# Patient Record
Sex: Female | Born: 1940 | Race: Black or African American | Hispanic: No | Marital: Married | Smoking: Never smoker
Health system: Southern US, Community
[De-identification: ages and names within clinical notes are randomized; demographics above are authoritative.]

## PROBLEM LIST (undated history)

## (undated) DIAGNOSIS — Z853 Personal history of malignant neoplasm of breast: Secondary | ICD-10-CM

## (undated) DIAGNOSIS — C801 Malignant (primary) neoplasm, unspecified: Secondary | ICD-10-CM

## (undated) DIAGNOSIS — D219 Benign neoplasm of connective and other soft tissue, unspecified: Secondary | ICD-10-CM

## (undated) DIAGNOSIS — I10 Essential (primary) hypertension: Secondary | ICD-10-CM

## (undated) DIAGNOSIS — J449 Chronic obstructive pulmonary disease, unspecified: Secondary | ICD-10-CM

## (undated) DIAGNOSIS — E119 Type 2 diabetes mellitus without complications: Secondary | ICD-10-CM

## (undated) DIAGNOSIS — M899 Disorder of bone, unspecified: Secondary | ICD-10-CM

## (undated) DIAGNOSIS — M1990 Unspecified osteoarthritis, unspecified site: Secondary | ICD-10-CM

## (undated) DIAGNOSIS — M545 Low back pain: Secondary | ICD-10-CM

## (undated) DIAGNOSIS — K219 Gastro-esophageal reflux disease without esophagitis: Secondary | ICD-10-CM

## (undated) DIAGNOSIS — M7989 Other specified soft tissue disorders: Secondary | ICD-10-CM

## (undated) DIAGNOSIS — J309 Allergic rhinitis, unspecified: Secondary | ICD-10-CM

## (undated) DIAGNOSIS — E785 Hyperlipidemia, unspecified: Secondary | ICD-10-CM

## (undated) DIAGNOSIS — M949 Disorder of cartilage, unspecified: Secondary | ICD-10-CM

## (undated) DIAGNOSIS — R531 Weakness: Secondary | ICD-10-CM

## (undated) DIAGNOSIS — D649 Anemia, unspecified: Secondary | ICD-10-CM

## (undated) HISTORY — DX: Disorder of cartilage, unspecified: M94.9

## (undated) HISTORY — DX: Anemia, unspecified: D64.9

## (undated) HISTORY — DX: Chronic obstructive pulmonary disease, unspecified: J44.9

## (undated) HISTORY — DX: Benign neoplasm of connective and other soft tissue, unspecified: D21.9

## (undated) HISTORY — DX: Malignant (primary) neoplasm, unspecified: C80.1

## (undated) HISTORY — PX: TUBAL LIGATION: SHX77

## (undated) HISTORY — PX: EYE SURGERY: SHX253

## (undated) HISTORY — DX: Disorder of bone, unspecified: M89.9

## (undated) HISTORY — DX: Low back pain: M54.5

## (undated) HISTORY — DX: Unspecified osteoarthritis, unspecified site: M19.90

## (undated) HISTORY — DX: Essential (primary) hypertension: I10

## (undated) HISTORY — PX: COLONOSCOPY: SHX174

## (undated) HISTORY — PX: OTHER SURGICAL HISTORY: SHX169

## (undated) HISTORY — DX: Allergic rhinitis, unspecified: J30.9

## (undated) HISTORY — DX: Other specified soft tissue disorders: M79.89

## (undated) HISTORY — DX: Type 2 diabetes mellitus without complications: E11.9

## (undated) HISTORY — DX: Personal history of malignant neoplasm of breast: Z85.3

## (undated) HISTORY — DX: Hyperlipidemia, unspecified: E78.5

## (undated) HISTORY — DX: Weakness: R53.1

---

## 1986-06-30 HISTORY — PX: OOPHORECTOMY: SHX86

## 1986-06-30 HISTORY — PX: ABDOMINAL HYSTERECTOMY: SHX81

## 1997-12-04 ENCOUNTER — Other Ambulatory Visit: Admission: RE | Admit: 1997-12-04 | Discharge: 1997-12-04 | Payer: Self-pay | Admitting: Obstetrics and Gynecology

## 1998-05-31 ENCOUNTER — Encounter: Admission: RE | Admit: 1998-05-31 | Discharge: 1998-08-29 | Payer: Self-pay | Admitting: Family Medicine

## 1998-08-18 ENCOUNTER — Emergency Department (HOSPITAL_COMMUNITY): Admission: EM | Admit: 1998-08-18 | Discharge: 1998-08-18 | Payer: Self-pay | Admitting: Emergency Medicine

## 1999-06-13 ENCOUNTER — Encounter: Admission: RE | Admit: 1999-06-13 | Discharge: 1999-09-11 | Payer: Self-pay | Admitting: Family Medicine

## 2001-10-08 ENCOUNTER — Other Ambulatory Visit: Admission: RE | Admit: 2001-10-08 | Discharge: 2001-10-08 | Payer: Self-pay | Admitting: Obstetrics and Gynecology

## 2001-12-21 ENCOUNTER — Encounter: Payer: Self-pay | Admitting: Family Medicine

## 2001-12-21 ENCOUNTER — Encounter: Admission: RE | Admit: 2001-12-21 | Discharge: 2001-12-21 | Payer: Self-pay | Admitting: Family Medicine

## 2001-12-21 ENCOUNTER — Encounter: Admission: RE | Admit: 2001-12-21 | Discharge: 2002-01-27 | Payer: Self-pay | Admitting: Family Medicine

## 2003-04-14 ENCOUNTER — Other Ambulatory Visit: Admission: RE | Admit: 2003-04-14 | Discharge: 2003-04-14 | Payer: Self-pay | Admitting: Obstetrics and Gynecology

## 2004-08-30 ENCOUNTER — Other Ambulatory Visit: Admission: RE | Admit: 2004-08-30 | Discharge: 2004-08-30 | Payer: Self-pay | Admitting: Obstetrics and Gynecology

## 2005-06-30 HISTORY — PX: BREAST LUMPECTOMY: SHX2

## 2006-01-09 ENCOUNTER — Emergency Department (HOSPITAL_COMMUNITY): Admission: EM | Admit: 2006-01-09 | Discharge: 2006-01-10 | Payer: Self-pay | Admitting: Emergency Medicine

## 2006-05-14 ENCOUNTER — Encounter: Admission: RE | Admit: 2006-05-14 | Discharge: 2006-05-14 | Payer: Self-pay | Admitting: Radiology

## 2006-06-04 ENCOUNTER — Ambulatory Visit (HOSPITAL_COMMUNITY): Admission: RE | Admit: 2006-06-04 | Discharge: 2006-06-04 | Payer: Self-pay | Admitting: General Surgery

## 2006-06-04 ENCOUNTER — Encounter (INDEPENDENT_AMBULATORY_CARE_PROVIDER_SITE_OTHER): Payer: Self-pay | Admitting: Specialist

## 2006-06-18 ENCOUNTER — Ambulatory Visit: Payer: Self-pay | Admitting: Oncology

## 2006-06-30 DIAGNOSIS — C50911 Malignant neoplasm of unspecified site of right female breast: Secondary | ICD-10-CM

## 2006-06-30 HISTORY — DX: Malignant neoplasm of unspecified site of right female breast: C50.911

## 2006-07-09 LAB — CBC WITH DIFFERENTIAL/PLATELET
BASO%: 0.3 % (ref 0.0–2.0)
Eosinophils Absolute: 0.2 10*3/uL (ref 0.0–0.5)
LYMPH%: 33.3 % (ref 14.0–48.0)
MCHC: 33.9 g/dL (ref 32.0–36.0)
MONO#: 0.7 10*3/uL (ref 0.1–0.9)
NEUT#: 3.1 10*3/uL (ref 1.5–6.5)
Platelets: 186 10*3/uL (ref 145–400)
RBC: 4.01 10*6/uL (ref 3.70–5.32)
RDW: 16.6 % — ABNORMAL HIGH (ref 11.3–14.5)
WBC: 6 10*3/uL (ref 3.9–10.0)
lymph#: 2 10*3/uL (ref 0.9–3.3)

## 2006-07-09 LAB — COMPREHENSIVE METABOLIC PANEL
ALT: 13 U/L (ref 0–35)
Albumin: 4 g/dL (ref 3.5–5.2)
Alkaline Phosphatase: 112 U/L (ref 39–117)
CO2: 26 mEq/L (ref 19–32)
Glucose, Bld: 119 mg/dL — ABNORMAL HIGH (ref 70–99)
Potassium: 3.8 mEq/L (ref 3.5–5.3)
Sodium: 139 mEq/L (ref 135–145)
Total Bilirubin: 0.6 mg/dL (ref 0.3–1.2)
Total Protein: 7.6 g/dL (ref 6.0–8.3)

## 2006-07-09 LAB — IRON AND TIBC
Iron: 56 ug/dL (ref 42–145)
UIBC: 269 ug/dL

## 2006-07-09 LAB — LACTATE DEHYDROGENASE: LDH: 202 U/L (ref 94–250)

## 2006-07-09 LAB — CANCER ANTIGEN 27.29: CA 27.29: 16 U/mL (ref 0–39)

## 2006-07-14 ENCOUNTER — Ambulatory Visit: Admission: RE | Admit: 2006-07-14 | Discharge: 2006-09-30 | Payer: Self-pay | Admitting: Radiation Oncology

## 2006-07-16 ENCOUNTER — Ambulatory Visit (HOSPITAL_COMMUNITY): Admission: RE | Admit: 2006-07-16 | Discharge: 2006-07-16 | Payer: Self-pay | Admitting: Oncology

## 2006-07-29 LAB — CBC WITH DIFFERENTIAL/PLATELET
BASO%: 0.5 % (ref 0.0–2.0)
Eosinophils Absolute: 0.2 10*3/uL (ref 0.0–0.5)
HCT: 33.5 % — ABNORMAL LOW (ref 34.8–46.6)
LYMPH%: 30.1 % (ref 14.0–48.0)
MCHC: 33.8 g/dL (ref 32.0–36.0)
MCV: 79.7 fL — ABNORMAL LOW (ref 81.0–101.0)
MONO%: 11.3 % (ref 0.0–13.0)
NEUT%: 55.8 % (ref 39.6–76.8)
Platelets: 181 10*3/uL (ref 145–400)
RBC: 4.2 10*6/uL (ref 3.70–5.32)

## 2006-07-29 LAB — RESEARCH LABS

## 2006-09-22 ENCOUNTER — Ambulatory Visit: Payer: Self-pay | Admitting: Oncology

## 2006-09-24 LAB — COMPREHENSIVE METABOLIC PANEL
ALT: 13 U/L (ref 0–35)
AST: 10 U/L (ref 0–37)
Albumin: 3.9 g/dL (ref 3.5–5.2)
Alkaline Phosphatase: 113 U/L (ref 39–117)
Potassium: 4.2 mEq/L (ref 3.5–5.3)
Sodium: 140 mEq/L (ref 135–145)
Total Protein: 7.8 g/dL (ref 6.0–8.3)

## 2006-09-24 LAB — CBC WITH DIFFERENTIAL/PLATELET
BASO%: 0.4 % (ref 0.0–2.0)
EOS%: 2.6 % (ref 0.0–7.0)
Eosinophils Absolute: 0.1 10*3/uL (ref 0.0–0.5)
MCH: 27.5 pg (ref 26.0–34.0)
MCHC: 34.8 g/dL (ref 32.0–36.0)
MCV: 79.1 fL — ABNORMAL LOW (ref 81.0–101.0)
MONO%: 12.7 % (ref 0.0–13.0)
NEUT#: 3.5 10*3/uL (ref 1.5–6.5)
RBC: 4.02 10*6/uL (ref 3.70–5.32)
RDW: 16.6 % — ABNORMAL HIGH (ref 11.3–14.5)

## 2006-12-14 ENCOUNTER — Ambulatory Visit: Payer: Self-pay | Admitting: Oncology

## 2006-12-17 LAB — COMPREHENSIVE METABOLIC PANEL
AST: 11 U/L (ref 0–37)
Albumin: 3.9 g/dL (ref 3.5–5.2)
Alkaline Phosphatase: 108 U/L (ref 39–117)
BUN: 29 mg/dL — ABNORMAL HIGH (ref 6–23)
Potassium: 4 mEq/L (ref 3.5–5.3)
Sodium: 139 mEq/L (ref 135–145)
Total Bilirubin: 0.5 mg/dL (ref 0.3–1.2)

## 2006-12-17 LAB — CBC WITH DIFFERENTIAL/PLATELET
EOS%: 2.6 % (ref 0.0–7.0)
LYMPH%: 18.3 % (ref 14.0–48.0)
MCH: 27.4 pg (ref 26.0–34.0)
MCV: 78.8 fL — ABNORMAL LOW (ref 81.0–101.0)
MONO%: 10.4 % (ref 0.0–13.0)
RBC: 4.02 10*6/uL (ref 3.70–5.32)
RDW: 17 % — ABNORMAL HIGH (ref 11.3–14.5)

## 2007-01-29 ENCOUNTER — Ambulatory Visit: Payer: Self-pay | Admitting: Gastroenterology

## 2007-02-09 ENCOUNTER — Ambulatory Visit: Payer: Self-pay | Admitting: Gastroenterology

## 2007-04-13 ENCOUNTER — Ambulatory Visit: Payer: Self-pay | Admitting: Oncology

## 2007-04-16 LAB — CBC & DIFF AND RETIC
Basophils Absolute: 0 10*3/uL (ref 0.0–0.1)
Eosinophils Absolute: 0.2 10*3/uL (ref 0.0–0.5)
HCT: 30.9 % — ABNORMAL LOW (ref 34.8–46.6)
HGB: 10.8 g/dL — ABNORMAL LOW (ref 11.6–15.9)
IRF: 0.44 — ABNORMAL HIGH (ref 0.130–0.330)
MONO#: 0.5 10*3/uL (ref 0.1–0.9)
NEUT#: 3.9 10*3/uL (ref 1.5–6.5)
NEUT%: 67.2 % (ref 39.6–76.8)
WBC: 5.8 10*3/uL (ref 3.9–10.0)
lymph#: 1.2 10*3/uL (ref 0.9–3.3)

## 2007-04-16 LAB — CHCC SMEAR

## 2007-04-26 LAB — COMPREHENSIVE METABOLIC PANEL
ALT: 10 U/L (ref 0–35)
AST: 11 U/L (ref 0–37)
Albumin: 3.8 g/dL (ref 3.5–5.2)
Alkaline Phosphatase: 105 U/L (ref 39–117)
Calcium: 9 mg/dL (ref 8.4–10.5)
Chloride: 103 mEq/L (ref 96–112)
Creatinine, Ser: 0.89 mg/dL (ref 0.40–1.20)
Potassium: 4.1 mEq/L (ref 3.5–5.3)

## 2007-04-26 LAB — HEMOGLOBINOPATHY EVALUATION
Hemoglobin Other: 40.1 % — ABNORMAL HIGH (ref 0.0–0.0)
Hgb F Quant: 0 % (ref 0.0–2.0)

## 2007-04-26 LAB — HGB ELECTROPHORESIS
Hgb C: 0
Hgb E Quant: 0

## 2007-04-29 ENCOUNTER — Other Ambulatory Visit: Admission: RE | Admit: 2007-04-29 | Discharge: 2007-04-29 | Payer: Self-pay | Admitting: Obstetrics and Gynecology

## 2007-08-17 ENCOUNTER — Ambulatory Visit: Payer: Self-pay | Admitting: Oncology

## 2007-08-19 LAB — COMPREHENSIVE METABOLIC PANEL
AST: 11 U/L (ref 0–37)
Albumin: 3.7 g/dL (ref 3.5–5.2)
BUN: 25 mg/dL — ABNORMAL HIGH (ref 6–23)
CO2: 26 mEq/L (ref 19–32)
Calcium: 9.2 mg/dL (ref 8.4–10.5)
Chloride: 102 mEq/L (ref 96–112)
Creatinine, Ser: 0.88 mg/dL (ref 0.40–1.20)
Glucose, Bld: 197 mg/dL — ABNORMAL HIGH (ref 70–99)
Potassium: 3.9 mEq/L (ref 3.5–5.3)

## 2007-08-19 LAB — CBC & DIFF AND RETIC
BASO%: 0.4 % (ref 0.0–2.0)
EOS%: 2.7 % (ref 0.0–7.0)
IRF: 0.5 — ABNORMAL HIGH (ref 0.130–0.330)
MCH: 27.5 pg (ref 26.0–34.0)
MCHC: 35.3 g/dL (ref 32.0–36.0)
RDW: 17.5 % — ABNORMAL HIGH (ref 11.3–14.5)
RETIC #: 106 10*3/uL (ref 19.7–115.1)
lymph#: 1 10*3/uL (ref 0.9–3.3)

## 2007-08-19 LAB — LACTATE DEHYDROGENASE: LDH: 194 U/L (ref 94–250)

## 2007-08-19 LAB — MORPHOLOGY: PLT EST: ADEQUATE

## 2007-08-19 LAB — CANCER ANTIGEN 27.29: CA 27.29: 17 U/mL (ref 0–39)

## 2007-11-02 ENCOUNTER — Encounter: Admission: RE | Admit: 2007-11-02 | Discharge: 2007-11-02 | Payer: Self-pay | Admitting: Orthopedic Surgery

## 2008-02-13 ENCOUNTER — Ambulatory Visit: Payer: Self-pay | Admitting: Oncology

## 2008-02-16 LAB — CBC WITH DIFFERENTIAL/PLATELET
Eosinophils Absolute: 0.1 10*3/uL (ref 0.0–0.5)
MONO#: 0.6 10*3/uL (ref 0.1–0.9)
NEUT#: 4.3 10*3/uL (ref 1.5–6.5)
RBC: 3.99 10*6/uL (ref 3.70–5.32)
RDW: 17.7 % — ABNORMAL HIGH (ref 11.3–14.5)
WBC: 6.4 10*3/uL (ref 3.9–10.0)

## 2008-02-17 LAB — COMPREHENSIVE METABOLIC PANEL
ALT: 10 U/L (ref 0–35)
Albumin: 3.8 g/dL (ref 3.5–5.2)
Alkaline Phosphatase: 110 U/L (ref 39–117)
CO2: 26 mEq/L (ref 19–32)
Glucose, Bld: 225 mg/dL — ABNORMAL HIGH (ref 70–99)
Potassium: 3.6 mEq/L (ref 3.5–5.3)
Sodium: 139 mEq/L (ref 135–145)
Total Protein: 7.5 g/dL (ref 6.0–8.3)

## 2008-09-01 ENCOUNTER — Ambulatory Visit: Payer: Self-pay | Admitting: Oncology

## 2008-09-05 LAB — CBC WITH DIFFERENTIAL/PLATELET
BASO%: 0.3 % (ref 0.0–2.0)
Basophils Absolute: 0 10*3/uL (ref 0.0–0.1)
EOS%: 2.7 % (ref 0.0–7.0)
HCT: 31.5 % — ABNORMAL LOW (ref 34.8–46.6)
HGB: 10.6 g/dL — ABNORMAL LOW (ref 11.6–15.9)
MCHC: 33.5 g/dL (ref 31.5–36.0)
MONO#: 0.7 10*3/uL (ref 0.1–0.9)
NEUT%: 69.4 % (ref 38.4–76.8)
RDW: 17.4 % — ABNORMAL HIGH (ref 11.2–14.5)
WBC: 7.1 10*3/uL (ref 3.9–10.3)
lymph#: 1.3 10*3/uL (ref 0.9–3.3)

## 2008-09-06 LAB — CANCER ANTIGEN 27.29: CA 27.29: 24 U/mL (ref 0–39)

## 2008-09-06 LAB — COMPREHENSIVE METABOLIC PANEL
CO2: 26 mEq/L (ref 19–32)
Creatinine, Ser: 0.92 mg/dL (ref 0.40–1.20)
Glucose, Bld: 197 mg/dL — ABNORMAL HIGH (ref 70–99)
Total Bilirubin: 0.5 mg/dL (ref 0.3–1.2)

## 2008-09-06 LAB — VITAMIN D 25 HYDROXY (VIT D DEFICIENCY, FRACTURES): Vit D, 25-Hydroxy: 12 ng/mL — ABNORMAL LOW (ref 30–89)

## 2008-09-06 LAB — LACTATE DEHYDROGENASE: LDH: 202 U/L (ref 94–250)

## 2008-12-21 ENCOUNTER — Encounter: Payer: Self-pay | Admitting: Obstetrics and Gynecology

## 2008-12-21 ENCOUNTER — Other Ambulatory Visit: Admission: RE | Admit: 2008-12-21 | Discharge: 2008-12-21 | Payer: Self-pay | Admitting: Obstetrics and Gynecology

## 2008-12-21 ENCOUNTER — Ambulatory Visit: Payer: Self-pay | Admitting: Obstetrics and Gynecology

## 2009-03-22 ENCOUNTER — Ambulatory Visit: Payer: Self-pay | Admitting: Oncology

## 2009-03-27 LAB — CBC WITH DIFFERENTIAL/PLATELET
BASO%: 0.3 % (ref 0.0–2.0)
EOS%: 2.2 % (ref 0.0–7.0)
HCT: 31.8 % — ABNORMAL LOW (ref 34.8–46.6)
LYMPH%: 16.2 % (ref 14.0–49.7)
MCH: 27.7 pg (ref 25.1–34.0)
MCHC: 34.2 g/dL (ref 31.5–36.0)
MONO%: 9.1 % (ref 0.0–14.0)
NEUT%: 72.2 % (ref 38.4–76.8)
Platelets: 141 10*3/uL — ABNORMAL LOW (ref 145–400)
lymph#: 1 10*3/uL (ref 0.9–3.3)

## 2009-03-27 LAB — COMPREHENSIVE METABOLIC PANEL
AST: 17 U/L (ref 0–37)
Alkaline Phosphatase: 100 U/L (ref 39–117)
BUN: 18 mg/dL (ref 6–23)
Creatinine, Ser: 0.92 mg/dL (ref 0.40–1.20)
Glucose, Bld: 244 mg/dL — ABNORMAL HIGH (ref 70–99)

## 2009-03-28 LAB — VITAMIN D 25 HYDROXY (VIT D DEFICIENCY, FRACTURES): Vit D, 25-Hydroxy: 40 ng/mL (ref 30–89)

## 2009-03-28 LAB — CANCER ANTIGEN 27.29: CA 27.29: 27 U/mL (ref 0–39)

## 2009-09-21 ENCOUNTER — Ambulatory Visit: Payer: Self-pay | Admitting: Oncology

## 2009-09-25 LAB — CBC WITH DIFFERENTIAL/PLATELET
BASO%: 0.8 % (ref 0.0–2.0)
EOS%: 2.1 % (ref 0.0–7.0)
MCH: 27.7 pg (ref 25.1–34.0)
MCV: 82.3 fL (ref 79.5–101.0)
MONO%: 9.9 % (ref 0.0–14.0)
RBC: 4.15 10*6/uL (ref 3.70–5.45)
RDW: 17 % — ABNORMAL HIGH (ref 11.2–14.5)
lymph#: 1.6 10*3/uL (ref 0.9–3.3)

## 2009-09-25 LAB — CANCER ANTIGEN 27.29: CA 27.29: 24 U/mL (ref 0–39)

## 2009-09-25 LAB — COMPREHENSIVE METABOLIC PANEL
AST: 12 U/L (ref 0–37)
BUN: 19 mg/dL (ref 6–23)
CO2: 25 mEq/L (ref 19–32)
Calcium: 9.1 mg/dL (ref 8.4–10.5)
Chloride: 101 mEq/L (ref 96–112)
Creatinine, Ser: 0.94 mg/dL (ref 0.40–1.20)
Total Bilirubin: 0.7 mg/dL (ref 0.3–1.2)

## 2009-09-25 LAB — LACTATE DEHYDROGENASE: LDH: 172 U/L (ref 94–250)

## 2010-04-16 ENCOUNTER — Encounter: Payer: Self-pay | Admitting: Internal Medicine

## 2010-05-22 ENCOUNTER — Encounter: Payer: Self-pay | Admitting: Internal Medicine

## 2010-06-06 ENCOUNTER — Encounter: Payer: Self-pay | Admitting: Internal Medicine

## 2010-06-17 ENCOUNTER — Ambulatory Visit: Payer: Self-pay | Admitting: Internal Medicine

## 2010-06-17 DIAGNOSIS — M949 Disorder of cartilage, unspecified: Secondary | ICD-10-CM

## 2010-06-17 DIAGNOSIS — E119 Type 2 diabetes mellitus without complications: Secondary | ICD-10-CM

## 2010-06-17 DIAGNOSIS — M899 Disorder of bone, unspecified: Secondary | ICD-10-CM | POA: Insufficient documentation

## 2010-06-17 DIAGNOSIS — D649 Anemia, unspecified: Secondary | ICD-10-CM

## 2010-06-17 DIAGNOSIS — J4489 Other specified chronic obstructive pulmonary disease: Secondary | ICD-10-CM | POA: Insufficient documentation

## 2010-06-17 DIAGNOSIS — Z853 Personal history of malignant neoplasm of breast: Secondary | ICD-10-CM | POA: Insufficient documentation

## 2010-06-17 DIAGNOSIS — J449 Chronic obstructive pulmonary disease, unspecified: Secondary | ICD-10-CM

## 2010-06-17 DIAGNOSIS — D509 Iron deficiency anemia, unspecified: Secondary | ICD-10-CM | POA: Insufficient documentation

## 2010-06-17 DIAGNOSIS — M1990 Unspecified osteoarthritis, unspecified site: Secondary | ICD-10-CM

## 2010-06-17 DIAGNOSIS — E785 Hyperlipidemia, unspecified: Secondary | ICD-10-CM

## 2010-06-17 DIAGNOSIS — J309 Allergic rhinitis, unspecified: Secondary | ICD-10-CM | POA: Insufficient documentation

## 2010-06-17 DIAGNOSIS — E1129 Type 2 diabetes mellitus with other diabetic kidney complication: Secondary | ICD-10-CM | POA: Insufficient documentation

## 2010-06-17 DIAGNOSIS — M545 Low back pain, unspecified: Secondary | ICD-10-CM | POA: Insufficient documentation

## 2010-06-17 DIAGNOSIS — I10 Essential (primary) hypertension: Secondary | ICD-10-CM | POA: Insufficient documentation

## 2010-06-17 HISTORY — DX: Unspecified osteoarthritis, unspecified site: M19.90

## 2010-06-17 HISTORY — DX: Hyperlipidemia, unspecified: E78.5

## 2010-06-17 HISTORY — DX: Type 2 diabetes mellitus without complications: E11.9

## 2010-06-17 HISTORY — DX: Disorder of bone, unspecified: M89.9

## 2010-06-17 HISTORY — DX: Chronic obstructive pulmonary disease, unspecified: J44.9

## 2010-06-17 HISTORY — DX: Essential (primary) hypertension: I10

## 2010-06-17 HISTORY — DX: Personal history of malignant neoplasm of breast: Z85.3

## 2010-06-17 HISTORY — DX: Anemia, unspecified: D64.9

## 2010-06-17 HISTORY — DX: Low back pain, unspecified: M54.50

## 2010-06-17 HISTORY — DX: Allergic rhinitis, unspecified: J30.9

## 2010-06-17 HISTORY — DX: Other specified chronic obstructive pulmonary disease: J44.89

## 2010-06-17 LAB — CONVERTED CEMR LAB: Hgb A1c MFr Bld: 7.4 % — ABNORMAL HIGH (ref 4.6–6.5)

## 2010-06-20 ENCOUNTER — Encounter: Payer: Self-pay | Admitting: Internal Medicine

## 2010-06-26 ENCOUNTER — Telehealth: Payer: Self-pay | Admitting: Internal Medicine

## 2010-07-11 ENCOUNTER — Ambulatory Visit
Admission: RE | Admit: 2010-07-11 | Discharge: 2010-07-11 | Payer: Self-pay | Source: Home / Self Care | Attending: Internal Medicine | Admitting: Internal Medicine

## 2010-07-11 LAB — CONVERTED CEMR LAB: Blood Glucose, Fingerstick: 205

## 2010-07-26 ENCOUNTER — Telehealth: Payer: Self-pay | Admitting: Internal Medicine

## 2010-07-31 ENCOUNTER — Telehealth: Payer: Self-pay | Admitting: *Deleted

## 2010-07-31 NOTE — Telephone Encounter (Signed)
Pt is calling to inform us that her BSs are running 150-160 in the am, and 198-200 q hs. Pt is only taking one Metformin 1000 mg q hs.  Needs advice

## 2010-07-31 NOTE — Telephone Encounter (Signed)
Called pt - last Friday pt was having problems with diarrhea - was instructed to decrease med to qhs - has noted some improvement. Discussed diet - restricting carbs , sugars, drink water - no fruit juices. Will see if Dr. Amador Cunas want to rx something else or want her to try going back to bid. It may be tomorrow AM before I call back . Verbalized understanding. KIK

## 2010-07-31 NOTE — Telephone Encounter (Signed)
Ask patient to increase the metformin to a 1000 mg b.i.d. Regimen; Continue tracking  Blood sugars.  Suggest return office visit in approximately 4 weeks.

## 2010-08-01 NOTE — Assessment & Plan Note (Signed)
Summary: BRAND NEW PT/TO EST/CJR/PT RSC/CJR   Vital Signs:  Patient profile:   70 year old female Height:      62 inches Weight:      278 pounds BMI:     51.03 Temp:     97.6 degrees F oral BP sitting:   128 / 74  (left arm) Cuff size:   large  Vitals Entered By: Duard Brady LPN (June 17, 2010 1:05 PM) CC: new to establish   diabetic concerns   fbs-140 Is Patient Diabetic? Yes Did you bring your meter with you today? No   CC:  new to establish   diabetic concerns   fbs-140.  History of Present Illness: 47 old patient who is seen today to establish with is in for a health maintenance examinationand.  Here for Medicare AWV:  1.   Risk factors based on Past M, S, F history:risk factors include hypertension diabetes, and dyslipidemia 2.   Physical Activities: fairly sedentary due to arthritis and morbid obesity 3.   Depression/mood: no history depression or mood disorder 4.   Hearing: no deficits 5.   ADL's: independent in all aspects of daily living 6.   Fall Risk: moderate 7.   Home Safety: no problems identified 8.   Height, weight, &visual acuity:height and weight stable.  No change in visual acuity.  Recent eye exam revealed no diabetic retinopathy and mild lenticular opacities 9.   Counseling: weight loss, heart healthy diet, exercise regimen discussed 10.   Labs ordered based on risk factors: will check hemoglobin A1C 11.           Referral Coordination- follow-up oncology biannually 12.           Care Plan- heart healthy salt restricted diet and exercise regimen encouraged 13.            Cognitive Assessment- alert and oriented, with normal affect.  No history cognitive dysfunction    Preventive Screening-Counseling & Management  Alcohol-Tobacco     Smoking Status: never  Caffeine-Diet-Exercise     Does Patient Exercise: no  Allergies (verified): No Known Drug Allergies  Past History:  Past Medical History: Anemia-NOS Breast cancer, hx of- Dr.  Donnie Coffin COPD Diabetes mellitus, type II Hyperlipidemia Hypertension Low back pain Osteoarthritis-Dr. Despina Hick  Osteopenia Allergic rhinitis- Pesotum allergy   Past Surgical History: Hysterectomy 1988 Mastectomy 2007 colonoscopy.  2008 2010  Family History: Reviewed history and no changes required. details of her father's health unclear mother died in her early complications of diabetes one brother died of traumatic death one brother 4 sisters in good health  Social History: Reviewed history and no changes required. Married Never Smoked Regular exercise-no Smoking Status:  never Does Patient Exercise:  no  Review of Systems       The patient complains of weight gain.  The patient denies anorexia, fever, weight loss, vision loss, decreased hearing, hoarseness, chest pain, syncope, dyspnea on exertion, peripheral edema, prolonged cough, headaches, hemoptysis, abdominal pain, melena, hematochezia, severe indigestion/heartburn, hematuria, incontinence, genital sores, muscle weakness, suspicious skin lesions, transient blindness, difficulty walking, depression, unusual weight change, abnormal bleeding, enlarged lymph nodes, angioedema, and breast masses.    Physical Exam  General:  overweight-appearing.  140/80 Eyes:  No corneal or conjunctival inflammation noted. EOMI. Perrla. Funduscopic exam benign, without hemorrhages, exudates or papilledema. Vision grossly normal. Ears:  External ear exam shows no significant lesions or deformities.  Otoscopic examination reveals clear canals, tympanic membranes are intact bilaterally without bulging, retraction, inflammation  or discharge. Hearing is grossly normal bilaterally. Mouth:  Oral mucosa and oropharynx without lesions or exudates.  Teeth in good repair. Neck:  No deformities, masses, or tenderness noted. Chest Wall:  No deformities, masses, or tenderness noted. Lungs:  Normal respiratory effort, chest expands symmetrically. Lungs are  clear to auscultation, no crackles or wheezes. Heart:  Normal rate and regular rhythm. S1 and S2 normal without gallop, murmur, click, rub or other extra sounds. Abdomen:  Bowel sounds positive,abdomen soft and non-tender without masses, organomegaly or hernias noted. Msk:  No deformity or scoliosis noted of thoracic or lumbar spine.   Pulses:  dorsalis pedis pulses intact.  Posterior tibial pulses nonpalpable Extremities:  No clubbing, cyanosis, edema, or deformity noted with normal full range of motion of all joints.   Neurologic:  alert & oriented X3, cranial nerves II-XII intact, DTRs symmetrical and normal, and finger-to-nose normal.   Skin:  mild  stasis dermatitis of lower extremities Cervical Nodes:  No lymphadenopathy noted Axillary Nodes:  No palpable lymphadenopathy Inguinal Nodes:  No significant adenopathy Psych:  Cognition and judgment appear intact. Alert and cooperative with normal attention span and concentration. No apparent delusions, illusions, hallucinations  Diabetes Management Exam:    Eye Exam:       Eye Exam done here today          Results: normal   Impression & Recommendations:  Problem # 1:  Preventive Health Care (ICD-V70.0)  Orders: Medicare -1st Annual Wellness Visit [phone])  Complete Medication List: 1)  Tessalon Perles 100 Mg Caps (Benzonatate) .... Three times a day as needed 2)  Pravastatin Sodium 40 Mg Tabs (Pravastatin sodium) .Marland Kitchen.. 1 at bedtime 3)  Glipizide 10 Mg Tabs (Glipizide) .... Two times a day 4)  Toprol Xl 50 Mg Xr24h-tab (Metoprolol succinate) .Marland Kitchen.. 1 every morning 5)  Letrozole 2.5 Mg Tabs (Letrozole) .Marland Kitchen.. 1 every morning 6)  Benicar 40 Mg Tabs (Olmesartan medoxomil) .Marland Kitchen.. 1 every morning 7)  Hydrochlorothiazide 25 Mg Tabs (Hydrochlorothiazide) .Marland Kitchen.. 1 every morning 8)  Januvia 100 Mg Tabs (Sitagliptin phosphate) .Marland Kitchen.. 1 every  morning 9)  Multivitamins Tabs (Multiple vitamin) .... Q day 10)  Vitamin D 2000 Unit Tabs (Cholecalciferol)  .... Q day 11)  Magnesium 250 Mg Tabs (Magnesium) .... Q day 12)  B-12 2000 Mcg Tabs (Cyanocobalamin) .... Q day 13)  Calcium-d 600-200 Mg-unit Tabs (Calcium carbonate-vitamin d) .... Q day 14)  Tramadol Hcl 50 Mg Tabs (Tramadol hcl) .... One every 6 hours as needed for pain 15)  Metformin Hcl 500 Mg Xr24h-tab (Metformin hcl) .... Two at bedtime  Other Orders: Venipuncture (60454) TLB-A1C / Hgb A1C (Glycohemoglobin) (83036-A1C)  Patient Instructions: 1)  Please schedule a follow-up appointment in 3 months. 2)  Limit your Sodium (Salt). 3)  It is important that you exercise regularly at least 20 minutes 5 times a week. If you develop chest pain, have severe difficulty breathing, or feel very tired , stop exercising immediately and seek medical attention. 4)  You need to lose weight. Consider a lower calorie diet and regular exercise.  5)  Check your blood sugars regularly. If your readings are usually above : or below 70 you should contact our office. 6)  It is important that your Diabetic A1c level is checked every 3 months. Prescriptions: METFORMIN HCL 500 MG XR24H-TAB (METFORMIN HCL) two at bedtime  #180 x 6   Entered and Authorized by:   Gordy Savers  MD   Signed by:   Theron Arista  Lysle Dingwall  MD on 06/17/2010   Method used:   Print then Give to Patient   RxID:   [id number] JANUVIA 100 MG TABS (SITAGLIPTIN PHOSPHATE) 1 every  morning  #90 x 6   Entered and Authorized by:   Gordy Savers  MD   Signed by:   Gordy Savers  MD on 06/17/2010   Method used:   Print then Give to Patient   RxID:   [id number] HYDROCHLOROTHIAZIDE 25 MG TABS (HYDROCHLOROTHIAZIDE) 1 every morning  #90 x 6   Entered and Authorized by:   Gordy Savers  MD   Signed by:   Gordy Savers  MD on 06/17/2010   Method used:   Print then Give to Patient   RxID:   [id number] BENICAR 40 MG TABS (OLMESARTAN MEDOXOMIL) 1 every morning  #90 x 6   Entered and Authorized by:    Gordy Savers  MD   Signed by:   Gordy Savers  MD on 06/17/2010   Method used:   Print then Give to Patient   RxID:   [id number] TOPROL XL 50 MG XR24H-TAB (METOPROLOL SUCCINATE) 1 every morning  #90 x 6   Entered and Authorized by:   Gordy Savers  MD   Signed by:   Gordy Savers  MD on 06/17/2010   Method used:   Print then Give to Patient   RxID:   [id number] GLIPIZIDE 10 MG TABS (GLIPIZIDE) two times a day  #180 x 6   Entered and Authorized by:   Gordy Savers  MD   Signed by:   Gordy Savers  MD on 06/17/2010   Method used:   Print then Give to Patient   RxID:   [id number] PRAVASTATIN SODIUM 40 MG TABS (PRAVASTATIN SODIUM) 1 at bedtime  #90 x 6   Entered and Authorized by:   Gordy Savers  MD   Signed by:   Gordy Savers  MD on 06/17/2010   Method used:   Print then Give to Patient   RxID:   [id number]    Orders Added: 1)  Medicare -1st Annual Wellness Visit [G0438] 2)  Est. Patient Level III [69485] 3)  Venipuncture [46270] 4)  TLB-A1C / Hgb A1C (Glycohemoglobin) [83036-A1C]   Immunization History:  Influenza Immunization History:    Influenza:  historical (04/02/2010)   Immunization History:  Influenza Immunization History:    Influenza:  Historical (04/02/2010)   Appended Document: Office Visit ([facility])      Allergies: No Known Drug Allergies     Complete Medication List: 1)  Tessalon Perles 100 Mg Caps (Benzonatate) .... Three times a day as needed 2)  Pravastatin Sodium 40 Mg Tabs (Pravastatin sodium) .Marland Kitchen.. 1 at bedtime 3)  Glipizide 10 Mg Tabs (Glipizide) .... Two times a day 4)  Toprol Xl 50 Mg Xr24h-tab (Metoprolol succinate) .Marland Kitchen.. 1 every morning 5)  Letrozole 2.5 Mg Tabs (Letrozole) .Marland Kitchen.. 1 every morning 6)  Benicar 40 Mg Tabs (Olmesartan medoxomil) .Marland Kitchen.. 1 every morning 7)  Hydrochlorothiazide 25 Mg Tabs (Hydrochlorothiazide) .Marland Kitchen.. 1 every morning 8)  Januvia 100 Mg  Tabs (Sitagliptin phosphate) .Marland Kitchen.. 1 every  morning 9)  Multivitamins Tabs (Multiple vitamin) .... Q day 10)  Vitamin D 2000 Unit Tabs (Cholecalciferol) .... Q day 11)  Magnesium 250 Mg Tabs (Magnesium) .... Q day 12)  B-12 2000 Mcg Tabs (Cyanocobalamin) .... Q day 13)  Calcium-d 600-200 Mg-unit Tabs (Calcium carbonate-vitamin d) .Marland KitchenMarland KitchenMarland Kitchen  Q day 14)  Tramadol Hcl 50 Mg Tabs (Tramadol hcl) .... One every 6 hours as needed for pain 15)  Metformin Hcl 500 Mg Xr24h-tab (Metformin hcl) .... Two at bedtime  Other Orders: Specimen Handling (16109)  ]

## 2010-08-01 NOTE — Letter (Signed)
Summary: PATIENT HX FORMS  PATIENT HX FORMS   Imported By: Georgian Co 06/20/2010 13:46:48  _____________________________________________________________________  External Attachment:    Type:   Image     Comment:   External Document

## 2010-08-01 NOTE — Letter (Signed)
Summary: Records from [facility] 2010 - 2011  Records from [facility] 2010 - 2011   Imported By: Maryln Gottron 07/10/2010 12:22:21  _____________________________________________________________________  External Attachment:    Type:   Image     Comment:   External Document

## 2010-08-01 NOTE — Letter (Signed)
Summary: Diabetic Eye Exam/[facility]  Diabetic Eye Exam/[facility]   Imported By: Maryln Gottron 06/25/2010 13:48:42  _____________________________________________________________________  External Attachment:    Type:   Image     Comment:   External Document

## 2010-08-01 NOTE — Progress Notes (Signed)
Summary: taking metformin and has diarrhea  Phone Note Call from Patient Call back at Home Phone [phone] Call back at [phone]   Caller: Patient Call For: k Summary of Call: pt is currently taking metformin and she has diarrhea Initial call taken by: Alfred Levins, CMA,  July 26, 2010 3:56 PM  Follow-up for Phone Call        suggested she decrease her metformin to 1000 mg at bedtime only.  This was her prior dose that she seemed to tolerate.  Call early next week if GI symptoms persist Follow-up by: Gordy Savers  MD,  July 26, 2010 4:25 PM  Additional Follow-up for Phone Call Additional follow up Details #1::        spoke with pt - corrected cell # [phone]  informed what to do - if not improve - to see next wk per Dr. Frederica Kuster Additional Follow-up by: Duard Brady LPN,  July 26, 2010 4:37 PM

## 2010-08-01 NOTE — Telephone Encounter (Signed)
Spoke with pt - informed to keep monitoring BS thru weekend. Watch diet and portion control. Diarrhea improving still. If sugars high or diarrhea returns - call next week to be evaled. KIK

## 2010-08-01 NOTE — Assessment & Plan Note (Signed)
Summary: elevated BSs/dm   Vital Signs:  Patient profile:   70 year old female Weight:      275 pounds Temp:     97.6 degrees F oral BP sitting:   130 / 70  (left arm) Cuff size:   large  Vitals Entered By: Duard Brady LPN (July 11, 2010 10:45 AM) CC: c/o elevated blood sugars   fbs 170 Is Patient Diabetic? Yes Did you bring your meter with you today? No CBG Result 205   CC:  c/o elevated blood sugars   fbs 170.  History of Present Illness: 70 -year-old patient who has a history of type 2 diabetes.  She was seen for her initial exam about one month ago and had been controlled on oral anti-diabetic medications.  She apparently has never been on metformin and this was started at a dose of 1000 mg daily and Actos discontinued.  She feels that her blood sugars had trended up.  Hemoglobin A1c1 month ago, was 7.4.  She states it was 7.2 earlier in the summer.  She also feels that she does not tolerate the metformin well with headaches, dizziness, and vivid dreams.  No GI side effects  Allergies (verified): No Known Drug Allergies  Physical Exam  General:  overweight-appearing.  130/70overweight-appearing.     Impression & Recommendations:  Problem # 1:  HYPERTENSION (ICD-401.9)  Her updated medication list for this problem includes:    Toprol Xl 50 Mg Xr24h-tab (Metoprolol succinate) .Marland Kitchen... 1 every morning    Benicar 40 Mg Tabs (Olmesartan medoxomil) .Marland Kitchen... 1 every morning    Hydrochlorothiazide 25 Mg Tabs (Hydrochlorothiazide) .Marland Kitchen... 1 every morning  Her updated medication list for this problem includes:    Toprol Xl 50 Mg Xr24h-tab (Metoprolol succinate) .Marland Kitchen... 1 every morning    Benicar 40 Mg Tabs (Olmesartan medoxomil) .Marland Kitchen... 1 every morning    Hydrochlorothiazide 25 Mg Tabs (Hydrochlorothiazide) .Marland Kitchen... 1 every morning  Problem # 2:  DIABETES MELLITUS, TYPE II (ICD-250.00)  The following medications were removed from the medication list:    Metformin Hcl 500 Mg  Xr24h-tab (Metformin hcl) .Marland Kitchen..Marland Kitchen Two at bedtime Her updated medication list for this problem includes:    Glipizide 10 Mg Tabs (Glipizide) .Marland Kitchen..Marland Kitchen Two times a day    Benicar 40 Mg Tabs (Olmesartan medoxomil) .Marland Kitchen... 1 every morning    Januvia 100 Mg Tabs (Sitagliptin phosphate) .Marland Kitchen... 1 every  morning    Metformin Hcl 1000 Mg Tabs (Metformin hcl) ..... One twice daily  Orders: Capillary Blood Glucose/CBG (19147)  The following medications were removed from the medication list:    Metformin Hcl 500 Mg Xr24h-tab (Metformin hcl) .Marland Kitchen..Marland Kitchen Two at bedtime Her updated medication list for this problem includes:    Glipizide 10 Mg Tabs (Glipizide) .Marland Kitchen..Marland Kitchen Two times a day    Benicar 40 Mg Tabs (Olmesartan medoxomil) .Marland Kitchen... 1 every morning    Januvia 100 Mg Tabs (Sitagliptin phosphate) .Marland Kitchen... 1 every  morning    Metformin Hcl 1000 Mg Tabs (Metformin hcl) ..... One twice daily  Complete Medication List: 1)  Tessalon Perles 100 Mg Caps (Benzonatate) .... Three times a day as needed 2)  Pravastatin Sodium 40 Mg Tabs (Pravastatin sodium) .Marland Kitchen.. 1 at bedtime 3)  Glipizide 10 Mg Tabs (Glipizide) .... Two times a day 4)  Toprol Xl 50 Mg Xr24h-tab (Metoprolol succinate) .Marland Kitchen.. 1 every morning 5)  Letrozole 2.5 Mg Tabs (Letrozole) .Marland Kitchen.. 1 every morning 6)  Benicar 40 Mg Tabs (Olmesartan medoxomil) .Marland Kitchen.. 1 every morning  7)  Hydrochlorothiazide 25 Mg Tabs (Hydrochlorothiazide) .Marland Kitchen.. 1 every morning 8)  Januvia 100 Mg Tabs (Sitagliptin phosphate) .Marland Kitchen.. 1 every  morning 9)  Multivitamins Tabs (Multiple vitamin) .... Q day 10)  Vitamin D 2000 Unit Tabs (Cholecalciferol) .... Q day 11)  Magnesium 250 Mg Tabs (Magnesium) .... Q day 12)  B-12 2000 Mcg Tabs (Cyanocobalamin) .... Q day 13)  Calcium-d 600-200 Mg-unit Tabs (Calcium carbonate-vitamin d) .... Q day 14)  Tramadol Hcl 50 Mg Tabs (Tramadol hcl) .... One every 6 hours as needed for pain 15)  Metformin Hcl 1000 Mg Tabs (Metformin hcl) .... One twice daily  Patient  Instructions: 1)  Please schedule a follow-up appointment in 1 month. 2)  Limit your Sodium (Salt) to less than 2 grams a day(slightly less than 1/2 a teaspoon) to prevent fluid retention, swelling, or worsening of symptoms. 3)  It is important that you exercise regularly at least 20 minutes 5 times a week. If you develop chest pain, have severe difficulty breathing, or feel very tired , stop exercising immediately and seek medical attention. 4)  You need to lose weight. Consider a lower calorie diet and regular exercise.  5)  Check your blood sugars regularly. If your readings are usually above : or below 70 you should contact our office. 6)  It is important that your Diabetic A1c level is checked every 3 months. Prescriptions: METFORMIN HCL 1000 MG TABS (METFORMIN HCL) one twice daily  #180 x 3   Entered and Authorized by:   Gordy Savers  MD   Signed by:   Gordy Savers  MD on 07/11/2010   Method used:   Print then Give to Patient   RxID:   [id number]    Orders Added: 1)  Capillary Blood Glucose/CBG [74259] 2)  Est. Patient Level III [56387]

## 2010-08-01 NOTE — Telephone Encounter (Signed)
Continue metformin once daily at Least  through the weekend and continue checking blood sugars; Have patient check in next week for consideration of a change in her medical regimen

## 2010-08-01 NOTE — Letter (Signed)
Summary: [facility]   Imported By: Maryln Gottron 07/10/2010 12:23:51  _____________________________________________________________________  External Attachment:    Type:   Image     Comment:   External Document

## 2010-08-01 NOTE — Progress Notes (Signed)
  Phone Note Call from Patient   Caller: Patient Call For: Gordy Savers  MD Summary of Call: Pt' s FBS are running 170-180.  Cannot take the Metformin because of the headache.   Only taking one q HS. [phone] [facility]) Initial call taken by: Lynann Beaver CMA AAMA,  June 26, 2010 9:27 AM  Follow-up for Phone Call        try metformin two times a day  Follow-up by: Gordy Savers  MD,  June 26, 2010 1:33 PM  Additional Follow-up for Phone Call Additional follow up Details #1::        Notified pt.  DOES not like Metformin. Additional Follow-up by: Lynann Beaver CMA AAMA,  June 26, 2010 2:03 PM

## 2010-08-06 ENCOUNTER — Telehealth: Payer: Self-pay | Admitting: *Deleted

## 2010-08-06 ENCOUNTER — Encounter: Payer: Self-pay | Admitting: Internal Medicine

## 2010-08-06 NOTE — Telephone Encounter (Signed)
Pt is still concerned about elevated BS and appt given tomorrow.

## 2010-08-07 ENCOUNTER — Encounter: Payer: Self-pay | Admitting: Internal Medicine

## 2010-08-07 ENCOUNTER — Ambulatory Visit (INDEPENDENT_AMBULATORY_CARE_PROVIDER_SITE_OTHER): Payer: Medicare Other | Admitting: Internal Medicine

## 2010-08-07 DIAGNOSIS — E119 Type 2 diabetes mellitus without complications: Secondary | ICD-10-CM

## 2010-08-07 DIAGNOSIS — I10 Essential (primary) hypertension: Secondary | ICD-10-CM

## 2010-08-07 DIAGNOSIS — E785 Hyperlipidemia, unspecified: Secondary | ICD-10-CM

## 2010-08-07 MED ORDER — COLESEVELAM HCL 625 MG PO TABS: 1875.0000 mg | ORAL_TABLET | Freq: Two times a day (BID) | ORAL | Status: DC

## 2010-08-07 MED ORDER — PRAVASTATIN SODIUM 40 MG PO TABS: 40.0000 mg | ORAL_TABLET | Freq: Every day | ORAL | Status: DC

## 2010-08-07 MED ORDER — METFORMIN HCL ER 500 MG PO TB24: ORAL_TABLET | ORAL | Status: DC

## 2010-08-07 NOTE — Progress Notes (Signed)
  Subjective:    Patient ID: Doris Jackson, female    DOB: 10-25-1940, 70 y.o.   MRN: [id number]  HPI  70 year old patient who is seen today for follow up of her type 2 diabetes.  She has been intolerant of high-dose metformin and has decreased.  Her metformin to 1000 mg at bedtime.  She still has some mild diarrhea, but has improved greatly.  She has dyslipidemia, controlled on pravastatin.  She has treated hypertension, on Benicar 40 mg daily blood sugars have remained elevated, especially the reduction of the Metformin dose.  Medical regimen includes generic, pravastatin, as well as metformin.  She is also  on Januvia.Review of Systems  Constitutional: Negative.   HENT: Negative for hearing loss, congestion, sore throat, rhinorrhea, dental problem, sinus pressure and tinnitus.   Eyes: Negative for pain, discharge and visual disturbance.  Respiratory: Negative for cough and shortness of breath.   Cardiovascular: Negative for chest pain, palpitations and leg swelling.  Gastrointestinal: Positive for diarrhea. Negative for nausea, vomiting, abdominal pain, constipation, blood in stool and abdominal distention.  Genitourinary: Negative for dysuria, urgency, frequency, hematuria, flank pain, vaginal bleeding, vaginal discharge, difficulty urinating, vaginal pain and pelvic pain.  Musculoskeletal: Negative for joint swelling, arthralgias and gait problem.  Skin: Negative for rash.  Neurological: Negative for dizziness, syncope, speech difficulty, weakness, numbness and headaches.  Hematological: Negative for adenopathy. Does not bruise/bleed easily.  Psychiatric/Behavioral: Negative for behavioral problems, dysphoric mood and agitation. The patient is not nervous/anxious.        Objective:   Physical Exam  Constitutional: She appears well-developed and well-nourished. No distress.       obese  HENT:  Mouth/Throat: Oropharynx is clear and moist.  Eyes: Conjunctivae and EOM are normal. Pupils are  equal, round, and reactive to light.  Neck: Normal range of motion.  Cardiovascular: Normal rate, regular rhythm and normal heart sounds.   Pulmonary/Chest: Effort normal and breath sounds normal.  Abdominal: Soft. Bowel sounds are normal. She exhibits no distension. There is no tenderness. There is no rebound and no guarding.          Assessment & Plan:  Diabetes-Sub  optimal l control-  Will add WelChol, and  Hopefully  will and prove the loose stool, As well as improved glycemic control; will change metformin to extended release, which may also help Hypertension stable Dyslipidemia, stable

## 2010-08-07 NOTE — Patient Instructions (Signed)
welchol - 2  Daily for one week, Return in one month for follow-up then 4 daily for one week, then 6 daily Return in one month for follow-up

## 2010-08-20 ENCOUNTER — Telehealth: Payer: Self-pay | Admitting: *Deleted

## 2010-08-20 NOTE — Telephone Encounter (Signed)
BSs are running 140--157 Welchol is difficult for her to swallow. Copayment is 45.00

## 2010-08-20 NOTE — Telephone Encounter (Signed)
Pt to pick up samples from Dr. Kirtland Bouchard.

## 2010-08-20 NOTE — Telephone Encounter (Signed)
OK to drop by for samples and coupons

## 2010-08-21 ENCOUNTER — Other Ambulatory Visit: Payer: Self-pay | Admitting: *Deleted

## 2010-08-21 NOTE — Telephone Encounter (Signed)
Notified pt. 

## 2010-08-21 NOTE — Telephone Encounter (Signed)
It is for both diabetes and cholesterl lowering

## 2010-08-21 NOTE — Telephone Encounter (Signed)
Pt is confused about her meds.  She thought the Welchol was for diabetes, and has been told it is chol lowering, and thinks there has been a mistake???

## 2010-09-13 ENCOUNTER — Encounter: Payer: Self-pay | Admitting: Internal Medicine

## 2010-09-16 ENCOUNTER — Ambulatory Visit (INDEPENDENT_AMBULATORY_CARE_PROVIDER_SITE_OTHER): Payer: Medicare Other | Admitting: Internal Medicine

## 2010-09-16 ENCOUNTER — Ambulatory Visit: Payer: Self-pay | Admitting: Internal Medicine

## 2010-09-16 ENCOUNTER — Encounter: Payer: Self-pay | Admitting: Internal Medicine

## 2010-09-16 DIAGNOSIS — E119 Type 2 diabetes mellitus without complications: Secondary | ICD-10-CM

## 2010-09-16 DIAGNOSIS — E785 Hyperlipidemia, unspecified: Secondary | ICD-10-CM

## 2010-09-16 DIAGNOSIS — I10 Essential (primary) hypertension: Secondary | ICD-10-CM

## 2010-09-16 DIAGNOSIS — M1990 Unspecified osteoarthritis, unspecified site: Secondary | ICD-10-CM

## 2010-09-16 DIAGNOSIS — J309 Allergic rhinitis, unspecified: Secondary | ICD-10-CM

## 2010-09-16 NOTE — Patient Instructions (Signed)
Limit your sodium (Salt) intake   Please check your hemoglobin A1c every 3 months    It is important that you exercise regularly, at least 20 minutes 3 to 4 times per week.  If you develop chest pain or shortness of breath seek  medical attention.  You need to lose weight.  Consider a lower calorie diet and regular exercise. 

## 2010-09-16 NOTE — Progress Notes (Signed)
  Subjective:    Patient ID: Doris Jackson, female    DOB: February 15, 1941, 70 y.o.   MRN: [id number]  HPI Wt Readings from Last 3 Encounters:  09/16/10 266 lb (120.657 kg)  08/07/10 269 lb (122.018 kg)  07/11/10 275 lb (124.739 kg)     Review of Systems     Objective:   Physical Exam        Assessment & Plan:

## 2010-09-16 NOTE — Progress Notes (Signed)
  Subjective:    Patient ID: Doris Jackson, female    DOB: Mar 22, 1941, 70 y.o.   MRN: [id number]  HPI   70 year old patient who is seen today for followup of her type 2 diabetes. There's been some modest weight loss over the past 3 months she is on maximal oral medications. Her last hemoglobin A1c was 7.4 . She has hypertension which has been controlled on hydrochlorothiazide  and metformin. She has dyslipidemia and remains on pravastatin. No concerns or complaints. Does have remote history of right breast cancer. Denies any cardiopulmonary complaints    Review of Systems  Constitutional: Negative.   HENT: Negative for hearing loss, congestion, sore throat, rhinorrhea, dental problem, sinus pressure and tinnitus.   Eyes: Negative for pain, discharge and visual disturbance.  Respiratory: Negative for cough and shortness of breath.   Cardiovascular: Negative for chest pain, palpitations and leg swelling.  Gastrointestinal: Negative for nausea, vomiting, abdominal pain, diarrhea, constipation, blood in stool and abdominal distention.  Genitourinary: Negative for dysuria, urgency, frequency, hematuria, flank pain, vaginal bleeding, vaginal discharge, difficulty urinating, vaginal pain and pelvic pain.  Musculoskeletal: Negative for joint swelling, arthralgias and gait problem.  Skin: Negative for rash.  Neurological: Negative for dizziness, syncope, speech difficulty, weakness, numbness and headaches.  Hematological: Negative for adenopathy.  Psychiatric/Behavioral: Negative for behavioral problems, dysphoric mood and agitation. The patient is not nervous/anxious.        Objective:   Physical Exam  Constitutional: She is oriented to person, place, and time. She appears well-developed and well-nourished.  HENT:  Head: Normocephalic.  Right Ear: External ear normal.  Left Ear: External ear normal.  Mouth/Throat: Oropharynx is clear and moist.  Eyes: Conjunctivae and EOM are normal. Pupils  are equal, round, and reactive to light.  Neck: Normal range of motion. Neck supple. No thyromegaly present.  Cardiovascular: Normal rate, regular rhythm, normal heart sounds and intact distal pulses.   Pulmonary/Chest: Effort normal and breath sounds normal.  Abdominal: Soft. Bowel sounds are normal. She exhibits no mass. There is no tenderness.  Musculoskeletal: Normal range of motion.  Lymphadenopathy:    She has no cervical adenopathy.  Neurological: She is alert and oriented to person, place, and time.  Skin: Skin is warm and dry. No rash noted.  Psychiatric: She has a normal mood and affect. Her behavior is normal.          Assessment & Plan:   diabetes mellitus type 2 we'll check a hemoglobin A1c that is a weight loss exercise all encouraged.  Hypertension well controlled. Will continue her efforts at weight loss exercise and restricted sodium diet  Dyslipidemia. Continue pravastatin 40 mg daily  Allergic rhinitis. Samples of Allegra dispensed.

## 2010-09-27 ENCOUNTER — Other Ambulatory Visit: Payer: Self-pay | Admitting: Oncology

## 2010-09-27 ENCOUNTER — Encounter (HOSPITAL_BASED_OUTPATIENT_CLINIC_OR_DEPARTMENT_OTHER): Payer: Medicare Other | Admitting: Oncology

## 2010-09-27 DIAGNOSIS — C50419 Malignant neoplasm of upper-outer quadrant of unspecified female breast: Secondary | ICD-10-CM

## 2010-09-27 LAB — CBC WITH DIFFERENTIAL/PLATELET
BASO%: 0.2 % (ref 0.0–2.0)
Basophils Absolute: 0 10*3/uL (ref 0.0–0.1)
EOS%: 2.5 % (ref 0.0–7.0)
HCT: 33.3 % — ABNORMAL LOW (ref 34.8–46.6)
MCH: 27 pg (ref 25.1–34.0)
MCHC: 34 g/dL (ref 31.5–36.0)
MCV: 79.5 fL (ref 79.5–101.0)
MONO#: 0.9 10*3/uL (ref 0.1–0.9)
MONO%: 13 % (ref 0.0–14.0)
Platelets: 148 10*3/uL (ref 145–400)

## 2010-09-28 LAB — COMPREHENSIVE METABOLIC PANEL
ALT: 23 U/L (ref 0–35)
Albumin: 3.9 g/dL (ref 3.5–5.2)
Alkaline Phosphatase: 103 U/L (ref 39–117)
BUN: 21 mg/dL (ref 6–23)
Chloride: 105 mEq/L (ref 96–112)
Glucose, Bld: 154 mg/dL — ABNORMAL HIGH (ref 70–99)
Total Protein: 7.2 g/dL (ref 6.0–8.3)

## 2010-09-28 LAB — VITAMIN D 25 HYDROXY (VIT D DEFICIENCY, FRACTURES): Vit D, 25-Hydroxy: 37 ng/mL (ref 30–89)

## 2010-10-03 ENCOUNTER — Encounter (HOSPITAL_BASED_OUTPATIENT_CLINIC_OR_DEPARTMENT_OTHER): Payer: Medicare Other | Admitting: Oncology

## 2010-10-03 DIAGNOSIS — I10 Essential (primary) hypertension: Secondary | ICD-10-CM

## 2010-10-03 DIAGNOSIS — C50419 Malignant neoplasm of upper-outer quadrant of unspecified female breast: Secondary | ICD-10-CM

## 2010-10-03 DIAGNOSIS — E119 Type 2 diabetes mellitus without complications: Secondary | ICD-10-CM

## 2010-10-03 DIAGNOSIS — E559 Vitamin D deficiency, unspecified: Secondary | ICD-10-CM

## 2010-10-05 ENCOUNTER — Emergency Department (HOSPITAL_COMMUNITY)
Admission: EM | Admit: 2010-10-05 | Discharge: 2010-10-05 | Disposition: A | Discharge status: Home / Self Care | Payer: Medicare Other | Attending: Emergency Medicine | Admitting: Emergency Medicine

## 2010-10-05 DIAGNOSIS — I10 Essential (primary) hypertension: Secondary | ICD-10-CM | POA: Insufficient documentation

## 2010-10-05 DIAGNOSIS — Z79899 Other long term (current) drug therapy: Secondary | ICD-10-CM | POA: Insufficient documentation

## 2010-10-05 DIAGNOSIS — M542 Cervicalgia: Secondary | ICD-10-CM | POA: Insufficient documentation

## 2010-10-05 DIAGNOSIS — S139XXA Sprain of joints and ligaments of unspecified parts of neck, initial encounter: Secondary | ICD-10-CM | POA: Insufficient documentation

## 2010-10-05 DIAGNOSIS — M129 Arthropathy, unspecified: Secondary | ICD-10-CM | POA: Insufficient documentation

## 2010-10-05 DIAGNOSIS — E119 Type 2 diabetes mellitus without complications: Secondary | ICD-10-CM | POA: Insufficient documentation

## 2010-10-05 DIAGNOSIS — X58XXXA Exposure to other specified factors, initial encounter: Secondary | ICD-10-CM | POA: Insufficient documentation

## 2010-10-05 DIAGNOSIS — Y929 Unspecified place or not applicable: Secondary | ICD-10-CM | POA: Insufficient documentation

## 2010-10-05 DIAGNOSIS — M62838 Other muscle spasm: Secondary | ICD-10-CM | POA: Insufficient documentation

## 2010-10-07 ENCOUNTER — Encounter: Payer: Self-pay | Admitting: Internal Medicine

## 2010-10-07 ENCOUNTER — Ambulatory Visit (INDEPENDENT_AMBULATORY_CARE_PROVIDER_SITE_OTHER): Payer: Medicare Other | Admitting: Internal Medicine

## 2010-10-07 DIAGNOSIS — I10 Essential (primary) hypertension: Secondary | ICD-10-CM

## 2010-10-07 DIAGNOSIS — M542 Cervicalgia: Secondary | ICD-10-CM

## 2010-10-07 DIAGNOSIS — M1990 Unspecified osteoarthritis, unspecified site: Secondary | ICD-10-CM

## 2010-10-07 DIAGNOSIS — E119 Type 2 diabetes mellitus without complications: Secondary | ICD-10-CM

## 2010-10-07 MED ORDER — OLMESARTAN-AMLODIPINE-HCTZ 40-5-25 MG PO TABS: 1.0000 | ORAL_TABLET | ORAL | Status: DC

## 2010-10-07 NOTE — Patient Instructions (Signed)
Return in one month for follow-up  Limit your sodium (Salt) intake   

## 2010-10-07 NOTE — Progress Notes (Signed)
  Subjective:    Patient ID: Doris Jackson, female    DOB: April 03, 1941, 70 y.o.   MRN: [id number]  HPI   70 year old patient who was seen following a ED visit for severe right lateral neck pain. She was placed on muscle relaxants and analgesics and has improved. Emergency  room records were reviewed. She was seen for followup of type 2 diabetes. A random blood sugar 104. Last hemoglobin A1c 7.4. She was also seen for followup of hypertension this has been consistently elevated both in the ER and with oncology followup. She has been compliant with her medications she has been on triple therapy including diuretic therapy beta blocker and Benicar. Review of Systems  Constitutional: Negative.   HENT: Negative for hearing loss, congestion, sore throat, rhinorrhea, dental problem, sinus pressure and tinnitus.   Eyes: Negative for pain, discharge and visual disturbance.  Respiratory: Negative for cough and shortness of breath.   Cardiovascular: Negative for chest pain, palpitations and leg swelling.  Gastrointestinal: Negative for nausea, vomiting, abdominal pain, diarrhea, constipation, blood in stool and abdominal distention.  Genitourinary: Negative for dysuria, urgency, frequency, hematuria, flank pain, vaginal bleeding, vaginal discharge, difficulty urinating, vaginal pain and pelvic pain.  Musculoskeletal: Negative for joint swelling, arthralgias and gait problem.       [Neck pain Skin: Negative for rash.  Neurological: Negative for dizziness, syncope, speech difficulty, weakness, numbness and headaches.  Hematological: Negative for adenopathy.  Psychiatric/Behavioral: Negative for behavioral problems, dysphoric mood and agitation. The patient is not nervous/anxious.        Objective:   Physical Exam  Constitutional: She appears well-nourished. No distress.       Obese. Blood pressure 140/100  Neck:       Decreased range of motion of the neck with some pain with head movement to the right           Assessment & Plan:  Neck pain. Improved. We'll continue short-term muscle relaxants and analgesic therapy. Heat  therapy discussed. If not improved we'll consider a soft cervical collar we'll consider a soft cervical collar Diabetes mellitus. Weight loss exercise all encouraged. Hemoglobin A1c is trending down. We'll continue present regimen Hypertension suboptimal control. Amlodipine 5 mg daily will be added to her regimen. We'll recheck in one month

## 2010-10-15 ENCOUNTER — Ambulatory Visit: Payer: Medicare Other | Admitting: Internal Medicine

## 2010-10-15 DIAGNOSIS — Z0000 Encounter for general adult medical examination without abnormal findings: Secondary | ICD-10-CM

## 2010-10-15 DIAGNOSIS — Z111 Encounter for screening for respiratory tuberculosis: Secondary | ICD-10-CM

## 2010-10-15 MED ORDER — TUBERCULIN PPD 5 UNIT/0.1ML ID SOLN
5.0000 [IU] | Freq: Once | INTRADERMAL | Status: AC
  Administered 2010-10-15: 5 [IU] via INTRADERMAL

## 2010-10-22 ENCOUNTER — Telehealth: Payer: Self-pay | Admitting: Internal Medicine

## 2010-10-22 NOTE — Telephone Encounter (Signed)
Spoke with pt - samples availb. - up front for pick up. KIK

## 2010-10-22 NOTE — Telephone Encounter (Signed)
Pt is req samples of Welchol 3.75 grams and also sample for Januvia 100 mg. Pt can not afford copay for script.

## 2010-11-12 ENCOUNTER — Encounter: Payer: Self-pay | Admitting: Internal Medicine

## 2010-11-12 ENCOUNTER — Ambulatory Visit (INDEPENDENT_AMBULATORY_CARE_PROVIDER_SITE_OTHER): Payer: Medicare Other | Admitting: Internal Medicine

## 2010-11-12 DIAGNOSIS — M1990 Unspecified osteoarthritis, unspecified site: Secondary | ICD-10-CM

## 2010-11-12 DIAGNOSIS — M545 Low back pain, unspecified: Secondary | ICD-10-CM

## 2010-11-12 DIAGNOSIS — I10 Essential (primary) hypertension: Secondary | ICD-10-CM

## 2010-11-12 DIAGNOSIS — E119 Type 2 diabetes mellitus without complications: Secondary | ICD-10-CM

## 2010-11-12 NOTE — Patient Instructions (Addendum)
'  lows    It is important that you exercise regularly, at least 20 minutes 3 to 4 times per week.  If you develop chest pain or shortness of breath seek  medical attention.  You need to lose weight.  Consider a lower calorie diet and regular exercise.  Return in 3 months for follow-up

## 2010-11-12 NOTE — Progress Notes (Signed)
  Subjective:    Patient ID: Doris Jackson, female    DOB: 08-12-1940, 70 y.o.   MRN: [id number]  HPI 70 year old patient who is seen today for followup of her hypertension. Amlodipine was added to her regimen one month ago due to the stage II hypertension she feels well on this medication. She has been using samples of Tribenzor. She has type 2 diabetes which has been stable. She is also requesting a letter for excuse from jury duty. She does have arthritis and chronic pain and has been on chronic narcotic therapy she feels that she is unable to participate due to her chronic pain obesity. 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 A letter will be written Review of Systems  Constitutional: Negative.   HENT: Negative for hearing loss, congestion, sore throat, rhinorrhea, dental problem, sinus pressure and tinnitus.   Eyes: Negative for pain, discharge and visual disturbance.  Respiratory: Negative for cough and shortness of breath.   Cardiovascular: Negative for chest pain, palpitations and leg swelling.  Gastrointestinal: Negative for nausea, vomiting, abdominal pain, diarrhea, constipation, blood in stool and abdominal distention.  Genitourinary: Negative for dysuria, urgency, frequency, hematuria, flank pain, vaginal bleeding, vaginal discharge, difficulty urinating, vaginal pain and pelvic pain.  Musculoskeletal: Negative for joint swelling, arthralgias and gait problem.  Skin: Negative for rash.  Neurological: Negative for dizziness, syncope, speech difficulty, weakness, numbness and headaches.  Hematological: Negative for adenopathy.  Psychiatric/Behavioral: Negative for behavioral problems, dysphoric mood and agitation. The patient is not nervous/anxious.        Objective:   Physical Exam  Constitutional: She appears well-developed and well-nourished. No distress.       Blood pressure 120/76          Assessment & Plan:   Hypertension improved Type 2  diabetes Osteoarthritis with chronic low back pain and chronic narcotic use. A letter to excuse from jury service was done and given to the patient

## 2010-11-15 NOTE — Op Note (Signed)
NAME:  Doris Jackson, Doris Jackson                 ACCOUNT NO.:  [id number]   MEDICAL RECORD NO.:  [id number]          PATIENT TYPE:  AMB   LOCATION:  SDS                          FACILITY:  [facility]   PHYSICIAN:  Gabrielle Dare. Janee Morn, M.D.DATE OF BIRTH:  [birth year]   DATE OF PROCEDURE:  06/04/2006  DATE OF DISCHARGE:  06/04/2006                               OPERATIVE REPORT   PREOPERATIVE DIAGNOSIS:  Right breast cancer.   POSTOPERATIVE DIAGNOSIS:  Right breast cancer.   PROCEDURES:  1. Right axillary sentinel lymph node biopsy.  2. Right needle-localized partial mastectomy.   SURGEON:  Gabrielle Dare. Janee Morn, M.D.   ANESTHESIA:  General.   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  She presents today for right axillary  sentinel lymph node biopsy and right needle-localized partial  mastectomy.   PROCEDURES IN DETAIL:  Informed consent was obtained.  The patient had  been injected by nuclear medicine.  Her site was marked.  She was  brought to the operating room.  Her needle localization was in place  from the [facility].  General anesthesia was administered.  Subsequently, the cutaneous area around her nipple was injected with 4  cc of a solution of methylene blue, which was 1 cc in 3 cc of saline.  The breast area was then measured for 4 minutes by the clock.  Her right  axilla and breast were then prepped and draped in a sterile fashion.  The Neoprobe was used to localize an area of hot signal.  A transverse  incision was made over the axilla.  Subcutaneous tissues were dissected  down.  The axillary contents were entered.  A hot blue node was easily  localized.  This was circumferentially dissected, taking care not to get  near the long thoracic or thoracodorsal nerves.  The node was brightly  blue and very hot, over 1000 on the Neoprobe count.  This was sent to  pathology as hot blue node.  The Neoprobe was used to scan the axilla  and no other significant readings were found.  Meticulous hemostasis was  obtained.  The area was irrigated and then the wound was closed.  Subcutaneous tissues were closed with running 3-0 Vicryl suture, and the  skin was closed with a running 4-0 Vicryl subcuticular stitch.  Attention was then directed to the needle-localized mastectomy.  An  elliptical incision was made over the top of the lesion as localized on  mammography, and this included the entrance site of the wire.  The  subcutaneous tissues were dissected down, and using the wire as  guidance, a wide excision was accomplished.  We went down to the fascia  beneath this lesion and got good wide margins.  Excellent hemostasis was  obtained both with Bovie cautery, and then with suture ligatures of 2-0  silk as needed.  The specimen was sent to [facility].  Mammography there confirmed that the lesion was well within our  specimen.  Please note that the specimen had been oriented for pathology  with silk sutures.  The wound was copiously irrigated.  Meticulous  hemostasis was ensured.  Subcutaneous tissues were closed with  interrupted 2-0 Vicryl sutures.  The wound was again irrigated and the  skin was closed with a running 4-  0 Vicryl subcuticular stitch.  Sponge, needle and instrument counts were  correct.  Benzoin and Steri-Strips and sterile dressings were applied.  The patient tolerated the procedure well without apparent complication,  and was taken to the recovery room in stable condition.      Gabrielle Dare Janee Morn, M.D.  Electronically Signed     BET/MEDQ  D:  06/04/2006  T:  06/05/2006  Job:  [id number]   cc:   Dr Verlon Au Arley Phenix, M.D. [facility]

## 2010-12-03 ENCOUNTER — Encounter (INDEPENDENT_AMBULATORY_CARE_PROVIDER_SITE_OTHER): Payer: Self-pay | Admitting: General Surgery

## 2010-12-17 ENCOUNTER — Ambulatory Visit: Payer: Medicare Other | Admitting: Internal Medicine

## 2010-12-25 ENCOUNTER — Ambulatory Visit (INDEPENDENT_AMBULATORY_CARE_PROVIDER_SITE_OTHER): Payer: Medicare Other | Admitting: Internal Medicine

## 2010-12-25 ENCOUNTER — Encounter: Payer: Self-pay | Admitting: Internal Medicine

## 2010-12-25 DIAGNOSIS — J449 Chronic obstructive pulmonary disease, unspecified: Secondary | ICD-10-CM

## 2010-12-25 DIAGNOSIS — J4489 Other specified chronic obstructive pulmonary disease: Secondary | ICD-10-CM

## 2010-12-25 DIAGNOSIS — M1990 Unspecified osteoarthritis, unspecified site: Secondary | ICD-10-CM

## 2010-12-25 DIAGNOSIS — I10 Essential (primary) hypertension: Secondary | ICD-10-CM

## 2010-12-25 DIAGNOSIS — E785 Hyperlipidemia, unspecified: Secondary | ICD-10-CM

## 2010-12-25 DIAGNOSIS — E119 Type 2 diabetes mellitus without complications: Secondary | ICD-10-CM

## 2010-12-25 DIAGNOSIS — Z853 Personal history of malignant neoplasm of breast: Secondary | ICD-10-CM

## 2010-12-25 LAB — MICROALBUMIN / CREATININE URINE RATIO: Microalb Creat Ratio: 0.5 mg/g (ref 0.0–30.0)

## 2010-12-25 LAB — HEMOGLOBIN A1C: Hgb A1c MFr Bld: 7.6 % — ABNORMAL HIGH (ref 4.6–6.5)

## 2010-12-25 NOTE — Patient Instructions (Signed)
Limit your sodium (Salt) intake  You need to lose weight.  Consider a lower calorie diet and regular exercise.    It is important that you exercise regularly, at least 20 minutes 3 to 4 times per week.  If you develop chest pain or shortness of breath seek  medical attention.  Return in 3 months for follow-up  

## 2010-12-25 NOTE — Progress Notes (Signed)
  Subjective:    Patient ID: Doris Jackson, female    DOB: [birth year], 70 y.o.   MRN: [id number]  HPI  70 year old patient who is seen today for followup of her type 2 diabetes. She is on maximal oral medication. Her last known A1c 7.5. Her weight is down modestly. She has a dyslipidemia osteoarthritis and is followed by oncology for breast cancer. 3 months ago she had extensive laboratory update. She has done well today. New concerns or complaints. She says blood sugars are generally in the 150 range fasting. She denies any cardiopulmonary complaints  Wt Readings from Last 3 Encounters:  12/25/10 256 lb (116.121 kg)  11/12/10 263 lb (119.296 kg)  10/07/10 266 lb (120.657 kg)      Review of Systems  Constitutional: Negative.   HENT: Negative for hearing loss, congestion, sore throat, rhinorrhea, dental problem, sinus pressure and tinnitus.   Eyes: Negative for pain, discharge and visual disturbance.  Respiratory: Negative for cough and shortness of breath.   Cardiovascular: Negative for chest pain, palpitations and leg swelling.  Gastrointestinal: Negative for nausea, vomiting, abdominal pain, diarrhea, constipation, blood in stool and abdominal distention.  Genitourinary: Negative for dysuria, urgency, frequency, hematuria, flank pain, vaginal bleeding, vaginal discharge, difficulty urinating, vaginal pain and pelvic pain.  Musculoskeletal: Positive for back pain. Negative for joint swelling, arthralgias and gait problem.  Skin: Negative for rash.  Neurological: Negative for dizziness, syncope, speech difficulty, weakness, numbness and headaches.  Hematological: Negative for adenopathy.  Psychiatric/Behavioral: Negative for behavioral problems, dysphoric mood and agitation. The patient is not nervous/anxious.        Objective:   Physical Exam  Constitutional: She is oriented to person, place, and time. She appears well-developed and well-nourished.       Morbidly obese. Blood pressure  110/80  HENT:  Head: Normocephalic.  Right Ear: External ear normal.  Left Ear: External ear normal.  Mouth/Throat: Oropharynx is clear and moist.  Eyes: Conjunctivae and EOM are normal. Pupils are equal, round, and reactive to light.  Neck: Normal range of motion. Neck supple. No thyromegaly present.  Cardiovascular: Normal rate, regular rhythm, normal heart sounds and intact distal pulses.   Pulmonary/Chest: Effort normal and breath sounds normal.  Abdominal: Soft. Bowel sounds are normal. She exhibits no mass. There is no tenderness.  Musculoskeletal: Normal range of motion.  Lymphadenopathy:    She has no cervical adenopathy.  Neurological: She is alert and oriented to person, place, and time.  Skin: Skin is warm and dry. No rash noted.  Psychiatric: She has a normal mood and affect. Her behavior is normal.          Assessment & Plan:   Type 2 diabetes. We'll check a hemoglobin A1c weight loss exercise encouraged. She does do low impact aerobics. Hypertension well controlled Osteoarthritis COPD Breast cancer. Per oncology

## 2011-02-03 ENCOUNTER — Telehealth: Payer: Self-pay | Admitting: Internal Medicine

## 2011-02-03 NOTE — Telephone Encounter (Signed)
Pt aware samples ready for pic up

## 2011-02-03 NOTE — Telephone Encounter (Signed)
Pt called 8/6. Would like some samples of her Tribenzor and Welchol. Please call pt when they are ready for pick up. She would like to pick it up Wednesday if she can.

## 2011-02-24 ENCOUNTER — Telehealth: Payer: Self-pay | Admitting: Internal Medicine

## 2011-02-24 NOTE — Telephone Encounter (Signed)
Pt req samples of Olmesartan-Amlodipine-HCTZ (TRIBENZOR) 40-5-25 and the powder form of thecolesevelam (WELCHOL) 625 MG. Pls notify pt when samples are ready for pick up.

## 2011-02-24 NOTE — Telephone Encounter (Signed)
Samples ready for pick up - needs to be seen next mos. Last seen 11/2010

## 2011-03-05 ENCOUNTER — Telehealth: Payer: Self-pay | Admitting: Internal Medicine

## 2011-03-05 NOTE — Telephone Encounter (Signed)
No samples avilb. KIK

## 2011-03-05 NOTE — Telephone Encounter (Signed)
Pt called req more samples of Olmesartan-Amlodipine-HCTZ (TRIBENZOR) 40-5-25 and the powder form of thecolesevelam (WELCHOL) 625 MG. Pt almost out.

## 2011-03-06 ENCOUNTER — Other Ambulatory Visit: Payer: Self-pay

## 2011-03-06 MED ORDER — COLESEVELAM HCL 3.75 G PO PACK: 1.0000 | PACK | Freq: Two times a day (BID) | ORAL | Status: DC

## 2011-03-10 ENCOUNTER — Other Ambulatory Visit: Payer: Self-pay | Admitting: Internal Medicine

## 2011-03-10 MED ORDER — COLESEVELAM HCL 3.75 G PO PACK: 3.7500 g | PACK | Freq: Every day | ORAL | Status: DC

## 2011-03-10 NOTE — Telephone Encounter (Signed)
Done

## 2011-03-10 NOTE — Telephone Encounter (Signed)
Pt called req to get a refill of powder form of  colesevelam (WELCHOL) 625 MG. [facility]s [facility] [address].

## 2011-03-14 ENCOUNTER — Encounter: Payer: Self-pay | Admitting: Internal Medicine

## 2011-03-14 ENCOUNTER — Ambulatory Visit (INDEPENDENT_AMBULATORY_CARE_PROVIDER_SITE_OTHER): Payer: Medicare Other | Admitting: Internal Medicine

## 2011-03-14 DIAGNOSIS — J4489 Other specified chronic obstructive pulmonary disease: Secondary | ICD-10-CM

## 2011-03-14 DIAGNOSIS — E119 Type 2 diabetes mellitus without complications: Secondary | ICD-10-CM

## 2011-03-14 DIAGNOSIS — E785 Hyperlipidemia, unspecified: Secondary | ICD-10-CM

## 2011-03-14 DIAGNOSIS — I10 Essential (primary) hypertension: Secondary | ICD-10-CM

## 2011-03-14 DIAGNOSIS — J449 Chronic obstructive pulmonary disease, unspecified: Secondary | ICD-10-CM

## 2011-03-14 NOTE — Progress Notes (Signed)
  Subjective:    Patient ID: Doris Jackson, female    DOB: April 09, 1941, 70 y.o.   MRN: [id number]  HPI  Wt Readings from Last 3 Encounters:  03/14/11 254 lb (115.214 kg)  12/25/10 256 lb (116.121 kg)  11/12/10 263 lb (119.54 kg)   70 year old patient who is seen today for followup of her diabetes. She is on oral medications. These include WelChol but states that she has to pay a $45 co-pay per month. Her last hemoglobin A1c 7.6. Her weight is trending down. She has multi-drug-resistant hypertension which has been well controlled on her present regimen. She has dyslipidemia controlled with Pravachol  Review of Systems  Constitutional: Negative.   HENT: Negative for hearing loss, congestion, sore throat, rhinorrhea, dental problem, sinus pressure and tinnitus.   Eyes: Negative for pain, discharge and visual disturbance.  Respiratory: Negative for cough and shortness of breath.   Cardiovascular: Negative for chest pain, palpitations and leg swelling.  Gastrointestinal: Negative for nausea, vomiting, abdominal pain, diarrhea, constipation, blood in stool and abdominal distention.  Genitourinary: Negative for dysuria, urgency, frequency, hematuria, flank pain, vaginal bleeding, vaginal discharge, difficulty urinating, vaginal pain and pelvic pain.  Musculoskeletal: Negative for joint swelling, arthralgias and gait problem.  Skin: Negative for rash.  Neurological: Negative for dizziness, syncope, speech difficulty, weakness, numbness and headaches.  Hematological: Negative for adenopathy.  Psychiatric/Behavioral: Negative for behavioral problems, dysphoric mood and agitation. The patient is not nervous/anxious.        Objective:   Physical Exam  Constitutional: She is oriented to person, place, and time. She appears well-developed and well-nourished.       Blood pressure 120/74 Weight 254  HENT:  Head: Normocephalic.  Right Ear: External ear normal.  Left Ear: External ear normal.    Mouth/Throat: Oropharynx is clear and moist.  Eyes: Conjunctivae and EOM are normal. Pupils are equal, round, and reactive to light.  Neck: Normal range of motion. Neck supple. No thyromegaly present.  Cardiovascular: Normal rate, regular rhythm, normal heart sounds and intact distal pulses.   Pulmonary/Chest: Effort normal and breath sounds normal.  Abdominal: Soft. Bowel sounds are normal. She exhibits no mass. There is no tenderness.  Musculoskeletal: Normal range of motion.  Lymphadenopathy:    She has no cervical adenopathy.  Neurological: She is alert and oriented to person, place, and time.  Skin: Skin is warm and dry. No rash noted.  Psychiatric: She has a normal mood and affect. Her behavior is normal.          Assessment & Plan:    Diabetes mellitus. We'll check a hemoglobin A1c next visit it has been less than 3 months at the present time.weight loss encouraged Hypertension well controlled

## 2011-03-14 NOTE — Patient Instructions (Signed)
Limit your sodium (Salt) intake   Please check your hemoglobin A1c every 3 months    It is important that you exercise regularly, at least 20 minutes 3 to 4 times per week.  If you develop chest pain or shortness of breath seek  medical attention.   

## 2011-03-26 ENCOUNTER — Other Ambulatory Visit: Payer: Self-pay | Admitting: Internal Medicine

## 2011-04-19 ENCOUNTER — Other Ambulatory Visit: Payer: Self-pay | Admitting: Internal Medicine

## 2011-04-21 NOTE — Telephone Encounter (Signed)
ok 

## 2011-04-21 NOTE — Telephone Encounter (Signed)
Not on med list please advise 

## 2011-04-29 ENCOUNTER — Telehealth: Payer: Self-pay | Admitting: Family Medicine

## 2011-04-29 NOTE — Telephone Encounter (Signed)
Patient is coming in tomorrow afternoon for her flu shot. She would like to pick up samples of Welchol powder & Tribenzor if possible. Thanks.

## 2011-04-30 ENCOUNTER — Ambulatory Visit (INDEPENDENT_AMBULATORY_CARE_PROVIDER_SITE_OTHER): Payer: Medicare Other

## 2011-04-30 DIAGNOSIS — Z23 Encounter for immunization: Secondary | ICD-10-CM

## 2011-04-30 NOTE — Telephone Encounter (Signed)
Samples placed up front for pick up.  

## 2011-05-12 LAB — HM MAMMOGRAPHY

## 2011-05-13 ENCOUNTER — Encounter: Payer: Self-pay | Admitting: Internal Medicine

## 2011-05-20 ENCOUNTER — Encounter (INDEPENDENT_AMBULATORY_CARE_PROVIDER_SITE_OTHER): Payer: Self-pay | Admitting: General Surgery

## 2011-05-23 ENCOUNTER — Telehealth: Payer: Self-pay | Admitting: Internal Medicine

## 2011-05-23 NOTE — Telephone Encounter (Signed)
Samples given.  

## 2011-05-23 NOTE — Telephone Encounter (Signed)
Requesting samples of Januvia 100mg  , liquid Wechol, and Tribenzor 40/5mg /25mg . Thanks.

## 2011-05-28 ENCOUNTER — Telehealth: Payer: Self-pay | Admitting: Oncology

## 2011-05-28 NOTE — Telephone Encounter (Signed)
called pt scheduled her appts for april2013.  pt asked that I mail appt to her home

## 2011-06-02 ENCOUNTER — Telehealth: Payer: Self-pay | Admitting: Internal Medicine

## 2011-06-02 MED ORDER — OLMESARTAN-AMLODIPINE-HCTZ 40-5-25 MG PO TABS: 1.0000 | ORAL_TABLET | Freq: Every day | ORAL | Status: DC

## 2011-06-02 NOTE — Telephone Encounter (Signed)
Do you recall the samples you gave her?

## 2011-06-02 NOTE — Telephone Encounter (Signed)
tribenzor- please refill

## 2011-06-02 NOTE — Telephone Encounter (Signed)
Was given samples of a new bp pill. ( pt does not know what it is). She is [facility]. Thanks.

## 2011-06-10 ENCOUNTER — Encounter (INDEPENDENT_AMBULATORY_CARE_PROVIDER_SITE_OTHER): Payer: Self-pay | Admitting: General Surgery

## 2011-06-25 ENCOUNTER — Encounter (INDEPENDENT_AMBULATORY_CARE_PROVIDER_SITE_OTHER): Payer: Self-pay | Admitting: General Surgery

## 2011-06-25 ENCOUNTER — Other Ambulatory Visit: Payer: Self-pay | Admitting: Internal Medicine

## 2011-06-25 ENCOUNTER — Ambulatory Visit (INDEPENDENT_AMBULATORY_CARE_PROVIDER_SITE_OTHER): Payer: Medicare Other | Admitting: General Surgery

## 2011-06-25 VITALS — BP 136/88 | HR 74 | Temp 97.6°F | Resp 18 | Ht 62.0 in | Wt 247.8 lb

## 2011-06-25 DIAGNOSIS — C50919 Malignant neoplasm of unspecified site of unspecified female breast: Secondary | ICD-10-CM

## 2011-06-25 NOTE — Telephone Encounter (Signed)
Samples for welchol and januvia only - none on tribenzor Tried multiple time to call hm# - rings busy Will try tomorrow

## 2011-06-25 NOTE — Telephone Encounter (Signed)
Pt need samples of januvia,tribenzor and wellchol

## 2011-06-25 NOTE — Progress Notes (Signed)
Subjective:     Patient ID: Doris Jackson, female   DOB: 08/26/1940, 70 y.o.   MRN: [id number]  HPI Patient presents for 6 month followup of right breast cancer. She is now 5 years out. She's been feeling fine. There following another abnormality in her left breast with a six-month recheck at the breast center. She continues to take her Femara.  Review of Systems     Objective:   Physical Exam  Neck: Normal range of motion. Neck supple.  Cardiovascular: Normal rate, regular rhythm and normal heart sounds.   Pulmonary/Chest: Effort normal and breath sounds normal.  Abdominal: Soft. She exhibits no distension. There is no tenderness.  Right breast has no palpable masses. Her old scar is healed without any nodularity or abnormality. Left breast has no palpable masses. Left axilla has no masses. There no nipple abnormalities bilaterally.     Assessment:     Stable long-term followup for breast cancer with no evidence of recurrence on exam    Plan:     Continue imaging surveillance at the breast center. She is also followed up with oncology. We will see her back as needed.

## 2011-06-26 NOTE — Telephone Encounter (Signed)
Pt aware samples up front for pickup. 

## 2011-07-08 ENCOUNTER — Ambulatory Visit (INDEPENDENT_AMBULATORY_CARE_PROVIDER_SITE_OTHER): Payer: Medicare Other | Admitting: Internal Medicine

## 2011-07-08 ENCOUNTER — Encounter: Payer: Self-pay | Admitting: Internal Medicine

## 2011-07-08 DIAGNOSIS — M1990 Unspecified osteoarthritis, unspecified site: Secondary | ICD-10-CM

## 2011-07-08 DIAGNOSIS — I10 Essential (primary) hypertension: Secondary | ICD-10-CM

## 2011-07-08 DIAGNOSIS — E785 Hyperlipidemia, unspecified: Secondary | ICD-10-CM

## 2011-07-08 DIAGNOSIS — E119 Type 2 diabetes mellitus without complications: Secondary | ICD-10-CM

## 2011-07-08 NOTE — Progress Notes (Signed)
  Subjective:    Patient ID: Doris Jackson, female    DOB: [birth year], 71 y.o.   MRN: [id number]  HPI  71 year old patient who has type 2 diabetes. She is on maximal oral medications. She is treated hypertension. She generally feels well today. She also has a history of dyslipidemia osteoarthritis and a remote history of breast cancer diagnosed approximately 5 years ago. She is doing well today. She has exogenous obesity    Review of Systems  Constitutional: Negative.   HENT: Negative for hearing loss, congestion, sore throat, rhinorrhea, dental problem, sinus pressure and tinnitus.   Eyes: Negative for pain, discharge and visual disturbance.  Respiratory: Negative for cough and shortness of breath.   Cardiovascular: Negative for chest pain, palpitations and leg swelling.  Gastrointestinal: Negative for nausea, vomiting, abdominal pain, diarrhea, constipation, blood in stool and abdominal distention.  Genitourinary: Negative for dysuria, urgency, frequency, hematuria, flank pain, vaginal bleeding, vaginal discharge, difficulty urinating, vaginal pain and pelvic pain.  Musculoskeletal: Negative for joint swelling, arthralgias and gait problem.  Skin: Negative for rash.  Neurological: Negative for dizziness, syncope, speech difficulty, weakness, numbness and headaches.  Hematological: Negative for adenopathy.  Psychiatric/Behavioral: Negative for behavioral problems, dysphoric mood and agitation. The patient is not nervous/anxious.        Objective:   Physical Exam  Constitutional: She is oriented to person, place, and time. She appears well-developed and well-nourished.       Obese. Weight 247. Blood pressure controlled  HENT:  Head: Normocephalic.  Right Ear: External ear normal.  Left Ear: External ear normal.  Mouth/Throat: Oropharynx is clear and moist.  Eyes: Conjunctivae and EOM are normal. Pupils are equal, round, and reactive to light.  Neck: Normal range of motion. Neck  supple. No thyromegaly present.  Cardiovascular: Normal rate, regular rhythm, normal heart sounds and intact distal pulses.   Pulmonary/Chest: Effort normal and breath sounds normal.  Abdominal: Soft. Bowel sounds are normal. She exhibits no mass. There is no tenderness.  Musculoskeletal: Normal range of motion.  Lymphadenopathy:    She has no cervical adenopathy.  Neurological: She is alert and oriented to person, place, and time.  Skin: Skin is warm and dry. No rash noted.  Psychiatric: She has a normal mood and affect. Her behavior is normal.          Assessment & Plan:    Diabetes mellitus. We'll check a hemoglobin A1c. Nonpharmacologic measures discussed Hypertension controlled samples provided Dyslipidemia. Will continue same Exogenous obesity   Recheck 3 months

## 2011-07-08 NOTE — Patient Instructions (Signed)
Please check your hemoglobin A1c every 3 months    It is important that you exercise regularly, at least 20 minutes 3 to 4 times per week.  If you develop chest pain or shortness of breath seek  medical attention.  You need to lose weight.  Consider a lower calorie diet and regular exercise. 

## 2011-07-11 LAB — HEMOGLOBIN A1C: Hgb A1c MFr Bld: 7.3 % — ABNORMAL HIGH (ref 4.6–6.5)

## 2011-07-17 ENCOUNTER — Telehealth: Payer: Self-pay

## 2011-07-17 NOTE — Telephone Encounter (Signed)
Attempt to call- recv'd form from [facility] for diab. Supplies and info on pt - need to know if using this company. KIK

## 2011-07-18 NOTE — Telephone Encounter (Signed)
Spoke with pt - she DOES use [facility] for diab supplies - ok to fill out form and send. KIK

## 2011-07-24 ENCOUNTER — Other Ambulatory Visit: Payer: Self-pay

## 2011-07-24 MED ORDER — GLIPIZIDE ER 10 MG PO TB24: 10.0000 mg | ORAL_TABLET | Freq: Two times a day (BID) | ORAL | Status: DC

## 2011-07-24 NOTE — Telephone Encounter (Signed)
Rx sent to pharmacy electronically.   

## 2011-09-01 ENCOUNTER — Telehealth: Payer: Self-pay | Admitting: *Deleted

## 2011-09-01 MED ORDER — GLUCOSE BLOOD VI STRP: ORAL_STRIP | Status: DC

## 2011-09-01 NOTE — Telephone Encounter (Signed)
Pt would like to have her HGBA1C results from January??

## 2011-09-01 NOTE — Telephone Encounter (Signed)
7.3- improved but not quite to goal which is less than 7.  Encourage more exercise and modest weight loss8

## 2011-09-01 NOTE — Telephone Encounter (Signed)
Pt is aware of lab results.    Pt states she needs Accu-check drums sent to [facility].

## 2011-09-01 NOTE — Telephone Encounter (Signed)
Drum ordered

## 2011-09-08 ENCOUNTER — Telehealth: Payer: Self-pay | Admitting: Internal Medicine

## 2011-09-08 NOTE — Telephone Encounter (Signed)
Pt aware samples up front for pickup. 

## 2011-09-08 NOTE — Telephone Encounter (Signed)
Pt req samples of Januvia and Welchol. Pt req to pick up this afternoon or tomorrow.

## 2011-09-11 ENCOUNTER — Telehealth: Payer: Self-pay | Admitting: Internal Medicine

## 2011-09-11 DIAGNOSIS — D219 Benign neoplasm of connective and other soft tissue, unspecified: Secondary | ICD-10-CM | POA: Insufficient documentation

## 2011-09-11 DIAGNOSIS — C801 Malignant (primary) neoplasm, unspecified: Secondary | ICD-10-CM | POA: Insufficient documentation

## 2011-09-11 DIAGNOSIS — N809 Endometriosis, unspecified: Secondary | ICD-10-CM | POA: Insufficient documentation

## 2011-09-11 MED ORDER — GLUCOSE BLOOD VI STRP: 1.0000 | ORAL_STRIP | Freq: Two times a day (BID) | Status: DC

## 2011-09-11 NOTE — Telephone Encounter (Signed)
Pt called to check on status of Accu-check drums that was suppose to have been sent to [facility]. Their phone # [phone]. Pt said that she req this back on 09/01/11 and pt has check with [facility] and no Accu-check has been ordered. Pls call in.   Also pt is req samples of Tribenzor tablet.

## 2011-09-11 NOTE — Telephone Encounter (Signed)
Spoke with pt- this test strip drum was sent on 3/4 - but i will resend - no samples aviabl.

## 2011-09-22 ENCOUNTER — Encounter: Payer: Self-pay | Admitting: Obstetrics and Gynecology

## 2011-09-22 ENCOUNTER — Other Ambulatory Visit (HOSPITAL_COMMUNITY)
Admission: RE | Admit: 2011-09-22 | Discharge: 2011-09-22 | Disposition: A | Discharge status: Home / Self Care | Payer: Medicare Other | Source: Ambulatory Visit | Attending: Obstetrics and Gynecology | Admitting: Obstetrics and Gynecology

## 2011-09-22 ENCOUNTER — Ambulatory Visit (INDEPENDENT_AMBULATORY_CARE_PROVIDER_SITE_OTHER): Payer: Medicare Other | Admitting: Obstetrics and Gynecology

## 2011-09-22 VITALS — BP 130/84 | Ht 62.0 in | Wt 244.0 lb

## 2011-09-22 DIAGNOSIS — N952 Postmenopausal atrophic vaginitis: Secondary | ICD-10-CM

## 2011-09-22 DIAGNOSIS — Z124 Encounter for screening for malignant neoplasm of cervix: Secondary | ICD-10-CM | POA: Insufficient documentation

## 2011-09-22 DIAGNOSIS — Z1272 Encounter for screening for malignant neoplasm of vagina: Secondary | ICD-10-CM

## 2011-09-22 DIAGNOSIS — N949 Unspecified condition associated with female genital organs and menstrual cycle: Secondary | ICD-10-CM

## 2011-09-22 DIAGNOSIS — D249 Benign neoplasm of unspecified breast: Secondary | ICD-10-CM

## 2011-09-22 DIAGNOSIS — M545 Low back pain, unspecified: Secondary | ICD-10-CM

## 2011-09-22 DIAGNOSIS — H269 Unspecified cataract: Secondary | ICD-10-CM | POA: Insufficient documentation

## 2011-09-22 DIAGNOSIS — C50919 Malignant neoplasm of unspecified site of unspecified female breast: Secondary | ICD-10-CM

## 2011-09-22 DIAGNOSIS — R102 Pelvic and perineal pain: Secondary | ICD-10-CM

## 2011-09-22 NOTE — Progress Notes (Signed)
Patient came back to see me today for further followup. She has been having lower back pain and lower pelvic pain. Her orthopedic surgeon says that she has osteoarthritis. She takes tramadol with partial relief. She has had a TAH left S&O but still has her right ovary. Some of her pain is in that region. She's noticed no nausea, vomiting, diarrhea or change  in bowel habits. She has no dysuria, frequency, or urgency of urination. She is having no  vaginal bleeding. She has been treated for breast cancer with lumpectomy and radiation. She currently takes Femara. Her last bone density was normal in 2010. She's had no fractures. On her last mammogram both all fibroadenomas and a new one in her left breast and she is scheduled for short-term followup. She has vaginal dryness but is fine and she is not sexually active.  ROS: 12 system review done. Pertinent positives above. Other positives include hyperlipidemia, diabetes, hypertension, anemia, rhinitis-allergic, osteoarthritis and cataracts.  HEENT: Within normal limits.  Kennon Portela present. Neck: No masses. Supraclavicular lymph nodes: Not enlarged. Breasts: Examined in both sitting and lying position. Symmetrical without skin changes or masses. Abdomen: Soft no masses guarding or rebound. No hernias. Pelvic: External within normal limits. BUS within normal limits. Vaginal examination shows poor estrogen effect, no cystocele enterocele or rectocele. Cervix and uterus absent. Adnexa within normal limits. Rectovaginal confirmatory. Extremities within normal limits.  Assessment: #1. Breast cancer #2. Pelvic and back pain #3. Osteoarthritis #4. Atrophic vaginitis #5. Fibroadenomas of breasts.  Plan: Pelvic ultrasound scheduled due to pain. Short-term mammogram followup with bone density the same day.

## 2011-09-22 NOTE — Patient Instructions (Signed)
Schedule pelvic ultrasound here. When you get your next mammogram also have a bone density.

## 2011-09-22 NOTE — Progress Notes (Signed)
Addended by: Dayna Barker on: 09/22/2011 11:55 AM   Modules accepted: Orders

## 2011-09-24 ENCOUNTER — Other Ambulatory Visit: Payer: Self-pay | Admitting: Internal Medicine

## 2011-09-25 ENCOUNTER — Other Ambulatory Visit: Payer: Self-pay | Admitting: Internal Medicine

## 2011-09-30 ENCOUNTER — Other Ambulatory Visit: Payer: Self-pay | Admitting: *Deleted

## 2011-09-30 ENCOUNTER — Other Ambulatory Visit (HOSPITAL_BASED_OUTPATIENT_CLINIC_OR_DEPARTMENT_OTHER): Payer: Medicare Other

## 2011-09-30 DIAGNOSIS — E119 Type 2 diabetes mellitus without complications: Secondary | ICD-10-CM

## 2011-09-30 DIAGNOSIS — C801 Malignant (primary) neoplasm, unspecified: Secondary | ICD-10-CM

## 2011-09-30 DIAGNOSIS — C50419 Malignant neoplasm of upper-outer quadrant of unspecified female breast: Secondary | ICD-10-CM

## 2011-09-30 DIAGNOSIS — C50919 Malignant neoplasm of unspecified site of unspecified female breast: Secondary | ICD-10-CM

## 2011-09-30 DIAGNOSIS — D649 Anemia, unspecified: Secondary | ICD-10-CM

## 2011-09-30 LAB — CBC WITH DIFFERENTIAL/PLATELET
BASO%: 0.6 % (ref 0.0–2.0)
Eosinophils Absolute: 0.1 10*3/uL (ref 0.0–0.5)
LYMPH%: 23.8 % (ref 14.0–49.7)
MCHC: 33.4 g/dL (ref 31.5–36.0)
MCV: 79.7 fL (ref 79.5–101.0)
MONO%: 13.4 % (ref 0.0–14.0)
NEUT#: 4.1 10*3/uL (ref 1.5–6.5)
Platelets: 143 10*3/uL — ABNORMAL LOW (ref 145–400)
RBC: 4.16 10*6/uL (ref 3.70–5.45)
RDW: 17.1 % — ABNORMAL HIGH (ref 11.2–14.5)
WBC: 6.8 10*3/uL (ref 3.9–10.3)
nRBC: 0 % (ref 0–0)

## 2011-09-30 LAB — COMPREHENSIVE METABOLIC PANEL
ALT: 21 U/L (ref 0–35)
AST: 22 U/L (ref 0–37)
Alkaline Phosphatase: 98 U/L (ref 39–117)
Creatinine, Ser: 0.96 mg/dL (ref 0.50–1.10)
Sodium: 138 mEq/L (ref 135–145)
Total Bilirubin: 0.7 mg/dL (ref 0.3–1.2)
Total Protein: 7.1 g/dL (ref 6.0–8.3)

## 2011-09-30 LAB — CANCER ANTIGEN 27.29: CA 27.29: 27 U/mL (ref 0–39)

## 2011-10-01 ENCOUNTER — Ambulatory Visit (INDEPENDENT_AMBULATORY_CARE_PROVIDER_SITE_OTHER): Payer: Medicare Other

## 2011-10-01 ENCOUNTER — Ambulatory Visit (INDEPENDENT_AMBULATORY_CARE_PROVIDER_SITE_OTHER): Payer: Medicare Other | Admitting: Obstetrics and Gynecology

## 2011-10-01 DIAGNOSIS — N949 Unspecified condition associated with female genital organs and menstrual cycle: Secondary | ICD-10-CM

## 2011-10-01 DIAGNOSIS — R102 Pelvic and perineal pain: Secondary | ICD-10-CM

## 2011-10-01 DIAGNOSIS — C50919 Malignant neoplasm of unspecified site of unspecified female breast: Secondary | ICD-10-CM

## 2011-10-01 DIAGNOSIS — N83339 Acquired atrophy of ovary and fallopian tube, unspecified side: Secondary | ICD-10-CM

## 2011-10-01 NOTE — Progress Notes (Signed)
Patient came to see me today for an ultrasound due to right lower quadrant pain. She also has the pain in her lower back and has been told she has osteoarthritis of her pelvic bones. On ultrasound today her uterus is surgically absent. Her right ovary is atrophic. There is no mass in the left adnexa at the site of her previous surgery. Her cul-de-sac is free of fluid.  Assessment: Pelvic pain probably secondary to osteoarthritis.  Plan: The patient reassured there was no GYN pathology. She will followup with her orthopedic surgeon.

## 2011-10-07 ENCOUNTER — Other Ambulatory Visit: Payer: Medicare Other | Admitting: Lab

## 2011-10-07 ENCOUNTER — Ambulatory Visit (HOSPITAL_BASED_OUTPATIENT_CLINIC_OR_DEPARTMENT_OTHER): Payer: Medicare Other | Admitting: Oncology

## 2011-10-07 VITALS — BP 138/83 | HR 87 | Temp 97.7°F | Ht 62.0 in | Wt 243.9 lb

## 2011-10-07 DIAGNOSIS — Z853 Personal history of malignant neoplasm of breast: Secondary | ICD-10-CM

## 2011-10-07 DIAGNOSIS — C50419 Malignant neoplasm of upper-outer quadrant of unspecified female breast: Secondary | ICD-10-CM

## 2011-10-07 DIAGNOSIS — E559 Vitamin D deficiency, unspecified: Secondary | ICD-10-CM

## 2011-10-07 DIAGNOSIS — Z170 Estrogen receptor positive status [ER+]: Secondary | ICD-10-CM

## 2011-10-07 NOTE — Progress Notes (Signed)
Hematology and Oncology Follow Up Visit  Doris Jackson [id number] 30-Apr-1941 71 y.o. 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  Interim History:  There have been no intercurrent illness, hospitalizations or medication changes. She has short term f/u of lt breast with imaging due in 5/13  Medications: I have reviewed the patient's current medications.  Allergies: No Known Allergies  Past Medical History, Surgical history, Social history, and Family History were reviewed and updated.  Review of Systems: Constitutional:  Negative for fever, chills, night sweats, anorexia, weight loss, pain. Cardiovascular: no chest pain or dyspnea on exertion Respiratory: no cough, shortness of breath, or wheezing Neurological: no TIA or stroke symptoms Dermatological: negative ENT: negative Skin Gastrointestinal: negative Genito-Urinary: negative Hematological and Lymphatic: negative Breast: negative Musculoskeletal: low back pain which is chronic Remaining ROS negative.  Physical Exam: Blood pressure 138/83, pulse 87, temperature 97.7 F (36.5 C), height 5\' 2"  (1.575 m), weight 243 lb 14.4 oz (110.632 kg). ECOG: 0 General appearance: alert, cooperative and appears stated age Head: Normocephalic, without obvious abnormality, atraumatic Neck: no adenopathy, no carotid bruit, no JVD, supple, symmetrical, trachea midline and thyroid not enlarged, symmetric, no tenderness/mass/nodules Lymph nodes: Cervical, supraclavicular, and axillary nodes normal. Cardiac : regular rate and rhythm Pulmonary:clear to auscultation bilaterally and normal percussion bilaterally Breasts: inspection negative, no nipple discharge or bleeding, no masses or nodularity palpable Abdomen:soft, non-tender; bowel sounds normal; no masses,  no organomegaly Extremities negative Neuro: alert, oriented,  normal speech, no focal findings or movement disorder noted  Lab Results: Lab Results  Component Value Date   WBC 6.8 09/30/2011   HGB 11.1* 09/30/2011   HCT 33.2* 09/30/2011   MCV 79.7 09/30/2011   PLT 143* 09/30/2011     Chemistry      Component Value Date/Time   NA 138 09/30/2011 1130   K 4.3 09/30/2011 1130   CL 104 09/30/2011 1130   CO2 25 09/30/2011 1130   BUN 21 09/30/2011 1130   CREATININE 0.96 09/30/2011 1130      Component Value Date/Time   CALCIUM 9.2 09/30/2011 1130   ALKPHOS 98 09/30/2011 1130   AST 22 09/30/2011 1130   ALT 21 09/30/2011 1130   BILITOT 0.7 09/30/2011 1130      .pathology. Radiological Studies: chest X-ray n/a Mammogram Due 5/13 Bone density Due 5/13  Impression and Plan: Doris Jackson is doing well without obvious evidence of recurrence, i wil see her in 1 yr..she will continue letrozole. She has a short term f/u for her mammogram and I have added a bone density as well.  More than 50% of the visit was spent in patient-related counselling   Pierce Crane, MD 4/9/201312:01 PM

## 2011-10-27 ENCOUNTER — Encounter: Payer: Self-pay | Admitting: Internal Medicine

## 2011-10-27 ENCOUNTER — Ambulatory Visit (INDEPENDENT_AMBULATORY_CARE_PROVIDER_SITE_OTHER): Payer: Medicare Other | Admitting: Internal Medicine

## 2011-10-27 VITALS — BP 146/94 | Temp 97.7°F | Wt 248.0 lb

## 2011-10-27 DIAGNOSIS — E785 Hyperlipidemia, unspecified: Secondary | ICD-10-CM

## 2011-10-27 DIAGNOSIS — Z111 Encounter for screening for respiratory tuberculosis: Secondary | ICD-10-CM

## 2011-10-27 DIAGNOSIS — Z853 Personal history of malignant neoplasm of breast: Secondary | ICD-10-CM

## 2011-10-27 DIAGNOSIS — I10 Essential (primary) hypertension: Secondary | ICD-10-CM

## 2011-10-27 DIAGNOSIS — E119 Type 2 diabetes mellitus without complications: Secondary | ICD-10-CM

## 2011-10-27 DIAGNOSIS — IMO0001 Reserved for inherently not codable concepts without codable children: Secondary | ICD-10-CM

## 2011-10-27 DIAGNOSIS — Z0000 Encounter for general adult medical examination without abnormal findings: Secondary | ICD-10-CM

## 2011-10-27 LAB — HEMOGLOBIN A1C: Hgb A1c MFr Bld: 7.4 % — ABNORMAL HIGH (ref 4.6–6.5)

## 2011-10-27 LAB — LIPID PANEL: VLDL: 23.8 mg/dL (ref 0.0–40.0)

## 2011-10-27 MED ORDER — SITAGLIP PHOS-METFORMIN HCL ER 100-1000 MG PO TB24: 1.0000 | ORAL_TABLET | Freq: Every morning | ORAL | Status: DC

## 2011-10-27 MED ORDER — HYDROCHLOROTHIAZIDE 25 MG PO TABS: 25.0000 mg | ORAL_TABLET | Freq: Every day | ORAL | Status: DC

## 2011-10-27 NOTE — Progress Notes (Signed)
  Subjective:    Patient ID: Doris Jackson, female    DOB: Oct 02, 1940, 71 y.o.   MRN: [id number]  HPI 71 year old patient who is seen today for followup of her type 2 diabetes. She has exogenous obesity. Her last hemoglobin A1c 7.3. She feels her glycemic control is about the same. She has treated hypertension. She also has a history of right breast cancer and is followed by oncology. She has dyslipidemia. She remains on statin therapy. No recent lipid profile  Wt Readings from Last 3 Encounters:  10/27/11 248 lb (112.492 kg)  10/07/11 243 lb 14.4 oz (110.632 kg)  09/22/11 244 lb (110.678 kg)      Review of Systems  Constitutional: Negative.   HENT: Negative for hearing loss, congestion, sore throat, rhinorrhea, dental problem, sinus pressure and tinnitus.   Eyes: Negative for pain, discharge and visual disturbance.  Respiratory: Negative for cough and shortness of breath.   Cardiovascular: Negative for chest pain, palpitations and leg swelling.  Gastrointestinal: Negative for nausea, vomiting, abdominal pain, diarrhea, constipation, blood in stool and abdominal distention.  Genitourinary: Negative for dysuria, urgency, frequency, hematuria, flank pain, vaginal bleeding, vaginal discharge, difficulty urinating, vaginal pain and pelvic pain.  Musculoskeletal: Negative for joint swelling, arthralgias and gait problem.  Skin: Negative for rash.  Neurological: Negative for dizziness, syncope, speech difficulty, weakness, numbness and headaches.  Hematological: Negative for adenopathy.  Psychiatric/Behavioral: Negative for behavioral problems, dysphoric mood and agitation. The patient is not nervous/anxious.        Objective:   Physical Exam  Constitutional: She is oriented to person, place, and time. She appears well-developed and well-nourished.       Obese Repeat blood pressure 124/82  HENT:  Head: Normocephalic.  Right Ear: External ear normal.  Left Ear: External ear normal.    Mouth/Throat: Oropharynx is clear and moist.  Eyes: Conjunctivae and EOM are normal. Pupils are equal, round, and reactive to light.  Neck: Normal range of motion. Neck supple. No thyromegaly present.  Cardiovascular: Normal rate, regular rhythm, normal heart sounds and intact distal pulses.   Pulmonary/Chest: Effort normal and breath sounds normal.  Abdominal: Soft. Bowel sounds are normal. She exhibits no mass. There is no tenderness.  Musculoskeletal: Normal range of motion.  Lymphadenopathy:    She has no cervical adenopathy.  Neurological: She is alert and oriented to person, place, and time.  Skin: Skin is warm and dry. No rash noted.  Psychiatric: She has a normal mood and affect. Her behavior is normal.          Assessment & Plan:      Diabetes mellitus we'll check a hemoglobin A1c. Diet weight loss exercise all encouraged Hypertension well controlled Exogenous obesity Dyslipidemia. We'll check a lipid profile  Recheck 3 months

## 2011-10-27 NOTE — Patient Instructions (Signed)
Please check your hemoglobin A1c every 3 months  You need to lose weight.  Consider a lower calorie diet and regular exercise.    It is important that you exercise regularly, at least 20 minutes 3 to 4 times per week.  If you develop chest pain or shortness of breath seek  medical attention.  Return in 3 months for follow-up   

## 2011-10-29 LAB — TB SKIN TEST
Induration: 0
TB Skin Test: NEGATIVE mm

## 2011-11-10 LAB — HM MAMMOGRAPHY

## 2011-11-12 ENCOUNTER — Other Ambulatory Visit: Payer: Self-pay | Admitting: *Deleted

## 2011-11-12 DIAGNOSIS — Z853 Personal history of malignant neoplasm of breast: Secondary | ICD-10-CM

## 2011-11-13 ENCOUNTER — Encounter: Payer: Self-pay | Admitting: Internal Medicine

## 2011-11-13 ENCOUNTER — Other Ambulatory Visit: Payer: Self-pay | Admitting: Internal Medicine

## 2011-11-13 ENCOUNTER — Other Ambulatory Visit: Payer: Self-pay | Admitting: Obstetrics and Gynecology

## 2011-11-13 DIAGNOSIS — Z853 Personal history of malignant neoplasm of breast: Secondary | ICD-10-CM

## 2011-11-14 ENCOUNTER — Other Ambulatory Visit: Payer: Self-pay | Admitting: Internal Medicine

## 2011-11-17 ENCOUNTER — Encounter: Payer: Self-pay | Admitting: Obstetrics and Gynecology

## 2011-11-24 ENCOUNTER — Other Ambulatory Visit: Payer: Self-pay | Admitting: Internal Medicine

## 2011-12-14 ENCOUNTER — Other Ambulatory Visit: Payer: Self-pay | Admitting: Internal Medicine

## 2011-12-15 ENCOUNTER — Other Ambulatory Visit: Payer: Self-pay | Admitting: Internal Medicine

## 2012-01-15 ENCOUNTER — Telehealth: Payer: Self-pay | Admitting: Internal Medicine

## 2012-01-15 NOTE — Telephone Encounter (Signed)
Caller: Burnett/Patient; PCP: Eleonore Chiquito; CB#: [phone]. Call regarding Needs Samples of Meds. Mrs Bitterman calling for samples of "[employer]" and [place].  Caller advised a note will be sent with this request. She can be reached at above #.

## 2012-01-15 NOTE — Telephone Encounter (Signed)
OK to drop by for samples

## 2012-01-16 NOTE — Telephone Encounter (Signed)
Samples up front ready for p/u, pt aware 

## 2012-01-16 NOTE — Telephone Encounter (Signed)
Caller: Latresa/Patient; PCP: Eleonore Chiquito; CB#: [phone]; ; ; Call regarding Follow Up Regarding Samples; per note in Epic, patient had requested samples of Januvia and Welchol package; Dr. Amador Cunas states in note that the samples are ready for patient to pick up.  Patient advised; will come by the office to pick up samples.   No  further questions or concerns at this time.

## 2012-01-16 NOTE — Telephone Encounter (Signed)
Kim, if you look at the chat below, CAN says Dr. Kirtland Bouchard states samples are ready, but that's not what I read. Per CAN though, it sounds like they sent her over here to pick them up, so can we get them ready? Thanks.

## 2012-01-20 ENCOUNTER — Encounter: Payer: Self-pay | Admitting: Internal Medicine

## 2012-01-20 ENCOUNTER — Ambulatory Visit (INDEPENDENT_AMBULATORY_CARE_PROVIDER_SITE_OTHER): Payer: Medicare Other | Admitting: Internal Medicine

## 2012-01-20 VITALS — BP 124/82 | Temp 97.9°F | Wt 248.0 lb

## 2012-01-20 DIAGNOSIS — I10 Essential (primary) hypertension: Secondary | ICD-10-CM

## 2012-01-20 DIAGNOSIS — E119 Type 2 diabetes mellitus without complications: Secondary | ICD-10-CM

## 2012-01-20 DIAGNOSIS — E785 Hyperlipidemia, unspecified: Secondary | ICD-10-CM

## 2012-01-20 LAB — HEMOGLOBIN A1C: Hgb A1c MFr Bld: 7.5 % — ABNORMAL HIGH (ref 4.6–6.5)

## 2012-01-20 NOTE — Progress Notes (Signed)
Subjective:    Patient ID: Doris Jackson, female    DOB: 11/11/1940, 71 y.o.   MRN: [id number]  HPI  71 year old patient who has type 2 diabetes hypertension and dyslipidemia who is seen today for her poorly followup. She states fasting blood sugars are generally in the 150-170 range but often are in excess of 200 in the afternoon. She is on fairly maximal oral medications including glipizide Glucophage WelChol and Januvia  Past Medical History  Diagnosis Date  . ALLERGIC RHINITIS 06/17/2010  . ANEMIA-NOS 06/17/2010  . BREAST CANCER, HX OF 06/17/2010  . COPD 06/17/2010  . HYPERLIPIDEMIA 06/17/2010  . LOW BACK PAIN 06/17/2010  . OSTEOARTHRITIS 06/17/2010  . OSTEOPENIA 06/17/2010  . Leg swelling   . Weakness   . DIABETES MELLITUS, TYPE II 06/17/2010  . HYPERTENSION 06/17/2010  . Cancer 2008    right breast  . Fibroid     FIBROID  . Endometriosis   . Cataract     History   Social History  . Marital Status: Married    Spouse Name: N/A    Number of Children: N/A  . Years of Education: N/A   Occupational History  . Not on file.   Social History Main Topics  . Smoking status: Never Smoker   . Smokeless tobacco: Never Used  . Alcohol Use: No  . Drug Use: No  . Sexually Active: No   Other Topics Concern  . Not on file   Social History Narrative  . No narrative on file    Past Surgical History  Procedure Date  . Abdominal hysterectomy 1988    TAH,LSO  . Oophorectomy 1988    TAH,LSO  . Tubal ligation   . Breast lumpectomy 2007    LUMPECTOMY FOLLOWED BY RADIATION    Family History  Problem Relation Age of Onset  . Diabetes Mother   . Hypertension Sister     No Known Allergies  Current Outpatient Prescriptions on File Prior to Visit  Medication Sig Dispense Refill  . Cholecalciferol (VITAMIN D) 2000 UNITS CAPS Take 1 capsule by mouth daily. Taking 2 caps daily 4,000      . CLEVER CHEK TEST test strip USE TWICE DAILY AS DIRECTED FOR GLUCOSE TESTING AND  MONITORING  200 each  0  . cyanocobalamin 2000 MCG tablet Take 2,000 mcg by mouth daily.        Marland Kitchen FERREX 150 150 MG capsule TAKE ONE CAPSULE BY MOUTH EVERY DAY  30 capsule  2  . glipiZIDE (GLUCOTROL XL) 10 MG 24 hr tablet TAKE 1 TABLET BY MOUTH TWICE A DAY  60 tablet  3  . hydrochlorothiazide (HYDRODIURIL) 25 MG tablet Take 1 tablet (25 mg total) by mouth daily.  90 tablet  4  . Lancets 30G MISC USE TWICE DAILY AS DIRECTED FOR GLUCOSE TESTING AND MONITORING  200 each  4  . letrozole (FEMARA) 2.5 MG tablet Take 2.5 mg by mouth daily.        . Magnesium 250 MG TABS Take 1 tablet by mouth as needed.       . metFORMIN (GLUCOPHAGE) 1000 MG tablet TAKE 1 TABLET BY MOUTH TWICE A DAY WITH FOOD  180 tablet  2  . Multiple Vitamin (MULTIVITAMIN) tablet Take 1 tablet by mouth daily.        . TOPROL XL 50 MG 24 hr tablet TAKE 1 TABLET BY MOUTH IN THE MORNING  90 tablet  3  . WELCHOL 3.75 G PACK TAKE 1  PACKET(3.75 G) BY MOUTH EVERY DAY  30 each  5  . metFORMIN (GLUCOPHAGE XR) 500 MG 24 hr tablet Two tablets every morning  180 tablet  6  . Olmesartan-Amlodipine-HCTZ (TRIBENZOR) 40-5-25 MG TABS Take 1 tablet by mouth daily.  30 tablet  6  . pravastatin (PRAVACHOL) 40 MG tablet Take 1 tablet (40 mg total) by mouth daily.  90 tablet  6  . SitaGLIPtin-MetFORMIN HCl [phone] MG TB24 Take 1 tablet by mouth every morning.  30 tablet  6  . DISCONTD: Colesevelam HCl 3.75 G PACK Take 3.75 g by mouth 2 (two) times daily.          BP 124/82  Temp 97.9 F (36.6 C) (Oral)  Wt 248 lb (112.492 kg)       Review of Systems  Constitutional: Negative.   HENT: Negative for hearing loss, congestion, sore throat, rhinorrhea, dental problem, sinus pressure and tinnitus.   Eyes: Negative for pain, discharge and visual disturbance.  Respiratory: Negative for cough and shortness of breath.   Cardiovascular: Negative for chest pain, palpitations and leg swelling.  Gastrointestinal: Negative for nausea, vomiting, abdominal  pain, diarrhea, constipation, blood in stool and abdominal distention.  Genitourinary: Negative for dysuria, urgency, frequency, hematuria, flank pain, vaginal bleeding, vaginal discharge, difficulty urinating, vaginal pain and pelvic pain.  Musculoskeletal: Negative for joint swelling, arthralgias and gait problem.  Skin: Negative for rash.  Neurological: Negative for dizziness, syncope, speech difficulty, weakness, numbness and headaches.  Hematological: Negative for adenopathy.  Psychiatric/Behavioral: Negative for behavioral problems, dysphoric mood and agitation. The patient is not nervous/anxious.        Objective:   Physical Exam  Constitutional: She is oriented to person, place, and time. She appears well-developed and well-nourished.  HENT:  Head: Normocephalic.  Right Ear: External ear normal.  Left Ear: External ear normal.  Mouth/Throat: Oropharynx is clear and moist.  Eyes: Conjunctivae and EOM are normal. Pupils are equal, round, and reactive to light.  Neck: Normal range of motion. Neck supple. No thyromegaly present.  Cardiovascular: Normal rate, regular rhythm, normal heart sounds and intact distal pulses.   Pulmonary/Chest: Effort normal and breath sounds normal.  Abdominal: Soft. Bowel sounds are normal. She exhibits no mass. There is no tenderness.  Musculoskeletal: Normal range of motion.  Lymphadenopathy:    She has no cervical adenopathy.  Neurological: She is alert and oriented to person, place, and time.  Skin: Skin is warm and dry. No rash noted.  Psychiatric: She has a normal mood and affect. Her behavior is normal.          Assessment & Plan:    Diabetes mellitus. We'll check a hemoglobin A1c. Once all issues discussed and weight loss exercise and encouraged Dyslipidemia. We'll continue pravastatin compliance was stressed Hypertension well controlled Exogenous obesity. Weight loss encouraged  Recheck 3 months  Copious samples dispensed

## 2012-01-21 ENCOUNTER — Telehealth: Payer: Self-pay | Admitting: Internal Medicine

## 2012-01-21 NOTE — Telephone Encounter (Signed)
We have faxed denial - pt request to not use this company and we support that.

## 2012-01-21 NOTE — Telephone Encounter (Signed)
Caller: Bryan/Patient is calling with a question about Diabetic Supples.The medication was written by Eleonore Chiquito. Patient reports that she uses [facility] for her diabetic supplies. She has recently received a box of supplies from [facility]. She called this company and they are requiring her physician to send information to them that she does not want her diabetic medical supplies from this company. Please call or fax this information to [facility]. Phone # [phone]. Fax # [phone]. Thanks.

## 2012-02-18 ENCOUNTER — Other Ambulatory Visit: Payer: Self-pay | Admitting: Oncology

## 2012-02-18 DIAGNOSIS — Z853 Personal history of malignant neoplasm of breast: Secondary | ICD-10-CM

## 2012-02-24 ENCOUNTER — Other Ambulatory Visit: Payer: Self-pay | Admitting: Internal Medicine

## 2012-02-24 MED ORDER — AMLODIPINE-OLMESARTAN 5-40 MG PO TABS: 1.0000 | ORAL_TABLET | Freq: Every day | ORAL | Status: DC

## 2012-02-24 NOTE — Telephone Encounter (Signed)
done

## 2012-02-24 NOTE — Telephone Encounter (Signed)
Please advise on mg? Lab?

## 2012-02-24 NOTE — Telephone Encounter (Signed)
Ok azor 5/40  90 one daily

## 2012-02-24 NOTE — Telephone Encounter (Signed)
Caller: [name]/Patient; Patient Name: Doris Jackson; PCP: Eleonore Chiquito; Best Callback Phone Number: [phone]; Call regarding: Medication Refill for Azor; has used samples for the past three months and now needs a rx called in to [facility] [address] since samples will run out at the end of the week; denies any symptoms; also requested results of Hgb A1C drawn 7/13 at last appt

## 2012-02-27 ENCOUNTER — Telehealth: Payer: Self-pay | Admitting: Internal Medicine

## 2012-02-27 MED ORDER — AMLODIPINE BESY-BENAZEPRIL HCL 5-40 MG PO CAPS: 1.0000 | ORAL_CAPSULE | Freq: Every day | ORAL | Status: DC

## 2012-02-27 NOTE — Telephone Encounter (Signed)
Spoke with pt- aware of new rx to go to [facility]

## 2012-02-27 NOTE — Telephone Encounter (Signed)
Caller: Ellanie/Patient; Patient Name: Doris Jackson; PCP: Eleonore Chiquito; [facility] Call[facility] Phone Number: [phone].  Call regarding Azor samples needed due to insurance cost of medication, $200.00, and no generic form available. Per Patient, she has been on Azor for 2 mths, taking samples from office.  [facility] uses [facility], [place], [phone]. Patient denies symptoms at time of call.

## 2012-02-27 NOTE — Telephone Encounter (Signed)
Generic lotrel 5/40  #90 one daily

## 2012-02-27 NOTE — Telephone Encounter (Signed)
Please advise on different rx

## 2012-03-13 ENCOUNTER — Other Ambulatory Visit: Payer: Self-pay | Admitting: Internal Medicine

## 2012-03-17 ENCOUNTER — Telehealth: Payer: Self-pay | Admitting: Internal Medicine

## 2012-03-17 NOTE — Telephone Encounter (Signed)
Caller: Saray/Patient; Phone: [phone]; Reason for Call: Patient request to speak with Selena Batten regarding medications.  She needs to get a written prescription for Januvia 100mg  and Furosemide 20mg .  Patient also wants to know if the office has samples of the Januvia.  Please call her back.  Thanks

## 2012-03-18 ENCOUNTER — Telehealth: Payer: Self-pay | Admitting: Internal Medicine

## 2012-03-18 MED ORDER — FUROSEMIDE 20 MG PO TABS: 20.0000 mg | ORAL_TABLET | Freq: Every day | ORAL | Status: DC | PRN

## 2012-03-18 NOTE — Telephone Encounter (Signed)
Pt is calling back (see note from yesterday). She is waiting for Lasix to be called into pharmacy and for nurse to call and let her know if there are Januvia samples since she can't afford what the pharmacy will be charging her.

## 2012-03-18 NOTE — Telephone Encounter (Signed)
See pther phone note - this is being handled.

## 2012-03-18 NOTE — Telephone Encounter (Signed)
done

## 2012-03-18 NOTE — Telephone Encounter (Signed)
Spoke with pt - have januvia samples - but will have to ask Dr. Amador Cunas about lasix - I dont see on past or present med list - states it was a prn med from her "other doctor"  From awhile ago - used prn for leg swelling  Please advise

## 2012-03-18 NOTE — Telephone Encounter (Signed)
Okay furosemide 20 mg #30 one daily when necessary swelling

## 2012-03-24 ENCOUNTER — Telehealth: Payer: Self-pay | Admitting: Internal Medicine

## 2012-03-24 ENCOUNTER — Other Ambulatory Visit: Payer: Self-pay | Admitting: Internal Medicine

## 2012-03-24 NOTE — Telephone Encounter (Signed)
Attempt to call- VM - LMTCB if problems - need to know what the issues are so if some one can help - I am out of office until tomorrow

## 2012-03-24 NOTE — Telephone Encounter (Signed)
Caller: Jelisha/Patient; Phone: [phone]; Reason for Call: Patient request to speak with Selena Batten regarding her blood pressure medications.  Please call her back.  Thanks

## 2012-03-25 ENCOUNTER — Telehealth: Payer: Self-pay | Admitting: Internal Medicine

## 2012-03-25 NOTE — Telephone Encounter (Signed)
ATTEMPT TO CALL- vm ON CELL # - [email or web address] - IF I AM NOT BUSY , I WILL TAKE CALL

## 2012-03-25 NOTE — Telephone Encounter (Signed)
We have no azor samples - pt never picked up lotrel we called out 2 mos ago (due to cost of other med) - she will check and pick up med. Cost should be cheaper. I instructed her to bring ALL meds and samples she has to next appt - we need to make sure she is on the right combo of meds - she is unable to give an accurate history of what she really is taking on a daily basis.

## 2012-03-25 NOTE — Telephone Encounter (Signed)
Caller: Tamey/Patient; Phone: [phone]; Reason for Call: Patient returning Kim's call yesterday.  Please call her back, if before 11:30am call her home. If after 11:30am please call cell [phone].  Thanks

## 2012-03-25 NOTE — Telephone Encounter (Signed)
Attempt to call again - VM - LMTCB

## 2012-03-25 NOTE — Telephone Encounter (Signed)
Caller: Reality/Patient; Patient Name: Doris Jackson; PCP: Eleonore Chiquito [facility]); Best Callback Phone Number: [phone].  Returning call to office nurse Kim from 03/25/12.  Reports nearly out of BP medication, does not know the name, thinks it was a medication that has a Z in the name.   Kim gave samples of medications 2-3 weeks ago.  Epic documents several calls over past few days related to other medications.  Names of samples given were not  found in documentation. The prescription was too high due to fill due to Medicare donut hole.  Last office visit 01/20/12. Scheduled for epidural pain block 03/26/12 so will not be home from 0900-1300.  Please call at above number or cell [phone]. Information noted and sent to [facility] Pool for follow up for request refill of prescribed medication with valid refills per Medication Questions guideline.

## 2012-03-31 ENCOUNTER — Encounter: Payer: Self-pay | Admitting: *Deleted

## 2012-03-31 DIAGNOSIS — Z853 Personal history of malignant neoplasm of breast: Secondary | ICD-10-CM

## 2012-04-20 ENCOUNTER — Encounter: Payer: Medicare Other | Admitting: *Deleted

## 2012-04-20 ENCOUNTER — Other Ambulatory Visit (HOSPITAL_BASED_OUTPATIENT_CLINIC_OR_DEPARTMENT_OTHER): Payer: Medicare Other | Admitting: Lab

## 2012-04-20 DIAGNOSIS — C50419 Malignant neoplasm of upper-outer quadrant of unspecified female breast: Secondary | ICD-10-CM

## 2012-04-20 DIAGNOSIS — E559 Vitamin D deficiency, unspecified: Secondary | ICD-10-CM

## 2012-04-20 DIAGNOSIS — Z853 Personal history of malignant neoplasm of breast: Secondary | ICD-10-CM

## 2012-04-20 LAB — COMPREHENSIVE METABOLIC PANEL (CC13)
Albumin: 3.5 g/dL (ref 3.5–5.0)
Alkaline Phosphatase: 96 U/L (ref 40–150)
BUN: 20 mg/dL (ref 7.0–26.0)
CO2: 22 mEq/L (ref 22–29)
Calcium: 9.2 mg/dL (ref 8.4–10.4)
Chloride: 107 mEq/L (ref 98–107)
Glucose: 148 mg/dl — ABNORMAL HIGH (ref 70–99)
Potassium: 3.5 mEq/L (ref 3.5–5.1)
Sodium: 140 mEq/L (ref 136–145)
Total Protein: 6.8 g/dL (ref 6.4–8.3)

## 2012-04-20 LAB — CBC WITH DIFFERENTIAL/PLATELET
BASO%: 0.5 % (ref 0.0–2.0)
Basophils Absolute: 0 10*3/uL (ref 0.0–0.1)
HCT: 33.7 % — ABNORMAL LOW (ref 34.8–46.6)
HGB: 11.5 g/dL — ABNORMAL LOW (ref 11.6–15.9)
LYMPH%: 23.2 % (ref 14.0–49.7)
MCHC: 34 g/dL (ref 31.5–36.0)
MONO#: 0.9 10*3/uL (ref 0.1–0.9)
NEUT%: 62.4 % (ref 38.4–76.8)
Platelets: 124 10*3/uL — ABNORMAL LOW (ref 145–400)
WBC: 7.8 10*3/uL (ref 3.9–10.3)
lymph#: 1.8 10*3/uL (ref 0.9–3.3)

## 2012-04-22 ENCOUNTER — Telehealth: Payer: Self-pay | Admitting: Internal Medicine

## 2012-04-22 NOTE — Telephone Encounter (Signed)
Patient called stating that she would like to have samples of Januvia. Please assist.

## 2012-04-22 NOTE — Telephone Encounter (Signed)
Noted - will get samples if there are any here tomorrow

## 2012-04-22 NOTE — Telephone Encounter (Signed)
Pt called back and said that she also needs samples of Welchol. Prefers the powder form, but pills are ok Also wants Januvia samples. Pt coming in for flu shot tomorrow and would like to pick up samples then.

## 2012-04-23 ENCOUNTER — Ambulatory Visit: Payer: Medicare Other

## 2012-04-27 ENCOUNTER — Telehealth: Payer: Self-pay | Admitting: *Deleted

## 2012-04-27 ENCOUNTER — Ambulatory Visit (HOSPITAL_BASED_OUTPATIENT_CLINIC_OR_DEPARTMENT_OTHER): Payer: Medicare Other | Admitting: Oncology

## 2012-04-27 VITALS — BP 138/84 | HR 90 | Temp 97.7°F | Resp 20 | Ht 62.0 in | Wt 248.7 lb

## 2012-04-27 DIAGNOSIS — Z170 Estrogen receptor positive status [ER+]: Secondary | ICD-10-CM

## 2012-04-27 DIAGNOSIS — C50419 Malignant neoplasm of upper-outer quadrant of unspecified female breast: Secondary | ICD-10-CM

## 2012-04-27 DIAGNOSIS — Z853 Personal history of malignant neoplasm of breast: Secondary | ICD-10-CM

## 2012-04-27 DIAGNOSIS — D649 Anemia, unspecified: Secondary | ICD-10-CM

## 2012-04-27 NOTE — Progress Notes (Signed)
Hematology and Oncology Follow Up Visit  Doris Jackson [id number] 03-10-1941 71 y.o. 04/27/2012 11:42 AM PCP Doris Jackson/ Doris Jackson  Principle Diagnosis: T1CN0 er/pr+ breast cancer s/p lumpectomy and xrt completed 08/20/06, on letrozole. History of hemoglobinopathy  Interim History:  There have been no intercurrent illness, hospitalizations or medication changes. She has short term f/u of lt breast with imaging , this suggests a fibroadenoma another mammogram is due in November. A bone density test done in May shows slight decrease in bone density but essentially normal values  Medications: I have reviewed the patient's current medications.  Allergies: No Known Allergies  Past Medical History, Surgical history, Social history, and Family History were reviewed and updated.  Review of Systems: Constitutional:  Negative for fever, chills, night sweats, anorexia, weight loss, pain. Cardiovascular: no chest pain or dyspnea on exertion Respiratory: no cough, shortness of breath, or wheezing Neurological: no TIA or stroke symptoms Dermatological: negative ENT: negative Skin Gastrointestinal: negative Genito-Urinary: negative Hematological and Lymphatic: negative Breast: negative Musculoskeletal: low back pain which is chronic Remaining ROS negative.  Physical Exam: There were no vitals taken for this visit. ECOG: 0 General appearance: alert, cooperative and appears stated age Head: Normocephalic, without obvious abnormality, atraumatic Neck: no adenopathy, no carotid bruit, no JVD, supple, symmetrical, trachea midline and thyroid not enlarged, symmetric, no tenderness/mass/nodules Lymph nodes: Cervical, supraclavicular, and axillary nodes normal. Cardiac : regular rate and rhythm Pulmonary:clear to auscultation bilaterally and normal percussion bilaterally Breasts: inspection negative, no nipple discharge or bleeding, no masses or nodularity palpable, there is no  obvious masses breasts are large pendulous. Abdomen:soft, non-tender; bowel sounds normal; no masses,  no organomegaly Extremities negative Neuro: alert, oriented, normal speech, no focal findings or movement disorder noted  Lab Results: Lab Results  Component Value Date   WBC 7.8 04/20/2012   HGB 11.5* 04/20/2012   HCT 33.7* 04/20/2012   MCV 78.9* 04/20/2012   PLT 124* 04/20/2012     Chemistry      Component Value Date/Time   NA 140 04/20/2012 1111   NA 138 09/30/2011 1130   K 3.5 04/20/2012 1111   K 4.3 09/30/2011 1130   CL 107 04/20/2012 1111   CL 104 09/30/2011 1130   CO2 22 04/20/2012 1111   CO2 25 09/30/2011 1130   BUN 20.0 04/20/2012 1111   BUN 21 09/30/2011 1130   CREATININE 0.8 04/20/2012 1111   CREATININE 0.96 09/30/2011 1130      Component Value Date/Time   CALCIUM 9.2 04/20/2012 1111   CALCIUM 9.2 09/30/2011 1130   ALKPHOS 96 04/20/2012 1111   ALKPHOS 98 09/30/2011 1130   AST 13 04/20/2012 1111   AST 22 09/30/2011 1130   ALT 18 04/20/2012 1111   ALT 21 09/30/2011 1130   BILITOT 0.40 04/20/2012 1111   BILITOT 0.7 09/30/2011 1130      .pathology. Radiological Studies: chest X-ray n/a Mammogram  Bone density   Impression and Plan: Doris Jackson is doing well without obvious evidence of recurrence, I will see her in a years time. We discussed stopping letrozole also is as a 2/5 years. She does have quite a bit of bone joint pain this may help her get back to exercising. [facility] Her hemoglobin is borderline and with low MCV. We have evaluated this in the past and we will see submitted parotid for another hemoglobinopathy evaluation. Her platelets are borderline as well, she may have some underlying hospital nightly which  may be contributing to this as.  I will see her in a years time. She is taking additional vitamin D. her vitamin D levels is acceptable. More than 50% of the visit was spent in patient-related counselling   Doris Crane,  MD 10/29/201311:42 AM

## 2012-04-27 NOTE — Telephone Encounter (Signed)
Gave patient appointment for 2014 

## 2012-04-27 NOTE — Patient Instructions (Addendum)
[facility] "[facility]".Marland Kitchen

## 2012-04-28 ENCOUNTER — Encounter: Payer: Self-pay | Admitting: Internal Medicine

## 2012-04-28 ENCOUNTER — Ambulatory Visit (INDEPENDENT_AMBULATORY_CARE_PROVIDER_SITE_OTHER): Payer: Medicare Other | Admitting: Internal Medicine

## 2012-04-28 VITALS — BP 120/88 | Temp 97.4°F | Wt 246.0 lb

## 2012-04-28 DIAGNOSIS — I10 Essential (primary) hypertension: Secondary | ICD-10-CM

## 2012-04-28 DIAGNOSIS — E119 Type 2 diabetes mellitus without complications: Secondary | ICD-10-CM

## 2012-04-28 DIAGNOSIS — Z23 Encounter for immunization: Secondary | ICD-10-CM

## 2012-04-28 MED ORDER — PRAVASTATIN SODIUM 40 MG PO TABS: 40.0000 mg | ORAL_TABLET | Freq: Every day | ORAL | Status: DC

## 2012-04-28 MED ORDER — PIOGLITAZONE HCL 45 MG PO TABS: 45.0000 mg | ORAL_TABLET | Freq: Every day | ORAL | Status: DC

## 2012-04-28 MED ORDER — METFORMIN HCL 1000 MG PO TABS: ORAL_TABLET | ORAL | Status: DC

## 2012-04-28 NOTE — Patient Instructions (Addendum)

## 2012-04-28 NOTE — Progress Notes (Signed)
Subjective:    Patient ID: Doris Jackson, female    DOB: [birth year], 71 y.o.   MRN: [id number]  HPI  71 year old patient who is seen today for followup. She has type 2 diabetes. She's had a difficult time with the cost issues due to her diabetic and other medications. She has received the heavy sampling. In the past she has been very reluctant to consider Actos do to side effect concerns. This issue was discussed at length today and she is willing to try Actos. The plan also is to complete her present supply of Januvia and then discontinue.  She will then attempt to control her diabetes with a triple drug regimen of generics. Welchol  will be discontinued. She has treated hypertension which has been stable. She is followed by oncology do to right breast cancer. She has a history of osteoarthritis and low back pain that limits her activity. She has exogenous obesity. She remains on pravastatin for dyslipidemia  Past Medical History  Diagnosis Date  . ALLERGIC RHINITIS 06/17/2010  . ANEMIA-NOS 06/17/2010  . BREAST CANCER, HX OF 06/17/2010  . COPD 06/17/2010  . HYPERLIPIDEMIA 06/17/2010  . LOW BACK PAIN 06/17/2010  . OSTEOARTHRITIS 06/17/2010  . OSTEOPENIA 06/17/2010  . Leg swelling   . Weakness   . DIABETES MELLITUS, TYPE II 06/17/2010  . HYPERTENSION 06/17/2010  . Cancer 2008    right breast  . Fibroid     FIBROID  . Endometriosis   . Cataract     History   Social History  . Marital Status: Married    Spouse Name: N/A    Number of Children: N/A  . Years of Education: N/A   Occupational History  . Not on file.   Social History Main Topics  . Smoking status: Never Smoker   . Smokeless tobacco: Never Used  . Alcohol Use: No  . Drug Use: No  . Sexually Active: No   Other Topics Concern  . Not on file   Social History Narrative  . No narrative on file    Past Surgical History  Procedure Date  . Abdominal hysterectomy 1988    TAH,LSO  . Oophorectomy 1988    TAH,LSO   . Tubal ligation   . Breast lumpectomy 2007    LUMPECTOMY FOLLOWED BY RADIATION    Family History  Problem Relation Age of Onset  . Diabetes Mother   . Hypertension Sister     No Known Allergies  Current Outpatient Prescriptions on File Prior to Visit  Medication Sig Dispense Refill  . amLODipine-benazepril (LOTREL) 5-40 MG per capsule Take 1 capsule by mouth daily.  90 capsule  1  . Cholecalciferol (VITAMIN D) 2000 UNITS CAPS Take 1 capsule by mouth daily. Taking 2 caps daily 4,000      . CLEVER CHEK TEST test strip USE TWICE DAILY AS DIRECTED FOR GLUCOSE TESTING AND MONITORING  200 each  0  . cyanocobalamin 2000 MCG tablet Take 2,000 mcg by mouth daily.        Marland Kitchen FERREX 150 150 MG capsule TAKE ONE CAPSULE BY MOUTH EVERY DAY  30 capsule  2  . furosemide (LASIX) 20 MG tablet Take 1 tablet (20 mg total) by mouth daily as needed.  30 tablet  0  . glipiZIDE (GLUCOTROL XL) 10 MG 24 hr tablet TAKE 1 TABLET BY MOUTH TWICE A DAY  90 tablet  3  . hydrochlorothiazide (HYDRODIURIL) 25 MG tablet Take 1 tablet (25 mg total) by mouth daily.  90 tablet  4  . JANUVIA 100 MG tablet TAKE 1 TABLET BY MOUTH IN THE MORNING  90 each  0  . Lancets 30G MISC USE TWICE DAILY AS DIRECTED FOR GLUCOSE TESTING AND MONITORING  200 each  4  . Magnesium 250 MG TABS Take 1 tablet by mouth as needed.       . Multiple Vitamin (MULTIVITAMIN) tablet Take 1 tablet by mouth daily.        . TOPROL XL 50 MG 24 hr tablet TAKE 1 TABLET BY MOUTH IN THE MORNING  90 tablet  3  . DISCONTD: metFORMIN (GLUCOPHAGE) 1000 MG tablet TAKE 1 TABLET BY MOUTH TWICE A DAY WITH FOOD  180 tablet  2  . pioglitazone (ACTOS) 45 MG tablet Take 1 tablet (45 mg total) by mouth daily.  90 tablet  2    BP 120/88  Temp 97.4 F (36.3 C) (Oral)  Wt 246 lb (111.585 kg)     Review of Systems  Constitutional: Negative.   HENT: Negative for hearing loss, congestion, sore throat, rhinorrhea, dental problem, sinus pressure and tinnitus.   Eyes:  Negative for pain, discharge and visual disturbance.  Respiratory: Negative for cough and shortness of breath.   Cardiovascular: Negative for chest pain, palpitations and leg swelling.  Gastrointestinal: Negative for nausea, vomiting, abdominal pain, diarrhea, constipation, blood in stool and abdominal distention.  Genitourinary: Negative for dysuria, urgency, frequency, hematuria, flank pain, vaginal bleeding, vaginal discharge, difficulty urinating, vaginal pain and pelvic pain.  Musculoskeletal: Positive for back pain, arthralgias and gait problem. Negative for joint swelling.  Skin: Negative for rash.  Neurological: Negative for dizziness, syncope, speech difficulty, weakness, numbness and headaches.  Hematological: Negative for adenopathy.  Psychiatric/Behavioral: Negative for behavioral problems, dysphoric mood and agitation. The patient is not nervous/anxious.        Objective:   Physical Exam  Constitutional: She is oriented to person, place, and time. She appears well-developed and well-nourished.       Weight 246 Blood pressure 120/82  HENT:  Head: Normocephalic.  Right Ear: External ear normal.  Left Ear: External ear normal.  Mouth/Throat: Oropharynx is clear and moist.  Eyes: Conjunctivae normal and EOM are normal. Pupils are equal, round, and reactive to light.  Neck: Normal range of motion. Neck supple. No thyromegaly present.  Cardiovascular: Normal rate, regular rhythm, normal heart sounds and intact distal pulses.   Pulmonary/Chest: Effort normal and breath sounds normal.  Abdominal: Soft. Bowel sounds are normal. She exhibits no mass. There is no tenderness.  Musculoskeletal: Normal range of motion.  Lymphadenopathy:    She has no cervical adenopathy.  Neurological: She is alert and oriented to person, place, and time.  Skin: Skin is warm and dry. No rash noted.  Psychiatric: She has a normal mood and affect. Her behavior is normal.          Assessment &  Plan:   Diabetes mellitus. Will attempt to control on triple generic oral medication. She Januvia will be discontinued after present supply. WelChol also will be discontinued Hypertension stable Dyslipidemia. The patient apparently has not taken pravastatin. This will be refilled.

## 2012-04-29 ENCOUNTER — Ambulatory Visit: Payer: Medicare Other | Admitting: Internal Medicine

## 2012-04-29 ENCOUNTER — Telehealth: Payer: Self-pay | Admitting: Internal Medicine

## 2012-04-29 NOTE — Telephone Encounter (Signed)
Please advise 

## 2012-04-29 NOTE — Telephone Encounter (Signed)
Spoke with pt - asked to call around to shop prices then let me know which pharm to call in rx .

## 2012-04-29 NOTE — Telephone Encounter (Signed)
No other alternatives- have patient shop other drug stores to include [facility] and [facility]

## 2012-04-29 NOTE — Telephone Encounter (Signed)
Caller: Gelila/Patient; Patient Name: Doris Jackson; PCP: Eleonore Chiquito [facility]); Best Callback Phone Number: [phone] Calling regarding prescription for Actos, states was prescribed 04/28/12, when she went to get from pharmacy was over 100 dollars, wants to know if something else can be prescribed and called to [facility] [address]  PLEASE CALL PT IF ANOTHER MEDICATION TO BE CALLED IN. Thanks.

## 2012-05-13 ENCOUNTER — Encounter: Payer: Self-pay | Admitting: Obstetrics and Gynecology

## 2012-05-18 ENCOUNTER — Encounter: Payer: Self-pay | Admitting: Obstetrics and Gynecology

## 2012-05-18 ENCOUNTER — Other Ambulatory Visit: Payer: Self-pay | Admitting: Radiology

## 2012-06-10 ENCOUNTER — Telehealth: Payer: Self-pay | Admitting: Internal Medicine

## 2012-06-10 NOTE — Telephone Encounter (Signed)
Suggest return office visit in January to discuss all options with her new drug benefit plan

## 2012-06-10 NOTE — Telephone Encounter (Signed)
Patient has 3 medication updates/questions 1. States that her Amlodipine is costing her $74 a month and would like to know if there is anything cheaper that she can take.  2.  [employer] sent her a letter stating that they were going to discontinue coverage of her Toprol beginning January 1st and would like to make note she may not be able to afford it following January 1st. 3.  She did not get the Actos for her sugar control because it was $174 at every pharmacy.  Patient states she is taking 1/2 tab in the morning and 1 tab at night and if sugar is high she is taking 1 tab in the morning and 1 tab at night.   OFFICE NOTE: PLEASE FOLLOW UP WITH PATIENT IF THERE IS FURTHER INSTRUCTION

## 2012-06-18 ENCOUNTER — Telehealth: Payer: Self-pay | Admitting: Internal Medicine

## 2012-06-18 NOTE — Telephone Encounter (Signed)
Spoke to pt told her only have samples of Januvia will put at front desk for her to pick up on Monday. Pt verbalized understanding.

## 2012-06-18 NOTE — Telephone Encounter (Signed)
Called because never heard back after message 06/10/12 regarding medication questions. Currently in Medicare donut hole. Amlodipine is a hardship at$75/month, Toprol XL will not be covered by insurance 06/30/12 and never started Actos due to cost.  So "is cutting back on bread"   FBS 151; ranging 140-150.  Informed per Epic message Dr Lesia Hausen 06/10/12 to make appointment for January 2014 to discuss medications with new health plan.  Transferred to office scheduler/Latoya for appointment for Jan 2014.   Requesting samples of Actos and Januvia.  Please call back to advise if samples available.

## 2012-06-25 ENCOUNTER — Other Ambulatory Visit: Payer: Self-pay | Admitting: Internal Medicine

## 2012-06-29 ENCOUNTER — Other Ambulatory Visit: Payer: Self-pay | Admitting: Internal Medicine

## 2012-07-07 ENCOUNTER — Encounter: Payer: Self-pay | Admitting: Internal Medicine

## 2012-07-07 ENCOUNTER — Ambulatory Visit (INDEPENDENT_AMBULATORY_CARE_PROVIDER_SITE_OTHER): Payer: Medicare Other | Admitting: Internal Medicine

## 2012-07-07 VITALS — BP 140/76 | HR 84 | Temp 97.8°F | Resp 20 | Wt 247.0 lb

## 2012-07-07 DIAGNOSIS — I10 Essential (primary) hypertension: Secondary | ICD-10-CM

## 2012-07-07 DIAGNOSIS — Z853 Personal history of malignant neoplasm of breast: Secondary | ICD-10-CM

## 2012-07-07 DIAGNOSIS — E119 Type 2 diabetes mellitus without complications: Secondary | ICD-10-CM

## 2012-07-07 DIAGNOSIS — E785 Hyperlipidemia, unspecified: Secondary | ICD-10-CM

## 2012-07-07 DIAGNOSIS — M1990 Unspecified osteoarthritis, unspecified site: Secondary | ICD-10-CM

## 2012-07-07 MED ORDER — AMLODIPINE BESY-BENAZEPRIL HCL 5-40 MG PO CAPS: 1.0000 | ORAL_CAPSULE | Freq: Every day | ORAL | Status: DC

## 2012-07-07 MED ORDER — SITAGLIPTIN PHOSPHATE 100 MG PO TABS: 100.0000 mg | ORAL_TABLET | Freq: Every day | ORAL | Status: DC

## 2012-07-07 MED ORDER — PRAVASTATIN SODIUM 40 MG PO TABS: 40.0000 mg | ORAL_TABLET | Freq: Every day | ORAL | Status: DC

## 2012-07-07 MED ORDER — PIOGLITAZONE HCL 45 MG PO TABS: 45.0000 mg | ORAL_TABLET | Freq: Every day | ORAL | Status: DC

## 2012-07-07 MED ORDER — GLIPIZIDE ER 10 MG PO TB24: 10.0000 mg | ORAL_TABLET | Freq: Every day | ORAL | Status: DC

## 2012-07-07 MED ORDER — HYDROCHLOROTHIAZIDE 25 MG PO TABS: 25.0000 mg | ORAL_TABLET | Freq: Every day | ORAL | Status: DC

## 2012-07-07 MED ORDER — METFORMIN HCL 1000 MG PO TABS: ORAL_TABLET | ORAL | Status: DC

## 2012-07-07 MED ORDER — FUROSEMIDE 20 MG PO TABS: 20.0000 mg | ORAL_TABLET | Freq: Every day | ORAL | Status: DC | PRN

## 2012-07-07 NOTE — Progress Notes (Signed)
Subjective:    Patient ID: Doris Jackson, female    DOB: October 22, 1940, 72 y.o.   MRN: [id number]  HPI  72 year old patient who is seen today for her quarterly followup. She has treated hypertension dyslipidemia and diabetes. She is followed by orthopedics to 2 arthritis. She has knee and low back pain. Also followed by oncology do to right breast cancer. To have a high exam in the fall of last year. No concerns or complaints except for orthopedic. She's had a difficult time with her medications although they are generic except for Januvia  Past Medical History  Diagnosis Date  . ALLERGIC RHINITIS 06/17/2010  . ANEMIA-NOS 06/17/2010  . BREAST CANCER, HX OF 06/17/2010  . COPD 06/17/2010  . HYPERLIPIDEMIA 06/17/2010  . LOW BACK PAIN 06/17/2010  . OSTEOARTHRITIS 06/17/2010  . OSTEOPENIA 06/17/2010  . Leg swelling   . Weakness   . DIABETES MELLITUS, TYPE II 06/17/2010  . HYPERTENSION 06/17/2010  . Cancer 2008    right breast  . Fibroid     FIBROID  . Endometriosis   . Cataract     History   Social History  . Marital Status: Married    Spouse Name: N/A    Number of Children: N/A  . Years of Education: N/A   Occupational History  . Not on file.   Social History Main Topics  . Smoking status: Never Smoker   . Smokeless tobacco: Never Used  . Alcohol Use: No  . Drug Use: No  . Sexually Active: No   Other Topics Concern  . Not on file   Social History Narrative  . No narrative on file    Past Surgical History  Procedure Date  . Abdominal hysterectomy 1988    TAH,LSO  . Oophorectomy 1988    TAH,LSO  . Tubal ligation   . Breast lumpectomy 2007    LUMPECTOMY FOLLOWED BY RADIATION    Family History  Problem Relation Age of Onset  . Diabetes Mother   . Hypertension Sister     No Known Allergies  Current Outpatient Prescriptions on File Prior to Visit  Medication Sig Dispense Refill  . amLODipine-benazepril (LOTREL) 5-40 MG per capsule Take 1 capsule by mouth  daily.  90 capsule  1  . Cholecalciferol (VITAMIN D) 2000 UNITS CAPS Take 1 capsule by mouth daily. Taking 2 caps daily 4,000      . CLEVER CHEK TEST test strip USE TWICE DAILY AS DIRECTED FOR GLUCOSE TESTING AND MONITORING  200 each  0  . cyanocobalamin 2000 MCG tablet Take 2,000 mcg by mouth daily.        Marland Kitchen FERREX 150 150 MG capsule TAKE ONE CAPSULE BY MOUTH EVERY DAY  30 capsule  2  . furosemide (LASIX) 20 MG tablet Take 1 tablet (20 mg total) by mouth daily as needed.  30 tablet  0  . glipiZIDE (GLUCOTROL XL) 10 MG 24 hr tablet TAKE 1 TABLET BY MOUTH TWICE A DAY  90 tablet  3  . hydrochlorothiazide (HYDRODIURIL) 25 MG tablet Take 1 tablet (25 mg total) by mouth daily.  90 tablet  4  . JANUVIA 100 MG tablet TAKE 1 TABLET BY MOUTH IN THE MORNING  90 each  0  . Lancets 30G MISC USE TWICE DAILY AS DIRECTED FOR GLUCOSE TESTING AND MONITORING  200 each  4  . Magnesium 250 MG TABS Take 1 tablet by mouth as needed.       . metFORMIN (GLUCOPHAGE) 1000 MG  tablet One half tablet in the morning and one tablet in the afternoon      . Multiple Vitamin (MULTIVITAMIN) tablet Take 1 tablet by mouth daily.        . pioglitazone (ACTOS) 45 MG tablet Take 1 tablet (45 mg total) by mouth daily.  90 tablet  2  . pravastatin (PRAVACHOL) 40 MG tablet Take 1 tablet (40 mg total) by mouth daily.  90 tablet  3  . TOPROL XL 50 MG 24 hr tablet TAKE 1 TABLET BY MOUTH IN THE MORNING  90 tablet  2    BP 140/76  Pulse 84  Temp 97.8 F (36.6 C) (Oral)  Resp 20  Wt 247 lb (112.038 kg)  SpO2 97%     Review of Systems  Constitutional: Negative.   HENT: Negative for hearing loss, congestion, sore throat, rhinorrhea, dental problem, sinus pressure and tinnitus.   Eyes: Negative for pain, discharge and visual disturbance.  Respiratory: Negative for cough and shortness of breath.   Cardiovascular: Negative for chest pain, palpitations and leg swelling.  Gastrointestinal: Negative for nausea, vomiting, abdominal pain,  diarrhea, constipation, blood in stool and abdominal distention.  Genitourinary: Negative for dysuria, urgency, frequency, hematuria, flank pain, vaginal bleeding, vaginal discharge, difficulty urinating, vaginal pain and pelvic pain.  Musculoskeletal: Positive for back pain, arthralgias and gait problem. Negative for joint swelling.  Skin: Negative for rash.  Neurological: Negative for dizziness, syncope, speech difficulty, weakness, numbness and headaches.  Hematological: Negative for adenopathy.  Psychiatric/Behavioral: Negative for behavioral problems, dysphoric mood and agitation. The patient is not nervous/anxious.        Objective:   Physical Exam  Constitutional: She is oriented to person, place, and time. She appears well-developed and well-nourished.       Repeat blood pressure 120/80  HENT:  Head: Normocephalic.  Right Ear: External ear normal.  Left Ear: External ear normal.  Mouth/Throat: Oropharynx is clear and moist.  Eyes: Conjunctivae normal and EOM are normal. Pupils are equal, round, and reactive to light.  Neck: Normal range of motion. Neck supple. No thyromegaly present.  Cardiovascular: Normal rate, regular rhythm, normal heart sounds and intact distal pulses.   Pulmonary/Chest: Effort normal and breath sounds normal.  Abdominal: Soft. Bowel sounds are normal. She exhibits no mass. There is no tenderness.  Musculoskeletal: Normal range of motion.  Lymphadenopathy:    She has no cervical adenopathy.  Neurological: She is alert and oriented to person, place, and time.  Skin: Skin is warm and dry. No rash noted.  Psychiatric: She has a normal mood and affect. Her behavior is normal.          Assessment & Plan:    Diabetes mellitus. Will check a hemoglobin A1c next visit Hypertension well controlled Dyslipidemia Osteoarthritis  All medications dispensed Samples provided  Recheck 3 months

## 2012-07-07 NOTE — Patient Instructions (Signed)
Limit your sodium (Salt) intake    It is important that you exercise regularly, at least 20 minutes 3 to 4 times per week.  If you develop chest pain or shortness of breath seek  medical attention.   Please check your hemoglobin A1c every 3 months  You need to lose weight.  Consider a lower calorie diet and regular exercise.   

## 2012-07-08 ENCOUNTER — Telehealth: Payer: Self-pay | Admitting: Internal Medicine

## 2012-07-08 NOTE — Telephone Encounter (Signed)
Patient was in the office 07/07/12 to see Dr. Kirtland Bouchard.   She is confused regarding her Diabetes medication-   (1)  Actos 45mg  - is she suppose to add this to her other three diabetes medication ?  Glucotrol XL, Glucophage and Januvia ? (2) Glucotrol XL 10mg  -  Dr. Kirtland Bouchard. Put on her paper that she is to take it ONCE A DAY.  She states she takes it BID and has for 3-4 years.  Has this changed ? (3) Does her Glucophage dosage stay the same 500mg  in morning and 1000mg  at night (4) Does she continue the Januvia? (5)  Does she take the Actos in the morning or night?  Please follow up with patient . She is confused with medication.  Reviewed Epic . All medications were reordered. Please [phone]

## 2012-07-09 NOTE — Telephone Encounter (Signed)
Spoke to pt told her to continue glipizide extended-release 10 mg once daily-that she is now on an extended release formulation and once today is adequate,  Actos once daily- may take in the morning or at bedtime,  Continue Januvia once daily,  Continue metformin as directed per Dr. Amador Cunas. Pt verbalized understanding.

## 2012-07-09 NOTE — Telephone Encounter (Signed)
Continue glipizide extended-release 10 mg once daily-notify patient that she is now on an extended release formulation and once today is adequate Actos once daily-  may take in the morning or at bedtime Continue Januvia once daily Continue metformin as directed

## 2012-07-09 NOTE — Telephone Encounter (Signed)
Patient calling in for update about her Diabetes medication dosages .  Reviewed EPIC and advised her of Dr. Amador Cunas findings (Please see note from 07/08/12).  Patient will follow instructions given to her on her discharge sheet. She understand the medication dosages and strengths and when to take them. Advised to call back for any questions, changes or concerns.

## 2012-08-05 ENCOUNTER — Telehealth: Payer: Self-pay | Admitting: Internal Medicine

## 2012-08-05 NOTE — Telephone Encounter (Signed)
Caller: Doris Jackson/Patient; Phone: [phone]; Reason for Call: Patient is having issues with Diabetic supplies coming to her from [facility].  She states they are sending her Diabetic supples and she wants them discontinued.  She states she called [facility] and had people from [facility] today trying to get them to stop.  [facility] states that they can stop it until the Physicians office fax them a stop order.  Please call [phone] and then Fax - [phone] and have [facility] stop sending her supplies.  She gets her supplies from [facility]. PATIENT IS VERY UPSET . PLEASE CONTACT HER FOR UPDATE [phone]

## 2012-08-06 NOTE — Telephone Encounter (Signed)
Called [facility] and spoke to [place], told her pt very upset need to stop sending diabetic supplies to pt they were never ordered thru you. [place] said they have a order on file from 11/13/2010. Told her we never order thru them. She said need to send order to discontinue supplies. Told her okay. Order faxed to [phone] to discontinue all diabetic supplies immediately.  Left message on voicemail for pt, tried home number busy will try again later.

## 2012-08-07 ENCOUNTER — Telehealth: Payer: Self-pay | Admitting: *Deleted

## 2012-08-07 ENCOUNTER — Encounter: Payer: Self-pay | Admitting: *Deleted

## 2012-08-07 NOTE — Telephone Encounter (Signed)
Per patient reassignment I have contacted the patient. I have explained that Dr.Rubin is no longer with the patient, but has reviewed her chart and wants Dr. Darnelle Catalan to follow up with her care. Patient agrees, and appts made.  Letter mailed.  JMW

## 2012-08-09 ENCOUNTER — Telehealth: Payer: Self-pay | Admitting: Internal Medicine

## 2012-08-09 NOTE — Telephone Encounter (Signed)
Caller: Libertie/Patient; Phone: [phone]; Reason for Call: Patient is returning call from office that she thinks is from billing regarding closing out a diabetic claim for supplies.  Caller did not leave a name just the phone number.  Please follow up with patient as needed.  She will be home for the rest of the afternoon.

## 2012-08-09 NOTE — Telephone Encounter (Signed)
Left message on voicemail.

## 2012-08-09 NOTE — Telephone Encounter (Signed)
Spoke to pt told her I sent order to [facility] to cancel all diabetic supplies. Pt verbalized understanding.

## 2012-08-24 ENCOUNTER — Other Ambulatory Visit: Payer: Self-pay | Admitting: Internal Medicine

## 2012-09-10 ENCOUNTER — Telehealth: Payer: Self-pay | Admitting: Internal Medicine

## 2012-09-10 NOTE — Telephone Encounter (Signed)
Patient called stating that she would like samples of Januvia 100mg  1poqd. Please assist.

## 2012-09-15 NOTE — Telephone Encounter (Signed)
Spoke to pt told her I do have samples of Januvia 100 mg, 2 month supply. Will put at front desk for pick up. Pt verbalized understanding. Samples put at front.

## 2012-09-23 ENCOUNTER — Telehealth: Payer: Self-pay | Admitting: Internal Medicine

## 2012-09-23 NOTE — Telephone Encounter (Signed)
Med needed prior auth. Spoke w/[facility]. Toprol XL (brand name) approved 09/23/12 - 06/29/13. LMOM for pt to make aware. Encounter closed.

## 2012-09-23 NOTE — Telephone Encounter (Signed)
Pt states medicare is going to stop paying for TOPROL XL 50 MG 24 hr tablet. Pt received letter in Dec stating this. Pt has only one week of meds left. Pt needs something to replace TOPROL. Pls advise. [facility] [address]

## 2012-09-30 ENCOUNTER — Telehealth: Payer: Self-pay | Admitting: *Deleted

## 2012-09-30 NOTE — Telephone Encounter (Signed)
Left message on voicemail to call office on home and cell number.

## 2012-10-04 ENCOUNTER — Telehealth: Payer: Self-pay | Admitting: Internal Medicine

## 2012-10-04 MED ORDER — GLUCOSE BLOOD VI STRP: 1.0000 | ORAL_STRIP | Freq: Two times a day (BID) | Status: DC | PRN

## 2012-10-04 NOTE — Telephone Encounter (Signed)
Caller: Doris Jackson/Patient; Phone: [phone]; Reason for Call: Is returning call to office concerning testing strips for Accu Check test strips.  Uses compact plus and gets from [facility] and the need new prescription.  Phone number [phone] for pharmacy/[facility].

## 2012-10-04 NOTE — Telephone Encounter (Signed)
Pt notified test strips were called into [facility].

## 2012-11-15 ENCOUNTER — Telehealth: Payer: Self-pay | Admitting: Internal Medicine

## 2012-11-15 NOTE — Telephone Encounter (Signed)
PT calling to request a refill of her glucose blood (ACCU-CHEK COMPACT PLUS) test strips. She would [facility] [address]. Please assist.

## 2012-11-16 MED ORDER — GLUCOSE BLOOD VI STRP: 1.0000 | ORAL_STRIP | Freq: Two times a day (BID) | Status: DC | PRN

## 2012-11-16 NOTE — Telephone Encounter (Signed)
Left detailed message that Rx for test strips was sent to pharmacy as requested.

## 2012-12-09 ENCOUNTER — Encounter: Payer: Self-pay | Admitting: Internal Medicine

## 2012-12-09 ENCOUNTER — Ambulatory Visit (INDEPENDENT_AMBULATORY_CARE_PROVIDER_SITE_OTHER): Payer: Medicare Other | Admitting: Internal Medicine

## 2012-12-09 VITALS — BP 120/74 | HR 85 | Temp 97.6°F | Resp 20 | Wt 250.0 lb

## 2012-12-09 DIAGNOSIS — E119 Type 2 diabetes mellitus without complications: Secondary | ICD-10-CM

## 2012-12-09 DIAGNOSIS — I10 Essential (primary) hypertension: Secondary | ICD-10-CM

## 2012-12-09 DIAGNOSIS — E785 Hyperlipidemia, unspecified: Secondary | ICD-10-CM

## 2012-12-09 DIAGNOSIS — J309 Allergic rhinitis, unspecified: Secondary | ICD-10-CM

## 2012-12-09 DIAGNOSIS — M1990 Unspecified osteoarthritis, unspecified site: Secondary | ICD-10-CM

## 2012-12-09 NOTE — Progress Notes (Signed)
Subjective:    Patient ID: Doris Jackson, female    DOB: 23-Feb-1941, 72 y.o.   MRN: [id number]  HPI  72 year old patient who is seen today for followup of her type 2 diabetes. Her last hemoglobin A1c 7.4. She has a history of exogenous obesity. She was last seen in January. At that time she was placed on Actos. She apparently discontinued glipizide at that time. She states a fasting blood sugars are generally very well controlled all less than 100 the blood sugars are generally in the 175 the 200 range following her evening meal. She has treated hypertension and a history of dyslipidemia. She has remote history of right breast cancer. No other concerns or complaints except for some allergy related issues. She is using Allegra daily  Past Medical History  Diagnosis Date  . ALLERGIC RHINITIS 06/17/2010  . ANEMIA-NOS 06/17/2010  . BREAST CANCER, HX OF 06/17/2010  . COPD 06/17/2010  . HYPERLIPIDEMIA 06/17/2010  . LOW BACK PAIN 06/17/2010  . OSTEOARTHRITIS 06/17/2010  . OSTEOPENIA 06/17/2010  . Leg swelling   . Weakness   . DIABETES MELLITUS, TYPE II 06/17/2010  . HYPERTENSION 06/17/2010  . Cancer 2008    right breast  . Fibroid     FIBROID  . Endometriosis   . Cataract     History   Social History  . Marital Status: Married    Spouse Name: N/A    Number of Children: N/A  . Years of Education: N/A   Occupational History  . Not on file.   Social History Main Topics  . Smoking status: Never Smoker   . Smokeless tobacco: Never Used  . Alcohol Use: No  . Drug Use: No  . Sexually Active: No   Other Topics Concern  . Not on file   Social History Narrative  . No narrative on file    Past Surgical History  Procedure Laterality Date  . Abdominal hysterectomy  1988    TAH,LSO  . Oophorectomy  1988    TAH,LSO  . Tubal ligation    . Breast lumpectomy  2007    LUMPECTOMY FOLLOWED BY RADIATION    Family History  Problem Relation Age of Onset  . Diabetes Mother   .  Hypertension Sister     No Known Allergies  Current Outpatient Prescriptions on File Prior to Visit  Medication Sig Dispense Refill  . amLODipine-benazepril (LOTREL) 5-40 MG per capsule Take 1 capsule by mouth daily.  90 capsule  1  . Cholecalciferol (VITAMIN D) 2000 UNITS CAPS Take 1 capsule by mouth daily. Taking 2 caps daily 4,000      . cyanocobalamin 2000 MCG tablet Take 2,000 mcg by mouth daily.        Marland Kitchen FERREX 150 150 MG capsule TAKE ONE CAPSULE BY MOUTH EVERY DAY  30 capsule  2  . furosemide (LASIX) 20 MG tablet Take 1 tablet (20 mg total) by mouth daily as needed.  30 tablet  0  . glucose blood (ACCU-CHEK COMPACT PLUS) test strip 1 each by Other route 2 (two) times daily as needed for other. Use as instructed  204 each  3  . hydrochlorothiazide (HYDRODIURIL) 25 MG tablet Take 1 tablet (25 mg total) by mouth daily.  90 tablet  4  . Lancets 30G MISC USE TWICE DAILY AS DIRECTED FOR GLUCOSE TESTING AND MONITORING  200 each  4  . Magnesium 250 MG TABS Take 1 tablet by mouth as needed.       Marland Kitchen  metFORMIN (GLUCOPHAGE) 1000 MG tablet One half tablet in the morning and one tablet in the afternoon      . Multiple Vitamin (MULTIVITAMIN) tablet Take 1 tablet by mouth daily.        . pioglitazone (ACTOS) 45 MG tablet Take 1 tablet (45 mg total) by mouth daily.  90 tablet  2  . pravastatin (PRAVACHOL) 40 MG tablet Take 1 tablet (40 mg total) by mouth daily.  90 tablet  3  . sitaGLIPtin (JANUVIA) 100 MG tablet Take 1 tablet (100 mg total) by mouth daily.  90 tablet  6  . TOPROL XL 50 MG 24 hr tablet TAKE 1 TABLET BY MOUTH IN THE MORNING  90 tablet  2   No current facility-administered medications on file prior to visit.    BP 120/74  Pulse 85  Temp(Src) 97.6 F (36.4 C) (Oral)  Resp 20  Wt 250 lb (113.399 kg)  BMI 45.71 kg/m2  SpO2 97%       Review of Systems  Constitutional: Negative.   HENT: Positive for congestion and rhinorrhea. Negative for hearing loss, sore throat, dental  problem, sinus pressure and tinnitus.   Eyes: Negative for pain, discharge and visual disturbance.  Respiratory: Negative for cough and shortness of breath.   Cardiovascular: Negative for chest pain, palpitations and leg swelling.  Gastrointestinal: Negative for nausea, vomiting, abdominal pain, diarrhea, constipation, blood in stool and abdominal distention.  Genitourinary: Negative for dysuria, urgency, frequency, hematuria, flank pain, vaginal bleeding, vaginal discharge, difficulty urinating, vaginal pain and pelvic pain.  Musculoskeletal: Negative for joint swelling, arthralgias and gait problem.  Skin: Negative for rash.  Neurological: Negative for dizziness, syncope, speech difficulty, weakness, numbness and headaches.  Hematological: Negative for adenopathy.  Psychiatric/Behavioral: Negative for behavioral problems, dysphoric mood and agitation. The patient is not nervous/anxious.        Objective:   Physical Exam  Constitutional: She is oriented to person, place, and time. She appears well-developed and well-nourished.  HENT:  Head: Normocephalic.  Right Ear: External ear normal.  Left Ear: External ear normal.  Mouth/Throat: Oropharynx is clear and moist.  Eyes: Conjunctivae and EOM are normal. Pupils are equal, round, and reactive to light.  Neck: Normal range of motion. Neck supple. No thyromegaly present.  Cardiovascular: Normal rate, regular rhythm, normal heart sounds and intact distal pulses.   Pulmonary/Chest: Effort normal and breath sounds normal.  Abdominal: Soft. Bowel sounds are normal. She exhibits no mass. There is no tenderness.  Musculoskeletal: Normal range of motion.  Lymphadenopathy:    She has no cervical adenopathy.  Neurological: She is alert and oriented to person, place, and time.  Skin: Skin is warm and dry. No rash noted.  Psychiatric: She has a normal mood and affect. Her behavior is normal.          Assessment & Plan:   Diabetes mellitus.  Will check a hemoglobin A1c. Will consider resuming glipizide Hypertension excellent control Exogenous obesity. Weight loss encouraged. Dyslipidemia. Continue pravastatin. Allergic rhinitis. Samples of Nasonex dispense

## 2012-12-09 NOTE — Patient Instructions (Signed)
Please check your hemoglobin A1c every 3 months  Limit your sodium (Salt) intake    It is important that you exercise regularly, at least 20 minutes 3 to 4 times per week.  If you develop chest pain or shortness of breath seek  medical attention.  You need to lose weight.  Consider a lower calorie diet and regular exercise.   Please check your hemoglobin A1c every 3 months

## 2012-12-13 ENCOUNTER — Ambulatory Visit (INDEPENDENT_AMBULATORY_CARE_PROVIDER_SITE_OTHER): Payer: Medicare Other | Admitting: Internal Medicine

## 2012-12-13 DIAGNOSIS — Z299 Encounter for prophylactic measures, unspecified: Secondary | ICD-10-CM

## 2012-12-15 LAB — TB SKIN TEST: Induration: 0 mm

## 2013-01-04 ENCOUNTER — Encounter: Payer: Self-pay | Admitting: Internal Medicine

## 2013-01-04 ENCOUNTER — Ambulatory Visit (INDEPENDENT_AMBULATORY_CARE_PROVIDER_SITE_OTHER): Payer: Medicare Other | Admitting: Internal Medicine

## 2013-01-04 VITALS — BP 128/80 | HR 90 | Temp 98.3°F | Resp 20 | Wt 250.0 lb

## 2013-01-04 DIAGNOSIS — J309 Allergic rhinitis, unspecified: Secondary | ICD-10-CM

## 2013-01-04 DIAGNOSIS — E119 Type 2 diabetes mellitus without complications: Secondary | ICD-10-CM

## 2013-01-04 DIAGNOSIS — I10 Essential (primary) hypertension: Secondary | ICD-10-CM

## 2013-01-04 MED ORDER — BENZONATATE 200 MG PO CAPS: 200.0000 mg | ORAL_CAPSULE | Freq: Two times a day (BID) | ORAL | Status: DC | PRN

## 2013-01-04 NOTE — Patient Instructions (Signed)
Dymista  use twice daily  Take over-the-counter expectorants and cough medications such as  Mucinex DM.  Call if there is no improvement in 5 to 7 days or if he developed worsening cough, fever, or new symptoms, such as shortness of breath or chest pain.

## 2013-01-04 NOTE — Progress Notes (Signed)
Subjective:    Patient ID: Doris Jackson, female    DOB: [birth year], 72 y.o.   MRN: [id number]  HPI  72 year old patient who presents with a chief complaint of cough this has been present for about 4 weeks. She is scheduled to see allergy medicine in the near future. She describes a mucous production and worsening cough at night. She has been using Allegra every other day with some benefit. She's also use Tessalon past with improvement. Medical regimen does include ACE inhibition for hypertension. Her husband is recovering from a URI  Past Medical History  Diagnosis Date  . ALLERGIC RHINITIS 06/17/2010  . ANEMIA-NOS 06/17/2010  . BREAST CANCER, HX OF 06/17/2010  . COPD 06/17/2010  . HYPERLIPIDEMIA 06/17/2010  . LOW BACK PAIN 06/17/2010  . OSTEOARTHRITIS 06/17/2010  . OSTEOPENIA 06/17/2010  . Leg swelling   . Weakness   . DIABETES MELLITUS, TYPE II 06/17/2010  . HYPERTENSION 06/17/2010  . Cancer 2008    right breast  . Fibroid     FIBROID  . Endometriosis   . Cataract     History   Social History  . Marital Status: Married    Spouse Name: N/A    Number of Children: N/A  . Years of Education: N/A   Occupational History  . Not on file.   Social History Main Topics  . Smoking status: Never Smoker   . Smokeless tobacco: Never Used  . Alcohol Use: No  . Drug Use: No  . Sexually Active: No   Other Topics Concern  . Not on file   Social History Narrative  . No narrative on file    Past Surgical History  Procedure Laterality Date  . Abdominal hysterectomy  1988    TAH,LSO  . Oophorectomy  1988    TAH,LSO  . Tubal ligation    . Breast lumpectomy  2007    LUMPECTOMY FOLLOWED BY RADIATION    Family History  Problem Relation Age of Onset  . Diabetes Mother   . Hypertension Sister     No Known Allergies  Current Outpatient Prescriptions on File Prior to Visit  Medication Sig Dispense Refill  . amLODipine-benazepril (LOTREL) 5-40 MG per capsule Take 1  capsule by mouth daily.  90 capsule  1  . Cholecalciferol (VITAMIN D) 2000 UNITS CAPS Take 1 capsule by mouth daily. Taking 2 caps daily 4,000      . cyanocobalamin 2000 MCG tablet Take 2,000 mcg by mouth daily.        Marland Kitchen FERREX 150 150 MG capsule TAKE ONE CAPSULE BY MOUTH EVERY DAY  30 capsule  2  . furosemide (LASIX) 20 MG tablet Take 1 tablet (20 mg total) by mouth daily as needed.  30 tablet  0  . glucose blood (ACCU-CHEK COMPACT PLUS) test strip 1 each by Other route 2 (two) times daily as needed for other. Use as instructed  204 each  3  . hydrochlorothiazide (HYDRODIURIL) 25 MG tablet Take 1 tablet (25 mg total) by mouth daily.  90 tablet  4  . Lancets 30G MISC USE TWICE DAILY AS DIRECTED FOR GLUCOSE TESTING AND MONITORING  200 each  4  . Magnesium 250 MG TABS Take 1 tablet by mouth as needed.       . metFORMIN (GLUCOPHAGE) 1000 MG tablet One half tablet in the morning and one tablet in the afternoon      . Multiple Vitamin (MULTIVITAMIN) tablet Take 1 tablet by mouth daily.        Marland Kitchen  pioglitazone (ACTOS) 45 MG tablet Take 1 tablet (45 mg total) by mouth daily.  90 tablet  2  . pravastatin (PRAVACHOL) 40 MG tablet Take 1 tablet (40 mg total) by mouth daily.  90 tablet  3  . sitaGLIPtin (JANUVIA) 100 MG tablet Take 1 tablet (100 mg total) by mouth daily.  90 tablet  6  . TOPROL XL 50 MG 24 hr tablet TAKE 1 TABLET BY MOUTH IN THE MORNING  90 tablet  2   No current facility-administered medications on file prior to visit.    BP 128/80  Pulse 90  Temp(Src) 98.3 F (36.8 C) (Oral)  Resp 20  Wt 250 lb (113.399 kg)  BMI 45.71 kg/m2  SpO2 95%       Review of Systems  Constitutional: Negative.   HENT: Positive for congestion, rhinorrhea and postnasal drip. Negative for hearing loss, sore throat, dental problem, sinus pressure and tinnitus.   Eyes: Negative for pain, discharge and visual disturbance.  Respiratory: Positive for cough. Negative for shortness of breath.    Cardiovascular: Negative for chest pain, palpitations and leg swelling.  Gastrointestinal: Negative for nausea, vomiting, abdominal pain, diarrhea, constipation, blood in stool and abdominal distention.  Genitourinary: Negative for dysuria, urgency, frequency, hematuria, flank pain, vaginal bleeding, vaginal discharge, difficulty urinating, vaginal pain and pelvic pain.  Musculoskeletal: Negative for joint swelling, arthralgias and gait problem.  Skin: Negative for rash.  Neurological: Negative for dizziness, syncope, speech difficulty, weakness, numbness and headaches.  Hematological: Negative for adenopathy.  Psychiatric/Behavioral: Negative for behavioral problems, dysphoric mood and agitation. The patient is not nervous/anxious.        Objective:   Physical Exam  Constitutional: She is oriented to person, place, and time. She appears well-developed and well-nourished.  HENT:  Head: Normocephalic.  Right Ear: External ear normal.  Left Ear: External ear normal.  Mouth/Throat: Oropharynx is clear and moist.  Eyes: Conjunctivae and EOM are normal. Pupils are equal, round, and reactive to light.  Neck: Normal range of motion. Neck supple. No thyromegaly present.  Cardiovascular: Normal rate, regular rhythm, normal heart sounds and intact distal pulses.   Pulmonary/Chest: Effort normal and breath sounds normal. No respiratory distress. She has no wheezes. She has no rales.  O2 saturation 95%  Abdominal: Soft. Bowel sounds are normal. She exhibits no mass. There is no tenderness.  Musculoskeletal: Normal range of motion.  Lymphadenopathy:    She has no cervical adenopathy.  Neurological: She is alert and oriented to person, place, and time.  Skin: Skin is warm and dry. No rash noted.  Psychiatric: She has a normal mood and affect. Her behavior is normal.          Assessment & Plan:   Viral URI with cough. We'll treat symptomatically Allergic rhinitis diabetes hypertension well  controlled  Followup in 3 months as scheduled

## 2013-01-18 ENCOUNTER — Other Ambulatory Visit: Payer: Self-pay | Admitting: Internal Medicine

## 2013-01-21 ENCOUNTER — Other Ambulatory Visit: Payer: Self-pay | Admitting: Internal Medicine

## 2013-01-27 ENCOUNTER — Telehealth: Payer: Self-pay | Admitting: Internal Medicine

## 2013-01-27 NOTE — Telephone Encounter (Signed)
Patient Information:  Caller Name: Cherell  Phone: [phone]  Patient: Doris Jackson, Doris Jackson  Gender: Female  DOB: 1940/10/31  Age: 72 Years  PCP: Eleonore Chiquito [facility])  Office Follow Up:  Does the office need to follow up with this patient?: Yes  Instructions For The Office: PLS READ RN NOTE  RN Note:  Pt had fasting blood sugar of 197 on 7-31, states her morning finger sticks have been high for 2 weeks after switching from Glipizide to Actos the beginning of 2014. Afebrile. Pt has no symptoms at time of call.  PLEASE DISCUSS ELVATED BLOOD SUGAR W/ DR Lesia Hausen AND F/U W/ PT AT [phone] IF NOT REACHABLE AT HOME NUMBER.  Symptoms  Reason For Call & Symptoms: Elevated Blood Sugar  Reviewed Health History In EMR: Yes  Reviewed Medications In EMR: Yes  Reviewed Allergies In EMR: Yes  Reviewed Surgeries / Procedures: Yes  Date of Onset of Symptoms: 01/13/2013  Treatments Tried: Actos,  Januvia, Metformin  Treatments Tried Worked: No  Guideline(s) Used:  Diabetes - High Blood Sugar  Disposition Per Guideline:   Discuss with PCP and Callback by Nurse Today  Reason For Disposition Reached:   Caller has NON-URGENT medication question about med that PCP prescribed and triager unable to answer question  Advice Given:  N/A  Patient Will Follow Care Advice:  YES

## 2013-01-27 NOTE — Telephone Encounter (Signed)
Appointment made for patient to see Dr Kirtland Bouchard tomorrow

## 2013-01-28 ENCOUNTER — Ambulatory Visit (INDEPENDENT_AMBULATORY_CARE_PROVIDER_SITE_OTHER): Payer: Medicare Other | Admitting: Internal Medicine

## 2013-01-28 ENCOUNTER — Encounter: Payer: Self-pay | Admitting: Internal Medicine

## 2013-01-28 VITALS — BP 122/70 | HR 85 | Temp 98.0°F | Resp 20 | Wt 250.0 lb

## 2013-01-28 DIAGNOSIS — R32 Unspecified urinary incontinence: Secondary | ICD-10-CM

## 2013-01-28 DIAGNOSIS — E119 Type 2 diabetes mellitus without complications: Secondary | ICD-10-CM

## 2013-01-28 DIAGNOSIS — I10 Essential (primary) hypertension: Secondary | ICD-10-CM

## 2013-01-28 DIAGNOSIS — R350 Frequency of micturition: Secondary | ICD-10-CM

## 2013-01-28 LAB — POCT URINALYSIS DIPSTICK
Glucose, UA: NEGATIVE
Ketones, UA: NEGATIVE
Leukocytes, UA: NEGATIVE
Spec Grav, UA: 1.015
Urobilinogen, UA: 0.2

## 2013-01-28 MED ORDER — GLIPIZIDE ER 5 MG PO TB24: 5.0000 mg | ORAL_TABLET | Freq: Every day | ORAL | Status: DC

## 2013-01-28 NOTE — Progress Notes (Signed)
Subjective:    Patient ID: Doris Jackson, female    DOB: 1941/01/09, 72 y.o.   MRN: [id number]  HPI  72 year old patient who is seen today for followup. She has hypertension and diabetes. She was placed on Actos in January and the patient misunderstood instructions and discontinued  glipizide at that time.  When seen in followup 2 months ago fasting blood sugars were frequently 100 and the medication was not resumed. For the past couple weeks she has had worsening glycemic control with fasting blood sugars 170-190. Postprandial late afternoon and evening blood sugars have been as high as 2:30. She generally feels well  Past Medical History  Diagnosis Date  . ALLERGIC RHINITIS 06/17/2010  . ANEMIA-NOS 06/17/2010  . BREAST CANCER, HX OF 06/17/2010  . COPD 06/17/2010  . HYPERLIPIDEMIA 06/17/2010  . LOW BACK PAIN 06/17/2010  . OSTEOARTHRITIS 06/17/2010  . OSTEOPENIA 06/17/2010  . Leg swelling   . Weakness   . DIABETES MELLITUS, TYPE II 06/17/2010  . HYPERTENSION 06/17/2010  . Cancer 2008    right breast  . Fibroid     FIBROID  . Endometriosis   . Cataract     History   Social History  . Marital Status: Married    Spouse Name: N/A    Number of Children: N/A  . Years of Education: N/A   Occupational History  . Not on file.   Social History Main Topics  . Smoking status: Never Smoker   . Smokeless tobacco: Never Used  . Alcohol Use: No  . Drug Use: No  . Sexually Active: No   Other Topics Concern  . Not on file   Social History Narrative  . No narrative on file    Past Surgical History  Procedure Laterality Date  . Abdominal hysterectomy  1988    TAH,LSO  . Oophorectomy  1988    TAH,LSO  . Tubal ligation    . Breast lumpectomy  2007    LUMPECTOMY FOLLOWED BY RADIATION    Family History  Problem Relation Age of Onset  . Diabetes Mother   . Hypertension Sister     No Known Allergies  Current Outpatient Prescriptions on File Prior to Visit  Medication  Sig Dispense Refill  . amLODipine-benazepril (LOTREL) 5-40 MG per capsule TAKE ONE CAPSULE BY MOUTH EVERY DAY  15 capsule  4  . benzonatate (TESSALON) 200 MG capsule Take 1 capsule (200 mg total) by mouth 2 (two) times daily as needed for cough.  30 capsule  2  . Cholecalciferol (VITAMIN D) 2000 UNITS CAPS Take 1 capsule by mouth daily. Taking 2 caps daily 4,000      . cyanocobalamin 2000 MCG tablet Take 2,000 mcg by mouth daily.        Marland Kitchen FERREX 150 150 MG capsule TAKE ONE CAPSULE BY MOUTH EVERY DAY  30 capsule  2  . furosemide (LASIX) 20 MG tablet Take 1 tablet (20 mg total) by mouth daily as needed.  30 tablet  0  . glucose blood (ACCU-CHEK COMPACT PLUS) test strip 1 each by Other route 2 (two) times daily as needed for other. Use as instructed  204 each  3  . hydrochlorothiazide (HYDRODIURIL) 25 MG tablet TAKE 1 TABLET BY MOUTH EVERY DAY  90 tablet  4  . Lancets 30G MISC USE TWICE DAILY AS DIRECTED FOR GLUCOSE TESTING AND MONITORING  200 each  4  . Magnesium 250 MG TABS Take 1 tablet by mouth as needed.       Marland Kitchen  Multiple Vitamin (MULTIVITAMIN) tablet Take 1 tablet by mouth daily.        . pioglitazone (ACTOS) 45 MG tablet Take 1 tablet (45 mg total) by mouth daily.  90 tablet  2  . pravastatin (PRAVACHOL) 40 MG tablet Take 1 tablet (40 mg total) by mouth daily.  90 tablet  3  . sitaGLIPtin (JANUVIA) 100 MG tablet Take 1 tablet (100 mg total) by mouth daily.  90 tablet  6  . TOPROL XL 50 MG 24 hr tablet TAKE 1 TABLET BY MOUTH IN THE MORNING  90 tablet  2   No current facility-administered medications on file prior to visit.    BP 122/70  Pulse 85  Temp(Src) 98 F (36.7 C) (Oral)  Resp 20  Wt 250 lb (113.399 kg)  BMI 45.71 kg/m2  SpO2 98%       Review of Systems  Constitutional: Negative.   HENT: Negative for hearing loss, congestion, sore throat, rhinorrhea, dental problem, sinus pressure and tinnitus.   Eyes: Negative for pain, discharge and visual disturbance.  Respiratory:  Negative for cough and shortness of breath.   Cardiovascular: Negative for chest pain, palpitations and leg swelling.  Gastrointestinal: Negative for nausea, vomiting, abdominal pain, diarrhea, constipation, blood in stool and abdominal distention.  Genitourinary: Negative for dysuria, urgency, frequency, hematuria, flank pain, vaginal bleeding, vaginal discharge, difficulty urinating, vaginal pain and pelvic pain.  Musculoskeletal: Negative for joint swelling, arthralgias and gait problem.  Skin: Negative for rash.  Neurological: Negative for dizziness, syncope, speech difficulty, weakness, numbness and headaches.  Hematological: Negative for adenopathy.  Psychiatric/Behavioral: Negative for behavioral problems, dysphoric mood and agitation. The patient is not nervous/anxious.        Objective:   Physical Exam  Constitutional: She appears well-developed and well-nourished. No distress.          Assessment & Plan:   Hypertension well controlled Diabetes. Will resume glipizide at a dose of 5 mg daily. We'll see in 3 months for annual physical. Hemoglobin A1c checked at that time

## 2013-01-28 NOTE — Patient Instructions (Signed)
Please check your hemoglobin A1c every 3 months  Limit your sodium (Salt) intake  You need to lose weight.  Consider a lower calorie diet and regular exercise.  Please see your eye doctor yearly to check for diabetic eye damage

## 2013-03-16 ENCOUNTER — Other Ambulatory Visit: Payer: Self-pay | Admitting: Internal Medicine

## 2013-03-21 ENCOUNTER — Other Ambulatory Visit: Payer: Self-pay | Admitting: Internal Medicine

## 2013-03-21 ENCOUNTER — Telehealth: Payer: Self-pay | Admitting: Internal Medicine

## 2013-03-21 NOTE — Telephone Encounter (Signed)
Patient Information:  Caller Name: Vergene  Phone: [phone]  Patient: Doris Jackson, Doris Jackson  Gender: Female  DOB: 1941-05-06  Age: 72 Years  PCP: Eleonore Chiquito [facility])  Office Follow Up:  Does the office need to follow up with this patient?: No  Instructions For The Office: N/A   Symptoms  Reason For Call & Symptoms: Pt calling that she has arthritis and since 03/19/13 she ahs been having more pain in hips.  They seem to be spasms that "move around" and hurt especially at night.  Is there anything he can give her for this.   Pain is intermittent.  Triaged Hip Non injury from [facility] and needs to be seen in 2 weeks for occasional swelling or stiffness an may involve other joints.  Reviewed Health History In EMR: Yes  Reviewed Medications In EMR: Yes  Reviewed Allergies In EMR: Yes  Reviewed Surgeries / Procedures: Yes  Date of Onset of Symptoms: 03/19/2013  Guideline(s) Used:  No Protocol Available - Sick Adult  Disposition Per Guideline:   See Within 2 Weeks in Office  Reason For Disposition Reached:   Nursing judgment  Advice Given:  Call Back If:  New symptoms develop  You become worse.  Patient Will Follow Care Advice:  YES  Appointment Scheduled:  03/22/2013 14:30:00 Appointment Scheduled Provider:  Eleonore Chiquito (Family Practice > 66yrs old)

## 2013-03-22 ENCOUNTER — Encounter: Payer: Self-pay | Admitting: Internal Medicine

## 2013-03-22 ENCOUNTER — Ambulatory Visit (INDEPENDENT_AMBULATORY_CARE_PROVIDER_SITE_OTHER): Payer: Medicare Other | Admitting: Internal Medicine

## 2013-03-22 VITALS — BP 136/80 | HR 81 | Temp 98.2°F | Wt 247.0 lb

## 2013-03-22 DIAGNOSIS — M545 Low back pain, unspecified: Secondary | ICD-10-CM

## 2013-03-22 DIAGNOSIS — E119 Type 2 diabetes mellitus without complications: Secondary | ICD-10-CM

## 2013-03-22 DIAGNOSIS — M1990 Unspecified osteoarthritis, unspecified site: Secondary | ICD-10-CM

## 2013-03-22 DIAGNOSIS — I10 Essential (primary) hypertension: Secondary | ICD-10-CM

## 2013-03-22 MED ORDER — CYCLOBENZAPRINE HCL 10 MG PO TABS: 10.0000 mg | ORAL_TABLET | Freq: Three times a day (TID) | ORAL | Status: DC | PRN

## 2013-03-22 NOTE — Progress Notes (Signed)
Subjective:    Patient ID: Doris Jackson, female    DOB: 04/02/1941, 72 y.o.   MRN: [id number]  HPI  72 year old patient who has history of hypertension and diabetes. She is scheduled for a complete exam in 2 months.  She presents with a chief complaint of back and left hip pain which has been present for 4-5 days. In the past today she feels improved. She does have a history of osteoarthritis and low back pain. She has been using Vicodin with some benefit. No radicular symptoms;  no history of trauma;  no constitutional complaints  Past Medical History  Diagnosis Date  . ALLERGIC RHINITIS 06/17/2010  . ANEMIA-NOS 06/17/2010  . BREAST CANCER, HX OF 06/17/2010  . COPD 06/17/2010  . HYPERLIPIDEMIA 06/17/2010  . LOW BACK PAIN 06/17/2010  . OSTEOARTHRITIS 06/17/2010  . OSTEOPENIA 06/17/2010  . Leg swelling   . Weakness   . DIABETES MELLITUS, TYPE II 06/17/2010  . HYPERTENSION 06/17/2010  . Cancer 2008    right breast  . Fibroid     FIBROID  . Endometriosis   . Cataract     History   Social History  . Marital Status: Married    Spouse Name: N/A    Number of Children: N/A  . Years of Education: N/A   Occupational History  . Not on file.   Social History Main Topics  . Smoking status: Never Smoker   . Smokeless tobacco: Never Used  . Alcohol Use: No  . Drug Use: No  . Sexual Activity: No   Other Topics Concern  . Not on file   Social History Narrative  . No narrative on file    Past Surgical History  Procedure Laterality Date  . Abdominal hysterectomy  1988    TAH,LSO  . Oophorectomy  1988    TAH,LSO  . Tubal ligation    . Breast lumpectomy  2007    LUMPECTOMY FOLLOWED BY RADIATION    Family History  Problem Relation Age of Onset  . Diabetes Mother   . Hypertension Sister     No Known Allergies  Current Outpatient Prescriptions on File Prior to Visit  Medication Sig Dispense Refill  . amLODipine-benazepril (LOTREL) 5-40 MG per capsule TAKE ONE  CAPSULE BY MOUTH EVERY DAY  15 capsule  4  . benzonatate (TESSALON) 200 MG capsule Take 1 capsule (200 mg total) by mouth 2 (two) times daily as needed for cough.  30 capsule  2  . Cholecalciferol (VITAMIN D) 2000 UNITS CAPS Take 1 capsule by mouth daily. Taking 2 caps daily 4,000      . cyanocobalamin 2000 MCG tablet Take 2,000 mcg by mouth daily.        Marland Kitchen FERREX 150 150 MG capsule TAKE ONE CAPSULE BY MOUTH EVERY DAY  30 capsule  2  . fexofenadine (ALLEGRA) 180 MG tablet Take 180 mg by mouth daily.      . furosemide (LASIX) 20 MG tablet Take 1 tablet (20 mg total) by mouth daily as needed.  30 tablet  0  . glipiZIDE (GLUCOTROL XL) 5 MG 24 hr tablet Take 1 tablet (5 mg total) by mouth daily.  90 tablet  5  . glucose blood (ACCU-CHEK COMPACT PLUS) test strip 1 each by Other route 2 (two) times daily as needed for other. Use as instructed  204 each  3  . hydrochlorothiazide (HYDRODIURIL) 25 MG tablet TAKE 1 TABLET BY MOUTH EVERY DAY  90 tablet  4  .  HYDROcodone-acetaminophen (NORCO/VICODIN) 5-325 MG per tablet Take 1 tablet by mouth every 6 (six) hours as needed for pain.      . Lancets 30G MISC USE TWICE DAILY AS DIRECTED FOR GLUCOSE TESTING AND MONITORING  200 each  4  . Magnesium 250 MG TABS Take 1 tablet by mouth as needed.       . metFORMIN (GLUCOPHAGE) 1000 MG tablet       . metoprolol succinate (TOPROL-XL) 50 MG 24 hr tablet TAKE 1 TABLET BY MOUTH IN THE MORNING  90 tablet  1  . metoprolol succinate (TOPROL-XL) 50 MG 24 hr tablet TAKE 1 TABLET BY MOUTH IN THE MORNING  90 tablet  1  . Multiple Vitamin (MULTIVITAMIN) tablet Take 1 tablet by mouth daily.        . pioglitazone (ACTOS) 45 MG tablet Take 1 tablet (45 mg total) by mouth daily.  90 tablet  2  . pravastatin (PRAVACHOL) 40 MG tablet Take 1 tablet (40 mg total) by mouth daily.  90 tablet  3  . sitaGLIPtin (JANUVIA) 100 MG tablet Take 1 tablet (100 mg total) by mouth daily.  90 tablet  6   No current facility-administered medications  on file prior to visit.    BP 136/80  Pulse 81  Temp(Src) 98.2 F (36.8 C) (Oral)  Wt 247 lb (112.038 kg)  BMI 45.17 kg/m2  SpO2 98%    Review of Systems  Constitutional: Negative.   HENT: Negative for hearing loss, congestion, sore throat, rhinorrhea, dental problem, sinus pressure and tinnitus.   Eyes: Negative for pain, discharge and visual disturbance.  Respiratory: Negative for cough and shortness of breath.   Cardiovascular: Negative for chest pain, palpitations and leg swelling.  Gastrointestinal: Negative for nausea, vomiting, abdominal pain, diarrhea, constipation, blood in stool and abdominal distention.  Genitourinary: Negative for dysuria, urgency, frequency, hematuria, flank pain, vaginal bleeding, vaginal discharge, difficulty urinating, vaginal pain and pelvic pain.  Musculoskeletal: Positive for back pain and gait problem. Negative for joint swelling and arthralgias.  Skin: Negative for rash.  Neurological: Negative for dizziness, syncope, speech difficulty, weakness, numbness and headaches.  Hematological: Negative for adenopathy.  Psychiatric/Behavioral: Negative for behavioral problems, dysphoric mood and agitation. The patient is not nervous/anxious.        Objective:   Physical Exam  Constitutional: She is oriented to person, place, and time. She appears well-developed and well-nourished.  HENT:  Head: Normocephalic.  Right Ear: External ear normal.  Left Ear: External ear normal.  Mouth/Throat: Oropharynx is clear and moist.  Eyes: Conjunctivae and EOM are normal. Pupils are equal, round, and reactive to light.  Neck: Normal range of motion. Neck supple. No thyromegaly present.  Cardiovascular: Normal rate, regular rhythm, normal heart sounds and intact distal pulses.   Pulmonary/Chest: Effort normal and breath sounds normal.  Abdominal: Soft. Bowel sounds are normal. She exhibits no mass. There is no tenderness.  Musculoskeletal: Normal range of  motion.  Negative straight leg test  mild discomfort with range of motion of the left hip  Lymphadenopathy:    She has no cervical adenopathy.  Neurological: She is alert and oriented to person, place, and time.  Skin: Skin is warm and dry. No rash noted.  Psychiatric: She has a normal mood and affect. Her behavior is normal.          Assessment & Plan:    DJD/LBP DM2 HTN  Wt loss encouraged Add flexeril prn cpx as scheduled

## 2013-03-22 NOTE — Patient Instructions (Signed)
Limit your sodium (Salt) intake  Most patients with low back pain will improve with time over the next two to 6 weeks.  Keep active but avoid any activities that cause pain.  Apply moist heat to the low back area several times daily. 

## 2013-03-29 ENCOUNTER — Other Ambulatory Visit: Payer: Self-pay | Admitting: *Deleted

## 2013-03-29 MED ORDER — TIZANIDINE HCL 4 MG PO TABS: 4.0000 mg | ORAL_TABLET | Freq: Three times a day (TID) | ORAL | Status: DC | PRN

## 2013-03-29 NOTE — Telephone Encounter (Signed)
Spoke to pt told her needed to change muscle spasm medication Flexeril due to insurance will not cover. New Rx for Tizanidine 4 mg one tablet by mouth every 8 hours as needed was sent to pharmacy. Pt verbalized understanding. Rx sent

## 2013-03-30 ENCOUNTER — Telehealth: Payer: Self-pay | Admitting: *Deleted

## 2013-03-30 NOTE — Telephone Encounter (Signed)
Pt cannot come in on a Tuesday, needs a Monday.  Confirmed 04/11/13 appt w/ pt.  Mailed calendar to pt.

## 2013-03-31 ENCOUNTER — Emergency Department (HOSPITAL_COMMUNITY)
Admission: EM | Admit: 2013-03-31 | Discharge: 2013-03-31 | Disposition: A | Discharge status: Home / Self Care | Payer: Medicare Other | Source: Home / Self Care | Attending: Emergency Medicine | Admitting: Emergency Medicine

## 2013-03-31 ENCOUNTER — Other Ambulatory Visit: Payer: Self-pay | Admitting: Internal Medicine

## 2013-03-31 ENCOUNTER — Encounter (HOSPITAL_COMMUNITY): Payer: Self-pay | Admitting: Emergency Medicine

## 2013-03-31 ENCOUNTER — Telehealth: Payer: Self-pay | Admitting: Internal Medicine

## 2013-03-31 ENCOUNTER — Emergency Department (INDEPENDENT_AMBULATORY_CARE_PROVIDER_SITE_OTHER): Payer: Medicare Other

## 2013-03-31 DIAGNOSIS — M1990 Unspecified osteoarthritis, unspecified site: Secondary | ICD-10-CM

## 2013-03-31 MED ORDER — NAPROXEN 375 MG PO TABS: 375.0000 mg | ORAL_TABLET | Freq: Two times a day (BID) | ORAL | Status: DC

## 2013-03-31 NOTE — Telephone Encounter (Signed)
fyi

## 2013-03-31 NOTE — ED Provider Notes (Signed)
CSN: [id number]     Arrival date & time 03/31/13  1346 History   First MD Initiated Contact with Patient 03/31/13 1440     Chief Complaint  Patient presents with  . Hip Pain   (Consider location/radiation/quality/duration/timing/severity/associated sxs/prior Treatment) Patient is a 72 y.o. female presenting with hip pain. The history is provided by the patient.  Hip Pain This is a new problem. Episode onset: 2 weeks ago. The problem occurs constantly. The problem has not changed since onset.Pertinent negatives include no abdominal pain. The symptoms are aggravated by walking, standing and intercourse. Nothing relieves the symptoms. Treatments tried: saw own pcp and was given flexeril. The treatment provided no relief.    Past Medical History  Diagnosis Date  . ALLERGIC RHINITIS 06/17/2010  . ANEMIA-NOS 06/17/2010  . BREAST CANCER, HX OF 06/17/2010  . COPD 06/17/2010  . HYPERLIPIDEMIA 06/17/2010  . LOW BACK PAIN 06/17/2010  . OSTEOARTHRITIS 06/17/2010  . OSTEOPENIA 06/17/2010  . Leg swelling   . Weakness   . DIABETES MELLITUS, TYPE II 06/17/2010  . HYPERTENSION 06/17/2010  . Cancer 2008    right breast  . Fibroid     FIBROID  . Endometriosis   . Cataract    Past Surgical History  Procedure Laterality Date  . Abdominal hysterectomy  1988    TAH,LSO  . Oophorectomy  1988    TAH,LSO  . Tubal ligation    . Breast lumpectomy  2007    LUMPECTOMY FOLLOWED BY RADIATION   Family History  Problem Relation Age of Onset  . Diabetes Mother   . Hypertension Sister    History  Substance Use Topics  . Smoking status: Never Smoker   . Smokeless tobacco: Never Used  . Alcohol Use: No   OB History   Grav Para Term Preterm Abortions TAB SAB Ect Mult Living   3 3 3   0     2     Review of Systems  Constitutional: Negative for fever and chills.  Gastrointestinal: Negative for abdominal pain.  Musculoskeletal:       Hip pain on L  Skin: Negative for color change.     Allergies  Review of patient's allergies indicates no known allergies.  Home Medications   Current Outpatient Rx  Name  Route  Sig  Dispense  Refill  . amLODipine-benazepril (LOTREL) 5-40 MG per capsule      TAKE ONE CAPSULE BY MOUTH EVERY DAY   15 capsule   4   . benzonatate (TESSALON) 200 MG capsule   Oral   Take 1 capsule (200 mg total) by mouth 2 (two) times daily as needed for cough.   30 capsule   2   . Cholecalciferol (VITAMIN D) 2000 UNITS CAPS   Oral   Take 1 capsule by mouth daily. Taking 2 caps daily 4,000         . cyanocobalamin 2000 MCG tablet   Oral   Take 2,000 mcg by mouth daily.           . cyclobenzaprine (FLEXERIL) 10 MG tablet   Oral   Take 1 tablet (10 mg total) by mouth 3 (three) times daily as needed for muscle spasms.   30 tablet   4   . FERREX 150 150 MG capsule      TAKE ONE CAPSULE BY MOUTH EVERY DAY   30 capsule   2   . fexofenadine (ALLEGRA) 180 MG tablet   Oral   Take 180 mg by  mouth daily.         . furosemide (LASIX) 20 MG tablet   Oral   Take 1 tablet (20 mg total) by mouth daily as needed.   30 tablet   0     For swelling - please call pt when ready for pick  ...   . glipiZIDE (GLUCOTROL XL) 5 MG 24 hr tablet   Oral   Take 1 tablet (5 mg total) by mouth daily.   90 tablet   5   . glucose blood (ACCU-CHEK COMPACT PLUS) test strip   Other   1 each by Other route 2 (two) times daily as needed for other. Use as instructed   204 each   3     Dx: 250.00   . hydrochlorothiazide (HYDRODIURIL) 25 MG tablet      TAKE 1 TABLET BY MOUTH EVERY DAY   90 tablet   4   . HYDROcodone-acetaminophen (NORCO/VICODIN) 5-325 MG per tablet   Oral   Take 1 tablet by mouth every 6 (six) hours as needed for pain.         . Lancets 30G MISC      USE TWICE DAILY AS DIRECTED FOR GLUCOSE TESTING AND MONITORING   200 each   4   . Magnesium 250 MG TABS   Oral   Take 1 tablet by mouth as needed.          . metFORMIN  (GLUCOPHAGE) 1000 MG tablet               . metoprolol succinate (TOPROL-XL) 50 MG 24 hr tablet      TAKE 1 TABLET BY MOUTH IN THE MORNING   90 tablet   1   . metoprolol succinate (TOPROL-XL) 50 MG 24 hr tablet      TAKE 1 TABLET BY MOUTH IN THE MORNING   90 tablet   1   . Multiple Vitamin (MULTIVITAMIN) tablet   Oral   Take 1 tablet by mouth daily.           . naproxen (NAPROSYN) 375 MG tablet   Oral   Take 1 tablet (375 mg total) by mouth 2 (two) times daily.   14 tablet   0   . pioglitazone (ACTOS) 45 MG tablet      TAKE 1 TABLET BY MOUTH EVERY DAY   90 tablet   0     NEEDS PHYSICAL   . pravastatin (PRAVACHOL) 40 MG tablet   Oral   Take 1 tablet (40 mg total) by mouth daily.   90 tablet   3   . sitaGLIPtin (JANUVIA) 100 MG tablet   Oral   Take 1 tablet (100 mg total) by mouth daily.   90 tablet   6   . tiZANidine (ZANAFLEX) 4 MG tablet   Oral   Take 1 tablet (4 mg total) by mouth every 8 (eight) hours as needed.   30 tablet   4    BP 131/96  Pulse 98  Temp(Src) 97.7 F (36.5 C) (Oral)  Resp 20  SpO2 97% Physical Exam  Constitutional: She appears well-developed and well-nourished. No distress.  Musculoskeletal:       Left hip: She exhibits decreased range of motion, tenderness and bony tenderness. She exhibits no swelling and no deformity.  Pain in L hip aggravated by external rotation and flexion.     ED Course  Procedures (including critical care time) Labs Review Labs Reviewed - No data  to display Imaging Review Dg Hip Complete Left  03/31/2013   CLINICAL DATA:  72 year old female with pain. Initial encounter.  EXAM: LEFT HIP - COMPLETE 2+ VIEW  COMPARISON:  None.  FINDINGS: Femoral heads normally located. Hip joint spaces preserved. Bone mineralization is within normal limits. Pelvis intact. Sacral ala and SI joints within normal limits. Proximal left femur intact. Negative visible bowel gas pattern. Pelvic phleboliths.  IMPRESSION: No  acute fracture or dislocation identified about the left hip or pelvis.   Electronically Signed   By: Augusto Gamble M.D.   On: 03/31/2013 15:12    MDM   1. Osteoarthritis   arthritis vs muscle strain. Rx naproxen 375mg  BID #14. Pt to f/u with orthopedist if no improvement of sx.      Cathlyn Parsons, NP 03/31/13 1535

## 2013-03-31 NOTE — Telephone Encounter (Signed)
Patient Information:  Caller Name: Avital  Phone: [phone]  Patient: Doris, Jackson  Gender: Female  DOB: [birth year]  Age: 72 Years  PCP: Eleonore Chiquito [facility])  Office Follow Up:  Does the office need to follow up with this patient?: No  Instructions For The Office: N/A  RN Note:  Flank pain increases with deep breath or movement. Pain rated 6-7/10.  Symptoms  Reason For Call & Symptoms: Sharp, left flank pain radiating to abdomen between waist and groin. Seen on 03/22/13. Using generic Flexaril and Hydrocodone without improvement.  Has not started Tizanidine. Last BM 03/30/13. No vomiting or diarrhea.  FBS 118 at 0930.  Reviewed Health History In EMR: Yes  Reviewed Medications In EMR: Yes  Reviewed Allergies In EMR: Yes  Reviewed Surgeries / Procedures: Yes  Date of Onset of Symptoms: 03/17/2013  Treatments Tried: Cyclobenzeprine, Hydrocodone, avoiding heavy lifting, heating pad  Treatments Tried Worked: No  Guideline(s) Used:  Flank Pain  Disposition Per Guideline:   Go to ED Now  Reason For Disposition Reached:   Abdominal pain and age > 44  Advice Given:  Call Back If:  Fever over 100.5 F (38.1 C)  Burning with urination or blood in urine  You become worse.  Patient Will Follow Care Advice:  YES

## 2013-03-31 NOTE — ED Notes (Signed)
Left hip pain, left lower back pain , pain for 2 weeks, no known injury.  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

## 2013-04-01 NOTE — ED Provider Notes (Signed)
Medical screening examination/treatment/procedure(s) were performed by non-physician practitioner and as supervising physician I was immediately available for consultation/collaboration.  Leslee Home, M.D.   Reuben Likes, MD 04/01/13 [phone]

## 2013-04-04 ENCOUNTER — Other Ambulatory Visit: Payer: Self-pay | Admitting: Internal Medicine

## 2013-04-04 ENCOUNTER — Telehealth: Payer: Self-pay | Admitting: *Deleted

## 2013-04-04 NOTE — Telephone Encounter (Signed)
Left message for pt to return my call so I can reschedule her 10/13 appt since she will be out of town.

## 2013-04-06 ENCOUNTER — Telehealth: Payer: Self-pay | Admitting: *Deleted

## 2013-04-06 NOTE — Telephone Encounter (Signed)
Left message for pt to return my call so I can schedule a new appt for her since she will be out of town on 10/13.

## 2013-04-08 ENCOUNTER — Telehealth: Payer: Self-pay | Admitting: *Deleted

## 2013-04-08 NOTE — Telephone Encounter (Signed)
Pt returned my call and I confirmed 04/21/13 appt w/ pt.  Mailed calendar to pt.

## 2013-04-11 ENCOUNTER — Ambulatory Visit: Payer: Medicare Other | Admitting: Family

## 2013-04-19 ENCOUNTER — Other Ambulatory Visit: Payer: Medicare Other

## 2013-04-20 ENCOUNTER — Other Ambulatory Visit: Payer: Medicare Other | Admitting: Lab

## 2013-04-21 ENCOUNTER — Ambulatory Visit (HOSPITAL_BASED_OUTPATIENT_CLINIC_OR_DEPARTMENT_OTHER): Payer: Medicare Other | Admitting: Family

## 2013-04-21 ENCOUNTER — Telehealth: Payer: Self-pay | Admitting: Oncology

## 2013-04-21 ENCOUNTER — Encounter: Payer: Self-pay | Admitting: Family

## 2013-04-21 DIAGNOSIS — M899 Disorder of bone, unspecified: Secondary | ICD-10-CM

## 2013-04-21 DIAGNOSIS — Z853 Personal history of malignant neoplasm of breast: Secondary | ICD-10-CM

## 2013-04-21 NOTE — Patient Instructions (Signed)
Please contact us at [phone] if you have any questions or concerns.  Please continue to do well and enjoy life!!!  Get plenty of rest, drink plenty of water, exercise daily, eat a balanced diet.  Take calcium 600 mg daily in addition to vitamin D3 1000 IUs daily.   Complete monthly self-breast examinations.  Have a clinical breast exam by a physician every year (By Dr. Amador Cunas or your Gynecologist)  Have your mammogram completed every year.

## 2013-04-21 NOTE — Progress Notes (Addendum)
[facility]  Telephone:([phone]) [phone] Fax:(336) [phone]  OFFICE PROGRESS NOTE   ID: Doris Jackson   DOB: [birth year]  MR#: [id number]  JYN#:[id number]   PCP: Rogelia Boga, MD GYN: Edyth Gunnels, M.D. SU: Gabrielle Dare. Janee Morn, M.D. RAD ONC: Maryln Gottron, M.D. ORTHO: Sheran Luz, M.D.  HISTORY OF PRESENT ILLNESS: From Dr. Theron Arista Rubin's new patient evaluation note dated 07/09/2006: "This is a 72 year old African-American woman referred by Dr. Violeta Gelinas for evaluation and treatment of breast cancer.  She is here today with her husband Doris Jackson.  This woman undergoes annual screening mammography.  She underwent a screening mammogram on 04/30/2006 that showed a mass in the upper outer quadrant of the right breast.  A diagnostic mammogram and ultrasound was performed on the same day.  There were three partially calcified smooth marginated masses, each measuring about 2.0 cm were unchanged.  In the right breast, there was a partially calcified 2.5 cm mass, which was also stable.  There was a new irregular 1.4 cm mass in the lateral outer quadrant at the 9 o'clock to 10 o'clock position.  Ultrasound of the right breast showed a worrisome mass at the 10:30 position.  This was irregular, hypoechoic and vertically oriented.  Biopsy was recommended and was performed on 05/04/2006.  This showed an invasive mammary carcinoma.  This appeared to be high-grade with a focus suspicious for lymphatic vascular invasion.  The tumor was ER/PR positive at 97% each respectively, proliferative index was 35%, and HER-2/neu was zero.  The patient did have MRI scan of both breasts.  MRI on 05/14/2006 showed enhancing mass measuring 1.5 x 1.4 x 1.4 cm.  Other areas were seen consistent with fibroadenomas.  No enlarged lymph nodes were seen.  The patient was referred to Dr. Janee Morn who ultimately took her to the operating room for a needle-localized lumpectomy on 06/04/2006.  Final pathology  showed 1.5 cm invasive ductal cancer with clear surgical margins.  This was a grade 2 lesion.  One sentinel lymph node was identified, which was negative for malignancy.  Ms. Alfieri had a relatively unremarkable postoperative course."  Her subsequent history is as detailed below.   INTERVAL HISTORY: Dr. 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  The patient was last seen by Dr. Donnie Coffin on 04/27/2012.  Since her last office visit, the patient has been doing relatively well.  She is establishing herself with Dr. Darrall Dears service today.  REVIEW OF SYSTEMS: A 10 point review of systems was completed and is negative except chronic arthritis/lower back pain for which she is being treated by Dr. Ethelene Hal.  [name] denies any other symptomatology including fatigue, fever or chills, headache, vision changes, swollen glands, cough or shortness of breath, chest pain or discomfort, nausea, vomiting, diarrhea, constipation, change in urinary or bowel habits, any other arthralgias/myalgias, unusual bleeding/bruising or any other symptomatology.   PAST MEDICAL HISTORY: Past Medical History  Diagnosis Date  . ALLERGIC RHINITIS 06/17/2010  . ANEMIA-NOS 06/17/2010  . BREAST CANCER, HX OF 06/17/2010  . COPD 06/17/2010  . HYPERLIPIDEMIA 06/17/2010  . LOW BACK PAIN 06/17/2010  . OSTEOARTHRITIS 06/17/2010  . OSTEOPENIA 06/17/2010  . Leg swelling   . Weakness   . DIABETES MELLITUS, TYPE II 06/17/2010  . HYPERTENSION 06/17/2010  . Cancer 2008    right breast  . Fibroid     FIBROID  . Endometriosis   . Cataract  PAST SURGICAL HISTORY: Past Surgical History  Procedure Laterality Date  . Abdominal hysterectomy  1988    TAH,LSO  . Oophorectomy  1988    TAH,LSO  . Tubal ligation    . Breast lumpectomy  2005-11-17    LUMPECTOMY FOLLOWED BY RADIATION    FAMILY HISTORY Family History  Problem Relation Age of Onset  .  Diabetes Mother   . Hypertension Sister   . Asthma Father   . Diabetes Brother   Both parents are deceased from complications of diabetes.  She has a brother and sister who are alive.  Both have diabetes.  There is a maternal cousin who had breast cancer, diagnosed at age 71.   GYNECOLOGIC HISTORY: Gravida 3, para 3, menarche at age 72, age of parity 10, history of uterine fibroids, menopause in 72 with hysterectomy, no history of hormone replacement therapy or birth control use.  SOCIAL HISTORY: Mr. and Mrs. Hosmer has been married since 11-18-59.  Her husband's name is Doris Jackson.  They had 3 adult children, 2 sons and one daughter all living in [place], but one son is deceased since 11-17-00.  She has 2 grandchildren and 4 great-grandchildren.  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  In her spare time she enjoys attending church Services at [employer] of [address], [address].   ADVANCED DIRECTIVES: Not on file  HEALTH MAINTENANCE: History  Substance Use Topics  . Smoking status: Never Smoker   . Smokeless tobacco: Never Used  . Alcohol Use: No    Colonoscopy:  Not on file PAP: 09/22/2011 Bone density: The patient's last bone density examination on 11/10/2011 showed a T score of -1.8 (osteopenia). Lipid panel: 10/27/2011   No Known Allergies  Current Outpatient Prescriptions  Medication Sig Dispense Refill  . amLODipine-benazepril (LOTREL) 5-40 MG per capsule TAKE ONE CAPSULE BY MOUTH EVERY DAY  30 capsule  5  . benzonatate (TESSALON) 200 MG capsule Take 1 capsule (200 mg total) by mouth 2 (two) times daily as needed for cough.  30 capsule  2  . Cholecalciferol (VITAMIN D) 2000 UNITS CAPS Take 1 capsule by mouth daily. Taking 2 caps daily 4,000      . cyanocobalamin 2000 MCG tablet Take 2,000 mcg by mouth daily.        . cyclobenzaprine (FLEXERIL) 10 MG tablet Take 1 tablet (10 mg total) by mouth 3 (three) times daily as needed for muscle spasms.  30 tablet  4  .  FERREX 150 150 MG capsule TAKE ONE CAPSULE BY MOUTH EVERY DAY  30 capsule  2  . fexofenadine (ALLEGRA) 180 MG tablet Take 180 mg by mouth daily.      . furosemide (LASIX) 20 MG tablet Take 1 tablet (20 mg total) by mouth daily as needed.  30 tablet  0  . glipiZIDE (GLUCOTROL XL) 5 MG 24 hr tablet Take 1 tablet (5 mg total) by mouth daily.  90 tablet  5  . glucose blood (ACCU-CHEK COMPACT PLUS) test strip 1 each by Other route 2 (two) times daily as needed for other. Use as instructed  204 each  3  . hydrochlorothiazide (HYDRODIURIL) 25 MG tablet TAKE 1 TABLET BY MOUTH EVERY DAY  90 tablet  4  . HYDROcodone-acetaminophen (NORCO/VICODIN) 5-325 MG per tablet Take 1 tablet by mouth every 6 (six) hours as needed for pain.      . Lancets 30G MISC USE TWICE DAILY AS DIRECTED FOR GLUCOSE TESTING AND MONITORING  200  each  4  . Magnesium 250 MG TABS Take 1 tablet by mouth as needed.       . metFORMIN (GLUCOPHAGE) 1000 MG tablet Take 1,500 mg by mouth daily with breakfast. 500 mg at breakfast and 1000 mg at dinner      . metoprolol succinate (TOPROL-XL) 50 MG 24 hr tablet TAKE 1 TABLET BY MOUTH IN THE MORNING  90 tablet  1  . pioglitazone (ACTOS) 45 MG tablet TAKE 1 TABLET BY MOUTH EVERY DAY  90 tablet  0  . pravastatin (PRAVACHOL) 40 MG tablet Take 1 tablet (40 mg total) by mouth daily.  90 tablet  3  . sitaGLIPtin (JANUVIA) 100 MG tablet Take 1 tablet (100 mg total) by mouth daily.  90 tablet  6  . Multiple Vitamin (MULTIVITAMIN) tablet Take 1 tablet by mouth daily.        . naproxen (NAPROSYN) 375 MG tablet Take 1 tablet (375 mg total) by mouth 2 (two) times daily.  14 tablet  0  . tiZANidine (ZANAFLEX) 4 MG tablet Take 1 tablet (4 mg total) by mouth every 8 (eight) hours as needed.  30 tablet  4   No current facility-administered medications for this visit.    OBJECTIVE: There were no vitals filed for this visit.   There is no weight on file to calculate BMI.      ECOG FS: 1 - Symptomatic but  completely ambulatory  General appearance: Alert, cooperative, well nourished, no apparent distress Head: Normocephalic, without obvious abnormality, atraumatic, dentures Eyes: Arcus senilis, PERRLA, EOMI Nose: Nares, septum and mucosa are normal, no drainage or sinus tenderness Neck: Supple, symmetrical, trachea midline, no tenderness Resp: Clear to auscultation bilaterally, no wheezes/rales/rhonchi Cardio: Regular rate and rhythm, S1, S2 normal, no murmur, click, rub or gallop, no edema Breasts:  Pendulous bilaterally, glandular/fibrous breast tissue bilaterally, right breast has well-healed surgical scars, left breast is visibly larger than the right breast, bilateral axillary fullness with right nodularity in right axilla area, right breast architectural and radiation changes noted  GI: Soft, distended, non-tender, hypoactive bowel sounds, no organomegaly Skin: No rashes/lesions, skin warm and dry, no erythematous areas, no cyanosis  M/S:  Atraumatic, normal strength in all extremities, limited range of motion in upper and lower extremities, no clubbing  Lymph nodes: Cervical, supraclavicular, and axillary nodes normal Neurologic: Grossly normal, cranial nerves II through XII intact, alert and oriented x 3 Psych: Appropriate affect   LAB RESULTS: Lab Results  Component Value Date   WBC 7.8 04/20/2012   NEUTROABS 4.9 04/20/2012   HGB 11.5* 04/20/2012   HCT 33.7* 04/20/2012   MCV 78.9* 04/20/2012   PLT 124* 04/20/2012      Chemistry      Component Value Date/Time   NA 140 04/20/2012 1111   NA 138 09/30/2011 1130   K 3.5 04/20/2012 1111   K 4.3 09/30/2011 1130   CL 107 04/20/2012 1111   CL 104 09/30/2011 1130   CO2 22 04/20/2012 1111   CO2 25 09/30/2011 1130   BUN 20.0 04/20/2012 1111   BUN 21 09/30/2011 1130   CREATININE 0.8 04/20/2012 1111   CREATININE 0.96 09/30/2011 1130      Component Value Date/Time   CALCIUM 9.2 04/20/2012 1111   CALCIUM 9.2 09/30/2011 1130   ALKPHOS 96  04/20/2012 1111   ALKPHOS 98 09/30/2011 1130   AST 13 04/20/2012 1111   AST 22 09/30/2011 1130   ALT 18 04/20/2012 1111   ALT 21  09/30/2011 1130   BILITOT 0.40 04/20/2012 1111   BILITOT 0.7 09/30/2011 1130       Lab Results  Component Value Date   LABCA2 27 09/30/2011    Urinalysis    Component Value Date/Time   BILIRUBINUR NEGATIVE 01/28/2013 1526   UROBILINOGEN 0.2 01/28/2013 1526   NITRITE NEGATIVE 01/28/2013 1526   LEUKOCYTESUR Negative 01/28/2013 1526     STUDIES: 1.  Dg Hip Complete Left 03/31/2013   CLINICAL DATA:  72 year old female with pain. Initial encounter.  EXAM: LEFT HIP - COMPLETE 2+ VIEW  COMPARISON:  None.  FINDINGS: Femoral heads normally located. Hip joint spaces preserved. Bone mineralization is within normal limits. Pelvis intact. Sacral ala and SI joints within normal limits. Proximal left femur intact. Negative visible bowel gas pattern. Pelvic phleboliths.  IMPRESSION: No acute fracture or dislocation identified about the left hip or pelvis.   Electronically Signed   By: Augusto Gamble M.D.   On: 03/31/2013 15:12   2.  Bilateral digital diagnostic mammogram on 05/12/2012 showed scattered parenchymal densities are present.  Increasing right breast microcalcifications are noted medially.  These are mildly pleomorphic and intermediate in character.  No concerning changes identified on the left.  Multiple benign-appearing masses are again noted.  Suspicious abnormality, biopsy should be considered.  3.  Underwent right breast needle core biopsy on 05/18/2012 which showed benign breast with calcified and hyalinized fibroadenomatoid nodules.    4.  The patient's last bone density examination on 11/10/2011 showed a T score of -1.8 (osteopenia).    ASSESSMENT:  HOANG Doris Jackson is a  72 y.o. [place]:  1.  Status post right breast needle core biopsy of the 9 o'clock position on 05/04/2006 which showed invasive mammary carcinoma, with features to suggest  high-grade invasive ductal carcinoma, estrogen receptor 97% positive, progesterone receptor 97% positive, Ki-67 35%, HER-2/neu negative.  2.  Bilateral breast MRI on 05/14/2006 showed within the upper outer quadrant of the right breast, deep within the breast tissue, there is an enhancing mass measuring 1.5 x 1.4 x 1.4 cm.  This demonstrates washout type kinetics, consistent with known malignancy in this location.   No other suspicious abnormality is identified on the right. There are numerous non-enhancing low T2 signal intensity masses bilaterally, consistent with calcified fibroadenomas as seen mammographically. No suspicious findings are noted on the left.  No enlarged intramammary or axillary lymph nodes are present (clinical stage I, T1 N0).  3.  Status post right breast needle localized lumpectomy with right sentinel node biopsy on 06/04/2006 for a, stage I pT1c, pN0, pMX, 1.5 cm invasive ductal carcinoma, grade 2, estrogen receptor 97% positive, progesterone receptor 97% positive, Ki-67 35%, HER-2/neu negative, with 0/1 metastatic right axillary lymph nodes.   4.  The patient became a participant in research study [facility]  PACCT-1 [facility] Rx study) on 07/13/2006.  5.  Oncotype DX report dated 07/24/2006 showed a recurrence score of 20 with average rate of distant recurrence at 10 years of 12%.  6.  Radiation therapy from 08/05/2006 through 09/18/2006.  7.  Antiestrogen therapy with Femara from 08/2006 through 03/2012.  8.  Osteopenia  PLAN: YINA RIVIERE  is nearing 7 years from her time of diagnosis and will officially become a graduate of [facility]'s breast cancer program today.  We asked that she continue annual clinical breast examinations by a physician in addition to annual mammography.  Her most recent mammogram results are listed above.  We will schedule her  bilateral digital diagnostic mammogram due in 04/2013 for her.  Mrs. Haught was encouraged to take calcium 600 mg daily in addition to  vitamin D3 1000 IUs daily for osteopenia.  All questions were answered.  The patient was encouraged to contact us with any problems, questions or concerns.   Larina Bras, NP-C 04/23/2013, 4:16 PM  ADDENDUM: I met with Ms. Troxler today to review her diagnosis, treatment history and prognosis. In brief:-She underwent right lumpectomy and sentinel lymph node sampling December of 2007 for a pT1c pN0, stage IA invasive ductal carcinoma, grade 2. The tumor was estrogen receptor 97% positive, progesterone receptor 97% positive, with an MIB-1 of 35%, and no HER-2 amplification.  The patient had an Oncotype DX score of 20, predicting a risk of distant relapse within 10 years of 12%, if her only systemic treatment was tamoxifen for 5 years. She was randomized according to the [facility] Rx study to receive no chemotherapy. She did receive letrozole between March of 2008 and October of 2013. Her  Because letrozole is more effective than 5 years of tamoxifen, her actual risk of recurrence at 10 years would be closer to 9%. Because 5 years have already passed, her risk of recurrence at this point is well below 5%. Accordingly I am comfortable with her "getting out of the cancer business".  She understands she will need yearly mammography and yearly physician breast exam. Of course we will be glad to see her at any point in the future if the need arises. As of now however no further routine appointments are being made for her here.   I personally saw this patient and performed a substantive portion of this encounter with the listed APP documented above.   Lowella Dell, MD

## 2013-04-26 ENCOUNTER — Ambulatory Visit: Payer: Medicare Other | Admitting: Oncology

## 2013-04-26 ENCOUNTER — Telehealth: Payer: Self-pay | Admitting: Internal Medicine

## 2013-04-26 MED ORDER — ACCU-CHEK COMPACT PLUS CARE KIT: PACK | Status: DC

## 2013-04-26 NOTE — Telephone Encounter (Signed)
Pt needs new accu-chek compact plus glucometer [facility] [address]. Pt needs glucometer today. Pt machine broke unable to check her bs this morning

## 2013-04-26 NOTE — Telephone Encounter (Signed)
Rx for Accu-chek Compact Plus Meter called into pharmacy and pt notified.

## 2013-04-27 ENCOUNTER — Ambulatory Visit: Payer: Medicare Other | Admitting: Oncology

## 2013-06-29 ENCOUNTER — Other Ambulatory Visit: Payer: Self-pay | Admitting: Internal Medicine

## 2013-07-07 ENCOUNTER — Encounter: Payer: Self-pay | Admitting: Internal Medicine

## 2013-07-07 ENCOUNTER — Ambulatory Visit (INDEPENDENT_AMBULATORY_CARE_PROVIDER_SITE_OTHER): Payer: Medicare Other | Admitting: Internal Medicine

## 2013-07-07 VITALS — BP 130/80 | HR 82 | Temp 97.4°F | Resp 20 | Wt 250.0 lb

## 2013-07-07 DIAGNOSIS — E119 Type 2 diabetes mellitus without complications: Secondary | ICD-10-CM

## 2013-07-07 DIAGNOSIS — M1990 Unspecified osteoarthritis, unspecified site: Secondary | ICD-10-CM

## 2013-07-07 DIAGNOSIS — I10 Essential (primary) hypertension: Secondary | ICD-10-CM

## 2013-07-07 DIAGNOSIS — E785 Hyperlipidemia, unspecified: Secondary | ICD-10-CM

## 2013-07-07 LAB — MICROALBUMIN / CREATININE URINE RATIO
Creatinine,U: 157.2 mg/dL
Microalb Creat Ratio: 0.1 mg/g (ref 0.0–30.0)
Microalb, Ur: 0.2 mg/dL (ref 0.0–1.9)

## 2013-07-07 LAB — HEMOGLOBIN A1C: Hgb A1c MFr Bld: 6.6 % — ABNORMAL HIGH (ref 4.6–6.5)

## 2013-07-07 MED ORDER — METRONIDAZOLE 500 MG PO TABS: 500.0000 mg | ORAL_TABLET | Freq: Three times a day (TID) | ORAL | Status: DC

## 2013-07-07 NOTE — Progress Notes (Signed)
Pre-visit discussion using our clinic review tool. No additional management support is needed unless otherwise documented below in the visit note.  

## 2013-07-07 NOTE — Progress Notes (Signed)
Subjective:    Patient ID: Doris Jackson, female    DOB: 1941-05-12, 73 y.o.   MRN: [id number]  HPI  73 year old patient who is seen today for followup of type 2 diabetes. Will recently she has enjoyed much improved glycemic control. She has hypertension and dyslipidemia. She has exogenous obesity history of allergic rhinitis and low back pain. Her back pain has been stable. No new concerns or complaints  Past Medical History  Diagnosis Date  . ALLERGIC RHINITIS 06/17/2010  . ANEMIA-NOS 06/17/2010  . BREAST CANCER, HX OF 06/17/2010  . COPD 06/17/2010  . HYPERLIPIDEMIA 06/17/2010  . LOW BACK PAIN 06/17/2010  . OSTEOARTHRITIS 06/17/2010  . OSTEOPENIA 06/17/2010  . Leg swelling   . Weakness   . DIABETES MELLITUS, TYPE II 06/17/2010  . HYPERTENSION 06/17/2010  . Cancer 2008    right breast  . Fibroid     FIBROID  . Endometriosis   . Cataract     History   Social History  . Marital Status: Married    Spouse Name: N/A    Number of Children: N/A  . Years of Education: N/A   Occupational History  . Not on file.   Social History Main Topics  . Smoking status: Never Smoker   . Smokeless tobacco: Never Used  . Alcohol Use: No  . Drug Use: No  . Sexual Activity: No   Other Topics Concern  . Not on file   Social History Narrative  . No narrative on file    Past Surgical History  Procedure Laterality Date  . Abdominal hysterectomy  1988    TAH,LSO  . Oophorectomy  1988    TAH,LSO  . Tubal ligation    . Breast lumpectomy  2007    LUMPECTOMY FOLLOWED BY RADIATION    Family History  Problem Relation Age of Onset  . Diabetes Mother   . Hypertension Sister   . Asthma Father   . Diabetes Brother     No Known Allergies  Current Outpatient Prescriptions on File Prior to Visit  Medication Sig Dispense Refill  . amLODipine-benazepril (LOTREL) 5-40 MG per capsule TAKE ONE CAPSULE BY MOUTH EVERY DAY  30 capsule  5  . benzonatate (TESSALON) 200 MG capsule Take 1  capsule (200 mg total) by mouth 2 (two) times daily as needed for cough.  30 capsule  2  . Blood Glucose Monitoring Suppl (ACCU-CHEK COMPACT CARE KIT) KIT USE AS DIRECTED  1 each  0  . Cholecalciferol (VITAMIN D) 2000 UNITS CAPS Take 1 capsule by mouth daily. Taking 2 caps daily 4,000      . cyanocobalamin 2000 MCG tablet Take 2,000 mcg by mouth daily.        . cyclobenzaprine (FLEXERIL) 10 MG tablet Take 1 tablet (10 mg total) by mouth 3 (three) times daily as needed for muscle spasms.  30 tablet  4  . FERREX 150 150 MG capsule TAKE ONE CAPSULE BY MOUTH EVERY DAY  30 capsule  2  . fexofenadine (ALLEGRA) 180 MG tablet Take 180 mg by mouth daily as needed.       . furosemide (LASIX) 20 MG tablet Take 1 tablet (20 mg total) by mouth daily as needed.  30 tablet  0  . glipiZIDE (GLUCOTROL XL) 5 MG 24 hr tablet Take 1 tablet (5 mg total) by mouth daily.  90 tablet  5  . glucose blood (ACCU-CHEK COMPACT PLUS) test strip 1 each by Other route 2 (two) times  daily as needed for other. Use as instructed  204 each  3  . hydrochlorothiazide (HYDRODIURIL) 25 MG tablet TAKE 1 TABLET BY MOUTH EVERY DAY  90 tablet  4  . HYDROcodone-acetaminophen (NORCO/VICODIN) 5-325 MG per tablet Take 1 tablet by mouth every 6 (six) hours as needed for pain.      . Lancets 30G MISC USE TWICE DAILY AS DIRECTED FOR GLUCOSE TESTING AND MONITORING  200 each  4  . Magnesium 250 MG TABS Take 1 tablet by mouth as needed.       . metFORMIN (GLUCOPHAGE) 1000 MG tablet Take 500 mg by mouth daily with breakfast. 500 mg at breakfast and 1000 mg at dinner      . metoprolol succinate (TOPROL-XL) 50 MG 24 hr tablet TAKE 1 TABLET BY MOUTH IN THE MORNING  90 tablet  1  . Multiple Vitamin (MULTIVITAMIN) tablet Take 1 tablet by mouth daily.        . naproxen (NAPROSYN) 375 MG tablet Take 1 tablet (375 mg total) by mouth 2 (two) times daily.  14 tablet  0  . pioglitazone (ACTOS) 45 MG tablet TAKE 1 TABLET BY MOUTH EVERY DAY  90 tablet  1  .  pravastatin (PRAVACHOL) 40 MG tablet Take 1 tablet (40 mg total) by mouth daily.  90 tablet  3  . sitaGLIPtin (JANUVIA) 100 MG tablet Take 1 tablet (100 mg total) by mouth daily.  90 tablet  6   No current facility-administered medications on file prior to visit.    BP 130/80  Pulse 82  Temp(Src) 97.4 F (36.3 C) (Oral)  Resp 20  Wt 250 lb (113.399 kg)  SpO2 97%       Review of Systems  Constitutional: Negative.   HENT: Negative for congestion, dental problem, hearing loss, rhinorrhea, sinus pressure, sore throat and tinnitus.   Eyes: Negative for pain, discharge and visual disturbance.  Respiratory: Negative for cough and shortness of breath.   Cardiovascular: Negative for chest pain, palpitations and leg swelling.  Gastrointestinal: Negative for nausea, vomiting, abdominal pain, diarrhea, constipation, blood in stool and abdominal distention.  Genitourinary: Negative for dysuria, urgency, frequency, hematuria, flank pain, vaginal bleeding, vaginal discharge, difficulty urinating, vaginal pain and pelvic pain.  Musculoskeletal: Positive for back pain. Negative for arthralgias, gait problem and joint swelling.  Skin: Negative for rash.  Neurological: Negative for dizziness, syncope, speech difficulty, weakness, numbness and headaches.  Hematological: Negative for adenopathy.  Psychiatric/Behavioral: Negative for behavioral problems, dysphoric mood and agitation. The patient is not nervous/anxious.        Objective:   Physical Exam  Constitutional: She is oriented to person, place, and time. She appears well-developed and well-nourished.  HENT:  Head: Normocephalic.  Right Ear: External ear normal.  Left Ear: External ear normal.  Mouth/Throat: Oropharynx is clear and moist.  Eyes: Conjunctivae and EOM are normal. Pupils are equal, round, and reactive to light.  Neck: Normal range of motion. Neck supple. No thyromegaly present.  Cardiovascular: Normal rate, regular rhythm  and normal heart sounds.   Faint pedal pulses  Pulmonary/Chest: Effort normal and breath sounds normal.  Abdominal: Soft. Bowel sounds are normal. She exhibits no mass. There is no tenderness.  Musculoskeletal: Normal range of motion.  Lymphadenopathy:    She has no cervical adenopathy.  Neurological: She is alert and oriented to person, place, and time.  Skin: Skin is warm and dry. No rash noted.  Psychiatric: She has a normal mood and affect. Her behavior  is normal.          Assessment & Plan:   Diabetes mellitus. Appears to be well-controlled we'll check a hemoglobin A1c and urine for microalbumin Hypertension stable  Weight loss encouraged recheck 3 months

## 2013-07-07 NOTE — Patient Instructions (Signed)
Limit your sodium (Salt) intake   Please check your hemoglobin A1c every 3 months  You need to lose weight.  Consider a lower calorie diet and regular exercise.    It is important that you exercise regularly, at least 20 minutes 3 to 4 times per week.  If you develop chest pain or shortness of breath seek  medical attention.  Please check your blood pressure on a regular basis.  If it is consistently greater than 150/90, please make an office appointment.

## 2013-07-08 ENCOUNTER — Telehealth: Payer: Self-pay

## 2013-07-08 NOTE — Telephone Encounter (Signed)
Relevant patient education mailed to patient.  

## 2013-07-13 ENCOUNTER — Telehealth: Payer: Self-pay | Admitting: Internal Medicine

## 2013-07-13 NOTE — Telephone Encounter (Signed)
Patient Information:  Caller Name: Doris Jackson  Phone: [phone]  Patient: Doris Jackson, Doris Jackson  Gender: Female  DOB: 1940-07-31  Age: 73 Years  PCP: Bluford Kaufmann [facility])  Office Follow Up:  Does the office need to follow up with this patient?: No  Instructions For The Office: N/A   Symptoms  Reason For Call & Symptoms: Started with Neck pain > R side onset 07/09/13. She has hx of Arthritis and has been seen in ER in the past for the same pain. She has vein on R side that is tender. Last night unable to sleep until took Hydrocodone. Requesting appointment.  Reviewed Health History In EMR: Yes  Reviewed Medications In EMR: Yes  Reviewed Allergies In EMR: Yes  Reviewed Surgeries / Procedures: Yes  Date of Onset of Symptoms: 07/09/2013  Treatments Tried: Muscle rub, Hydrocodone BID, Cyclobenzabren  Treatments Tried Worked: No  Guideline(s) Used:  Neck Pain or Stiffness  Disposition Per Guideline:   See Within 3 Days in Office  Reason For Disposition Reached:   Moderate neck pain (e.g., interferes with normal activities like work or school)  Advice Given:  Apply Cold to the Area Or Heat:   During the first 2 days after a mild injury, apply a cold pack or an ice bag (wrapped in a towel) for 20 minutes four times a day. After 2 days, apply a heating pad or hot water bottle to the most painful area for 20 minutes whenever the pain flares up. Wrap hot water bottles or heating pads in a towel to avoid burns.  Sleep:  Sleep on your back or side, not on your abdomen.  Sleep with a neck collar. Use a foam neck collar (from a pharmacy) OR a small towel wrapped around the neck (Reason: keep the head from moving too much during sleep).  Stretching Exercises:  After 48 hours of protecting the neck, begin gentle stretching exercises.  Improve the tone of the neck muscles with 2 or 3 minutes of gentle stretching exercises per day such as touching the chin to each shoulder, touching the ear  to each shoulder, and moving the head forward and backward.  Pain Medicines:  For pain relief, you can take either acetaminophen, ibuprofen, or naproxen.  They are over-the-counter (OTC) pain drugs. You can buy them at the drugstore.  Good Body Mechanics:  Lifting: Stand close to the object to be lifted. Keep your back straight and lift by bending your legs. Ask for help if needed.  Call Back If:  Numbness or weakness occurs  Bowel or bladder problems occur  Pain lasts for more than 2 weeks  You become worse.  Patient Will Follow Care Advice:  YES  Appointment Scheduled:  07/14/2013 10:45:00 Appointment Scheduled Provider:  Bluford Kaufmann (Family Practice > 30yrs old)

## 2013-07-14 ENCOUNTER — Encounter: Payer: Self-pay | Admitting: Internal Medicine

## 2013-07-14 ENCOUNTER — Ambulatory Visit (INDEPENDENT_AMBULATORY_CARE_PROVIDER_SITE_OTHER): Payer: Medicare Other | Admitting: Internal Medicine

## 2013-07-14 VITALS — BP 120/72 | HR 104 | Temp 97.6°F | Resp 20 | Ht 62.0 in | Wt 248.0 lb

## 2013-07-14 DIAGNOSIS — E119 Type 2 diabetes mellitus without complications: Secondary | ICD-10-CM

## 2013-07-14 DIAGNOSIS — M542 Cervicalgia: Secondary | ICD-10-CM

## 2013-07-14 MED ORDER — HYDROCODONE-ACETAMINOPHEN 5-325 MG PO TABS: 1.0000 | ORAL_TABLET | Freq: Four times a day (QID) | ORAL | Status: DC | PRN

## 2013-07-14 MED ORDER — NAPROXEN 375 MG PO TABS: 375.0000 mg | ORAL_TABLET | Freq: Two times a day (BID) | ORAL | Status: DC

## 2013-07-14 MED ORDER — CYCLOBENZAPRINE HCL 10 MG PO TABS: 10.0000 mg | ORAL_TABLET | Freq: Three times a day (TID) | ORAL | Status: DC | PRN

## 2013-07-14 NOTE — Progress Notes (Signed)
Subjective:    Patient ID: Doris Jackson, female    DOB: 07-10-1940, 73 y.o.   MRN: [id number]  HPI   73 year old patient who presents with a five-day history of neck pain. She awoke with pain Saturday morning that is localized primarily to the right posterior region. Pain is aggravated by head movement especially head turning to the right. She has been using Flexeril analgesics with some benefit. Pain is worse in the morning and improves throughout the day. No radicular symptoms  Past Medical History  Diagnosis Date  . ALLERGIC RHINITIS 06/17/2010  . ANEMIA-NOS 06/17/2010  . BREAST CANCER, HX OF 06/17/2010  . COPD 06/17/2010  . HYPERLIPIDEMIA 06/17/2010  . LOW BACK PAIN 06/17/2010  . OSTEOARTHRITIS 06/17/2010  . OSTEOPENIA 06/17/2010  . Leg swelling   . Weakness   . DIABETES MELLITUS, TYPE II 06/17/2010  . HYPERTENSION 06/17/2010  . Cancer 2008    right breast  . Fibroid     FIBROID  . Endometriosis   . Cataract     History   Social History  . Marital Status: Married    Spouse Name: N/A    Number of Children: N/A  . Years of Education: N/A   Occupational History  . Not on file.   Social History Main Topics  . Smoking status: Never Smoker   . Smokeless tobacco: Never Used  . Alcohol Use: No  . Drug Use: No  . Sexual Activity: No   Other Topics Concern  . Not on file   Social History Narrative  . No narrative on file    Past Surgical History  Procedure Laterality Date  . Abdominal hysterectomy  1988    TAH,LSO  . Oophorectomy  1988    TAH,LSO  . Tubal ligation    . Breast lumpectomy  2007    LUMPECTOMY FOLLOWED BY RADIATION    Family History  Problem Relation Age of Onset  . Diabetes Mother   . Hypertension Sister   . Asthma Father   . Diabetes Brother     No Known Allergies  Current Outpatient Prescriptions on File Prior to Visit  Medication Sig Dispense Refill  . amLODipine-benazepril (LOTREL) 5-40 MG per capsule TAKE ONE CAPSULE BY  MOUTH EVERY DAY  30 capsule  5  . benzonatate (TESSALON) 200 MG capsule Take 1 capsule (200 mg total) by mouth 2 (two) times daily as needed for cough.  30 capsule  2  . Blood Glucose Monitoring Suppl (ACCU-CHEK COMPACT CARE KIT) KIT USE AS DIRECTED  1 each  0  . Cholecalciferol (VITAMIN D) 2000 UNITS CAPS Take 1 capsule by mouth daily. Taking 2 caps daily 4,000      . cyanocobalamin 2000 MCG tablet Take 2,000 mcg by mouth daily.        . cyclobenzaprine (FLEXERIL) 10 MG tablet Take 1 tablet (10 mg total) by mouth 3 (three) times daily as needed for muscle spasms.  30 tablet  4  . FERREX 150 150 MG capsule TAKE ONE CAPSULE BY MOUTH EVERY DAY  30 capsule  2  . fexofenadine (ALLEGRA) 180 MG tablet Take 180 mg by mouth daily as needed.       . furosemide (LASIX) 20 MG tablet Take 1 tablet (20 mg total) by mouth daily as needed.  30 tablet  0  . glipiZIDE (GLUCOTROL XL) 5 MG 24 hr tablet Take 1 tablet (5 mg total) by mouth daily.  90 tablet  5  . glucose blood (  ACCU-CHEK COMPACT PLUS) test strip 1 each by Other route 2 (two) times daily as needed for other. Use as instructed  204 each  3  . hydrochlorothiazide (HYDRODIURIL) 25 MG tablet TAKE 1 TABLET BY MOUTH EVERY DAY  90 tablet  4  . HYDROcodone-acetaminophen (NORCO/VICODIN) 5-325 MG per tablet Take 1 tablet by mouth every 6 (six) hours as needed for pain.      . Lancets 30G MISC USE TWICE DAILY AS DIRECTED FOR GLUCOSE TESTING AND MONITORING  200 each  4  . Magnesium 250 MG TABS Take 1 tablet by mouth as needed.       . metFORMIN (GLUCOPHAGE) 1000 MG tablet Take 500 mg by mouth daily with breakfast. 500 mg at breakfast and 1000 mg at dinner      . metoprolol succinate (TOPROL-XL) 50 MG 24 hr tablet TAKE 1 TABLET BY MOUTH IN THE MORNING  90 tablet  1  . metroNIDAZOLE (FLAGYL) 500 MG tablet Take 1 tablet (500 mg total) by mouth 3 (three) times daily.  21 tablet  0  . Multiple Vitamin (MULTIVITAMIN) tablet Take 1 tablet by mouth daily.        .  naproxen (NAPROSYN) 375 MG tablet Take 1 tablet (375 mg total) by mouth 2 (two) times daily.  14 tablet  0  . pioglitazone (ACTOS) 45 MG tablet TAKE 1 TABLET BY MOUTH EVERY DAY  90 tablet  1  . pravastatin (PRAVACHOL) 40 MG tablet Take 1 tablet (40 mg total) by mouth daily.  90 tablet  3  . sitaGLIPtin (JANUVIA) 100 MG tablet Take 1 tablet (100 mg total) by mouth daily.  90 tablet  6   No current facility-administered medications on file prior to visit.    BP 120/72  Pulse 104  Temp(Src) 97.6 F (36.4 C) (Oral)  Resp 20  Ht _0  (1.575 m)  Wt 248 lb (112.492 kg)  BMI 45.35 kg/m2  SpO2 98%       Review of Systems  Musculoskeletal:       Right posterior neck pain       Objective:   Physical Exam  Constitutional: She appears well-developed and well-nourished. No distress.  Musculoskeletal:  The posterior neck musculature tighten slightly tender Range of motion of the neck fairly intact. Pain aggravated with head turning to the left and especially the right Right trapezius musculature also tender          Assessment & Plan:   Neck pain. Appears to be musculoligamentous without radiculopathy. We'll continue analgesics and anti-inflammatories. We'll consider heat therapy and gentle massage as well as gentle stretching exercises

## 2013-07-14 NOTE — Progress Notes (Signed)
Pre-visit discussion using our clinic review tool. No additional management support is needed unless otherwise documented below in the visit note.  

## 2013-07-14 NOTE — Patient Instructions (Addendum)
You  may move around, but avoid painful motions and activities.  Apply ice to the sore area for 15 to 20 minutes 3 or 4 times daily for the next two to 3 days.   Cervical Sprain A cervical sprain is an injury in the neck in which the strong, fibrous tissues (ligaments) that connect your neck bones stretch or tear. Cervical sprains can range from mild to severe. Severe cervical sprains can cause the neck vertebrae to be unstable. This can lead to damage of the spinal cord and can result in serious nervous system problems. The amount of time it takes for a cervical sprain to get better depends on the cause and extent of the injury. Most cervical sprains heal in 1 to 3 weeks. CAUSES  Severe cervical sprains may be caused by:   Contact sport injuries (such as from football, rugby, wrestling, hockey, auto racing, gymnastics, diving, martial arts, or boxing).   Motor vehicle collisions.   Whiplash injuries. This is an injury from a sudden forward-and backward whipping movement of the head and neck.  Falls.  Mild cervical sprains may be caused by:   Being in an awkward position, such as while cradling a telephone between your ear and shoulder.   Sitting in a chair that does not offer proper support.   Working at a poorly Landscape architect station.   Looking up or down for long periods of time.  SYMPTOMS   Pain, soreness, stiffness, or a burning sensation in the front, back, or sides of the neck. This discomfort may develop immediately after the injury or slowly, 24 hours or more after the injury.   Pain or tenderness directly in the middle of the back of the neck.   Shoulder or upper back pain.   Limited ability to move the neck.   Headache.   Dizziness.   Weakness, numbness, or tingling in the hands or arms.   Muscle spasms.   Difficulty swallowing or chewing.   Tenderness and swelling of the neck.  DIAGNOSIS  Most of the time your health care provider can  diagnose a cervical sprain by taking your history and doing a physical exam. Your health care provider will ask about previous neck injuries and any known neck problems, such as arthritis in the neck. X-rays may be taken to find out if there are any other problems, such as with the bones of the neck. Other tests, such as a CT scan or MRI, may also be needed.  TREATMENT  Treatment depends on the severity of the cervical sprain. Mild sprains can be treated with rest, keeping the neck in place (immobilization), and pain medicines. Severe cervical sprains are immediately immobilized. Further treatment is done to help with pain, muscle spasms, and other symptoms and may include:  Medicines, such as pain relievers, numbing medicines, or muscle relaxants.   Physical therapy. This may involve stretching exercises, strengthening exercises, and posture training. Exercises and improved posture can help stabilize the neck, strengthen muscles, and help stop symptoms from returning.  HOME CARE INSTRUCTIONS   Put ice on the injured area.   Put ice in a plastic bag.   Place a towel between your skin and the bag.   Leave the ice on for 15 20 minutes, 3 4 times a day.   If your injury was severe, you may have been given a cervical collar to wear. A cervical collar is a two-piece collar designed to keep your neck from moving while it heals.  Do not remove the collar unless instructed by your health care provider.  If you have long hair, keep it outside of the collar.  Ask your health care provider before making any adjustments to your collar. Minor adjustments may be required over time to improve comfort and reduce pressure on your chin or on the back of your head.  Ifyou are allowed to remove the collar for cleaning or bathing, follow your health care provider's instructions on how to do so safely.  Keep your collar clean by wiping it with mild soap and water and drying it completely. If the collar  you have been given includes removable pads, remove them every 1 2 days and hand wash them with soap and water. Allow them to air dry. They should be completely dry before you wear them in the collar.  If you are allowed to remove the collar for cleaning and bathing, wash and dry the skin of your neck. Check your skin for irritation or sores. If you see any, tell your health care provider.  Do not drive while wearing the collar.   Only take over-the-counter or prescription medicines for pain, discomfort, or fever as directed by your health care provider.   Keep all follow-up appointments as directed by your health care provider.   Keep all physical therapy appointments as directed by your health care provider.   Make any needed adjustments to your workstation to promote good posture.   Avoid positions and activities that make your symptoms worse.   Warm up and stretch before being active to help prevent problems.  SEEK MEDICAL CARE IF:   Your pain is not controlled with medicine.   You are unable to decrease your pain medicine over time as planned.   Your activity level is not improving as expected.  SEEK IMMEDIATE MEDICAL CARE IF:   You develop any bleeding.  You develop stomach upset.  You have signs of an allergic reaction to your medicine.   Your symptoms get worse.   You develop new, unexplained symptoms.   You have numbness, tingling, weakness, or paralysis in any part of your body.  MAKE SURE YOU:   Understand these instructions.  Will watch your condition.  Will get help right away if you are not doing well or get worse. Document Released: 04/13/2007 Document Revised: 04/06/2013 Document Reviewed: 12/22/2012 [facility] Patient Information 2014 [employer].

## 2013-08-01 ENCOUNTER — Telehealth: Payer: Self-pay | Admitting: Internal Medicine

## 2013-08-01 NOTE — Telephone Encounter (Signed)
Relevant patient education mailed to patient.  

## 2013-09-13 ENCOUNTER — Telehealth: Payer: Self-pay | Admitting: Internal Medicine

## 2013-09-13 ENCOUNTER — Other Ambulatory Visit: Payer: Self-pay | Admitting: Internal Medicine

## 2013-09-13 MED ORDER — HYDROCODONE-ACETAMINOPHEN 5-325 MG PO TABS: 1.0000 | ORAL_TABLET | Freq: Four times a day (QID) | ORAL | Status: DC | PRN

## 2013-09-13 NOTE — Telephone Encounter (Signed)
Pt request rx HYDROcodone-acetaminophen (NORCO/VICODIN) 5-325 MG per tablet ° °

## 2013-09-14 NOTE — Telephone Encounter (Signed)
Left detailed message Rx ready for pickup, will be at the front desk. Rx printed and signed. 

## 2013-09-15 ENCOUNTER — Ambulatory Visit (INDEPENDENT_AMBULATORY_CARE_PROVIDER_SITE_OTHER): Payer: Medicare Other | Admitting: Family Medicine

## 2013-09-15 ENCOUNTER — Encounter: Payer: Self-pay | Admitting: Family Medicine

## 2013-09-15 ENCOUNTER — Ambulatory Visit (INDEPENDENT_AMBULATORY_CARE_PROVIDER_SITE_OTHER)
Admission: RE | Admit: 2013-09-15 | Discharge: 2013-09-15 | Disposition: A | Discharge status: Home / Self Care | Payer: Medicare Other | Source: Ambulatory Visit | Attending: Family Medicine | Admitting: Family Medicine

## 2013-09-15 VITALS — BP 130/70 | Temp 98.1°F

## 2013-09-15 DIAGNOSIS — S93409A Sprain of unspecified ligament of unspecified ankle, initial encounter: Secondary | ICD-10-CM

## 2013-09-15 DIAGNOSIS — S93402A Sprain of unspecified ligament of left ankle, initial encounter: Secondary | ICD-10-CM

## 2013-09-15 NOTE — Progress Notes (Signed)
Chief Complaint  Patient presents with  . Ankle Injury    HPI:  Acute visit for ankle pain: -reports: slid on socks on floor yesterday and rolled L ankle and tipped over, was able to bear a little weight on it, feels better today, but feels like it is a little swollen in lateral ankle -denies: fevers, chills, neck or head pain, LOC, pain elsewhere -on [facility] pcp refilled norco 09/13/13  ROS: See pertinent positives and negatives per HPI.  Past Medical History  Diagnosis Date  . ALLERGIC RHINITIS 06/17/2010  . ANEMIA-NOS 06/17/2010  . BREAST CANCER, HX OF 06/17/2010  . COPD 06/17/2010  . HYPERLIPIDEMIA 06/17/2010  . LOW BACK PAIN 06/17/2010  . OSTEOARTHRITIS 06/17/2010  . OSTEOPENIA 06/17/2010  . Leg swelling   . Weakness   . DIABETES MELLITUS, TYPE II 06/17/2010  . HYPERTENSION 06/17/2010  . Cancer 2008    right breast  . Fibroid     FIBROID  . Endometriosis   . Cataract     Past Surgical History  Procedure Laterality Date  . Abdominal hysterectomy  1988    TAH,LSO  . Oophorectomy  1988    TAH,LSO  . Tubal ligation    . Breast lumpectomy  2007    LUMPECTOMY FOLLOWED BY RADIATION    Family History  Problem Relation Age of Onset  . Diabetes Mother   . Hypertension Sister   . Asthma Father   . Diabetes Brother     History   Social History  . Marital Status: Married    Spouse Name: N/A    Number of Children: N/A  . Years of Education: N/A   Social History Main Topics  . Smoking status: Never Smoker   . Smokeless tobacco: Never Used  . Alcohol Use: No  . Drug Use: No  . Sexual Activity: No   Other Topics Concern  . None   Social History Narrative  . None    Current outpatient prescriptions:amLODipine-benazepril (LOTREL) 5-40 MG per capsule, TAKE ONE CAPSULE BY MOUTH EVERY DAY, Disp: 30 capsule, Rfl: 5;  benzonatate (TESSALON) 200 MG capsule, Take 1 capsule (200 mg total) by mouth 2 (two) times daily as needed for cough., Disp: 30 capsule, Rfl: 2;   Blood Glucose Monitoring Suppl (ACCU-CHEK COMPACT CARE KIT) KIT, USE AS DIRECTED, Disp: 1 each, Rfl: 0 Cholecalciferol (VITAMIN D) 2000 UNITS CAPS, Take 1 capsule by mouth daily. Taking 2 caps daily 4,000, Disp: , Rfl: ;  cyanocobalamin 2000 MCG tablet, Take 2,000 mcg by mouth daily.  , Disp: , Rfl: ;  cyclobenzaprine (FLEXERIL) 10 MG tablet, Take 1 tablet (10 mg total) by mouth 3 (three) times daily as needed for muscle spasms., Disp: 30 tablet, Rfl: 4;  FERREX 150 150 MG capsule, TAKE ONE CAPSULE BY MOUTH EVERY DAY, Disp: 30 capsule, Rfl: 2 fexofenadine (ALLEGRA) 180 MG tablet, Take 180 mg by mouth daily as needed. , Disp: , Rfl: ;  furosemide (LASIX) 20 MG tablet, Take 1 tablet (20 mg total) by mouth daily as needed., Disp: 30 tablet, Rfl: 0;  glipiZIDE (GLUCOTROL XL) 5 MG 24 hr tablet, Take 1 tablet (5 mg total) by mouth daily., Disp: 90 tablet, Rfl: 5 glucose blood (ACCU-CHEK COMPACT PLUS) test strip, 1 each by Other route 2 (two) times daily as needed for other. Use as instructed, Disp: 204 each, Rfl: 3;  hydrochlorothiazide (HYDRODIURIL) 25 MG tablet, TAKE 1 TABLET BY MOUTH EVERY DAY, Disp: 90 tablet, Rfl: 4;  HYDROcodone-acetaminophen (NORCO/VICODIN) 5-325 MG  per tablet, Take 1 tablet by mouth every 6 (six) hours as needed., Disp: 60 tablet, Rfl: 0 Lancets 30G MISC, USE TWICE DAILY AS DIRECTED FOR GLUCOSE TESTING AND MONITORING, Disp: 200 each, Rfl: 4;  Magnesium 250 MG TABS, Take 1 tablet by mouth as needed. , Disp: , Rfl: ;  metFORMIN (GLUCOPHAGE) 1000 MG tablet, Take 500 mg by mouth daily with breakfast. 500 mg at breakfast and 1000 mg at dinner, Disp: , Rfl: ;  metoprolol succinate (TOPROL-XL) 50 MG 24 hr tablet, TAKE 1 TABLET BY MOUTH IN THE MORNING, Disp: 90 tablet, Rfl: 1 metroNIDAZOLE (FLAGYL) 500 MG tablet, Take 1 tablet (500 mg total) by mouth 3 (three) times daily., Disp: 21 tablet, Rfl: 0;  Multiple Vitamin (MULTIVITAMIN) tablet, Take 1 tablet by mouth daily.  , Disp: , Rfl: ;  naproxen  (NAPROSYN) 375 MG tablet, Take 1 tablet (375 mg total) by mouth 2 (two) times daily., Disp: 60 tablet, Rfl: 5;  pioglitazone (ACTOS) 45 MG tablet, TAKE 1 TABLET BY MOUTH EVERY DAY, Disp: 90 tablet, Rfl: 1 pravastatin (PRAVACHOL) 40 MG tablet, Take 1 tablet (40 mg total) by mouth daily., Disp: 90 tablet, Rfl: 3;  sitaGLIPtin (JANUVIA) 100 MG tablet, Take 1 tablet (100 mg total) by mouth daily., Disp: 90 tablet, Rfl: 6  EXAM:  Filed Vitals:   09/15/13 1416  BP: 130/70  Temp: 98.1 F (36.7 C)    Body mass index is 0.00 kg/(m^2).  GENERAL: vitals reviewed and listed above, alert, oriented, appears well hydrated and in no acute distress  HEENT: atraumatic, conjunttiva clear, no obvious abnormalities on inspection of external nose and ears  NECK: no obvious masses on inspection  MS: moves all extremities without noticeable abnormality -bilat LE edema with dorsolat ankle TTP, diffuse TTP - no point bony TTP, antalgic gait, NV intack distally  PSYCH: pleasant and cooperative, no obvious depression or anxiety  ASSESSMENT AND PLAN:  Discussed the following assessment and plan:  Left ankle sprain - Plan: DG Ankle Complete Left  -advised RICE, plain films, rx for walker and compression stockings provided -follow up with PCP in 2 weeks -Patient advised to return or notify a doctor immediately if symptoms worsen or persist or new concerns arise.  There are no Patient Instructions on file for this visit.   Colin Benton R.

## 2013-09-15 NOTE — Progress Notes (Signed)
Pre visit review using our clinic review tool, if applicable. No additional management support is needed unless otherwise documented below in the visit note. 

## 2013-09-25 ENCOUNTER — Other Ambulatory Visit: Payer: Self-pay | Admitting: Internal Medicine

## 2013-10-17 ENCOUNTER — Telehealth: Payer: Self-pay | Admitting: Internal Medicine

## 2013-10-17 MED ORDER — HYDROCODONE-ACETAMINOPHEN 5-325 MG PO TABS: 1.0000 | ORAL_TABLET | Freq: Four times a day (QID) | ORAL | Status: DC | PRN

## 2013-10-17 NOTE — Telephone Encounter (Signed)
Pt  Request refill HYDROcodone-acetaminophen (NORCO/VICODIN) 5-325 MG per tablet

## 2013-10-17 NOTE — Telephone Encounter (Signed)
Pt notified Rx ready for pickup. Rx printed and signed.  

## 2013-10-26 ENCOUNTER — Other Ambulatory Visit: Payer: Self-pay | Admitting: Internal Medicine

## 2013-11-21 ENCOUNTER — Other Ambulatory Visit: Payer: Self-pay | Admitting: Internal Medicine

## 2013-11-22 ENCOUNTER — Telehealth: Payer: Self-pay | Admitting: Internal Medicine

## 2013-11-22 NOTE — Telephone Encounter (Signed)
Pt needs new rx hydrocodone °

## 2013-11-23 MED ORDER — HYDROCODONE-ACETAMINOPHEN 5-325 MG PO TABS: 1.0000 | ORAL_TABLET | Freq: Four times a day (QID) | ORAL | Status: DC | PRN

## 2013-11-23 NOTE — Telephone Encounter (Signed)
Pt notified Rx ready for pickup. Rx printed and signed.  

## 2013-12-15 ENCOUNTER — Telehealth: Payer: Self-pay | Admitting: Internal Medicine

## 2013-12-15 MED ORDER — SITAGLIPTIN PHOSPHATE 100 MG PO TABS: 100.0000 mg | ORAL_TABLET | Freq: Every day | ORAL | Status: DC

## 2013-12-15 NOTE — Telephone Encounter (Signed)
Pt req rx on sitaGLIPtin (JANUVIA) 100 MG tablet     Pharmacy  [facility] on [address]

## 2013-12-16 NOTE — Telephone Encounter (Signed)
Rx sent to pharmacy   

## 2013-12-21 ENCOUNTER — Other Ambulatory Visit: Payer: Self-pay | Admitting: Internal Medicine

## 2013-12-21 ENCOUNTER — Telehealth: Payer: Self-pay | Admitting: Internal Medicine

## 2013-12-21 NOTE — Telephone Encounter (Signed)
Have patient discontinue Januvia at her present supply.  We'll check hemoglobin A1c at next visit in determine if she needs additional medication as a replacement

## 2013-12-21 NOTE — Telephone Encounter (Signed)
Dr. Raliegh Ip, pt can not afford Januvia it was over $200. Please advise what other medication you would like to prescribe. Doris Jackson is now the lowest co-pay for medicare patients just FYI.

## 2013-12-21 NOTE — Telephone Encounter (Signed)
Pt states her sitaGLIPtin (JANUVIA) 100 MG tablet Was over $200.  The dr has always given her samples. I was able to download a discount card on the internet for pt.  Do we have those cards here in the office as well?  Is it ok to give card to pt?  This discount card is good for a year. pls advise thanks

## 2013-12-21 NOTE — Telephone Encounter (Signed)
Spoke to pt told her to discontinue Januvia at her present supply. We'll check hemoglobin A1c at next visit in determine if she needs additional medication as a replacement per Dr. Raliegh Ip. Pt verbalized understanding.

## 2013-12-26 ENCOUNTER — Telehealth: Payer: Self-pay | Admitting: Internal Medicine

## 2013-12-26 NOTE — Telephone Encounter (Signed)
Pt is needing new rx for hydrocodone, please call when available for pick nup.

## 2013-12-27 MED ORDER — HYDROCODONE-ACETAMINOPHEN 5-325 MG PO TABS: 1.0000 | ORAL_TABLET | Freq: Four times a day (QID) | ORAL | Status: DC | PRN

## 2013-12-27 NOTE — Telephone Encounter (Signed)
Pt notified Rx ready for pickup. Rx printed and signed.  

## 2014-01-05 ENCOUNTER — Ambulatory Visit (INDEPENDENT_AMBULATORY_CARE_PROVIDER_SITE_OTHER): Payer: Medicare Other | Admitting: Internal Medicine

## 2014-01-05 ENCOUNTER — Encounter: Payer: Self-pay | Admitting: Internal Medicine

## 2014-01-05 VITALS — BP 120/80 | HR 102 | Temp 98.7°F | Resp 20 | Ht 62.0 in | Wt 247.0 lb

## 2014-01-05 DIAGNOSIS — M1990 Unspecified osteoarthritis, unspecified site: Secondary | ICD-10-CM

## 2014-01-05 DIAGNOSIS — M545 Low back pain, unspecified: Secondary | ICD-10-CM

## 2014-01-05 DIAGNOSIS — I10 Essential (primary) hypertension: Secondary | ICD-10-CM

## 2014-01-05 DIAGNOSIS — E119 Type 2 diabetes mellitus without complications: Secondary | ICD-10-CM

## 2014-01-05 LAB — HEMOGLOBIN A1C: Hgb A1c MFr Bld: 6.4 % (ref 4.6–6.5)

## 2014-01-05 NOTE — Progress Notes (Signed)
Subjective:    Patient ID: Doris Jackson, female    DOB: 06/16/1941, 73 y.o.   MRN: [id number]  HPI   73 year old patient who is seen today for followup of type 2 diabetes.  She has hypertension and dyslipidemia.  She has chronic low back pain.  In general doing fairly well Her last hemoglobin A1c was 6 point 6.  She will be unable to afford Januvia and has run out within the past week. No cardiopulmonary complaints.  Past Medical History  Diagnosis Date  . ALLERGIC RHINITIS 06/17/2010  . ANEMIA-NOS 06/17/2010  . BREAST CANCER, HX OF 06/17/2010  . COPD 06/17/2010  . HYPERLIPIDEMIA 06/17/2010  . LOW BACK PAIN 06/17/2010  . OSTEOARTHRITIS 06/17/2010  . OSTEOPENIA 06/17/2010  . Leg swelling   . Weakness   . DIABETES MELLITUS, TYPE II 06/17/2010  . HYPERTENSION 06/17/2010  . Cancer 2008    right breast  . Fibroid     FIBROID  . Endometriosis   . Cataract     History   Social History  . Marital Status: Married    Spouse Name: N/A    Number of Children: N/A  . Years of Education: N/A   Occupational History  . Not on file.   Social History Main Topics  . Smoking status: Never Smoker   . Smokeless tobacco: Never Used  . Alcohol Use: No  . Drug Use: No  . Sexual Activity: No   Other Topics Concern  . Not on file   Social History Narrative  . No narrative on file    Past Surgical History  Procedure Laterality Date  . Abdominal hysterectomy  1988    TAH,LSO  . Oophorectomy  1988    TAH,LSO  . Tubal ligation    . Breast lumpectomy  2007    LUMPECTOMY FOLLOWED BY RADIATION    Family History  Problem Relation Age of Onset  . Diabetes Mother   . Hypertension Sister   . Asthma Father   . Diabetes Brother     No Known Allergies  Current Outpatient Prescriptions on File Prior to Visit  Medication Sig Dispense Refill  . ACCU-CHEK COMPACT PLUS test strip TEST TWICE A DAY AS NEEDED  204 each  2  . amLODipine-benazepril (LOTREL) 5-40 MG per capsule TAKE ONE  CAPSULE BY MOUTH EVERY DAY  30 capsule  5  . Blood Glucose Monitoring Suppl (ACCU-CHEK COMPACT CARE KIT) KIT USE AS DIRECTED  1 each  0  . Cholecalciferol (VITAMIN D) 2000 UNITS CAPS Take 1 capsule by mouth daily. Taking 2 caps daily 4,000      . cyanocobalamin 2000 MCG tablet Take 2,000 mcg by mouth daily.        . cyclobenzaprine (FLEXERIL) 10 MG tablet Take 1 tablet (10 mg total) by mouth 3 (three) times daily as needed for muscle spasms.  30 tablet  4  . FERREX 150 150 MG capsule TAKE ONE CAPSULE BY MOUTH EVERY DAY  30 capsule  2  . fexofenadine (ALLEGRA) 180 MG tablet Take 180 mg by mouth daily as needed.       . furosemide (LASIX) 20 MG tablet Take 1 tablet (20 mg total) by mouth daily as needed.  30 tablet  0  . glipiZIDE (GLUCOTROL XL) 5 MG 24 hr tablet Take 1 tablet (5 mg total) by mouth daily.  90 tablet  5  . hydrochlorothiazide (HYDRODIURIL) 25 MG tablet TAKE 1 TABLET BY MOUTH EVERY DAY  90 tablet  4  . HYDROcodone-acetaminophen (NORCO/VICODIN) 5-325 MG per tablet Take 1 tablet by mouth every 6 (six) hours as needed.  60 tablet  0  . Lancets 30G MISC USE TWICE DAILY AS DIRECTED FOR GLUCOSE TESTING AND MONITORING  200 each  4  . Magnesium 250 MG TABS Take 1 tablet by mouth as needed.       . metFORMIN (GLUCOPHAGE) 1000 MG tablet Take 500 mg by mouth daily with breakfast. 500 mg at breakfast and 1000 mg at dinner      . metoprolol succinate (TOPROL-XL) 50 MG 24 hr tablet TAKE 1 TABLET BY MOUTH IN THE MORNING  90 tablet  1  . Multiple Vitamin (MULTIVITAMIN) tablet Take 1 tablet by mouth daily.        . naproxen (NAPROSYN) 375 MG tablet Take 1 tablet (375 mg total) by mouth 2 (two) times daily.  60 tablet  5  . pioglitazone (ACTOS) 45 MG tablet TAKE 1 TABLET BY MOUTH EVERY DAY  90 tablet  1  . pravastatin (PRAVACHOL) 40 MG tablet TAKE 1 TABLET BY MOUTH EVERY DAY  90 tablet  0  . benzonatate (TESSALON) 200 MG capsule Take 1 capsule (200 mg total) by mouth 2 (two) times daily as needed for  cough.  30 capsule  2   No current facility-administered medications on file prior to visit.    BP 120/80  Pulse 102  Temp(Src) 98.7 F (37.1 C) (Oral)  Resp 20  Ht _0  (1.575 m)  Wt 247 lb (112.038 kg)  BMI 45.17 kg/m2  SpO2 98%      Review of Systems  Constitutional: Negative.   HENT: Negative for congestion, dental problem, hearing loss, rhinorrhea, sinus pressure, sore throat and tinnitus.   Eyes: Negative for pain, discharge and visual disturbance.  Respiratory: Negative for cough and shortness of breath.   Cardiovascular: Negative for chest pain, palpitations and leg swelling.  Gastrointestinal: Negative for nausea, vomiting, abdominal pain, diarrhea, constipation, blood in stool and abdominal distention.  Genitourinary: Negative for dysuria, urgency, frequency, hematuria, flank pain, vaginal bleeding, vaginal discharge, difficulty urinating, vaginal pain and pelvic pain.  Musculoskeletal: Positive for back pain. Negative for arthralgias, gait problem and joint swelling.  Skin: Negative for rash.  Neurological: Negative for dizziness, syncope, speech difficulty, weakness, numbness and headaches.  Hematological: Negative for adenopathy.  Psychiatric/Behavioral: Negative for behavioral problems, dysphoric mood and agitation. The patient is not nervous/anxious.        Objective:   Physical Exam  Constitutional: She is oriented to person, place, and time. She appears well-developed and well-nourished.  HENT:  Head: Normocephalic.  Right Ear: External ear normal.  Left Ear: External ear normal.  Mouth/Throat: Oropharynx is clear and moist.  Eyes: Conjunctivae and EOM are normal. Pupils are equal, round, and reactive to light.  Neck: Normal range of motion. Neck supple. No thyromegaly present.  Cardiovascular: Normal rate, regular rhythm, normal heart sounds and intact distal pulses.   Pulmonary/Chest: Effort normal and breath sounds normal.  Abdominal: Soft. Bowel  sounds are normal. She exhibits no mass. There is no tenderness.  Musculoskeletal: Normal range of motion.  Lymphadenopathy:    She has no cervical adenopathy.  Neurological: She is alert and oriented to person, place, and time.  Skin: Skin is warm and dry. No rash noted.  Psychiatric: She has a normal mood and affect. Her behavior is normal.          Assessment & Plan:   Diabetes mellitus.  We'll check a hemoglobin A1c.  We'll recheck in 3 months.  We'll let this continue Januvia, due to cost concerns.  May need to consider alternative therapy in 3 months Hypertension well controlled Chronic low back pain Dyslipidemia

## 2014-01-05 NOTE — Patient Instructions (Signed)
Limit your sodium (Salt) intake    It is important that you exercise regularly, at least 20 minutes 3 to 4 times per week.  If you develop chest pain or shortness of breath seek  medical attention.  You need to lose weight.  Consider a lower calorie diet and regular exercise.   Please check your hemoglobin A1c every 3 months   

## 2014-01-05 NOTE — Progress Notes (Signed)
Pre visit review using our clinic review tool, if applicable. No additional management support is needed unless otherwise documented below in the visit note. 

## 2014-01-19 ENCOUNTER — Telehealth: Payer: Self-pay | Admitting: Internal Medicine

## 2014-01-19 MED ORDER — LANCETS 30G MISC: Status: DC

## 2014-01-19 NOTE — Telephone Encounter (Signed)
rx sent in electronically 

## 2014-01-19 NOTE — Telephone Encounter (Signed)
Pt needs new rx accu-chek compact lancets # 60 sent to [facility] [address]

## 2014-01-27 ENCOUNTER — Telehealth: Payer: Self-pay | Admitting: Internal Medicine

## 2014-01-27 MED ORDER — HYDROCODONE-ACETAMINOPHEN 5-325 MG PO TABS: 1.0000 | ORAL_TABLET | Freq: Four times a day (QID) | ORAL | Status: DC | PRN

## 2014-01-27 NOTE — Telephone Encounter (Signed)
Pt is needing new rx HYDROcodone-acetaminophen (NORCO/VICODIN) 5-325 MG per tablet please call when available for pick up.

## 2014-01-27 NOTE — Telephone Encounter (Signed)
Pt notified Rx ready for pickup. Rx printed and signed by Dr. Burchette.  

## 2014-02-02 ENCOUNTER — Ambulatory Visit (INDEPENDENT_AMBULATORY_CARE_PROVIDER_SITE_OTHER): Payer: Medicare Other | Admitting: Internal Medicine

## 2014-02-02 ENCOUNTER — Encounter: Payer: Self-pay | Admitting: Internal Medicine

## 2014-02-02 VITALS — BP 130/84 | HR 87 | Temp 97.8°F | Resp 20 | Ht 62.0 in | Wt 242.0 lb

## 2014-02-02 DIAGNOSIS — I10 Essential (primary) hypertension: Secondary | ICD-10-CM

## 2014-02-02 DIAGNOSIS — E119 Type 2 diabetes mellitus without complications: Secondary | ICD-10-CM

## 2014-02-02 NOTE — Progress Notes (Signed)
Pre visit review using our clinic review tool, if applicable. No additional management support is needed unless otherwise documented below in the visit note. 

## 2014-02-02 NOTE — Progress Notes (Signed)
Subjective:    Patient ID: Doris Jackson, female    DOB: Mar 25, 1941, 73 y.o.   MRN: [id number]  HPI  73 year old patient who has type 2 diabetes.  Presently, she is on triple therapy.  Hemoglobin A1c one month ago, was 6 point 5.  Sh  Past Medical History  Diagnosis Date  . ALLERGIC RHINITIS 06/17/2010  . ANEMIA-NOS 06/17/2010  . BREAST CANCER, HX OF 06/17/2010  . COPD 06/17/2010  . HYPERLIPIDEMIA 06/17/2010  . LOW BACK PAIN 06/17/2010  . OSTEOARTHRITIS 06/17/2010  . OSTEOPENIA 06/17/2010  . Leg swelling   . Weakness   . DIABETES MELLITUS, TYPE II 06/17/2010  . HYPERTENSION 06/17/2010  . Cancer 2008    right breast  . Fibroid     FIBROID  . Endometriosis   . Cataract     History   Social History  . Marital Status: Married    Spouse Name: N/A    Number of Children: N/A  . Years of Education: N/A   Occupational History  . Not on file.   Social History Main Topics  . Smoking status: Never Smoker   . Smokeless tobacco: Never Used  . Alcohol Use: No  . Drug Use: No  . Sexual Activity: No   Other Topics Concern  . Not on file   Social History Narrative  . No narrative on file    Past Surgical History  Procedure Laterality Date  . Abdominal hysterectomy  1988    TAH,LSO  . Oophorectomy  1988    TAH,LSO  . Tubal ligation    . Breast lumpectomy  2007    LUMPECTOMY FOLLOWED BY RADIATION    Family History  Problem Relation Age of Onset  . Diabetes Mother   . Hypertension Sister   . Asthma Father   . Diabetes Brother     No Known Allergies  Current Outpatient Prescriptions on File Prior to Visit  Medication Sig Dispense Refill  . ACCU-CHEK COMPACT PLUS test strip TEST TWICE A DAY AS NEEDED  204 each  2  . amLODipine-benazepril (LOTREL) 5-40 MG per capsule TAKE ONE CAPSULE BY MOUTH EVERY DAY  30 capsule  5  . Blood Glucose Monitoring Suppl (ACCU-CHEK COMPACT CARE KIT) KIT USE AS DIRECTED  1 each  0  . Cholecalciferol (VITAMIN D) 2000 UNITS CAPS  Take 1 capsule by mouth daily. Taking 2 caps daily 4,000      . cyanocobalamin 2000 MCG tablet Take 2,000 mcg by mouth daily.        . cyclobenzaprine (FLEXERIL) 10 MG tablet Take 1 tablet (10 mg total) by mouth 3 (three) times daily as needed for muscle spasms.  30 tablet  4  . FERREX 150 150 MG capsule TAKE ONE CAPSULE BY MOUTH EVERY DAY  30 capsule  2  . fexofenadine (ALLEGRA) 180 MG tablet Take 180 mg by mouth daily as needed.       . furosemide (LASIX) 20 MG tablet Take 1 tablet (20 mg total) by mouth daily as needed.  30 tablet  0  . glipiZIDE (GLUCOTROL XL) 5 MG 24 hr tablet Take 1 tablet (5 mg total) by mouth daily.  90 tablet  5  . hydrochlorothiazide (HYDRODIURIL) 25 MG tablet TAKE 1 TABLET BY MOUTH EVERY DAY  90 tablet  4  . HYDROcodone-acetaminophen (NORCO/VICODIN) 5-325 MG per tablet Take 1 tablet by mouth every 6 (six) hours as needed.  60 tablet  0  . Lancets 30G MISC USE TWICE  DAILY AS DIRECTED FOR GLUCOSE TESTING AND MONITORING  100 each  4  . Magnesium 250 MG TABS Take 1 tablet by mouth as needed.       . metFORMIN (GLUCOPHAGE) 1000 MG tablet Take 500 mg by mouth daily with breakfast. 500 mg at breakfast and 1000 mg at dinner      . metoprolol succinate (TOPROL-XL) 50 MG 24 hr tablet TAKE 1 TABLET BY MOUTH IN THE MORNING  90 tablet  1  . Multiple Vitamin (MULTIVITAMIN) tablet Take 1 tablet by mouth daily.        . pioglitazone (ACTOS) 45 MG tablet TAKE 1 TABLET BY MOUTH EVERY DAY  90 tablet  1  . pravastatin (PRAVACHOL) 40 MG tablet TAKE 1 TABLET BY MOUTH EVERY DAY  90 tablet  0  . naproxen (NAPROSYN) 375 MG tablet Take 1 tablet (375 mg total) by mouth 2 (two) times daily.  60 tablet  5   No current facility-administered medications on file prior to visit.    BP 136/90  Pulse 87  Temp(Src) 97.8 F (36.6 C) (Oral)  Resp 20  Ht _0  (1.575 m)  Wt 242 lb (109.77 kg)  BMI 44.25 kg/m2  SpO2 98%   e has been using samples of Januvia , which over the past few weeks has  been unable to afford.  Fasting blood sugars have increased and are now in the 140-165 range fasting.  Blood sugars in the late afternoon are consistently over 200  Review of Systems  Constitutional: Negative.   HENT: Negative for congestion, dental problem, hearing loss, rhinorrhea, sinus pressure, sore throat and tinnitus.   Eyes: Negative for pain, discharge and visual disturbance.  Respiratory: Negative for cough and shortness of breath.   Cardiovascular: Negative for chest pain, palpitations and leg swelling.  Gastrointestinal: Negative for nausea, vomiting, abdominal pain, diarrhea, constipation, blood in stool and abdominal distention.  Genitourinary: Negative for dysuria, urgency, frequency, hematuria, flank pain, vaginal bleeding, vaginal discharge, difficulty urinating, vaginal pain and pelvic pain.  Musculoskeletal: Negative for arthralgias, gait problem and joint swelling.  Skin: Negative for rash.  Neurological: Negative for dizziness, syncope, speech difficulty, weakness, numbness and headaches.  Hematological: Negative for adenopathy.  Psychiatric/Behavioral: Negative for behavioral problems, dysphoric mood and agitation. The patient is not nervous/anxious.        Objective:   Physical Exam  Constitutional:  Blood pressure 130/84          Assessment & Plan:    Diabetes mellitus.  Patient was given information concerning patient assistant programs as well as prescription savings.  Card and coupons.  Hopefully, able to afford Januvia or alternate drugs.  The patient was also given names of other prescription medications that hopefully would have better coverage than Januvia

## 2014-02-02 NOTE — Patient Instructions (Signed)
Please check your hemoglobin A1c every 3 months  Limit your sodium (Salt) intake  You need to lose weight.  Consider a lower calorie diet and regular exercise.    It is important that you exercise regularly, at least 20 minutes 3 to 4 times per week.  If you develop chest pain or shortness of breath seek  medical attention.   

## 2014-02-09 ENCOUNTER — Telehealth: Payer: Self-pay | Admitting: Internal Medicine

## 2014-02-09 MED ORDER — SITAGLIPTIN PHOSPHATE 100 MG PO TABS: 100.0000 mg | ORAL_TABLET | Freq: Every day | ORAL | Status: DC

## 2014-02-09 NOTE — Telephone Encounter (Signed)
Pt called and request a rx on Januvia . She said she can get it through  mail order for 90 days .  Phone number; [phone]

## 2014-02-09 NOTE — Telephone Encounter (Signed)
Left message on voicemail to call office.  

## 2014-02-09 NOTE — Telephone Encounter (Signed)
Pt called back, asked her if what the dosage is of Januvia? Pt stated she has been taking 100 mg. Told pt okay will send Rx to [facility] for her. Pt verbalized understanding. Rx sent.

## 2014-02-10 ENCOUNTER — Telehealth: Payer: Self-pay | Admitting: *Deleted

## 2014-02-10 DIAGNOSIS — E785 Hyperlipidemia, unspecified: Secondary | ICD-10-CM

## 2014-02-10 DIAGNOSIS — E119 Type 2 diabetes mellitus without complications: Secondary | ICD-10-CM

## 2014-02-10 NOTE — Telephone Encounter (Signed)
Left message on machine for patient to schedule a lab appointment Lipid Diabetic bundle

## 2014-02-10 NOTE — Telephone Encounter (Signed)
Error

## 2014-02-14 NOTE — Telephone Encounter (Signed)
Pt states she takes 100 mg, pt states that now the pharmacy is telling her they need prior authorization in order for the rx to be filled , pt states you can call [phone], they told her they need more information to process the request.

## 2014-02-14 NOTE — Telephone Encounter (Signed)
Error/gd °

## 2014-02-16 MED ORDER — SITAGLIPTIN PHOSPHATE 100 MG PO TABS: 100.0000 mg | ORAL_TABLET | Freq: Every day | ORAL | Status: DC

## 2014-02-16 NOTE — Telephone Encounter (Signed)
I called [facility] and was advised the Januvia was a re-fill to soon. It isn't rejecting for a PA. Pt can have it filled on 03/08/14.

## 2014-02-16 NOTE — Telephone Encounter (Signed)
Doris Jackson, can you look into this for me, pt was started on Januvia 100 mg awhile ago and due to cost was going to stop but wants to continue and needs prior authorization now.

## 2014-02-16 NOTE — Addendum Note (Signed)
Addended by: Marian Sorrow on: 02/16/2014 02:19 PM   Modules accepted: Orders

## 2014-02-16 NOTE — Telephone Encounter (Signed)
Spoke to pt told her she does not need PA for Januvia just has to wait till 9/9 for refill. Pt verbalized understanding and stated she got Rx straightened out and is [facility] and that [facility] and will need [facility]. Told pt okay will send Rx. Pt verbalized understanding. Rx sent

## 2014-03-08 ENCOUNTER — Telehealth: Payer: Self-pay | Admitting: Internal Medicine

## 2014-03-08 MED ORDER — HYDROCODONE-ACETAMINOPHEN 5-325 MG PO TABS: 1.0000 | ORAL_TABLET | Freq: Four times a day (QID) | ORAL | Status: DC | PRN

## 2014-03-08 NOTE — Telephone Encounter (Signed)
Pt notified Rx ready for pickup. Rx printed and signed.  

## 2014-03-08 NOTE — Telephone Encounter (Signed)
Pt needs new rx hydrocodone °

## 2014-03-18 ENCOUNTER — Other Ambulatory Visit: Payer: Self-pay | Admitting: Internal Medicine

## 2014-04-06 ENCOUNTER — Ambulatory Visit (INDEPENDENT_AMBULATORY_CARE_PROVIDER_SITE_OTHER): Payer: Medicare Other

## 2014-04-06 DIAGNOSIS — Z23 Encounter for immunization: Secondary | ICD-10-CM

## 2014-04-10 ENCOUNTER — Other Ambulatory Visit: Payer: Self-pay | Admitting: Internal Medicine

## 2014-04-16 ENCOUNTER — Other Ambulatory Visit: Payer: Self-pay | Admitting: Internal Medicine

## 2014-04-19 ENCOUNTER — Telehealth: Payer: Self-pay | Admitting: Internal Medicine

## 2014-04-19 NOTE — Telephone Encounter (Signed)
Pt request refill HYDROcodone-acetaminophen (NORCO/VICODIN) 5-325 MG per tablet °

## 2014-04-20 MED ORDER — HYDROCODONE-ACETAMINOPHEN 5-325 MG PO TABS: 1.0000 | ORAL_TABLET | Freq: Four times a day (QID) | ORAL | Status: DC | PRN

## 2014-04-20 NOTE — Telephone Encounter (Signed)
Pt notified Rx ready for pickup. Rx printed and signed.  

## 2014-04-22 ENCOUNTER — Other Ambulatory Visit: Payer: Self-pay | Admitting: Internal Medicine

## 2014-05-01 ENCOUNTER — Encounter: Payer: Self-pay | Admitting: Internal Medicine

## 2014-05-10 ENCOUNTER — Other Ambulatory Visit: Payer: Self-pay | Admitting: Internal Medicine

## 2014-05-17 ENCOUNTER — Telehealth: Payer: Self-pay | Admitting: Internal Medicine

## 2014-05-17 NOTE — Telephone Encounter (Signed)
Pt would like donna to call her concerning increase in price of [place].

## 2014-05-18 LAB — HM DEXA SCAN

## 2014-05-18 LAB — HM MAMMOGRAPHY

## 2014-05-18 NOTE — Telephone Encounter (Signed)
Spoke to pt, said her Celesta Gentile has increased in price due to being in the donut hole, but will drop again in Jan. Pt wanted to know when due to come in. Told pt she was suppose to come back in 3 months is due now. Pt verbalized understanding and transferred to scheduling.

## 2014-05-22 ENCOUNTER — Encounter: Payer: Self-pay | Admitting: Internal Medicine

## 2014-05-23 ENCOUNTER — Telehealth: Payer: Self-pay | Admitting: Internal Medicine

## 2014-05-23 MED ORDER — HYDROCODONE-ACETAMINOPHEN 5-325 MG PO TABS: 1.0000 | ORAL_TABLET | Freq: Four times a day (QID) | ORAL | Status: DC | PRN

## 2014-05-23 NOTE — Telephone Encounter (Signed)
Rx printed and ready for pick up.  Left a message for pt making aware rx ready for pick up.

## 2014-05-23 NOTE — Telephone Encounter (Signed)
Pt would like to pick up her refill before we close for Thanksgiving if possible. Please call when ready, ok to leave a message.

## 2014-06-06 ENCOUNTER — Encounter: Payer: Self-pay | Admitting: Internal Medicine

## 2014-06-20 ENCOUNTER — Encounter: Payer: Self-pay | Admitting: Oncology

## 2014-06-26 ENCOUNTER — Telehealth: Payer: Self-pay | Admitting: Internal Medicine

## 2014-06-26 MED ORDER — HYDROCODONE-ACETAMINOPHEN 5-325 MG PO TABS: 1.0000 | ORAL_TABLET | Freq: Four times a day (QID) | ORAL | Status: DC | PRN

## 2014-06-26 NOTE — Telephone Encounter (Signed)
Pt request refill  °HYDROcodone-acetaminophen (NORCO/VICODIN) 5-325 MG  °

## 2014-06-26 NOTE — Telephone Encounter (Signed)
Is this ok to refill?  

## 2014-06-26 NOTE — Telephone Encounter (Signed)
Pt notified Rx ready for pickup. Rx printed and signed.  

## 2014-06-26 NOTE — Telephone Encounter (Signed)
ok 

## 2014-06-29 ENCOUNTER — Ambulatory Visit: Payer: Medicare Other | Admitting: Internal Medicine

## 2014-07-06 ENCOUNTER — Ambulatory Visit (INDEPENDENT_AMBULATORY_CARE_PROVIDER_SITE_OTHER): Payer: PPO | Admitting: Internal Medicine

## 2014-07-06 ENCOUNTER — Encounter: Payer: Self-pay | Admitting: *Deleted

## 2014-07-06 ENCOUNTER — Encounter: Payer: Self-pay | Admitting: Internal Medicine

## 2014-07-06 VITALS — BP 130/80 | HR 101 | Temp 97.9°F | Resp 20 | Ht 62.0 in | Wt 245.0 lb

## 2014-07-06 DIAGNOSIS — E119 Type 2 diabetes mellitus without complications: Secondary | ICD-10-CM

## 2014-07-06 DIAGNOSIS — E785 Hyperlipidemia, unspecified: Secondary | ICD-10-CM

## 2014-07-06 DIAGNOSIS — I10 Essential (primary) hypertension: Secondary | ICD-10-CM

## 2014-07-06 LAB — LIPID PANEL
CHOLESTEROL: 183 mg/dL (ref 0–200)
HDL: 40.9 mg/dL (ref >39.00)
LDL CALC: 116 mg/dL — AB (ref 0–99)
NonHDL: 142.1
TRIGLYCERIDES: 132 mg/dL (ref 0.0–149.0)
Total CHOL/HDL Ratio: 4
VLDL: 26.4 mg/dL (ref 0.0–40.0)

## 2014-07-06 LAB — CBC WITH DIFFERENTIAL/PLATELET
BASOS PCT: 0.5 % (ref 0.0–3.0)
Basophils Absolute: 0 10*3/uL (ref 0.0–0.1)
Eosinophils Absolute: 0.1 10*3/uL (ref 0.0–0.7)
Eosinophils Relative: 2 % (ref 0.0–5.0)
HCT: 33.5 % — ABNORMAL LOW (ref 36.0–46.0)
HEMOGLOBIN: 11 g/dL — AB (ref 12.0–15.0)
LYMPHS PCT: 22.4 % (ref 12.0–46.0)
Lymphs Abs: 1.6 10*3/uL (ref 0.7–4.0)
MCHC: 32.8 g/dL (ref 30.0–36.0)
MCV: 80.8 fl (ref 78.0–100.0)
Monocytes Absolute: 0.8 10*3/uL (ref 0.1–1.0)
Monocytes Relative: 11.4 % (ref 3.0–12.0)
NEUTROS ABS: 4.6 10*3/uL (ref 1.4–7.7)
Neutrophils Relative %: 63.7 % (ref 43.0–77.0)
Platelets: 152 10*3/uL (ref 150.0–400.0)
RBC: 4.14 Mil/uL (ref 3.87–5.11)
RDW: 18 % — AB (ref 11.5–15.5)
WBC: 7.2 10*3/uL (ref 4.0–10.5)

## 2014-07-06 LAB — COMPREHENSIVE METABOLIC PANEL
ALBUMIN: 3.7 g/dL (ref 3.5–5.2)
ALT: 12 U/L (ref 0–35)
AST: 13 U/L (ref 0–37)
Alkaline Phosphatase: 78 U/L (ref 39–117)
BUN: 24 mg/dL — ABNORMAL HIGH (ref 6–23)
CALCIUM: 9.3 mg/dL (ref 8.4–10.5)
CHLORIDE: 101 meq/L (ref 96–112)
CO2: 28 mEq/L (ref 19–32)
Creatinine, Ser: 0.9 mg/dL (ref 0.4–1.2)
GFR: 77.07 mL/min (ref >60.00)
GLUCOSE: 118 mg/dL — AB (ref 70–99)
POTASSIUM: 4.2 meq/L (ref 3.5–5.1)
Sodium: 138 mEq/L (ref 135–145)
Total Bilirubin: 0.6 mg/dL (ref 0.2–1.2)
Total Protein: 7.5 g/dL (ref 6.0–8.3)

## 2014-07-06 LAB — HEMOGLOBIN A1C: Hgb A1c MFr Bld: 6.9 % — ABNORMAL HIGH (ref 4.6–6.5)

## 2014-07-06 LAB — TSH: TSH: 0.91 u[IU]/mL (ref 0.35–4.50)

## 2014-07-06 NOTE — Patient Instructions (Signed)
Limit your sodium (Salt) intake   Please check your hemoglobin A1c every 3 months  You need to lose weight.  Consider a lower calorie diet and regular exercise. 

## 2014-07-06 NOTE — Progress Notes (Signed)
Subjective:    Patient ID: Doris Jackson, female    DOB: 1940-11-05, 74 y.o.   MRN: [id number]  HPI 74 year old patient who is seen today for follow-up of type 2 diabetes, hypertension and dyslipidemia.  She is doing quite well.  No focal complaints. Last hemoglobin A1c 6 months ago was 6.4. She has had an eye examination in 2015  Past Medical History  Diagnosis Date  . ALLERGIC RHINITIS 06/17/2010  . ANEMIA-NOS 06/17/2010  . BREAST CANCER, HX OF 06/17/2010  . COPD 06/17/2010  . HYPERLIPIDEMIA 06/17/2010  . LOW BACK PAIN 06/17/2010  . OSTEOARTHRITIS 06/17/2010  . OSTEOPENIA 06/17/2010  . Leg swelling   . Weakness   . DIABETES MELLITUS, TYPE II 06/17/2010  . HYPERTENSION 06/17/2010  . Cancer 2008    right breast  . Fibroid     FIBROID  . Endometriosis   . Cataract     History   Social History  . Marital Status: Married    Spouse Name: N/A    Number of Children: N/A  . Years of Education: N/A   Occupational History  . Not on file.   Social History Main Topics  . Smoking status: Never Smoker   . Smokeless tobacco: Never Used  . Alcohol Use: No  . Drug Use: No  . Sexual Activity: No   Other Topics Concern  . Not on file   Social History Narrative    Past Surgical History  Procedure Laterality Date  . Abdominal hysterectomy  1988    TAH,LSO  . Oophorectomy  1988    TAH,LSO  . Tubal ligation    . Breast lumpectomy  2007    LUMPECTOMY FOLLOWED BY RADIATION    Family History  Problem Relation Age of Onset  . Diabetes Mother   . Hypertension Sister   . Asthma Father   . Diabetes Brother     No Known Allergies  Current Outpatient Prescriptions on File Prior to Visit  Medication Sig Dispense Refill  . ACCU-CHEK COMPACT PLUS test strip TEST TWICE A DAY AS NEEDED 204 each 2  . amLODipine-benazepril (LOTREL) 5-40 MG per capsule TAKE ONE CAPSULE BY MOUTH EVERY DAY 30 capsule 5  . Blood Glucose Monitoring Suppl (ACCU-CHEK COMPACT CARE KIT) KIT USE AS  DIRECTED 1 each 0  . Cholecalciferol (VITAMIN D) 2000 UNITS CAPS Take 1 capsule by mouth daily. Taking 2 caps daily 4,000    . cyanocobalamin 2000 MCG tablet Take 2,000 mcg by mouth daily.      . cyclobenzaprine (FLEXERIL) 10 MG tablet Take 1 tablet (10 mg total) by mouth 3 (three) times daily as needed for muscle spasms. 30 tablet 4  . FERREX 150 150 MG capsule TAKE ONE CAPSULE BY MOUTH EVERY DAY 30 capsule 2  . fexofenadine (ALLEGRA) 180 MG tablet Take 180 mg by mouth daily as needed.     . furosemide (LASIX) 20 MG tablet Take 1 tablet (20 mg total) by mouth daily as needed. 30 tablet 0  . glipiZIDE (GLUCOTROL XL) 5 MG 24 hr tablet TAKE 1 TABLET BY MOUTH EVERY DAY 90 tablet 1  . hydrochlorothiazide (HYDRODIURIL) 25 MG tablet TAKE 1 TABLET BY MOUTH EVERY DAY 90 tablet 4  . HYDROcodone-acetaminophen (NORCO/VICODIN) 5-325 MG per tablet Take 1 tablet by mouth every 6 (six) hours as needed. 60 tablet 0  . JANUVIA 100 MG tablet TAKE 1 TABLET BY MOUTH DAILY. 90 tablet 1  . Lancets 30G MISC USE TWICE DAILY AS DIRECTED  FOR GLUCOSE TESTING AND MONITORING 100 each 4  . Magnesium 250 MG TABS Take 1 tablet by mouth as needed.     . metFORMIN (GLUCOPHAGE) 1000 MG tablet Take 500 mg by mouth daily with breakfast. 500 mg at breakfast and 1000 mg at dinner    . metoprolol succinate (TOPROL-XL) 50 MG 24 hr tablet TAKE 1 TABLET BY MOUTH IN THE MORNING 90 tablet 1  . Multiple Vitamin (MULTIVITAMIN) tablet Take 1 tablet by mouth daily.      . naproxen (NAPROSYN) 375 MG tablet Take 1 tablet (375 mg total) by mouth 2 (two) times daily. 60 tablet 5  . pioglitazone (ACTOS) 45 MG tablet TAKE 1 TABLET BY MOUTH EVERY DAY 90 tablet 0  . pravastatin (PRAVACHOL) 40 MG tablet TAKE 1 TABLET BY MOUTH EVERY DAY 90 tablet 0  . sitaGLIPtin (JANUVIA) 100 MG tablet Take 1 tablet (100 mg total) by mouth daily. 30 tablet 5   No current facility-administered medications on file prior to visit.    BP 130/80 mmHg  Pulse 101   Temp(Src) 97.9 F (36.6 C) (Oral)  Resp 20  Ht '5\' 2"'  (1.575 m)  Wt 245 lb (111.131 kg)  BMI 44.80 kg/m2  SpO2 97%      Review of Systems  Constitutional: Negative.   HENT: Negative for congestion, dental problem, hearing loss, rhinorrhea, sinus pressure, sore throat and tinnitus.   Eyes: Negative for pain, discharge and visual disturbance.  Respiratory: Negative for cough and shortness of breath.   Cardiovascular: Negative for chest pain, palpitations and leg swelling.  Gastrointestinal: Negative for nausea, vomiting, abdominal pain, diarrhea, constipation, blood in stool and abdominal distention.  Genitourinary: Negative for dysuria, urgency, frequency, hematuria, flank pain, vaginal bleeding, vaginal discharge, difficulty urinating, vaginal pain and pelvic pain.  Musculoskeletal: Negative for joint swelling, arthralgias and gait problem.  Skin: Negative for rash.  Neurological: Negative for dizziness, syncope, speech difficulty, weakness, numbness and headaches.  Hematological: Negative for adenopathy.  Psychiatric/Behavioral: Negative for behavioral problems, dysphoric mood and agitation. The patient is not nervous/anxious.        Objective:   Physical Exam  Constitutional: She is oriented to person, place, and time. She appears well-developed and well-nourished.  Obese.  Blood pressure 120/80   HENT:  Head: Normocephalic.  Right Ear: External ear normal.  Left Ear: External ear normal.  Mouth/Throat: Oropharynx is clear and moist.  Eyes: Conjunctivae and EOM are normal. Pupils are equal, round, and reactive to light.  Neck: Normal range of motion. Neck supple. No thyromegaly present.  Cardiovascular: Normal rate, regular rhythm, normal heart sounds and intact distal pulses.   Pulmonary/Chest: Effort normal and breath sounds normal.  Abdominal: Soft. Bowel sounds are normal. She exhibits no mass. There is no tenderness.  Musculoskeletal: Normal range of motion.  Trace  ankle edema  Lymphadenopathy:    She has no cervical adenopathy.  Neurological: She is alert and oriented to person, place, and time.  Skin: Skin is warm and dry. No rash noted.  Psychiatric: She has a normal mood and affect. Her behavior is normal.          Assessment & Plan:   Diabetes mellitus.  Will check a hemoglobin A1c Dyslipidemia.  Will check a lipid profile Hypertension, well-controlled  Weight loss encouraged Low-salt diet recommended Recheck 4 months

## 2014-07-06 NOTE — Progress Notes (Signed)
Pre visit review using our clinic review tool, if applicable. No additional management support is needed unless otherwise documented below in the visit note. 

## 2014-07-07 ENCOUNTER — Telehealth: Payer: Self-pay | Admitting: Internal Medicine

## 2014-07-07 NOTE — Telephone Encounter (Signed)
emmi mailed  °

## 2014-07-12 ENCOUNTER — Ambulatory Visit (INDEPENDENT_AMBULATORY_CARE_PROVIDER_SITE_OTHER): Payer: PPO | Admitting: *Deleted

## 2014-07-12 ENCOUNTER — Telehealth: Payer: Self-pay | Admitting: *Deleted

## 2014-07-12 DIAGNOSIS — Z23 Encounter for immunization: Secondary | ICD-10-CM

## 2014-07-12 NOTE — Telephone Encounter (Signed)
Dr. Raliegh Ip, pt was came to the office for pneumonia shot and asked about labs done on 1/7. Please advise

## 2014-07-13 NOTE — Telephone Encounter (Signed)
Pt called back, told her labs were normal and hemoglobin A1c was 6.9 indicating good control per Dr. Raliegh Ip. Pt verbalized understanding.

## 2014-07-13 NOTE — Telephone Encounter (Signed)
Please call/notify patient that lab/test/procedure is normal.  Hemoglobin A1c 6.9, indicating good control

## 2014-07-13 NOTE — Telephone Encounter (Signed)
Left message on voicemail to call office.  

## 2014-07-26 ENCOUNTER — Telehealth: Payer: Self-pay | Admitting: Internal Medicine

## 2014-07-26 ENCOUNTER — Other Ambulatory Visit: Payer: Self-pay | Admitting: Internal Medicine

## 2014-07-26 MED ORDER — HYDROCODONE-ACETAMINOPHEN 5-325 MG PO TABS: 1.0000 | ORAL_TABLET | Freq: Four times a day (QID) | ORAL | Status: DC | PRN

## 2014-07-26 NOTE — Telephone Encounter (Signed)
Pt notified Rx ready for pickup. Rx printed and signed.  

## 2014-07-26 NOTE — Telephone Encounter (Signed)
Pt request refill of the following: HYDROcodone-acetaminophen (NORCO/VICODIN) 5-325 MG per tablet ° ° °Phamacy: °

## 2014-08-01 ENCOUNTER — Other Ambulatory Visit: Payer: Self-pay

## 2014-08-01 DIAGNOSIS — E119 Type 2 diabetes mellitus without complications: Secondary | ICD-10-CM

## 2014-08-01 MED ORDER — ONETOUCH LANCETS MISC: Status: AC

## 2014-08-01 MED ORDER — GLUCOSE BLOOD VI STRP: ORAL_STRIP | Status: DC

## 2014-08-01 NOTE — Telephone Encounter (Signed)
[facility] stating that Accu-chek is no longer covered by insuracne:  Please send new rx for meter, strips, and lancets for Preestyle, Precision, or Onetouch.    Rx sent to pharmacy for Onetouch strips and lancets.

## 2014-08-22 ENCOUNTER — Telehealth: Payer: Self-pay | Admitting: Internal Medicine

## 2014-08-22 MED ORDER — CYCLOBENZAPRINE HCL 10 MG PO TABS: 10.0000 mg | ORAL_TABLET | Freq: Three times a day (TID) | ORAL | Status: DC | PRN

## 2014-08-22 NOTE — Telephone Encounter (Signed)
Pt notified Rx sent to pharmacy

## 2014-08-22 NOTE — Telephone Encounter (Signed)
Pt needs refill on generic flexeril 10 mg #30 w/refills. [facility] [address]

## 2014-08-30 ENCOUNTER — Telehealth: Payer: Self-pay | Admitting: Internal Medicine

## 2014-08-30 MED ORDER — HYDROCODONE-ACETAMINOPHEN 5-325 MG PO TABS: 1.0000 | ORAL_TABLET | Freq: Four times a day (QID) | ORAL | Status: DC | PRN

## 2014-08-30 NOTE — Telephone Encounter (Signed)
Pt notified Rx ready for pickup. Rx printed and signed.  

## 2014-08-30 NOTE — Telephone Encounter (Signed)
Pt request refill HYDROcodone-acetaminophen (NORCO/VICODIN) 5-325 MG per tablet °

## 2014-09-05 ENCOUNTER — Other Ambulatory Visit: Payer: Self-pay | Admitting: Internal Medicine

## 2014-09-15 ENCOUNTER — Other Ambulatory Visit: Payer: Self-pay | Admitting: Internal Medicine

## 2014-10-04 ENCOUNTER — Other Ambulatory Visit: Payer: Self-pay | Admitting: Internal Medicine

## 2014-10-05 ENCOUNTER — Telehealth: Payer: Self-pay | Admitting: Internal Medicine

## 2014-10-05 NOTE — Telephone Encounter (Signed)
Pt request refill of the following: HYDROcodone-acetaminophen (NORCO) 10-325 MG per tablet ° ° °Phamacy: ° °

## 2014-10-06 MED ORDER — HYDROCODONE-ACETAMINOPHEN 5-325 MG PO TABS: 1.0000 | ORAL_TABLET | Freq: Four times a day (QID) | ORAL | Status: DC | PRN

## 2014-10-06 NOTE — Telephone Encounter (Signed)
Pt notified Rx ready for pickup. Rx printed and signed.  

## 2014-10-06 NOTE — Telephone Encounter (Signed)
Rx signed by Roxy Cedar.

## 2014-11-01 ENCOUNTER — Other Ambulatory Visit: Payer: Self-pay | Admitting: Internal Medicine

## 2014-11-08 ENCOUNTER — Telehealth: Payer: Self-pay | Admitting: Internal Medicine

## 2014-11-08 MED ORDER — HYDROCODONE-ACETAMINOPHEN 5-325 MG PO TABS: 1.0000 | ORAL_TABLET | Freq: Four times a day (QID) | ORAL | Status: DC | PRN

## 2014-11-08 NOTE — Telephone Encounter (Signed)
Pt notified Rx ready for pickup. Rx printed and signed.  

## 2014-11-08 NOTE — Telephone Encounter (Signed)
Pt request refill of the following: HYDROcodone-acetaminophen (NORCO/VICODIN) 5-325 MG per tablet ° ° °Phamacy: °

## 2014-11-22 ENCOUNTER — Other Ambulatory Visit: Payer: Self-pay | Admitting: Internal Medicine

## 2014-12-07 ENCOUNTER — Encounter: Payer: Self-pay | Admitting: Internal Medicine

## 2014-12-07 ENCOUNTER — Ambulatory Visit (INDEPENDENT_AMBULATORY_CARE_PROVIDER_SITE_OTHER): Payer: PPO | Admitting: Internal Medicine

## 2014-12-07 VITALS — BP 120/60 | HR 96 | Temp 97.7°F | Resp 20 | Ht 62.0 in | Wt 249.0 lb

## 2014-12-07 DIAGNOSIS — M150 Primary generalized (osteo)arthritis: Secondary | ICD-10-CM

## 2014-12-07 DIAGNOSIS — I10 Essential (primary) hypertension: Secondary | ICD-10-CM

## 2014-12-07 DIAGNOSIS — E119 Type 2 diabetes mellitus without complications: Secondary | ICD-10-CM

## 2014-12-07 DIAGNOSIS — M545 Low back pain, unspecified: Secondary | ICD-10-CM

## 2014-12-07 DIAGNOSIS — M159 Polyosteoarthritis, unspecified: Secondary | ICD-10-CM

## 2014-12-07 MED ORDER — HYDROCODONE-ACETAMINOPHEN 5-325 MG PO TABS: 1.0000 | ORAL_TABLET | Freq: Four times a day (QID) | ORAL | Status: DC | PRN

## 2014-12-07 NOTE — Progress Notes (Signed)
Subjective:    Patient ID: Doris Jackson, female    DOB: [birth year], 74 y.o.   MRN: [id number]  HPI  Lab Results  Component Value Date   HGBA1C 6.9* 07/06/2014   74 year old patient who is seen today for follow-up of type 2 diabetes.  She has done quite well, although continues to gain weight.  She is overdue for her annual eye examination.  She has essential hypertension which has been well.  No hypoglycemia. Main complaints are back and generalized arthritic pain.  This interferes with her exercise regimen.  No other concerns or complaints.  Past Medical History  Diagnosis Date  . ALLERGIC RHINITIS 06/17/2010  . ANEMIA-NOS 06/17/2010  . BREAST CANCER, HX OF 06/17/2010  . COPD 06/17/2010  . HYPERLIPIDEMIA 06/17/2010  . LOW BACK PAIN 06/17/2010  . OSTEOARTHRITIS 06/17/2010  . OSTEOPENIA 06/17/2010  . Leg swelling   . Weakness   . DIABETES MELLITUS, TYPE II 06/17/2010  . HYPERTENSION 06/17/2010  . Cancer 2008    right breast  . Fibroid     FIBROID  . Endometriosis   . Cataract     History   Social History  . Marital Status: Married    Spouse Name: N/A  . Number of Children: N/A  . Years of Education: N/A   Occupational History  . Not on file.   Social History Main Topics  . Smoking status: Never Smoker   . Smokeless tobacco: Never Used  . Alcohol Use: No  . Drug Use: No  . Sexual Activity: No   Other Topics Concern  . Not on file   Social History Narrative    Past Surgical History  Procedure Laterality Date  . Abdominal hysterectomy  1988    TAH,LSO  . Oophorectomy  1988    TAH,LSO  . Tubal ligation    . Breast lumpectomy  2007    LUMPECTOMY FOLLOWED BY RADIATION    Family History  Problem Relation Age of Onset  . Diabetes Mother   . Hypertension Sister   . Asthma Father   . Diabetes Brother     No Known Allergies  Current Outpatient Prescriptions on File Prior to Visit  Medication Sig Dispense Refill  . amLODipine-benazepril (LOTREL)  5-40 MG per capsule TAKE ONE CAPSULE BY MOUTH EVERY DAY 30 capsule 5  . Cholecalciferol (VITAMIN D) 2000 UNITS CAPS Take 1 capsule by mouth daily. Taking 2 caps daily 4,000    . cyanocobalamin 2000 MCG tablet Take 2,000 mcg by mouth daily.      . cyclobenzaprine (FLEXERIL) 10 MG tablet Take 1 tablet (10 mg total) by mouth 3 (three) times daily as needed for muscle spasms. 30 tablet 5  . FERREX 150 150 MG capsule TAKE ONE CAPSULE BY MOUTH EVERY DAY 30 capsule 5  . fexofenadine (ALLEGRA) 180 MG tablet Take 180 mg by mouth daily as needed.     . furosemide (LASIX) 20 MG tablet Take 1 tablet (20 mg total) by mouth daily as needed. 30 tablet 0  . glipiZIDE (GLUCOTROL XL) 5 MG 24 hr tablet TAKE 1 TABLET BY MOUTH EVERY DAY 90 tablet 1  . glucose blood (ONETOUCH VERIO) test strip Test twice daily. 100 each 5  . hydrochlorothiazide (HYDRODIURIL) 25 MG tablet TAKE 1 TABLET BY MOUTH EVERY DAY 90 tablet 4  . HYDROcodone-acetaminophen (NORCO/VICODIN) 5-325 MG per tablet Take 1 tablet by mouth every 6 (six) hours as needed. 60 tablet 0  . Lancets 30G MISC USE TWICE  DAILY AS DIRECTED FOR GLUCOSE TESTING AND MONITORING 100 each 4  . Magnesium 250 MG TABS Take 1 tablet by mouth as needed.     . metFORMIN (GLUCOPHAGE) 1000 MG tablet Take 500 mg by mouth daily with breakfast. 500 mg at breakfast and 1000 mg at dinner    . metoprolol succinate (TOPROL-XL) 50 MG 24 hr tablet TAKE 1 TABLET BY MOUTH IN THE MORNING 90 tablet 1  . Multiple Vitamin (MULTIVITAMIN) tablet Take 1 tablet by mouth daily.      Marland Kitchen ONE TOUCH LANCETS MISC Test twice daily. 200 each 5  . pioglitazone (ACTOS) 45 MG tablet TAKE 1 TABLET BY MOUTH EVERY DAY 90 tablet 1  . pravastatin (PRAVACHOL) 40 MG tablet TAKE 1 TABLET BY MOUTH EVERY DAY 90 tablet 0  . sitaGLIPtin (JANUVIA) 100 MG tablet Take 1 tablet (100 mg total) by mouth daily. 30 tablet 5   No current facility-administered medications on file prior to visit.    BP 120/60 mmHg  Pulse 96   Temp(Src) 97.7 F (36.5 C) (Oral)  Resp 20  Ht 5\' 2"  (1.575 m)  Wt 249 lb (112.946 kg)  BMI 45.53 kg/m2  SpO2 98%      Review of Systems  Constitutional: Positive for unexpected weight change.  HENT: Negative for congestion, dental problem, hearing loss, rhinorrhea, sinus pressure, sore throat and tinnitus.   Eyes: Negative for pain, discharge and visual disturbance.  Respiratory: Negative for cough and shortness of breath.   Cardiovascular: Negative for chest pain, palpitations and leg swelling.  Gastrointestinal: Negative for nausea, vomiting, abdominal pain, diarrhea, constipation, blood in stool and abdominal distention.  Genitourinary: Negative for dysuria, urgency, frequency, hematuria, flank pain, vaginal bleeding, vaginal discharge, difficulty urinating, vaginal pain and pelvic pain.  Musculoskeletal: Positive for back pain and arthralgias. Negative for joint swelling and gait problem.  Skin: Negative for rash.  Neurological: Negative for dizziness, syncope, speech difficulty, weakness, numbness and headaches.  Hematological: Negative for adenopathy.  Psychiatric/Behavioral: Negative for behavioral problems, dysphoric mood and agitation. The patient is not nervous/anxious.        Objective:   Physical Exam  Constitutional: She is oriented to person, place, and time. She appears well-developed and well-nourished.  Obese.  Blood pressure low normal   HENT:  Head: Normocephalic.  Right Ear: External ear normal.  Left Ear: External ear normal.  Mouth/Throat: Oropharynx is clear and moist.  Eyes: Conjunctivae and EOM are normal. Pupils are equal, round, and reactive to light.  Neck: Normal range of motion. Neck supple. No thyromegaly present.  Cardiovascular: Normal rate, regular rhythm, normal heart sounds and intact distal pulses.   Pulmonary/Chest: Effort normal and breath sounds normal.  Abdominal: Soft. Bowel sounds are normal. She exhibits no mass. There is no  tenderness.  Musculoskeletal: Normal range of motion. She exhibits edema.  Lymphadenopathy:    She has no cervical adenopathy.  Neurological: She is alert and oriented to person, place, and time.  Skin: Skin is warm and dry. No rash noted.  Psychiatric: She has a normal mood and affect. Her behavior is normal.          Assessment & Plan:   Diabetes mellitus.  Will check a hemoglobin A1c.  I examination recommended hypertension, stable .  Osteoarthritis Obesity.  Weight loss encouraged  CPX 6 months

## 2014-12-07 NOTE — Progress Notes (Signed)
Pre visit review using our clinic review tool, if applicable. No additional management support is needed unless otherwise documented below in the visit note. 

## 2014-12-07 NOTE — Patient Instructions (Signed)
Limit your sodium (Salt) intake   Please check your hemoglobin A1c every 3 months  You need to lose weight.  Consider a lower calorie diet and regular exercise.  Return in 6 months for follow-up  Please see your eye doctor yearly to check for diabetic eye damage

## 2014-12-08 LAB — HEMOGLOBIN A1C: Hgb A1c MFr Bld: 6.5 % (ref 4.6–6.5)

## 2014-12-10 ENCOUNTER — Other Ambulatory Visit: Payer: Self-pay | Admitting: Internal Medicine

## 2015-01-10 ENCOUNTER — Telehealth: Payer: Self-pay | Admitting: Internal Medicine

## 2015-01-10 NOTE — Telephone Encounter (Signed)
Pt request refill of the following: HYDROcodone-acetaminophen (NORCO/VICODIN) 5-325 MG per tablet ° ° °Phamacy: °

## 2015-01-11 MED ORDER — HYDROCODONE-ACETAMINOPHEN 5-325 MG PO TABS: 1.0000 | ORAL_TABLET | Freq: Four times a day (QID) | ORAL | Status: DC | PRN

## 2015-01-11 NOTE — Telephone Encounter (Signed)
Pt notified Rx ready for pickup. Rx printed and signed.  

## 2015-01-24 ENCOUNTER — Other Ambulatory Visit: Payer: Self-pay | Admitting: Internal Medicine

## 2015-01-25 ENCOUNTER — Encounter: Payer: Self-pay | Admitting: Internal Medicine

## 2015-01-25 ENCOUNTER — Ambulatory Visit (INDEPENDENT_AMBULATORY_CARE_PROVIDER_SITE_OTHER): Payer: PPO | Admitting: Internal Medicine

## 2015-01-25 VITALS — BP 120/80 | HR 100 | Temp 98.0°F | Resp 20 | Ht 62.0 in | Wt 249.0 lb

## 2015-01-25 DIAGNOSIS — J309 Allergic rhinitis, unspecified: Secondary | ICD-10-CM

## 2015-01-25 DIAGNOSIS — E119 Type 2 diabetes mellitus without complications: Secondary | ICD-10-CM | POA: Diagnosis not present

## 2015-01-25 DIAGNOSIS — B9789 Other viral agents as the cause of diseases classified elsewhere: Secondary | ICD-10-CM

## 2015-01-25 DIAGNOSIS — J069 Acute upper respiratory infection, unspecified: Secondary | ICD-10-CM

## 2015-01-25 DIAGNOSIS — I10 Essential (primary) hypertension: Secondary | ICD-10-CM | POA: Diagnosis not present

## 2015-01-25 NOTE — Patient Instructions (Signed)
Acute bronchitis symptoms for less than 10 days are generally not helped by antibiotics.  Take over-the-counter expectorants and cough medications such as  Mucinex DM.  Call if there is no improvement in 5 to 7 days or if  you develop worsening cough, fever, or new symptoms, such as shortness of breath or chest pain.  HOME CARE INSTRUCTIONS  Get plenty of rest.  Drink enough fluids to keep your urine clear or pale yellow (unless you have a medical condition that requires fluid restriction). Increasing fluids may help thin your respiratory secretions (sputum) and reduce chest congestion, and it will prevent dehydration.  Take medicines only as directed by your health care provider.  If you were prescribed an antibiotic medicine, finish it all even if you start to feel better.  Avoid smoking and secondhand smoke. Exposure to cigarette smoke or irritating chemicals will make bronchitis worse. If you are a smoker, consider using nicotine gum or skin patches to help control withdrawal symptoms. Quitting smoking will help your lungs heal faster.  Reduce the chances of another bout of acute bronchitis by washing your hands frequently, avoiding people with cold symptoms, and trying not to touch your hands to your mouth, nose, or eyes.   

## 2015-01-25 NOTE — Progress Notes (Signed)
Pre visit review using our clinic review tool, if applicable. No additional management support is needed unless otherwise documented below in the visit note. 

## 2015-01-25 NOTE — Progress Notes (Signed)
Subjective:    Patient ID: Doris Jackson, female    DOB: [birth year], 74 y.o.   MRN: [id number]  HPI  74 year old patient who has diabetes and essential hypertension.  She also has a history of allergic rhinitis.  She presents today with a 2 day history of dry, nonproductive cough, as well as itchy watery eyes.  She has been using Mucinex and Best boy.  No fever or productive cough.  She generally feels fairly well.  Lab Results  Component Value Date   HGBA1C 6.5 12/07/2014     Past Medical History  Diagnosis Date  . ALLERGIC RHINITIS 06/17/2010  . ANEMIA-NOS 06/17/2010  . BREAST CANCER, HX OF 06/17/2010  . COPD 06/17/2010  . HYPERLIPIDEMIA 06/17/2010  . LOW BACK PAIN 06/17/2010  . OSTEOARTHRITIS 06/17/2010  . OSTEOPENIA 06/17/2010  . Leg swelling   . Weakness   . DIABETES MELLITUS, TYPE II 06/17/2010  . HYPERTENSION 06/17/2010  . Cancer 2008    right breast  . Fibroid     FIBROID  . Endometriosis   . Cataract     History   Social History  . Marital Status: Married    Spouse Name: N/A  . Number of Children: N/A  . Years of Education: N/A   Occupational History  . Not on file.   Social History Main Topics  . Smoking status: Never Smoker   . Smokeless tobacco: Never Used  . Alcohol Use: No  . Drug Use: No  . Sexual Activity: No   Other Topics Concern  . Not on file   Social History Narrative    Past Surgical History  Procedure Laterality Date  . Abdominal hysterectomy  1988    TAH,LSO  . Oophorectomy  1988    TAH,LSO  . Tubal ligation    . Breast lumpectomy  2007    LUMPECTOMY FOLLOWED BY RADIATION    Family History  Problem Relation Age of Onset  . Diabetes Mother   . Hypertension Sister   . Asthma Father   . Diabetes Brother     No Known Allergies  Current Outpatient Prescriptions on File Prior to Visit  Medication Sig Dispense Refill  . amLODipine-benazepril (LOTREL) 5-40 MG per capsule TAKE ONE CAPSULE BY MOUTH EVERY DAY 30  capsule 5  . benzonatate (TESSALON) 200 MG capsule TAKE ONE CAPSULE BY MOUTH TWICE A DAY AS NEEDED FOR COUGH 30 capsule 1  . Cholecalciferol (VITAMIN D) 2000 UNITS CAPS Take 1 capsule by mouth daily. Taking 2 caps daily 4,000    . cyanocobalamin 2000 MCG tablet Take 2,000 mcg by mouth daily.      . cyclobenzaprine (FLEXERIL) 10 MG tablet Take 1 tablet (10 mg total) by mouth 3 (three) times daily as needed for muscle spasms. 30 tablet 5  . FERREX 150 150 MG capsule TAKE ONE CAPSULE BY MOUTH EVERY DAY 30 capsule 5  . fexofenadine (ALLEGRA) 180 MG tablet Take 180 mg by mouth daily as needed.     . furosemide (LASIX) 20 MG tablet Take 1 tablet (20 mg total) by mouth daily as needed. 30 tablet 0  . glipiZIDE (GLUCOTROL XL) 5 MG 24 hr tablet TAKE 1 TABLET BY MOUTH EVERY DAY 90 tablet 1  . glucose blood (ONETOUCH VERIO) test strip Test twice daily. 100 each 5  . hydrochlorothiazide (HYDRODIURIL) 25 MG tablet TAKE 1 TABLET BY MOUTH EVERY DAY 90 tablet 4  . HYDROcodone-acetaminophen (NORCO/VICODIN) 5-325 MG per tablet Take 1 tablet by mouth  every 6 (six) hours as needed. 60 tablet 0  . Lancets 30G MISC USE TWICE DAILY AS DIRECTED FOR GLUCOSE TESTING AND MONITORING 100 each 4  . Magnesium 250 MG TABS Take 1 tablet by mouth as needed.     . metFORMIN (GLUCOPHAGE) 1000 MG tablet TAKE 1 TABLET BY MOUTH TWICE A DAY WITH FOOD 180 tablet 1  . metoprolol succinate (TOPROL-XL) 50 MG 24 hr tablet TAKE 1 TABLET BY MOUTH IN THE MORNING 90 tablet 1  . Multiple Vitamin (MULTIVITAMIN) tablet Take 1 tablet by mouth daily.      Marland Kitchen ONE TOUCH LANCETS MISC Test twice daily. 200 each 5  . pioglitazone (ACTOS) 45 MG tablet TAKE 1 TABLET BY MOUTH EVERY DAY 90 tablet 1  . pravastatin (PRAVACHOL) 40 MG tablet TAKE 1 TABLET BY MOUTH EVERY DAY 90 tablet 0  . sitaGLIPtin (JANUVIA) 100 MG tablet Take 1 tablet (100 mg total) by mouth daily. 30 tablet 5   No current facility-administered medications on file prior to visit.    BP  120/80 mmHg  Pulse 100  Temp(Src) 98 F (36.7 C) (Oral)  Resp 20  Ht 5\' 2"  (1.575 m)  Wt 249 lb (112.946 kg)  BMI 45.53 kg/m2  SpO2 98%     Review of Systems  Constitutional: Positive for activity change, appetite change and fatigue.  HENT: Negative for congestion, dental problem, hearing loss, rhinorrhea, sinus pressure, sore throat and tinnitus.   Eyes: Positive for itching. Negative for pain, discharge and visual disturbance.  Respiratory: Positive for cough. Negative for shortness of breath.   Cardiovascular: Negative for chest pain, palpitations and leg swelling.  Gastrointestinal: Negative for nausea, vomiting, abdominal pain, diarrhea, constipation, blood in stool and abdominal distention.  Genitourinary: Negative for dysuria, urgency, frequency, hematuria, flank pain, vaginal bleeding, vaginal discharge, difficulty urinating, vaginal pain and pelvic pain.  Musculoskeletal: Negative for joint swelling, arthralgias and gait problem.  Skin: Negative for rash.  Neurological: Negative for dizziness, syncope, speech difficulty, weakness, numbness and headaches.  Hematological: Negative for adenopathy.  Psychiatric/Behavioral: Negative for behavioral problems, dysphoric mood and agitation. The patient is not nervous/anxious.        Objective:   Physical Exam  Constitutional: She is oriented to person, place, and time. She appears well-developed and well-nourished.  HENT:  Head: Normocephalic.  Right Ear: External ear normal.  Left Ear: External ear normal.  Mouth/Throat: Oropharynx is clear and moist.  Eyes: Conjunctivae and EOM are normal. Pupils are equal, round, and reactive to light.  Neck: Normal range of motion. Neck supple. No thyromegaly present.  Cardiovascular: Normal rate, regular rhythm, normal heart sounds and intact distal pulses.   Pulmonary/Chest: Effort normal and breath sounds normal. No respiratory distress. She has no wheezes. She has no rales.    Abdominal: Soft. Bowel sounds are normal. She exhibits no mass. There is no tenderness.  Musculoskeletal: Normal range of motion.  Lymphadenopathy:    She has no cervical adenopathy.  Neurological: She is alert and oriented to person, place, and time.  Skin: Skin is warm and dry. No rash noted.  Psychiatric: She has a normal mood and affect. Her behavior is normal.          Assessment & Plan:   Viral URI with cough Allergic rhinitis Hypertension, well-controlled Diabetes, excellent control  We'll continue symptomatic treatment  Routine follow-up as scheduled Will report any new or worsening symptoms

## 2015-01-26 ENCOUNTER — Ambulatory Visit: Payer: PPO | Admitting: Internal Medicine

## 2015-02-09 ENCOUNTER — Telehealth: Payer: Self-pay | Admitting: Internal Medicine

## 2015-02-09 NOTE — Telephone Encounter (Signed)
Pt request refill of the following: HYDROcodone-acetaminophen (NORCO/VICODIN) 5-325 MG per tablet ° ° °Phamacy: °

## 2015-02-09 NOTE — Telephone Encounter (Signed)
Refill ok? 

## 2015-02-12 MED ORDER — HYDROCODONE-ACETAMINOPHEN 5-325 MG PO TABS: 1.0000 | ORAL_TABLET | Freq: Four times a day (QID) | ORAL | Status: DC | PRN

## 2015-02-12 NOTE — Addendum Note (Signed)
Addended by: Marian Sorrow on: 02/12/2015 09:52 AM   Modules accepted: Orders

## 2015-02-12 NOTE — Telephone Encounter (Signed)
Pt notified Rx ready for pickup. Rx printed and signed.  

## 2015-02-12 NOTE — Telephone Encounter (Signed)
ok 

## 2015-02-21 ENCOUNTER — Telehealth: Payer: Self-pay | Admitting: Internal Medicine

## 2015-02-21 NOTE — Telephone Encounter (Signed)
Pt states her pioglitazone (ACTOS) 45 MG tablet in over $160  And she was unable to pu this med. Would like to know if dr Raliegh Ip has an alternative to her problem.  I printed a coupon I think pt can you. I will put on your desk for review

## 2015-02-22 NOTE — Telephone Encounter (Signed)
Spoke to pt, told her I have a coupon that she can try to use for her medication that she can pickup at the front desk. Pt verbalized understanding and will come by later today.

## 2015-03-07 ENCOUNTER — Other Ambulatory Visit: Payer: Self-pay | Admitting: Internal Medicine

## 2015-03-09 ENCOUNTER — Telehealth: Payer: Self-pay | Admitting: Internal Medicine

## 2015-03-09 MED ORDER — HYDROCODONE-ACETAMINOPHEN 5-325 MG PO TABS: 1.0000 | ORAL_TABLET | Freq: Four times a day (QID) | ORAL | Status: DC | PRN

## 2015-03-09 NOTE — Telephone Encounter (Signed)
Pt notified Rx ready for pickup. Rx printed and signed.  

## 2015-03-09 NOTE — Telephone Encounter (Signed)
Pt needs new rx hydrocodone °

## 2015-03-14 ENCOUNTER — Telehealth: Payer: Self-pay | Admitting: Internal Medicine

## 2015-03-14 ENCOUNTER — Other Ambulatory Visit: Payer: Self-pay | Admitting: Internal Medicine

## 2015-03-14 NOTE — Telephone Encounter (Signed)
Pt request refill of the following:  sitaGLIPtin (JANUVIA) 100 MG tablet   Pt said she is in [place] whole and is asking we can help her with a discount coupon.     Phamacy:

## 2015-03-16 NOTE — Telephone Encounter (Signed)
Spoke to pt, told her I have a coupon for you to pickup will put at the front desk. Pt verbalized understanding.

## 2015-03-21 ENCOUNTER — Other Ambulatory Visit: Payer: Self-pay | Admitting: Internal Medicine

## 2015-04-02 ENCOUNTER — Other Ambulatory Visit: Payer: Self-pay | Admitting: Internal Medicine

## 2015-04-05 ENCOUNTER — Other Ambulatory Visit: Payer: Self-pay | Admitting: Internal Medicine

## 2015-04-05 MED ORDER — HYDROCODONE-ACETAMINOPHEN 5-325 MG PO TABS: 1.0000 | ORAL_TABLET | Freq: Four times a day (QID) | ORAL | Status: DC | PRN

## 2015-04-05 NOTE — Telephone Encounter (Signed)
Rx printed and put on Dr.K's desk to sign.

## 2015-04-05 NOTE — Telephone Encounter (Signed)
Pt needs new rx hydrocodone °

## 2015-04-06 NOTE — Telephone Encounter (Signed)
rx ready for pick up and patient is aware  

## 2015-04-10 ENCOUNTER — Ambulatory Visit (INDEPENDENT_AMBULATORY_CARE_PROVIDER_SITE_OTHER): Payer: PPO | Admitting: Family Medicine

## 2015-04-10 DIAGNOSIS — Z23 Encounter for immunization: Secondary | ICD-10-CM | POA: Diagnosis not present

## 2015-04-19 ENCOUNTER — Other Ambulatory Visit: Payer: Self-pay | Admitting: Internal Medicine

## 2015-05-07 ENCOUNTER — Telehealth: Payer: Self-pay | Admitting: Internal Medicine

## 2015-05-07 NOTE — Telephone Encounter (Signed)
Sorry Doris Jackson  HYDROcodone-acetaminophen (NORCO/VICODIN) 5-325 MG tablet

## 2015-05-07 NOTE — Telephone Encounter (Signed)
Pt request refill of the following: ° ° °Phamacy: °

## 2015-05-07 NOTE — Telephone Encounter (Signed)
Doris Jackson, what is pt requesting?

## 2015-05-08 ENCOUNTER — Encounter: Payer: Self-pay | Admitting: Adult Health

## 2015-05-08 ENCOUNTER — Ambulatory Visit (INDEPENDENT_AMBULATORY_CARE_PROVIDER_SITE_OTHER): Payer: PPO | Admitting: Adult Health

## 2015-05-08 VITALS — BP 138/70 | HR 101 | Temp 97.3°F | Wt 247.3 lb

## 2015-05-08 DIAGNOSIS — I8312 Varicose veins of left lower extremity with inflammation: Secondary | ICD-10-CM | POA: Diagnosis not present

## 2015-05-08 DIAGNOSIS — I872 Venous insufficiency (chronic) (peripheral): Secondary | ICD-10-CM

## 2015-05-08 DIAGNOSIS — E119 Type 2 diabetes mellitus without complications: Secondary | ICD-10-CM | POA: Diagnosis not present

## 2015-05-08 DIAGNOSIS — I8311 Varicose veins of right lower extremity with inflammation: Secondary | ICD-10-CM | POA: Diagnosis not present

## 2015-05-08 LAB — BASIC METABOLIC PANEL
BUN: 22 mg/dL (ref 6–23)
CHLORIDE: 102 meq/L (ref 96–112)
CO2: 28 meq/L (ref 19–32)
Calcium: 9.5 mg/dL (ref 8.4–10.5)
Creatinine, Ser: 0.92 mg/dL (ref 0.40–1.20)
GFR: 76.89 mL/min (ref >60.00)
GLUCOSE: 151 mg/dL — AB (ref 70–99)
POTASSIUM: 4.2 meq/L (ref 3.5–5.1)
Sodium: 140 mEq/L (ref 135–145)

## 2015-05-08 LAB — HEMOGLOBIN A1C: Hgb A1c MFr Bld: 6.5 % (ref 4.6–6.5)

## 2015-05-08 MED ORDER — TRIAMCINOLONE ACETONIDE 0.1 % EX CREA: 1.0000 "application " | TOPICAL_CREAM | Freq: Two times a day (BID) | CUTANEOUS | Status: DC

## 2015-05-08 MED ORDER — HYDROCODONE-ACETAMINOPHEN 5-325 MG PO TABS: 1.0000 | ORAL_TABLET | Freq: Four times a day (QID) | ORAL | Status: DC | PRN

## 2015-05-08 NOTE — Patient Instructions (Addendum)
It was great meeting you today!   For the darkening of the skin, you can pick up Aquaphor cream at the pharmacy and then add a small amount of steroid cream to it.   Also use compression stockings.   I will follow up with you regarding your blood work..   Continue to follow up with Dr. Raliegh Ip as you normally do.

## 2015-05-08 NOTE — Progress Notes (Signed)
Pre visit review using our clinic review tool, if applicable. No additional management support is needed unless otherwise documented below in the visit note. 

## 2015-05-08 NOTE — Progress Notes (Signed)
Subjective:    Patient ID: Doris Jackson, female    DOB: [birth year], 74 y.o.   MRN: [id number]  HPI 74 year old female who presents to the office today for follow up of her diabetes. She last had her A1c done in June, at which time 6.5.   She is also concerned that the skin on her lower extremities is becoming hyperpigmented. She denies any redness, warmth or pain at this time. From time to time she will get a slight "ache" in her right foot and " I rub it and it goes away." She does not have have calf swelling or pain.   Review of Systems  Constitutional: Negative.   Respiratory: Negative.   Cardiovascular: Positive for leg swelling. Negative for chest pain and palpitations.  Skin: Positive for color change. Negative for pallor, rash and wound.  Neurological: Negative.   All other systems reviewed and are negative.  Past Medical History  Diagnosis Date  . ALLERGIC RHINITIS 06/17/2010  . ANEMIA-NOS 06/17/2010  . BREAST CANCER, HX OF 06/17/2010  . COPD 06/17/2010  . HYPERLIPIDEMIA 06/17/2010  . LOW BACK PAIN 06/17/2010  . OSTEOARTHRITIS 06/17/2010  . OSTEOPENIA 06/17/2010  . Leg swelling   . Weakness   . DIABETES MELLITUS, TYPE II 06/17/2010  . HYPERTENSION 06/17/2010  . Cancer [facility]) 2008    right breast  . Fibroid     FIBROID  . Endometriosis   . Cataract     Social History   Social History  . Marital Status: Married    Spouse Name: N/A  . Number of Children: N/A  . Years of Education: N/A   Occupational History  . Not on file.   Social History Main Topics  . Smoking status: Never Smoker   . Smokeless tobacco: Never Used  . Alcohol Use: No  . Drug Use: No  . Sexual Activity: No   Other Topics Concern  . Not on file   Social History Narrative    Past Surgical History  Procedure Laterality Date  . Abdominal hysterectomy  1988    TAH,LSO  . Oophorectomy  1988    TAH,LSO  . Tubal ligation    . Breast lumpectomy  2007    LUMPECTOMY FOLLOWED BY  RADIATION    Family History  Problem Relation Age of Onset  . Diabetes Mother   . Hypertension Sister   . Asthma Father   . Diabetes Brother     No Known Allergies  Current Outpatient Prescriptions on File Prior to Visit  Medication Sig Dispense Refill  . amLODipine-benazepril (LOTREL) 5-40 MG per capsule TAKE ONE CAPSULE BY MOUTH EVERY DAY 30 capsule 5  . Ascorbic Acid (VITAMIN C) 1000 MG tablet Take 1,000 mg by mouth 2 (two) times daily.    . benzonatate (TESSALON) 200 MG capsule TAKE ONE CAPSULE BY MOUTH TWICE A DAY AS NEEDED FOR COUGH 30 capsule 1  . Cholecalciferol (VITAMIN D) 2000 UNITS CAPS Take 1 capsule by mouth daily. Taking 2 caps daily 4,000    . cyanocobalamin 2000 MCG tablet Take 2,000 mcg by mouth daily.      . cyclobenzaprine (FLEXERIL) 10 MG tablet Take 1 tablet (10 mg total) by mouth 3 (three) times daily as needed for muscle spasms. 30 tablet 5  . FERREX 150 150 MG capsule TAKE ONE CAPSULE BY MOUTH EVERY DAY 30 capsule 5  . fexofenadine (ALLEGRA) 180 MG tablet Take 180 mg by mouth daily as needed.     Marland Kitchen  furosemide (LASIX) 20 MG tablet Take 1 tablet (20 mg total) by mouth daily as needed. 30 tablet 0  . glipiZIDE (GLUCOTROL XL) 5 MG 24 hr tablet TAKE 1 TABLET BY MOUTH EVERY DAY 90 tablet 3  . hydrochlorothiazide (HYDRODIURIL) 25 MG tablet TAKE 1 TABLET BY MOUTH EVERY DAY 90 tablet 4  . HYDROcodone-acetaminophen (NORCO/VICODIN) 5-325 MG tablet Take 1 tablet by mouth every 6 (six) hours as needed. 60 tablet 0  . Lancets 30G MISC USE TWICE DAILY AS DIRECTED FOR GLUCOSE TESTING AND MONITORING 100 each 4  . Magnesium 250 MG TABS Take 1 tablet by mouth as needed.     . metFORMIN (GLUCOPHAGE) 1000 MG tablet TAKE 1 TABLET BY MOUTH TWICE A DAY WITH FOOD 180 tablet 1  . metoprolol succinate (TOPROL-XL) 50 MG 24 hr tablet TAKE 1 TABLET BY MOUTH IN THE MORNING 90 tablet 2  . Multiple Vitamin (MULTIVITAMIN) tablet Take 1 tablet by mouth daily.      Marland Kitchen ONE TOUCH LANCETS MISC Test  twice daily. 200 each 5  . ONETOUCH VERIO test strip TEST TWICE DAILY. 100 each 5  . pioglitazone (ACTOS) 45 MG tablet TAKE 1 TABLET BY MOUTH EVERY DAY 90 tablet 1  . pravastatin (PRAVACHOL) 40 MG tablet TAKE 1 TABLET BY MOUTH EVERY DAY 90 tablet 1  . sitaGLIPtin (JANUVIA) 100 MG tablet Take 1 tablet (100 mg total) by mouth daily. 30 tablet 5   No current facility-administered medications on file prior to visit.    BP 138/70 mmHg  Pulse 101  Temp(Src) 97.3 F (36.3 C) (Oral)  Wt 247 lb 4.8 oz (112.175 kg)       Objective:   Physical Exam  Constitutional: She is oriented to person, place, and time. She appears well-developed and well-nourished. No distress.  Neck: Normal range of motion. Neck supple.  Cardiovascular: Normal rate, regular rhythm, normal heart sounds and intact distal pulses.  Exam reveals no gallop and no friction rub.   No murmur heard. Pulmonary/Chest: Effort normal and breath sounds normal. No respiratory distress. She has no wheezes. She has no rales. She exhibits no tenderness.  Musculoskeletal: Normal range of motion. She exhibits edema. She exhibits no tenderness.  Neurological: She is alert and oriented to person, place, and time.  Skin: Skin is warm and dry. She is not diaphoretic.  Hyperpigmentation of the skin. Appears from venous stasis dermatitis  Psychiatric: She has a normal mood and affect. Her behavior is normal. Thought content normal.  Nursing note and vitals reviewed.      Assessment & Plan:  1. Diabetes mellitus without complication ([facility]) - Hemoglobin L8G - Basic metabolic panel - Will follow up on labs 2. Stasis dermatitis of both legs - Pick up Aquaphor and mix a small amount of Kenalog cream , apply to legs twice a day - triamcinolone cream (KENALOG) 0.1 %; Apply 1 application topically 2 (two) times daily.  Dispense: 30 g; Refill: 3 - Compression socks - Elevate legs at night

## 2015-05-08 NOTE — Telephone Encounter (Signed)
Pt notified Rx ready for pickup. Rx printed and signed.  

## 2015-05-16 ENCOUNTER — Other Ambulatory Visit: Payer: Self-pay | Admitting: Internal Medicine

## 2015-05-30 LAB — HM MAMMOGRAPHY

## 2015-06-01 ENCOUNTER — Encounter: Payer: Self-pay | Admitting: Internal Medicine

## 2015-06-11 ENCOUNTER — Telehealth: Payer: Self-pay | Admitting: Internal Medicine

## 2015-06-11 MED ORDER — HYDROCODONE-ACETAMINOPHEN 5-325 MG PO TABS: 1.0000 | ORAL_TABLET | Freq: Four times a day (QID) | ORAL | Status: DC | PRN

## 2015-06-11 NOTE — Telephone Encounter (Signed)
Pt needs new rx hydrocodone °

## 2015-06-11 NOTE — Telephone Encounter (Signed)
Ok #60 

## 2015-06-11 NOTE — Telephone Encounter (Signed)
Pt last visit 05/08/15  Pt last refill 05/08/15 #60 wit NO refill

## 2015-06-11 NOTE — Telephone Encounter (Signed)
Rx ready for pick up. 

## 2015-06-12 ENCOUNTER — Encounter: Payer: Self-pay | Admitting: Oncology

## 2015-06-13 ENCOUNTER — Telehealth: Payer: Self-pay | Admitting: Internal Medicine

## 2015-06-13 NOTE — Telephone Encounter (Signed)
[employer] mandates that only one month can be prescribed at a time

## 2015-06-13 NOTE — Telephone Encounter (Signed)
Medication patient requesting to get refilled in increments of 3 months: HYDROcodone-acetaminophen (NORCO/VICODIN) 5-325 MG tablet  Historically, we have only refilled medication on 30 day refills. Please advise if wish to continue coming in monthly to get refill or if would like to fill for 3 months at a time?

## 2015-06-13 NOTE — Telephone Encounter (Signed)
Patient is aware 

## 2015-06-13 NOTE — Telephone Encounter (Signed)
I believe that the [place] board requires 30 day prescription limits

## 2015-06-13 NOTE — Telephone Encounter (Signed)
Doris Jackson came in to get her prescription and was wondering if she can get a three month refill so she doesn't have to come here every month.

## 2015-07-09 ENCOUNTER — Other Ambulatory Visit: Payer: Self-pay | Admitting: *Deleted

## 2015-07-09 ENCOUNTER — Telehealth: Payer: Self-pay | Admitting: Internal Medicine

## 2015-07-09 MED ORDER — HYDROCODONE-ACETAMINOPHEN 5-325 MG PO TABS: 1.0000 | ORAL_TABLET | Freq: Four times a day (QID) | ORAL | Status: DC | PRN

## 2015-07-09 NOTE — Telephone Encounter (Signed)
Rx done, see other message.

## 2015-07-09 NOTE — Telephone Encounter (Signed)
The patient is needing her HYDROcodone-acetaminophen (NORCO/VICODIN) 5-325 MG tablet refilled. She is her waiting here for the Rx.

## 2015-07-09 NOTE — Telephone Encounter (Signed)
Pt presented to office requesting refill on Hydrocodone. Rx printed and signed and given to pt.

## 2015-07-11 ENCOUNTER — Other Ambulatory Visit: Payer: Self-pay | Admitting: Internal Medicine

## 2015-07-17 ENCOUNTER — Other Ambulatory Visit: Payer: Self-pay | Admitting: Internal Medicine

## 2015-08-07 ENCOUNTER — Telehealth: Payer: Self-pay | Admitting: Internal Medicine

## 2015-08-07 MED ORDER — HYDROCODONE-ACETAMINOPHEN 5-325 MG PO TABS: 1.0000 | ORAL_TABLET | Freq: Four times a day (QID) | ORAL | Status: DC | PRN

## 2015-08-07 NOTE — Telephone Encounter (Signed)
Pt request refill  °HYDROcodone-acetaminophen (NORCO/VICODIN) 5-325 MG tablet °

## 2015-08-07 NOTE — Telephone Encounter (Signed)
Pt notified Rx ready for pickup. Rx printed and signed.  

## 2015-09-04 ENCOUNTER — Telehealth: Payer: Self-pay | Admitting: Internal Medicine

## 2015-09-04 MED ORDER — HYDROCODONE-ACETAMINOPHEN 5-325 MG PO TABS: 1.0000 | ORAL_TABLET | Freq: Four times a day (QID) | ORAL | Status: DC | PRN

## 2015-09-04 NOTE — Telephone Encounter (Signed)
Pt request refill  HYDROcodone-acetaminophen (NORCO/VICODIN) 5-325 MG tablet 60 tabs

## 2015-09-04 NOTE — Telephone Encounter (Signed)
Pt notified Rx ready for pickup. Rx printed and signed.  

## 2015-09-05 ENCOUNTER — Other Ambulatory Visit: Payer: Self-pay | Admitting: Internal Medicine

## 2015-09-25 ENCOUNTER — Telehealth: Payer: Self-pay | Admitting: Internal Medicine

## 2015-09-25 MED ORDER — AZELASTINE-FLUTICASONE 137-50 MCG/ACT NA SUSP: 1.0000 | Freq: Every day | NASAL | Status: DC

## 2015-09-25 NOTE — Telephone Encounter (Signed)
Spoke to pt, told her discussed with Dr.K no sample available but I do have a coupon card and can give you a Rx. Pt verbalized understanding and said she will pick up later this week put at front desk. Told her okay. Rx printed and signed, Rx and coupon card put at front desk.

## 2015-09-25 NOTE — Telephone Encounter (Signed)
Pt was given a sample of Dymista Pt states this years has been worse for her. She is taking allegra. But would like to know if we have any more samples of this or discount card.  [facility] [address]

## 2015-09-29 ENCOUNTER — Other Ambulatory Visit: Payer: Self-pay | Admitting: Internal Medicine

## 2015-10-01 ENCOUNTER — Other Ambulatory Visit: Payer: Self-pay | Admitting: Internal Medicine

## 2015-10-09 ENCOUNTER — Telehealth: Payer: Self-pay | Admitting: Internal Medicine

## 2015-10-09 MED ORDER — HYDROCODONE-ACETAMINOPHEN 5-325 MG PO TABS: 1.0000 | ORAL_TABLET | Freq: Four times a day (QID) | ORAL | Status: DC | PRN

## 2015-10-09 NOTE — Telephone Encounter (Signed)
Pt notified Rx ready for pickup. Also told pt need to schedule follow up. Pt verbalized understanding.  Rx printed and signed.

## 2015-10-09 NOTE — Telephone Encounter (Signed)
° ° ° ° ° °  Pt request refill of the following: ° °HYDROcodone-acetaminophen (NORCO/VICODIN) 5-325 MG tablet ° ° °Phamacy: °

## 2015-10-25 ENCOUNTER — Ambulatory Visit (INDEPENDENT_AMBULATORY_CARE_PROVIDER_SITE_OTHER): Payer: PPO | Admitting: Internal Medicine

## 2015-10-25 ENCOUNTER — Ambulatory Visit: Payer: PPO | Admitting: Internal Medicine

## 2015-10-25 ENCOUNTER — Encounter: Payer: Self-pay | Admitting: Internal Medicine

## 2015-10-25 VITALS — BP 116/80 | HR 100 | Temp 98.0°F | Resp 20 | Ht 62.0 in | Wt 243.0 lb

## 2015-10-25 DIAGNOSIS — E119 Type 2 diabetes mellitus without complications: Secondary | ICD-10-CM

## 2015-10-25 DIAGNOSIS — E785 Hyperlipidemia, unspecified: Secondary | ICD-10-CM

## 2015-10-25 DIAGNOSIS — I10 Essential (primary) hypertension: Secondary | ICD-10-CM | POA: Diagnosis not present

## 2015-10-25 LAB — CBC WITH DIFFERENTIAL/PLATELET
BASOS ABS: 0 10*3/uL (ref 0.0–0.1)
BASOS PCT: 0.4 % (ref 0.0–3.0)
EOS ABS: 0.2 10*3/uL (ref 0.0–0.7)
Eosinophils Relative: 2.5 % (ref 0.0–5.0)
HEMATOCRIT: 31.2 % — AB (ref 36.0–46.0)
HEMOGLOBIN: 10.4 g/dL — AB (ref 12.0–15.0)
LYMPHS PCT: 23.3 % (ref 12.0–46.0)
Lymphs Abs: 1.6 10*3/uL (ref 0.7–4.0)
MCHC: 33.2 g/dL (ref 30.0–36.0)
MCV: 78.3 fl (ref 78.0–100.0)
MONO ABS: 0.8 10*3/uL (ref 0.1–1.0)
Monocytes Relative: 12.2 % — ABNORMAL HIGH (ref 3.0–12.0)
Neutro Abs: 4.2 10*3/uL (ref 1.4–7.7)
Neutrophils Relative %: 61.6 % (ref 43.0–77.0)
PLATELETS: 183 10*3/uL (ref 150.0–400.0)
RBC: 3.99 Mil/uL (ref 3.87–5.11)
RDW: 18.2 % — AB (ref 11.5–15.5)
WBC: 6.8 10*3/uL (ref 4.0–10.5)

## 2015-10-25 LAB — MICROALBUMIN / CREATININE URINE RATIO
CREATININE, U: 151.9 mg/dL
Microalb Creat Ratio: 0.5 mg/g (ref 0.0–30.0)

## 2015-10-25 LAB — LIPID PANEL
CHOLESTEROL: 199 mg/dL (ref 0–200)
HDL: 42.1 mg/dL (ref >39.00)
LDL CALC: 132 mg/dL — AB (ref 0–99)
NonHDL: 156.4
TRIGLYCERIDES: 121 mg/dL (ref 0.0–149.0)
Total CHOL/HDL Ratio: 5
VLDL: 24.2 mg/dL (ref 0.0–40.0)

## 2015-10-25 LAB — COMPREHENSIVE METABOLIC PANEL
ALBUMIN: 3.8 g/dL (ref 3.5–5.2)
ALT: 7 U/L (ref 0–35)
AST: 9 U/L (ref 0–37)
Alkaline Phosphatase: 80 U/L (ref 39–117)
BILIRUBIN TOTAL: 0.4 mg/dL (ref 0.2–1.2)
BUN: 30 mg/dL — ABNORMAL HIGH (ref 6–23)
CALCIUM: 9.4 mg/dL (ref 8.4–10.5)
CHLORIDE: 100 meq/L (ref 96–112)
CO2: 31 mEq/L (ref 19–32)
CREATININE: 1.1 mg/dL (ref 0.40–1.20)
GFR: 62.49 mL/min (ref >60.00)
Glucose, Bld: 130 mg/dL — ABNORMAL HIGH (ref 70–99)
Potassium: 4.3 mEq/L (ref 3.5–5.1)
Sodium: 140 mEq/L (ref 135–145)
Total Protein: 7.4 g/dL (ref 6.0–8.3)

## 2015-10-25 LAB — HEMOGLOBIN A1C: HEMOGLOBIN A1C: 6.6 % — AB (ref 4.6–6.5)

## 2015-10-25 LAB — TSH: TSH: 0.99 u[IU]/mL (ref 0.35–4.50)

## 2015-10-25 NOTE — Patient Instructions (Signed)
Limit your sodium (Salt) intake   Please check your hemoglobin A1c every 3 months    It is important that you exercise regularly, at least 20 minutes 3 to 4 times per week.  If you develop chest pain or shortness of breath seek  medical attention.  You need to lose weight.  Consider a lower calorie diet and regular exercise. 

## 2015-10-25 NOTE — Progress Notes (Signed)
Subjective:    Patient ID: Doris Jackson, female    DOB: 11/21/1940, 75 y.o.   MRN: [id number]  HPI  Lab Results  Component Value Date   HGBA1C 6.5 05/08/2015    Wt Readings from Last 3 Encounters:  10/25/15 243 lb (110.224 kg)  05/08/15 247 lb 4.8 oz (112.175 kg)  01/25/15 249 lb (112.946 kg)    BP Readings from Last 3 Encounters:  10/25/15 116/80  05/08/15 138/70  01/25/15 42/79   75 year old patient who is seen today for follow-up of type 2 diabetes.  Doing very well.  No concerns or complaints.  She has obesity, essential hypertension and dyslipidemia  She has chronic leg swelling consistent with chronic venous insufficiency.  No new concerns or complaints.  Remote history of breast cancer  Past Medical History  Diagnosis Date  . ALLERGIC RHINITIS 06/17/2010  . ANEMIA-NOS 06/17/2010  . BREAST CANCER, HX OF 06/17/2010  . COPD 06/17/2010  . HYPERLIPIDEMIA 06/17/2010  . LOW BACK PAIN 06/17/2010  . OSTEOARTHRITIS 06/17/2010  . OSTEOPENIA 06/17/2010  . Leg swelling   . Weakness   . DIABETES MELLITUS, TYPE II 06/17/2010  . HYPERTENSION 06/17/2010  . Cancer [facility]) 2008    right breast  . Fibroid     FIBROID  . Endometriosis   . Cataract      Social History   Social History  . Marital Status: Married    Spouse Name: N/A  . Number of Children: N/A  . Years of Education: N/A   Occupational History  . Not on file.   Social History Main Topics  . Smoking status: Never Smoker   . Smokeless tobacco: Never Used  . Alcohol Use: No  . Drug Use: No  . Sexual Activity: No   Other Topics Concern  . Not on file   Social History Narrative    Past Surgical History  Procedure Laterality Date  . Abdominal hysterectomy  1988    TAH,LSO  . Oophorectomy  1988    TAH,LSO  . Tubal ligation    . Breast lumpectomy  2007    LUMPECTOMY FOLLOWED BY RADIATION    Family History  Problem Relation Age of Onset  . Diabetes Mother   . Hypertension Sister   . Asthma  Father   . Diabetes Brother     No Known Allergies  Current Outpatient Prescriptions on File Prior to Visit  Medication Sig Dispense Refill  . amLODipine-benazepril (LOTREL) 5-40 MG capsule TAKE ONE CAPSULE BY MOUTH EVERY DAY 30 capsule 5  . Ascorbic Acid (VITAMIN C) 1000 MG tablet Take 1,000 mg by mouth 2 (two) times daily.    . Azelastine-Fluticasone 137-50 MCG/ACT SUSP Place 1 spray into both nostrils daily. 1 Bottle 2  . benzonatate (TESSALON) 200 MG capsule TAKE ONE CAPSULE BY MOUTH TWICE A DAY AS NEEDED FOR COUGH 30 capsule 1  . Cholecalciferol (VITAMIN D) 2000 UNITS CAPS Take 1 capsule by mouth daily. Taking 2 caps daily 4,000    . cyanocobalamin 2000 MCG tablet Take 2,000 mcg by mouth daily.      . cyclobenzaprine (FLEXERIL) 10 MG tablet TAKE 1 TABLET BY MOUTH 3 TIMES A DAY AS NEEDED FOR MUSCLE SPASMS 30 tablet 5  . FERREX 150 150 MG capsule TAKE ONE CAPSULE BY MOUTH EVERY DAY 30 capsule 5  . fexofenadine (ALLEGRA) 180 MG tablet Take 180 mg by mouth daily as needed.     . furosemide (LASIX) 20 MG tablet Take 1 tablet (  20 mg total) by mouth daily as needed. 30 tablet 0  . glipiZIDE (GLUCOTROL XL) 5 MG 24 hr tablet TAKE 1 TABLET BY MOUTH EVERY DAY 90 tablet 3  . hydrochlorothiazide (HYDRODIURIL) 25 MG tablet TAKE 1 TABLET BY MOUTH EVERY DAY 90 tablet 1  . HYDROcodone-acetaminophen (NORCO/VICODIN) 5-325 MG tablet Take 1 tablet by mouth every 6 (six) hours as needed. 60 tablet 0  . Lancets 30G MISC USE TWICE DAILY AS DIRECTED FOR GLUCOSE TESTING AND MONITORING 100 each 4  . Magnesium 250 MG TABS Take 1 tablet by mouth as needed.     . metFORMIN (GLUCOPHAGE) 1000 MG tablet TAKE 1 TABLET BY MOUTH TWICE A DAY WITH FOOD 180 tablet 1  . metoprolol succinate (TOPROL-XL) 50 MG 24 hr tablet TAKE 1 TABLET BY MOUTH IN THE MORNING 90 tablet 2  . Multiple Vitamin (MULTIVITAMIN) tablet Take 1 tablet by mouth daily.      Marland Kitchen ONE TOUCH LANCETS MISC Test twice daily. 200 each 5  . ONETOUCH VERIO test  strip TEST TWICE DAILY. 100 each 5  . pioglitazone (ACTOS) 45 MG tablet TAKE 1 TABLET BY MOUTH EVERY DAY 90 tablet 1  . pravastatin (PRAVACHOL) 40 MG tablet TAKE 1 TABLET BY MOUTH EVERY DAY 90 tablet 1  . sitaGLIPtin (JANUVIA) 100 MG tablet Take 1 tablet (100 mg total) by mouth daily. 30 tablet 5  . triamcinolone cream (KENALOG) 0.1 % Apply 1 application topically 2 (two) times daily. 30 g 3   No current facility-administered medications on file prior to visit.    BP 116/80 mmHg  Pulse 100  Temp(Src) 98 F (36.7 C) (Oral)  Resp 20  Ht 5\' 2"  (1.575 m)  Wt 243 lb (110.224 kg)  BMI 44.43 kg/m2  SpO2 98%    Review of Systems  Constitutional: Negative.   HENT: Negative for congestion, dental problem, hearing loss, rhinorrhea, sinus pressure, sore throat and tinnitus.   Eyes: Negative for pain, discharge and visual disturbance.  Respiratory: Negative for cough and shortness of breath.   Cardiovascular: Positive for leg swelling. Negative for chest pain and palpitations.  Gastrointestinal: Negative for nausea, vomiting, abdominal pain, diarrhea, constipation, blood in stool and abdominal distention.  Genitourinary: Negative for dysuria, urgency, frequency, hematuria, flank pain, vaginal bleeding, vaginal discharge, difficulty urinating, vaginal pain and pelvic pain.  Musculoskeletal: Negative for joint swelling, arthralgias and gait problem.  Skin: Negative for rash.  Neurological: Negative for dizziness, syncope, speech difficulty, weakness, numbness and headaches.  Hematological: Negative for adenopathy.  Psychiatric/Behavioral: Negative for behavioral problems, dysphoric mood and agitation. The patient is not nervous/anxious.        Objective:   Physical Exam  Constitutional:  Obese Weight 243  Musculoskeletal: She exhibits edema.  Right lower leg edema, mild with stasis changes          Assessment & Plan:   Diabetes mellitus.  Appears to be in good control.  Will  check hemoglobin A1c.  We'll check lipid profile Hypertension, well-controlled Osteoarthritis Obesity.  Mild improvement  CPX 3 months

## 2015-10-28 ENCOUNTER — Other Ambulatory Visit: Payer: Self-pay | Admitting: Internal Medicine

## 2015-11-05 ENCOUNTER — Telehealth: Payer: Self-pay | Admitting: Internal Medicine

## 2015-11-05 NOTE — Telephone Encounter (Signed)
Okay to refill? 

## 2015-11-05 NOTE — Telephone Encounter (Signed)
Pt request refill  °HYDROcodone-acetaminophen (NORCO/VICODIN) 5-325 MG tablet °

## 2015-11-06 MED ORDER — HYDROCODONE-ACETAMINOPHEN 5-325 MG PO TABS: 1.0000 | ORAL_TABLET | Freq: Four times a day (QID) | ORAL | Status: DC | PRN

## 2015-11-06 NOTE — Telephone Encounter (Signed)
Pt notified Rx ready for pickup. Rx printed and signed.  

## 2015-11-12 ENCOUNTER — Other Ambulatory Visit: Payer: Self-pay | Admitting: Internal Medicine

## 2015-11-13 ENCOUNTER — Ambulatory Visit (INDEPENDENT_AMBULATORY_CARE_PROVIDER_SITE_OTHER): Payer: PPO | Admitting: *Deleted

## 2015-11-13 DIAGNOSIS — Z111 Encounter for screening for respiratory tuberculosis: Secondary | ICD-10-CM

## 2015-11-15 LAB — TB SKIN TEST
Induration: 0 mm
TB Skin Test: NEGATIVE

## 2015-12-03 ENCOUNTER — Telehealth: Payer: Self-pay | Admitting: Internal Medicine

## 2015-12-03 NOTE — Telephone Encounter (Signed)
° ° ° ° ° °  Pt request refill of the following: ° °HYDROcodone-acetaminophen (NORCO/VICODIN) 5-325 MG tablet ° ° °Phamacy: °

## 2015-12-04 ENCOUNTER — Other Ambulatory Visit: Payer: Self-pay | Admitting: Internal Medicine

## 2015-12-04 MED ORDER — HYDROCODONE-ACETAMINOPHEN 5-325 MG PO TABS: 1.0000 | ORAL_TABLET | Freq: Four times a day (QID) | ORAL | Status: DC | PRN

## 2015-12-04 NOTE — Telephone Encounter (Signed)
Left message on voicemail Rx ready for pickup, will be at the front desk. Rx printed and signed. 

## 2015-12-29 ENCOUNTER — Other Ambulatory Visit: Payer: Self-pay | Admitting: Internal Medicine

## 2016-01-02 ENCOUNTER — Telehealth: Payer: Self-pay | Admitting: Internal Medicine

## 2016-01-02 NOTE — Telephone Encounter (Signed)
Pt is having ankle swelling and would like a new rx furosemide 20 mg #30 [facility] [address]. Pt last had med refill in 2013

## 2016-01-04 MED ORDER — HYDROCODONE-ACETAMINOPHEN 5-325 MG PO TABS: 1.0000 | ORAL_TABLET | Freq: Four times a day (QID) | ORAL | Status: DC | PRN

## 2016-01-04 MED ORDER — FUROSEMIDE 20 MG PO TABS: 20.0000 mg | ORAL_TABLET | Freq: Every day | ORAL | Status: DC | PRN

## 2016-01-04 NOTE — Addendum Note (Signed)
Addended by: Marian Sorrow on: 01/04/2016 02:22 PM   Modules accepted: Orders

## 2016-01-04 NOTE — Telephone Encounter (Signed)
Okay to fill Furosemide?

## 2016-01-04 NOTE — Telephone Encounter (Signed)
Pt notified Rx for Hydrocodone ready for pickup and Rx for Lasix sent to pharmacy. Rx printed and signed.

## 2016-01-04 NOTE — Telephone Encounter (Signed)
Left message on voicemail to call office.  

## 2016-01-04 NOTE — Telephone Encounter (Signed)
Okay to refill furosemide 

## 2016-01-19 ENCOUNTER — Other Ambulatory Visit: Payer: Self-pay | Admitting: Internal Medicine

## 2016-02-01 ENCOUNTER — Ambulatory Visit (INDEPENDENT_AMBULATORY_CARE_PROVIDER_SITE_OTHER): Payer: PPO | Admitting: Internal Medicine

## 2016-02-01 ENCOUNTER — Encounter: Payer: Self-pay | Admitting: Internal Medicine

## 2016-02-01 VITALS — BP 122/68 | HR 89 | Temp 97.5°F | Wt 243.2 lb

## 2016-02-01 DIAGNOSIS — J3089 Other allergic rhinitis: Secondary | ICD-10-CM

## 2016-02-01 DIAGNOSIS — K130 Diseases of lips: Secondary | ICD-10-CM

## 2016-02-01 DIAGNOSIS — I10 Essential (primary) hypertension: Secondary | ICD-10-CM

## 2016-02-01 DIAGNOSIS — R600 Localized edema: Secondary | ICD-10-CM

## 2016-02-01 MED ORDER — HYDROCODONE-ACETAMINOPHEN 5-325 MG PO TABS: 1.0000 | ORAL_TABLET | Freq: Four times a day (QID) | ORAL | 0 refills | Status: DC | PRN

## 2016-02-01 NOTE — Progress Notes (Signed)
Pre visit review using our clinic review tool, if applicable. No additional management support is needed unless otherwise documented below in the visit note. 

## 2016-02-01 NOTE — Progress Notes (Signed)
Subjective:    Patient ID: Doris Jackson, female    DOB: Mar 04, 1941, 75 y.o.   MRN: [id number]  HPI 75 year old patient who has essential hypertension and type 2 diabetes.  She is followed by allergy medicine and does receive immunotherapy.  She presents today with a chief complaint of swelling involving her upper lip.  This occurred 2 days ago and resolved after approximately 12 hours.  She states that this has occurred once or twice prior.  The last episode about 2 months ago She has no other associated symptoms, wheezing, or swallowing difficulty.  Denies any swelling of the tongue.  No obvious precipitant  Past Medical History:  Diagnosis Date  . ALLERGIC RHINITIS 06/17/2010  . ANEMIA-NOS 06/17/2010  . BREAST CANCER, HX OF 06/17/2010  . Cancer [facility]) 2008   right breast  . Cataract   . COPD 06/17/2010  . DIABETES MELLITUS, TYPE II 06/17/2010  . Endometriosis   . Fibroid    FIBROID  . HYPERLIPIDEMIA 06/17/2010  . HYPERTENSION 06/17/2010  . Leg swelling   . LOW BACK PAIN 06/17/2010  . OSTEOARTHRITIS 06/17/2010  . OSTEOPENIA 06/17/2010  . Weakness      Social History   Social History  . Marital status: Married    Spouse name: N/A  . Number of children: N/A  . Years of education: N/A   Occupational History  . Not on file.   Social History Main Topics  . Smoking status: Never Smoker  . Smokeless tobacco: Never Used  . Alcohol use No  . Drug use: No  . Sexual activity: No   Other Topics Concern  . Not on file   Social History Narrative  . No narrative on file    Past Surgical History:  Procedure Laterality Date  . ABDOMINAL HYSTERECTOMY  1988   TAH,LSO  . BREAST LUMPECTOMY  2007   LUMPECTOMY FOLLOWED BY RADIATION  . OOPHORECTOMY  1988   TAH,LSO  . TUBAL LIGATION      Family History  Problem Relation Age of Onset  . Diabetes Mother   . Hypertension Sister   . Asthma Father   . Diabetes Brother     No Known Allergies  Current Outpatient  Prescriptions on File Prior to Visit  Medication Sig Dispense Refill  . amLODipine-benazepril (LOTREL) 5-40 MG capsule TAKE ONE CAPSULE BY MOUTH EVERY DAY 30 capsule 5  . Ascorbic Acid (VITAMIN C) 1000 MG tablet Take 1,000 mg by mouth 2 (two) times daily.    . Azelastine-Fluticasone 137-50 MCG/ACT SUSP Place 1 spray into both nostrils daily. 1 Bottle 2  . benzonatate (TESSALON) 200 MG capsule TAKE ONE CAPSULE BY MOUTH TWICE A DAY AS NEEDED FOR COUGH 30 capsule 1  . Cholecalciferol (VITAMIN D) 2000 UNITS CAPS Take 1 capsule by mouth daily. Taking 2 caps daily 4,000    . cyanocobalamin 2000 MCG tablet Take 2,000 mcg by mouth daily.      . cyclobenzaprine (FLEXERIL) 10 MG tablet TAKE 1 TABLET BY MOUTH 3 TIMES A DAY AS NEEDED FOR MUSCLE SPASMS 30 tablet 5  . FERREX 150 150 MG capsule TAKE ONE CAPSULE BY MOUTH EVERY DAY 30 capsule 5  . fexofenadine (ALLEGRA) 180 MG tablet Take 180 mg by mouth daily as needed.     . furosemide (LASIX) 20 MG tablet Take 1 tablet (20 mg total) by mouth daily as needed. 30 tablet 1  . glipiZIDE (GLUCOTROL XL) 5 MG 24 hr tablet TAKE 1 TABLET BY MOUTH  EVERY DAY 90 tablet 3  . hydrochlorothiazide (HYDRODIURIL) 25 MG tablet TAKE 1 TABLET BY MOUTH EVERY DAY 90 tablet 1  . Lancets 30G MISC USE TWICE DAILY AS DIRECTED FOR GLUCOSE TESTING AND MONITORING 100 each 4  . Magnesium 250 MG TABS Take 1 tablet by mouth as needed.     . metFORMIN (GLUCOPHAGE) 1000 MG tablet TAKE 1 TABLET BY MOUTH TWICE A DAY WITH FOOD 180 tablet 1  . metoprolol succinate (TOPROL-XL) 50 MG 24 hr tablet TAKE 1 TABLET BY MOUTH IN THE MORNING 90 tablet 2  . Multiple Vitamin (MULTIVITAMIN) tablet Take 1 tablet by mouth daily.      Marland Kitchen ONE TOUCH LANCETS MISC Test twice daily. 200 each 5  . ONETOUCH VERIO test strip TEST TWICE A DAY 100 each 5  . pioglitazone (ACTOS) 45 MG tablet TAKE 1 TABLET BY MOUTH EVERY DAY 90 tablet 1  . pravastatin (PRAVACHOL) 40 MG tablet TAKE 1 TABLET BY MOUTH EVERY DAY 90 tablet 1  .  sitaGLIPtin (JANUVIA) 100 MG tablet Take 1 tablet (100 mg total) by mouth daily. 30 tablet 5  . triamcinolone cream (KENALOG) 0.1 % Apply 1 application topically 2 (two) times daily. 30 g 3   No current facility-administered medications on file prior to visit.     BP 122/68 (BP Location: Left Arm, Patient Position: Sitting, Cuff Size: Large)   Pulse 89   Temp 97.5 F (36.4 C) (Oral)   Wt 243 lb 3.2 oz (110.3 kg)   SpO2 97%   BMI 44.48 kg/m      Review of Systems  Constitutional: Negative.   Allergic/Immunologic: Negative.    Complaining of episodic upper lip swelling    Objective:   Physical Exam  Constitutional:  Blood pressure well controlled  HENT:  Mouth/Throat: Oropharynx is clear and moist.  No lip edema  Cardiovascular: Normal rate and regular rhythm.   Pulmonary/Chest: Effort normal and breath sounds normal.  Lymphadenopathy:    She has no cervical adenopathy.          Assessment & Plan:   History of upper lip edema.  Episodic.  Follow-up allergy medicine Diabetes mellitus.  Patient is scheduled for annual exam here later this month.  We'll reassess at that time  Nyoka Cowden, MD

## 2016-02-01 NOTE — Patient Instructions (Signed)
Limit your sodium (Salt) intake  Please check your blood pressure on a regular basis.  If it is consistently greater than 150/90, please make an office appointment.  Allergy clinic.  Follow-up as scheduled

## 2016-02-11 ENCOUNTER — Telehealth: Payer: Self-pay | Admitting: Internal Medicine

## 2016-02-11 NOTE — Telephone Encounter (Signed)
Pt need new Rx for Pioglitazone 45 mg  Pt would like to have a coupon so that she can get a discount due to it being so expensive for her and the insurance is not paying that much.     I have printed off coupons for patient and pt will call to different pharmacies to see which one would give her a discount with the discount drug coupon.

## 2016-02-12 ENCOUNTER — Other Ambulatory Visit: Payer: PPO

## 2016-02-13 NOTE — Telephone Encounter (Signed)
Noted  

## 2016-02-14 ENCOUNTER — Other Ambulatory Visit (INDEPENDENT_AMBULATORY_CARE_PROVIDER_SITE_OTHER): Payer: PPO

## 2016-02-14 DIAGNOSIS — Z0000 Encounter for general adult medical examination without abnormal findings: Secondary | ICD-10-CM

## 2016-02-14 LAB — HEPATIC FUNCTION PANEL
ALK PHOS: 82 U/L (ref 39–117)
ALT: 8 U/L (ref 0–35)
AST: 8 U/L (ref 0–37)
Albumin: 3.9 g/dL (ref 3.5–5.2)
BILIRUBIN DIRECT: 0.1 mg/dL (ref 0.0–0.3)
BILIRUBIN TOTAL: 0.4 mg/dL (ref 0.2–1.2)
TOTAL PROTEIN: 7.2 g/dL (ref 6.0–8.3)

## 2016-02-14 LAB — CBC WITH DIFFERENTIAL/PLATELET
BASOS PCT: 0.2 % (ref 0.0–3.0)
Basophils Absolute: 0 10*3/uL (ref 0.0–0.1)
EOS PCT: 2.6 % (ref 0.0–5.0)
Eosinophils Absolute: 0.2 10*3/uL (ref 0.0–0.7)
HEMATOCRIT: 30.8 % — AB (ref 36.0–46.0)
HEMOGLOBIN: 10.4 g/dL — AB (ref 12.0–15.0)
LYMPHS PCT: 23.8 % (ref 12.0–46.0)
Lymphs Abs: 1.7 10*3/uL (ref 0.7–4.0)
MCHC: 33.7 g/dL (ref 30.0–36.0)
MCV: 78.8 fl (ref 78.0–100.0)
MONOS PCT: 10.7 % (ref 3.0–12.0)
Monocytes Absolute: 0.7 10*3/uL (ref 0.1–1.0)
NEUTROS ABS: 4.4 10*3/uL (ref 1.4–7.7)
Neutrophils Relative %: 62.7 % (ref 43.0–77.0)
PLATELETS: 161 10*3/uL (ref 150.0–400.0)
RBC: 3.91 Mil/uL (ref 3.87–5.11)
RDW: 17.6 % — ABNORMAL HIGH (ref 11.5–15.5)
WBC: 7 10*3/uL (ref 4.0–10.5)

## 2016-02-14 LAB — LIPID PANEL
CHOL/HDL RATIO: 5
Cholesterol: 206 mg/dL — ABNORMAL HIGH (ref 0–200)
HDL: 43.4 mg/dL (ref >39.00)
LDL CALC: 133 mg/dL — AB (ref 0–99)
NONHDL: 162.89
Triglycerides: 147 mg/dL (ref 0.0–149.0)
VLDL: 29.4 mg/dL (ref 0.0–40.0)

## 2016-02-14 LAB — POC URINALSYSI DIPSTICK (AUTOMATED)
BILIRUBIN UA: NEGATIVE
GLUCOSE UA: NEGATIVE
Ketones, UA: NEGATIVE
Leukocytes, UA: NEGATIVE
NITRITE UA: NEGATIVE
Protein, UA: NEGATIVE
RBC UA: NEGATIVE
Spec Grav, UA: 1.02
Urobilinogen, UA: 0.2
pH, UA: 5

## 2016-02-14 LAB — BASIC METABOLIC PANEL
BUN: 28 mg/dL — ABNORMAL HIGH (ref 6–23)
CHLORIDE: 103 meq/L (ref 96–112)
CO2: 28 meq/L (ref 19–32)
Calcium: 9.3 mg/dL (ref 8.4–10.5)
Creatinine, Ser: 1.05 mg/dL (ref 0.40–1.20)
GFR: 65.88 mL/min (ref >60.00)
Glucose, Bld: 97 mg/dL (ref 70–99)
Potassium: 4.1 mEq/L (ref 3.5–5.1)
SODIUM: 140 meq/L (ref 135–145)

## 2016-02-14 LAB — HEMOGLOBIN A1C: HEMOGLOBIN A1C: 6.4 % (ref 4.6–6.5)

## 2016-02-14 LAB — MICROALBUMIN / CREATININE URINE RATIO
CREATININE, U: 182 mg/dL
MICROALB/CREAT RATIO: 0.4 mg/g (ref 0.0–30.0)
Microalb, Ur: <0.7 mg/dL (ref 0.0–1.9)

## 2016-02-14 LAB — TSH: TSH: 0.92 u[IU]/mL (ref 0.35–4.50)

## 2016-02-18 NOTE — Telephone Encounter (Signed)
Pt came up here and received the printed coupons.

## 2016-02-19 ENCOUNTER — Ambulatory Visit: Payer: PPO | Admitting: Internal Medicine

## 2016-02-19 NOTE — Telephone Encounter (Signed)
Noted  

## 2016-02-25 ENCOUNTER — Other Ambulatory Visit: Payer: Self-pay | Admitting: Internal Medicine

## 2016-02-26 ENCOUNTER — Encounter: Payer: Self-pay | Admitting: Internal Medicine

## 2016-02-26 ENCOUNTER — Ambulatory Visit (INDEPENDENT_AMBULATORY_CARE_PROVIDER_SITE_OTHER): Payer: PPO | Admitting: Internal Medicine

## 2016-02-26 VITALS — BP 130/64 | HR 83 | Temp 97.8°F | Ht 61.25 in | Wt 244.0 lb

## 2016-02-26 DIAGNOSIS — D649 Anemia, unspecified: Secondary | ICD-10-CM

## 2016-02-26 DIAGNOSIS — M150 Primary generalized (osteo)arthritis: Secondary | ICD-10-CM | POA: Diagnosis not present

## 2016-02-26 DIAGNOSIS — E785 Hyperlipidemia, unspecified: Secondary | ICD-10-CM

## 2016-02-26 DIAGNOSIS — Z0000 Encounter for general adult medical examination without abnormal findings: Secondary | ICD-10-CM | POA: Diagnosis not present

## 2016-02-26 DIAGNOSIS — E119 Type 2 diabetes mellitus without complications: Secondary | ICD-10-CM | POA: Diagnosis not present

## 2016-02-26 DIAGNOSIS — I10 Essential (primary) hypertension: Secondary | ICD-10-CM | POA: Diagnosis not present

## 2016-02-26 DIAGNOSIS — M159 Polyosteoarthritis, unspecified: Secondary | ICD-10-CM

## 2016-02-26 NOTE — Patient Instructions (Signed)
Limit your sodium (Salt) intake   Please check your hemoglobin A1c every 3-6  Months    It is important that you exercise regularly, at least 20 minutes 3 to 4 times per week.  If you develop chest pain or shortness of breath seek  medical attention.  You need to lose weight.  Consider a lower calorie diet and regular exercise.  

## 2016-02-26 NOTE — Progress Notes (Signed)
Pre visit review using our clinic review tool, if applicable. No additional management support is needed unless otherwise documented below in the visit note. 

## 2016-02-26 NOTE — Progress Notes (Signed)
Subjective:    Patient ID: Doris Jackson, female    DOB: May 25, 1941, 75 y.o.   MRN: [id number]  HPI  75 year old patient who is seen today for a preventive health examination  She has a history of type 2 diabetes and obesity.  Diabetes has been well-controlled with oral medications.  Last hemoglobin A1c 6.4  The patient has a history of a prior colonoscopy at age 89.  She has had a total abdominal hysterectomy.  She has had some mild anemia over the past several months. No recent eye exam, but she has had gynecologic evaluation as well as mammograms including ultrasound evaluation She has essential hypertension and exogenous obesity.  Patient states that she has been evaluated for OSA.  A number of years ago    Past Medical History:  Diagnosis Date  . ALLERGIC RHINITIS 06/17/2010  . ANEMIA-NOS 06/17/2010  . BREAST CANCER, HX OF 06/17/2010  . Cancer [facility]) 2008   right breast  . Cataract   . COPD 06/17/2010  . DIABETES MELLITUS, TYPE II 06/17/2010  . Endometriosis   . Fibroid    FIBROID  . HYPERLIPIDEMIA 06/17/2010  . HYPERTENSION 06/17/2010  . Leg swelling   . LOW BACK PAIN 06/17/2010  . OSTEOARTHRITIS 06/17/2010  . OSTEOPENIA 06/17/2010  . Weakness      Social History   Social History  . Marital status: Married    Spouse name: N/A  . Number of children: N/A  . Years of education: N/A   Occupational History  . Not on file.   Social History Main Topics  . Smoking status: Never Smoker  . Smokeless tobacco: Never Used  . Alcohol use No  . Drug use: No  . Sexual activity: No   Other Topics Concern  . Not on file   Social History Narrative  . No narrative on file    Past Surgical History:  Procedure Laterality Date  . ABDOMINAL HYSTERECTOMY  1988   TAH,LSO  . BREAST LUMPECTOMY  2007   LUMPECTOMY FOLLOWED BY RADIATION  . OOPHORECTOMY  1988   TAH,LSO  . TUBAL LIGATION      Family History  Problem Relation Age of Onset  . Diabetes Mother   .  Hypertension Sister   . Asthma Father   . Diabetes Brother     No Known Allergies  Current Outpatient Prescriptions on File Prior to Visit  Medication Sig Dispense Refill  . amLODipine-benazepril (LOTREL) 5-40 MG capsule TAKE ONE CAPSULE BY MOUTH EVERY DAY 30 capsule 5  . Ascorbic Acid (VITAMIN C) 1000 MG tablet Take 1,000 mg by mouth 2 (two) times daily.    . Azelastine-Fluticasone 137-50 MCG/ACT SUSP Place 1 spray into both nostrils daily. 1 Bottle 2  . benzonatate (TESSALON) 200 MG capsule TAKE ONE CAPSULE BY MOUTH TWICE A DAY AS NEEDED FOR COUGH 30 capsule 1  . Cholecalciferol (VITAMIN D) 2000 UNITS CAPS Take 1 capsule by mouth daily. Taking 2 caps daily 4,000    . cyanocobalamin 2000 MCG tablet Take 2,000 mcg by mouth daily.      . cyclobenzaprine (FLEXERIL) 10 MG tablet TAKE 1 TABLET BY MOUTH 3 TIMES A DAY AS NEEDED FOR MUSCLE SPASMS 30 tablet 5  . FERREX 150 150 MG capsule TAKE ONE CAPSULE BY MOUTH EVERY DAY 30 capsule 5  . fexofenadine (ALLEGRA) 180 MG tablet Take 180 mg by mouth daily as needed.     . furosemide (LASIX) 20 MG tablet Take 1 tablet (20 mg  total) by mouth daily as needed. 30 tablet 1  . glipiZIDE (GLUCOTROL XL) 5 MG 24 hr tablet TAKE 1 TABLET BY MOUTH EVERY DAY 90 tablet 3  . hydrochlorothiazide (HYDRODIURIL) 25 MG tablet TAKE 1 TABLET BY MOUTH EVERY DAY 90 tablet 1  . HYDROcodone-acetaminophen (NORCO/VICODIN) 5-325 MG tablet Take 1 tablet by mouth every 6 (six) hours as needed. 60 tablet 0  . Lancets 30G MISC USE TWICE DAILY AS DIRECTED FOR GLUCOSE TESTING AND MONITORING 100 each 4  . Magnesium 250 MG TABS Take 1 tablet by mouth as needed.     . metFORMIN (GLUCOPHAGE) 1000 MG tablet TAKE 1 TABLET BY MOUTH TWICE A DAY WITH FOOD 180 tablet 1  . metoprolol succinate (TOPROL-XL) 50 MG 24 hr tablet TAKE 1 TABLET BY MOUTH IN THE MORNING 90 tablet 2  . Multiple Vitamin (MULTIVITAMIN) tablet Take 1 tablet by mouth daily.      Marland Kitchen ONE TOUCH LANCETS MISC Test twice daily. 200  each 5  . ONETOUCH VERIO test strip TEST TWICE A DAY 100 each 5  . pioglitazone (ACTOS) 45 MG tablet TAKE 1 TABLET BY MOUTH EVERY DAY 90 tablet 1  . pravastatin (PRAVACHOL) 40 MG tablet TAKE 1 TABLET BY MOUTH EVERY DAY 90 tablet 1  . sitaGLIPtin (JANUVIA) 100 MG tablet Take 1 tablet (100 mg total) by mouth daily. 30 tablet 5  . triamcinolone cream (KENALOG) 0.1 % Apply 1 application topically 2 (two) times daily. 30 g 3   No current facility-administered medications on file prior to visit.     BP 130/64 (BP Location: Left Arm, Patient Position: Sitting, Cuff Size: Large)   Pulse 83   Temp 97.8 F (36.6 C) (Oral)   Ht 5' 1.25" (1.556 m)   Wt 244 lb (110.7 kg)   SpO2 95%   BMI 45.73 kg/m   Medicare wellness exam  1. Risk factors, based on past  M,S,F history.  Risk factors include diabetes hypertension and dyslipidemia  2.  Physical activities: Fairly sedentary due to obesity and unsteady gait  3.  Depression/mood: No history of major depression or mood disorder  4.  Hearing: No deficits  5.  ADL's: Independent in aspects of daily living.  Still works part-time  6.  Fall risk: Moderate  7.  Home safety: No problems identified  8.  Height weight, and visual acuity; height and weight stable no change in visual acuity  9.  Counseling: Graded exercise program and weight loss encouraged.  Heart healthy diet recommended  10. Lab orders based on risk factors: Laboratory profile including lipid profile, hemoglobin A1c and urine for microalbumin reviewed  11. Referral : Not appropriate at this time except for ophthalmology referral.  Needs follow-up colonoscopy  12. Care plan: Continue efforts at aggressive risk factor modification  13. Cognitive assessment: Alert and oriented with normal affect no cognitive dysfunction  14. Screening: Patient provided with a written and personalized 5-10 year screening schedule in the AVS.    15. Provider List Update: Includes primary care  OB/GYN, ophthalmology and GI   Review of Systems  Constitutional: Negative for appetite change, fatigue, fever and unexpected weight change.  HENT: Negative for congestion, dental problem, ear pain, hearing loss, mouth sores, nosebleeds, sinus pressure, sore throat, tinnitus, trouble swallowing and voice change.   Eyes: Negative for photophobia, pain, redness and visual disturbance.  Respiratory: Negative for cough, chest tightness and shortness of breath.   Cardiovascular: Negative for chest pain, palpitations and leg swelling.  Gastrointestinal: Negative for abdominal distention, abdominal pain, blood in stool, constipation, diarrhea, nausea, rectal pain and vomiting.  Genitourinary: Negative for difficulty urinating, dysuria, flank pain, frequency, genital sores, hematuria, menstrual problem, pelvic pain, urgency, vaginal bleeding, vaginal discharge and vaginal pain.  Musculoskeletal: Positive for gait problem. Negative for arthralgias, back pain and neck stiffness.  Skin: Negative for rash.  Neurological: Negative for dizziness, syncope, speech difficulty, weakness, light-headedness, numbness and headaches.  Hematological: Negative for adenopathy. Does not bruise/bleed easily.  Psychiatric/Behavioral: Negative for agitation, behavioral problems, dysphoric mood, self-injury and suicidal ideas. The patient is not nervous/anxious.        Objective:   Physical Exam  Constitutional: She is oriented to person, place, and time. She appears well-developed and well-nourished.  Obese Blood pressure 130/64  HENT:  Head: Normocephalic and atraumatic.  Right Ear: External ear normal.  Left Ear: External ear normal.  Mouth/Throat: Oropharynx is clear and moist.  Low hanging soft palate  Eyes: Conjunctivae and EOM are normal.  Neck: Normal range of motion. Neck supple. No JVD present. No thyromegaly present.  Cardiovascular: Normal rate, regular rhythm, normal heart sounds and intact distal  pulses.   No murmur heard. Pulmonary/Chest: Effort normal and breath sounds normal. She has no wheezes. She has no rales.  Status post lumpectomy right lateral breast Bilateral nodularity  Abdominal: Soft. Bowel sounds are normal. She exhibits no distension and no mass. There is no tenderness. There is no rebound and no guarding.  Lower midline scar  Musculoskeletal: Normal range of motion. She exhibits edema. She exhibits no tenderness.  Neurological: She is alert and oriented to person, place, and time. She has normal reflexes. No cranial nerve deficit. She exhibits normal muscle tone. Coordination normal.  Skin: Skin is warm and dry. No rash noted.  Psychiatric: She has a normal mood and affect. Her behavior is normal.          Assessment & Plan:   Preventive health examination Diabetes mellitus.  Well-controlled Exogenous obesity.  Weight loss encouraged Essential hypertension, stable History of right breast cancer Mild anemia.  Will set up for colonoscopy  Follow-up 3 months  Myrle Dues Pilar Plate

## 2016-03-05 ENCOUNTER — Telehealth: Payer: Self-pay | Admitting: Internal Medicine

## 2016-03-05 MED ORDER — HYDROCODONE-ACETAMINOPHEN 5-325 MG PO TABS: 1.0000 | ORAL_TABLET | Freq: Four times a day (QID) | ORAL | 0 refills | Status: DC | PRN

## 2016-03-05 NOTE — Telephone Encounter (Signed)
Pt notified Rx ready for pickup. Rx printed and signed.  

## 2016-03-05 NOTE — Telephone Encounter (Signed)
Pt request refill  °HYDROcodone-acetaminophen (NORCO/VICODIN) 5-325 MG tablet °

## 2016-03-17 ENCOUNTER — Other Ambulatory Visit: Payer: Self-pay | Admitting: *Deleted

## 2016-03-17 MED ORDER — METOPROLOL SUCCINATE ER 50 MG PO TB24: 50.0000 mg | ORAL_TABLET | Freq: Every morning | ORAL | 3 refills | Status: DC

## 2016-03-28 ENCOUNTER — Other Ambulatory Visit: Payer: Self-pay | Admitting: Family Medicine

## 2016-04-04 ENCOUNTER — Other Ambulatory Visit: Payer: Self-pay | Admitting: Internal Medicine

## 2016-04-04 NOTE — Telephone Encounter (Signed)
Pt need new Rx for Hydrocodone °

## 2016-04-04 NOTE — Telephone Encounter (Signed)
Rx request for Norco/vicodin 5-325 mg   Last OV 02/01/2016 Last Refilled 03/05/2016 with 0 refills

## 2016-04-11 MED ORDER — HYDROCODONE-ACETAMINOPHEN 5-325 MG PO TABS: 1.0000 | ORAL_TABLET | Freq: Four times a day (QID) | ORAL | 0 refills | Status: DC | PRN

## 2016-04-11 NOTE — Telephone Encounter (Signed)
Rx printed and upfront ready for patient to pick up. Patient is aware.

## 2016-04-11 NOTE — Telephone Encounter (Signed)
Pt calling in to check the status and I spoke with Sharyn Lull and she stated that she will be working on it an calling the patient back today.

## 2016-04-16 ENCOUNTER — Ambulatory Visit (INDEPENDENT_AMBULATORY_CARE_PROVIDER_SITE_OTHER): Payer: PPO

## 2016-04-16 DIAGNOSIS — Z23 Encounter for immunization: Secondary | ICD-10-CM

## 2016-04-24 ENCOUNTER — Other Ambulatory Visit: Payer: Self-pay | Admitting: Internal Medicine

## 2016-04-26 ENCOUNTER — Other Ambulatory Visit: Payer: Self-pay | Admitting: Internal Medicine

## 2016-05-05 ENCOUNTER — Telehealth: Payer: Self-pay | Admitting: Internal Medicine

## 2016-05-05 NOTE — Telephone Encounter (Signed)
Pt need new Rx for Hydrocodone   Pt is aware that Dr. Raliegh Ip is out of the office and will return on Wednesday and also of the three day refill protocol.

## 2016-05-07 MED ORDER — HYDROCODONE-ACETAMINOPHEN 5-325 MG PO TABS: 1.0000 | ORAL_TABLET | Freq: Four times a day (QID) | ORAL | 0 refills | Status: DC | PRN

## 2016-05-07 NOTE — Telephone Encounter (Signed)
Pt notified Rx ready for pickup. Rx printed and signed.  

## 2016-06-05 ENCOUNTER — Telehealth: Payer: Self-pay | Admitting: Internal Medicine

## 2016-06-05 LAB — HM DIABETES EYE EXAM

## 2016-06-05 NOTE — Telephone Encounter (Signed)
° ° ° ° ° °  Pt request refill of the following: ° °HYDROcodone-acetaminophen (NORCO/VICODIN) 5-325 MG tablet ° ° °Phamacy: °

## 2016-06-06 MED ORDER — HYDROCODONE-ACETAMINOPHEN 5-325 MG PO TABS: 1.0000 | ORAL_TABLET | Freq: Four times a day (QID) | ORAL | 0 refills | Status: DC | PRN

## 2016-06-06 NOTE — Telephone Encounter (Signed)
Pt notified Rx ready for pickup. Rx printed and signed.  

## 2016-07-07 ENCOUNTER — Telehealth: Payer: Self-pay | Admitting: Internal Medicine

## 2016-07-07 NOTE — Telephone Encounter (Signed)
Pt need new Rx for Hydrocodone  Pt is aware of the 3 business day for refills.

## 2016-07-08 ENCOUNTER — Other Ambulatory Visit: Payer: Self-pay | Admitting: Internal Medicine

## 2016-07-08 MED ORDER — HYDROCODONE-ACETAMINOPHEN 5-325 MG PO TABS: 1.0000 | ORAL_TABLET | Freq: Four times a day (QID) | ORAL | 0 refills | Status: DC | PRN

## 2016-07-08 NOTE — Telephone Encounter (Signed)
Pt notified Rx ready for pickup. Rx printed and signed.  

## 2016-07-09 ENCOUNTER — Other Ambulatory Visit: Payer: Self-pay | Admitting: Internal Medicine

## 2016-07-12 ENCOUNTER — Other Ambulatory Visit: Payer: Self-pay | Admitting: Internal Medicine

## 2016-07-14 DIAGNOSIS — R69 Illness, unspecified: Secondary | ICD-10-CM | POA: Diagnosis not present

## 2016-07-18 DIAGNOSIS — J3089 Other allergic rhinitis: Secondary | ICD-10-CM | POA: Diagnosis not present

## 2016-07-18 DIAGNOSIS — J301 Allergic rhinitis due to pollen: Secondary | ICD-10-CM | POA: Diagnosis not present

## 2016-07-20 ENCOUNTER — Other Ambulatory Visit: Payer: Self-pay | Admitting: Internal Medicine

## 2016-07-27 ENCOUNTER — Other Ambulatory Visit: Payer: Self-pay | Admitting: Internal Medicine

## 2016-08-04 ENCOUNTER — Telehealth: Payer: Self-pay | Admitting: Internal Medicine

## 2016-08-04 ENCOUNTER — Other Ambulatory Visit: Payer: Self-pay | Admitting: Internal Medicine

## 2016-08-04 NOTE — Telephone Encounter (Signed)
Pt need new Rx for Hydrocodone pt is aware of 3 business days for refills.               AND  Pt need new Rx for JANUVIA  Pharm:  [facility]

## 2016-08-05 ENCOUNTER — Other Ambulatory Visit: Payer: Self-pay | Admitting: Internal Medicine

## 2016-08-05 MED ORDER — SITAGLIPTIN PHOSPHATE 100 MG PO TABS: 100.0000 mg | ORAL_TABLET | Freq: Every day | ORAL | 1 refills | Status: DC

## 2016-08-05 NOTE — Telephone Encounter (Signed)
Rx for Hydrocodone will be printed on 08/08/16, too early today.

## 2016-08-07 DIAGNOSIS — J301 Allergic rhinitis due to pollen: Secondary | ICD-10-CM | POA: Diagnosis not present

## 2016-08-07 DIAGNOSIS — J3089 Other allergic rhinitis: Secondary | ICD-10-CM | POA: Diagnosis not present

## 2016-08-07 MED ORDER — HYDROCODONE-ACETAMINOPHEN 5-325 MG PO TABS: 1.0000 | ORAL_TABLET | Freq: Four times a day (QID) | ORAL | 0 refills | Status: DC | PRN

## 2016-08-07 NOTE — Telephone Encounter (Signed)
Pt notified Rx ready for pickup. Rx printed and signed.  

## 2016-08-07 NOTE — Addendum Note (Signed)
Addended by: Abelardo Diesel on: 08/07/2016 08:37 AM   Modules accepted: Orders

## 2016-08-21 ENCOUNTER — Encounter: Payer: Self-pay | Admitting: Oncology

## 2016-08-21 DIAGNOSIS — M810 Age-related osteoporosis without current pathological fracture: Secondary | ICD-10-CM | POA: Diagnosis not present

## 2016-08-21 DIAGNOSIS — M8589 Other specified disorders of bone density and structure, multiple sites: Secondary | ICD-10-CM | POA: Diagnosis not present

## 2016-08-21 DIAGNOSIS — Z1231 Encounter for screening mammogram for malignant neoplasm of breast: Secondary | ICD-10-CM | POA: Diagnosis not present

## 2016-08-21 DIAGNOSIS — Z853 Personal history of malignant neoplasm of breast: Secondary | ICD-10-CM | POA: Diagnosis not present

## 2016-08-21 LAB — HM MAMMOGRAPHY

## 2016-08-26 ENCOUNTER — Encounter: Payer: Self-pay | Admitting: Internal Medicine

## 2016-08-26 DIAGNOSIS — J301 Allergic rhinitis due to pollen: Secondary | ICD-10-CM | POA: Diagnosis not present

## 2016-08-26 DIAGNOSIS — J3089 Other allergic rhinitis: Secondary | ICD-10-CM | POA: Diagnosis not present

## 2016-08-29 DIAGNOSIS — R69 Illness, unspecified: Secondary | ICD-10-CM | POA: Diagnosis not present

## 2016-09-09 ENCOUNTER — Telehealth: Payer: Self-pay | Admitting: Internal Medicine

## 2016-09-09 MED ORDER — HYDROCODONE-ACETAMINOPHEN 5-325 MG PO TABS: 1.0000 | ORAL_TABLET | Freq: Four times a day (QID) | ORAL | 0 refills | Status: DC | PRN

## 2016-09-09 NOTE — Telephone Encounter (Signed)
Rx printed awaiting to be signed.  

## 2016-09-09 NOTE — Telephone Encounter (Signed)
Pt request refill  °HYDROcodone-acetaminophen (NORCO/VICODIN) 5-325 MG tablet °

## 2016-09-10 ENCOUNTER — Other Ambulatory Visit: Payer: Self-pay | Admitting: Internal Medicine

## 2016-09-10 MED ORDER — PIOGLITAZONE HCL 45 MG PO TABS: 45.0000 mg | ORAL_TABLET | Freq: Every day | ORAL | 2 refills | Status: DC

## 2016-09-10 NOTE — Telephone Encounter (Signed)
Pt notified Rx ready for pickup. Rx printed and signed.  

## 2016-09-11 ENCOUNTER — Ambulatory Visit (INDEPENDENT_AMBULATORY_CARE_PROVIDER_SITE_OTHER): Payer: Medicare HMO | Admitting: Family Medicine

## 2016-09-11 ENCOUNTER — Encounter: Payer: Self-pay | Admitting: Family Medicine

## 2016-09-11 VITALS — BP 132/80 | HR 102 | Temp 97.4°F | Ht 61.25 in | Wt 246.8 lb

## 2016-09-11 DIAGNOSIS — K5900 Constipation, unspecified: Secondary | ICD-10-CM | POA: Diagnosis not present

## 2016-09-11 DIAGNOSIS — R300 Dysuria: Secondary | ICD-10-CM | POA: Diagnosis not present

## 2016-09-11 LAB — POCT URINALYSIS DIPSTICK
Bilirubin, UA: NEGATIVE
Glucose, UA: NEGATIVE
Ketones, UA: NEGATIVE
NITRITE UA: NEGATIVE
PH UA: 5
Protein, UA: NEGATIVE
RBC UA: NEGATIVE
SPEC GRAV UA: 1.02
UROBILINOGEN UA: 0.2

## 2016-09-11 NOTE — Patient Instructions (Addendum)
I hope you are feeling better soon! Seek care immediately if worsening, new concerns or you are not improving with treatment.  For the constipation: Ensure fiber supplement daily to keep stools soft and regular. Mirilax 1-2 times daily for 3-5 days as needed if you are stopped up.

## 2016-09-11 NOTE — Progress Notes (Signed)
Pre visit review using our clinic review tool, if applicable. No additional management support is needed unless otherwise documented below in the visit note. 

## 2016-09-11 NOTE — Progress Notes (Addendum)
HPI:   Acute visit for Dysuria: -mild, intermittent x 2 weeks -increased frequency, urgency and mild burning with urination last week - resolve with drinking water, now symptoms improving -no fevers, malaise, nausea, vaginal symptoms, abd/pelvic or flank pain, hematuria -reports her diabetes is well controlled -chronic constipation reported, on opoid pane medications and iron  ROS: See pertinent positives and negatives per HPI.  Past Medical History:  Diagnosis Date  . ALLERGIC RHINITIS 06/17/2010  . ANEMIA-NOS 06/17/2010  . BREAST CANCER, HX OF 06/17/2010  . Cancer [facility]) 2008   right breast  . Cataract   . COPD 06/17/2010  . DIABETES MELLITUS, TYPE II 06/17/2010  . Endometriosis   . Fibroid    FIBROID  . HYPERLIPIDEMIA 06/17/2010  . HYPERTENSION 06/17/2010  . Leg swelling   . LOW BACK PAIN 06/17/2010  . OSTEOARTHRITIS 06/17/2010  . OSTEOPENIA 06/17/2010  . Weakness     Past Surgical History:  Procedure Laterality Date  . ABDOMINAL HYSTERECTOMY  1988   TAH,LSO  . BREAST LUMPECTOMY  2007   LUMPECTOMY FOLLOWED BY RADIATION  . OOPHORECTOMY  1988   TAH,LSO  . TUBAL LIGATION      Family History  Problem Relation Age of Onset  . Diabetes Mother   . Hypertension Sister   . Asthma Father   . Diabetes Brother     Social History   Social History  . Marital status: Married    Spouse name: N/A  . Number of children: N/A  . Years of education: N/A   Social History Main Topics  . Smoking status: Never Smoker  . Smokeless tobacco: Never Used  . Alcohol use No  . Drug use: No  . Sexual activity: No   Other Topics Concern  . None   Social History Narrative  . None     Current Outpatient Prescriptions:  .  amLODipine-benazepril (LOTREL) 5-40 MG capsule, TAKE ONE CAPSULE BY MOUTH EVERY DAY, Disp: 30 capsule, Rfl: 5 .  Ascorbic Acid (VITAMIN C) 1000 MG tablet, Take 1,000 mg by mouth 2 (two) times daily., Disp: , Rfl:  .  Azelastine-Fluticasone 137-50  MCG/ACT SUSP, Place 1 spray into both nostrils daily., Disp: 1 Bottle, Rfl: 2 .  benzonatate (TESSALON) 200 MG capsule, TAKE ONE CAPSULE BY MOUTH TWICE A DAY AS NEEDED FOR COUGH, Disp: 30 capsule, Rfl: 1 .  Cholecalciferol (VITAMIN D) 2000 UNITS CAPS, Take 1 capsule by mouth daily. Taking 2 caps daily 4,000, Disp: , Rfl:  .  cyanocobalamin 2000 MCG tablet, Take 2,000 mcg by mouth daily.  , Disp: , Rfl:  .  cyclobenzaprine (FLEXERIL) 10 MG tablet, TAKE 1 TABLET BY MOUTH 3 TIMES A DAY AS NEEDED FOR MUSCLE SPASMS, Disp: 30 tablet, Rfl: 5 .  FERREX 150 150 MG capsule, TAKE ONE CAPSULE BY MOUTH EVERY DAY, Disp: 30 capsule, Rfl: 5 .  fexofenadine (ALLEGRA) 180 MG tablet, Take 180 mg by mouth daily as needed. , Disp: , Rfl:  .  furosemide (LASIX) 20 MG tablet, Take 1 tablet (20 mg total) by mouth daily as needed., Disp: 30 tablet, Rfl: 1 .  glipiZIDE (GLUCOTROL XL) 5 MG 24 hr tablet, TAKE 1 TABLET BY MOUTH EVERY DAY, Disp: 90 tablet, Rfl: 3 .  glipiZIDE (GLUCOTROL XL) 5 MG 24 hr tablet, TAKE 1 TABLET BY MOUTH EVERY DAY, Disp: 90 tablet, Rfl: 3 .  hydrochlorothiazide (HYDRODIURIL) 25 MG tablet, TAKE 1 TABLET BY MOUTH EVERY DAY, Disp: 90 tablet, Rfl: 1 .  HYDROcodone-acetaminophen (NORCO/VICODIN) 5-325  MG tablet, Take 1 tablet by mouth every 6 (six) hours as needed., Disp: 60 tablet, Rfl: 0 .  Lancets 30G MISC, USE TWICE DAILY AS DIRECTED FOR GLUCOSE TESTING AND MONITORING, Disp: 100 each, Rfl: 4 .  Magnesium 250 MG TABS, Take 1 tablet by mouth as needed. , Disp: , Rfl:  .  metFORMIN (GLUCOPHAGE) 1000 MG tablet, TAKE 1 TABLET BY MOUTH TWICE A DAY WITH FOOD, Disp: 180 tablet, Rfl: 1 .  metoprolol succinate (TOPROL-XL) 50 MG 24 hr tablet, Take 1 tablet (50 mg total) by mouth every morning. Take with or immediately following a meal., Disp: 90 tablet, Rfl: 3 .  Multiple Vitamin (MULTIVITAMIN) tablet, Take 1 tablet by mouth daily.  , Disp: , Rfl:  .  ONE TOUCH LANCETS MISC, Test twice daily., Disp: 200 each, Rfl:  5 .  ONETOUCH VERIO test strip, TEST TWICE A DAY, Disp: 100 each, Rfl: 5 .  pioglitazone (ACTOS) 45 MG tablet, Take 1 tablet (45 mg total) by mouth daily., Disp: 90 tablet, Rfl: 2 .  pravastatin (PRAVACHOL) 40 MG tablet, TAKE 1 TABLET BY MOUTH EVERY DAY, Disp: 90 tablet, Rfl: 0 .  sitaGLIPtin (JANUVIA) 100 MG tablet, Take 1 tablet (100 mg total) by mouth daily., Disp: 30 tablet, Rfl: 5 .  sitaGLIPtin (JANUVIA) 100 MG tablet, Take 1 tablet (100 mg total) by mouth daily., Disp: 90 tablet, Rfl: 1 .  triamcinolone cream (KENALOG) 0.1 %, Apply 1 application topically 2 (two) times daily., Disp: 30 g, Rfl: 3  EXAM:  Vitals:   09/11/16 1118  BP: 132/80  Pulse: (!) 102  Temp: 97.4 F (36.3 C)    Body mass index is 46.25 kg/m.  GENERAL: vitals reviewed and listed above, alert, oriented, appears well hydrated and in no acute distress  HEENT: atraumatic, conjunttiva clear, no obvious abnormalities on inspection of external nose and ears  CV: HRRR  ABD: BS+ , soft, NTTP, no CVA TTP  MS: moves all extremities without noticeable abnormality  PSYCH: pleasant and cooperative, no obvious depression or anxiety  ASSESSMENT AND PLAN:  Discussed the following assessment and plan:  Dysuria - Plan: POC Urinalysis Dipstick, Culture, Urine  Constipation, unspecified constipation type  -udip with leuks only -opted for culture and return precautions in interim if any worsening rather then continuing to improve -bowel regimen advised for the constipation with f/u with PCP if not improving -Patient advised to return or notify a doctor immediately if symptoms worsen or persist or new concerns arise.  Patient Instructions  I hope you are feeling better soon! Seek care immediately if worsening, new concerns or you are not improving with treatment.  For the constipation: Ensure fiber supplement daily to keep stools soft and regular. Mirilax 1-2 times daily for 3-5 days as needed if you are stopped  up.     Colin Benton R., DO

## 2016-09-17 DIAGNOSIS — J3089 Other allergic rhinitis: Secondary | ICD-10-CM | POA: Diagnosis not present

## 2016-09-17 DIAGNOSIS — J301 Allergic rhinitis due to pollen: Secondary | ICD-10-CM | POA: Diagnosis not present

## 2016-09-23 ENCOUNTER — Other Ambulatory Visit: Payer: Self-pay | Admitting: Internal Medicine

## 2016-09-23 MED ORDER — PRAVASTATIN SODIUM 40 MG PO TABS: 40.0000 mg | ORAL_TABLET | Freq: Every day | ORAL | 1 refills | Status: DC

## 2016-09-29 ENCOUNTER — Other Ambulatory Visit: Payer: Self-pay | Admitting: Internal Medicine

## 2016-10-02 DIAGNOSIS — R69 Illness, unspecified: Secondary | ICD-10-CM | POA: Diagnosis not present

## 2016-10-06 ENCOUNTER — Telehealth: Payer: Self-pay | Admitting: Internal Medicine

## 2016-10-06 NOTE — Telephone Encounter (Signed)
Medication last refilled on 09/09/2016. Too Early Rx must last 30 days. Will print on 10/10/16

## 2016-10-06 NOTE — Telephone Encounter (Signed)
Pt request refill  °HYDROcodone-acetaminophen (NORCO/VICODIN) 5-325 MG tablet °

## 2016-10-07 DIAGNOSIS — J3089 Other allergic rhinitis: Secondary | ICD-10-CM | POA: Diagnosis not present

## 2016-10-07 DIAGNOSIS — J301 Allergic rhinitis due to pollen: Secondary | ICD-10-CM | POA: Diagnosis not present

## 2016-10-09 ENCOUNTER — Other Ambulatory Visit: Payer: Self-pay | Admitting: Internal Medicine

## 2016-10-09 ENCOUNTER — Telehealth: Payer: Self-pay

## 2016-10-09 NOTE — Telephone Encounter (Signed)
Received PA request from [facility] pharmacy for Cyclobenzaprine. PA submitted & is pending. Key: [id number]

## 2016-10-10 ENCOUNTER — Telehealth: Payer: Self-pay | Admitting: Internal Medicine

## 2016-10-10 MED ORDER — HYDROCODONE-ACETAMINOPHEN 5-325 MG PO TABS: 1.0000 | ORAL_TABLET | Freq: Four times a day (QID) | ORAL | 0 refills | Status: DC | PRN

## 2016-10-10 NOTE — Telephone Encounter (Signed)
Rx printed awaiting to be signed.  

## 2016-10-10 NOTE — Telephone Encounter (Signed)
Error

## 2016-10-10 NOTE — Telephone Encounter (Signed)
PA approved, form faxed back to pharmacy. 

## 2016-10-10 NOTE — Telephone Encounter (Signed)
Pt calling to see if the Rx will be ready today.

## 2016-10-10 NOTE — Telephone Encounter (Signed)
Pt notified Rx ready for pickup. Rx printed and signed.  

## 2016-10-21 DIAGNOSIS — Z0000 Encounter for general adult medical examination without abnormal findings: Secondary | ICD-10-CM | POA: Diagnosis not present

## 2016-10-21 DIAGNOSIS — E1142 Type 2 diabetes mellitus with diabetic polyneuropathy: Secondary | ICD-10-CM | POA: Diagnosis not present

## 2016-10-21 DIAGNOSIS — E7800 Pure hypercholesterolemia, unspecified: Secondary | ICD-10-CM | POA: Diagnosis not present

## 2016-10-21 DIAGNOSIS — I10 Essential (primary) hypertension: Secondary | ICD-10-CM | POA: Diagnosis not present

## 2016-10-21 DIAGNOSIS — M545 Low back pain: Secondary | ICD-10-CM | POA: Diagnosis not present

## 2016-10-21 DIAGNOSIS — Z6841 Body Mass Index (BMI) 40.0 and over, adult: Secondary | ICD-10-CM | POA: Diagnosis not present

## 2016-10-21 DIAGNOSIS — R609 Edema, unspecified: Secondary | ICD-10-CM | POA: Diagnosis not present

## 2016-10-21 DIAGNOSIS — Z7984 Long term (current) use of oral hypoglycemic drugs: Secondary | ICD-10-CM | POA: Diagnosis not present

## 2016-10-21 DIAGNOSIS — D509 Iron deficiency anemia, unspecified: Secondary | ICD-10-CM | POA: Diagnosis not present

## 2016-10-27 DIAGNOSIS — J3089 Other allergic rhinitis: Secondary | ICD-10-CM | POA: Diagnosis not present

## 2016-10-27 DIAGNOSIS — J301 Allergic rhinitis due to pollen: Secondary | ICD-10-CM | POA: Diagnosis not present

## 2016-10-28 ENCOUNTER — Other Ambulatory Visit: Payer: Self-pay | Admitting: Internal Medicine

## 2016-11-11 ENCOUNTER — Telehealth: Payer: Self-pay | Admitting: Internal Medicine

## 2016-11-11 NOTE — Telephone Encounter (Signed)
This medication was last refilled 10/10/16 for #60 with 0 refills. Is this ok to refill?

## 2016-11-11 NOTE — Telephone Encounter (Signed)
° ° ° ° ° °  Pt request refill of the following: ° °HYDROcodone-acetaminophen (NORCO/VICODIN) 5-325 MG tablet ° ° °Phamacy: °

## 2016-11-11 NOTE — Telephone Encounter (Signed)
Okay for refill?  

## 2016-11-12 MED ORDER — HYDROCODONE-ACETAMINOPHEN 5-325 MG PO TABS: 1.0000 | ORAL_TABLET | Freq: Four times a day (QID) | ORAL | 0 refills | Status: DC | PRN

## 2016-11-12 NOTE — Telephone Encounter (Signed)
Pt notified via a detailed voice message that Rx ready for pickup. Rx printed and signed.

## 2016-11-12 NOTE — Telephone Encounter (Signed)
Rx is printed and awaiting to be signed

## 2016-11-17 DIAGNOSIS — J301 Allergic rhinitis due to pollen: Secondary | ICD-10-CM | POA: Diagnosis not present

## 2016-11-17 DIAGNOSIS — J3089 Other allergic rhinitis: Secondary | ICD-10-CM | POA: Diagnosis not present

## 2016-11-18 DIAGNOSIS — J3089 Other allergic rhinitis: Secondary | ICD-10-CM | POA: Diagnosis not present

## 2016-11-18 DIAGNOSIS — J301 Allergic rhinitis due to pollen: Secondary | ICD-10-CM | POA: Diagnosis not present

## 2016-12-08 DIAGNOSIS — J301 Allergic rhinitis due to pollen: Secondary | ICD-10-CM | POA: Diagnosis not present

## 2016-12-08 DIAGNOSIS — J3089 Other allergic rhinitis: Secondary | ICD-10-CM | POA: Diagnosis not present

## 2016-12-10 DIAGNOSIS — J3089 Other allergic rhinitis: Secondary | ICD-10-CM | POA: Diagnosis not present

## 2016-12-10 DIAGNOSIS — R69 Illness, unspecified: Secondary | ICD-10-CM | POA: Diagnosis not present

## 2016-12-10 DIAGNOSIS — T781XXA Other adverse food reactions, not elsewhere classified, initial encounter: Secondary | ICD-10-CM | POA: Diagnosis not present

## 2016-12-15 ENCOUNTER — Telehealth: Payer: Self-pay | Admitting: Internal Medicine

## 2016-12-15 DIAGNOSIS — J3089 Other allergic rhinitis: Secondary | ICD-10-CM | POA: Diagnosis not present

## 2016-12-15 DIAGNOSIS — J301 Allergic rhinitis due to pollen: Secondary | ICD-10-CM | POA: Diagnosis not present

## 2016-12-15 MED ORDER — HYDROCODONE-ACETAMINOPHEN 5-325 MG PO TABS: 1.0000 | ORAL_TABLET | Freq: Four times a day (QID) | ORAL | 0 refills | Status: DC | PRN

## 2016-12-15 NOTE — Telephone Encounter (Signed)
Pt needs new rx hydrocodone °

## 2016-12-15 NOTE — Telephone Encounter (Signed)
Rx printed awaiting to be signed.  

## 2016-12-16 ENCOUNTER — Ambulatory Visit: Payer: Medicare HMO | Admitting: Internal Medicine

## 2016-12-16 NOTE — Telephone Encounter (Signed)
Pt notified Rx ready for pickup. Rx printed and signed.  

## 2016-12-17 ENCOUNTER — Encounter: Payer: Self-pay | Admitting: Family Medicine

## 2016-12-17 ENCOUNTER — Ambulatory Visit (INDEPENDENT_AMBULATORY_CARE_PROVIDER_SITE_OTHER): Payer: Medicare HMO | Admitting: Family Medicine

## 2016-12-17 VITALS — BP 140/80 | HR 100 | Resp 12 | Ht 61.25 in | Wt 239.2 lb

## 2016-12-17 DIAGNOSIS — M25512 Pain in left shoulder: Secondary | ICD-10-CM

## 2016-12-17 DIAGNOSIS — M542 Cervicalgia: Secondary | ICD-10-CM | POA: Diagnosis not present

## 2016-12-17 DIAGNOSIS — J301 Allergic rhinitis due to pollen: Secondary | ICD-10-CM | POA: Diagnosis not present

## 2016-12-17 DIAGNOSIS — J3089 Other allergic rhinitis: Secondary | ICD-10-CM | POA: Diagnosis not present

## 2016-12-17 DIAGNOSIS — W109XXA Fall (on) (from) unspecified stairs and steps, initial encounter: Secondary | ICD-10-CM | POA: Diagnosis not present

## 2016-12-17 DIAGNOSIS — T148XXA Other injury of unspecified body region, initial encounter: Secondary | ICD-10-CM | POA: Diagnosis not present

## 2016-12-17 MED ORDER — DICLOFENAC SODIUM 1 % TD GEL: 4.0000 g | Freq: Four times a day (QID) | TRANSDERMAL | 0 refills | Status: AC

## 2016-12-17 NOTE — Progress Notes (Signed)
HPI:   ACUTE VISIT:  Chief Complaint  Patient presents with  . left shoulder pain    [name] is a 76 y.o. female, who is here today complaining of left shoulder and cervical pain, which she attributes to recent fall. She has Hx of generalized OA and has had shoulder pain as well as upper back pain intermittently for years.   Left shoulder pain started about 2 days after fall. She was going upstairs, 3 small ones, missed the last one and did slid. She landed on her abdomen and face. She also hit the "top" of her head against bottom of a glass door.  She states that she felt "fine", she denies LOC, she was able to get up and continue with her routine activities.  + Limitation of ROM.  Pain seems to be worse at night. She reports frequent falls in the past 3 months.  She takes Hydrocodone-Acetaminophen 5-325 mg with her other meds in the morning. Also takes Flexeril as needed, states that it causes drowsiness, so tries to take it at night. She has Hx of DM II and currently on Glipizide XL,Januvia,and Metformin. HTN on Metoprolol Succinate,Lotrel,HCTZ.  She denies hypoglycemic events.   Shoulder Pain   The pain is present in the left shoulder and neck. This is a recurrent problem. The current episode started in the past 7 days. The problem occurs constantly. The problem has been unchanged. The pain is at a severity of 8/10. Associated symptoms include a limited range of motion and stiffness. Pertinent negatives include no fever, joint locking, joint swelling, numbness or tingling. The symptoms are aggravated by lying down and activity. She has tried oral narcotics for the symptoms. The treatment provided mild relief. Her past medical history is significant for diabetes and osteoarthritis.   Pain is exacerbated by movement and lying on left side. It is alleviated by rest.  Hx of osteopenia.  She also mentions that she had an "allergy shot" 3 days ago, same  arm.  Review of Systems  Constitutional: Positive for fatigue. Negative for appetite change, diaphoresis, fever and unexpected weight change.  Respiratory: Negative for chest tightness, shortness of breath and wheezing.   Cardiovascular: Negative for chest pain, palpitations and leg swelling.  Gastrointestinal: Negative for nausea and vomiting.  Genitourinary: Negative for decreased urine volume and hematuria.  Musculoskeletal: Positive for arthralgias, back pain, gait problem, neck pain and stiffness. Negative for joint swelling.  Skin: Negative for rash and wound.  Allergic/Immunologic: Positive for environmental allergies.  Neurological: Positive for light-headedness (occasional when getting up). Negative for tingling, syncope, weakness, numbness and headaches.  Psychiatric/Behavioral: Positive for sleep disturbance. Negative for confusion.    Current Outpatient Prescriptions on File Prior to Visit  Medication Sig Dispense Refill  . amLODipine-benazepril (LOTREL) 5-40 MG capsule TAKE ONE CAPSULE BY MOUTH EVERY DAY 30 capsule 5  . Ascorbic Acid (VITAMIN C) 1000 MG tablet Take 1,000 mg by mouth 2 (two) times daily.    . Azelastine-Fluticasone 137-50 MCG/ACT SUSP Place 1 spray into both nostrils daily. 1 Bottle 2  . benzonatate (TESSALON) 200 MG capsule TAKE ONE CAPSULE BY MOUTH TWICE A DAY AS NEEDED FOR COUGH 30 capsule 1  . Cholecalciferol (VITAMIN D) 2000 UNITS CAPS Take 1 capsule by mouth daily. Taking 2 caps daily 4,000    . cyanocobalamin 2000 MCG tablet Take 2,000 mcg by mouth daily.      . cyclobenzaprine (FLEXERIL) 10 MG tablet TAKE 1 TABLET BY MOUTH  3 TIMES A DAY AS NEEDED FOR MUSCLE SPASMS 30 tablet 4  . FERREX 150 150 MG capsule TAKE ONE CAPSULE BY MOUTH EVERY DAY 30 capsule 5  . fexofenadine (ALLEGRA) 180 MG tablet Take 180 mg by mouth daily as needed.     . furosemide (LASIX) 20 MG tablet Take 1 tablet (20 mg total) by mouth daily as needed. 30 tablet 1  . glipiZIDE  (GLUCOTROL XL) 5 MG 24 hr tablet TAKE 1 TABLET BY MOUTH EVERY DAY 90 tablet 3  . hydrochlorothiazide (HYDRODIURIL) 25 MG tablet TAKE 1 TABLET BY MOUTH EVERY DAY 90 tablet 1  . HYDROcodone-acetaminophen (NORCO/VICODIN) 5-325 MG tablet Take 1 tablet by mouth every 6 (six) hours as needed. 60 tablet 0  . Lancets 30G MISC USE TWICE DAILY AS DIRECTED FOR GLUCOSE TESTING AND MONITORING 100 each 4  . Magnesium 250 MG TABS Take 1 tablet by mouth as needed.     . metFORMIN (GLUCOPHAGE) 1000 MG tablet TAKE 1 TABLET BY MOUTH TWICE A DAY WITH FOOD 180 tablet 1  . metoprolol succinate (TOPROL-XL) 50 MG 24 hr tablet Take 1 tablet (50 mg total) by mouth every morning. Take with or immediately following a meal. 90 tablet 3  . Multiple Vitamin (MULTIVITAMIN) tablet Take 1 tablet by mouth daily.      Marland Kitchen ONE TOUCH LANCETS MISC Test twice daily. 200 each 5  . ONETOUCH VERIO test strip TEST TWICE A DAY 200 each 5  . pioglitazone (ACTOS) 45 MG tablet Take 1 tablet (45 mg total) by mouth daily. 90 tablet 2  . pravastatin (PRAVACHOL) 40 MG tablet Take 1 tablet (40 mg total) by mouth daily. 90 tablet 1  . sitaGLIPtin (JANUVIA) 100 MG tablet Take 1 tablet (100 mg total) by mouth daily. 30 tablet 5  . triamcinolone cream (KENALOG) 0.1 % Apply 1 application topically 2 (two) times daily. 30 g 3   No current facility-administered medications on file prior to visit.      Past Medical History:  Diagnosis Date  . ALLERGIC RHINITIS 06/17/2010  . ANEMIA-NOS 06/17/2010  . BREAST CANCER, HX OF 06/17/2010  . Cancer [facility]) 2008   right breast  . Cataract   . COPD 06/17/2010  . DIABETES MELLITUS, TYPE II 06/17/2010  . Endometriosis   . Fibroid    FIBROID  . HYPERLIPIDEMIA 06/17/2010  . HYPERTENSION 06/17/2010  . Leg swelling   . LOW BACK PAIN 06/17/2010  . OSTEOARTHRITIS 06/17/2010  . OSTEOPENIA 06/17/2010  . Weakness    No Known Allergies  Social History   Social History  . Marital status: Married    Spouse  name: N/A  . Number of children: N/A  . Years of education: N/A   Social History Main Topics  . Smoking status: Never Smoker  . Smokeless tobacco: Never Used  . Alcohol use No  . Drug use: No  . Sexual activity: No   Other Topics Concern  . None   Social History Narrative  . None    Vitals:   12/17/16 1038 12/17/16 1122  BP: 140/80   Pulse: (!) 124 100  Resp: 12   O2 sat at RA 95%. Body mass index is 44.84 kg/m.   Physical Exam  Nursing note and vitals reviewed. Constitutional: She is oriented to person, place, and time. She appears well-developed. She does not appear ill. No distress.  HENT:  Head: Atraumatic.  Eyes: Conjunctivae and EOM are normal. Pupils are equal, round, and reactive to light.  Cardiovascular:  Pulses:      Radial pulses are 2+ on the left side.  Respiratory: Effort normal and breath sounds normal. No respiratory distress. She exhibits no tenderness.  Musculoskeletal: She exhibits no edema.       Left shoulder: She exhibits decreased range of motion and tenderness. She exhibits no bony tenderness.  Pain tenderness upon palpation of left cervical and interscapular paraspinal muscles.Mild right cervical. + Muscle spasm left cervical and trapezium. Tenderness of left trapezium.  Shoulders,both with limitation of ROM: active > passive,pain elicited.  Left shoulder: No significant deformity, no edema or erythema appreciated.Tenderenss upon palpation of posterior and anterior aspect. Luan Pulling' test pos, drop arm rotator cuff test pos, empty can supraspinatus test pos, cross body adduction test pos, lift-Off Subscapularis test pos.   Lymphadenopathy:    She has no cervical adenopathy.  Neurological: She is alert and oriented to person, place, and time.  No focal deficit appreciated. Stable gait with no assistance. Difficulty getting up from chair.  Antalgic gait.  Skin: Skin is warm. No ecchymosis and no rash noted. No erythema.  Psychiatric: She  has a normal mood and affect.  Well groomed, good eye contact.    ASSESSMENT AND PLAN:   Kristelle was seen today for left shoulder pain.  Diagnoses and all orders for this visit:  Fall on stairs, initial encounter  Reporting frequent falls. Fall prevention discussed. PT to be considered. Some of her medications could increase the risk of falls as well as medical conditions.  -     DG Cervical Spine Complete; Future -     DG Shoulder Left; Future  Left shoulder pain, unspecified chronicity  She has prior Hx. Because her Hx of osteopenia imaging was recommended. ROM exercises. Topical Voltaren may help. She may benefit from PT if pain is persistent.  -     DG Shoulder Left; Future -     diclofenac sodium (VOLTAREN) 1 % GEL; Apply 4 g topically 4 (four) times daily.  Cervicalgia  Chronic but aggravated after fall. Local heat, ROM exercises. Further recommendations will be given according to imaging results.  -     DG Cervical Spine Complete; Future  Muscle strain  Pain started a couple days after fall, explained that most likely she strained a few muscles on her upper back and shoulder at the time she fell. She also has Hx of chronic pain/OA, requesting refill on her pain medication, she had to cancel f/u appt with her PCP. Explained that she should continue following with her PCP for chronic pain management, educated about controlled medications recommendations. Also educated about some side effects. Note for work provided.    -[name] was advised to seek immediate medical attention if symptoms suddenly get worse. Otherwise she needs to arrange appt with her PCP to follow on her chronic medical problems and to follow on today's visit if needed. She voices understanding.       Betty G. Martinique, MD  [facility]. [place] office.

## 2016-12-17 NOTE — Patient Instructions (Addendum)
  [name] I have seen you today for an acute visit.  A few things to remember from today's visit:   Fall on stairs, initial encounter - Plan: DG Cervical Spine Complete, DG Shoulder Left  Left shoulder pain, unspecified chronicity - Plan: DG Shoulder Left, diclofenac sodium (VOLTAREN) 1 % GEL  Cervicalgia - Plan: DG Cervical Spine Complete  Muscle strain    Some if medications you are taking increase risk of falls.  Osteoarthritis is a chronic condition and gets worse with age.  The following may help:  Over the counter topical medications: Icy Hot or Asper cream with Lidocaine. Tai Chi or PT. Fall prevention. Avoid weight gain. Fish oil, over the counter Megared for example, 2 capsules daily.   Continue pain management with PCP.   In general please monitor for signs of worsening symptoms and seek immediate medical attention if any concerning.  If symptoms are not resolved in 3-4 weeks you should schedule a follow up appointment with your doctor, before if needed.  Please be sure you have an appointment already scheduled with your PCP before you leave today.

## 2016-12-20 DIAGNOSIS — L401 Generalized pustular psoriasis: Secondary | ICD-10-CM | POA: Diagnosis not present

## 2016-12-22 ENCOUNTER — Other Ambulatory Visit: Payer: Self-pay | Admitting: Internal Medicine

## 2016-12-23 ENCOUNTER — Encounter: Payer: Self-pay | Admitting: Internal Medicine

## 2016-12-23 ENCOUNTER — Other Ambulatory Visit: Payer: Self-pay | Admitting: Internal Medicine

## 2016-12-23 ENCOUNTER — Ambulatory Visit (INDEPENDENT_AMBULATORY_CARE_PROVIDER_SITE_OTHER): Payer: Medicare HMO | Admitting: Internal Medicine

## 2016-12-23 VITALS — BP 132/74 | HR 102 | Temp 97.7°F | Ht 61.0 in | Wt 236.4 lb

## 2016-12-23 DIAGNOSIS — J301 Allergic rhinitis due to pollen: Secondary | ICD-10-CM | POA: Diagnosis not present

## 2016-12-23 DIAGNOSIS — E119 Type 2 diabetes mellitus without complications: Secondary | ICD-10-CM | POA: Diagnosis not present

## 2016-12-23 DIAGNOSIS — I10 Essential (primary) hypertension: Secondary | ICD-10-CM

## 2016-12-23 DIAGNOSIS — E785 Hyperlipidemia, unspecified: Secondary | ICD-10-CM

## 2016-12-23 DIAGNOSIS — J3089 Other allergic rhinitis: Secondary | ICD-10-CM | POA: Diagnosis not present

## 2016-12-23 LAB — POCT GLYCOSYLATED HEMOGLOBIN (HGB A1C): HEMOGLOBIN A1C: 6.6

## 2016-12-23 MED ORDER — CYCLOBENZAPRINE HCL 5 MG PO TABS: 5.0000 mg | ORAL_TABLET | Freq: Every day | ORAL | 3 refills | Status: DC

## 2016-12-23 NOTE — Progress Notes (Signed)
Subjective:    Patient ID: Doris Jackson, female    DOB: June 18, 1941, 76 y.o.   MRN: [id number]  HPI  Lab Results  Component Value Date   HGBA1C 6.4 02/14/2016     BP Readings from Last 3 Encounters:  12/23/16 132/74  12/17/16 140/80  09/11/16 132/80    Wt Readings from Last 3 Encounters:  12/23/16 236 lb 6.4 oz (107.2 kg)  12/17/16 239 lb 4 oz (108.5 kg)  09/11/16 246 lb 12.8 oz (5.69 kg)   76 year old patient who is seen today after a 10 month absence.  She has type 2 diabetes.  She was seen earlier month after a fall and has improved nicely Doing quite well today.  There has been some modest weight loss She has low back pain and often takes both Flexeril and hydrocodone together.  She admits this combination makes her a bit sedated  She did have an eye examination in December and states that she has a follow-up exam scheduled for tomorrow  Her low back pain has been stable  Past Medical History:  Diagnosis Date  . ALLERGIC RHINITIS 06/17/2010  . ANEMIA-NOS 06/17/2010  . BREAST CANCER, HX OF 06/17/2010  . Cancer [facility]) 2008   right breast  . Cataract   . COPD 06/17/2010  . DIABETES MELLITUS, TYPE II 06/17/2010  . Endometriosis   . Fibroid    FIBROID  . HYPERLIPIDEMIA 06/17/2010  . HYPERTENSION 06/17/2010  . Leg swelling   . LOW BACK PAIN 06/17/2010  . OSTEOARTHRITIS 06/17/2010  . OSTEOPENIA 06/17/2010  . Weakness      Social History   Social History  . Marital status: Married    Spouse name: N/A  . Number of children: N/A  . Years of education: N/A   Occupational History  . Not on file.   Social History Main Topics  . Smoking status: Never Smoker  . Smokeless tobacco: Never Used  . Alcohol use No  . Drug use: No  . Sexual activity: No   Other Topics Concern  . Not on file   Social History Narrative  . No narrative on file    Past Surgical History:  Procedure Laterality Date  . ABDOMINAL HYSTERECTOMY  1988   TAH,LSO  . BREAST  LUMPECTOMY  2007   LUMPECTOMY FOLLOWED BY RADIATION  . OOPHORECTOMY  1988   TAH,LSO  . TUBAL LIGATION      Family History  Problem Relation Age of Onset  . Diabetes Mother   . Hypertension Sister   . Asthma Father   . Diabetes Brother     No Known Allergies  Current Outpatient Prescriptions on File Prior to Visit  Medication Sig Dispense Refill  . Ascorbic Acid (VITAMIN C) 1000 MG tablet Take 1,000 mg by mouth 2 (two) times daily.    . Azelastine-Fluticasone 137-50 MCG/ACT SUSP Place 1 spray into both nostrils daily. 1 Bottle 2  . benzonatate (TESSALON) 200 MG capsule TAKE ONE CAPSULE BY MOUTH TWICE A DAY AS NEEDED FOR COUGH 30 capsule 1  . Cholecalciferol (VITAMIN D) 2000 UNITS CAPS Take 1 capsule by mouth daily. Taking 2 caps daily 4,000    . cyanocobalamin 2000 MCG tablet Take 2,000 mcg by mouth daily.      . cyclobenzaprine (FLEXERIL) 10 MG tablet TAKE 1 TABLET BY MOUTH 3 TIMES A DAY AS NEEDED FOR MUSCLE SPASMS 30 tablet 4  . diclofenac sodium (VOLTAREN) 1 % GEL Apply 4 g topically 4 (four) times daily.  4 Tube 0  . FERREX 150 150 MG capsule TAKE ONE CAPSULE BY MOUTH EVERY DAY 30 capsule 5  . fexofenadine (ALLEGRA) 180 MG tablet Take 180 mg by mouth daily as needed.     . furosemide (LASIX) 20 MG tablet Take 1 tablet (20 mg total) by mouth daily as needed. 30 tablet 1  . glipiZIDE (GLUCOTROL XL) 5 MG 24 hr tablet TAKE 1 TABLET BY MOUTH EVERY DAY 90 tablet 3  . hydrochlorothiazide (HYDRODIURIL) 25 MG tablet TAKE 1 TABLET BY MOUTH EVERY DAY 90 tablet 1  . HYDROcodone-acetaminophen (NORCO/VICODIN) 5-325 MG tablet Take 1 tablet by mouth every 6 (six) hours as needed. 60 tablet 0  . Lancets 30G MISC USE TWICE DAILY AS DIRECTED FOR GLUCOSE TESTING AND MONITORING 100 each 4  . Magnesium 250 MG TABS Take 1 tablet by mouth as needed.     . metFORMIN (GLUCOPHAGE) 1000 MG tablet TAKE 1 TABLET BY MOUTH TWICE A DAY WITH FOOD 180 tablet 1  . metoprolol succinate (TOPROL-XL) 50 MG 24 hr tablet  Take 1 tablet (50 mg total) by mouth every morning. Take with or immediately following a meal. 90 tablet 3  . Multiple Vitamin (MULTIVITAMIN) tablet Take 1 tablet by mouth daily.      Marland Kitchen ONE TOUCH LANCETS MISC Test twice daily. 200 each 5  . ONETOUCH VERIO test strip TEST TWICE A DAY 200 each 5  . pioglitazone (ACTOS) 45 MG tablet Take 1 tablet (45 mg total) by mouth daily. 90 tablet 2  . pravastatin (PRAVACHOL) 40 MG tablet Take 1 tablet (40 mg total) by mouth daily. 90 tablet 1  . sitaGLIPtin (JANUVIA) 100 MG tablet Take 1 tablet (100 mg total) by mouth daily. 30 tablet 5  . triamcinolone cream (KENALOG) 0.1 % Apply 1 application topically 2 (two) times daily. 30 g 3   No current facility-administered medications on file prior to visit.     BP 132/74 (BP Location: Left Arm, Patient Position: Sitting, Cuff Size: Large)   Pulse (!) 102   Temp 97.7 F (36.5 C) (Oral)   Ht 5\' 1"  (1.549 m)   Wt 236 lb 6.4 oz (107.2 kg)   SpO2 95%   BMI 44.67 kg/m     Review of Systems  Constitutional: Negative.   HENT: Negative for congestion, dental problem, hearing loss, rhinorrhea, sinus pressure, sore throat and tinnitus.   Eyes: Negative for pain, discharge and visual disturbance.  Respiratory: Negative for cough and shortness of breath.   Cardiovascular: Negative for chest pain, palpitations and leg swelling.  Gastrointestinal: Negative for abdominal distention, abdominal pain, blood in stool, constipation, diarrhea, nausea and vomiting.  Genitourinary: Negative for difficulty urinating, dysuria, flank pain, frequency, hematuria, pelvic pain, urgency, vaginal bleeding, vaginal discharge and vaginal pain.  Musculoskeletal: Positive for arthralgias, back pain and gait problem. Negative for joint swelling.  Skin: Negative for rash.  Neurological: Negative for dizziness, syncope, speech difficulty, weakness, numbness and headaches.  Hematological: Negative for adenopathy.  Psychiatric/Behavioral:  Negative for agitation, behavioral problems and dysphoric mood. The patient is not nervous/anxious.        Objective:   Physical Exam  Constitutional: She is oriented to person, place, and time. She appears well-developed and well-nourished.  Weight 236 Blood pressure 130/70  HENT:  Head: Normocephalic.  Right Ear: External ear normal.  Left Ear: External ear normal.  Mouth/Throat: Oropharynx is clear and moist.  Eyes: Conjunctivae and EOM are normal. Pupils are equal, round, and reactive to  light.  Neck: Normal range of motion. Neck supple. No thyromegaly present.  Cardiovascular: Normal rate, regular rhythm and normal heart sounds.   Pedal pulses not easily palpable  Pulmonary/Chest: Effort normal and breath sounds normal.  Abdominal: Soft. Bowel sounds are normal. She exhibits no mass. There is no tenderness.  Musculoskeletal: Normal range of motion. She exhibits edema.  Lymphadenopathy:    She has no cervical adenopathy.  Neurological: She is alert and oriented to person, place, and time.  Skin: Skin is warm and dry. No rash noted.  Psychiatric: She has a normal mood and affect. Her behavior is normal.          Assessment & Plan:   Diabetes mellitus.  Will review a hemoglobin A1c.  CPX 3 months with follow-up lipid profile, urine for microalbumin Essential hypertension, well-controlled Obesity.  Modest improvement Low back pain.  The patient was told not to take Flexeril and hydrocodone together.  An attempt was made to discontinue Flexeril, but the patient states that she has and suitable benefit from this medication.  Will decrease dose to 5 mg to take at bedtime only as needed  Eye examination encouraged Follow-up 3 months  Kanchan Gal Pilar Plate

## 2016-12-23 NOTE — Patient Instructions (Addendum)
WE NOW OFFER   [facility] FAST TRACK!!!  SAME DAY Appointments for ACUTE CARE  Such as: Sprains, Injuries, cuts, abrasions, rashes, muscle pain, joint pain, back pain Colds, flu, sore throats, headache, allergies, cough, fever  Ear pain, sinus and eye infections Abdominal pain, nausea, vomiting, diarrhea, upset stomach Animal/insect bites  3 Easy Ways to Schedule: Walk-In Scheduling Call in scheduling Mychart Sign-up: [email or web address]   Did not use both hydrocodone and Flexeril at the same time.  Use Flexeril at bedtime only as needed   Please check your hemoglobin A1c every 3 months  Limit your sodium (Salt) intake    It is important that you exercise regularly, at least 20 minutes 3 to 4 times per week.  If you develop chest pain or shortness of breath seek  medical attention.  You need to lose weight.  Consider a lower calorie diet and regular exercise.  Return in 3 months for follow-up  Please see your eye doctor yearly to check for diabetic eye damage

## 2016-12-24 ENCOUNTER — Encounter: Payer: Self-pay | Admitting: Gastroenterology

## 2016-12-24 DIAGNOSIS — S0501XA Injury of conjunctiva and corneal abrasion without foreign body, right eye, initial encounter: Secondary | ICD-10-CM | POA: Diagnosis not present

## 2016-12-24 DIAGNOSIS — H1851 Endothelial corneal dystrophy: Secondary | ICD-10-CM | POA: Diagnosis not present

## 2016-12-25 DIAGNOSIS — J3089 Other allergic rhinitis: Secondary | ICD-10-CM | POA: Diagnosis not present

## 2016-12-25 DIAGNOSIS — J301 Allergic rhinitis due to pollen: Secondary | ICD-10-CM | POA: Diagnosis not present

## 2017-01-13 ENCOUNTER — Telehealth: Payer: Self-pay | Admitting: Internal Medicine

## 2017-01-13 NOTE — Telephone Encounter (Signed)
Pt need new Rx for Hydrocodone   Pt is aware of 3 business days for refills and someone will call when ready for pick up. °

## 2017-01-13 NOTE — Telephone Encounter (Signed)
Last refilled  Medication on 12/15/16. Rx must last 30 days. May print Rx on or after 01/14/17.

## 2017-01-14 MED ORDER — HYDROCODONE-ACETAMINOPHEN 5-325 MG PO TABS: 1.0000 | ORAL_TABLET | Freq: Four times a day (QID) | ORAL | 0 refills | Status: DC | PRN

## 2017-01-14 NOTE — Telephone Encounter (Signed)
Rx printed and given to K for signature.

## 2017-01-14 NOTE — Telephone Encounter (Signed)
Pt. advised rx ready for pick up 

## 2017-01-15 DIAGNOSIS — J301 Allergic rhinitis due to pollen: Secondary | ICD-10-CM | POA: Diagnosis not present

## 2017-01-15 DIAGNOSIS — J3089 Other allergic rhinitis: Secondary | ICD-10-CM | POA: Diagnosis not present

## 2017-02-05 DIAGNOSIS — J301 Allergic rhinitis due to pollen: Secondary | ICD-10-CM | POA: Diagnosis not present

## 2017-02-05 DIAGNOSIS — J3089 Other allergic rhinitis: Secondary | ICD-10-CM | POA: Diagnosis not present

## 2017-02-09 ENCOUNTER — Other Ambulatory Visit: Payer: Self-pay | Admitting: Internal Medicine

## 2017-02-13 ENCOUNTER — Telehealth: Payer: Self-pay | Admitting: Internal Medicine

## 2017-02-13 NOTE — Telephone Encounter (Signed)
Pt need new rx hydrocodone °

## 2017-02-16 MED ORDER — HYDROCODONE-ACETAMINOPHEN 5-325 MG PO TABS: 1.0000 | ORAL_TABLET | Freq: Four times a day (QID) | ORAL | 0 refills | Status: DC | PRN

## 2017-02-16 NOTE — Telephone Encounter (Signed)
Rx printed awaiting to be signed.  

## 2017-02-17 NOTE — Telephone Encounter (Signed)
Pt notified Rx ready for pickup. Rx printed and signed.  

## 2017-02-24 DIAGNOSIS — J3089 Other allergic rhinitis: Secondary | ICD-10-CM | POA: Diagnosis not present

## 2017-02-24 DIAGNOSIS — J301 Allergic rhinitis due to pollen: Secondary | ICD-10-CM | POA: Diagnosis not present

## 2017-02-25 ENCOUNTER — Other Ambulatory Visit: Payer: Self-pay | Admitting: Internal Medicine

## 2017-03-03 DIAGNOSIS — R69 Illness, unspecified: Secondary | ICD-10-CM | POA: Diagnosis not present

## 2017-03-17 DIAGNOSIS — J301 Allergic rhinitis due to pollen: Secondary | ICD-10-CM | POA: Diagnosis not present

## 2017-03-17 DIAGNOSIS — J3089 Other allergic rhinitis: Secondary | ICD-10-CM | POA: Diagnosis not present

## 2017-03-18 ENCOUNTER — Other Ambulatory Visit: Payer: Self-pay | Admitting: Internal Medicine

## 2017-03-18 NOTE — Telephone Encounter (Signed)
Pt request refill  °HYDROcodone-acetaminophen (NORCO/VICODIN) 5-325 MG tablet °

## 2017-03-19 ENCOUNTER — Other Ambulatory Visit: Payer: Self-pay | Admitting: Internal Medicine

## 2017-03-19 NOTE — Telephone Encounter (Signed)
Patient would like a refill. Last OV 12/23/2016. Last refill 12/23/2016

## 2017-03-20 ENCOUNTER — Other Ambulatory Visit: Payer: Self-pay | Admitting: Emergency Medicine

## 2017-03-20 MED ORDER — HYDROCODONE-ACETAMINOPHEN 5-325 MG PO TABS: 1.0000 | ORAL_TABLET | Freq: Four times a day (QID) | ORAL | 0 refills | Status: DC | PRN

## 2017-03-20 NOTE — Telephone Encounter (Signed)
Okay #60 Notify patient that any further refills must be accompanied by an office visit

## 2017-03-20 NOTE — Telephone Encounter (Signed)
FYI

## 2017-03-20 NOTE — Telephone Encounter (Signed)
Script has been printed and is awaiting PCP signature.

## 2017-03-23 NOTE — Telephone Encounter (Signed)
Pt aware that Rx is up front to be picked up. Nothing further needed.

## 2017-03-26 ENCOUNTER — Other Ambulatory Visit: Payer: Self-pay | Admitting: Internal Medicine

## 2017-04-13 ENCOUNTER — Ambulatory Visit (INDEPENDENT_AMBULATORY_CARE_PROVIDER_SITE_OTHER): Payer: Medicare HMO | Admitting: *Deleted

## 2017-04-13 DIAGNOSIS — Z23 Encounter for immunization: Secondary | ICD-10-CM

## 2017-04-15 DIAGNOSIS — J3089 Other allergic rhinitis: Secondary | ICD-10-CM | POA: Diagnosis not present

## 2017-04-15 DIAGNOSIS — J301 Allergic rhinitis due to pollen: Secondary | ICD-10-CM | POA: Diagnosis not present

## 2017-04-16 ENCOUNTER — Telehealth: Payer: Self-pay | Admitting: Internal Medicine

## 2017-04-16 NOTE — Telephone Encounter (Signed)
° ° ° ° ° °  Pt request refill of the following: ° °HYDROcodone-acetaminophen (NORCO/VICODIN) 5-325 MG tablet ° ° °Phamacy: °

## 2017-04-16 NOTE — Telephone Encounter (Signed)
The below letter was mailed to pt's home address.   "Dear patients, The Strengthen Opioid Misuse Prevention (STOP) Act of 2017 has been signed into law to fight the opioid problem that has had a major impact in [place] and the [place].  [facility] wants to be sure to follow the law while we continue to provide you with the exceptional care you have come to expect.   For most of you, nothing will change.  Those of you who get a regular long-term pain management prescription from one of our providers will need to schedule a separate pain management appointment.  At this appointment, your provider will talk you through the changes we have had to begin because of the STOP Act.  It's the law.  What this means for you is that now you will need to have a separate pain management visit with your provider at least every 3 months.  You will also need to sign an updated controlled substance contract that will be discussed with you in detail during your visit.   We know this change can be confusing and uncomfortable, but we are here to help you every step of the way.  Please contact us today to set up your pain management appointment.    Sincerely,   [facility] " 

## 2017-04-16 NOTE — Telephone Encounter (Signed)
Called pt and left a detailed message stating that it's a requirement for her to have a pain management  appt in order to have the below medication refilled.

## 2017-04-21 ENCOUNTER — Encounter: Payer: Self-pay | Admitting: Internal Medicine

## 2017-04-21 ENCOUNTER — Ambulatory Visit (INDEPENDENT_AMBULATORY_CARE_PROVIDER_SITE_OTHER): Payer: Medicare HMO | Admitting: Internal Medicine

## 2017-04-21 VITALS — BP 130/74 | HR 92 | Ht 61.0 in | Wt 242.2 lb

## 2017-04-21 DIAGNOSIS — I10 Essential (primary) hypertension: Secondary | ICD-10-CM

## 2017-04-21 DIAGNOSIS — Z79899 Other long term (current) drug therapy: Secondary | ICD-10-CM

## 2017-04-21 DIAGNOSIS — G8929 Other chronic pain: Secondary | ICD-10-CM

## 2017-04-21 DIAGNOSIS — R52 Pain, unspecified: Secondary | ICD-10-CM

## 2017-04-21 DIAGNOSIS — M545 Low back pain: Secondary | ICD-10-CM

## 2017-04-21 MED ORDER — HYDROCODONE-ACETAMINOPHEN 5-325 MG PO TABS: 1.0000 | ORAL_TABLET | Freq: Four times a day (QID) | ORAL | 0 refills | Status: DC | PRN

## 2017-04-21 NOTE — Patient Instructions (Signed)
Return as scheduled for your routine follow-up 

## 2017-04-21 NOTE — Progress Notes (Signed)
   Subjective:    Patient ID: Doris Jackson, female    DOB: 1941/03/22, 76 y.o.   MRN: [id number]  HPI  76 year old patient who is seen today for pain management evaluation per opioid protocol.  [place] printed, initialed  and scanned into the EMR. New control drug.  Pain contract reviewed and signed  Indication for chronic opioid: patient has chronic low back pain is followed by orthopedics.  She also has osteoarthritis with hip and leg pain.  She still works as a Training and development officer at [employer].  Tramadol has not been effective and not well tolerated in the past Medication and dose: hydrocodone 5-acetaminophen 325 mg daily.  Takes twice daily # pills per month: 60 Last UDS date: 0/23/2018 Pain contract signed (Y/N): yes Date narcotic database last reviewed (include red flags): 04/21/2017  Review of Systems     Objective:   Physical Exam        Assessment & Plan:   Encounter for chronic pain management (G89.29) Narcotic use  (711.90) Pain management contract signed (Z02.89)  Nyoka Cowden

## 2017-04-27 LAB — PAIN MGMT, PROFILE 8 W/CONF, U
6 Acetylmorphine: NEGATIVE ng/mL (ref <10)
ALCOHOL METABOLITES: NEGATIVE ng/mL (ref <500)
AMPHETAMINES: NEGATIVE ng/mL (ref <500)
Benzodiazepines: NEGATIVE ng/mL (ref <100)
Buprenorphine, Urine: NEGATIVE ng/mL (ref <5)
CODEINE: NEGATIVE ng/mL (ref <50)
CREATININE: 93.3 mg/dL
Cocaine Metabolite: NEGATIVE ng/mL (ref <150)
Hydrocodone: 525 ng/mL — ABNORMAL HIGH (ref <50)
Hydromorphone: 202 ng/mL — ABNORMAL HIGH (ref <50)
MARIJUANA METABOLITE: NEGATIVE ng/mL (ref <20)
MDMA: NEGATIVE ng/mL (ref <500)
MORPHINE: NEGATIVE ng/mL (ref <50)
Norhydrocodone: 745 ng/mL — ABNORMAL HIGH (ref <50)
OPIATES: POSITIVE ng/mL — AB (ref <100)
OXIDANT: NEGATIVE ug/mL (ref <200)
OXYCODONE: NEGATIVE ng/mL (ref <100)
PH: 6.43 (ref 4.5–9.0)

## 2017-04-29 ENCOUNTER — Other Ambulatory Visit: Payer: Self-pay | Admitting: Internal Medicine

## 2017-04-30 ENCOUNTER — Other Ambulatory Visit: Payer: Self-pay | Admitting: Internal Medicine

## 2017-05-05 DIAGNOSIS — J3089 Other allergic rhinitis: Secondary | ICD-10-CM | POA: Diagnosis not present

## 2017-05-05 DIAGNOSIS — J301 Allergic rhinitis due to pollen: Secondary | ICD-10-CM | POA: Diagnosis not present

## 2017-05-25 DIAGNOSIS — J301 Allergic rhinitis due to pollen: Secondary | ICD-10-CM | POA: Diagnosis not present

## 2017-05-25 DIAGNOSIS — J3089 Other allergic rhinitis: Secondary | ICD-10-CM | POA: Diagnosis not present

## 2017-05-30 DIAGNOSIS — R69 Illness, unspecified: Secondary | ICD-10-CM | POA: Diagnosis not present

## 2017-06-11 ENCOUNTER — Ambulatory Visit (INDEPENDENT_AMBULATORY_CARE_PROVIDER_SITE_OTHER): Payer: Self-pay | Admitting: Orthopaedic Surgery

## 2017-06-13 DIAGNOSIS — L401 Generalized pustular psoriasis: Secondary | ICD-10-CM | POA: Diagnosis not present

## 2017-06-19 ENCOUNTER — Other Ambulatory Visit: Payer: Self-pay | Admitting: Internal Medicine

## 2017-06-22 ENCOUNTER — Other Ambulatory Visit: Payer: Self-pay | Admitting: Internal Medicine

## 2017-06-25 DIAGNOSIS — J3089 Other allergic rhinitis: Secondary | ICD-10-CM | POA: Diagnosis not present

## 2017-06-25 DIAGNOSIS — J301 Allergic rhinitis due to pollen: Secondary | ICD-10-CM | POA: Diagnosis not present

## 2017-07-02 ENCOUNTER — Encounter: Payer: Self-pay | Admitting: Internal Medicine

## 2017-07-02 ENCOUNTER — Ambulatory Visit (INDEPENDENT_AMBULATORY_CARE_PROVIDER_SITE_OTHER): Payer: Medicare HMO | Admitting: Internal Medicine

## 2017-07-02 VITALS — BP 142/70 | HR 88 | Temp 97.5°F | Ht 61.0 in | Wt 238.0 lb

## 2017-07-02 DIAGNOSIS — I10 Essential (primary) hypertension: Secondary | ICD-10-CM

## 2017-07-02 DIAGNOSIS — E119 Type 2 diabetes mellitus without complications: Secondary | ICD-10-CM | POA: Diagnosis not present

## 2017-07-02 DIAGNOSIS — R52 Pain, unspecified: Secondary | ICD-10-CM

## 2017-07-02 LAB — CBC WITH DIFFERENTIAL/PLATELET
BASOS PCT: 0.6 % (ref 0.0–3.0)
Basophils Absolute: 0 10*3/uL (ref 0.0–0.1)
EOS PCT: 2.2 % (ref 0.0–5.0)
Eosinophils Absolute: 0.1 10*3/uL (ref 0.0–0.7)
HCT: 32.2 % — ABNORMAL LOW (ref 36.0–46.0)
HEMOGLOBIN: 10.5 g/dL — AB (ref 12.0–15.0)
LYMPHS PCT: 29 % (ref 12.0–46.0)
Lymphs Abs: 1.9 10*3/uL (ref 0.7–4.0)
MCHC: 32.6 g/dL (ref 30.0–36.0)
MCV: 80.2 fl (ref 78.0–100.0)
MONOS PCT: 10.9 % (ref 3.0–12.0)
Monocytes Absolute: 0.7 10*3/uL (ref 0.1–1.0)
NEUTROS ABS: 3.8 10*3/uL (ref 1.4–7.7)
Neutrophils Relative %: 57.3 % (ref 43.0–77.0)
PLATELETS: 146 10*3/uL — AB (ref 150.0–400.0)
RBC: 4.01 Mil/uL (ref 3.87–5.11)
RDW: 17.3 % — AB (ref 11.5–15.5)
WBC: 6.6 10*3/uL (ref 4.0–10.5)

## 2017-07-02 LAB — COMPREHENSIVE METABOLIC PANEL
ALBUMIN: 3.9 g/dL (ref 3.5–5.2)
ALT: 8 U/L (ref 0–35)
AST: 8 U/L (ref 0–37)
Alkaline Phosphatase: 80 U/L (ref 39–117)
BUN: 27 mg/dL — AB (ref 6–23)
CHLORIDE: 102 meq/L (ref 96–112)
CO2: 30 mEq/L (ref 19–32)
CREATININE: 1.01 mg/dL (ref 0.40–1.20)
Calcium: 9.2 mg/dL (ref 8.4–10.5)
GFR: 68.64 mL/min (ref >60.00)
GLUCOSE: 106 mg/dL — AB (ref 70–99)
POTASSIUM: 4.1 meq/L (ref 3.5–5.1)
SODIUM: 140 meq/L (ref 135–145)
Total Bilirubin: 0.4 mg/dL (ref 0.2–1.2)
Total Protein: 7.1 g/dL (ref 6.0–8.3)

## 2017-07-02 LAB — POCT GLYCOSYLATED HEMOGLOBIN (HGB A1C): HEMOGLOBIN A1C: 7.2

## 2017-07-02 LAB — MICROALBUMIN / CREATININE URINE RATIO
Creatinine,U: 83 mg/dL
Microalb Creat Ratio: 1.6 mg/g (ref 0.0–30.0)
Microalb, Ur: 1.3 mg/dL (ref 0.0–1.9)

## 2017-07-02 LAB — LIPID PANEL
CHOLESTEROL: 160 mg/dL (ref 0–200)
HDL: 40.4 mg/dL (ref >39.00)
LDL CALC: 91 mg/dL (ref 0–99)
NONHDL: 119.18
Total CHOL/HDL Ratio: 4
Triglycerides: 143 mg/dL (ref 0.0–149.0)
VLDL: 28.6 mg/dL (ref 0.0–40.0)

## 2017-07-02 LAB — TSH: TSH: 1.2 u[IU]/mL (ref 0.35–4.50)

## 2017-07-02 MED ORDER — HYDROCODONE-ACETAMINOPHEN 5-325 MG PO TABS: 1.0000 | ORAL_TABLET | Freq: Four times a day (QID) | ORAL | 0 refills | Status: DC | PRN

## 2017-07-02 NOTE — Patient Instructions (Signed)
Limit your sodium (Salt) intake  Please check your blood pressure on a regular basis.  If it is consistently greater than 140/90, please make an office appointment.  She appears well, in no apparent distress.  Alert and oriented times three, pleasant and cooperative. Vital signs are as documented in vital signs section.  Please check your blood pressure on a regular basis.  If it is consistently greater than 150/90, please make an office appointment.

## 2017-07-02 NOTE — Progress Notes (Signed)
Subjective:    Patient ID: RASHAN PATIENT, female    DOB: [birth year], 77 y.o.   MRN: [id number]  HPI  Lab Results  Component Value Date   HGBA1C 7.2 07/02/2017    Wt Readings from Last 3 Encounters:  07/02/17 238 lb (108 kg)  04/21/17 242 lb 3.2 oz (109.9 kg)  12/23/16 236 lb 6.4 oz (39.38 kg)  77 year old patient who is seen today for follow-up of type 2 diabetes.  Her hemoglobin A1c has increased from 7.6-7.2.  She generally feels well Due to cost considerations she uses Januvia on a as needed basis for elevated blood sugars;  there has been some modest weight loss.  No recent lab Patient is also seen today for pain management evaluation per opioid protocol.  [place] printed, initialed  and scanned into the EMR. New control drug.  Pain contract reviewed and signed  Indication for chronic opioid: patient has chronic low back pain is followed by orthopedics.  She also has osteoarthritis with hip and leg pain.  She still works as a Training and development officer at [employer].  Tramadol has not been effective and not well tolerated in the past Medication and dose: hydrocodone 5-acetaminophen 325 mg daily.  Takes twice daily # pills per month: 60 Last UDS date: 04/21/2017 Pain contract signed (Y/N): yes Date narcotic database last reviewed (include red flags):  July 02, 2017   Past Medical History:  Diagnosis Date  . ALLERGIC RHINITIS 06/17/2010  . ANEMIA-NOS 06/17/2010  . BREAST CANCER, HX OF 06/17/2010  . Cancer [facility]) 2008   right breast  . Cataract   . COPD 06/17/2010  . DIABETES MELLITUS, TYPE II 06/17/2010  . Endometriosis   . Fibroid    FIBROID  . HYPERLIPIDEMIA 06/17/2010  . HYPERTENSION 06/17/2010  . Leg swelling   . LOW BACK PAIN 06/17/2010  . OSTEOARTHRITIS 06/17/2010  . OSTEOPENIA 06/17/2010  . Weakness      Social History   Socioeconomic History  . Marital status: Married    Spouse name: Not on file  . Number of children: Not on file  .  Years of education: Not on file  . Highest education level: Not on file  Social Needs  . Financial resource strain: Not on file  . Food insecurity - worry: Not on file  . Food insecurity - inability: Not on file  . Transportation needs - medical: Not on file  . Transportation needs - non-medical: Not on file  Occupational History  . Not on file  Tobacco Use  . Smoking status: Never Smoker  . Smokeless tobacco: Never Used  Substance and Sexual Activity  . Alcohol use: No  . Drug use: No  . Sexual activity: No    Birth control/protection: Post-menopausal, Surgical  Other Topics Concern  . Not on file  Social History Narrative  . Not on file    Past Surgical History:  Procedure Laterality Date  . ABDOMINAL HYSTERECTOMY  1988   TAH,LSO  . BREAST LUMPECTOMY  2007   LUMPECTOMY FOLLOWED BY RADIATION  . OOPHORECTOMY  1988   TAH,LSO  . TUBAL LIGATION      Family History  Problem Relation Age of Onset  . Diabetes Mother   . Hypertension Sister   . Asthma Father   . Diabetes Brother     No Known Allergies  Current Outpatient Medications on File Prior to Visit  Medication Sig Dispense Refill  . amLODipine-benazepril (LOTREL) 5-40 MG capsule TAKE ONE CAPSULE  BY MOUTH EVERY DAY 90 capsule 1  . Ascorbic Acid (VITAMIN C) 1000 MG tablet Take 1,000 mg by mouth 2 (two) times daily.    . Azelastine-Fluticasone 137-50 MCG/ACT SUSP Place 1 spray into both nostrils daily. 1 Bottle 2  . Cholecalciferol (VITAMIN D) 2000 UNITS CAPS Take 1 capsule by mouth daily. Taking 2 caps daily 4,000    . cyanocobalamin 2000 MCG tablet Take 2,000 mcg by mouth daily.      . cyclobenzaprine (FLEXERIL) 5 MG tablet Take 1 tablet (5 mg total) by mouth at bedtime. 60 tablet 3  . FERREX 150 150 MG capsule TAKE ONE CAPSULE BY MOUTH EVERY DAY 30 capsule 5  . fexofenadine (ALLEGRA) 180 MG tablet Take 180 mg by mouth daily as needed.     . furosemide (LASIX) 20 MG tablet Take 1 tablet (20 mg total) by mouth  daily as needed. 30 tablet 1  . glipiZIDE (GLUCOTROL XL) 5 MG 24 hr tablet TAKE 1 TABLET BY MOUTH EVERY DAY 90 tablet 3  . hydrochlorothiazide (HYDRODIURIL) 25 MG tablet TAKE 1 TABLET BY MOUTH EVERY DAY 90 tablet 1  . HYDROcodone-acetaminophen (NORCO/VICODIN) 5-325 MG tablet Take 1 tablet by mouth every 6 (six) hours as needed. 60 tablet 0  . Lancets 30G MISC USE TWICE DAILY AS DIRECTED FOR GLUCOSE TESTING AND MONITORING 100 each 4  . Magnesium 250 MG TABS Take 1 tablet by mouth as needed.     . metFORMIN (GLUCOPHAGE) 1000 MG tablet TAKE 1 TABLET BY MOUTH TWICE A DAY WITH FOOD 180 tablet 1  . metoprolol succinate (TOPROL-XL) 50 MG 24 hr tablet TAKE 1 TABLET BY MOUTH EVERY MORNING. TAKE WITH OR IMMEDIATELY FOLLOWING A MEAL. 90 tablet 3  . Multiple Vitamin (MULTIVITAMIN) tablet Take 1 tablet by mouth daily.      Marland Kitchen ONE TOUCH LANCETS MISC Test twice daily. 200 each 5  . ONETOUCH VERIO test strip TEST TWICE A DAY 200 each 5  . pioglitazone (ACTOS) 45 MG tablet TAKE 1 TABLET BY MOUTH EVERY DAY 90 tablet 1  . pravastatin (PRAVACHOL) 40 MG tablet Take 1 tablet (40 mg total) by mouth daily. 90 tablet 1  . sitaGLIPtin (JANUVIA) 100 MG tablet Take 1 tablet (100 mg total) by mouth daily. 30 tablet 5  . triamcinolone cream (KENALOG) 0.1 % Apply 1 application topically 2 (two) times daily. 30 g 3   No current facility-administered medications on file prior to visit.     BP (!) 142/70 (BP Location: Left Arm, Patient Position: Sitting, Cuff Size: Normal)   Pulse 88   Temp (!) 97.5 F (36.4 C) (Oral)   Ht 5\' 1"  (1.549 m)   Wt 238 lb (108 kg)   SpO2 96%   BMI 44.97 kg/m      Review of Systems  Constitutional: Negative.   HENT: Negative for congestion, dental problem, hearing loss, rhinorrhea, sinus pressure, sore throat and tinnitus.   Eyes: Negative for pain, discharge and visual disturbance.  Respiratory: Negative for cough and shortness of breath.   Cardiovascular: Negative for chest pain,  palpitations and leg swelling.  Gastrointestinal: Negative for abdominal distention, abdominal pain, blood in stool, constipation, diarrhea, nausea and vomiting.  Genitourinary: Negative for difficulty urinating, dysuria, flank pain, frequency, hematuria, pelvic pain, urgency, vaginal bleeding, vaginal discharge and vaginal pain.  Musculoskeletal: Positive for arthralgias and back pain. Negative for gait problem and joint swelling.  Skin: Negative for rash.  Neurological: Positive for numbness. Negative for dizziness, syncope, speech  difficulty, weakness and headaches.  Hematological: Negative for adenopathy.  Psychiatric/Behavioral: Negative for agitation, behavioral problems and dysphoric mood. The patient is not nervous/anxious.        Objective:   Physical Exam  Constitutional: She is oriented to person, place, and time. She appears well-developed and well-nourished.  Repeat blood pressure 122/74 Weight 238.  Down modestly  HENT:  Head: Normocephalic.  Right Ear: External ear normal.  Left Ear: External ear normal.  Mouth/Throat: Oropharynx is clear and moist.  Eyes: Conjunctivae and EOM are normal. Pupils are equal, round, and reactive to light.  Neck: Normal range of motion. Neck supple. No thyromegaly present.  Cardiovascular: Normal rate, regular rhythm, normal heart sounds and intact distal pulses.  Pulmonary/Chest: Effort normal and breath sounds normal.  Abdominal: Soft. Bowel sounds are normal. She exhibits no mass. There is no tenderness.  Musculoskeletal: Normal range of motion.  Lymphadenopathy:    She has no cervical adenopathy.  Neurological: She is alert and oriented to person, place, and time.  Skin: Skin is warm and dry. No rash noted.  Psychiatric: She has a normal mood and affect. Her behavior is normal.          Assessment & Plan:  Diabetes mellitus.  Weight loss more exercise encouraged. Will check updated lab including lipid profile and urine for  microalbumin  Essential hypertension stable  Encounter for chronic pain management (G89.29) Narcotic use  (711.90) Pain management contract signed (Z02.89)  Return in 3 months for follow-up  Nyoka Cowden

## 2017-07-03 LAB — HEPATITIS C ANTIBODY
HEP C AB: NONREACTIVE
SIGNAL TO CUT-OFF: 0.01 (ref <1.00)

## 2017-07-14 DIAGNOSIS — J3089 Other allergic rhinitis: Secondary | ICD-10-CM | POA: Diagnosis not present

## 2017-07-14 DIAGNOSIS — J301 Allergic rhinitis due to pollen: Secondary | ICD-10-CM | POA: Diagnosis not present

## 2017-08-07 DIAGNOSIS — J301 Allergic rhinitis due to pollen: Secondary | ICD-10-CM | POA: Diagnosis not present

## 2017-08-07 DIAGNOSIS — J3089 Other allergic rhinitis: Secondary | ICD-10-CM | POA: Diagnosis not present

## 2017-08-21 DIAGNOSIS — R69 Illness, unspecified: Secondary | ICD-10-CM | POA: Diagnosis not present

## 2017-08-24 DIAGNOSIS — Z853 Personal history of malignant neoplasm of breast: Secondary | ICD-10-CM | POA: Diagnosis not present

## 2017-08-24 DIAGNOSIS — Z1231 Encounter for screening mammogram for malignant neoplasm of breast: Secondary | ICD-10-CM | POA: Diagnosis not present

## 2017-08-27 DIAGNOSIS — J301 Allergic rhinitis due to pollen: Secondary | ICD-10-CM | POA: Diagnosis not present

## 2017-08-27 DIAGNOSIS — J3089 Other allergic rhinitis: Secondary | ICD-10-CM | POA: Diagnosis not present

## 2017-09-10 ENCOUNTER — Encounter: Payer: Self-pay | Admitting: Internal Medicine

## 2017-09-10 ENCOUNTER — Ambulatory Visit (INDEPENDENT_AMBULATORY_CARE_PROVIDER_SITE_OTHER): Payer: Medicare HMO | Admitting: Internal Medicine

## 2017-09-10 VITALS — BP 130/82 | HR 82 | Temp 97.8°F | Wt 239.0 lb

## 2017-09-10 DIAGNOSIS — M545 Low back pain, unspecified: Secondary | ICD-10-CM

## 2017-09-10 DIAGNOSIS — M150 Primary generalized (osteo)arthritis: Secondary | ICD-10-CM

## 2017-09-10 DIAGNOSIS — M159 Polyosteoarthritis, unspecified: Secondary | ICD-10-CM

## 2017-09-10 DIAGNOSIS — G8929 Other chronic pain: Secondary | ICD-10-CM

## 2017-09-10 MED ORDER — HYDROCODONE-ACETAMINOPHEN 5-325 MG PO TABS: 1.0000 | ORAL_TABLET | Freq: Four times a day (QID) | ORAL | 0 refills | Status: DC | PRN

## 2017-09-10 NOTE — Patient Instructions (Signed)
Limit your sodium (Salt) intake   Please check your hemoglobin A1c every 3 months    It is important that you exercise regularly, at least 20 minutes 3 to 4 times per week.  If you develop chest pain or shortness of breath seek  medical attention.  You need to lose weight.  Consider a lower calorie diet and regular exercise. 

## 2017-09-10 NOTE — Progress Notes (Signed)
   Subjective:    Patient ID: ANNETA ROUNDS, female    DOB: 1940-10-22, 77 y.o.   MRN: [id number]  HPI  77 year old patient who is seen today for pain management evaluation per opioid protocol.  [place] printed, initialed  and scanned into the EMR. New control drug.  Pain contract reviewed and signed  Indication for chronic opioid: patient has chronic low back pain is followed by orthopedics.  She also has osteoarthritis with hip and leg pain.  She still works as a Training and development officer at [employer].  Tramadol has not been effective and not well tolerated in the past Medication and dose: hydrocodone 5-acetaminophen 325 mg daily.  Takes twice daily # pills per month: 60 Last UDS date: 0/23/2018 Pain contract signed (Y/N): yes Date narcotic database last reviewed (include red flags):  September 10, 2017   Review of Systems     Objective:   Physical Exam        Assessment & Plan:   Encounter for chronic pain management (G89.29) Narcotic use  (711.90) Pain management contract signed (Z02.89)  Nyoka Cowden

## 2017-09-15 DIAGNOSIS — J301 Allergic rhinitis due to pollen: Secondary | ICD-10-CM | POA: Diagnosis not present

## 2017-09-15 DIAGNOSIS — J3089 Other allergic rhinitis: Secondary | ICD-10-CM | POA: Diagnosis not present

## 2017-09-17 DIAGNOSIS — H01024 Squamous blepharitis left upper eyelid: Secondary | ICD-10-CM | POA: Diagnosis not present

## 2017-09-17 DIAGNOSIS — H01021 Squamous blepharitis right upper eyelid: Secondary | ICD-10-CM | POA: Diagnosis not present

## 2017-09-17 DIAGNOSIS — H01022 Squamous blepharitis right lower eyelid: Secondary | ICD-10-CM | POA: Diagnosis not present

## 2017-09-17 DIAGNOSIS — H04123 Dry eye syndrome of bilateral lacrimal glands: Secondary | ICD-10-CM | POA: Diagnosis not present

## 2017-09-17 DIAGNOSIS — H1851 Endothelial corneal dystrophy: Secondary | ICD-10-CM | POA: Diagnosis not present

## 2017-09-17 DIAGNOSIS — H25813 Combined forms of age-related cataract, bilateral: Secondary | ICD-10-CM | POA: Diagnosis not present

## 2017-09-17 DIAGNOSIS — H01025 Squamous blepharitis left lower eyelid: Secondary | ICD-10-CM | POA: Diagnosis not present

## 2017-10-08 DIAGNOSIS — J301 Allergic rhinitis due to pollen: Secondary | ICD-10-CM | POA: Diagnosis not present

## 2017-10-08 DIAGNOSIS — J3089 Other allergic rhinitis: Secondary | ICD-10-CM | POA: Diagnosis not present

## 2017-10-12 DIAGNOSIS — H1711 Central corneal opacity, right eye: Secondary | ICD-10-CM | POA: Diagnosis not present

## 2017-10-12 DIAGNOSIS — H04123 Dry eye syndrome of bilateral lacrimal glands: Secondary | ICD-10-CM | POA: Diagnosis not present

## 2017-10-12 DIAGNOSIS — H25813 Combined forms of age-related cataract, bilateral: Secondary | ICD-10-CM | POA: Diagnosis not present

## 2017-10-12 DIAGNOSIS — H1851 Endothelial corneal dystrophy: Secondary | ICD-10-CM | POA: Diagnosis not present

## 2017-10-17 ENCOUNTER — Other Ambulatory Visit: Payer: Self-pay | Admitting: Internal Medicine

## 2017-10-24 ENCOUNTER — Other Ambulatory Visit: Payer: Self-pay | Admitting: Internal Medicine

## 2017-10-28 ENCOUNTER — Other Ambulatory Visit: Payer: Self-pay | Admitting: Internal Medicine

## 2017-10-28 DIAGNOSIS — J3089 Other allergic rhinitis: Secondary | ICD-10-CM | POA: Diagnosis not present

## 2017-10-28 DIAGNOSIS — R69 Illness, unspecified: Secondary | ICD-10-CM | POA: Diagnosis not present

## 2017-10-28 DIAGNOSIS — J301 Allergic rhinitis due to pollen: Secondary | ICD-10-CM | POA: Diagnosis not present

## 2017-11-17 ENCOUNTER — Other Ambulatory Visit: Payer: Self-pay | Admitting: Internal Medicine

## 2017-11-18 DIAGNOSIS — J3089 Other allergic rhinitis: Secondary | ICD-10-CM | POA: Diagnosis not present

## 2017-11-18 DIAGNOSIS — J301 Allergic rhinitis due to pollen: Secondary | ICD-10-CM | POA: Diagnosis not present

## 2017-12-03 DIAGNOSIS — J3081 Allergic rhinitis due to animal (cat) (dog) hair and dander: Secondary | ICD-10-CM | POA: Diagnosis not present

## 2017-12-03 DIAGNOSIS — J301 Allergic rhinitis due to pollen: Secondary | ICD-10-CM | POA: Diagnosis not present

## 2017-12-07 DIAGNOSIS — J301 Allergic rhinitis due to pollen: Secondary | ICD-10-CM | POA: Diagnosis not present

## 2017-12-07 DIAGNOSIS — J3089 Other allergic rhinitis: Secondary | ICD-10-CM | POA: Diagnosis not present

## 2017-12-11 ENCOUNTER — Ambulatory Visit (INDEPENDENT_AMBULATORY_CARE_PROVIDER_SITE_OTHER): Payer: Medicare HMO | Admitting: Orthopaedic Surgery

## 2017-12-11 ENCOUNTER — Ambulatory Visit (INDEPENDENT_AMBULATORY_CARE_PROVIDER_SITE_OTHER): Payer: Medicare HMO

## 2017-12-11 ENCOUNTER — Encounter (INDEPENDENT_AMBULATORY_CARE_PROVIDER_SITE_OTHER): Payer: Self-pay | Admitting: Orthopaedic Surgery

## 2017-12-11 VITALS — BP 155/78 | HR 90 | Ht 62.0 in | Wt 239.0 lb

## 2017-12-11 DIAGNOSIS — M4807 Spinal stenosis, lumbosacral region: Secondary | ICD-10-CM | POA: Diagnosis not present

## 2017-12-11 DIAGNOSIS — G8929 Other chronic pain: Secondary | ICD-10-CM | POA: Diagnosis not present

## 2017-12-11 DIAGNOSIS — M545 Low back pain: Secondary | ICD-10-CM

## 2017-12-12 ENCOUNTER — Other Ambulatory Visit: Payer: Self-pay | Admitting: Internal Medicine

## 2017-12-14 ENCOUNTER — Encounter (INDEPENDENT_AMBULATORY_CARE_PROVIDER_SITE_OTHER): Payer: Self-pay | Admitting: Orthopaedic Surgery

## 2017-12-14 ENCOUNTER — Ambulatory Visit (INDEPENDENT_AMBULATORY_CARE_PROVIDER_SITE_OTHER): Payer: Medicare HMO | Admitting: Internal Medicine

## 2017-12-14 ENCOUNTER — Encounter: Payer: Self-pay | Admitting: Internal Medicine

## 2017-12-14 VITALS — BP 102/60 | HR 113 | Temp 98.8°F | Wt 233.0 lb

## 2017-12-14 DIAGNOSIS — E119 Type 2 diabetes mellitus without complications: Secondary | ICD-10-CM | POA: Diagnosis not present

## 2017-12-14 DIAGNOSIS — G8929 Other chronic pain: Secondary | ICD-10-CM | POA: Diagnosis not present

## 2017-12-14 DIAGNOSIS — M545 Low back pain: Secondary | ICD-10-CM | POA: Diagnosis not present

## 2017-12-14 LAB — POCT GLYCOSYLATED HEMOGLOBIN (HGB A1C): Hemoglobin A1C: 6.5 % — AB (ref 4.0–5.6)

## 2017-12-14 MED ORDER — HYDROCODONE-ACETAMINOPHEN 5-325 MG PO TABS: 1.0000 | ORAL_TABLET | Freq: Four times a day (QID) | ORAL | 0 refills | Status: DC | PRN

## 2017-12-14 NOTE — Progress Notes (Signed)
   Subjective:    Patient ID: Doris Jackson, female    DOB: [birth year], 77 y.o.   MRN: [id number]  HPI  77 year old patient who is seen today for follow-up for chronic pain management per opioid protocol.  She has been seen by orthopedics last week and is scheduled for a follow-up lumbar MRI.  She is being considered for epidurals  [place] printed, initialed  and scanned into the EMR.  Indication for chronic opioid: She has chronic low back pain.  This has not responded well to tramadol. Medication and dose: She remains on hydrocodone 5 with acetaminophen twice daily # pills per month: Limited to 60/month Last UDS date: October 2018 Opioid Treatment Agreement signed (Y/N): Yes Opioid Treatment Agreement last reviewed with patient:  September 10, 2017 [place] reviewed this encounter (include red flags):  Yes  Patient has type 2 diabetes.  Hemoglobin A1c today improved at 6.5  Review of Systems     Objective:   Physical Exam  Constitutional: She appears well-developed and well-nourished. No distress.   Blood pressure 120/70 weight 233          Assessment & Plan:   Encounter for chronic pain management (G89.29) Narcotic use  (711.90) Pain management contract signed (Z02.89)  Chronic low back pain Diabetes mellitus type 2.  No change in medical regimen  Follow-up 3 months  Marletta Lor

## 2017-12-14 NOTE — Patient Instructions (Signed)
Limit your sodium (Salt) intake   Please check your hemoglobin A1c every 3 months    It is important that you exercise regularly, at least 20 minutes 3 to 4 times per week.  If you develop chest pain or shortness of breath seek  medical attention.  You need to lose weight.  Consider a lower calorie diet and regular exercise. 

## 2017-12-14 NOTE — Progress Notes (Signed)
Office Visit Note   Patient: Doris Jackson           Date of Birth: June 18, 1941           MRN: [id number] Visit Date: 12/11/2017              Requested by: Marletta Lor, MD [place], [place] [id number] PCP: Marletta Lor, MD   Assessment & Plan: Visit Diagnoses:  1. Chronic bilateral low back pain, with sciatica presence unspecified   2. Spinal stenosis of lumbosacral region     Plan: Patient has progressive symptoms with radiographs demonstrating L4-5 degenerative facet changes and progressive anterolisthesis.  She has neurogenic claudication symptoms with standing and walking.  We will proceed with new MRI scan office follow-up after scan for review.  Pathophysiology discussed.  Follow-Up Instructions: No follow-ups on file.   Orders:  Orders Placed This Encounter  Procedures  . XR Lumbar Spine 2-3 Views  . MR Lumbar Spine w/o contrast   No orders of the defined types were placed in this encounter.     Procedures: No procedures performed   Clinical Data: No additional findings.   Subjective: Chief Complaint  Patient presents with  . Lower Back - Pain    HPI 77 year old female new patient visit for chronic low back pain.  She states she has worse pain in the morning is better when she is up but has increased pain when she is walking.  She still works part-time as a Psychologist, counselling.  She has increased pain with bending and lifting.  She has been using hydrocodone 5/325 1 tablet p.o. twice daily from her PCP.  She has back pain right leg pain with right leg numbness.  After driving she has increased symptoms.  She is tried heating pad, hot shower, Flexeril, anti-inflammatories.  She had epidural injection by Dr. Alcide Evener at [facility] about 2 years ago without relief.  No associated bowel or bladder symptoms.  She does have diabetes and has had breast cancer 2007.Marland Kitchen  Patient is a non-smoker.  Patient can ambulate for about 10  minutes and then has to stop and sit down.  She gets relief leaning over a grocery cart.  Lumbar MRI scan 2009 showed anterolisthesis at L4-5 with lateral recess narrowing and narrowing of both foramina right greater than left.  Review of Systems 14 point review of systems positive for anxiety arthritis breast cancer, cataracts, diabetes type 2 on oral medication.  Positive for osteoporosis, hypertension, endometriosis, allergic rhinitis, dyslipidemia and history of cataracts.  Otherwise negative as it pertains HPI.   Objective: Vital Signs: BP (!) 155/78   Pulse 90   Ht 5\' 2"  (1.575 m)   Wt 239 lb (108.4 kg)   BMI 43.71 kg/m   Physical Exam  Constitutional: She is oriented to person, place, and time. She appears well-developed.  HENT:  Head: Normocephalic.  Right Ear: External ear normal.  Left Ear: External ear normal.  Eyes: Pupils are equal, round, and reactive to light.  Neck: No tracheal deviation present. No thyromegaly present.  Cardiovascular: Normal rate.  Pulmonary/Chest: Effort normal.  Abdominal: Soft.  Truncal obesity  Neurological: She is alert and oriented to person, place, and time.  Skin: Skin is warm and dry.  Psychiatric: She has a normal mood and affect. Her behavior is normal.    Ortho Exam distal pulses are palpable.  She has sciatic notch tenderness on the right anterior tib gastrocsoleus EHL are strong.  Normal hip range of motion SI joint test is negative.  Pain with straight leg raising 80 degrees on the right negative on the left.  Normal upper extremity reflexes.  No lower extremity clonus.  Quads hip flexors are strong hamstrings test normal.  Specialty Comments:  No specialty comments available.  Imaging: No results found.   PMFS History: Patient Active Problem List   Diagnosis Date Noted  . Cataract   . Cancer ([place])   . Fibroid   . Endometriosis   . Diabetes mellitus without complication ([place]) 49/44/9675  . Dyslipidemia 06/17/2010  .  ANEMIA-NOS 06/17/2010  . Essential hypertension 06/17/2010  . Allergic rhinitis 06/17/2010  . COPD 06/17/2010  . Osteoarthritis 06/17/2010  . LOW BACK PAIN 06/17/2010  . OSTEOPENIA 06/17/2010  . BREAST CANCER, HX OF 06/17/2010   Past Medical History:  Diagnosis Date  . ALLERGIC RHINITIS 06/17/2010  . ANEMIA-NOS 06/17/2010  . BREAST CANCER, HX OF 06/17/2010  . Cancer [facility]) 2008   right breast  . Cataract   . COPD 06/17/2010  . DIABETES MELLITUS, TYPE II 06/17/2010  . Endometriosis   . Fibroid    FIBROID  . HYPERLIPIDEMIA 06/17/2010  . HYPERTENSION 06/17/2010  . Leg swelling   . LOW BACK PAIN 06/17/2010  . OSTEOARTHRITIS 06/17/2010  . OSTEOPENIA 06/17/2010  . Weakness     Family History  Problem Relation Age of Onset  . Diabetes Mother   . Hypertension Sister   . Asthma Father   . Diabetes Brother     Past Surgical History:  Procedure Laterality Date  . ABDOMINAL HYSTERECTOMY  1988   TAH,LSO  . BREAST LUMPECTOMY  2007   LUMPECTOMY FOLLOWED BY RADIATION  . OOPHORECTOMY  1988   TAH,LSO  . TUBAL LIGATION     Social History   Occupational History  . Not on file  Tobacco Use  . Smoking status: Never Smoker  . Smokeless tobacco: Never Used  Substance and Sexual Activity  . Alcohol use: No  . Drug use: No  . Sexual activity: Never    Birth control/protection: Post-menopausal, Surgical

## 2017-12-15 ENCOUNTER — Other Ambulatory Visit: Payer: Self-pay | Admitting: Internal Medicine

## 2017-12-17 ENCOUNTER — Other Ambulatory Visit: Payer: Self-pay | Admitting: Internal Medicine

## 2017-12-23 ENCOUNTER — Ambulatory Visit
Admission: RE | Admit: 2017-12-23 | Discharge: 2017-12-23 | Disposition: A | Discharge status: Home / Self Care | Payer: Medicare HMO | Source: Ambulatory Visit | Attending: Orthopaedic Surgery | Admitting: Orthopaedic Surgery

## 2017-12-23 DIAGNOSIS — M4807 Spinal stenosis, lumbosacral region: Secondary | ICD-10-CM

## 2017-12-23 DIAGNOSIS — M48061 Spinal stenosis, lumbar region without neurogenic claudication: Secondary | ICD-10-CM | POA: Diagnosis not present

## 2017-12-25 DIAGNOSIS — J3089 Other allergic rhinitis: Secondary | ICD-10-CM | POA: Diagnosis not present

## 2017-12-25 DIAGNOSIS — T781XXA Other adverse food reactions, not elsewhere classified, initial encounter: Secondary | ICD-10-CM | POA: Diagnosis not present

## 2017-12-30 DIAGNOSIS — J3089 Other allergic rhinitis: Secondary | ICD-10-CM | POA: Diagnosis not present

## 2017-12-30 DIAGNOSIS — J301 Allergic rhinitis due to pollen: Secondary | ICD-10-CM | POA: Diagnosis not present

## 2018-01-01 ENCOUNTER — Other Ambulatory Visit: Payer: Self-pay | Admitting: Internal Medicine

## 2018-01-04 DIAGNOSIS — R69 Illness, unspecified: Secondary | ICD-10-CM | POA: Diagnosis not present

## 2018-01-05 ENCOUNTER — Ambulatory Visit (INDEPENDENT_AMBULATORY_CARE_PROVIDER_SITE_OTHER): Payer: Medicare HMO | Admitting: Orthopaedic Surgery

## 2018-01-05 ENCOUNTER — Encounter (INDEPENDENT_AMBULATORY_CARE_PROVIDER_SITE_OTHER): Payer: Self-pay | Admitting: Orthopaedic Surgery

## 2018-01-05 VITALS — BP 136/83 | HR 91 | Ht 62.0 in | Wt 239.0 lb

## 2018-01-05 DIAGNOSIS — M48062 Spinal stenosis, lumbar region with neurogenic claudication: Secondary | ICD-10-CM | POA: Diagnosis not present

## 2018-01-05 NOTE — Progress Notes (Signed)
Office Visit Note   Patient: Doris Jackson           Date of Birth: March 04, 1941           MRN: [id number] Visit Date: 01/05/2018              Requested by: Marletta Lor, MD [place], [place] [address] PCP: Marletta Lor, MD   Assessment & Plan: Visit Diagnoses:  1. Spinal stenosis of lumbar region with neurogenic claudication     Plan: We discussed operative intervention with decompression surgery.  She will return in 3 months we will obtain a lateral flexion-extension lumbar films to evaluate her for instability L4-5 since her anterolisthesis has progressed from 1 mm to 4 mm in the last 10 years.  We can discuss surgical options at that time.  Follow-Up Instructions: Return in about 3 months (around 04/07/2018).   Orders:  No orders of the defined types were placed in this encounter.  No orders of the defined types were placed in this encounter.     Procedures: No procedures performed   Clinical Data: No additional findings.   Subjective: Chief Complaint  Patient presents with  . Lower Back - Pain, Follow-up    MRI Lumbar review    HPI 77 year old female returns with back pain leg weakness with standing.  She can walk about half a block she gets relief with sitting for about 5 minutes and then is able to repeat.  Does better leaning on a grocery cart.  MRI scan is available for review.  Previous MRI 2009 showed some disc degeneration and narrowing.  Current MRI shows over the last 10 years significant progression at L4-5 with shifting of 4 mm and severe stenosis and moderate by foraminal stenosis.  Some mild to moderate stenosis at 3- 4 and also L2-3.  The bottom L5-S1 level shows no compression.  Review of Systems 14 point review of systems updated unchanged from 12/11/2017 office visit.   Objective: Vital Signs: BP 136/83   Pulse 91   Ht 5\' 2"  (1.575 m)   Wt 239 lb (108.4 kg)   BMI 43.71 kg/m   Physical Exam  Constitutional:  She is oriented to person, place, and time. She appears well-developed.  HENT:  Head: Normocephalic.  Right Ear: External ear normal.  Left Ear: External ear normal.  Eyes: Pupils are equal, round, and reactive to light.  Neck: No tracheal deviation present. No thyromegaly present.  Cardiovascular: Normal rate.  Pulmonary/Chest: Effort normal.  Abdominal: Soft.  Neurological: She is alert and oriented to person, place, and time.  Skin: Skin is warm and dry.  Psychiatric: She has a normal mood and affect. Her behavior is normal.    Ortho Exam anterior tib gastrocsoleus is strong no pain with hip range of motion.  Knee and ankle jerk are intact.  Some pain with straight leg raising on the right at 80 degrees and negative to 90 degrees on the left.  Good upper extremity reflexes with symmetrical.  Pedal pulses are intact.  Specialty Comments:  No specialty comments available.  Imaging: CLINICAL DATA:  Injured 6 years ago with persistent low back pain radiating to the right leg.  EXAM: MRI LUMBAR SPINE WITHOUT CONTRAST  TECHNIQUE: Multiplanar, multisequence MR imaging of the lumbar spine was performed. No intravenous contrast was administered.  COMPARISON:  Radiography 12/11/2017.  MRI 11/02/2007.  FINDINGS: Segmentation:  5 lumbar type vertebral bodies.  Alignment:  4 mm anterolisthesis  L4-5.  Vertebrae:  No fracture or primary bone lesion.  Conus medullaris and cauda equina: Conus extends to the L1 level. Conus and cauda equina appear intrinsically normal. Compressive findings as below.  Paraspinal and other soft tissues: No significant finding.  Disc levels:  At T10-11 and T11-12, there are shallow disc protrusions at encroach upon the canal, effacing the subarachnoid space surrounding the cord but not definitely compressing the cord. There is facet degeneration and hypertrophy at these levels as well. These levels were not studied in the axial  plane.  T12-L1: Minor disc bulge. Mild facet hypertrophy. No compressive stenosis.  L1-2: Disc bulge with a shallow left posterolateral herniation. Mild facet and ligamentous hypertrophy. Mild narrowing of the left lateral recess without distinct neural compression at this level.  L2-3: Endplate osteophytes and broad-based disc herniation. Facet and ligamentous hypertrophy. Multifactorial spinal stenosis at this level, effacing the subarachnoid space with crowding of the nerve roots and likely neural compression.  L3-4: Endplate osteophytes and broad-based disc herniation. Facet and ligamentous hypertrophy. Moderate multifactorial stenosis of the canal that could be symptomatic. Foraminal stenosis right worse than left because of disc material likely to affect either or both L3 nerves.  L4-5: Bilateral facet arthropathy with 4 mm anterolisthesis. Disc degeneration with broad-based herniation. Severe multifactorial stenosis likely to cause neural compression. Bilateral foraminal stenosis likely to compress the L4 nerves. This is worse on the right.  L5-S1: Bulging of the disc. Bilateral facet degeneration and hypertrophy worse on the left. No compressive central canal stenosis. Foraminal narrowing worse on the left with some potential to affect in particular the left L5 nerve.  Compared to the study of 2009, findings in general of worsened significantly.  IMPRESSION: Marked worsening of disease since 2009.  L1-2: Disc bulge with shallow left posterolateral herniation. Mild stenosis of the left lateral recess without definite neural compression.  L2-3: Moderate to severe multifactorial spinal stenosis due to broad-based herniation of disc material in combination with facet and ligamentous hypertrophy. Neural compression likely at this level.  L3-4: Moderate multifactorial spinal stenosis due 2 osteophytes, disc herniation and facet hypertrophy. Foraminal disc  material right more than left could also affect either L3 nerve.  L4-5: Degenerative anterolisthesis of 4 mm. Broad-based herniation of disc material. Severe multifactorial spinal stenosis affecting the canal and neural foramina. Findings at this level quite likely to be significant.  L5-S1: Facet arthropathy left worse than right. Bulging of the disc. Left foraminal narrowing could affect the exiting L5 nerve.   Electronically Signed   By: Nelson Chimes M.D.   On: 12/24/2017 07:52   PMFS History: Patient Active Problem List   Diagnosis Date Noted  . Cataract   . Cancer ([place])   . Fibroid   . Endometriosis   . Diabetes mellitus without complication ([place]) 58/52/7782  . Dyslipidemia 06/17/2010  . ANEMIA-NOS 06/17/2010  . Essential hypertension 06/17/2010  . Allergic rhinitis 06/17/2010  . COPD 06/17/2010  . Osteoarthritis 06/17/2010  . LOW BACK PAIN 06/17/2010  . OSTEOPENIA 06/17/2010  . BREAST CANCER, HX OF 06/17/2010   Past Medical History:  Diagnosis Date  . ALLERGIC RHINITIS 06/17/2010  . ANEMIA-NOS 06/17/2010  . BREAST CANCER, HX OF 06/17/2010  . Cancer [facility]) 2008   right breast  . Cataract   . COPD 06/17/2010  . DIABETES MELLITUS, TYPE II 06/17/2010  . Endometriosis   . Fibroid    FIBROID  . HYPERLIPIDEMIA 06/17/2010  . HYPERTENSION 06/17/2010  . Leg swelling   .  LOW BACK PAIN 06/17/2010  . OSTEOARTHRITIS 06/17/2010  . OSTEOPENIA 06/17/2010  . Weakness     Family History  Problem Relation Age of Onset  . Diabetes Mother   . Hypertension Sister   . Asthma Father   . Diabetes Brother     Past Surgical History:  Procedure Laterality Date  . ABDOMINAL HYSTERECTOMY  1988   TAH,LSO  . BREAST LUMPECTOMY  2007   LUMPECTOMY FOLLOWED BY RADIATION  . OOPHORECTOMY  1988   TAH,LSO  . TUBAL LIGATION     Social History   Occupational History  . Not on file  Tobacco Use  . Smoking status: Never Smoker  . Smokeless tobacco: Never Used  Substance  and Sexual Activity  . Alcohol use: No  . Drug use: No  . Sexual activity: Never    Birth control/protection: Post-menopausal, Surgical

## 2018-01-06 DIAGNOSIS — R32 Unspecified urinary incontinence: Secondary | ICD-10-CM | POA: Diagnosis not present

## 2018-01-06 DIAGNOSIS — R609 Edema, unspecified: Secondary | ICD-10-CM | POA: Diagnosis not present

## 2018-01-06 DIAGNOSIS — E1140 Type 2 diabetes mellitus with diabetic neuropathy, unspecified: Secondary | ICD-10-CM | POA: Diagnosis not present

## 2018-01-06 DIAGNOSIS — I10 Essential (primary) hypertension: Secondary | ICD-10-CM | POA: Diagnosis not present

## 2018-01-06 DIAGNOSIS — D649 Anemia, unspecified: Secondary | ICD-10-CM | POA: Diagnosis not present

## 2018-01-06 DIAGNOSIS — M1990 Unspecified osteoarthritis, unspecified site: Secondary | ICD-10-CM | POA: Diagnosis not present

## 2018-01-06 DIAGNOSIS — E785 Hyperlipidemia, unspecified: Secondary | ICD-10-CM | POA: Diagnosis not present

## 2018-01-06 DIAGNOSIS — J309 Allergic rhinitis, unspecified: Secondary | ICD-10-CM | POA: Diagnosis not present

## 2018-01-06 DIAGNOSIS — G8929 Other chronic pain: Secondary | ICD-10-CM | POA: Diagnosis not present

## 2018-01-07 DIAGNOSIS — J3089 Other allergic rhinitis: Secondary | ICD-10-CM | POA: Diagnosis not present

## 2018-01-07 DIAGNOSIS — J301 Allergic rhinitis due to pollen: Secondary | ICD-10-CM | POA: Diagnosis not present

## 2018-01-13 DIAGNOSIS — J301 Allergic rhinitis due to pollen: Secondary | ICD-10-CM | POA: Diagnosis not present

## 2018-01-13 DIAGNOSIS — J3089 Other allergic rhinitis: Secondary | ICD-10-CM | POA: Diagnosis not present

## 2018-01-15 DIAGNOSIS — J3089 Other allergic rhinitis: Secondary | ICD-10-CM | POA: Diagnosis not present

## 2018-01-15 DIAGNOSIS — J301 Allergic rhinitis due to pollen: Secondary | ICD-10-CM | POA: Diagnosis not present

## 2018-01-16 ENCOUNTER — Other Ambulatory Visit: Payer: Self-pay | Admitting: Internal Medicine

## 2018-01-20 DIAGNOSIS — J3089 Other allergic rhinitis: Secondary | ICD-10-CM | POA: Diagnosis not present

## 2018-01-20 DIAGNOSIS — J301 Allergic rhinitis due to pollen: Secondary | ICD-10-CM | POA: Diagnosis not present

## 2018-01-22 DIAGNOSIS — J3089 Other allergic rhinitis: Secondary | ICD-10-CM | POA: Diagnosis not present

## 2018-01-22 DIAGNOSIS — J301 Allergic rhinitis due to pollen: Secondary | ICD-10-CM | POA: Diagnosis not present

## 2018-01-25 ENCOUNTER — Other Ambulatory Visit: Payer: Self-pay | Admitting: Internal Medicine

## 2018-02-06 ENCOUNTER — Other Ambulatory Visit: Payer: Self-pay | Admitting: Internal Medicine

## 2018-02-11 DIAGNOSIS — J301 Allergic rhinitis due to pollen: Secondary | ICD-10-CM | POA: Diagnosis not present

## 2018-02-11 DIAGNOSIS — J3089 Other allergic rhinitis: Secondary | ICD-10-CM | POA: Diagnosis not present

## 2018-02-15 DIAGNOSIS — H1851 Endothelial corneal dystrophy: Secondary | ICD-10-CM | POA: Diagnosis not present

## 2018-02-15 DIAGNOSIS — H25813 Combined forms of age-related cataract, bilateral: Secondary | ICD-10-CM | POA: Diagnosis not present

## 2018-02-15 DIAGNOSIS — H04123 Dry eye syndrome of bilateral lacrimal glands: Secondary | ICD-10-CM | POA: Diagnosis not present

## 2018-02-15 DIAGNOSIS — H01024 Squamous blepharitis left upper eyelid: Secondary | ICD-10-CM | POA: Diagnosis not present

## 2018-02-15 DIAGNOSIS — H01021 Squamous blepharitis right upper eyelid: Secondary | ICD-10-CM | POA: Diagnosis not present

## 2018-02-15 DIAGNOSIS — H01025 Squamous blepharitis left lower eyelid: Secondary | ICD-10-CM | POA: Diagnosis not present

## 2018-02-15 DIAGNOSIS — E119 Type 2 diabetes mellitus without complications: Secondary | ICD-10-CM | POA: Diagnosis not present

## 2018-02-15 DIAGNOSIS — H01022 Squamous blepharitis right lower eyelid: Secondary | ICD-10-CM | POA: Diagnosis not present

## 2018-02-20 ENCOUNTER — Other Ambulatory Visit: Payer: Self-pay | Admitting: Internal Medicine

## 2018-02-25 ENCOUNTER — Encounter: Payer: Self-pay | Admitting: Internal Medicine

## 2018-02-25 ENCOUNTER — Ambulatory Visit (INDEPENDENT_AMBULATORY_CARE_PROVIDER_SITE_OTHER): Payer: Medicare HMO | Admitting: Internal Medicine

## 2018-02-25 VITALS — BP 110/60 | HR 83 | Temp 97.7°F | Wt 233.0 lb

## 2018-02-25 DIAGNOSIS — G8929 Other chronic pain: Secondary | ICD-10-CM

## 2018-02-25 DIAGNOSIS — M545 Low back pain: Secondary | ICD-10-CM | POA: Diagnosis not present

## 2018-02-25 DIAGNOSIS — M150 Primary generalized (osteo)arthritis: Secondary | ICD-10-CM | POA: Diagnosis not present

## 2018-02-25 DIAGNOSIS — M159 Polyosteoarthritis, unspecified: Secondary | ICD-10-CM

## 2018-02-25 DIAGNOSIS — I10 Essential (primary) hypertension: Secondary | ICD-10-CM | POA: Diagnosis not present

## 2018-02-25 DIAGNOSIS — E119 Type 2 diabetes mellitus without complications: Secondary | ICD-10-CM | POA: Diagnosis not present

## 2018-02-25 MED ORDER — METFORMIN HCL 1000 MG PO TABS: 1000.0000 mg | ORAL_TABLET | Freq: Two times a day (BID) | ORAL | 5 refills | Status: DC

## 2018-02-25 MED ORDER — PRAVASTATIN SODIUM 40 MG PO TABS: 40.0000 mg | ORAL_TABLET | Freq: Every day | ORAL | 4 refills | Status: DC

## 2018-02-25 MED ORDER — GLIPIZIDE ER 5 MG PO TB24: 5.0000 mg | ORAL_TABLET | Freq: Every day | ORAL | 3 refills | Status: DC

## 2018-02-25 MED ORDER — HYDROCODONE-ACETAMINOPHEN 5-325 MG PO TABS: 1.0000 | ORAL_TABLET | Freq: Four times a day (QID) | ORAL | 0 refills | Status: DC | PRN

## 2018-02-25 MED ORDER — METOPROLOL SUCCINATE ER 50 MG PO TB24: ORAL_TABLET | ORAL | 3 refills | Status: DC

## 2018-02-25 MED ORDER — FUROSEMIDE 20 MG PO TABS: 20.0000 mg | ORAL_TABLET | Freq: Every day | ORAL | 4 refills | Status: DC | PRN

## 2018-02-25 MED ORDER — SITAGLIPTIN PHOSPHATE 100 MG PO TABS: 100.0000 mg | ORAL_TABLET | Freq: Every day | ORAL | 2 refills | Status: DC

## 2018-02-25 MED ORDER — AMLODIPINE BESY-BENAZEPRIL HCL 5-40 MG PO CAPS: ORAL_CAPSULE | ORAL | 4 refills | Status: DC

## 2018-02-25 MED ORDER — HYDROCHLOROTHIAZIDE 25 MG PO TABS: 25.0000 mg | ORAL_TABLET | Freq: Every day | ORAL | 3 refills | Status: DC

## 2018-02-25 NOTE — Patient Instructions (Signed)
Please check your hemoglobin A1c every 3 months  Limit your sodium (Salt) intake  Return in 3 months for follow-up

## 2018-02-25 NOTE — Progress Notes (Signed)
Subjective:    Patient ID: Doris Jackson, female    DOB: [birth year], 77 y.o.   MRN: [id number]  HPI   Lab Results  Component Value Date   HGBA1C 6.5 (A) 12/14/2017    77 year old patient who is seen today for follow-up of type 2 diabetes she has essential hypertension and also a history of chronic low back pain. She has a history of osteoarthritis and remote history of breast cancer  She was seen 2 months ago for a pain management evaluation per opioid protocol. She has seen Dr. Lorin Mercy due to her low back pain  Past Medical History:  Diagnosis Date  . ALLERGIC RHINITIS 06/17/2010  . ANEMIA-NOS 06/17/2010  . BREAST CANCER, HX OF 06/17/2010  . Cancer [facility]) 2008   right breast  . Cataract   . COPD 06/17/2010  . DIABETES MELLITUS, TYPE II 06/17/2010  . Endometriosis   . Fibroid    FIBROID  . HYPERLIPIDEMIA 06/17/2010  . HYPERTENSION 06/17/2010  . Leg swelling   . LOW BACK PAIN 06/17/2010  . OSTEOARTHRITIS 06/17/2010  . OSTEOPENIA 06/17/2010  . Weakness      Social History   Socioeconomic History  . Marital status: Married    Spouse name: Not on file  . Number of children: Not on file  . Years of education: Not on file  . Highest education level: Not on file  Occupational History  . Not on file  Social Needs  . Financial resource strain: Not on file  . Food insecurity:    Worry: Not on file    Inability: Not on file  . Transportation needs:    Medical: Not on file    Non-medical: Not on file  Tobacco Use  . Smoking status: Never Smoker  . Smokeless tobacco: Never Used  Substance and Sexual Activity  . Alcohol use: No  . Drug use: No  . Sexual activity: Never    Birth control/protection: Post-menopausal, Surgical  Lifestyle  . Physical activity:    Days per week: Not on file    Minutes per session: Not on file  . Stress: Not on file  Relationships  . Social connections:    Talks on phone: Not on file    Gets together: Not on file    Attends  religious service: Not on file    Active member of club or organization: Not on file    Attends meetings of clubs or organizations: Not on file    Relationship status: Not on file  . Intimate partner violence:    Fear of current or ex partner: Not on file    Emotionally abused: Not on file    Physically abused: Not on file    Forced sexual activity: Not on file  Other Topics Concern  . Not on file  Social History Narrative  . Not on file    Past Surgical History:  Procedure Laterality Date  . ABDOMINAL HYSTERECTOMY  1988   TAH,LSO  . BREAST LUMPECTOMY  2007   LUMPECTOMY FOLLOWED BY RADIATION  . OOPHORECTOMY  1988   TAH,LSO  . TUBAL LIGATION      Family History  Problem Relation Age of Onset  . Diabetes Mother   . Hypertension Sister   . Asthma Father   . Diabetes Brother     No Known Allergies  Current Outpatient Medications on File Prior to Visit  Medication Sig Dispense Refill  . Ascorbic Acid (VITAMIN C) 1000 MG tablet Take 1,000 mg  by mouth 2 (two) times daily.    . Azelastine-Fluticasone 137-50 MCG/ACT SUSP Place 1 spray into both nostrils daily. 1 Bottle 2  . Cholecalciferol (VITAMIN D) 2000 UNITS CAPS Take 1 capsule by mouth daily. Taking 2 caps daily 4,000    . cyanocobalamin 2000 MCG tablet Take 2,000 mcg by mouth daily.      . cyclobenzaprine (FLEXERIL) 5 MG tablet Take 1 tablet (5 mg total) by mouth at bedtime. 60 tablet 3  . FERREX 150 150 MG capsule TAKE ONE CAPSULE BY MOUTH EVERY DAY 30 capsule 5  . fexofenadine (ALLEGRA) 180 MG tablet Take 180 mg by mouth daily as needed.     . Lancets 30G MISC USE TWICE DAILY AS DIRECTED FOR GLUCOSE TESTING AND MONITORING 100 each 4  . Magnesium 250 MG TABS Take 1 tablet by mouth as needed.     . Multiple Vitamin (MULTIVITAMIN) tablet Take 1 tablet by mouth daily.      Marland Kitchen ONE TOUCH LANCETS MISC Test twice daily. 200 each 5  . ONETOUCH VERIO test strip TEST TWICE A DAY 200 each 4  . pioglitazone (ACTOS) 45 MG tablet  TAKE 1 TABLET BY MOUTH EVERY DAY 90 tablet 1  . triamcinolone cream (KENALOG) 0.1 % Apply 1 application topically 2 (two) times daily. 30 g 3   No current facility-administered medications on file prior to visit.     BP 110/60 (BP Location: Left Wrist, Patient Position: Sitting, Cuff Size: Normal)   Pulse 83   Temp 97.7 F (36.5 C) (Oral)   Wt 233 lb (105.7 kg)   SpO2 95%   BMI 42.62 kg/m      Review of Systems  Constitutional: Negative.   HENT: Negative for congestion, dental problem, hearing loss, rhinorrhea, sinus pressure, sore throat and tinnitus.   Eyes: Negative for pain, discharge and visual disturbance.  Respiratory: Negative for cough and shortness of breath.   Cardiovascular: Negative for chest pain, palpitations and leg swelling.  Gastrointestinal: Negative for abdominal distention, abdominal pain, blood in stool, constipation, diarrhea, nausea and vomiting.  Genitourinary: Negative for difficulty urinating, dysuria, flank pain, frequency, hematuria, pelvic pain, urgency, vaginal bleeding, vaginal discharge and vaginal pain.  Musculoskeletal: Positive for arthralgias, back pain and gait problem. Negative for joint swelling.  Skin: Negative for rash.  Neurological: Negative for dizziness, syncope, speech difficulty, weakness, numbness and headaches.  Hematological: Negative for adenopathy.  Psychiatric/Behavioral: Negative for agitation, behavioral problems and dysphoric mood. The patient is not nervous/anxious.        Objective:   Physical Exam  Constitutional: She is oriented to person, place, and time. She appears well-developed and well-nourished.  Weight 233 Blood pressure 110/70  HENT:  Head: Normocephalic.  Right Ear: External ear normal.  Left Ear: External ear normal.  Mouth/Throat: Oropharynx is clear and moist.  Eyes: Pupils are equal, round, and reactive to light. Conjunctivae and EOM are normal.  Neck: Normal range of motion. Neck supple. No  thyromegaly present.  Faint right carotid bruit  Cardiovascular: Normal rate, regular rhythm, normal heart sounds and intact distal pulses.  Pulmonary/Chest: Effort normal and breath sounds normal.  Abdominal: Soft. Bowel sounds are normal. She exhibits no mass. There is no tenderness.  Musculoskeletal: Normal range of motion.  Lymphadenopathy:    She has no cervical adenopathy.  Neurological: She is alert and oriented to person, place, and time.  Skin: Skin is warm and dry. No rash noted.  Psychiatric: She has a normal mood and affect.  Her behavior is normal.          Assessment & Plan:   Essential hypertension well-controlled Type 2 diabetes. Exogenous obesity.  Weight loss encouraged Chronic low back pain.  Follow-up orthopedics  Return in 4 months for medical follow-up. Medicines updated  Marletta Lor

## 2018-02-27 ENCOUNTER — Other Ambulatory Visit: Payer: Self-pay | Admitting: Internal Medicine

## 2018-03-04 DIAGNOSIS — J3089 Other allergic rhinitis: Secondary | ICD-10-CM | POA: Diagnosis not present

## 2018-03-04 DIAGNOSIS — J301 Allergic rhinitis due to pollen: Secondary | ICD-10-CM | POA: Diagnosis not present

## 2018-03-13 DIAGNOSIS — R69 Illness, unspecified: Secondary | ICD-10-CM | POA: Diagnosis not present

## 2018-03-25 DIAGNOSIS — J301 Allergic rhinitis due to pollen: Secondary | ICD-10-CM | POA: Diagnosis not present

## 2018-03-25 DIAGNOSIS — J3089 Other allergic rhinitis: Secondary | ICD-10-CM | POA: Diagnosis not present

## 2018-04-08 DIAGNOSIS — J3089 Other allergic rhinitis: Secondary | ICD-10-CM | POA: Diagnosis not present

## 2018-04-08 DIAGNOSIS — J301 Allergic rhinitis due to pollen: Secondary | ICD-10-CM | POA: Diagnosis not present

## 2018-04-13 ENCOUNTER — Ambulatory Visit: Payer: Medicare HMO

## 2018-04-15 ENCOUNTER — Ambulatory Visit (INDEPENDENT_AMBULATORY_CARE_PROVIDER_SITE_OTHER): Payer: Medicare HMO | Admitting: *Deleted

## 2018-04-15 DIAGNOSIS — Z23 Encounter for immunization: Secondary | ICD-10-CM | POA: Diagnosis not present

## 2018-04-19 ENCOUNTER — Encounter: Payer: Self-pay | Admitting: Family Medicine

## 2018-04-19 ENCOUNTER — Ambulatory Visit (INDEPENDENT_AMBULATORY_CARE_PROVIDER_SITE_OTHER): Payer: Medicare HMO | Admitting: Family Medicine

## 2018-04-19 VITALS — BP 124/78 | HR 87 | Temp 98.0°F | Resp 16 | Wt 232.6 lb

## 2018-04-19 DIAGNOSIS — R600 Localized edema: Secondary | ICD-10-CM

## 2018-04-19 DIAGNOSIS — G8929 Other chronic pain: Secondary | ICD-10-CM | POA: Diagnosis not present

## 2018-04-19 DIAGNOSIS — M159 Polyosteoarthritis, unspecified: Secondary | ICD-10-CM

## 2018-04-19 DIAGNOSIS — M5441 Lumbago with sciatica, right side: Secondary | ICD-10-CM

## 2018-04-19 DIAGNOSIS — E1142 Type 2 diabetes mellitus with diabetic polyneuropathy: Secondary | ICD-10-CM

## 2018-04-19 DIAGNOSIS — M150 Primary generalized (osteo)arthritis: Secondary | ICD-10-CM

## 2018-04-19 MED ORDER — GABAPENTIN 100 MG PO CAPS: 100.0000 mg | ORAL_CAPSULE | Freq: Every day | ORAL | 1 refills | Status: DC

## 2018-04-19 NOTE — Assessment & Plan Note (Signed)
We discussed benefits of wt loss as well as adverse effects of obesity. Consistency with healthy diet and physical activity recommended. Because joint pain, recommend low impact regular exercise.

## 2018-04-19 NOTE — Assessment & Plan Note (Addendum)
Diagnostic and prognosis discussed. Weight loss may help. No changes in current management, she will keep appointment with Dr. Jerilee Hoh.

## 2018-04-19 NOTE — Progress Notes (Signed)
ACUTE VISIT   HPI:  Chief Complaint  Patient presents with  . Leg Swelling  . Joint Pain    [name] is a 77 y.o. female, who is here today requesting handicap sticker to be completed.  History of OA and spinal stenosis, she follows with Dr. Lorin Mercy. Right-sided lower back pain radiated to right lower extremity with numbness. According to patient, surgical treatment was recommended but she is not interested in doing so for now. She is on chronic opioid treatment.  Pain is exacerbated by prolonged walking. She does not have walker or cane.  She is also complaining of lower extremity edema, she takes furosemide daily as needed. It seems to be worse at the end of the day after prolonged sitting, standing, or walking. Improved with lower extremity elevation, it is much better first thing in the morning. She has not noted erythema. She has hyperpigmentation changes on distal lower extremities, bilateral.  She has compression stockings that she wears sometimes.   Burning feet sensation for a while now. It is worse at night,interferring with sleep. No ulcers or local erythema.  History of DM 2, BS between 100 and 200. She is on metformin, glipizide, and Actos 45 mg daily.  BS's unusually low 100's, at night sometimes 200.   Lab Results  Component Value Date   HGBA1C 6.5 (A) 12/14/2017   She is trying to eat healthier, she cannot exercise due to joint pain. She has lost "a lot of weight" through a healthy diet, she is trying to follow weight watchers.  She has appt with new PCP,Dr Hernandez,05/27/18.   Review of Systems  Constitutional: Negative for activity change, appetite change, fatigue and fever.  HENT: Negative for mouth sores, nosebleeds and trouble swallowing.   Eyes: Negative for redness and visual disturbance.  Respiratory: Negative for cough, shortness of breath and wheezing.   Cardiovascular: Positive for leg swelling. Negative for chest pain  and palpitations.  Gastrointestinal: Negative for abdominal pain, nausea and vomiting.       Negative for changes in bowel habits.  Genitourinary: Negative for decreased urine volume, dysuria and hematuria.  Musculoskeletal: Positive for arthralgias, back pain and gait problem.  Neurological: Positive for numbness. Negative for syncope, weakness and headaches.  Psychiatric/Behavioral: Positive for sleep disturbance. Negative for confusion.      Current Outpatient Medications on File Prior to Visit  Medication Sig Dispense Refill  . amLODipine-benazepril (LOTREL) 5-40 MG capsule TAKE 1 CAPSULE BY MOUTH EVERY DAY 90 capsule 4  . Ascorbic Acid (VITAMIN C) 1000 MG tablet Take 1,000 mg by mouth 2 (two) times daily.    . Azelastine-Fluticasone 137-50 MCG/ACT SUSP Place 1 spray into both nostrils daily. 1 Bottle 2  . Cholecalciferol (VITAMIN D) 2000 UNITS CAPS Take 1 capsule by mouth daily. Taking 2 caps daily 4,000    . cyanocobalamin 2000 MCG tablet Take 2,000 mcg by mouth daily.      . cyclobenzaprine (FLEXERIL) 5 MG tablet Take 1 tablet (5 mg total) by mouth at bedtime. 60 tablet 3  . FERREX 150 150 MG capsule TAKE ONE CAPSULE BY MOUTH EVERY DAY 30 capsule 5  . fexofenadine (ALLEGRA) 180 MG tablet Take 180 mg by mouth daily as needed.     . furosemide (LASIX) 20 MG tablet Take 1 tablet (20 mg total) by mouth daily as needed. 90 tablet 4  . glipiZIDE (GLUCOTROL XL) 5 MG 24 hr tablet Take 1 tablet (5 mg total) by  mouth daily. 90 tablet 3  . hydrochlorothiazide (HYDRODIURIL) 25 MG tablet Take 1 tablet (25 mg total) by mouth daily. 90 tablet 3  . HYDROcodone-acetaminophen (NORCO/VICODIN) 5-325 MG tablet Take 1 tablet by mouth every 6 (six) hours as needed. 60 tablet 0  . Lancets 30G MISC USE TWICE DAILY AS DIRECTED FOR GLUCOSE TESTING AND MONITORING 100 each 4  . Magnesium 250 MG TABS Take 1 tablet by mouth as needed.     . metFORMIN (GLUCOPHAGE) 1000 MG tablet Take 1 tablet (1,000 mg total) by  mouth 2 (two) times daily with a meal. 180 tablet 5  . metoprolol succinate (TOPROL-XL) 50 MG 24 hr tablet TAKE 1 TABLET BY MOUTH EVERY MORNING. TAKE WITH OR IMMEDIATELY FOLLOWING A MEAL. 90 tablet 3  . Multiple Vitamin (MULTIVITAMIN) tablet Take 1 tablet by mouth daily.      Marland Kitchen ONE TOUCH LANCETS MISC Test twice daily. 200 each 5  . ONETOUCH VERIO test strip TEST TWICE A DAY 200 each 4  . pioglitazone (ACTOS) 45 MG tablet TAKE 1 TABLET BY MOUTH EVERY DAY 90 tablet 1  . pravastatin (PRAVACHOL) 40 MG tablet Take 1 tablet (40 mg total) by mouth daily. 90 tablet 4  . sitaGLIPtin (JANUVIA) 100 MG tablet Take 1 tablet (100 mg total) by mouth daily. 90 tablet 2  . triamcinolone cream (KENALOG) 0.1 % Apply 1 application topically 2 (two) times daily. 30 g 3   No current facility-administered medications on file prior to visit.      Past Medical History:  Diagnosis Date  . ALLERGIC RHINITIS 06/17/2010  . ANEMIA-NOS 06/17/2010  . BREAST CANCER, HX OF 06/17/2010  . Cancer [facility]) 2008   right breast  . Cataract   . COPD 06/17/2010  . DIABETES MELLITUS, TYPE II 06/17/2010  . Endometriosis   . Fibroid    FIBROID  . HYPERLIPIDEMIA 06/17/2010  . HYPERTENSION 06/17/2010  . Leg swelling   . LOW BACK PAIN 06/17/2010  . OSTEOARTHRITIS 06/17/2010  . OSTEOPENIA 06/17/2010  . Weakness    No Known Allergies  Social History   Socioeconomic History  . Marital status: Married    Spouse name: Not on file  . Number of children: Not on file  . Years of education: Not on file  . Highest education level: Not on file  Occupational History  . Not on file  Social Needs  . Financial resource strain: Not on file  . Food insecurity:    Worry: Not on file    Inability: Not on file  . Transportation needs:    Medical: Not on file    Non-medical: Not on file  Tobacco Use  . Smoking status: Never Smoker  . Smokeless tobacco: Never Used  Substance and Sexual Activity  . Alcohol use: No  . Drug use: No   . Sexual activity: Never    Birth control/protection: Post-menopausal, Surgical  Lifestyle  . Physical activity:    Days per week: Not on file    Minutes per session: Not on file  . Stress: Not on file  Relationships  . Social connections:    Talks on phone: Not on file    Gets together: Not on file    Attends religious service: Not on file    Active member of club or organization: Not on file    Attends meetings of clubs or organizations: Not on file    Relationship status: Not on file  Other Topics Concern  . Not on file  Social History Narrative  . Not on file    Vitals:   04/19/18 1415  BP: 124/78  Pulse: 87  Resp: 16  Temp: 98 F (36.7 C)  SpO2: 97%   Body mass index is 42.54 kg/m.   Physical Exam  Nursing note and vitals reviewed. Constitutional: She is oriented to person, place, and time. She appears well-developed. No distress.  HENT:  Head: Normocephalic and atraumatic.  Mouth/Throat: Oropharynx is clear and moist and mucous membranes are normal.  Eyes: Conjunctivae are normal.  Cardiovascular: Normal rate and regular rhythm.  No murmur heard. DP pulses present bilateral.  Respiratory: Effort normal and breath sounds normal. No respiratory distress.  GI: Soft. She exhibits no mass. There is no hepatomegaly. There is no tenderness.  Musculoskeletal: She exhibits no edema.       Lumbar back: She exhibits tenderness. She exhibits no bony tenderness.       Back:  Right knee crepitus. Knee limitation of range of motion, flexion, bilateral.   Lymphadenopathy:    She has no cervical adenopathy.  Neurological: She is alert and oriented to person, place, and time. She has normal strength. No cranial nerve deficit.  Otherwise stable gait with no assistance.  Skin: Skin is warm. No rash noted. No erythema.  Psychiatric: She has a normal mood and affect.  Well groomed, good eye contact.   Diabetic Foot Exam - Simple   Simple Foot Form Diabetic Foot exam  was performed with the following findings:  Yes 04/19/2018  4:25 PM  Visual Inspection No deformities, no ulcerations, no other skin breakdown bilaterally:  Yes Sensation Testing See comments:  Yes Pulse Check Posterior Tibialis and Dorsalis pulse intact bilaterally:  Yes Comments Decreased monofilament bilateral.     ASSESSMENT AND PLAN:   Ms. Adilenne was seen today for leg swelling and joint pain.  Diagnoses and all orders for this visit:   Obesity, morbid, BMI 40.0-49.9 ([place]) We discussed benefits of wt loss as well as adverse effects of obesity. Consistency with healthy diet and physical activity recommended. Because joint pain, recommend low impact regular exercise.   Osteoarthritis Diagnostic and prognosis discussed. Weight loss may help. No changes in current management, she will keep appointment with Dr. Jerilee Hoh.   Diabetic peripheral neuropathy associated with type 2 diabetes mellitus ([place]) After discussion of dome side effects she agrees with trying Gabapentin. I will recommended low dose Gabapentin,100 mg at bedtime.It can be titrate up as tolerated by PCP. Foot care discussed. Fall precautions. F/U with PCP,keeps next appt.  -     gabapentin (NEURONTIN) 100 MG capsule; Take 1 capsule (100 mg total) by mouth at bedtime.  Chronic right-sided low back pain with right-sided sciatica  Gabapentin may also help with radicular pain. Side effects of Hydrocodone discussed. Handicap sticker completed.  -     gabapentin (NEURONTIN) 100 MG capsule; Take 1 capsule (100 mg total) by mouth at bedtime.  Bilateral lower extremity edema Possible etiologies discussed. Some of her meds can aggravate problem. No changes in HCTZ or Furosemide. LE elevation above heart level and compression stocking recommended.    No follow-ups on file.       Betty G. Martinique, MD  [facility]. [place] office.

## 2018-04-19 NOTE — Patient Instructions (Addendum)
A few things to remember from today's visit:   Diabetic peripheral neuropathy associated with type 2 diabetes mellitus ([place]) - Plan: gabapentin (NEURONTIN) 100 MG capsule  Primary osteoarthritis involving multiple joints  Chronic right-sided low back pain with right-sided sciatica - Plan: gabapentin (NEURONTIN) 100 MG capsule  Bilateral lower extremity edema Start gabapentin 100 mg at bedtime. Keep your appointment with Dr. Jerilee Hoh, gabapentin could be titrated up as tolerated.  Lower extremity elevation and compression stockings may help with swelling, Actos can aggravate problem.   Please be sure medication list is accurate. If a new problem present, please set up appointment sooner than planned today.

## 2018-04-28 ENCOUNTER — Other Ambulatory Visit: Payer: Self-pay | Admitting: Internal Medicine

## 2018-05-05 DIAGNOSIS — J3089 Other allergic rhinitis: Secondary | ICD-10-CM | POA: Diagnosis not present

## 2018-05-05 DIAGNOSIS — J301 Allergic rhinitis due to pollen: Secondary | ICD-10-CM | POA: Diagnosis not present

## 2018-05-25 ENCOUNTER — Encounter: Payer: Self-pay | Admitting: Internal Medicine

## 2018-05-25 ENCOUNTER — Ambulatory Visit (INDEPENDENT_AMBULATORY_CARE_PROVIDER_SITE_OTHER): Payer: Medicare HMO | Admitting: Internal Medicine

## 2018-05-25 ENCOUNTER — Encounter: Payer: Self-pay | Admitting: *Deleted

## 2018-05-25 VITALS — BP 120/80 | HR 88 | Temp 97.8°F | Wt 234.1 lb

## 2018-05-25 DIAGNOSIS — I10 Essential (primary) hypertension: Secondary | ICD-10-CM | POA: Diagnosis not present

## 2018-05-25 DIAGNOSIS — Z853 Personal history of malignant neoplasm of breast: Secondary | ICD-10-CM

## 2018-05-25 DIAGNOSIS — E785 Hyperlipidemia, unspecified: Secondary | ICD-10-CM

## 2018-05-25 DIAGNOSIS — E119 Type 2 diabetes mellitus without complications: Secondary | ICD-10-CM | POA: Diagnosis not present

## 2018-05-25 DIAGNOSIS — M159 Polyosteoarthritis, unspecified: Secondary | ICD-10-CM

## 2018-05-25 DIAGNOSIS — J301 Allergic rhinitis due to pollen: Secondary | ICD-10-CM | POA: Diagnosis not present

## 2018-05-25 DIAGNOSIS — G8929 Other chronic pain: Secondary | ICD-10-CM

## 2018-05-25 DIAGNOSIS — J3089 Other allergic rhinitis: Secondary | ICD-10-CM | POA: Diagnosis not present

## 2018-05-25 DIAGNOSIS — M5441 Lumbago with sciatica, right side: Secondary | ICD-10-CM

## 2018-05-25 DIAGNOSIS — M150 Primary generalized (osteo)arthritis: Secondary | ICD-10-CM | POA: Diagnosis not present

## 2018-05-25 LAB — HEMOGLOBIN A1C: HEMOGLOBIN A1C: 6.5 % (ref 4.6–6.5)

## 2018-05-25 MED ORDER — HYDROCODONE-ACETAMINOPHEN 5-325 MG PO TABS: 1.0000 | ORAL_TABLET | Freq: Two times a day (BID) | ORAL | 0 refills | Status: DC | PRN

## 2018-05-25 NOTE — Patient Instructions (Signed)
-  Was nice meeting you!  -You will have hemoglobin A1c drawn today.  -Please follow-up with in 3 to 4 months per opioid protocol for diabetic follow-up.

## 2018-05-25 NOTE — Progress Notes (Signed)
Established Patient Office Visit     CC/Reason for Visit: To establish care, medication refills and follow-up of chronic medical conditions  HPI: Doris Jackson is a 77 y.o. female who is coming in today for the above mentioned reasons.  Annual medical wellness visit is due in January 2020.  Past Medical History is significant for: Morbid obesity, remote history of breast cancer, type 2 diabetes, hyperlipidemia.  She has no acute complaints today, she has done well since last seen in clinic.  She states that sometimes when she takes her Januvia her CBG is low so she only takes it as needed.  On her last visit she was prescribed Neurontin for presumed diabetic neuropathy which she has decided not to take, I have advised that as long as neuropathy is not a huge issue I am okay with discontinuation.  She is due for hemoglobin A1c today.  She is also due for pneumonia shot which she is refusing.   Past Medical/Surgical History: Past Medical History:  Diagnosis Date  . ALLERGIC RHINITIS 06/17/2010  . ANEMIA-NOS 06/17/2010  . BREAST CANCER, HX OF 06/17/2010  . Cancer [facility]) 2008   right breast  . Cataract   . COPD 06/17/2010  . DIABETES MELLITUS, TYPE II 06/17/2010  . Endometriosis   . Fibroid    FIBROID  . HYPERLIPIDEMIA 06/17/2010  . HYPERTENSION 06/17/2010  . Leg swelling   . LOW BACK PAIN 06/17/2010  . OSTEOARTHRITIS 06/17/2010  . OSTEOPENIA 06/17/2010  . Weakness     Past Surgical History:  Procedure Laterality Date  . ABDOMINAL HYSTERECTOMY  1988   TAH,LSO  . BREAST LUMPECTOMY  2007   LUMPECTOMY FOLLOWED BY RADIATION  . OOPHORECTOMY  1988   TAH,LSO  . TUBAL LIGATION      Social History:  reports that she has never smoked. She has never used smokeless tobacco. She reports that she does not drink alcohol or use drugs.  Allergies: No Known Allergies  Family History:  Family History  Problem Relation Age of Onset  . Diabetes Mother   . Hypertension Sister   .  Asthma Father   . Diabetes Brother      Current Outpatient Medications:  .  amLODipine-benazepril (LOTREL) 5-40 MG capsule, TAKE 1 CAPSULE BY MOUTH EVERY DAY, Disp: 90 capsule, Rfl: 4 .  Ascorbic Acid (VITAMIN C) 1000 MG tablet, Take 1,000 mg by mouth 2 (two) times daily., Disp: , Rfl:  .  Azelastine-Fluticasone 137-50 MCG/ACT SUSP, Place 1 spray into both nostrils daily., Disp: 1 Bottle, Rfl: 2 .  Cholecalciferol (VITAMIN D) 2000 UNITS CAPS, Take 1 capsule by mouth daily. Taking 2 caps daily 4,000, Disp: , Rfl:  .  cyanocobalamin 2000 MCG tablet, Take 2,000 mcg by mouth daily.  , Disp: , Rfl:  .  cyclobenzaprine (FLEXERIL) 5 MG tablet, Take 1 tablet (5 mg total) by mouth at bedtime., Disp: 60 tablet, Rfl: 3 .  FERREX 150 150 MG capsule, TAKE ONE CAPSULE BY MOUTH EVERY DAY, Disp: 90 capsule, Rfl: 1 .  fexofenadine (ALLEGRA) 180 MG tablet, Take 180 mg by mouth daily as needed. , Disp: , Rfl:  .  furosemide (LASIX) 20 MG tablet, Take 1 tablet (20 mg total) by mouth daily as needed., Disp: 90 tablet, Rfl: 4 .  glipiZIDE (GLUCOTROL XL) 5 MG 24 hr tablet, Take 1 tablet (5 mg total) by mouth daily., Disp: 90 tablet, Rfl: 3 .  hydrochlorothiazide (HYDRODIURIL) 25 MG tablet, Take 1 tablet (25  mg total) by mouth daily., Disp: 90 tablet, Rfl: 3 .  HYDROcodone-acetaminophen (NORCO/VICODIN) 5-325 MG tablet, Take 1 tablet by mouth every 6 (six) hours as needed., Disp: 60 tablet, Rfl: 0 .  Lancets 30G MISC, USE TWICE DAILY AS DIRECTED FOR GLUCOSE TESTING AND MONITORING, Disp: 100 each, Rfl: 4 .  Magnesium 250 MG TABS, Take 1 tablet by mouth as needed. , Disp: , Rfl:  .  metFORMIN (GLUCOPHAGE) 1000 MG tablet, Take 1 tablet (1,000 mg total) by mouth 2 (two) times daily with a meal., Disp: 180 tablet, Rfl: 5 .  metoprolol succinate (TOPROL-XL) 50 MG 24 hr tablet, TAKE 1 TABLET BY MOUTH EVERY MORNING. TAKE WITH OR IMMEDIATELY FOLLOWING A MEAL., Disp: 90 tablet, Rfl: 3 .  Multiple Vitamin (MULTIVITAMIN) tablet,  Take 1 tablet by mouth daily.  , Disp: , Rfl:  .  ONE TOUCH LANCETS MISC, Test twice daily., Disp: 200 each, Rfl: 5 .  ONETOUCH VERIO test strip, TEST TWICE A DAY, Disp: 200 each, Rfl: 4 .  pioglitazone (ACTOS) 45 MG tablet, TAKE 1 TABLET BY MOUTH EVERY DAY, Disp: 90 tablet, Rfl: 1 .  pravastatin (PRAVACHOL) 40 MG tablet, Take 1 tablet (40 mg total) by mouth daily., Disp: 90 tablet, Rfl: 4 .  sitaGLIPtin (JANUVIA) 100 MG tablet, Take 1 tablet (100 mg total) by mouth daily., Disp: 90 tablet, Rfl: 2 .  triamcinolone cream (KENALOG) 0.1 %, Apply 1 application topically 2 (two) times daily., Disp: 30 g, Rfl: 3  Review of Systems:  Constitutional: Denies fever, chills, diaphoresis, appetite change and fatigue.  HEENT: Denies photophobia, eye pain, redness, hearing loss, ear pain, congestion, sore throat, rhinorrhea, sneezing, mouth sores, trouble swallowing, neck pain, neck stiffness and tinnitus.   Respiratory: Denies SOB, DOE, cough, chest tightness,  and wheezing.   Cardiovascular: Denies chest pain, palpitations and leg swelling.  Gastrointestinal: Denies nausea, vomiting, abdominal pain, diarrhea, constipation, blood in stool and abdominal distention.  Genitourinary: Denies dysuria, urgency, frequency, hematuria, flank pain and difficulty urinating.  Endocrine: Denies: hot or cold intolerance, sweats, changes in hair or nails, polyuria, polydipsia. Musculoskeletal: Denies myalgias, back pain, joint swelling, arthralgias and gait problem.  Skin: Denies pallor, rash and wound.  Neurological: Denies dizziness, seizures, syncope, weakness, light-headedness, numbness and headaches.  Hematological: Denies adenopathy. Easy bruising, personal or family bleeding history  Psychiatric/Behavioral: Denies suicidal ideation, mood changes, confusion, nervousness, sleep disturbance and agitation    Physical Exam: Vitals:   05/25/18 1437  BP: 120/80  Pulse: 88  Temp: 97.8 F (36.6 C)  TempSrc: Oral    SpO2: 98%  Weight: 234 lb 1.6 oz (106.2 kg)    Body mass index is 42.82 kg/m.   Constitutional: NAD, calm, comfortable, obese Eyes: PERRL, lids and conjunctivae normal ENMT: Mucous membranes are moist. Posterior pharynx clear of any exudate or lesions. Normal dentition.  Neck: normal, supple, no masses, no thyromegaly Respiratory: clear to auscultation bilaterally, no wheezing, no crackles. Normal respiratory effort. No accessory muscle use.  Cardiovascular: Regular rate and rhythm, no murmurs / rubs / gallops. No extremity edema. 2+ pedal pulses. No carotid bruits.  Abdomen: no tenderness, no masses palpated. No hepatosplenomegaly. Bowel sounds positive.  Musculoskeletal: no clubbing / cyanosis. No joint deformity upper and lower extremities. Good ROM, no contractures. Normal muscle tone.  Skin: no rashes, lesions, ulcers. No induration, skin color changes due to venous insufficiency. Neurologic: Grossly intact and nonfocal Psychiatric: Normal judgment and insight. Alert and oriented x 3. Normal mood.  Impression and Plan:  Diabetes mellitus without complication ([place]) - Plan: Hemoglobin A1c Lab Results  Component Value Date   HGBA1C 6.5 (A) 12/14/2017   -She is due for repeat A1c today. -Will make no changes to medications at present.  Essential hypertension -Well-controlled on current medication.  Dyslipidemia -On pravastatin. -LDL was 91 in January 2019.  Primary osteoarthritis involving multiple joints/chronic right-sided low back pain with sciatica -She is chronically on hydrocodone.  Is requesting refill today. -She states that she saw orthopedics in June and "I have some kind of narrowing in my back and they recommended surgery but I do want to have that".  Terry printed, initialed  and scanned into the EMR.   Indication for chronic opioid:  Chronic low back pain with sciatica, osteoarthritis of bilateral knees Medication and dose:   Hydrocodone/acetaminophen 5/325 mg every 6 hours as needed # pills per month:  60 Last UDS date:  October/2018 Opioid Treatment Agreement signed:  Yes Opioid Treatment Agreement last reviewed with patient:  05/25/2018 [facility] reviewed this encounter (include red flags):   Yes, no red flags, overdose risk score is 60   Obesity, morbid, BMI 40.0-49.9 ([place]) -Noted, discussed lifestyle modifications.   BREAST CANCER, HX OF -Remote    Patient Instructions  -Was nice meeting you!  -You will have hemoglobin A1c drawn today.  -Please follow-up with in 3 to 4 months per opioid protocol for diabetic follow-up.     Lelon Frohlich, MD [facility]

## 2018-06-10 ENCOUNTER — Other Ambulatory Visit: Payer: Self-pay | Admitting: *Deleted

## 2018-06-10 DIAGNOSIS — R69 Illness, unspecified: Secondary | ICD-10-CM | POA: Diagnosis not present

## 2018-06-10 MED ORDER — PIOGLITAZONE HCL 45 MG PO TABS: 45.0000 mg | ORAL_TABLET | Freq: Every day | ORAL | 1 refills | Status: DC

## 2018-06-16 DIAGNOSIS — J301 Allergic rhinitis due to pollen: Secondary | ICD-10-CM | POA: Diagnosis not present

## 2018-06-16 DIAGNOSIS — J3089 Other allergic rhinitis: Secondary | ICD-10-CM | POA: Diagnosis not present

## 2018-06-30 HISTORY — PX: CORNEAL TRANSPLANT: SHX108

## 2018-07-07 DIAGNOSIS — J301 Allergic rhinitis due to pollen: Secondary | ICD-10-CM | POA: Diagnosis not present

## 2018-07-07 DIAGNOSIS — J3089 Other allergic rhinitis: Secondary | ICD-10-CM | POA: Diagnosis not present

## 2018-07-27 DIAGNOSIS — H2513 Age-related nuclear cataract, bilateral: Secondary | ICD-10-CM | POA: Diagnosis not present

## 2018-07-27 DIAGNOSIS — H1851 Endothelial corneal dystrophy: Secondary | ICD-10-CM | POA: Diagnosis not present

## 2018-07-27 DIAGNOSIS — H20011 Primary iridocyclitis, right eye: Secondary | ICD-10-CM | POA: Diagnosis not present

## 2018-07-27 DIAGNOSIS — H18891 Other specified disorders of cornea, right eye: Secondary | ICD-10-CM | POA: Diagnosis not present

## 2018-07-27 DIAGNOSIS — H1820 Unspecified corneal edema: Secondary | ICD-10-CM | POA: Diagnosis not present

## 2018-07-29 DIAGNOSIS — J301 Allergic rhinitis due to pollen: Secondary | ICD-10-CM | POA: Diagnosis not present

## 2018-07-29 DIAGNOSIS — J3089 Other allergic rhinitis: Secondary | ICD-10-CM | POA: Diagnosis not present

## 2018-07-29 DIAGNOSIS — H1851 Endothelial corneal dystrophy: Secondary | ICD-10-CM | POA: Diagnosis not present

## 2018-08-05 ENCOUNTER — Ambulatory Visit: Payer: Medicare HMO | Admitting: Internal Medicine

## 2018-08-06 ENCOUNTER — Encounter: Payer: Self-pay | Admitting: Internal Medicine

## 2018-08-06 ENCOUNTER — Ambulatory Visit (INDEPENDENT_AMBULATORY_CARE_PROVIDER_SITE_OTHER): Payer: Medicare HMO | Admitting: Internal Medicine

## 2018-08-06 VITALS — BP 120/80 | HR 92 | Temp 97.4°F | Wt 231.9 lb

## 2018-08-06 DIAGNOSIS — E119 Type 2 diabetes mellitus without complications: Secondary | ICD-10-CM

## 2018-08-06 DIAGNOSIS — M5441 Lumbago with sciatica, right side: Secondary | ICD-10-CM | POA: Diagnosis not present

## 2018-08-06 DIAGNOSIS — G8929 Other chronic pain: Secondary | ICD-10-CM | POA: Diagnosis not present

## 2018-08-06 DIAGNOSIS — M150 Primary generalized (osteo)arthritis: Secondary | ICD-10-CM | POA: Diagnosis not present

## 2018-08-06 DIAGNOSIS — M159 Polyosteoarthritis, unspecified: Secondary | ICD-10-CM

## 2018-08-06 LAB — POCT GLYCOSYLATED HEMOGLOBIN (HGB A1C): HEMOGLOBIN A1C: 6.6 % — AB (ref 4.0–5.6)

## 2018-08-06 MED ORDER — HYDROCODONE-ACETAMINOPHEN 5-325 MG PO TABS: 1.0000 | ORAL_TABLET | Freq: Two times a day (BID) | ORAL | 0 refills | Status: DC | PRN

## 2018-08-06 NOTE — Patient Instructions (Addendum)
Great seeing you today!  Instructions: -attend your mammogram appt at the end of the month -we will refill your hydrocodone medication -see you back in 3 months.   DASH Eating Plan DASH stands for "Dietary Approaches to Stop Hypertension." The DASH eating plan is a healthy eating plan that has been shown to reduce high blood pressure (hypertension). It may also reduce your risk for type 2 diabetes, heart disease, and stroke. The DASH eating plan may also help with weight loss. What are tips for following this plan?  General guidelines  Avoid eating more than 2,300 mg (milligrams) of salt (sodium) a day. If you have hypertension, you may need to reduce your sodium intake to 1,500 mg a day.  Limit alcohol intake to no more than 1 drink a day for nonpregnant women and 2 drinks a day for men. One drink equals 12 oz of beer, 5 oz of wine, or 1 oz of hard liquor.  Work with your health care provider to maintain a healthy body weight or to lose weight. Ask what an ideal weight is for you.  Get at least 30 minutes of exercise that causes your heart to beat faster (aerobic exercise) most days of the week. Activities may include walking, swimming, or biking.  Work with your health care provider or diet and nutrition specialist (dietitian) to adjust your eating plan to your individual calorie needs. Reading food labels   Check food labels for the amount of sodium per serving. Choose foods with less than 5 percent of the Daily Value of sodium. Generally, foods with less than 300 mg of sodium per serving fit into this eating plan.  To find whole grains, look for the word "whole" as the first word in the ingredient list. Shopping  Buy products labeled as "low-sodium" or "no salt added."  Buy fresh foods. Avoid canned foods and premade or frozen meals. Cooking  Avoid adding salt when cooking. Use salt-free seasonings or herbs instead of table salt or sea salt. Check with your health care  provider or pharmacist before using salt substitutes.  Do not fry foods. Cook foods using healthy methods such as baking, boiling, grilling, and broiling instead.  Cook with heart-healthy oils, such as olive, canola, soybean, or sunflower oil. Meal planning  Eat a balanced diet that includes: ? 5 or more servings of fruits and vegetables each day. At each meal, try to fill half of your plate with fruits and vegetables. ? Up to 6-8 servings of whole grains each day. ? Less than 6 oz of lean meat, poultry, or fish each day. A 3-oz serving of meat is about the same size as a deck of cards. One egg equals 1 oz. ? 2 servings of low-fat dairy each day. ? A serving of nuts, seeds, or beans 5 times each week. ? Heart-healthy fats. Healthy fats called Omega-3 fatty acids are found in foods such as flaxseeds and coldwater fish, like sardines, salmon, and mackerel.  Limit how much you eat of the following: ? Canned or prepackaged foods. ? Food that is high in trans fat, such as fried foods. ? Food that is high in saturated fat, such as fatty meat. ? Sweets, desserts, sugary drinks, and other foods with added sugar. ? Full-fat dairy products.  Do not salt foods before eating.  Try to eat at least 2 vegetarian meals each week.  Eat more home-cooked food and less restaurant, buffet, and fast food.  When eating at a restaurant, ask that  your food be prepared with less salt or no salt, if possible. What foods are recommended? The items listed may not be a complete list. Talk with your dietitian about what dietary choices are best for you. Grains Whole-grain or whole-wheat bread. Whole-grain or whole-wheat pasta. Brown rice. [place]. Bulgur. Whole-grain and low-sodium cereals. Pita bread. Low-fat, low-sodium crackers. Whole-wheat flour tortillas. Vegetables Fresh or frozen vegetables (raw, steamed, roasted, or grilled). Low-sodium or reduced-sodium tomato and vegetable juice. Low-sodium or  reduced-sodium tomato sauce and tomato paste. Low-sodium or reduced-sodium canned vegetables. Fruits All fresh, dried, or frozen fruit. Canned fruit in natural juice (without added sugar). Meat and other protein foods Skinless chicken or [place]. Ground chicken or [place]. Pork with fat trimmed off. Fish and seafood. Egg whites. Dried beans, peas, or lentils. Unsalted nuts, nut butters, and seeds. Unsalted canned beans. Lean cuts of beef with fat trimmed off. Low-sodium, lean deli meat. Dairy Low-fat (1%) or fat-free (skim) milk. Fat-free, low-fat, or reduced-fat cheeses. Nonfat, low-sodium ricotta or cottage cheese. Low-fat or nonfat yogurt. Low-fat, low-sodium cheese. Fats and oils Soft margarine without trans fats. Vegetable oil. Low-fat, reduced-fat, or light mayonnaise and salad dressings (reduced-sodium). Canola, safflower, olive, soybean, and sunflower oils. Avocado. Seasoning and other foods Herbs. Spices. Seasoning mixes without salt. Unsalted popcorn and pretzels. Fat-free sweets. What foods are not recommended? The items listed may not be a complete list. Talk with your dietitian about what dietary choices are best for you. Grains Baked goods made with fat, such as croissants, muffins, or some breads. Dry pasta or rice meal packs. Vegetables Creamed or fried vegetables. Vegetables in a cheese sauce. Regular canned vegetables (not low-sodium or reduced-sodium). Regular canned tomato sauce and paste (not low-sodium or reduced-sodium). Regular tomato and vegetable juice (not low-sodium or reduced-sodium). Angie Fava. Olives. Fruits Canned fruit in a light or heavy syrup. Fried fruit. Fruit in cream or butter sauce. Meat and other protein foods Fatty cuts of meat. Ribs. Fried meat. Berniece Salines. Sausage. Bologna and other processed lunch meats. Salami. Fatback. Hotdogs. Bratwurst. Salted nuts and seeds. Canned beans with added salt. Canned or smoked fish. Whole eggs or egg yolks. Chicken or [place]  with skin. Dairy Whole or 2% milk, cream, and half-and-half. Whole or full-fat cream cheese. Whole-fat or sweetened yogurt. Full-fat cheese. Nondairy creamers. Whipped toppings. Processed cheese and cheese spreads. Fats and oils Butter. Stick margarine. Lard. Shortening. Ghee. Bacon fat. Tropical oils, such as coconut, palm kernel, or palm oil. Seasoning and other foods Salted popcorn and pretzels. Onion salt, garlic salt, seasoned salt, table salt, and sea salt. Worcestershire sauce. Tartar sauce. Barbecue sauce. Teriyaki sauce. Soy sauce, including reduced-sodium. Steak sauce. Canned and packaged gravies. Fish sauce. Oyster sauce. Cocktail sauce. Horseradish that you find on the shelf. Ketchup. Mustard. Meat flavorings and tenderizers. Bouillon cubes. Hot sauce and Tabasco sauce. Premade or packaged marinades. Premade or packaged taco seasonings. Relishes. Regular salad dressings. Where to find more information:  National Heart, Lung, and [place]: https://wilson-eaton.com/  American Heart Association: www.heart.org Summary  The DASH eating plan is a healthy eating plan that has been shown to reduce high blood pressure (hypertension). It may also reduce your risk for type 2 diabetes, heart disease, and stroke.  With the DASH eating plan, you should limit salt (sodium) intake to 2,300 mg a day. If you have hypertension, you may need to reduce your sodium intake to 1,500 mg a day.  When on the DASH eating plan, aim to eat more fresh fruits and  vegetables, whole grains, lean proteins, low-fat dairy, and heart-healthy fats.  Work with your health care provider or diet and nutrition specialist (dietitian) to adjust your eating plan to your individual calorie needs. This information is not intended to replace advice given to you by your health care provider. Make sure you discuss any questions you have with your health care provider. Document Released: 06/05/2011 Document Revised: 06/09/2016  Document Reviewed: 06/09/2016 Elsevier Interactive Patient Education  2019 [employer].

## 2018-08-06 NOTE — Progress Notes (Signed)
Established Patient Office Visit     CC/Reason for Visit: Medication refills  HPI: Doris Jackson is a 78 y.o. female who is coming in today for medication refills per opioid contract agreement. No changes since last visit. 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 Past Medical History is significant for: Morbid obesity, remote history of breast cancer, type 2 diabetes, hyperlipidemia. Also due for A1c today. No complaints.   [place] printed, initialed  and scanned into the EMR.   Indication for chronic opioid: chronic LBP with sciatica, OA of bilateral knees Medication and dose: hydrocodone 5/325 q 6 hrs PRN # pills per month: #60 tabs a month Last UDS date: Oct 2018 Opioid Treatment Agreement signed: Yes Opioid Treatment Agreement last reviewed with patient: 05/25/18 [facility] reviewed this encounter (include red flags):  yes, no red flags, overdose risk score of 140    Past Medical/Surgical History: Past Medical History:  Diagnosis Date  . ALLERGIC RHINITIS 06/17/2010  . ANEMIA-NOS 06/17/2010  . BREAST CANCER, HX OF 06/17/2010  . Cancer [facility]) 2008   right breast  . Cataract   . COPD 06/17/2010  . DIABETES MELLITUS, TYPE II 06/17/2010  . Endometriosis   . Fibroid    FIBROID  . HYPERLIPIDEMIA 06/17/2010  . HYPERTENSION 06/17/2010  . Leg swelling   . LOW BACK PAIN 06/17/2010  . OSTEOARTHRITIS 06/17/2010  . OSTEOPENIA 06/17/2010  . Weakness     Past Surgical History:  Procedure Laterality Date  . ABDOMINAL HYSTERECTOMY  1988   TAH,LSO  . BREAST LUMPECTOMY  2007   LUMPECTOMY FOLLOWED BY RADIATION  . OOPHORECTOMY  1988   TAH,LSO  . TUBAL LIGATION      Social History:  reports that she has never smoked. She has never used smokeless tobacco. She reports that she does not drink alcohol or use drugs.  Allergies: No Known Allergies  Family History:  Family History  Problem Relation Age of Onset  . Diabetes Mother   . Hypertension  Sister   . Asthma Father   . Diabetes Brother      Current Outpatient Medications:  .  amLODipine-benazepril (LOTREL) 5-40 MG capsule, TAKE 1 CAPSULE BY MOUTH EVERY DAY, Disp: 90 capsule, Rfl: 4 .  Ascorbic Acid (VITAMIN C) 1000 MG tablet, Take 1,000 mg by mouth 2 (two) times daily., Disp: , Rfl:  .  Azelastine-Fluticasone 137-50 MCG/ACT SUSP, Place 1 spray into both nostrils daily., Disp: 1 Bottle, Rfl: 2 .  Cholecalciferol (VITAMIN D) 2000 UNITS CAPS, Take 1 capsule by mouth daily. Taking 2 caps daily 4,000, Disp: , Rfl:  .  cyanocobalamin 2000 MCG tablet, Take 2,000 mcg by mouth daily.  , Disp: , Rfl:  .  cyclobenzaprine (FLEXERIL) 5 MG tablet, Take 1 tablet (5 mg total) by mouth at bedtime., Disp: 60 tablet, Rfl: 3 .  FERREX 150 150 MG capsule, TAKE ONE CAPSULE BY MOUTH EVERY DAY, Disp: 90 capsule, Rfl: 1 .  fexofenadine (ALLEGRA) 180 MG tablet, Take 180 mg by mouth daily as needed. , Disp: , Rfl:  .  furosemide (LASIX) 20 MG tablet, Take 1 tablet (20 mg total) by mouth daily as needed., Disp: 90 tablet, Rfl: 4 .  glipiZIDE (GLUCOTROL XL) 5 MG 24 hr tablet, Take 1 tablet (5 mg total) by mouth daily., Disp: 90 tablet, Rfl: 3 .  hydrochlorothiazide (HYDRODIURIL) 25 MG tablet, Take 1 tablet (25 mg total) by mouth daily., Disp: 90 tablet, Rfl: 3 .  HYDROcodone-acetaminophen (  NORCO/VICODIN) 5-325 MG tablet, Take 1 tablet by mouth 2 (two) times daily as needed for moderate pain., Disp: 60 tablet, Rfl: 0 .  HYDROcodone-acetaminophen (NORCO/VICODIN) 5-325 MG tablet, Take 1 tablet by mouth 2 (two) times daily as needed., Disp: 60 tablet, Rfl: 0 .  HYDROcodone-acetaminophen (NORCO/VICODIN) 5-325 MG tablet, Take 1 tablet by mouth 2 (two) times daily as needed for moderate pain., Disp: 60 tablet, Rfl: 0 .  Lancets 30G MISC, USE TWICE DAILY AS DIRECTED FOR GLUCOSE TESTING AND MONITORING, Disp: 100 each, Rfl: 4 .  Magnesium 250 MG TABS, Take 1 tablet by mouth as needed. , Disp: , Rfl:  .  metFORMIN  (GLUCOPHAGE) 1000 MG tablet, Take 1 tablet (1,000 mg total) by mouth 2 (two) times daily with a meal., Disp: 180 tablet, Rfl: 5 .  metoprolol succinate (TOPROL-XL) 50 MG 24 hr tablet, TAKE 1 TABLET BY MOUTH EVERY MORNING. TAKE WITH OR IMMEDIATELY FOLLOWING A MEAL., Disp: 90 tablet, Rfl: 3 .  Multiple Vitamin (MULTIVITAMIN) tablet, Take 1 tablet by mouth daily.  , Disp: , Rfl:  .  ONE TOUCH LANCETS MISC, Test twice daily., Disp: 200 each, Rfl: 5 .  ONETOUCH VERIO test strip, TEST TWICE A DAY, Disp: 200 each, Rfl: 4 .  pioglitazone (ACTOS) 45 MG tablet, Take 1 tablet (45 mg total) by mouth daily., Disp: 90 tablet, Rfl: 1 .  pravastatin (PRAVACHOL) 40 MG tablet, Take 1 tablet (40 mg total) by mouth daily., Disp: 90 tablet, Rfl: 4 .  sitaGLIPtin (JANUVIA) 100 MG tablet, Take 1 tablet (100 mg total) by mouth daily., Disp: 90 tablet, Rfl: 2 .  triamcinolone cream (KENALOG) 0.1 %, Apply 1 application topically 2 (two) times daily., Disp: 30 g, Rfl: 3  Review of Systems:  Constitutional: Denies fever, chills, diaphoresis, appetite change and fatigue.  HEENT: Denies photophobia, eye pain, redness, hearing loss, ear pain, congestion, sore throat, rhinorrhea, sneezing, mouth sores, trouble swallowing, neck pain, neck stiffness and tinnitus.   Respiratory: Denies SOB, DOE, cough, chest tightness,  and wheezing.   Cardiovascular: Denies chest pain, palpitations and leg swelling.  Gastrointestinal: Denies nausea, vomiting, abdominal pain, diarrhea, constipation, blood in stool and abdominal distention.  Genitourinary: Denies dysuria, urgency, frequency, hematuria, flank pain and difficulty urinating.  Endocrine: Denies: hot or cold intolerance, sweats, changes in hair or nails, polyuria, polydipsia. Musculoskeletal: Denies myalgias, back pain, joint swelling, arthralgias and gait problem.  Skin: Denies pallor, rash and wound.  Neurological: Denies dizziness, seizures, syncope, weakness, light-headedness,  numbness and headaches.  Hematological: Denies adenopathy. Easy bruising, personal or family bleeding history  Psychiatric/Behavioral: Denies suicidal ideation, mood changes, confusion, nervousness, sleep disturbance and agitation    Physical Exam: Vitals:   08/06/18 1415  BP: 120/80  Pulse: 92  Temp: (!) 97.4 F (36.3 C)  TempSrc: Oral  SpO2: 96%  Weight: 231 lb 14.4 oz (105.2 kg)    Body mass index is 42.42 kg/m.   Constitutional: NAD, calm, comfortable Eyes: PERRL, lids and conjunctivae normal Respiratory: clear to auscultation bilaterally, no wheezing, no crackles. Normal respiratory effort. No accessory muscle use.  Cardiovascular: Regular rate and rhythm, no murmurs / rubs / gallops. No extremity edema. 2+ pedal pulses. No carotid bruits.   Musculoskeletal: no clubbing / cyanosis. No joint deformity upper and lower extremities. Good ROM, no contractures. Normal muscle tone.  Psychiatric: Normal judgment and insight. Alert and oriented x 3. Normal mood.    Impression and Plan:  Chronic right-sided low back pain with right-sided sciatica  Primary osteoarthritis involving multiple joints -3 months of hydrocodone dispensed per pain management contract.  Diabetes mellitus without complication (HCC)  -M8U 6.6; well controlled.   Obesity, morbid, BMI 40.0-49.9 ([place]) -Counseled on lifestyle modifications.    Patient Instructions  Doristine Devoid seeing you today!  Instructions: -attend your mammogram appt at the end of the month -we will refill your hydrocodone medication -we drew your A1C today   DASH Eating Plan DASH stands for "Dietary Approaches to Stop Hypertension." The DASH eating plan is a healthy eating plan that has been shown to reduce high blood pressure (hypertension). It may also reduce your risk for type 2 diabetes, heart disease, and stroke. The DASH eating plan may also help with weight loss. What are tips for following this plan?  General  guidelines  Avoid eating more than 2,300 mg (milligrams) of salt (sodium) a day. If you have hypertension, you may need to reduce your sodium intake to 1,500 mg a day.  Limit alcohol intake to no more than 1 drink a day for nonpregnant women and 2 drinks a day for men. One drink equals 12 oz of beer, 5 oz of wine, or 1 oz of hard liquor.  Work with your health care provider to maintain a healthy body weight or to lose weight. Ask what an ideal weight is for you.  Get at least 30 minutes of exercise that causes your heart to beat faster (aerobic exercise) most days of the week. Activities may include walking, swimming, or biking.  Work with your health care provider or diet and nutrition specialist (dietitian) to adjust your eating plan to your individual calorie needs. Reading food labels   Check food labels for the amount of sodium per serving. Choose foods with less than 5 percent of the Daily Value of sodium. Generally, foods with less than 300 mg of sodium per serving fit into this eating plan.  To find whole grains, look for the word "whole" as the first word in the ingredient list. Shopping  Buy products labeled as "low-sodium" or "no salt added."  Buy fresh foods. Avoid canned foods and premade or frozen meals. Cooking  Avoid adding salt when cooking. Use salt-free seasonings or herbs instead of table salt or sea salt. Check with your health care provider or pharmacist before using salt substitutes.  Do not fry foods. Cook foods using healthy methods such as baking, boiling, grilling, and broiling instead.  Cook with heart-healthy oils, such as olive, canola, soybean, or sunflower oil. Meal planning  Eat a balanced diet that includes: ? 5 or more servings of fruits and vegetables each day. At each meal, try to fill half of your plate with fruits and vegetables. ? Up to 6-8 servings of whole grains each day. ? Less than 6 oz of lean meat, poultry, or fish each day. A 3-oz  serving of meat is about the same size as a deck of cards. One egg equals 1 oz. ? 2 servings of low-fat dairy each day. ? A serving of nuts, seeds, or beans 5 times each week. ? Heart-healthy fats. Healthy fats called Omega-3 fatty acids are found in foods such as flaxseeds and coldwater fish, like sardines, salmon, and mackerel.  Limit how much you eat of the following: ? Canned or prepackaged foods. ? Food that is high in trans fat, such as fried foods. ? Food that is high in saturated fat, such as fatty meat. ? Sweets, desserts, sugary drinks, and other foods with added sugar. ?  Full-fat dairy products.  Do not salt foods before eating.  Try to eat at least 2 vegetarian meals each week.  Eat more home-cooked food and less restaurant, buffet, and fast food.  When eating at a restaurant, ask that your food be prepared with less salt or no salt, if possible. What foods are recommended? The items listed may not be a complete list. Talk with your dietitian about what dietary choices are best for you. Grains Whole-grain or whole-wheat bread. Whole-grain or whole-wheat pasta. Brown rice. [place]. Bulgur. Whole-grain and low-sodium cereals. Pita bread. Low-fat, low-sodium crackers. Whole-wheat flour tortillas. Vegetables Fresh or frozen vegetables (raw, steamed, roasted, or grilled). Low-sodium or reduced-sodium tomato and vegetable juice. Low-sodium or reduced-sodium tomato sauce and tomato paste. Low-sodium or reduced-sodium canned vegetables. Fruits All fresh, dried, or frozen fruit. Canned fruit in natural juice (without added sugar). Meat and other protein foods Skinless chicken or [place]. Ground chicken or [place]. Pork with fat trimmed off. Fish and seafood. Egg whites. Dried beans, peas, or lentils. Unsalted nuts, nut butters, and seeds. Unsalted canned beans. Lean cuts of beef with fat trimmed off. Low-sodium, lean deli meat. Dairy Low-fat (1%) or fat-free (skim) milk.  Fat-free, low-fat, or reduced-fat cheeses. Nonfat, low-sodium ricotta or cottage cheese. Low-fat or nonfat yogurt. Low-fat, low-sodium cheese. Fats and oils Soft margarine without trans fats. Vegetable oil. Low-fat, reduced-fat, or light mayonnaise and salad dressings (reduced-sodium). Canola, safflower, olive, soybean, and sunflower oils. Avocado. Seasoning and other foods Herbs. Spices. Seasoning mixes without salt. Unsalted popcorn and pretzels. Fat-free sweets. What foods are not recommended? The items listed may not be a complete list. Talk with your dietitian about what dietary choices are best for you. Grains Baked goods made with fat, such as croissants, muffins, or some breads. Dry pasta or rice meal packs. Vegetables Creamed or fried vegetables. Vegetables in a cheese sauce. Regular canned vegetables (not low-sodium or reduced-sodium). Regular canned tomato sauce and paste (not low-sodium or reduced-sodium). Regular tomato and vegetable juice (not low-sodium or reduced-sodium). Angie Fava. Olives. Fruits Canned fruit in a light or heavy syrup. Fried fruit. Fruit in cream or butter sauce. Meat and other protein foods Fatty cuts of meat. Ribs. Fried meat. Berniece Salines. Sausage. Bologna and other processed lunch meats. Salami. Fatback. Hotdogs. Bratwurst. Salted nuts and seeds. Canned beans with added salt. Canned or smoked fish. Whole eggs or egg yolks. Chicken or [place] with skin. Dairy Whole or 2% milk, cream, and half-and-half. Whole or full-fat cream cheese. Whole-fat or sweetened yogurt. Full-fat cheese. Nondairy creamers. Whipped toppings. Processed cheese and cheese spreads. Fats and oils Butter. Stick margarine. Lard. Shortening. Ghee. Bacon fat. Tropical oils, such as coconut, palm kernel, or palm oil. Seasoning and other foods Salted popcorn and pretzels. Onion salt, garlic salt, seasoned salt, table salt, and sea salt. Worcestershire sauce. Tartar sauce. Barbecue sauce. Teriyaki sauce.  Soy sauce, including reduced-sodium. Steak sauce. Canned and packaged gravies. Fish sauce. Oyster sauce. Cocktail sauce. Horseradish that you find on the shelf. Ketchup. Mustard. Meat flavorings and tenderizers. Bouillon cubes. Hot sauce and Tabasco sauce. Premade or packaged marinades. Premade or packaged taco seasonings. Relishes. Regular salad dressings. Where to find more information:  National Heart, Lung, and [place]: https://wilson-eaton.com/  American Heart Association: www.heart.org Summary  The DASH eating plan is a healthy eating plan that has been shown to reduce high blood pressure (hypertension). It may also reduce your risk for type 2 diabetes, heart disease, and stroke.  With the DASH eating plan, you  should limit salt (sodium) intake to 2,300 mg a day. If you have hypertension, you may need to reduce your sodium intake to 1,500 mg a day.  When on the DASH eating plan, aim to eat more fresh fruits and vegetables, whole grains, lean proteins, low-fat dairy, and heart-healthy fats.  Work with your health care provider or diet and nutrition specialist (dietitian) to adjust your eating plan to your individual calorie needs. This information is not intended to replace advice given to you by your health care provider. Make sure you discuss any questions you have with your health care provider. Document Released: 06/05/2011 Document Revised: 06/09/2016 Document Reviewed: 06/09/2016 Elsevier Interactive Patient Education  2019 [place], [place] [facility]

## 2018-08-17 DIAGNOSIS — R69 Illness, unspecified: Secondary | ICD-10-CM | POA: Diagnosis not present

## 2018-08-18 DIAGNOSIS — J3089 Other allergic rhinitis: Secondary | ICD-10-CM | POA: Diagnosis not present

## 2018-08-18 DIAGNOSIS — J301 Allergic rhinitis due to pollen: Secondary | ICD-10-CM | POA: Diagnosis not present

## 2018-08-26 DIAGNOSIS — M810 Age-related osteoporosis without current pathological fracture: Secondary | ICD-10-CM | POA: Diagnosis not present

## 2018-08-26 DIAGNOSIS — Z90710 Acquired absence of both cervix and uterus: Secondary | ICD-10-CM | POA: Diagnosis not present

## 2018-08-26 DIAGNOSIS — R634 Abnormal weight loss: Secondary | ICD-10-CM | POA: Diagnosis not present

## 2018-08-26 DIAGNOSIS — Z853 Personal history of malignant neoplasm of breast: Secondary | ICD-10-CM | POA: Diagnosis not present

## 2018-08-26 DIAGNOSIS — Z1231 Encounter for screening mammogram for malignant neoplasm of breast: Secondary | ICD-10-CM | POA: Diagnosis not present

## 2018-08-26 LAB — HM DEXA SCAN

## 2018-08-26 LAB — HM MAMMOGRAPHY

## 2018-08-31 ENCOUNTER — Encounter: Payer: Self-pay | Admitting: Internal Medicine

## 2018-09-06 DIAGNOSIS — J301 Allergic rhinitis due to pollen: Secondary | ICD-10-CM | POA: Diagnosis not present

## 2018-09-06 DIAGNOSIS — J3089 Other allergic rhinitis: Secondary | ICD-10-CM | POA: Diagnosis not present

## 2018-09-07 ENCOUNTER — Encounter: Payer: Self-pay | Admitting: Internal Medicine

## 2018-09-07 DIAGNOSIS — H40033 Anatomical narrow angle, bilateral: Secondary | ICD-10-CM | POA: Diagnosis not present

## 2018-09-07 DIAGNOSIS — H2513 Age-related nuclear cataract, bilateral: Secondary | ICD-10-CM | POA: Diagnosis not present

## 2018-09-07 DIAGNOSIS — H1851 Endothelial corneal dystrophy: Secondary | ICD-10-CM | POA: Diagnosis not present

## 2018-09-09 DIAGNOSIS — H40033 Anatomical narrow angle, bilateral: Secondary | ICD-10-CM | POA: Diagnosis not present

## 2018-09-20 DIAGNOSIS — H18519 Endothelial corneal dystrophy, unspecified eye: Secondary | ICD-10-CM | POA: Insufficient documentation

## 2018-09-27 ENCOUNTER — Telehealth: Payer: Self-pay | Admitting: Internal Medicine

## 2018-09-27 NOTE — Telephone Encounter (Signed)
Copied from [place] [phone]. Topic: Quick Communication - See Telephone Encounter >> Sep 27, 2018  3:14 PM Blase Mess A wrote: CRM for notification. See Telephone encounter for: 09/27/18  Brayton Layman calling from [employer] is calling regarding the patient's 05/25/2018 TOC with Dr. Jerilee Hoh. Submited as a specialist visit. Can the claim be resubmitted Please advise CB- [phone]

## 2018-09-27 NOTE — Telephone Encounter (Signed)
Can you help with this? Thanks

## 2018-09-28 ENCOUNTER — Telehealth: Payer: Self-pay | Admitting: *Deleted

## 2018-09-28 NOTE — Telephone Encounter (Signed)
Not a new medications. What issues is she having?

## 2018-09-28 NOTE — Telephone Encounter (Signed)
A prescription for Januvia has been denied by her insurance due to the cost. Please advise.

## 2018-09-29 DIAGNOSIS — J3089 Other allergic rhinitis: Secondary | ICD-10-CM | POA: Diagnosis not present

## 2018-09-29 DIAGNOSIS — J301 Allergic rhinitis due to pollen: Secondary | ICD-10-CM | POA: Diagnosis not present

## 2018-09-29 NOTE — Telephone Encounter (Signed)
Patient will call her insurance company to find out what is covered and will call us back.

## 2018-10-18 DIAGNOSIS — J301 Allergic rhinitis due to pollen: Secondary | ICD-10-CM | POA: Diagnosis not present

## 2018-10-18 DIAGNOSIS — J3089 Other allergic rhinitis: Secondary | ICD-10-CM | POA: Diagnosis not present

## 2018-10-28 ENCOUNTER — Other Ambulatory Visit: Payer: Self-pay | Admitting: *Deleted

## 2018-10-28 DIAGNOSIS — R69 Illness, unspecified: Secondary | ICD-10-CM | POA: Diagnosis not present

## 2018-10-28 MED ORDER — GLUCOSE BLOOD VI STRP: ORAL_STRIP | 4 refills | Status: DC

## 2018-11-02 ENCOUNTER — Other Ambulatory Visit: Payer: Self-pay | Admitting: *Deleted

## 2018-11-02 MED ORDER — POLYSACCHARIDE IRON COMPLEX 150 MG PO CAPS: 150.0000 mg | ORAL_CAPSULE | Freq: Every day | ORAL | 1 refills | Status: DC

## 2018-11-09 DIAGNOSIS — J3089 Other allergic rhinitis: Secondary | ICD-10-CM | POA: Diagnosis not present

## 2018-11-09 DIAGNOSIS — J301 Allergic rhinitis due to pollen: Secondary | ICD-10-CM | POA: Diagnosis not present

## 2018-11-11 ENCOUNTER — Ambulatory Visit (INDEPENDENT_AMBULATORY_CARE_PROVIDER_SITE_OTHER): Payer: Medicare HMO | Admitting: Internal Medicine

## 2018-11-11 ENCOUNTER — Telehealth: Payer: Self-pay | Admitting: *Deleted

## 2018-11-11 ENCOUNTER — Other Ambulatory Visit: Payer: Self-pay

## 2018-11-11 DIAGNOSIS — E119 Type 2 diabetes mellitus without complications: Secondary | ICD-10-CM

## 2018-11-11 DIAGNOSIS — M5441 Lumbago with sciatica, right side: Secondary | ICD-10-CM

## 2018-11-11 DIAGNOSIS — G8929 Other chronic pain: Secondary | ICD-10-CM

## 2018-11-11 MED ORDER — HYDROCODONE-ACETAMINOPHEN 5-325 MG PO TABS: 1.0000 | ORAL_TABLET | Freq: Two times a day (BID) | ORAL | 0 refills | Status: DC | PRN

## 2018-11-11 NOTE — Telephone Encounter (Signed)
Copied from [place] [id number]. Topic: Appointment Scheduling - Scheduling Inquiry for Clinic >> Nov 10, 2018  4:56 PM Rutherford Nail, [place] wrote: Reason for CRM: Patient calling and states that she would like to make an appointment to get her A1C checked and for a medication refill.  CB#: [phone]

## 2018-11-11 NOTE — Progress Notes (Signed)
Virtual Visit via Telephone Note  I connected with Doris Jackson on 11/11/18 at  2:30 PM EDT by telephone and verified that I am speaking with the correct person using two identifiers.   I discussed the limitations, risks, security and privacy concerns of performing an evaluation and management service by telephone and the availability of in person appointments. I also discussed with the patient that there may be a patient responsible charge related to this service. The patient expressed understanding and agreed to proceed.  We initially attempted to connect via video chat but were unable to due to technical difficulties on the patient's end, so we converted this visit to a phone visit.   Location patient: home Location provider: work office Participants present for the call: patient, provider Patient did not have a visit in the prior 7 days to address this/these issue(s).   History of Present Illness:  This is a scheduled visit for opioid refills per pain management contract. She has been using them as prescribed. On Saturday her husband had mopped the floor and she slid down, no injuries, was able to get up on her own, has been ambulating just fine since. Past Medical History is significant for: Morbid obesity, remote history of breast cancer, type 2 diabetes, hyperlipidemia, hypertension.  Export printed, initialed  and scanned into the EMR.   Indication for chronic opioid: chronic LBP with sciatica, OA of bilateral knees Medication and dose: Hydrocodone 5/325 q 6 hrs PRN # pills per month: #60 tabs a month Last UDS date: Oct 2018 Opioid Treatment Agreement signed: yes Opioid Treatment Agreement last reviewed with patient: 05/25/2018 [facility] reviewed this encounter (include red flags):  yes, no red flags, overdose risk score of 50.    Observations/Objective: Patient sounds cheerful and well on the phone. I do not appreciate any increased work of breathing. Speech and  thought processing are grossly intact. Patient reported vitals: none reported   Current Outpatient Medications:  .  amLODipine-benazepril (LOTREL) 5-40 MG capsule, TAKE 1 CAPSULE BY MOUTH EVERY DAY, Disp: 90 capsule, Rfl: 4 .  Ascorbic Acid (VITAMIN C) 1000 MG tablet, Take 1,000 mg by mouth 2 (two) times daily., Disp: , Rfl:  .  Azelastine-Fluticasone 137-50 MCG/ACT SUSP, Place 1 spray into both nostrils daily., Disp: 1 Bottle, Rfl: 2 .  Cholecalciferol (VITAMIN D) 2000 UNITS CAPS, Take 1 capsule by mouth daily. Taking 2 caps daily 4,000, Disp: , Rfl:  .  cyanocobalamin 2000 MCG tablet, Take 2,000 mcg by mouth daily.  , Disp: , Rfl:  .  cyclobenzaprine (FLEXERIL) 5 MG tablet, Take 1 tablet (5 mg total) by mouth at bedtime., Disp: 60 tablet, Rfl: 3 .  fexofenadine (ALLEGRA) 180 MG tablet, Take 180 mg by mouth daily as needed. , Disp: , Rfl:  .  furosemide (LASIX) 20 MG tablet, Take 1 tablet (20 mg total) by mouth daily as needed., Disp: 90 tablet, Rfl: 4 .  glipiZIDE (GLUCOTROL XL) 5 MG 24 hr tablet, Take 1 tablet (5 mg total) by mouth daily., Disp: 90 tablet, Rfl: 3 .  glucose blood (ONETOUCH VERIO) test strip, TEST TWICE A DAY.  Dx E11.9, Disp: 200 each, Rfl: 4 .  hydrochlorothiazide (HYDRODIURIL) 25 MG tablet, Take 1 tablet (25 mg total) by mouth daily., Disp: 90 tablet, Rfl: 3 .  HYDROcodone-acetaminophen (NORCO/VICODIN) 5-325 MG tablet, Take 1 tablet by mouth 2 (two) times daily as needed for moderate pain., Disp: 60 tablet, Rfl: 0 .  HYDROcodone-acetaminophen (NORCO/VICODIN) 5-325  MG tablet, Take 1 tablet by mouth 2 (two) times daily as needed., Disp: 60 tablet, Rfl: 0 .  HYDROcodone-acetaminophen (NORCO/VICODIN) 5-325 MG tablet, Take 1 tablet by mouth 2 (two) times daily as needed for moderate pain., Disp: 60 tablet, Rfl: 0 .  iron polysaccharides (FERREX 150) 150 MG capsule, Take 1 capsule (150 mg total) by mouth daily., Disp: 90 capsule, Rfl: 1 .  Lancets 30G MISC, USE TWICE DAILY AS  DIRECTED FOR GLUCOSE TESTING AND MONITORING, Disp: 100 each, Rfl: 4 .  Magnesium 250 MG TABS, Take 1 tablet by mouth as needed. , Disp: , Rfl:  .  metFORMIN (GLUCOPHAGE) 1000 MG tablet, Take 1 tablet (1,000 mg total) by mouth 2 (two) times daily with a meal., Disp: 180 tablet, Rfl: 5 .  metoprolol succinate (TOPROL-XL) 50 MG 24 hr tablet, TAKE 1 TABLET BY MOUTH EVERY MORNING. TAKE WITH OR IMMEDIATELY FOLLOWING A MEAL., Disp: 90 tablet, Rfl: 3 .  Multiple Vitamin (MULTIVITAMIN) tablet, Take 1 tablet by mouth daily.  , Disp: , Rfl:  .  ONE TOUCH LANCETS MISC, Test twice daily., Disp: 200 each, Rfl: 5 .  pioglitazone (ACTOS) 45 MG tablet, Take 1 tablet (45 mg total) by mouth daily., Disp: 90 tablet, Rfl: 1 .  pravastatin (PRAVACHOL) 40 MG tablet, Take 1 tablet (40 mg total) by mouth daily., Disp: 90 tablet, Rfl: 4 .  sitaGLIPtin (JANUVIA) 100 MG tablet, Take 1 tablet (100 mg total) by mouth daily., Disp: 90 tablet, Rfl: 2 .  triamcinolone cream (KENALOG) 0.1 %, Apply 1 application topically 2 (two) times daily., Disp: 30 g, Rfl: 3  Review of Systems:  Constitutional: Denies fever, chills, diaphoresis, appetite change and fatigue.  HEENT: Denies photophobia, eye pain, redness, hearing loss, ear pain, congestion, sore throat, rhinorrhea, sneezing, mouth sores, trouble swallowing, neck pain, neck stiffness and tinnitus.   Respiratory: Denies SOB, DOE, cough, chest tightness,  and wheezing.   Cardiovascular: Denies chest pain, palpitations and leg swelling.  Gastrointestinal: Denies nausea, vomiting, abdominal pain, diarrhea, constipation, blood in stool and abdominal distention.  Genitourinary: Denies dysuria, urgency, frequency, hematuria, flank pain and difficulty urinating.  Endocrine: Denies: hot or cold intolerance, sweats, changes in hair or nails, polyuria, polydipsia. Musculoskeletal: Denies myalgias, back pain, joint swelling, arthralgias and gait problem.  Skin: Denies pallor, rash and  wound.  Neurological: Denies dizziness, seizures, syncope, weakness, light-headedness, numbness and headaches.  Hematological: Denies adenopathy. Easy bruising, personal or family bleeding history  Psychiatric/Behavioral: Denies suicidal ideation, mood changes, confusion, nervousness, sleep disturbance and agitation   Assessment and Plan:  Chronic right-sided low back pain with right-sided sciatica -Refills of hydrocodone provided per opioid contract.   Diabetes mellitus without complication ([place]) -Last A1c was 6.6 in 2/20. -CBGs at home have ranged between 109-140. -Follow up in office in 3 months for repeat A1c.    I discussed the assessment and treatment plan with the patient. The patient was provided an opportunity to ask questions and all were answered. The patient agreed with the plan and demonstrated an understanding of the instructions.   The patient was advised to call back or seek an in-person evaluation if the symptoms worsen or if the condition fails to improve as anticipated.  I provided 15 minutes of non-face-to-face time during this encounter.   Lelon Frohlich, MD [facility]

## 2018-11-11 NOTE — Telephone Encounter (Signed)
Attempted to call patient to schedule an appointment. Will call again at a another time.

## 2018-11-11 NOTE — Telephone Encounter (Signed)
Appointment made

## 2018-11-15 DIAGNOSIS — J3089 Other allergic rhinitis: Secondary | ICD-10-CM | POA: Diagnosis not present

## 2018-11-15 DIAGNOSIS — J301 Allergic rhinitis due to pollen: Secondary | ICD-10-CM | POA: Diagnosis not present

## 2018-12-02 ENCOUNTER — Telehealth: Payer: Self-pay | Admitting: Internal Medicine

## 2018-12-02 DIAGNOSIS — J3089 Other allergic rhinitis: Secondary | ICD-10-CM | POA: Diagnosis not present

## 2018-12-02 DIAGNOSIS — J301 Allergic rhinitis due to pollen: Secondary | ICD-10-CM | POA: Diagnosis not present

## 2018-12-02 NOTE — Telephone Encounter (Signed)
Copied from [place] [phone]. Topic: Quick Communication - Rx Refill/Question >> Dec 02, 2018 12:55 PM Oneta Rack wrote: Medication: sitaGLIPtin (JANUVIA) 100 MG tablet (costing patient $144.00 and would like to know if there's an alternate)    Preferred Pharmacy (with phone number or street name):   [id number] [address], [place] [phone] (Phone) [phone] (Fax)    Agent: Please be advised that RX refills may take up to 3 business days. We ask that you follow-up with your pharmacy.

## 2018-12-03 NOTE — Telephone Encounter (Signed)
Have LVM for Doris Jackson in credentialing to review

## 2018-12-03 NOTE — Telephone Encounter (Signed)
Pt calling back to ask if someone can PLEASE fix this issue with Dr Jerilee Hoh being billed by her insurance as a specialist?  Pt states she has gotten 2 bills and also need to be reimbursed for what she paid. She states [facility] states they cannot get in touch with the right person.

## 2018-12-09 NOTE — Telephone Encounter (Signed)
Left detailed message on machine for patient to call her insurance company

## 2018-12-13 ENCOUNTER — Other Ambulatory Visit: Payer: Self-pay | Admitting: Internal Medicine

## 2018-12-14 DIAGNOSIS — H2513 Age-related nuclear cataract, bilateral: Secondary | ICD-10-CM | POA: Diagnosis not present

## 2018-12-14 DIAGNOSIS — H40033 Anatomical narrow angle, bilateral: Secondary | ICD-10-CM | POA: Diagnosis not present

## 2018-12-14 DIAGNOSIS — H1851 Endothelial corneal dystrophy: Secondary | ICD-10-CM | POA: Diagnosis not present

## 2018-12-20 DIAGNOSIS — H2513 Age-related nuclear cataract, bilateral: Secondary | ICD-10-CM | POA: Diagnosis not present

## 2018-12-21 ENCOUNTER — Telehealth: Payer: Self-pay | Admitting: *Deleted

## 2018-12-21 DIAGNOSIS — J301 Allergic rhinitis due to pollen: Secondary | ICD-10-CM | POA: Diagnosis not present

## 2018-12-21 DIAGNOSIS — J3089 Other allergic rhinitis: Secondary | ICD-10-CM | POA: Diagnosis not present

## 2018-12-21 NOTE — Telephone Encounter (Signed)
Left message on machine for patient to return our call to schedule an appointment. 

## 2018-12-21 NOTE — Telephone Encounter (Signed)
Copied from [place] [phone]. Topic: General - Inquiry >> Dec 21, 2018  1:46 PM Richardo Priest, [place] wrote: Reason for CRM: Patient is calling in stating she has been experiencing some loose stools since the first of the year. States she believes it may be her diabetic medication and would like an alternative. Please advise.

## 2018-12-23 DIAGNOSIS — J301 Allergic rhinitis due to pollen: Secondary | ICD-10-CM | POA: Diagnosis not present

## 2018-12-23 DIAGNOSIS — J3089 Other allergic rhinitis: Secondary | ICD-10-CM | POA: Diagnosis not present

## 2018-12-24 ENCOUNTER — Other Ambulatory Visit: Payer: Self-pay

## 2018-12-24 ENCOUNTER — Ambulatory Visit (INDEPENDENT_AMBULATORY_CARE_PROVIDER_SITE_OTHER): Payer: Medicare HMO | Admitting: Internal Medicine

## 2018-12-24 DIAGNOSIS — R197 Diarrhea, unspecified: Secondary | ICD-10-CM | POA: Diagnosis not present

## 2018-12-24 MED ORDER — METFORMIN HCL 1000 MG PO TABS: 500.0000 mg | ORAL_TABLET | Freq: Two times a day (BID) | ORAL | 5 refills | Status: DC

## 2018-12-24 MED ORDER — SACCHAROMYCES BOULARDII 250 MG PO CAPS: 250.0000 mg | ORAL_CAPSULE | Freq: Two times a day (BID) | ORAL | 1 refills | Status: DC

## 2018-12-24 NOTE — Telephone Encounter (Signed)
Telephone visit scheduled

## 2018-12-24 NOTE — Telephone Encounter (Signed)
Copied from [place] [phone]. Topic: Appointment Scheduling - Scheduling Inquiry for Clinic >> Dec 23, 2018  4:43 PM Rainey Pines A wrote: Patient would like a callback in regards to appointment and returning [place] call.

## 2018-12-24 NOTE — Progress Notes (Signed)
Virtual Visit via Telephone Note  I connected with [name] on 12/24/18 at  2:00 PM EDT by telephone and verified that I am speaking with the correct person using two identifiers.   I discussed the limitations, risks, security and privacy concerns of performing an evaluation and management service by telephone and the availability of in person appointments. I also discussed with the patient that there may be a patient responsible charge related to this service. The patient expressed understanding and agreed to proceed.  Location patient: home Location provider: work office Participants present for the call: patient, provider Patient did not have a visit in the prior 7 days to address this/these issue(s).   History of Present Illness:  She has scheduled this visit to discuss diarrhea. This started around January but has become more frequent. She notices it every time she takes her metformin or eats. It is watery, she also gets some stomach upset with it. She uses pepto bismol with some relief. She had this with metformin in the past and is concerned this may be the reason.   Observations/Objective: Patient sounds cheerful and well on the phone. I do not appreciate any increased work of breathing. Speech and thought processing are grossly intact. Patient reported vitals: none reported   Current Outpatient Medications:  .  amLODipine-benazepril (LOTREL) 5-40 MG capsule, TAKE 1 CAPSULE BY MOUTH EVERY DAY, Disp: 90 capsule, Rfl: 4 .  Ascorbic Acid (VITAMIN C) 1000 MG tablet, Take 1,000 mg by mouth 2 (two) times daily., Disp: , Rfl:  .  Azelastine-Fluticasone 137-50 MCG/ACT SUSP, Place 1 spray into both nostrils daily., Disp: 1 Bottle, Rfl: 2 .  Cholecalciferol (VITAMIN D) 2000 UNITS CAPS, Take 1 capsule by mouth daily. Taking 2 caps daily 4,000, Disp: , Rfl:  .  cyanocobalamin 2000 MCG tablet, Take 2,000 mcg by mouth daily.  , Disp: , Rfl:  .  cyclobenzaprine (FLEXERIL) 5 MG  tablet, Take 1 tablet (5 mg total) by mouth at bedtime., Disp: 60 tablet, Rfl: 3 .  fexofenadine (ALLEGRA) 180 MG tablet, Take 180 mg by mouth daily as needed. , Disp: , Rfl:  .  furosemide (LASIX) 20 MG tablet, Take 1 tablet (20 mg total) by mouth daily as needed., Disp: 90 tablet, Rfl: 4 .  glipiZIDE (GLUCOTROL XL) 5 MG 24 hr tablet, Take 1 tablet (5 mg total) by mouth daily., Disp: 90 tablet, Rfl: 3 .  glucose blood (ONETOUCH VERIO) test strip, TEST TWICE A DAY.  Dx E11.9, Disp: 200 each, Rfl: 4 .  hydrochlorothiazide (HYDRODIURIL) 25 MG tablet, Take 1 tablet (25 mg total) by mouth daily., Disp: 90 tablet, Rfl: 3 .  HYDROcodone-acetaminophen (NORCO/VICODIN) 5-325 MG tablet, Take 1 tablet by mouth 2 (two) times daily as needed for moderate pain., Disp: 60 tablet, Rfl: 0 .  HYDROcodone-acetaminophen (NORCO/VICODIN) 5-325 MG tablet, Take 1 tablet by mouth 2 (two) times daily as needed., Disp: 60 tablet, Rfl: 0 .  HYDROcodone-acetaminophen (NORCO/VICODIN) 5-325 MG tablet, Take 1 tablet by mouth 2 (two) times daily as needed for moderate pain., Disp: 60 tablet, Rfl: 0 .  iron polysaccharides (FERREX 150) 150 MG capsule, Take 1 capsule (150 mg total) by mouth daily., Disp: 90 capsule, Rfl: 1 .  Lancets 30G MISC, USE TWICE DAILY AS DIRECTED FOR GLUCOSE TESTING AND MONITORING, Disp: 100 each, Rfl: 4 .  Magnesium 250 MG TABS, Take 1 tablet by mouth as needed. , Disp: , Rfl:  .  metFORMIN (GLUCOPHAGE) 1000 MG tablet,  Take 1 tablet (1,000 mg total) by mouth 2 (two) times daily with a meal., Disp: 180 tablet, Rfl: 5 .  metoprolol succinate (TOPROL-XL) 50 MG 24 hr tablet, TAKE 1 TABLET BY MOUTH EVERY MORNING. TAKE WITH OR IMMEDIATELY FOLLOWING A MEAL., Disp: 90 tablet, Rfl: 3 .  Multiple Vitamin (MULTIVITAMIN) tablet, Take 1 tablet by mouth daily.  , Disp: , Rfl:  .  ONE TOUCH LANCETS MISC, Test twice daily., Disp: 200 each, Rfl: 5 .  pioglitazone (ACTOS) 45 MG tablet, TAKE 1 TABLET BY MOUTH EVERY DAY, Disp:  90 tablet, Rfl: 1 .  pravastatin (PRAVACHOL) 40 MG tablet, Take 1 tablet (40 mg total) by mouth daily., Disp: 90 tablet, Rfl: 4 .  sitaGLIPtin (JANUVIA) 100 MG tablet, Take 1 tablet (100 mg total) by mouth daily., Disp: 90 tablet, Rfl: 2 .  triamcinolone cream (KENALOG) 0.1 %, Apply 1 application topically 2 (two) times daily., Disp: 30 g, Rfl: 3  Review of Systems:  Constitutional: Denies fever, chills, diaphoresis, appetite change and fatigue.  HEENT: Denies photophobia, eye pain, redness, hearing loss, ear pain, congestion, sore throat, rhinorrhea, sneezing, mouth sores, trouble swallowing, neck pain, neck stiffness and tinnitus.   Respiratory: Denies SOB, DOE, cough, chest tightness,  and wheezing.   Cardiovascular: Denies chest pain, palpitations and leg swelling.  Gastrointestinal: Denies nausea, vomiting, abdominal pain, diarrhea, constipation, blood in stool and abdominal distention.  Genitourinary: Denies dysuria, urgency, frequency, hematuria, flank pain and difficulty urinating.  Endocrine: Denies: hot or cold intolerance, sweats, changes in hair or nails, polyuria, polydipsia. Musculoskeletal: Denies myalgias, back pain, joint swelling, arthralgias and gait problem.  Skin: Denies pallor, rash and wound.  Neurological: Denies dizziness, seizures, syncope, weakness, light-headedness, numbness and headaches.  Hematological: Denies adenopathy. Easy bruising, personal or family bleeding history  Psychiatric/Behavioral: Denies suicidal ideation, mood changes, confusion, nervousness, sleep disturbance and agitation   Assessment and Plan:  Diarrhea, unspecified type -Likely GI upset from metformin use. -Cut metformin in half from 1000 to 500 BID, last A1c was 6.5. -Florastor BID.   I discussed the assessment and treatment plan with the patient. The patient was provided an opportunity to ask questions and all were answered. The patient agreed with the plan and demonstrated an  understanding of the instructions.   The patient was advised to call back or seek an in-person evaluation if the symptoms worsen or if the condition fails to improve as anticipated.  I provided 13 minutes of non-face-to-face time during this encounter.   Lelon Frohlich, MD [facility] at [facility]

## 2018-12-27 DIAGNOSIS — J3089 Other allergic rhinitis: Secondary | ICD-10-CM | POA: Diagnosis not present

## 2018-12-27 DIAGNOSIS — J301 Allergic rhinitis due to pollen: Secondary | ICD-10-CM | POA: Diagnosis not present

## 2018-12-30 DIAGNOSIS — J301 Allergic rhinitis due to pollen: Secondary | ICD-10-CM | POA: Diagnosis not present

## 2018-12-30 DIAGNOSIS — J3089 Other allergic rhinitis: Secondary | ICD-10-CM | POA: Diagnosis not present

## 2018-12-31 DIAGNOSIS — H2513 Age-related nuclear cataract, bilateral: Secondary | ICD-10-CM | POA: Diagnosis not present

## 2018-12-31 DIAGNOSIS — H1851 Endothelial corneal dystrophy: Secondary | ICD-10-CM | POA: Diagnosis not present

## 2018-12-31 DIAGNOSIS — H40033 Anatomical narrow angle, bilateral: Secondary | ICD-10-CM | POA: Diagnosis not present

## 2018-12-31 DIAGNOSIS — Z1159 Encounter for screening for other viral diseases: Secondary | ICD-10-CM | POA: Diagnosis not present

## 2018-12-31 DIAGNOSIS — Z01812 Encounter for preprocedural laboratory examination: Secondary | ICD-10-CM | POA: Diagnosis not present

## 2018-12-31 DIAGNOSIS — I10 Essential (primary) hypertension: Secondary | ICD-10-CM | POA: Diagnosis not present

## 2018-12-31 DIAGNOSIS — E119 Type 2 diabetes mellitus without complications: Secondary | ICD-10-CM | POA: Diagnosis not present

## 2019-01-02 DIAGNOSIS — H2510 Age-related nuclear cataract, unspecified eye: Secondary | ICD-10-CM | POA: Insufficient documentation

## 2019-01-03 DIAGNOSIS — H2511 Age-related nuclear cataract, right eye: Secondary | ICD-10-CM | POA: Diagnosis not present

## 2019-01-03 DIAGNOSIS — E1136 Type 2 diabetes mellitus with diabetic cataract: Secondary | ICD-10-CM | POA: Diagnosis not present

## 2019-01-03 DIAGNOSIS — I10 Essential (primary) hypertension: Secondary | ICD-10-CM | POA: Diagnosis not present

## 2019-01-03 DIAGNOSIS — H1851 Endothelial corneal dystrophy: Secondary | ICD-10-CM | POA: Diagnosis not present

## 2019-01-03 DIAGNOSIS — H40033 Anatomical narrow angle, bilateral: Secondary | ICD-10-CM | POA: Diagnosis not present

## 2019-01-03 DIAGNOSIS — E669 Obesity, unspecified: Secondary | ICD-10-CM | POA: Diagnosis not present

## 2019-01-03 DIAGNOSIS — E785 Hyperlipidemia, unspecified: Secondary | ICD-10-CM | POA: Diagnosis not present

## 2019-01-03 DIAGNOSIS — Z7984 Long term (current) use of oral hypoglycemic drugs: Secondary | ICD-10-CM | POA: Diagnosis not present

## 2019-01-03 DIAGNOSIS — Z79899 Other long term (current) drug therapy: Secondary | ICD-10-CM | POA: Diagnosis not present

## 2019-01-04 DIAGNOSIS — Z961 Presence of intraocular lens: Secondary | ICD-10-CM | POA: Insufficient documentation

## 2019-01-04 DIAGNOSIS — Z9841 Cataract extraction status, right eye: Secondary | ICD-10-CM | POA: Insufficient documentation

## 2019-01-04 DIAGNOSIS — Z947 Corneal transplant status: Secondary | ICD-10-CM | POA: Insufficient documentation

## 2019-01-05 DIAGNOSIS — J301 Allergic rhinitis due to pollen: Secondary | ICD-10-CM | POA: Diagnosis not present

## 2019-01-05 DIAGNOSIS — J3089 Other allergic rhinitis: Secondary | ICD-10-CM | POA: Diagnosis not present

## 2019-01-24 DIAGNOSIS — J301 Allergic rhinitis due to pollen: Secondary | ICD-10-CM | POA: Diagnosis not present

## 2019-01-24 DIAGNOSIS — J3089 Other allergic rhinitis: Secondary | ICD-10-CM | POA: Diagnosis not present

## 2019-01-28 DIAGNOSIS — R69 Illness, unspecified: Secondary | ICD-10-CM | POA: Diagnosis not present

## 2019-02-15 DIAGNOSIS — H52223 Regular astigmatism, bilateral: Secondary | ICD-10-CM | POA: Diagnosis not present

## 2019-02-15 DIAGNOSIS — H1851 Endothelial corneal dystrophy: Secondary | ICD-10-CM | POA: Diagnosis not present

## 2019-02-15 DIAGNOSIS — Z961 Presence of intraocular lens: Secondary | ICD-10-CM | POA: Diagnosis not present

## 2019-02-15 DIAGNOSIS — H5202 Hypermetropia, left eye: Secondary | ICD-10-CM | POA: Diagnosis not present

## 2019-02-15 DIAGNOSIS — H5211 Myopia, right eye: Secondary | ICD-10-CM | POA: Diagnosis not present

## 2019-02-16 DIAGNOSIS — J301 Allergic rhinitis due to pollen: Secondary | ICD-10-CM | POA: Diagnosis not present

## 2019-02-16 DIAGNOSIS — J3089 Other allergic rhinitis: Secondary | ICD-10-CM | POA: Diagnosis not present

## 2019-02-28 DIAGNOSIS — J3089 Other allergic rhinitis: Secondary | ICD-10-CM | POA: Diagnosis not present

## 2019-02-28 DIAGNOSIS — T781XXD Other adverse food reactions, not elsewhere classified, subsequent encounter: Secondary | ICD-10-CM | POA: Diagnosis not present

## 2019-03-09 ENCOUNTER — Other Ambulatory Visit: Payer: Self-pay

## 2019-03-09 ENCOUNTER — Ambulatory Visit (INDEPENDENT_AMBULATORY_CARE_PROVIDER_SITE_OTHER): Payer: Medicare HMO | Admitting: Internal Medicine

## 2019-03-09 DIAGNOSIS — R059 Cough, unspecified: Secondary | ICD-10-CM

## 2019-03-09 DIAGNOSIS — R05 Cough: Secondary | ICD-10-CM | POA: Diagnosis not present

## 2019-03-09 MED ORDER — BENZONATATE 200 MG PO CAPS: 200.0000 mg | ORAL_CAPSULE | Freq: Two times a day (BID) | ORAL | 0 refills | Status: DC | PRN

## 2019-03-09 NOTE — Progress Notes (Signed)
Virtual Visit via Telephone Note  I connected with Doris Jackson on 03/09/19 at 11:30 AM EDT by telephone and verified that I am speaking with the correct person using two identifiers.   I discussed the limitations, risks, security and privacy concerns of performing an evaluation and management service by telephone and the availability of in person appointments. I also discussed with the patient that there may be a patient responsible charge related to this service. The patient expressed understanding and agreed to proceed.  Location patient: home Location provider: work office Participants present for the call: patient, provider Patient did not have a visit in the prior 7 days to address this/these issue(s).   History of Present Illness:  She wants a refill of benzonatate. She has seasonal allergies. Saw her allergist last week who gave some azteline nasal spray. She has had an occasional dry cough, no SOB, no fevers, no known COVID exposures. Her diarrhea has improved since we last talked with cutting metformin dose in half and Florastor. Needs A1c soon.   Observations/Objective: Patient sounds cheerful and well on the phone. I do not appreciate any increased work of breathing. Speech and thought processing are grossly intact. Patient reported vitals: none reported   Current Outpatient Medications:  .  amLODipine-benazepril (LOTREL) 5-40 MG capsule, TAKE 1 CAPSULE BY MOUTH EVERY DAY, Disp: 90 capsule, Rfl: 4 .  Ascorbic Acid (VITAMIN C) 1000 MG tablet, Take 1,000 mg by mouth 2 (two) times daily., Disp: , Rfl:  .  Azelastine-Fluticasone 137-50 MCG/ACT SUSP, Place 1 spray into both nostrils daily., Disp: 1 Bottle, Rfl: 2 .  benzonatate (TESSALON) 200 MG capsule, Take 1 capsule (200 mg total) by mouth 2 (two) times daily as needed for cough., Disp: 20 capsule, Rfl: 0 .  Cholecalciferol (VITAMIN D) 2000 UNITS CAPS, Take 1 capsule by mouth daily. Taking 2 caps daily 4,000, Disp: , Rfl:   .  cyanocobalamin 2000 MCG tablet, Take 2,000 mcg by mouth daily.  , Disp: , Rfl:  .  cyclobenzaprine (FLEXERIL) 5 MG tablet, Take 1 tablet (5 mg total) by mouth at bedtime., Disp: 60 tablet, Rfl: 3 .  fexofenadine (ALLEGRA) 180 MG tablet, Take 180 mg by mouth daily as needed. , Disp: , Rfl:  .  furosemide (LASIX) 20 MG tablet, Take 1 tablet (20 mg total) by mouth daily as needed., Disp: 90 tablet, Rfl: 4 .  glipiZIDE (GLUCOTROL XL) 5 MG 24 hr tablet, Take 1 tablet (5 mg total) by mouth daily., Disp: 90 tablet, Rfl: 3 .  glucose blood (ONETOUCH VERIO) test strip, TEST TWICE A DAY.  Dx E11.9, Disp: 200 each, Rfl: 4 .  hydrochlorothiazide (HYDRODIURIL) 25 MG tablet, Take 1 tablet (25 mg total) by mouth daily., Disp: 90 tablet, Rfl: 3 .  HYDROcodone-acetaminophen (NORCO/VICODIN) 5-325 MG tablet, Take 1 tablet by mouth 2 (two) times daily as needed for moderate pain., Disp: 60 tablet, Rfl: 0 .  HYDROcodone-acetaminophen (NORCO/VICODIN) 5-325 MG tablet, Take 1 tablet by mouth 2 (two) times daily as needed., Disp: 60 tablet, Rfl: 0 .  HYDROcodone-acetaminophen (NORCO/VICODIN) 5-325 MG tablet, Take 1 tablet by mouth 2 (two) times daily as needed for moderate pain., Disp: 60 tablet, Rfl: 0 .  iron polysaccharides (FERREX 150) 150 MG capsule, Take 1 capsule (150 mg total) by mouth daily., Disp: 90 capsule, Rfl: 1 .  Lancets 30G MISC, USE TWICE DAILY AS DIRECTED FOR GLUCOSE TESTING AND MONITORING, Disp: 100 each, Rfl: 4 .  Magnesium 250 MG  TABS, Take 1 tablet by mouth as needed. , Disp: , Rfl:  .  metFORMIN (GLUCOPHAGE) 1000 MG tablet, Take 0.5 tablets (500 mg total) by mouth 2 (two) times daily with a meal., Disp: 180 tablet, Rfl: 5 .  metoprolol succinate (TOPROL-XL) 50 MG 24 hr tablet, TAKE 1 TABLET BY MOUTH EVERY MORNING. TAKE WITH OR IMMEDIATELY FOLLOWING A MEAL., Disp: 90 tablet, Rfl: 3 .  Multiple Vitamin (MULTIVITAMIN) tablet, Take 1 tablet by mouth daily.  , Disp: , Rfl:  .  ONE TOUCH LANCETS MISC,  Test twice daily., Disp: 200 each, Rfl: 5 .  pioglitazone (ACTOS) 45 MG tablet, TAKE 1 TABLET BY MOUTH EVERY DAY, Disp: 90 tablet, Rfl: 1 .  pravastatin (PRAVACHOL) 40 MG tablet, Take 1 tablet (40 mg total) by mouth daily., Disp: 90 tablet, Rfl: 4 .  saccharomyces boulardii (FLORASTOR) 250 MG capsule, Take 1 capsule (250 mg total) by mouth 2 (two) times daily., Disp: 180 capsule, Rfl: 1 .  sitaGLIPtin (JANUVIA) 100 MG tablet, Take 1 tablet (100 mg total) by mouth daily., Disp: 90 tablet, Rfl: 2 .  triamcinolone cream (KENALOG) 0.1 %, Apply 1 application topically 2 (two) times daily., Disp: 30 g, Rfl: 3  Review of Systems:  Constitutional: Denies fever, chills, diaphoresis, appetite change and fatigue.  HEENT: Denies photophobia, eye pain, redness, hearing loss, ear pain, congestion, sore throat, rhinorrhea, sneezing, mouth sores, trouble swallowing, neck pain, neck stiffness and tinnitus.   Respiratory: Denies SOB, DOE, chest tightness,  and wheezing.   Cardiovascular: Denies chest pain, palpitations and leg swelling.  Gastrointestinal: Denies nausea, vomiting, abdominal pain,  constipation, blood in stool and abdominal distention.  Genitourinary: Denies dysuria, urgency, frequency, hematuria, flank pain and difficulty urinating.  Endocrine: Denies: hot or cold intolerance, sweats, changes in hair or nails, polyuria, polydipsia. Musculoskeletal: Denies myalgias, back pain, joint swelling, arthralgias and gait problem.  Skin: Denies pallor, rash and wound.  Neurological: Denies dizziness, seizures, syncope, weakness, light-headedness, numbness and headaches.  Hematological: Denies adenopathy. Easy bruising, personal or family bleeding history  Psychiatric/Behavioral: Denies suicidal ideation, mood changes, confusion, nervousness, sleep disturbance and agitation   Assessment and Plan:  Cough  -Likely due to allergies and post-nasal drip, less likely URI. -Don't believe she needs to be  tested for Verdi for tessalon perles per request. -RTC if no improvement in 7-10 days.    I discussed the assessment and treatment plan with the patient. The patient was provided an opportunity to ask questions and all were answered. The patient agreed with the plan and demonstrated an understanding of the instructions.   The patient was advised to call back or seek an in-person evaluation if the symptoms worsen or if the condition fails to improve as anticipated.  I provided 12 minutes of non-face-to-face time during this encounter.   Lelon Frohlich, MD [facility]

## 2019-03-10 DIAGNOSIS — J301 Allergic rhinitis due to pollen: Secondary | ICD-10-CM | POA: Diagnosis not present

## 2019-03-10 DIAGNOSIS — J3089 Other allergic rhinitis: Secondary | ICD-10-CM | POA: Diagnosis not present

## 2019-03-16 ENCOUNTER — Telehealth: Payer: Self-pay | Admitting: *Deleted

## 2019-03-16 NOTE — Telephone Encounter (Signed)
Copied from [place] [id number]. Topic: General - Inquiry >> Mar 16, 2019 11:54 AM Doris Jackson wrote: Patient inquired if it's time for her pneumococcal vaccine.

## 2019-03-16 NOTE — Telephone Encounter (Signed)
Patient needs a pneumococcal 23 vaccine. Left detailed message on machine for patient to call and schedule.

## 2019-03-18 ENCOUNTER — Telehealth: Payer: Self-pay | Admitting: Internal Medicine

## 2019-03-18 ENCOUNTER — Other Ambulatory Visit: Payer: Self-pay | Admitting: *Deleted

## 2019-03-18 MED ORDER — METOPROLOL SUCCINATE ER 50 MG PO TB24: ORAL_TABLET | ORAL | 1 refills | Status: DC

## 2019-03-18 MED ORDER — HYDROCHLOROTHIAZIDE 25 MG PO TABS: 25.0000 mg | ORAL_TABLET | Freq: Every day | ORAL | 1 refills | Status: DC

## 2019-03-18 MED ORDER — GLIPIZIDE ER 5 MG PO TB24: 5.0000 mg | ORAL_TABLET | Freq: Every day | ORAL | 1 refills | Status: DC

## 2019-03-18 NOTE — Telephone Encounter (Signed)
Medication: hydrochlorothiazide (HYDRODIURIL) 25 MG tablet [id number] , glipiZIDE (GLUCOTROL XL) 5 MG 24 hr tablet [id number] day supply Has the patient contacted their pharmacy? Yes  (Agent: If no, request that the patient contact the pharmacy for the refill.) (Agent: If yes, when and what did the pharmacy advise?)  Preferred Pharmacy (with phone number or street name): [id number] [address], [place] [phone] (Phone) [phone] (Fax    Agent: Please be advised that RX refills may take up to 3 business days. We ask that you follow-up with your pharmacy.

## 2019-03-23 ENCOUNTER — Other Ambulatory Visit: Payer: Self-pay

## 2019-03-23 ENCOUNTER — Ambulatory Visit (INDEPENDENT_AMBULATORY_CARE_PROVIDER_SITE_OTHER): Payer: Medicare HMO | Admitting: *Deleted

## 2019-03-23 DIAGNOSIS — Z23 Encounter for immunization: Secondary | ICD-10-CM | POA: Diagnosis not present

## 2019-03-30 DIAGNOSIS — J3089 Other allergic rhinitis: Secondary | ICD-10-CM | POA: Diagnosis not present

## 2019-03-30 DIAGNOSIS — J301 Allergic rhinitis due to pollen: Secondary | ICD-10-CM | POA: Diagnosis not present

## 2019-04-06 DIAGNOSIS — R69 Illness, unspecified: Secondary | ICD-10-CM | POA: Diagnosis not present

## 2019-04-11 ENCOUNTER — Telehealth: Payer: Self-pay | Admitting: Internal Medicine

## 2019-04-11 DIAGNOSIS — R05 Cough: Secondary | ICD-10-CM

## 2019-04-11 DIAGNOSIS — R059 Cough, unspecified: Secondary | ICD-10-CM

## 2019-04-12 NOTE — Telephone Encounter (Signed)
Please advise. Should the patient continue this medication?  Order pended.

## 2019-04-15 ENCOUNTER — Other Ambulatory Visit: Payer: Self-pay | Admitting: Internal Medicine

## 2019-04-15 DIAGNOSIS — R059 Cough, unspecified: Secondary | ICD-10-CM

## 2019-04-15 DIAGNOSIS — R05 Cough: Secondary | ICD-10-CM

## 2019-04-15 NOTE — Telephone Encounter (Signed)
rx denied.  Patient needs office visit

## 2019-04-15 NOTE — Telephone Encounter (Signed)
Pt said she is still having the cough

## 2019-04-15 NOTE — Telephone Encounter (Signed)
Patient is checking request status if pcp is going to send medication.  Patient states she still has cough   [id number] [address], [place] [phone] (Phone) [phone] (Fax)

## 2019-04-18 DIAGNOSIS — J301 Allergic rhinitis due to pollen: Secondary | ICD-10-CM | POA: Diagnosis not present

## 2019-04-18 DIAGNOSIS — J3089 Other allergic rhinitis: Secondary | ICD-10-CM | POA: Diagnosis not present

## 2019-04-19 ENCOUNTER — Other Ambulatory Visit: Payer: Self-pay

## 2019-04-19 ENCOUNTER — Other Ambulatory Visit: Payer: Self-pay | Admitting: Allergy and Immunology

## 2019-04-19 ENCOUNTER — Ambulatory Visit
Admission: RE | Admit: 2019-04-19 | Discharge: 2019-04-19 | Disposition: A | Discharge status: Home / Self Care | Payer: Medicare HMO | Source: Ambulatory Visit | Attending: Allergy and Immunology | Admitting: Allergy and Immunology

## 2019-04-19 DIAGNOSIS — K219 Gastro-esophageal reflux disease without esophagitis: Secondary | ICD-10-CM | POA: Diagnosis not present

## 2019-04-19 DIAGNOSIS — J3089 Other allergic rhinitis: Secondary | ICD-10-CM

## 2019-04-19 DIAGNOSIS — R05 Cough: Secondary | ICD-10-CM | POA: Diagnosis not present

## 2019-04-19 DIAGNOSIS — T781XXD Other adverse food reactions, not elsewhere classified, subsequent encounter: Secondary | ICD-10-CM | POA: Diagnosis not present

## 2019-04-19 DIAGNOSIS — J309 Allergic rhinitis, unspecified: Secondary | ICD-10-CM | POA: Diagnosis not present

## 2019-04-25 ENCOUNTER — Other Ambulatory Visit: Payer: Self-pay

## 2019-04-25 ENCOUNTER — Ambulatory Visit (INDEPENDENT_AMBULATORY_CARE_PROVIDER_SITE_OTHER): Payer: Medicare HMO | Admitting: *Deleted

## 2019-04-25 DIAGNOSIS — Z111 Encounter for screening for respiratory tuberculosis: Secondary | ICD-10-CM

## 2019-04-25 NOTE — Progress Notes (Signed)
Per orders of Dr. Sarajane Jews, injection of PPD given by Westley Hummer. Patient tolerated injection well.

## 2019-04-28 ENCOUNTER — Ambulatory Visit (INDEPENDENT_AMBULATORY_CARE_PROVIDER_SITE_OTHER): Payer: Medicare HMO | Admitting: Internal Medicine

## 2019-04-28 ENCOUNTER — Other Ambulatory Visit: Payer: Self-pay

## 2019-04-28 ENCOUNTER — Encounter: Payer: Self-pay | Admitting: Internal Medicine

## 2019-04-28 ENCOUNTER — Ambulatory Visit: Payer: Medicare HMO

## 2019-04-28 VITALS — BP 128/84 | HR 85 | Temp 98.2°F | Wt 232.4 lb

## 2019-04-28 DIAGNOSIS — E785 Hyperlipidemia, unspecified: Secondary | ICD-10-CM

## 2019-04-28 DIAGNOSIS — I10 Essential (primary) hypertension: Secondary | ICD-10-CM | POA: Diagnosis not present

## 2019-04-28 DIAGNOSIS — Z23 Encounter for immunization: Secondary | ICD-10-CM

## 2019-04-28 DIAGNOSIS — E119 Type 2 diabetes mellitus without complications: Secondary | ICD-10-CM

## 2019-04-28 LAB — TB SKIN TEST
Induration: 0 mm
TB Skin Test: NEGATIVE

## 2019-04-28 LAB — POCT GLYCOSYLATED HEMOGLOBIN (HGB A1C): Hemoglobin A1C: 6.8 % — AB (ref 4.0–5.6)

## 2019-04-28 NOTE — Progress Notes (Signed)
Established Patient Office Visit     CC/Reason for Visit: Follow-up chronic medical conditions  HPI: Doris Jackson is a 78 y.o. female who is coming in today for the above mentioned reasons. Past Medical History is significant for: Morbid obesity, remote history of breast cancer, type 2 diabetes, hyperlipidemia and hypertension all of which have been well controlled.  She is requesting a flu vaccine today.  At last visit she elected to decrease her Metformin from 1500 to 1000 mg daily due to persistent diarrhea and stomach upset.  She was started on famotidine by her allergist due to postnasal drip.  She has no acute complaints today.   Past Medical/Surgical History: Past Medical History:  Diagnosis Date  . ALLERGIC RHINITIS 06/17/2010  . ANEMIA-NOS 06/17/2010  . BREAST CANCER, HX OF 06/17/2010  . Cancer [facility]) 2008   right breast  . Cataract   . COPD 06/17/2010  . DIABETES MELLITUS, TYPE II 06/17/2010  . Endometriosis   . Fibroid    FIBROID  . HYPERLIPIDEMIA 06/17/2010  . HYPERTENSION 06/17/2010  . Leg swelling   . LOW BACK PAIN 06/17/2010  . OSTEOARTHRITIS 06/17/2010  . OSTEOPENIA 06/17/2010  . Weakness     Past Surgical History:  Procedure Laterality Date  . ABDOMINAL HYSTERECTOMY  1988   TAH,LSO  . BREAST LUMPECTOMY  2007   LUMPECTOMY FOLLOWED BY RADIATION  . OOPHORECTOMY  1988   TAH,LSO  . TUBAL LIGATION      Social History:  reports that she has never smoked. She has never used smokeless tobacco. She reports that she does not drink alcohol or use drugs.  Allergies: No Known Allergies  Family History:  Family History  Problem Relation Age of Onset  . Diabetes Mother   . Hypertension Sister   . Asthma Father   . Diabetes Brother      Current Outpatient Medications:  .  famotidine (PEPCID) 20 MG tablet, Take 20 mg by mouth 2 (two) times daily., Disp: , Rfl:  .  Probiotic Product (PROBIOTIC-10 PO), Take by mouth., Disp: , Rfl:  .  triamcinolone  (NASACORT ALLERGY 24HR) 55 MCG/ACT AERO nasal inhaler, Place 2 sprays into the nose daily., Disp: , Rfl:  .  amLODipine-benazepril (LOTREL) 5-40 MG capsule, TAKE 1 CAPSULE BY MOUTH EVERY DAY, Disp: 90 capsule, Rfl: 4 .  Ascorbic Acid (VITAMIN C) 1000 MG tablet, Take 1,000 mg by mouth 2 (two) times daily., Disp: , Rfl:  .  Azelastine-Fluticasone 137-50 MCG/ACT SUSP, Place 1 spray into both nostrils daily., Disp: 1 Bottle, Rfl: 2 .  benzonatate (TESSALON) 200 MG capsule, Take 1 capsule (200 mg total) by mouth 2 (two) times daily as needed for cough., Disp: 20 capsule, Rfl: 0 .  Cholecalciferol (VITAMIN D) 2000 UNITS CAPS, Take 1 capsule by mouth daily. Taking 2 caps daily 4,000, Disp: , Rfl:  .  cyanocobalamin 2000 MCG tablet, Take 2,000 mcg by mouth daily.  , Disp: , Rfl:  .  cyclobenzaprine (FLEXERIL) 5 MG tablet, Take 1 tablet (5 mg total) by mouth at bedtime., Disp: 60 tablet, Rfl: 3 .  fexofenadine (ALLEGRA) 180 MG tablet, Take 180 mg by mouth daily as needed. , Disp: , Rfl:  .  furosemide (LASIX) 20 MG tablet, Take 1 tablet (20 mg total) by mouth daily as needed., Disp: 90 tablet, Rfl: 4 .  glipiZIDE (GLUCOTROL XL) 5 MG 24 hr tablet, Take 1 tablet (5 mg total) by mouth daily., Disp: 90 tablet, Rfl: 1 .  glucose blood (ONETOUCH VERIO) test strip, TEST TWICE A DAY.  Dx E11.9, Disp: 200 each, Rfl: 4 .  hydrochlorothiazide (HYDRODIURIL) 25 MG tablet, Take 1 tablet (25 mg total) by mouth daily., Disp: 90 tablet, Rfl: 1 .  HYDROcodone-acetaminophen (NORCO/VICODIN) 5-325 MG tablet, Take 1 tablet by mouth 2 (two) times daily as needed for moderate pain., Disp: 60 tablet, Rfl: 0 .  HYDROcodone-acetaminophen (NORCO/VICODIN) 5-325 MG tablet, Take 1 tablet by mouth 2 (two) times daily as needed., Disp: 60 tablet, Rfl: 0 .  HYDROcodone-acetaminophen (NORCO/VICODIN) 5-325 MG tablet, Take 1 tablet by mouth 2 (two) times daily as needed for moderate pain., Disp: 60 tablet, Rfl: 0 .  iron polysaccharides (FERREX  150) 150 MG capsule, Take 1 capsule (150 mg total) by mouth daily., Disp: 90 capsule, Rfl: 1 .  Lancets 30G MISC, USE TWICE DAILY AS DIRECTED FOR GLUCOSE TESTING AND MONITORING, Disp: 100 each, Rfl: 4 .  Magnesium 250 MG TABS, Take 1 tablet by mouth as needed. , Disp: , Rfl:  .  metFORMIN (GLUCOPHAGE) 1000 MG tablet, Take 0.5 tablets (500 mg total) by mouth 2 (two) times daily with a meal., Disp: 180 tablet, Rfl: 5 .  metoprolol succinate (TOPROL-XL) 50 MG 24 hr tablet, TAKE 1 TABLET BY MOUTH EVERY MORNING. TAKE WITH OR IMMEDIATELY FOLLOWING A MEAL., Disp: 90 tablet, Rfl: 1 .  Multiple Vitamin (MULTIVITAMIN) tablet, Take 1 tablet by mouth daily.  , Disp: , Rfl:  .  ONE TOUCH LANCETS MISC, Test twice daily., Disp: 200 each, Rfl: 5 .  pioglitazone (ACTOS) 45 MG tablet, TAKE 1 TABLET BY MOUTH EVERY DAY, Disp: 90 tablet, Rfl: 1 .  pravastatin (PRAVACHOL) 40 MG tablet, Take 1 tablet (40 mg total) by mouth daily., Disp: 90 tablet, Rfl: 4 .  saccharomyces boulardii (FLORASTOR) 250 MG capsule, Take 1 capsule (250 mg total) by mouth 2 (two) times daily., Disp: 180 capsule, Rfl: 1 .  sitaGLIPtin (JANUVIA) 100 MG tablet, Take 1 tablet (100 mg total) by mouth daily., Disp: 90 tablet, Rfl: 2 .  triamcinolone cream (KENALOG) 0.1 %, Apply 1 application topically 2 (two) times daily., Disp: 30 g, Rfl: 3  Review of Systems:  Constitutional: Denies fever, chills, diaphoresis, appetite change and fatigue.  HEENT: Denies photophobia, eye pain, redness, hearing loss, ear pain, congestion, sore throat, rhinorrhea, sneezing, mouth sores, trouble swallowing, neck pain, neck stiffness and tinnitus.   Respiratory: Denies SOB, DOE, cough, chest tightness,  and wheezing.   Cardiovascular: Denies chest pain, palpitations and leg swelling.  Gastrointestinal: Denies nausea, vomiting, abdominal pain, diarrhea, constipation, blood in stool and abdominal distention.  Genitourinary: Denies dysuria, urgency, frequency, hematuria,  flank pain and difficulty urinating.  Endocrine: Denies: hot or cold intolerance, sweats, changes in hair or nails, polyuria, polydipsia. Musculoskeletal: Denies myalgias, back pain, joint swelling, arthralgias and gait problem.  Skin: Denies pallor, rash and wound.  Neurological: Denies dizziness, seizures, syncope, weakness, light-headedness, numbness and headaches.  Hematological: Denies adenopathy. Easy bruising, personal or family bleeding history  Psychiatric/Behavioral: Denies suicidal ideation, mood changes, confusion, nervousness, sleep disturbance and agitation    Physical Exam: Vitals:   04/28/19 1022  BP: 128/84  Pulse: 85  Temp: 98.2 F (36.8 C)  TempSrc: Temporal  SpO2: 98%  Weight: 232 lb 6.4 oz (105.4 kg)    Body mass index is 42.51 kg/m.   Constitutional: NAD, calm, comfortable, obese Eyes: PERRL, lids and conjunctivae normal, wears corrective lenses ENMT: Mucous membranes are moist. Respiratory: clear to auscultation bilaterally, no  wheezing, no crackles. Normal respiratory effort. No accessory muscle use.  Cardiovascular: Regular rate and rhythm, no murmurs / rubs / gallops. No extremity edema. 2+ pedal pulses.  Abdomen: no tenderness, no masses palpated. No hepatosplenomegaly. Bowel sounds positive.  Musculoskeletal: no clubbing / cyanosis. No joint deformity upper and lower extremities. Good ROM, no contractures. Normal muscle tone.  Skin: no rashes, lesions, ulcers. No induration Neurologic: Grossly intact and nonfocal Psychiatric: Normal judgment and insight. Alert and oriented x 3. Normal mood.    Impression and Plan:  Diabetes mellitus without complication (HCC) -123456 is 6.8 today.  As long as it remains below 7, I am okay with her dose reduction of Metformin.  Obesity, morbid, BMI 40.0-49.9 ([place]) -Discussed healthy lifestyle, including increased physical activity and better food choices to promote weight loss.  Dyslipidemia -LDL was 91 in  January 2019 not at goal. -Check lipids when she returns for her physical in 3 months.  Essential hypertension -Well-controlled on current regimen.   Patient Instructions  -Nice seeing you today!!  -Flu vaccine today.  -Schedule your annual physical in 3 months. Come in fasting that day.     Lelon Frohlich, MD [facility]

## 2019-04-28 NOTE — Patient Instructions (Signed)
-  Nice seeing you today!!  -Flu vaccine today.  -Schedule your annual physical in 3 months. Come in fasting that day.

## 2019-04-28 NOTE — Addendum Note (Signed)
Addended by: Westley Hummer B on: 04/28/2019 11:18 AM   Modules accepted: Orders

## 2019-05-10 ENCOUNTER — Other Ambulatory Visit: Payer: Self-pay | Admitting: Internal Medicine

## 2019-05-10 ENCOUNTER — Telehealth: Payer: Self-pay | Admitting: Internal Medicine

## 2019-05-10 DIAGNOSIS — R05 Cough: Secondary | ICD-10-CM

## 2019-05-10 DIAGNOSIS — R059 Cough, unspecified: Secondary | ICD-10-CM

## 2019-05-10 DIAGNOSIS — J309 Allergic rhinitis, unspecified: Secondary | ICD-10-CM

## 2019-05-10 NOTE — Telephone Encounter (Signed)
Referral placed.

## 2019-05-10 NOTE — Telephone Encounter (Signed)
°  Please see message below  I called the pt to inform that the Dr has to place the referral; and once it's entered. I would send the referral to [facility] for [facility], and that office will contact her directly to schedule. I forward the message to Dr. Jerilee Hoh to place the referral     Copied from [place] [id number]. Topic: General - Other >> May 09, 2019  2:05 PM Carolyn Stare wrote: Req referral to [facility] [address] [facility] for her allergies and coughing spells    [phone]

## 2019-05-11 DIAGNOSIS — J3089 Other allergic rhinitis: Secondary | ICD-10-CM | POA: Diagnosis not present

## 2019-05-11 DIAGNOSIS — J301 Allergic rhinitis due to pollen: Secondary | ICD-10-CM | POA: Diagnosis not present

## 2019-05-18 NOTE — Telephone Encounter (Signed)
Referral placed.

## 2019-05-18 NOTE — Telephone Encounter (Signed)
Pt called back requesting her referral be resent: Fax: [phone]  [facility] - [facility]   Requesting FU phone call when this is sent and ready for scheduling   CRM to referrals created as well

## 2019-05-24 ENCOUNTER — Telehealth: Payer: Self-pay | Admitting: *Deleted

## 2019-05-24 NOTE — Telephone Encounter (Signed)
Copied from [place] [phone]. Topic: General - Other >> May 24, 2019 11:44 AM Celene Kras wrote: Reason for CRM: Kennyth Lose, from [facility], called stating they did not received referral and is requesting to have it sent again. Please advise.  CC:[phone]

## 2019-05-27 ENCOUNTER — Telehealth: Payer: Self-pay | Admitting: Internal Medicine

## 2019-05-27 NOTE — Telephone Encounter (Signed)
Pt also needs refill for amLODipine-benazepril (LOTREL) 5-40 MG capsule And has no pills left/ please advise

## 2019-05-29 ENCOUNTER — Other Ambulatory Visit: Payer: Self-pay | Admitting: Internal Medicine

## 2019-05-30 ENCOUNTER — Other Ambulatory Visit: Payer: Self-pay | Admitting: *Deleted

## 2019-05-30 DIAGNOSIS — I10 Essential (primary) hypertension: Secondary | ICD-10-CM

## 2019-05-30 MED ORDER — AMLODIPINE BESY-BENAZEPRIL HCL 5-40 MG PO CAPS: ORAL_CAPSULE | ORAL | 1 refills | Status: DC

## 2019-05-30 NOTE — Telephone Encounter (Signed)
Patient checking on the status of medication refill request amLODipine-benazepril (LOTREL) 5-40 MG capsule. Informed patient practice was closed on Thursday and Friday, patient would like rx filled today, please advise   [id number] [place], [place]

## 2019-05-30 NOTE — Telephone Encounter (Signed)
Patient is calling again to check the status of her BP medication, Amlodipine.  Please advise and call in script as soon as possible.

## 2019-05-30 NOTE — Telephone Encounter (Signed)
Rx refilled and sent to pharmacy.

## 2019-05-31 ENCOUNTER — Other Ambulatory Visit: Payer: Self-pay | Admitting: *Deleted

## 2019-05-31 DIAGNOSIS — H2513 Age-related nuclear cataract, bilateral: Secondary | ICD-10-CM | POA: Diagnosis not present

## 2019-05-31 DIAGNOSIS — I10 Essential (primary) hypertension: Secondary | ICD-10-CM

## 2019-05-31 DIAGNOSIS — H18519 Endothelial corneal dystrophy, unspecified eye: Secondary | ICD-10-CM | POA: Diagnosis not present

## 2019-05-31 DIAGNOSIS — H40033 Anatomical narrow angle, bilateral: Secondary | ICD-10-CM | POA: Diagnosis not present

## 2019-06-02 ENCOUNTER — Other Ambulatory Visit: Payer: Self-pay | Admitting: Internal Medicine

## 2019-06-02 DIAGNOSIS — J301 Allergic rhinitis due to pollen: Secondary | ICD-10-CM | POA: Diagnosis not present

## 2019-06-02 DIAGNOSIS — J3089 Other allergic rhinitis: Secondary | ICD-10-CM | POA: Diagnosis not present

## 2019-06-02 MED ORDER — METFORMIN HCL 1000 MG PO TABS: 500.0000 mg | ORAL_TABLET | Freq: Two times a day (BID) | ORAL | 0 refills | Status: DC

## 2019-06-02 MED ORDER — SITAGLIPTIN PHOSPHATE 100 MG PO TABS: 100.0000 mg | ORAL_TABLET | Freq: Every day | ORAL | 0 refills | Status: DC

## 2019-06-02 NOTE — Telephone Encounter (Signed)
Copied from [place] [phone]. Topic: Quick Communication - Rx Refill/Question >> Jun 02, 2019 12:26 PM Maelee, [place] A wrote: Medication: metFORMIN (GLUCOPHAGE) 1000 MG tablet, sitaGLIPtin (JANUVIA) 100 MG tablet   Patient would like to have Rx sent to the pharmacy today please   Has the patient contacted their pharmacy? Yes.   (Agent: If no, request that the patient contact the pharmacy for the refill.) (Agent: If yes, when and what did the pharmacy advise?)  Preferred Pharmacy (with phone number or street name): [id number] [place], [place] [phone] (Phone) [phone] (Fax)    Agent: Please be advised that RX refills may take up to 3 business days. We ask that you follow-up with your pharmacy.

## 2019-06-02 NOTE — Telephone Encounter (Signed)
Requested medication (s) are due for refill today: yes  Requested medication (s) are on the active medication list: yes  Last refill:  02/25/2013  Future visit scheduled: no  Notes to clinic: review for refill Overdue for office visit    Requested Prescriptions  Pending Prescriptions Disp Refills   sitaGLIPtin (JANUVIA) 100 MG tablet 90 tablet 2    Sig: Take 1 tablet (100 mg total) by mouth daily.     Endocrinology:  Diabetes - DPP-4 Inhibitors Failed - 06/02/2019 12:44 PM      Failed - Cr in normal range and within 360 days    Creatinine  Date Value Ref Range Status  04/20/2012 0.8 0.6 - 1.1 mg/dL Final   Creatinine, Ser  Date Value Ref Range Status  07/02/2017 1.01 0.40 - 1.20 mg/dL Final         Passed - HBA1C is between 0 and 7.9 and within 180 days    Hemoglobin A1C  Date Value Ref Range Status  04/28/2019 6.8 (A) 4.0 - 5.6 % Final   Hgb A1c MFr Bld  Date Value Ref Range Status  05/25/2018 6.5 4.6 - 6.5 % Final    Comment:    Glycemic Control Guidelines for People with Diabetes:Non Diabetic:  <6%Goal of Therapy: <7%Additional Action Suggested:  >8%          Passed - Valid encounter within last 6 months    Recent Outpatient Visits          1 month ago Diabetes mellitus without complication ([place])   [place] [facility], Rayford Halsted, MD   2 months ago Cough   [place] at [facility], Rayford Halsted, MD   5 months ago Diarrhea, unspecified type   [facility], Rayford Halsted, MD   6 months ago Chronic right-sided low back pain with right-sided sciatica   [place] at [facility], Rayford Halsted, MD   10 months ago Chronic right-sided low back pain with right-sided sciatica   [place] at [facility], Rayford Halsted, MD              metFORMIN (GLUCOPHAGE) 1000 MG tablet 180 tablet 5    Sig: Take 0.5 tablets (500 mg total) by mouth 2 (two) times  daily with a meal.     Endocrinology:  Diabetes - Biguanides Failed - 06/02/2019 12:44 PM      Failed - Cr in normal range and within 360 days    Creatinine  Date Value Ref Range Status  04/20/2012 0.8 0.6 - 1.1 mg/dL Final   Creatinine, Ser  Date Value Ref Range Status  07/02/2017 1.01 0.40 - 1.20 mg/dL Final         Failed - eGFR in normal range and within 360 days    GFR  Date Value Ref Range Status  07/02/2017 68.64 >60.00 mL/min Final         Passed - HBA1C is between 0 and 7.9 and within 180 days    Hemoglobin A1C  Date Value Ref Range Status  04/28/2019 6.8 (A) 4.0 - 5.6 % Final   Hgb A1c MFr Bld  Date Value Ref Range Status  05/25/2018 6.5 4.6 - 6.5 % Final    Comment:    Glycemic Control Guidelines for People with Diabetes:Non Diabetic:  <6%Goal of Therapy: <7%Additional Action Suggested:  >8%          Passed - Valid encounter within last 6 months  Recent Outpatient Visits          1 month ago Diabetes mellitus without complication ([place])   [place] [facility] [place], MD   2 months ago Cough   [place] [facility], Rayford Halsted, MD   5 months ago Diarrhea, unspecified type   [facility], [facility] at [facility], Rayford Halsted, MD   6 months ago Chronic right-sided low back pain with right-sided sciatica   [place] [facility], Rayford Halsted, MD   10 months ago Chronic right-sided low back pain with right-sided sciatica   [place] [facility], Rayford Halsted, MD

## 2019-06-03 NOTE — Telephone Encounter (Signed)
No idea what referral they are alluding to.Marland KitchenMarland Kitchen

## 2019-06-03 NOTE — Telephone Encounter (Signed)
Spoke with patient she has already heard from [facility] to set up this appointment with pulmonology. Nothing further needed.

## 2019-06-04 ENCOUNTER — Other Ambulatory Visit: Payer: Self-pay | Admitting: Internal Medicine

## 2019-06-06 DIAGNOSIS — J301 Allergic rhinitis due to pollen: Secondary | ICD-10-CM | POA: Insufficient documentation

## 2019-06-06 DIAGNOSIS — R053 Chronic cough: Secondary | ICD-10-CM | POA: Insufficient documentation

## 2019-06-06 DIAGNOSIS — R05 Cough: Secondary | ICD-10-CM | POA: Diagnosis not present

## 2019-06-07 ENCOUNTER — Telehealth: Payer: Self-pay | Admitting: Internal Medicine

## 2019-06-07 MED ORDER — METFORMIN HCL 1000 MG PO TABS: 500.0000 mg | ORAL_TABLET | Freq: Two times a day (BID) | ORAL | 0 refills | Status: DC

## 2019-06-07 NOTE — Telephone Encounter (Signed)
Requested medication (s) are due for refill today: yes  Requested medication (s) are on the active medication list: yes  Last refill:  06/02/2019  Future visit scheduled: no  Notes to clinic:  Pharmacy states that they didn't receive Metformin and needs it to be resend   Requested Prescriptions  Pending Prescriptions Disp Refills   metFORMIN (GLUCOPHAGE) 1000 MG tablet 90 tablet 0    Sig: Take 0.5 tablets (500 mg total) by mouth 2 (two) times daily with a meal.     Endocrinology:  Diabetes - Biguanides Failed - 06/07/2019 12:51 PM      Failed - Cr in normal range and within 360 days    Creatinine  Date Value Ref Range Status  04/20/2012 0.8 0.6 - 1.1 mg/dL Final   Creatinine, Ser  Date Value Ref Range Status  07/02/2017 1.01 0.40 - 1.20 mg/dL Final         Failed - eGFR in normal range and within 360 days    GFR  Date Value Ref Range Status  07/02/2017 68.64 >60.00 mL/min Final         Passed - HBA1C is between 0 and 7.9 and within 180 days    Hemoglobin A1C  Date Value Ref Range Status  04/28/2019 6.8 (A) 4.0 - 5.6 % Final   Hgb A1c MFr Bld  Date Value Ref Range Status  05/25/2018 6.5 4.6 - 6.5 % Final    Comment:    Glycemic Control Guidelines for People with Diabetes:Non Diabetic:  <6%Goal of Therapy: <7%Additional Action Suggested:  >8%          Passed - Valid encounter within last 6 months    Recent Outpatient Visits          1 month ago Diabetes mellitus without complication ([place])   [place] at [facility], Rayford Halsted, MD   3 months ago Cough   [place] at [facility], Rayford Halsted, MD   5 months ago Diarrhea, unspecified type   [facility], Rayford Halsted, MD   6 months ago Chronic right-sided low back pain with right-sided sciatica   [place] at [facility], Rayford Halsted, MD   10 months ago Chronic right-sided low back pain with right-sided sciatica   [place] at [facility], Rayford Halsted, MD

## 2019-06-07 NOTE — Telephone Encounter (Signed)
Medication Refill - Medication: hydrochlorothiazide (HYDRODIURIL) 25 MG tablet metFORMIN (GLUCOPHAGE) 1000 MG tablet   Has the patient contacted their pharmacy? Yes.   (Agent: If no, request that the patient contact the pharmacy for the refill.) (Agent: If yes, when and what did the pharmacy advise?)  Preferred Pharmacy (with phone number or street name):  [id number] [place], [place] [address]  Phone: [phone] Fax: [phone]     Agent: Please be advised that RX refills may take up to 3 business days. We ask that you follow-up with your pharmacy.

## 2019-06-09 ENCOUNTER — Other Ambulatory Visit: Payer: Self-pay | Admitting: *Deleted

## 2019-06-09 ENCOUNTER — Other Ambulatory Visit: Payer: Self-pay | Admitting: Internal Medicine

## 2019-06-09 MED ORDER — METFORMIN HCL 1000 MG PO TABS: 500.0000 mg | ORAL_TABLET | Freq: Two times a day (BID) | ORAL | 0 refills | Status: DC

## 2019-06-09 MED ORDER — HYDROCHLOROTHIAZIDE 25 MG PO TABS: 25.0000 mg | ORAL_TABLET | Freq: Every day | ORAL | 1 refills | Status: DC

## 2019-06-09 NOTE — Telephone Encounter (Signed)
Rx's resent to the pharmacy.

## 2019-06-09 NOTE — Telephone Encounter (Signed)
Patient informed. 

## 2019-06-09 NOTE — Telephone Encounter (Signed)
Pt calling - states that pharmacy hasn't heard about the Metformin or the Hydrochlorothiazide.  When looking at the script it says "no print". Pt is out of medication and would like a call when this has been resolved.

## 2019-06-13 DIAGNOSIS — R69 Illness, unspecified: Secondary | ICD-10-CM | POA: Diagnosis not present

## 2019-06-20 DIAGNOSIS — J3089 Other allergic rhinitis: Secondary | ICD-10-CM | POA: Diagnosis not present

## 2019-06-20 DIAGNOSIS — R05 Cough: Secondary | ICD-10-CM | POA: Diagnosis not present

## 2019-06-20 DIAGNOSIS — J301 Allergic rhinitis due to pollen: Secondary | ICD-10-CM | POA: Diagnosis not present

## 2019-07-01 HISTORY — PX: CORNEAL TRANSPLANT: SHX108

## 2019-07-10 DIAGNOSIS — Z01818 Encounter for other preprocedural examination: Secondary | ICD-10-CM | POA: Diagnosis not present

## 2019-07-11 ENCOUNTER — Telehealth: Payer: Self-pay | Admitting: *Deleted

## 2019-07-11 DIAGNOSIS — J301 Allergic rhinitis due to pollen: Secondary | ICD-10-CM | POA: Diagnosis not present

## 2019-07-11 DIAGNOSIS — J3089 Other allergic rhinitis: Secondary | ICD-10-CM | POA: Diagnosis not present

## 2019-07-11 NOTE — Telephone Encounter (Signed)
Copied from [place] [id number]. Topic: General - Other >> Jul 11, 2019 11:47 AM Greggory Keen D wrote: Reason for CRM: pt called saying she made an appt for 1/21 to get a covid vaccine and wants to know if Dr. Jerilee Hoh thinks it will be ok.  She said she takes allergy shots.  CB#  [phone]

## 2019-07-12 NOTE — Telephone Encounter (Signed)
Left message on machine for patient.  Okay per Dr Jerilee Hoh for patient to get a Covid vaccine.

## 2019-07-13 DIAGNOSIS — H2512 Age-related nuclear cataract, left eye: Secondary | ICD-10-CM | POA: Diagnosis not present

## 2019-07-13 DIAGNOSIS — Z79899 Other long term (current) drug therapy: Secondary | ICD-10-CM | POA: Diagnosis not present

## 2019-07-13 DIAGNOSIS — E785 Hyperlipidemia, unspecified: Secondary | ICD-10-CM | POA: Diagnosis not present

## 2019-07-13 DIAGNOSIS — I10 Essential (primary) hypertension: Secondary | ICD-10-CM | POA: Diagnosis not present

## 2019-07-13 DIAGNOSIS — Z853 Personal history of malignant neoplasm of breast: Secondary | ICD-10-CM | POA: Diagnosis not present

## 2019-07-13 DIAGNOSIS — Z6841 Body Mass Index (BMI) 40.0 and over, adult: Secondary | ICD-10-CM | POA: Diagnosis not present

## 2019-07-13 DIAGNOSIS — E1136 Type 2 diabetes mellitus with diabetic cataract: Secondary | ICD-10-CM | POA: Diagnosis not present

## 2019-07-13 DIAGNOSIS — H18512 Endothelial corneal dystrophy, left eye: Secondary | ICD-10-CM | POA: Diagnosis not present

## 2019-07-13 DIAGNOSIS — K219 Gastro-esophageal reflux disease without esophagitis: Secondary | ICD-10-CM | POA: Diagnosis not present

## 2019-07-24 ENCOUNTER — Ambulatory Visit: Payer: Medicare HMO

## 2019-08-03 DIAGNOSIS — J301 Allergic rhinitis due to pollen: Secondary | ICD-10-CM | POA: Diagnosis not present

## 2019-08-03 DIAGNOSIS — J3089 Other allergic rhinitis: Secondary | ICD-10-CM | POA: Diagnosis not present

## 2019-08-15 ENCOUNTER — Other Ambulatory Visit: Payer: Self-pay

## 2019-08-16 ENCOUNTER — Ambulatory Visit (INDEPENDENT_AMBULATORY_CARE_PROVIDER_SITE_OTHER): Payer: Medicare HMO | Admitting: Family Medicine

## 2019-08-16 ENCOUNTER — Encounter: Payer: Self-pay | Admitting: Family Medicine

## 2019-08-16 ENCOUNTER — Other Ambulatory Visit: Payer: Self-pay

## 2019-08-16 VITALS — BP 124/74 | HR 71 | Temp 97.0°F | Wt 224.3 lb

## 2019-08-16 DIAGNOSIS — R202 Paresthesia of skin: Secondary | ICD-10-CM

## 2019-08-16 DIAGNOSIS — E119 Type 2 diabetes mellitus without complications: Secondary | ICD-10-CM | POA: Diagnosis not present

## 2019-08-16 DIAGNOSIS — E785 Hyperlipidemia, unspecified: Secondary | ICD-10-CM | POA: Diagnosis not present

## 2019-08-16 DIAGNOSIS — I10 Essential (primary) hypertension: Secondary | ICD-10-CM | POA: Diagnosis not present

## 2019-08-16 LAB — POCT GLYCOSYLATED HEMOGLOBIN (HGB A1C): Hemoglobin A1C: 7 % — AB (ref 4.0–5.6)

## 2019-08-16 MED ORDER — METFORMIN HCL ER 500 MG PO TB24: 500.0000 mg | ORAL_TABLET | Freq: Two times a day (BID) | ORAL | 11 refills | Status: DC

## 2019-08-16 NOTE — Progress Notes (Signed)
Established Patient Office Visit  Subjective:  Patient ID: Doris Jackson, female    DOB: [birth year]  Age: 79 y.o. MRN: [id number]  CC:  Chief Complaint  Patient presents with  . Diabetes    Feeling weak on her legs  . Hypertension    HPI [name] presents for follow-up of management of her Type 2 DM medications. Reports fasting glucose usually 105; states sometimes BS is elevated after dinner at night at 200. Reports frequent loose stools associated with metformin. States she has been experiencing bilateral hands feeling "mealy" since January. She describes them as tingly and difficult to grasp things. Reports difficulty walking more than a few feet without having to stop and rest. 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 ALLERGIC RHINITIS 06/17/2010  . ANEMIA-NOS 06/17/2010  . BREAST CANCER, HX OF 06/17/2010  . Cancer [facility]) 2008   right breast  . Cataract   . COPD 06/17/2010  . DIABETES MELLITUS, TYPE II 06/17/2010  . Endometriosis   . Fibroid    FIBROID  . HYPERLIPIDEMIA 06/17/2010  . HYPERTENSION 06/17/2010  . Leg swelling   . LOW BACK PAIN 06/17/2010  . OSTEOARTHRITIS 06/17/2010  . OSTEOPENIA 06/17/2010  . Weakness     Past Surgical History:  Procedure Laterality Date  . ABDOMINAL HYSTERECTOMY  1988   TAH,LSO  . BREAST LUMPECTOMY  2007   LUMPECTOMY FOLLOWED BY RADIATION  . OOPHORECTOMY  1988   TAH,LSO  . TUBAL LIGATION      Family History  Problem Relation Age of Onset  . Diabetes Mother   . Hypertension Sister   . Asthma Father   . Diabetes Brother     Social History   Socioeconomic History  . Marital status: Married    Spouse name: Not on file  . Number of children: Not on file  . Years of education: Not on file  . Highest education level: Not on file  Occupational History  . Not on file  Tobacco Use  .  Smoking status: Never Smoker  . Smokeless tobacco: Never Used  Substance and Sexual Activity  . Alcohol use: No  . Drug use: No  . Sexual activity: Never    Birth control/protection: Post-menopausal, Surgical  Other Topics Concern  . Not on file  Social History Narrative  . Not on file   Social Determinants of Health   Financial Resource Strain:   . Difficulty of Paying Living Expenses: Not on file  Food Insecurity:   . Worried About Charity fundraiser in the Last Year: Not on file  . Ran Out of Food in the Last Year: Not on file  Transportation Needs:   . Lack of Transportation (Medical): Not on file  . Lack of Transportation (Non-Medical): Not on file  Physical Activity:   . Days of Exercise per Week: Not on file  . Minutes of Exercise per Session: Not on file  Stress:   . Feeling of Stress : Not on file  Social Connections:   . Frequency of Communication with Friends and Family: Not on file  . Frequency of Social Gatherings with Friends and Family: Not on file  . Attends Religious Services: Not on file  . Active Member of Clubs or Organizations: Not on file  . Attends Archivist Meetings: Not on file  .  Marital Status: Not on file  Intimate Partner Violence:   . Fear of Current or Ex-Partner: Not on file  . Emotionally Abused: Not on file  . Physically Abused: Not on file  . Sexually Abused: Not on file    Outpatient Medications Prior to Visit  Medication Sig Dispense Refill  . amLODipine-benazepril (LOTREL) 5-40 MG capsule TAKE 1 CAPSULE BY MOUTH EVERY DAY 90 capsule 1  . Ascorbic Acid (VITAMIN C) 1000 MG tablet Take 1,000 mg by mouth 2 (two) times daily.    . Azelastine-Fluticasone 137-50 MCG/ACT SUSP Place 1 spray into both nostrils daily. 1 Bottle 2  . Cholecalciferol (VITAMIN D) 2000 UNITS CAPS Take 1 capsule by mouth daily. Taking 2 caps daily 4,000    . cyanocobalamin 2000 MCG tablet Take 2,000 mcg by mouth daily.      . cyclobenzaprine (FLEXERIL)  5 MG tablet Take 1 tablet (5 mg total) by mouth at bedtime. 60 tablet 3  . famotidine (PEPCID) 20 MG tablet Take 20 mg by mouth 2 (two) times daily.    . fexofenadine (ALLEGRA) 180 MG tablet Take 180 mg by mouth daily as needed.     . furosemide (LASIX) 20 MG tablet Take 1 tablet (20 mg total) by mouth daily as needed. 90 tablet 4  . glipiZIDE (GLUCOTROL XL) 5 MG 24 hr tablet TAKE 1 TABLET BY MOUTH EVERY DAY 90 tablet 1  . glucose blood (ONETOUCH VERIO) test strip TEST TWICE A DAY.  Dx E11.9 200 each 4  . hydrochlorothiazide (HYDRODIURIL) 25 MG tablet Take 1 tablet (25 mg total) by mouth daily. 90 tablet 1  . HYDROcodone-acetaminophen (NORCO/VICODIN) 5-325 MG tablet Take 1 tablet by mouth 2 (two) times daily as needed for moderate pain. 60 tablet 0  . HYDROcodone-acetaminophen (NORCO/VICODIN) 5-325 MG tablet Take 1 tablet by mouth 2 (two) times daily as needed. 60 tablet 0  . HYDROcodone-acetaminophen (NORCO/VICODIN) 5-325 MG tablet Take 1 tablet by mouth 2 (two) times daily as needed for moderate pain. 60 tablet 0  . iron polysaccharides (FERREX 150) 150 MG capsule Take 1 capsule (150 mg total) by mouth daily. 90 capsule 1  . Lancets 30G MISC USE TWICE DAILY AS DIRECTED FOR GLUCOSE TESTING AND MONITORING 100 each 4  . Magnesium 250 MG TABS Take 1 tablet by mouth as needed.     . metFORMIN (GLUCOPHAGE) 1000 MG tablet Take 0.5 tablets (500 mg total) by mouth 2 (two) times daily with a meal. 90 tablet 0  . metoprolol succinate (TOPROL-XL) 50 MG 24 hr tablet TAKE 1 TABLET BY MOUTH EVERY MORNING. TAKE WITH OR IMMEDIATELY FOLLOWING A MEAL. 90 tablet 1  . Multiple Vitamin (MULTIVITAMIN) tablet Take 1 tablet by mouth daily.      Marland Kitchen omeprazole (PRILOSEC) 40 MG capsule Take 40 mg by mouth at bedtime.    Marland Kitchen ONE TOUCH LANCETS MISC Test twice daily. 200 each 5  . pioglitazone (ACTOS) 45 MG tablet TAKE 1 TABLET BY MOUTH EVERY DAY 90 tablet 0  . pravastatin (PRAVACHOL) 40 MG tablet Take 1 tablet (40 mg total) by  mouth daily. 90 tablet 4  . Probiotic Product (PROBIOTIC-10 PO) Take by mouth.    . saccharomyces boulardii (FLORASTOR) 250 MG capsule Take 1 capsule (250 mg total) by mouth 2 (two) times daily. 180 capsule 1  . sitaGLIPtin (JANUVIA) 100 MG tablet Take 1 tablet (100 mg total) by mouth daily. 90 tablet 0  . triamcinolone (NASACORT ALLERGY 24HR) 55 MCG/ACT AERO nasal inhaler  Place 2 sprays into the nose daily.    Marland Kitchen triamcinolone cream (KENALOG) 0.1 % Apply 1 application topically 2 (two) times daily. 30 g 3  . benzonatate (TESSALON) 200 MG capsule Take 1 capsule (200 mg total) by mouth 2 (two) times daily as needed for cough. (Patient not taking: Reported on 08/16/2019) 20 capsule 0   No facility-administered medications prior to visit.    No Known Allergies  ROS Review of Systems  GEN: Well nourished, well developed, in no acute distress, Alert and interactive HEENT: normocephalic, atraumatic. Nares symmetric, patent. Moist mucous membranes Neck: Supple, no JVD or masses. Normal ROM Cardiac: normal S1 S2, tachycardia. no murmurs, rubs, or gallops, no edema  Respiratory:  clear to auscultation bilaterally, normal work of breathing GI: soft, nontender, nondistended MS: Normal tone and ROM, strength and sensation intact, cap refill <2sec, 2+ distal pulses bilaterally, no deformity or atrophy Skin: Intact, warm and dry, no rashes, no lesions, no erythema Neuro:  Alert and Oriented x 3, Cranial nerves II to XII intact, Reflex symmetric, Sensation intact, follows commands, gait normal Psych: euthymic mood, full affect    Objective:    Physical Exam  BP 124/74   Pulse 71   Temp (!) 97 F (36.1 C) (Temporal)   Wt 224 lb 4.8 oz (101.7 kg)   SpO2 95%   BMI 41.03 kg/m  Wt Readings from Last 3 Encounters:  08/16/19 224 lb 4.8 oz (101.7 kg)  04/28/19 232 lb 6.4 oz (105.4 kg)  08/06/18 231 lb 14.4 oz (105.2 kg)     Health Maintenance Due  Topic Date Due  . TETANUS/TDAP  01/22/1961    . PAP SMEAR-Modifier  09/21/2013  . OPHTHALMOLOGY EXAM  06/05/2017  . FOOT EXAM  04/20/2019    There are no preventive care reminders to display for this patient.  Lab Results  Component Value Date   TSH 1.20 07/02/2017   Lab Results  Component Value Date   WBC 6.6 07/02/2017   HGB 10.5 (L) 07/02/2017   HCT 32.2 (L) 07/02/2017   MCV 80.2 07/02/2017   PLT 146.0 (L) 07/02/2017   Lab Results  Component Value Date   NA 140 07/02/2017   K 4.1 07/02/2017   CO2 30 07/02/2017   GLUCOSE 106 (H) 07/02/2017   BUN 27 (H) 07/02/2017   CREATININE 1.01 07/02/2017   BILITOT 0.4 07/02/2017   ALKPHOS 80 07/02/2017   AST 8 07/02/2017   ALT 8 07/02/2017   PROT 7.1 07/02/2017   ALBUMIN 3.9 07/02/2017   CALCIUM 9.2 07/02/2017   GFR 68.64 07/02/2017   Lab Results  Component Value Date   CHOL 160 07/02/2017   Lab Results  Component Value Date   HDL 40.40 07/02/2017   Lab Results  Component Value Date   LDLCALC 91 07/02/2017   Lab Results  Component Value Date   TRIG 143.0 07/02/2017   Lab Results  Component Value Date   CHOLHDL 4 07/02/2017   Lab Results  Component Value Date   HGBA1C 7.0 (A) 08/16/2019      Assessment & Plan:   Problem List Items Addressed This Visit      Endocrine   Diabetes mellitus without complication ([place]) - Primary   Relevant Orders   POCT glycosylated hemoglobin (Hb A1C) (Completed)     ASSESSMENT: 1. Type 2 DM - A1C is 7.0 today. She has not been taking her oral medications as described. Using Januvia as needed instead of daily for elevated BS  because she thought it was causing hypoglycemia in the past. She is taking metformin 1000 mg at night and takes Actos 45 mg and glipizide 5 mg every morning. States she continues to have frequent loose stools.   2. Bilateral hand paresthesia - grips strong bilaterally. No swelling or deformity noted. Skin is cool and dry. No rash or erythema noted. Discussed hx of low B12 that required IM B12 for a  period of time and now oral supplementation.    PLAN: 1. Type 2 DM: change metformin to ER formulation to help decrease loose stools. Advised her to take metformin ER 500 mg twice daily.  Advised her to stop glipizide and continue Actos. Advised her to take Januvia daily.   2. Parasthesia: check serum B12 level  3. Hyperlipidemia: needs updated lab work for management of HTN, hyperlipidemia, and DM. Patient will return tomorrow for fasting lab work.   4. Follow up in 3 months  No orders of the defined types were placed in this encounter.   Follow-up: No follow-ups on file.    Emmaline Life, RN, BSN AGPCNP, [facility]  Agree with above.   She has frequent loose stools which are likely related to Metformin.   Inconsistent use of other diabetes medications.  Changing to Metformin ER to see if that corrects loose stools. Get back on regular use of Januvia.   Future labs with B12, lipid, hepatic, and BMP.    Eulas Post MD [facility]

## 2019-08-16 NOTE — Patient Instructions (Addendum)
STOP Glipizide 5 mg CHANGE your Metformin to the extended release tablets that we sent to your pharmacy TAKE Januvia 100 mg every day  Get your fasting blood work here tomorrow  Follow up in 3 months  Diabetes Mellitus and Exercise Exercising regularly is important for your overall health, especially when you have diabetes (diabetes mellitus). Exercising is not only about losing weight. It has many other health benefits, such as increasing muscle strength and bone density and reducing body fat and stress. This leads to improved fitness, flexibility, and endurance, all of which result in better overall health. Exercise has additional benefits for people with diabetes, including:  Reducing appetite.  Helping to lower and control blood glucose.  Lowering blood pressure.  Helping to control amounts of fatty substances (lipids) in the blood, such as cholesterol and triglycerides.  Helping the body to respond better to insulin (improving insulin sensitivity).  Reducing how much insulin the body needs.  Decreasing the risk for heart disease by: ? Lowering cholesterol and triglyceride levels. ? Increasing the levels of good cholesterol. ? Lowering blood glucose levels. What is my activity plan? Your health care provider or certified diabetes educator can help you make a plan for the type and frequency of exercise (activity plan) that works for you. Make sure that you:  Do at least 150 minutes of moderate-intensity or vigorous-intensity exercise each week. This could be brisk walking, biking, or water aerobics. ? Do stretching and strength exercises, such as yoga or weightlifting, at least 2 times a week. ? Spread out your activity over at least 3 days of the week.  Get some form of physical activity every day. ? Do not go more than 2 days in a row without some kind of physical activity. ? Avoid being inactive for more than 30 minutes at a time. Take frequent breaks to walk or  stretch.  Choose a type of exercise or activity that you enjoy, and set realistic goals.  Start slowly, and gradually increase the intensity of your exercise over time. What do I need to know about managing my diabetes?   Check your blood glucose before and after exercising. ? If your blood glucose is 240 mg/dL (13.3 mmol/L) or higher before you exercise, check your urine for ketones. If you have ketones in your urine, do not exercise until your blood glucose returns to normal. ? If your blood glucose is 100 mg/dL (5.6 mmol/L) or lower, eat a snack containing 15-20 grams of carbohydrate. Check your blood glucose 15 minutes after the snack to make sure that your level is above 100 mg/dL (5.6 mmol/L) before you start your exercise.  Know the symptoms of low blood glucose (hypoglycemia) and how to treat it. Your risk for hypoglycemia increases during and after exercise. Common symptoms of hypoglycemia can include: ? Hunger. ? Anxiety. ? Sweating and feeling clammy. ? Confusion. ? Dizziness or feeling light-headed. ? Increased heart rate or palpitations. ? Blurry vision. ? Tingling or numbness around the mouth, lips, or tongue. ? Tremors or shakes. ? Irritability.  Keep a rapid-acting carbohydrate snack available before, during, and after exercise to help prevent or treat hypoglycemia.  Avoid injecting insulin into areas of the body that are going to be exercised. For example, avoid injecting insulin into: ? The arms, when playing tennis. ? The legs, when jogging.  Keep records of your exercise habits. Doing this can help you and your health care provider adjust your diabetes management plan as needed. Write down: ?  Food that you eat before and after you exercise. ? Blood glucose levels before and after you exercise. ? The type and amount of exercise you have done. ? When your insulin is expected to peak, if you use insulin. Avoid exercising at times when your insulin is  peaking.  When you start a new exercise or activity, work with your health care provider to make sure the activity is safe for you, and to adjust your insulin, medicines, or food intake as needed.  Drink plenty of water while you exercise to prevent dehydration or heat stroke. Drink enough fluid to keep your urine clear or pale yellow. Summary  Exercising regularly is important for your overall health, especially when you have diabetes (diabetes mellitus).  Exercising has many health benefits, such as increasing muscle strength and bone density and reducing body fat and stress.  Your health care provider or certified diabetes educator can help you make a plan for the type and frequency of exercise (activity plan) that works for you.  When you start a new exercise or activity, work with your health care provider to make sure the activity is safe for you, and to adjust your insulin, medicines, or food intake as needed. This information is not intended to replace advice given to you by your health care provider. Make sure you discuss any questions you have with your health care provider. Document Revised: 01/08/2017 Document Reviewed: 11/26/2015 Elsevier Patient Education  [employer].

## 2019-08-17 ENCOUNTER — Other Ambulatory Visit (INDEPENDENT_AMBULATORY_CARE_PROVIDER_SITE_OTHER): Payer: Medicare HMO

## 2019-08-17 DIAGNOSIS — R202 Paresthesia of skin: Secondary | ICD-10-CM | POA: Diagnosis not present

## 2019-08-17 DIAGNOSIS — I10 Essential (primary) hypertension: Secondary | ICD-10-CM

## 2019-08-17 DIAGNOSIS — E785 Hyperlipidemia, unspecified: Secondary | ICD-10-CM

## 2019-08-17 LAB — LIPID PANEL
Cholesterol: 189 mg/dL (ref 0–200)
HDL: 36.3 mg/dL — ABNORMAL LOW (ref >39.00)
LDL Cholesterol: 122 mg/dL — ABNORMAL HIGH (ref 0–99)
NonHDL: 153.18
Total CHOL/HDL Ratio: 5
Triglycerides: 157 mg/dL — ABNORMAL HIGH (ref 0.0–149.0)
VLDL: 31.4 mg/dL (ref 0.0–40.0)

## 2019-08-17 LAB — HEPATIC FUNCTION PANEL
ALT: 8 U/L (ref 0–35)
AST: 8 U/L (ref 0–37)
Albumin: 3.9 g/dL (ref 3.5–5.2)
Alkaline Phosphatase: 85 U/L (ref 39–117)
Bilirubin, Direct: 0.1 mg/dL (ref 0.0–0.3)
Total Bilirubin: 0.5 mg/dL (ref 0.2–1.2)
Total Protein: 7.7 g/dL (ref 6.0–8.3)

## 2019-08-17 LAB — BASIC METABOLIC PANEL
BUN: 34 mg/dL — ABNORMAL HIGH (ref 6–23)
CO2: 29 mEq/L (ref 19–32)
Calcium: 9.5 mg/dL (ref 8.4–10.5)
Chloride: 103 mEq/L (ref 96–112)
Creatinine, Ser: 1.27 mg/dL — ABNORMAL HIGH (ref 0.40–1.20)
GFR: 49.3 mL/min — ABNORMAL LOW (ref >60.00)
Glucose, Bld: 123 mg/dL — ABNORMAL HIGH (ref 70–99)
Potassium: 3.8 mEq/L (ref 3.5–5.1)
Sodium: 140 mEq/L (ref 135–145)

## 2019-08-17 LAB — VITAMIN B12: Vitamin B-12: 1470 pg/mL — ABNORMAL HIGH (ref 211–911)

## 2019-08-18 ENCOUNTER — Other Ambulatory Visit: Payer: Medicare HMO

## 2019-08-19 ENCOUNTER — Telehealth: Payer: Self-pay | Admitting: Internal Medicine

## 2019-08-19 NOTE — Telephone Encounter (Signed)
Pt was in last week for labs and would like to know her results. Thanks

## 2019-08-19 NOTE — Telephone Encounter (Signed)
Patient is aware of lab results.

## 2019-08-22 DIAGNOSIS — R69 Illness, unspecified: Secondary | ICD-10-CM | POA: Diagnosis not present

## 2019-08-23 ENCOUNTER — Telehealth: Payer: Self-pay | Admitting: *Deleted

## 2019-08-23 ENCOUNTER — Other Ambulatory Visit: Payer: Self-pay | Admitting: Internal Medicine

## 2019-08-23 ENCOUNTER — Telehealth: Payer: Self-pay | Admitting: Internal Medicine

## 2019-08-23 DIAGNOSIS — R29898 Other symptoms and signs involving the musculoskeletal system: Secondary | ICD-10-CM

## 2019-08-23 DIAGNOSIS — J3089 Other allergic rhinitis: Secondary | ICD-10-CM | POA: Diagnosis not present

## 2019-08-23 DIAGNOSIS — J301 Allergic rhinitis due to pollen: Secondary | ICD-10-CM | POA: Diagnosis not present

## 2019-08-23 DIAGNOSIS — R269 Unspecified abnormalities of gait and mobility: Secondary | ICD-10-CM

## 2019-08-23 NOTE — Telephone Encounter (Signed)
Okay 

## 2019-08-23 NOTE — Telephone Encounter (Signed)
Ok with me 

## 2019-08-23 NOTE — Telephone Encounter (Signed)
Patient's husband is a patient of Dr Ethlyn Gallery and would like to transfer from Dr Jerilee Hoh to Dr Ethlyn Gallery.  Okay to schedule?

## 2019-08-23 NOTE — Telephone Encounter (Signed)
Returned patient's call:  1.  She is still having problems grasping with her hand and would like to know if she should go to ortho?   2. Patient states that since stopping glipizide her glucose fasting readings in the mornings are 150-200.  Please advise  3. Patient request a referral to PT due to weakness in her legs and difficult gait after surgery.  Referral placed per Dr Jerilee Hoh.

## 2019-08-23 NOTE — Telephone Encounter (Signed)
FYI patient has schedule an appointment with Dr Elease Hashimoto 08/23/19

## 2019-08-23 NOTE — Telephone Encounter (Signed)
Pt is having issues using her left hand and would like to speak to you because you are familiar with her hand issue. Thanks

## 2019-08-24 ENCOUNTER — Encounter: Payer: Self-pay | Admitting: Family Medicine

## 2019-08-24 ENCOUNTER — Ambulatory Visit (INDEPENDENT_AMBULATORY_CARE_PROVIDER_SITE_OTHER): Payer: Medicare HMO

## 2019-08-24 ENCOUNTER — Other Ambulatory Visit: Payer: Self-pay

## 2019-08-24 ENCOUNTER — Ambulatory Visit (INDEPENDENT_AMBULATORY_CARE_PROVIDER_SITE_OTHER): Payer: Medicare HMO | Admitting: Family Medicine

## 2019-08-24 ENCOUNTER — Ambulatory Visit: Payer: Medicare HMO | Attending: Internal Medicine | Admitting: Physical Therapy

## 2019-08-24 VITALS — BP 110/80 | HR 83 | Temp 97.3°F | Wt 223.8 lb

## 2019-08-24 DIAGNOSIS — M5032 Other cervical disc degeneration, mid-cervical region, unspecified level: Secondary | ICD-10-CM | POA: Diagnosis not present

## 2019-08-24 DIAGNOSIS — R5383 Other fatigue: Secondary | ICD-10-CM | POA: Diagnosis not present

## 2019-08-24 DIAGNOSIS — D649 Anemia, unspecified: Secondary | ICD-10-CM

## 2019-08-24 DIAGNOSIS — M542 Cervicalgia: Secondary | ICD-10-CM

## 2019-08-24 DIAGNOSIS — R202 Paresthesia of skin: Secondary | ICD-10-CM

## 2019-08-24 DIAGNOSIS — M545 Low back pain, unspecified: Secondary | ICD-10-CM

## 2019-08-24 DIAGNOSIS — M48061 Spinal stenosis, lumbar region without neurogenic claudication: Secondary | ICD-10-CM

## 2019-08-24 DIAGNOSIS — M6281 Muscle weakness (generalized): Secondary | ICD-10-CM | POA: Diagnosis not present

## 2019-08-24 DIAGNOSIS — M48062 Spinal stenosis, lumbar region with neurogenic claudication: Secondary | ICD-10-CM | POA: Insufficient documentation

## 2019-08-24 DIAGNOSIS — Z1211 Encounter for screening for malignant neoplasm of colon: Secondary | ICD-10-CM

## 2019-08-24 DIAGNOSIS — G8929 Other chronic pain: Secondary | ICD-10-CM | POA: Diagnosis not present

## 2019-08-24 DIAGNOSIS — R262 Difficulty in walking, not elsewhere classified: Secondary | ICD-10-CM

## 2019-08-24 LAB — IBC + FERRITIN
Ferritin: 96.4 ng/mL (ref 10.0–291.0)
Iron: 46 ug/dL (ref 42–145)
Saturation Ratios: 14.1 % — ABNORMAL LOW (ref 20.0–50.0)
Transferrin: 233 mg/dL (ref 212.0–360.0)

## 2019-08-24 LAB — POCT HEMOGLOBIN: Hemoglobin: 11 g/dL (ref 11–14.6)

## 2019-08-24 NOTE — Patient Instructions (Signed)
Access Code: [email or web address]  URL: [email or web address]  Date: 08/24/2019  Prepared by: Jari Favre   Exercises Supine Single Knee to Chest Stretch - 5 reps - 1 sets - 10 sec hold                            - 2x daily - 7x weekly Seated March - 10 reps - 3 sets - 1x daily - 7x weekly Seated Long Arc Quad - 10 reps - 3 sets - 1x daily - 7x weekly Seated Hip Adduction Isometrics with Ball - 10 reps - 3 sets - 1x daily - 7x weekly Sit to Stand with Counter Support - 5 reps - 1 sets - 3x daily - 7x weekly

## 2019-08-24 NOTE — Telephone Encounter (Signed)
Appointment scheduled.

## 2019-08-24 NOTE — Progress Notes (Signed)
Subjective:     Patient ID: Doris Jackson, female   DOB: [birth year], 79 y.o.   MRN: [id number]  HPI Doris Jackson is seen today for multiple issues as follows  She has concern about worsening anemia.  She has had some chronic anemia which looks to be normocytic in the past.  Recent B12 level normal.  No recent iron studies.  Her hemoglobin seems to range between 10.5 and 11.  She has had some nonspecific fatigue and that was her concerning getting her hemoglobin rechecked.  She has not had any recent colonoscopy.  No bloody stools or melena.   Type 2 diabetes.  She had some recent issues with loose stools and we switch her to extended release Metformin.  She has not seen much improvement yet.  She had not been taking her Januvia consistently.  We had her get back on Januvia and had discontinued her glipizide.  Since stopping glipizide her blood sugars have gone back up with fasting sugars around 175.  Her recent A1c was 7.0% back in February.  She has chronic low back pain and has seen specialists including orthopedics in the past.  Previous MRI scan lumbar spine 2019 showed severe multilevel spondylosis and spinal stenosis.  She has had injections in the past without improvement.  She declined surgery.  She is very limited because of her chronic pain.  She also describing some upper extremity paresthesias and some increasing neck pain.  No recent injury.  She is currently getting some physical therapy.  Past Medical History:  Diagnosis Date  . ALLERGIC RHINITIS 06/17/2010  . ANEMIA-NOS 06/17/2010  . BREAST CANCER, HX OF 06/17/2010  . Cancer [facility]) 2008   right breast  . Cataract   . COPD 06/17/2010  . DIABETES MELLITUS, TYPE II 06/17/2010  . Endometriosis   . Fibroid    FIBROID  . HYPERLIPIDEMIA 06/17/2010  . HYPERTENSION 06/17/2010  . Leg swelling   . LOW BACK PAIN 06/17/2010  . OSTEOARTHRITIS 06/17/2010  . OSTEOPENIA 06/17/2010  . Weakness    Past Surgical History:  Procedure  Laterality Date  . ABDOMINAL HYSTERECTOMY  1988   TAH,LSO  . BREAST LUMPECTOMY  2007   LUMPECTOMY FOLLOWED BY RADIATION  . OOPHORECTOMY  1988   TAH,LSO  . TUBAL LIGATION      reports that she has never smoked. She has never used smokeless tobacco. She reports that she does not drink alcohol or use drugs. family history includes Asthma in her father; Diabetes in her brother and mother; Hypertension in her sister. No Known Allergies   Review of Systems  Constitutional: Positive for fatigue. Negative for chills and fever.  Respiratory: Negative for cough and shortness of breath.   Cardiovascular: Negative for chest pain and leg swelling.  Gastrointestinal: Negative for abdominal pain.  Genitourinary: Negative for dysuria.  Musculoskeletal: Positive for back pain, neck pain and neck stiffness.  Skin: Negative for rash.  Neurological: Negative for dizziness.       Objective:   Physical Exam Constitutional:      Appearance: She is well-developed.  Eyes:     Pupils: Pupils are equal, round, and reactive to light.  Neck:     Thyroid: No thyromegaly.     Vascular: No JVD.  Cardiovascular:     Rate and Rhythm: Normal rate and regular rhythm.     Heart sounds: No gallop.   Pulmonary:     Effort: Pulmonary effort is normal. No respiratory distress.  Breath sounds: Normal breath sounds. No wheezing or rales.  Musculoskeletal:     Cervical back: Neck supple.     Right lower leg: No edema.     Left lower leg: No edema.  Neurological:     General: No focal deficit present.     Mental Status: She is alert.     Cranial Nerves: No cranial nerve deficit.     Comments: No focal strength deficits upper extremities.        Assessment:     #1 chronic anemia.  This is technically normocytic though her MCV has been borderline microcytic.  Suspect probably chronic anemia related to anemia of chronic disease.  Not clear if she has ever had studies to rule out things like thalassemia  minor.  No recent iron studies.  #2 type 2 diabetes.  Worsening control after recent discontinuation of glipizide.  #3 chronic low back pain secondary to lumbar stenosis  #4 progressive cervical neck pain with bilateral upper extremity paresthesias    Plan:     -Start back Glucotrol 5 mg once daily  -We discussed getting her back into see orthopedic specialist but at this point she is somewhat reluctant.  We will start with some cervical spine x-rays.  May need MRI cervical spine to further clarify her bilateral upper extremity paresthesias and progressive neck pain  -Check iron studies with ferritin, TIBC, serum iron   -Patient overdue for repeat colonoscopy and this will be set up  Eulas Post MD [facility]

## 2019-08-24 NOTE — Therapy (Signed)
[facility] [address] [place], [place], [address] Phone: [phone]   Fax:  [phone]  Physical Therapy Evaluation  Patient Details  Name: Doris Jackson MRN: [id number] Date of Birth: 07/17/1940 Referring Provider (PT): Isaac Bliss, Rayford Halsted, MD   Encounter Date: 08/24/2019  PT End of Session - 08/24/19 1347    Visit Number  1    Date for PT Re-Evaluation  10/19/19    PT Start Time  1310    PT Stop Time  1339    PT Time Calculation (min)  29 min    Activity Tolerance  Patient limited by fatigue    Behavior During Therapy  [facility] for tasks assessed/performed       Past Medical History:  Diagnosis Date  . ALLERGIC RHINITIS 06/17/2010  . ANEMIA-NOS 06/17/2010  . BREAST CANCER, HX OF 06/17/2010  . Cancer [facility]) 2008   right breast  . Cataract   . COPD 06/17/2010  . DIABETES MELLITUS, TYPE II 06/17/2010  . Endometriosis   . Fibroid    FIBROID  . HYPERLIPIDEMIA 06/17/2010  . HYPERTENSION 06/17/2010  . Leg swelling   . LOW BACK PAIN 06/17/2010  . OSTEOARTHRITIS 06/17/2010  . OSTEOPENIA 06/17/2010  . Weakness     Past Surgical History:  Procedure Laterality Date  . ABDOMINAL HYSTERECTOMY  1988   TAH,LSO  . BREAST LUMPECTOMY  2007   LUMPECTOMY FOLLOWED BY RADIATION  . OOPHORECTOMY  1988   TAH,LSO  . TUBAL LIGATION      There were no vitals filed for this visit.   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  I am tired now after going to the doctors.  It made me tired just standing to get x-ray.  My back starts to hurt also when standing and walking for a while; I have to lean forward    Pertinent History  diabetic; chronic back pain    How long can you stand comfortably?  I can't even wash up without sitting down - I get tired in 5 minutes    Patient Stated Goals  stand and walk more    Currently in  Pain?  No/denies         [facility] PT Assessment - 08/24/19 0001      Assessment   Medical Diagnosis  R29.898 (ICD-10-CM) - Weakness of lower extremity, unspecified laterality; R26.9 (ICD-10-CM) - Gait difficulty    Referring Provider (PT)  Isaac Bliss, Rayford Halsted, MD    Onset Date/Surgical Date  --   worse in January after cornea surgery- inactive for 2-3 week   Prior Therapy  No      Precautions   Precautions  None      Restrictions   Weight Bearing Restrictions  No      Balance Screen   Has the patient fallen in the past 6 months  No      [place]    Living Arrangements  Spouse/significant other      Prior Function   Level of [place]  Retired      Associate Professor   Overall Cognitive Status  Within Functional Limits for tasks assessed      Posture/Postural Control   Posture/Postural Control  Postural limitations    Postural Limitations  Rounded Shoulders;Flexed trunk  ROM / Strength   AROM / PROM / Strength  PROM;Strength      PROM   Overall PROM Comments  hip flexion, ER, IR about 25% limited      Strength   Overall Strength Comments  bilateral hip and knee strength 4-/5      Flexibility   Soft Tissue Assessment /Muscle Length  yes    Hamstrings  60%      Transfers   Five time sit to stand comments   29 sec needs UE      Ambulation/Gait   Ambulation/Gait  Yes    Ambulation/Gait Assistance  6: Modified independent (Device/Increase time)    Ambulation Distance (Feet)  40 Feet   1/2 lap around the gym   Assistive device  Rolling walker    Gait Pattern  Decreased stride length;Trunk flexed;Wide base of support      6 minute walk test results    Endurance additional comments  only able to complete 40 sec before sitting; started feeling dizzy                Objective measurements completed on examination: See above findings.      [place] Adult PT Treatment/Exercise -  08/24/19 0001      Self-Care   Self-Care  Other Self-Care Comments    Other Self-Care Comments   educated and performed initial HEP             PT Education - 08/24/19 1346    Education Details  Access Code: [email or web address]    Person(s) Educated  Patient    Methods  Explanation;Demonstration;Handout;Verbal cues    Comprehension  Verbalized understanding;Returned demonstration       PT Short Term Goals - 08/24/19 1355      PT SHORT TERM GOAL #1   Title  able to do 5xsit to stand without UE for at least 3/5    Time  4    Period  Weeks    Status  New    Target Date  09/21/19      PT SHORT TERM GOAL #2   Title  able to perform 3 min walk test    Time  4    Period  Weeks    Status  New    Target Date  09/21/19        PT Long Term Goals - 08/24/19 1316      PT LONG TERM GOAL #1   Title  Pt can stand without leaning for at least  10-15 minutes to fix a meal    Time  8    Period  Weeks    Status  New    Target Date  10/19/19      PT LONG TERM GOAL #2   Title  Pt will be ind with advanced HEP to continue to maintain fitness goals    Time  8    Status  New    Target Date  10/19/19      PT LONG TERM GOAL #3   Title  Pt will demonstrate 5x sit to stand <19 sec to demonstrate improved strength and reduced risk of falls    Baseline  29 sec    Time  8    Period  Weeks    Status  New    Target Date  10/19/19      PT LONG TERM GOAL #4   Title  Pt able to perform 6 min walk test without stopping  Time  8    Period  Weeks    Status  New    Target Date  10/19/19      PT LONG TERM GOAL #5   Title  Pt able to perform bathing and dressing activities without sitting due to increased strength and endurance    Time  8    Period  Weeks    Status  New    Target Date  10/19/19             Plan - 08/24/19 1347    Clinical Impression Statement  Pt presents to skilled PT due to deconditioning and difficulty standing and walking to complete functional task.  She is at  increased risk for falls based on 5x sit to stand and endurance is very low only able to perform 40 sec of walking test.  Pt has LE weakness and decreased ROM.  Pt has posture abnormalities as mentioned above.  Berg test was not performed due to fatigue with other activities and assessments.  Pt will benefit from skilled PT to address impairments and restore functional independence.    Personal Factors and Comorbidities  Age;Fitness;Comorbidity 2    Comorbidities  chronic back pain; recently had limited activity    Examination-Activity Limitations  Bathing;Locomotion Level;Transfers;Dressing;Stand    Examination-Participation Restrictions  Meal Prep;Community Activity    Stability/Clinical Decision Making  Evolving/Moderate complexity    Clinical Decision Making  Moderate    Rehab Potential  Excellent    PT Frequency  2x / week    PT Duration  8 weeks    PT Treatment/Interventions  ADLs/Self Care Home Management;Aquatic Therapy;Biofeedback;Cryotherapy;Electrical Stimulation;Iontophoresis 4mg /ml Dexamethasone;Moist Heat;Ultrasound;Neuromuscular re-education;Balance training;Therapeutic activities;Functional mobility training;Therapeutic exercise;Stair training;Gait training;Patient/family education;Manual techniques;Dry needling;Passive range of motion;Energy conservation    PT Next Visit Plan  f/u on HEP; nustep; progress strength and endurance as tolerated; monitor fatigue    PT Home Exercise Plan  Access Code: [email or web address]    Consulted and Agree with Plan of Care  Patient       Patient will benefit from skilled therapeutic intervention in order to improve the following deficits and impairments:  Abnormal gait, Decreased range of motion, Difficulty walking, Obesity, Increased muscle spasms, Pain, Decreased activity tolerance, Decreased balance, Postural dysfunction, Decreased strength  Visit Diagnosis: Difficulty in walking, not elsewhere classified  Chronic low back pain, unspecified back pain  laterality, unspecified whether sciatica present  Muscle weakness (generalized)     Problem List Patient Active Problem List   Diagnosis Date Noted  . Lumbar stenosis 08/24/2019  . Obesity, morbid, BMI 40.0-49.9 ([place]) 04/19/2018  . Cataract   . Fibroid   . Endometriosis   . Diabetes mellitus without complication ([place]) [id number]  . Dyslipidemia 06/17/2010  . ANEMIA-NOS 06/17/2010  . Essential hypertension 06/17/2010  . Allergic rhinitis 06/17/2010  . Osteoarthritis 06/17/2010  . LOW BACK PAIN 06/17/2010  . OSTEOPENIA 06/17/2010  . BREAST CANCER, HX OF 06/17/2010    Jule Ser, PT 08/24/2019, 1:59 PM  [facility] [address], [place], [place], [address] Phone: [phone]   Fax:  [phone]  Name: Doris Jackson MRN: [id number] Date of Birth: Jun 29, 1941

## 2019-08-25 ENCOUNTER — Encounter: Payer: Self-pay | Admitting: Physical Therapy

## 2019-08-25 ENCOUNTER — Ambulatory Visit: Payer: Medicare HMO | Admitting: Physical Therapy

## 2019-08-25 DIAGNOSIS — R262 Difficulty in walking, not elsewhere classified: Secondary | ICD-10-CM | POA: Diagnosis not present

## 2019-08-25 DIAGNOSIS — M545 Low back pain, unspecified: Secondary | ICD-10-CM

## 2019-08-25 DIAGNOSIS — M6281 Muscle weakness (generalized): Secondary | ICD-10-CM | POA: Diagnosis not present

## 2019-08-25 DIAGNOSIS — G8929 Other chronic pain: Secondary | ICD-10-CM

## 2019-08-25 NOTE — Therapy (Signed)
[facility] [address] [place], [place], [address] Phone: [phone]   Fax:  [phone]  Physical Therapy Treatment  Patient Details  Name: Doris Jackson MRN: [id number] Date of Birth: 1940/09/03 Referring Provider (PT): Isaac Bliss, Rayford Halsted, MD   Encounter Date: 08/25/2019  PT End of Session - 08/25/19 1359    Visit Number  2    Date for PT Re-Evaluation  10/19/19    Authorization Type  [employer] Medicare    PT Start Time  1400    PT Stop Time  1430    PT Time Calculation (min)  30 min    Activity Tolerance  Patient limited by fatigue    Behavior During Therapy  [facility] for tasks assessed/performed       Past Medical History:  Diagnosis Date  . ALLERGIC RHINITIS 06/17/2010  . ANEMIA-NOS 06/17/2010  . BREAST CANCER, HX OF 06/17/2010  . Cancer [facility]) 2008   right breast  . Cataract   . COPD 06/17/2010  . DIABETES MELLITUS, TYPE II 06/17/2010  . Endometriosis   . Fibroid    FIBROID  . HYPERLIPIDEMIA 06/17/2010  . HYPERTENSION 06/17/2010  . Leg swelling   . LOW BACK PAIN 06/17/2010  . OSTEOARTHRITIS 06/17/2010  . OSTEOPENIA 06/17/2010  . Weakness     Past Surgical History:  Procedure Laterality Date  . ABDOMINAL HYSTERECTOMY  1988   TAH,LSO  . BREAST LUMPECTOMY  2007   LUMPECTOMY FOLLOWED BY RADIATION  . OOPHORECTOMY  1988   TAH,LSO  . TUBAL LIGATION      There were no vitals filed for this visit.  Subjective Assessment - 08/25/19 1359    Subjective  I did the seated marching this morning.  I was able to fix myself a bowl of cereal today.    Pertinent History  diabetic; chronic back pain    How long can you stand comfortably?  I can't even wash up without sitting down - I get tired in 5 minutes    Patient Stated Goals  stand and walk more    Currently in Pain?  No/denies                       [facility] Adult PT Treatment/Exercise - 08/25/19 0001      Ambulation/Gait   Ambulation/Gait Assistance  6: Modified independent (Device/Increase time)    Ambulation Distance (Feet)  40 Feet    Assistive device  Rolling walker    Gait Comments  Pt reported feeling shakey and nervous.  Had done seated ther ex and sit to stands prior to gait      Exercises   Exercises  Lumbar;Knee/Hip;Shoulder;Ankle      Lumbar Exercises: Aerobic   Nustep  L1 seat 8 arms 10, 5 min, PT present to monitor for fatigue, Pt needed a break at 3 min      Knee/Hip Exercises: Seated   Long Arc Quad  Strengthening;Both;2 sets;5 reps    Long Arc Quad Limitations  PT cued control on eccentric phase    Marching  AROM;Strengthening;20 reps    Abd/Adduction Limitations  ball squeeze 1x5, 5 sec holds    Sit to [employer]  5 reps      Shoulder Exercises: Seated   Other Seated Exercises  posterior shoulder rolls x 10    Other Seated Exercises  scap squeezes x 10      Ankle Exercises: Seated   Other Seated Ankle Exercises  heel toe raises x 20               PT Short Term Goals - 08/24/19 1355      PT SHORT TERM GOAL #1   Title  able to do 5xsit to stand without UE for at least 3/5    Time  4    Period  Weeks    Status  New    Target Date  09/21/19      PT SHORT TERM GOAL #2   Title  able to perform 3 min walk test    Time  4    Period  Weeks    Status  New    Target Date  09/21/19        PT Long Term Goals - 08/24/19 1316      PT LONG TERM GOAL #1   Title  Pt can stand without leaning for at least  10-15 minutes to fix a meal    Time  8    Period  Weeks    Status  New    Target Date  10/19/19      PT LONG TERM GOAL #2   Title  Pt will be ind with advanced HEP to continue to maintain fitness goals    Time  8    Status  New    Target Date  10/19/19      PT LONG TERM GOAL #3   Title  Pt will demonstrate 5x sit to stand <19 sec to demonstrate improved strength and reduced risk of falls    Baseline  29 sec    Time  8    Period  Weeks    Status  New    Target Date   10/19/19      PT LONG TERM GOAL #4   Title  Pt able to perform 6 min walk test without stopping    Time  8    Period  Weeks    Status  New    Target Date  10/19/19      PT LONG TERM GOAL #5   Title  Pt able to perform bathing and dressing activities without sitting due to increased strength and endurance    Time  8    Period  Weeks    Status  New    Target Date  10/19/19            Plan - 08/25/19 1430    Clinical Impression Statement  Pt with first follow up visit after evaluation yesterday.  She was able to perform 5 min on NuStep with a need for a short break at 3 min (approx 30 sec).  PT reviewed seated HEP and trialed seated heel/toe raises, shoulder rolls and scap squeezes in tall sitting.  Pt expressed worry about aggravating her shoulder arthritis with shoulder exercises so kept reps short.  Pt is able to do 5 sit to stands with a standing pause and good eccentric control with use of bil armrests.  PT reinforced safety with rollator to not pull on it and to ensure it is locked before she stands up toward it.  Pt ambulated 40' end of session with rollator and expressed worry over shakey legs.  PT discussed possible need to put gait at front of appointment from time to time to work on gait endurance.  PT walked Pt out to car and helped with rollator transfer into car.  Short session due to fatigue.    PT  Frequency  2x / week    PT Duration  8 weeks    PT Treatment/Interventions  ADLs/Self Care Home Management;Aquatic Therapy;Biofeedback;Cryotherapy;Electrical Stimulation;Iontophoresis 4mg /ml Dexamethasone;Moist Heat;Ultrasound;Neuromuscular re-education;Balance training;Therapeutic activities;Functional mobility training;Therapeutic exercise;Stair training;Gait training;Patient/family education;Manual techniques;Dry needling;Passive range of motion;Energy conservation    PT Next Visit Plan  gait endurance first (40' intervals with rollator and short break - see if Pt can do 2x),  review seated HEP, NuStep if energy left, progress as able, frequent rest breaks as needed.    PT Home Exercise Plan  Access Code: [email or web address]    Consulted and Agree with Plan of Care  Patient       Patient will benefit from skilled therapeutic intervention in order to improve the following deficits and impairments:     Visit Diagnosis: Difficulty in walking, not elsewhere classified  Chronic low back pain, unspecified back pain laterality, unspecified whether sciatica present  Muscle weakness (generalized)     Problem List Patient Active Problem List   Diagnosis Date Noted  . Lumbar stenosis 08/24/2019  . Obesity, morbid, BMI 40.0-49.9 ([place]) 04/19/2018  . Cataract   . Fibroid   . Endometriosis   . Diabetes mellitus without complication ([place]) [id number]  . Dyslipidemia 06/17/2010  . ANEMIA-NOS 06/17/2010  . Essential hypertension 06/17/2010  . Allergic rhinitis 06/17/2010  . Osteoarthritis 06/17/2010  . LOW BACK PAIN 06/17/2010  . OSTEOPENIA 06/17/2010  . BREAST CANCER, HX OF 06/17/2010    Baruch Merl, PT 08/25/19 2:37 PM   [facility] [address] [place], [place], [address] Phone: [phone]   Fax:  [phone]  Name: Doris Jackson MRN: [id number] Date of Birth: 12/23/1940

## 2019-08-26 ENCOUNTER — Ambulatory Visit: Payer: Medicare HMO | Admitting: Family Medicine

## 2019-08-26 NOTE — Addendum Note (Signed)
Addended by: Gwenyth Ober R on: 08/26/2019 08:20 AM   Modules accepted: Orders

## 2019-08-29 ENCOUNTER — Ambulatory Visit: Payer: Medicare HMO | Attending: Internal Medicine | Admitting: Physical Therapy

## 2019-08-29 ENCOUNTER — Encounter: Payer: Self-pay | Admitting: Physical Therapy

## 2019-08-29 ENCOUNTER — Other Ambulatory Visit: Payer: Self-pay

## 2019-08-29 DIAGNOSIS — G8929 Other chronic pain: Secondary | ICD-10-CM | POA: Diagnosis not present

## 2019-08-29 DIAGNOSIS — R262 Difficulty in walking, not elsewhere classified: Secondary | ICD-10-CM | POA: Diagnosis not present

## 2019-08-29 DIAGNOSIS — M545 Low back pain, unspecified: Secondary | ICD-10-CM

## 2019-08-29 DIAGNOSIS — M6281 Muscle weakness (generalized): Secondary | ICD-10-CM

## 2019-08-29 NOTE — Therapy (Signed)
[facility] [address], [place], [place], [address] Phone: [phone]   Fax:  [phone]  Physical Therapy Treatment  Patient Details  Name: Doris Jackson MRN: [id number] Date of Birth: [birth year] Referring Provider (PT): Isaac Bliss, Rayford Halsted, MD   Encounter Date: 08/29/2019  PT End of Session - 08/29/19 1058    Visit Number  3    Date for PT Re-Evaluation  10/19/19    Authorization Type  Aetna Medicare    PT Start Time  1017    PT Stop Time  1100    PT Time Calculation (min)  43 min    Activity Tolerance  Patient tolerated treatment well    Behavior During Therapy  [facility] for tasks assessed/performed       Past Medical History:  Diagnosis Date  . ALLERGIC RHINITIS 06/17/2010  . ANEMIA-NOS 06/17/2010  . BREAST CANCER, HX OF 06/17/2010  . Cancer [facility]) 2008   right breast  . Cataract   . COPD 06/17/2010  . DIABETES MELLITUS, TYPE II 06/17/2010  . Endometriosis   . Fibroid    FIBROID  . HYPERLIPIDEMIA 06/17/2010  . HYPERTENSION 06/17/2010  . Leg swelling   . LOW BACK PAIN 06/17/2010  . OSTEOARTHRITIS 06/17/2010  . OSTEOPENIA 06/17/2010  . Weakness     Past Surgical History:  Procedure Laterality Date  . ABDOMINAL HYSTERECTOMY  1988   TAH,LSO  . BREAST LUMPECTOMY  2007   LUMPECTOMY FOLLOWED BY RADIATION  . OOPHORECTOMY  1988   TAH,LSO  . TUBAL LIGATION      There were no vitals filed for this visit.  Subjective Assessment - 08/29/19 1059    Subjective  I did good after last visit. I am feeling a little tired today.    Pertinent History  diabetic; chronic back pain    Currently in Pain?  No/denies    Multiple Pain Sites  No                       [facility] Adult PT Treatment/Exercise - 08/29/19 0001      Lumbar Exercises: Aerobic   Nustep  L1 6:30 min with PTA present to discuss status      Knee/Hip Exercises: Standing   Hip Flexion  --   30 sec standing at TM bar   Other Standing Knee Exercises  Step touch 4x Bil      Knee/Hip Exercises: Seated   Long Arc Quad  Strengthening;Both;1 set;10 reps    Long Arc Quad Weight  1 lbs.    Marching  AROM;Strengthening;Both;1 set;10 reps;Weights    Marching Weights  1 lbs.    Abd/Adduction Limitations  ball squeeze 1x10, 5 sec holds    Sit to [employer]  --   2x5     Shoulder Exercises: Seated   Horizontal ABduction  --   yellow band 5x , VC for technique   Other Seated Exercises  scap squeezes x 10   TC at rhomboids, VC to relax neck              PT Short Term Goals - 08/24/19 1355      PT SHORT TERM GOAL #1   Title  able to do 5xsit to stand without UE for at least 3/5    Time  4    Period  Weeks    Status  New    Target Date  09/21/19      PT SHORT  TERM GOAL #2   Title  able to perform 3 min walk test    Time  4    Period  Weeks    Status  New    Target Date  09/21/19        PT Long Term Goals - 08/24/19 1316      PT LONG TERM GOAL #1   Title  Pt can stand without leaning for at least  10-15 minutes to fix a meal    Time  8    Period  Weeks    Status  New    Target Date  10/19/19      PT LONG TERM GOAL #2   Title  Pt will be ind with advanced HEP to continue to maintain fitness goals    Time  8    Status  New    Target Date  10/19/19      PT LONG TERM GOAL #3   Title  Pt will demonstrate 5x sit to stand <19 sec to demonstrate improved strength and reduced risk of falls    Baseline  29 sec    Time  8    Period  Weeks    Status  New    Target Date  10/19/19      PT LONG TERM GOAL #4   Title  Pt able to perform 6 min walk test without stopping    Time  8    Period  Weeks    Status  New    Target Date  10/19/19      PT LONG TERM GOAL #5   Title  Pt able to perform bathing and dressing activities without sitting due to increased strength and endurance    Time  8    Period  Weeks    Status  New    Target Date  10/19/19            Plan - 08/29/19 1150     Clinical Impression Statement  Even though pt continues to tolerate low level exercises, she was able to double some of her repetitions and even use 1# weight with some of her seated exercises. This was pretty fatiguing but pt was able to complete what was asked of her. Pt additionally tolerated 2 standing exercises with support of the treadmill bar.    Personal Factors and Comorbidities  Age;Fitness;Comorbidity 2    Comorbidities  chronic back pain; recently had limited activity    Examination-Activity Limitations  Bathing;Locomotion Level;Transfers;Dressing;Stand    Examination-Participation Restrictions  Meal Prep;Community Activity    Stability/Clinical Decision Making  Evolving/Moderate complexity    Rehab Potential  Excellent    PT Frequency  2x / week    PT Duration  8 weeks    PT Treatment/Interventions  ADLs/Self Care Home Management;Aquatic Therapy;Biofeedback;Cryotherapy;Electrical Stimulation;Iontophoresis 4mg /ml Dexamethasone;Moist Heat;Ultrasound;Neuromuscular re-education;Balance training;Therapeutic activities;Functional mobility training;Therapeutic exercise;Stair training;Gait training;Patient/family education;Manual techniques;Dry needling;Passive range of motion;Energy conservation    PT Next Visit Plan  gait endurance first (40' intervals with rollator and short break - see if Pt can do 2x), review seated HEP, NuStep if energy left, progress as able, frequent rest breaks as needed.    PT Home Exercise Plan  Access Code: [email or web address]       Patient will benefit from skilled therapeutic intervention in order to improve the following deficits and impairments:  Abnormal gait, Decreased range of motion, Difficulty walking, Obesity, Increased muscle spasms, Pain, Decreased activity tolerance, Decreased balance, Postural dysfunction, Decreased strength  Visit  Diagnosis: Difficulty in walking, not elsewhere classified  Chronic low back pain, unspecified back pain laterality, unspecified  whether sciatica present  Muscle weakness (generalized)     Problem List Patient Active Problem List   Diagnosis Date Noted  . Lumbar stenosis 08/24/2019  . Obesity, morbid, BMI 40.0-49.9 ([place]) 04/19/2018  . Cataract   . Fibroid   . Endometriosis   . Diabetes mellitus without complication ([place]) [id number]  . Dyslipidemia 06/17/2010  . ANEMIA-NOS 06/17/2010  . Essential hypertension 06/17/2010  . Allergic rhinitis 06/17/2010  . Osteoarthritis 06/17/2010  . LOW BACK PAIN 06/17/2010  . OSTEOPENIA 06/17/2010  . BREAST CANCER, HX OF 06/17/2010    Juancarlos Crescenzo, PTA 08/29/2019, 11:52 AM   [facility] [address] [place], [place], [address] Phone: [phone]   Fax:  [phone]  Name: SHAUNICE FOSNIGHT MRN: [id number] Date of Birth: Nov 07, 1940

## 2019-09-02 DIAGNOSIS — M542 Cervicalgia: Secondary | ICD-10-CM | POA: Diagnosis not present

## 2019-09-04 DIAGNOSIS — Z9842 Cataract extraction status, left eye: Secondary | ICD-10-CM | POA: Insufficient documentation

## 2019-09-04 DIAGNOSIS — Z961 Presence of intraocular lens: Secondary | ICD-10-CM | POA: Insufficient documentation

## 2019-09-05 ENCOUNTER — Encounter: Payer: Self-pay | Admitting: Physical Therapy

## 2019-09-05 ENCOUNTER — Ambulatory Visit: Payer: Medicare HMO | Admitting: Physical Therapy

## 2019-09-05 ENCOUNTER — Other Ambulatory Visit: Payer: Self-pay

## 2019-09-05 DIAGNOSIS — M545 Low back pain, unspecified: Secondary | ICD-10-CM

## 2019-09-05 DIAGNOSIS — M6281 Muscle weakness (generalized): Secondary | ICD-10-CM

## 2019-09-05 DIAGNOSIS — G8929 Other chronic pain: Secondary | ICD-10-CM | POA: Diagnosis not present

## 2019-09-05 DIAGNOSIS — R262 Difficulty in walking, not elsewhere classified: Secondary | ICD-10-CM | POA: Diagnosis not present

## 2019-09-05 NOTE — Therapy (Addendum)
[facility] [address] [place], [place], [address] Phone: [phone]   Fax:  [phone]  Physical Therapy Treatment  Patient Details  Name: Doris Jackson MRN: [id number] Date of Birth: [birth year] Referring Provider (PT): Isaac Bliss, Rayford Halsted, MD   Encounter Date: 09/05/2019  PT End of Session - 09/05/19 1020    Visit Number  4    Date for PT Re-Evaluation  10/19/19    Authorization Type  [employer]    PT Start Time  1020    PT Stop Time  1056    PT Time Calculation (min)  36 min    Activity Tolerance  Patient tolerated treatment well;Patient limited by fatigue    Behavior During Therapy  [facility] for tasks assessed/performed       Past Medical History:  Diagnosis Date  . ALLERGIC RHINITIS 06/17/2010  . ANEMIA-NOS 06/17/2010  . BREAST CANCER, HX OF 06/17/2010  . Cancer [facility]) 2008   right breast  . Cataract   . COPD 06/17/2010  . DIABETES MELLITUS, TYPE II 06/17/2010  . Endometriosis   . Fibroid    FIBROID  . HYPERLIPIDEMIA 06/17/2010  . HYPERTENSION 06/17/2010  . Leg swelling   . LOW BACK PAIN 06/17/2010  . OSTEOARTHRITIS 06/17/2010  . OSTEOPENIA 06/17/2010  . Weakness     Past Surgical History:  Procedure Laterality Date  . ABDOMINAL HYSTERECTOMY  1988   TAH,LSO  . BREAST LUMPECTOMY  2007   LUMPECTOMY FOLLOWED BY RADIATION  . OOPHORECTOMY  1988   TAH,LSO  . TUBAL LIGATION      There were no vitals filed for this visit.  Subjective Assessment - 09/05/19 1021    Subjective  I did well after last session, just a littel tired. Shoulders feel a little tight this AM.    Pertinent History  diabetic; chronic back pain    Currently in Pain?  No/denies    Multiple Pain Sites  No         [facility] PT Assessment - 09/05/19 0001      6 minute walk test results    Endurance additional comments  Able to complete 2 min with RW.                   [facility] PT Treatment/Exercise  - 09/05/19 0001      Lumbar Exercises: Aerobic   Nustep  L1 7 min PTA present to discuss status      Knee/Hip Exercises: Standing   Heel Raises  Both;1 set;10 reps    Other Standing Knee Exercises  Standing at walker, no UE  stood for 2 min    Other Standing Knee Exercises  Walking in clinic with RW; 2 min before needing to sit down dut to fatigue.      Knee/Hip Exercises: Seated   Long Arc Quad  Strengthening;Both;1 set;10 reps;Weights    Long Arc Quad Weight  2 lbs.    Abd/Adduction Limitations  ball squeeze 1x10, 5 sec holds    Sit to [employer]  --   2x5, pt requires very mininmal use of UE              PT Short Term Goals - 08/24/19 1355      PT SHORT TERM GOAL #1   Title  able to do 5xsit to stand without UE for at least 3/5    Time  4    Period  Weeks    Status  New    [place] Date  09/21/19      PT SHORT TERM GOAL #2   Title  able to perform 3 min walk test    Time  4    Period  Weeks    Status  New    Target Date  09/21/19        PT Long Term Goals - 08/24/19 1316      PT LONG TERM GOAL #1   Title  Pt can stand without leaning for at least  10-15 minutes to fix a meal    Time  8    Period  Weeks    Status  New    Target Date  10/19/19      PT LONG TERM GOAL #2   Title  Pt will be ind with advanced HEP to continue to maintain fitness goals    Time  8    Status  New    Target Date  10/19/19      PT LONG TERM GOAL #3   Title  Pt will demonstrate 5x sit to stand <19 sec to demonstrate improved strength and reduced risk of falls    Baseline  29 sec    Time  8    Period  Weeks    Status  New    Target Date  10/19/19      PT LONG TERM GOAL #4   Title  Pt able to perform 6 min walk test without stopping    Time  8    Period  Weeks    Status  New    Target Date  10/19/19      PT LONG TERM GOAL #5   Title  Pt able to perform bathing and dressing activities without sitting due to increased strength and endurance    Time  8    Period  Weeks     Status  New    Target Date  10/19/19            Plan - 09/05/19 1057    Clinical Impression Statement  Pt arrives today reporting a tightness in her shoulders. She is scheduled to see an ortho MD to evaluate her shoulders soon. She reports mild fatigue with last session. pt could tolerate more resistance with her LE, was able to walk 2 min ( last attempt was 40 sec) and performed most of her sit to stands with very little use of her UE.    Personal Factors and Comorbidities  Age;Fitness;Comorbidity 2    Comorbidities  chronic back pain; recently had limited activity    Examination-Activity Limitations  Bathing;Locomotion Level;Transfers;Dressing;Stand    Stability/Clinical Decision Making  Evolving/Moderate complexity    Rehab Potential  Excellent    PT Frequency  2x / week    PT Duration  8 weeks    PT Treatment/Interventions  ADLs/Self Care Home Management;Aquatic Therapy;Biofeedback;Cryotherapy;Electrical Stimulation;Iontophoresis 36m/ml Dexamethasone;Moist Heat;Ultrasound;Neuromuscular re-education;Balance training;Therapeutic activities;Functional mobility training;Therapeutic exercise;Stair training;Gait training;Patient/family education;Manual techniques;Dry needling;Passive range of motion;Energy conservation    PT Next Visit Plan  gait endurance : see if pt can replicate 2 min again., NuStep , progress as able, frequent rest breaks as needed. LE strength and endurance, increase time standing to tolerate home tasks better.    PT Home Exercise Plan  Access Code: [email or web address]    Consulted and Agree with Plan of Care  Patient       Patient will benefit from skilled therapeutic intervention in order to improve the following deficits  and impairments:  Abnormal gait, Decreased range of motion, Difficulty walking, Obesity, Increased muscle spasms, Pain, Decreased activity tolerance, Decreased balance, Postural dysfunction, Decreased strength  Visit Diagnosis: Difficulty in walking, not  elsewhere classified  Chronic low back pain, unspecified back pain laterality, unspecified whether sciatica present  Muscle weakness (generalized)     Problem List Patient Active Problem List   Diagnosis Date Noted  . Lumbar stenosis 08/24/2019  . Obesity, morbid, BMI 40.0-49.9 ([place]) 04/19/2018  . Cataract   . Fibroid   . Endometriosis   . Diabetes mellitus without complication ([place]) 36/11/7701  . Dyslipidemia 06/17/2010  . ANEMIA-NOS 06/17/2010  . Essential hypertension 06/17/2010  . Allergic rhinitis 06/17/2010  . Osteoarthritis 06/17/2010  . LOW BACK PAIN 06/17/2010  . OSTEOPENIA 06/17/2010  . BREAST CANCER, HX OF 06/17/2010    Everlena Mackley, PTA 09/05/2019, 4:09 PM  PHYSICAL THERAPY DISCHARGE SUMMARY  Visits from Start of Care: 4  Current functional level related to goals / functional outcomes: Pt never returned to clinic after 4th visit.   Remaining deficits: See above   Education / Equipment: HEP Plan: Patient agrees to discharge.  Patient goals were partially met. Patient is being discharged due to not returning since the last visit.  ?????         Baruch Merl, PT 06/12/20 12:06 PM   [facility] [address] [place], [place], [address] Phone: [phone]   Fax:  [phone]  Name: KESLYN TEATER MRN: [id number] Date of Birth: 08-21-1940

## 2019-09-07 ENCOUNTER — Other Ambulatory Visit: Payer: Self-pay

## 2019-09-07 ENCOUNTER — Ambulatory Visit: Payer: Medicare HMO | Admitting: Physical Therapy

## 2019-09-07 ENCOUNTER — Ambulatory Visit (INDEPENDENT_AMBULATORY_CARE_PROVIDER_SITE_OTHER): Payer: Medicare HMO | Admitting: Family Medicine

## 2019-09-07 ENCOUNTER — Encounter: Payer: Self-pay | Admitting: Family Medicine

## 2019-09-07 VITALS — BP 110/80 | HR 96 | Temp 97.4°F | Ht 62.0 in | Wt 220.2 lb

## 2019-09-07 DIAGNOSIS — M8949 Other hypertrophic osteoarthropathy, multiple sites: Secondary | ICD-10-CM | POA: Diagnosis not present

## 2019-09-07 DIAGNOSIS — I10 Essential (primary) hypertension: Secondary | ICD-10-CM

## 2019-09-07 DIAGNOSIS — E538 Deficiency of other specified B group vitamins: Secondary | ICD-10-CM

## 2019-09-07 DIAGNOSIS — M159 Polyosteoarthritis, unspecified: Secondary | ICD-10-CM

## 2019-09-07 DIAGNOSIS — R531 Weakness: Secondary | ICD-10-CM

## 2019-09-07 DIAGNOSIS — E785 Hyperlipidemia, unspecified: Secondary | ICD-10-CM | POA: Diagnosis not present

## 2019-09-07 DIAGNOSIS — G629 Polyneuropathy, unspecified: Secondary | ICD-10-CM

## 2019-09-07 DIAGNOSIS — J309 Allergic rhinitis, unspecified: Secondary | ICD-10-CM | POA: Diagnosis not present

## 2019-09-07 LAB — CORTISOL: Cortisol, Plasma: 13.6 ug/dL

## 2019-09-07 LAB — CK: Total CK: 104 U/L (ref 7–177)

## 2019-09-07 LAB — C-REACTIVE PROTEIN: CRP: 1.4 mg/dL (ref 0.5–20.0)

## 2019-09-07 LAB — TSH: TSH: 1.2 u[IU]/mL (ref 0.35–4.50)

## 2019-09-07 LAB — SEDIMENTATION RATE: Sed Rate: 105 mm/hr — ABNORMAL HIGH (ref 0–30)

## 2019-09-07 LAB — FOLATE: Folate: 11.9 ng/mL (ref >5.9)

## 2019-09-07 NOTE — Progress Notes (Signed)
Doris Jackson DOB: 05/15/1941 Encounter date: 09/07/2019  This isa 79 y.o. female who presents to establish care. Chief Complaint  Patient presents with  . Establish Care    History of present illness: Just feels limp today. 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 Bought walker a week ago. No energy.   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 Had appointment for MRI through orthopedic for 18th of this month. Hands seem worse in morning. Hands feel like there is grit on them. Feels that everything started after having to lay flat. Can feel pressure on shoulders; sometimes gets some tightness within shoulders. Sometimes wakes with pain in neck, but not having it regularly. Fatigues very easily with walking. Just feels like the muscles aren't working.   Feels like she will drop if she moves too much. Not having any lower back pain. Used to take hydrocodone, but really hasn't been taking this recently.  Hard just trying to get moving in the morning.   She is doing PT as well. Supposed to have it again today, but cancelled. Working on leg strength. Was walking by self prior to January surgery.  Last visit in the office was with Dr. Elease Hashimoto August 24, 2019.  At that time Glucotrol 5 mg daily started back.  Blood work was completed to follow-up on anemia.  Colonoscopy was set up.  And imaging of cervical spine initiated for evaluation of upper extremity paresthesias.  Due to multiple levels of arthritis, Dr. Elease Hashimoto had recommended she follow-up with specialist.  Not noting significant joint pain.   Occasionally some morning nausea. Not wanting to eat as much.  DM,allergies,osteoporosis: chronic condition discussion deferred due to current complaints  Past Medical History:  Diagnosis Date  . ALLERGIC RHINITIS 06/17/2010  . ANEMIA-NOS 06/17/2010  . BREAST CANCER, HX OF 06/17/2010  . Cancer [facility]) 2008   right  breast  . Cataract   . COPD 06/17/2010  . DIABETES MELLITUS, TYPE II 06/17/2010  . Endometriosis   . Fibroid    FIBROID  . HYPERLIPIDEMIA 06/17/2010  . HYPERTENSION 06/17/2010  . Leg swelling   . LOW BACK PAIN 06/17/2010  . OSTEOARTHRITIS 06/17/2010  . OSTEOPENIA 06/17/2010  . Weakness    Past Surgical History:  Procedure Laterality Date  . ABDOMINAL HYSTERECTOMY  1988   TAH,LSO  . BREAST LUMPECTOMY  2007   LUMPECTOMY FOLLOWED BY RADIATION  . OOPHORECTOMY  1988   TAH,LSO  . TUBAL LIGATION     No Known Allergies Current Meds  Medication Sig  . amLODipine-benazepril (LOTREL) 5-40 MG capsule TAKE 1 CAPSULE BY MOUTH EVERY DAY  . Ascorbic Acid (VITAMIN C) 1000 MG tablet Take 1,000 mg by mouth 2 (two) times daily.  . Azelastine-Fluticasone 137-50 MCG/ACT SUSP Place 1 spray into both nostrils daily.  . Cholecalciferol (VITAMIN D) 2000 UNITS CAPS Take 1 capsule by mouth daily. Taking 2 caps daily 4,000  . cyanocobalamin 2000 MCG tablet Take 2,000 mcg by mouth daily.    . cyclobenzaprine (FLEXERIL) 5 MG tablet Take 1 tablet (5 mg total) by mouth at bedtime.  . famotidine (PEPCID) 20 MG tablet Take 20 mg by mouth 2 (two) times daily.  . fexofenadine (ALLEGRA) 180 MG tablet Take 180 mg by mouth daily as needed.   . furosemide (LASIX) 20 MG tablet Take 1 tablet (20 mg total) by mouth daily as needed.  Marland Kitchen glipiZIDE (GLUCOTROL XL) 5 MG 24 hr tablet TAKE  1 TABLET BY MOUTH EVERY DAY  . glucose blood (ONETOUCH VERIO) test strip TEST TWICE A DAY.  Dx E11.9  . hydrochlorothiazide (HYDRODIURIL) 25 MG tablet Take 1 tablet (25 mg total) by mouth daily.  Marland Kitchen HYDROcodone-acetaminophen (NORCO/VICODIN) 5-325 MG tablet Take 1 tablet by mouth 2 (two) times daily as needed for moderate pain.  Marland Kitchen HYDROcodone-acetaminophen (NORCO/VICODIN) 5-325 MG tablet Take 1 tablet by mouth 2 (two) times daily as needed.  Marland Kitchen HYDROcodone-acetaminophen (NORCO/VICODIN) 5-325 MG tablet Take 1 tablet by mouth 2 (two) times daily  as needed for moderate pain.  . iron polysaccharides (FERREX 150) 150 MG capsule Take 1 capsule (150 mg total) by mouth daily.  . Lancets 30G MISC USE TWICE DAILY AS DIRECTED FOR GLUCOSE TESTING AND MONITORING  . Magnesium 250 MG TABS Take 1 tablet by mouth as needed.   . metFORMIN (GLUCOPHAGE XR) 500 MG 24 hr tablet Take 1 tablet (500 mg total) by mouth 2 (two) times daily.  . metoprolol succinate (TOPROL-XL) 50 MG 24 hr tablet TAKE 1 TABLET BY MOUTH EVERY MORNING. TAKE WITH OR IMMEDIATELY FOLLOWING A MEAL.  . Multiple Vitamin (MULTIVITAMIN) tablet Take 1 tablet by mouth daily.    Marland Kitchen omeprazole (PRILOSEC) 40 MG capsule Take 40 mg by mouth at bedtime.  Marland Kitchen ONE TOUCH LANCETS MISC Test twice daily.  . pioglitazone (ACTOS) 45 MG tablet TAKE 1 TABLET BY MOUTH EVERY DAY  . pravastatin (PRAVACHOL) 40 MG tablet Take 1 tablet (40 mg total) by mouth daily.  . Probiotic Product (PROBIOTIC-10 PO) Take by mouth.  . saccharomyces boulardii (FLORASTOR) 250 MG capsule Take 1 capsule (250 mg total) by mouth 2 (two) times daily.  . sitaGLIPtin (JANUVIA) 100 MG tablet Take 1 tablet (100 mg total) by mouth daily.  Marland Kitchen triamcinolone (NASACORT ALLERGY 24HR) 55 MCG/ACT AERO nasal inhaler Place 2 sprays into the nose daily.  Marland Kitchen triamcinolone cream (KENALOG) 0.1 % Apply 1 application topically 2 (two) times daily.   Social History   Tobacco Use  . Smoking status: Never Smoker  . Smokeless tobacco: Never Used  Substance Use Topics  . Alcohol use: No   Family History  Problem Relation Age of Onset  . Diabetes Mother   . Hypertension Sister   . Asthma Father   . Diabetes Brother      Review of Systems  Constitutional: Positive for appetite change and fatigue. Negative for chills and fever.  HENT: Negative for congestion, sinus pressure, sinus pain and sore throat.   Respiratory: Negative for cough, chest tightness, shortness of breath and wheezing.   Cardiovascular: Negative for chest pain, palpitations and leg  swelling.  Gastrointestinal: Negative for abdominal pain, constipation and diarrhea.  Musculoskeletal: Positive for gait problem (harder to walk, legs weaker) and myalgias (not specifically noting pain in muscles, but all muscles feel fatigued). Negative for arthralgias and joint swelling.  Skin: Negative for rash.  Neurological: Positive for weakness and numbness. Negative for dizziness, light-headedness and headaches.    Objective:  BP 110/80 Comment: performed by Rachel-jaf  Pulse 96   Temp (!) 97.4 F (36.3 C) (Temporal)   Ht 5\' 2"  (1.575 m)   Wt 220 lb 3.2 oz (99.9 kg)   SpO2 96%   BMI 40.28 kg/m   Weight: 220 lb 3.2 oz (99.9 kg)   BP Readings from Last 3 Encounters:  09/07/19 110/80  08/24/19 110/80  08/16/19 124/74   Wt Readings from Last 3 Encounters:  09/07/19 220 lb 3.2 oz (99.9 kg)  08/24/19 223 lb 12.8 oz (101.5 kg)  08/16/19 224 lb 4.8 oz (101.7 kg)    Physical Exam Constitutional:      General: She is not in acute distress.    Appearance: She is well-developed. She is obese. She is not diaphoretic.  HENT:     Head: Normocephalic and atraumatic.     Right Ear: Tympanic membrane, ear canal and external ear normal.     Left Ear: Tympanic membrane, ear canal and external ear normal.     Nose: Nose normal.     Mouth/Throat:     Mouth: Mucous membranes are moist.  Eyes:     Conjunctiva/sclera: Conjunctivae normal.     Pupils: Pupils are equal, round, and reactive to light.  Neck:     Thyroid: No thyromegaly.  Cardiovascular:     Rate and Rhythm: Normal rate and regular rhythm.     Heart sounds: Normal heart sounds. No murmur. No friction rub. No gallop.   Pulmonary:     Effort: Pulmonary effort is normal. No respiratory distress.     Breath sounds: Normal breath sounds. No wheezing or rales.  Abdominal:     General: Bowel sounds are normal.  Musculoskeletal:     Cervical back: Neck supple.  Lymphadenopathy:     Cervical: No cervical adenopathy.   Skin:    General: Skin is warm and dry.  Neurological:     Mental Status: She is alert and oriented to person, place, and time.     Cranial Nerves: No cranial nerve deficit.     Motor: Weakness present. No tremor or abnormal muscle tone.     Gait: Gait abnormal.     Deep Tendon Reflexes: Reflexes normal. Babinski sign absent on the right side. Babinski sign absent on the left side.     Reflex Scores:      Tricep reflexes are 2+ on the right side and 2+ on the left side.      Bicep reflexes are 2+ on the right side and 2+ on the left side.      Brachioradialis reflexes are 2+ on the right side and 2+ on the left side.      Patellar reflexes are 2+ on the right side and 2+ on the left side.    Comments: Unable to stand without holding onto walker/counter due to feeling of weakness in legs.   4+/5 strength in testing of all extremities. NO atrophy noted. Grip strength is also 4+/5 bilat.   Psychiatric:        Behavior: Behavior normal.     Assessment/Plan:  We are going to get some additional blood work today.  I am concerned with her progressive weakness.  I have asked her to call Ortho and see if she can get her MRI done any sooner.  I do not feel that all weakness is secondary to cervical spine findings however since reflexes of her upper extremities are normal today.  If lab work is not revealing, I would consider neurology referral/further evaluation.  We also discussed amount of medications she is taking currently.  I will review list more specifically to see if we can consolidate or cut back.  1. Essential hypertension Blood pressure is stable today.  Continue current medications.  2. Allergic rhinitis, unspecified seasonality, unspecified trigger Taking Allegra.  Stable.  3. Primary osteoarthritis involving multiple joints Currently not having significant joint pain (at least not worse than baseline).  4. Dyslipidemia Taking pravastatin daily.  5. Weakness  Uncertain  etiology at this point.  We will start with some additional blood work and follow-up pending these results. - TSH; Future - CK; Future - ANA; Future - Sedimentation rate; Future - C-reactive protein; Future - TSH - CK - ANA - Sedimentation rate - C-reactive protein - Cortisol  6. Neuropathy See above. - TSH; Future - Protein Electrophoresis, Serum; Future - Protein Electrophoresis, Urine Rflx.; Future - TSH - Protein Electrophoresis, Serum - Protein Electrophoresis, Urine Rflx.  7. B12 deficiency b12 levels sufficiently replaced with last bloodwork - Folate; Future - Folate   Medications on her list with higher potential for cholinergic side effects: Flexeril  Return for pending labwork.  [name], MD

## 2019-09-07 NOTE — Patient Instructions (Signed)
Call ortho - see if MRI can be done sooner.   I will call you with bloodwork results when I get them and we will decide further evaluation pending this.

## 2019-09-09 ENCOUNTER — Telehealth: Payer: Self-pay | Admitting: *Deleted

## 2019-09-09 DIAGNOSIS — M542 Cervicalgia: Secondary | ICD-10-CM | POA: Diagnosis not present

## 2019-09-09 LAB — PROTEIN ELECTROPHORESIS, URINE REFLEX
Albumin ELP, Urine: 21.5 %
Alpha-1-Globulin, U: 5 %
Alpha-2-Globulin, U: 15.3 %
Beta Globulin, U: 29.1 %
Gamma Globulin, U: 29 %
Protein, Ur: 40.9 mg/dL

## 2019-09-09 LAB — PROTEIN ELECTROPHORESIS, SERUM
Albumin ELP: 3.8 g/dL (ref 3.8–4.8)
Alpha 1: 0.4 g/dL — ABNORMAL HIGH (ref 0.2–0.3)
Alpha 2: 1 g/dL — ABNORMAL HIGH (ref 0.5–0.9)
Beta 2: 0.5 g/dL (ref 0.2–0.5)
Beta Globulin: 0.4 g/dL (ref 0.4–0.6)
Gamma Globulin: 1.4 g/dL (ref 0.8–1.7)
Total Protein: 7.6 g/dL (ref 6.1–8.1)

## 2019-09-09 LAB — ANTI-NUCLEAR AB-TITER (ANA TITER): ANA Titer 1: 1:40 {titer} — ABNORMAL HIGH

## 2019-09-09 LAB — ANA: Anti Nuclear Antibody (ANA): POSITIVE — AB

## 2019-09-09 NOTE — Telephone Encounter (Signed)
Spoke with the pt and informed her of the message below.  Agreed to call back with response from the eye doctor.

## 2019-09-09 NOTE — Telephone Encounter (Signed)
-----   Message from Caren Macadam, MD sent at 09/09/2019  2:17 PM EST ----- System is blocking my response to your result note. I would advise her to call her eye surgeon and ask about the prednisone. Please let me know. It will just be a short 5 day burst to see how she feels with this. They might be interested as well to know how she has felt since surgery and that her sed rate is so elevated.

## 2019-09-09 NOTE — Progress Notes (Signed)
New message sent; system is not letting me respond to this result note.

## 2019-09-10 ENCOUNTER — Emergency Department (HOSPITAL_COMMUNITY): Payer: Medicare HMO

## 2019-09-10 ENCOUNTER — Other Ambulatory Visit: Payer: Self-pay

## 2019-09-10 ENCOUNTER — Emergency Department (HOSPITAL_COMMUNITY)
Admission: EM | Admit: 2019-09-10 | Discharge: 2019-09-11 | Disposition: A | Discharge status: Home / Self Care | Payer: Medicare HMO | Attending: Emergency Medicine | Admitting: Emergency Medicine

## 2019-09-10 ENCOUNTER — Encounter (HOSPITAL_COMMUNITY): Payer: Self-pay | Admitting: Emergency Medicine

## 2019-09-10 DIAGNOSIS — G9529 Other cord compression: Secondary | ICD-10-CM | POA: Insufficient documentation

## 2019-09-10 DIAGNOSIS — R0602 Shortness of breath: Secondary | ICD-10-CM | POA: Insufficient documentation

## 2019-09-10 DIAGNOSIS — R2689 Other abnormalities of gait and mobility: Secondary | ICD-10-CM | POA: Diagnosis not present

## 2019-09-10 DIAGNOSIS — R42 Dizziness and giddiness: Secondary | ICD-10-CM | POA: Diagnosis not present

## 2019-09-10 DIAGNOSIS — I10 Essential (primary) hypertension: Secondary | ICD-10-CM | POA: Diagnosis not present

## 2019-09-10 DIAGNOSIS — E119 Type 2 diabetes mellitus without complications: Secondary | ICD-10-CM | POA: Insufficient documentation

## 2019-09-10 DIAGNOSIS — M542 Cervicalgia: Secondary | ICD-10-CM | POA: Diagnosis not present

## 2019-09-10 DIAGNOSIS — R251 Tremor, unspecified: Secondary | ICD-10-CM | POA: Diagnosis not present

## 2019-09-10 DIAGNOSIS — Z7984 Long term (current) use of oral hypoglycemic drugs: Secondary | ICD-10-CM | POA: Diagnosis not present

## 2019-09-10 DIAGNOSIS — G9520 Unspecified cord compression: Secondary | ICD-10-CM

## 2019-09-10 DIAGNOSIS — G959 Disease of spinal cord, unspecified: Secondary | ICD-10-CM | POA: Diagnosis not present

## 2019-09-10 DIAGNOSIS — R531 Weakness: Secondary | ICD-10-CM | POA: Diagnosis not present

## 2019-09-10 DIAGNOSIS — M4802 Spinal stenosis, cervical region: Secondary | ICD-10-CM | POA: Diagnosis not present

## 2019-09-10 LAB — BASIC METABOLIC PANEL
Anion gap: 14 (ref 5–15)
BUN: 24 mg/dL — ABNORMAL HIGH (ref 8–23)
CO2: 26 mmol/L (ref 22–32)
Calcium: 9.4 mg/dL (ref 8.9–10.3)
Chloride: 99 mmol/L (ref 98–111)
Creatinine, Ser: 1.35 mg/dL — ABNORMAL HIGH (ref 0.44–1.00)
GFR calc Af Amer: 44 mL/min — ABNORMAL LOW (ref >60)
GFR calc non Af Amer: 38 mL/min — ABNORMAL LOW (ref >60)
Glucose, Bld: 145 mg/dL — ABNORMAL HIGH (ref 70–99)
Potassium: 3.4 mmol/L — ABNORMAL LOW (ref 3.5–5.1)
Sodium: 139 mmol/L (ref 135–145)

## 2019-09-10 LAB — URINALYSIS, ROUTINE W REFLEX MICROSCOPIC
Bilirubin Urine: NEGATIVE
Glucose, UA: NEGATIVE mg/dL
Hgb urine dipstick: NEGATIVE
Ketones, ur: 5 mg/dL — AB
Leukocytes,Ua: NEGATIVE
Nitrite: NEGATIVE
Protein, ur: NEGATIVE mg/dL
Specific Gravity, Urine: 1.01 (ref 1.005–1.030)
pH: 5 (ref 5.0–8.0)

## 2019-09-10 LAB — CBC
HCT: 35.5 % — ABNORMAL LOW (ref 36.0–46.0)
Hemoglobin: 11.8 g/dL — ABNORMAL LOW (ref 12.0–15.0)
MCH: 26.5 pg (ref 26.0–34.0)
MCHC: 33.2 g/dL (ref 30.0–36.0)
MCV: 79.8 fL — ABNORMAL LOW (ref 80.0–100.0)
Platelets: 164 10*3/uL (ref 150–400)
RBC: 4.45 MIL/uL (ref 3.87–5.11)
RDW: 16.5 % — ABNORMAL HIGH (ref 11.5–15.5)
WBC: 6.9 10*3/uL (ref 4.0–10.5)
nRBC: 0 % (ref 0.0–0.2)

## 2019-09-10 LAB — CBG MONITORING, ED
Glucose-Capillary: 137 mg/dL — ABNORMAL HIGH (ref 70–99)
Glucose-Capillary: 105 mg/dL — ABNORMAL HIGH (ref 70–99)

## 2019-09-10 LAB — BRAIN NATRIURETIC PEPTIDE: B Natriuretic Peptide: 19.1 pg/mL (ref 0.0–100.0)

## 2019-09-10 MED ORDER — SODIUM CHLORIDE 0.9% FLUSH: 3.0000 mL | Freq: Once | INTRAVENOUS | Status: DC

## 2019-09-10 NOTE — ED Notes (Signed)
Urine culture attached to sample  °

## 2019-09-10 NOTE — ED Notes (Signed)
Husband Doris Jackson called [phone] to get an update--I s/w nurse and doctor and was advised someone will call pat back after dr s/w specialist--Doris Jackson

## 2019-09-10 NOTE — ED Notes (Signed)
Doris Jackson  [phone] the husband please called with update

## 2019-09-10 NOTE — ED Provider Notes (Signed)
[facility] EMERGENCY DEPARTMENT Provider Note   CSN: [id number] Arrival date & time: 09/10/19  1450     History Chief Complaint  Patient presents with  . Weakness  . Tremors    Doris Jackson is a 79 y.o. female.  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  Patient reports before 3 weeks ago she was able to ambulate without assistance but in the last 3 weeks she has had worsening trouble walking.  She has also had neck pain in bilateral pain in the upper extremities and feeling like her hands are cramping with intermittent paresthesias.  She reports that she feels dizzy and off balance but also her legs feel weak bilaterally.  She has to use a walker now to walk due to the dizziness and feeling off balance.  She also states doing anything makes her extremely tired and in the last month or 2 she has noticed stiffness and pain throughout her body in multiple joints.  She has been following with her PCP and orthopedist.  She had blood work done in the office which showed a normal TSH, elevated sed rate of 100 and a positive ANA.  She had a normal CRP.  She reports that she had an MRI done of her neck and shoulder yesterday but does not have the results yet.  She denies any abdominal pain, chest pain or urinary problems.  She is still having bowel movements but reports she feels like she needs to sleep a lot.  She has had no significant vision change, swallowing issues or weight loss.  They have been adjusting her medication as they were thinking this may be the cause of some of her symptoms.  She denies fever, cough but does have shortness of breath with exertion.  She denies any headaches or pain around her temples.    The history is provided by the patient and medical records.  Weakness       Past Medical History:  Diagnosis Date  . ALLERGIC RHINITIS 06/17/2010  . ANEMIA-NOS 06/17/2010  . BREAST CANCER, HX OF 06/17/2010  . Cancer [facility]) 2008   right breast  . Cataract   . COPD 06/17/2010  . DIABETES MELLITUS, TYPE II 06/17/2010  . Endometriosis   . Fibroid    FIBROID  . HYPERLIPIDEMIA 06/17/2010  . HYPERTENSION 06/17/2010  . Leg swelling   . LOW BACK PAIN 06/17/2010  . OSTEOARTHRITIS 06/17/2010  . OSTEOPENIA 06/17/2010  . Weakness     Patient Active Problem List   Diagnosis Date Noted  . Lumbar stenosis 08/24/2019  . Obesity, morbid, BMI 40.0-49.9 ([place]) 04/19/2018  . Cataract   . Fibroid   . Endometriosis   . DM (diabetes mellitus) type II controlled with renal manifestation ([place]) 06/17/2010  . Dyslipidemia 06/17/2010  . ANEMIA-NOS 06/17/2010  . Essential hypertension 06/17/2010  . Allergic rhinitis 06/17/2010  . Osteoarthritis 06/17/2010  . LOW BACK PAIN 06/17/2010  . OSTEOPENIA 06/17/2010  . BREAST CANCER, HX OF 06/17/2010    Past Surgical History:  Procedure Laterality Date  . ABDOMINAL HYSTERECTOMY  1988   TAH,LSO  . BREAST LUMPECTOMY  2007   LUMPECTOMY FOLLOWED BY RADIATION  . OOPHORECTOMY  1988   TAH,LSO  . TUBAL LIGATION       OB History    Gravida  3   Para  3  Term  3   Preterm      AB  0   Living  2     SAB      TAB      Ectopic      Multiple      Live Births              Family History  Problem Relation Age of Onset  . Diabetes Mother   . Hypertension Sister   . Asthma Father   . Diabetes Brother     Social History   Tobacco Use  . Smoking status: Never Smoker  . Smokeless tobacco: Never Used  Substance Use Topics  . Alcohol use: No  . Drug use: No    Home Medications Prior to Admission medications   Medication Sig Start Date End Date Taking? Authorizing Provider  amLODipine-benazepril (LOTREL) 5-40 MG capsule TAKE 1 CAPSULE BY MOUTH EVERY DAY 05/30/19   Isaac Bliss, Rayford Halsted, MD   Ascorbic Acid (VITAMIN C) 1000 MG tablet Take 1,000 mg by mouth 2 (two) times daily.    [provider]  Azelastine-Fluticasone 137-50 MCG/ACT SUSP Place 1 spray into both nostrils daily. 09/25/15   Marletta Lor, MD  Cholecalciferol (VITAMIN D) 2000 UNITS CAPS Take 1 capsule by mouth daily. Taking 2 caps daily 4,000    [provider]  cyanocobalamin 2000 MCG tablet Take 2,000 mcg by mouth daily.      [provider]  cyclobenzaprine (FLEXERIL) 5 MG tablet Take 1 tablet (5 mg total) by mouth at bedtime. 12/23/16   Marletta Lor, MD  famotidine (PEPCID) 20 MG tablet Take 20 mg by mouth 2 (two) times daily.    [provider]  fexofenadine (ALLEGRA) 180 MG tablet Take 180 mg by mouth daily as needed.     [provider]  furosemide (LASIX) 20 MG tablet Take 1 tablet (20 mg total) by mouth daily as needed. 02/25/18   Marletta Lor, MD  glipiZIDE (GLUCOTROL XL) 5 MG 24 hr tablet TAKE 1 TABLET BY MOUTH EVERY DAY 06/07/19   Isaac Bliss, Rayford Halsted, MD  glucose blood (ONETOUCH VERIO) test strip TEST TWICE A DAY.  Dx E11.9 10/28/18   Isaac Bliss, Rayford Halsted, MD  hydrochlorothiazide (HYDRODIURIL) 25 MG tablet Take 1 tablet (25 mg total) by mouth daily. 06/09/19   Isaac Bliss, Rayford Halsted, MD  HYDROcodone-acetaminophen (NORCO/VICODIN) 5-325 MG tablet Take 1 tablet by mouth 2 (two) times daily as needed for moderate pain. 11/11/18   Isaac Bliss, Rayford Halsted, MD  HYDROcodone-acetaminophen (NORCO/VICODIN) 5-325 MG tablet Take 1 tablet by mouth 2 (two) times daily as needed. 11/11/18   Isaac Bliss, Rayford Halsted, MD  HYDROcodone-acetaminophen (NORCO/VICODIN) 5-325 MG tablet Take 1 tablet by mouth 2 (two) times daily as needed for moderate pain. 11/11/18   Isaac Bliss, Rayford Halsted, MD  iron polysaccharides (FERREX 150) 150 MG capsule Take 1 capsule (150 mg total) by mouth daily. 11/02/18   Isaac Bliss, Rayford Halsted, MD  Lancets 30G MISC USE  TWICE DAILY AS DIRECTED FOR GLUCOSE TESTING AND MONITORING 01/19/14   Marletta Lor, MD  Magnesium 250 MG TABS Take 1 tablet by mouth as needed.     [provider]  metFORMIN (GLUCOPHAGE XR) 500 MG 24 hr tablet Take 1 tablet (500 mg total) by mouth 2 (two) times daily. 08/16/19   Burchette, Alinda Sierras, MD  metoprolol succinate (TOPROL-XL) 50 MG 24 hr tablet TAKE 1 TABLET BY MOUTH EVERY  MORNING. TAKE WITH OR IMMEDIATELY FOLLOWING A MEAL. 03/18/19   Isaac Bliss, Rayford Halsted, MD  Multiple Vitamin (MULTIVITAMIN) tablet Take 1 tablet by mouth daily.      [provider]  omeprazole (PRILOSEC) 40 MG capsule Take 40 mg by mouth at bedtime. 08/03/19   [provider]  ONE TOUCH LANCETS MISC Test twice daily. 08/01/14   Marletta Lor, MD  pioglitazone (ACTOS) 45 MG tablet TAKE 1 TABLET BY MOUTH EVERY DAY 05/27/19   Isaac Bliss, Rayford Halsted, MD  pravastatin (PRAVACHOL) 40 MG tablet Take 1 tablet (40 mg total) by mouth daily. 02/25/18   Marletta Lor, MD  Probiotic Product (PROBIOTIC-10 PO) Take by mouth.    [provider]  saccharomyces boulardii (FLORASTOR) 250 MG capsule Take 1 capsule (250 mg total) by mouth 2 (two) times daily. 12/24/18   Isaac Bliss, Rayford Halsted, MD  sitaGLIPtin (JANUVIA) 100 MG tablet Take 1 tablet (100 mg total) by mouth daily. 06/02/19   Isaac Bliss, Rayford Halsted, MD  triamcinolone (NASACORT ALLERGY 24HR) 55 MCG/ACT AERO nasal inhaler Place 2 sprays into the nose daily.    [provider]  triamcinolone cream (KENALOG) 0.1 % Apply 1 application topically 2 (two) times daily. 05/08/15   Dorothyann Peng, NP    Allergies    Patient has no known allergies.  Review of Systems   Review of Systems  Neurological: Positive for weakness.  All other systems reviewed and are negative.   Physical Exam Updated Vital Signs BP (!) 148/89 (BP Location: Left Arm)   Pulse (!) 111   Temp 98.7 F (37.1 C) (Oral)   Resp 17   Ht 5'  2" (1.575 m)   Wt 99.8 kg   SpO2 97%   BMI 40.24 kg/m   Physical Exam Vitals and nursing note reviewed.  Constitutional:      General: She is not in acute distress.    Appearance: She is well-developed. She is obese.  HENT:     Head: Normocephalic and atraumatic.     Mouth/Throat:     Mouth: Mucous membranes are moist.  Eyes:     General: No visual field deficit.    Extraocular Movements: Extraocular movements intact.     Pupils: Pupils are equal, round, and reactive to light.     Comments: Mild conjunctival injection of the left eye  Neck:     Comments: JVD noted at 90 degrees Cardiovascular:     Rate and Rhythm: Regular rhythm. Tachycardia present.     Heart sounds: Normal heart sounds. No murmur. No friction rub.  Pulmonary:     Effort: Pulmonary effort is normal.     Breath sounds: Normal breath sounds. No wheezing or rales.  Abdominal:     General: Bowel sounds are normal. There is no distension.     Palpations: Abdomen is soft.     Tenderness: There is no abdominal tenderness. There is no guarding or rebound.  Musculoskeletal:        General: No tenderness. Normal range of motion.     Right lower leg: Edema present.     Left lower leg: Edema present.     Comments: Trace edema in bilateral ankles  Skin:    General: Skin is warm and dry.     Capillary Refill: Capillary refill takes less than 2 seconds.     Findings: No rash.  Neurological:     Mental Status: She is alert and oriented to person, place, and  time.     Cranial Nerves: No cranial nerve deficit, dysarthria or facial asymmetry.     Sensory: Sensation is intact.     Motor: Weakness present. No tremor.     Coordination: Finger-Nose-Finger Test abnormal and Heel to [facility] Test abnormal.     Deep Tendon Reflexes: Reflexes normal. Babinski sign absent on the right side. Babinski sign absent on the left side.     Reflex Scores:      Tricep reflexes are 1+ on the right side and 1+ on the left side.      Patellar  reflexes are 1+ on the right side and 1+ on the left side.      Achilles reflexes are 1+ on the right side and 1+ on the left side.    Comments: 4 out of 5 strength in bilateral lower extremities.  Difficulty performing heel-to-shin with bilateral legs which is clumsy.  Some difficulty with finger-to-nose but with concentration can do it accurately.  4/5 strength in bilateral upper ext  Psychiatric:        Mood and Affect: Mood normal.        Behavior: Behavior normal.        Thought Content: Thought content normal.     ED Results / Procedures / Treatments   Labs (all labs ordered are listed, but only abnormal results are displayed) Labs Reviewed  BASIC METABOLIC PANEL - Abnormal; Notable for the following components:      Result Value   Potassium 3.4 (*)    Glucose, Bld 145 (*)    BUN 24 (*)    Creatinine, Ser 1.35 (*)    GFR calc non Af Amer 38 (*)    GFR calc Af Amer 44 (*)    All other components within normal limits  CBC - Abnormal; Notable for the following components:   Hemoglobin 11.8 (*)    HCT 35.5 (*)    MCV 79.8 (*)    RDW 16.5 (*)    All other components within normal limits  URINALYSIS, ROUTINE W REFLEX MICROSCOPIC - Abnormal; Notable for the following components:   Ketones, ur 5 (*)    All other components within normal limits  CBG MONITORING, ED    EKG EKG Interpretation  Date/Time:  Saturday September 10 2019 15:29:49 EST Ventricular Rate:  100 PR Interval:  166 QRS Duration: 84 QT Interval:  360 QTC Calculation: 464 R Axis:   113 Text Interpretation: Normal sinus rhythm Right axis deviation No significant change since last tracing Confirmed by Blanchie Dessert [phone]) on 09/10/2019 5:55:58 PM   Radiology No results found.  Procedures Procedures (including critical care time)  Medications Ordered in ED Medications  sodium chloride flush (NS) 0.9 % injection 3 mL (has no administration in time range)    ED Course  I have reviewed the triage  vital signs and the nursing notes.  Pertinent labs & imaging results that were available during my care of the patient were reviewed by me and considered in my medical decision making (see chart for details).    MDM Rules/Calculators/A&P                      Elderly female presenting today with worsening gait disturbance.  Patient does also report some dizziness and needing to use a walker to keep her balance and help with her tired and weak legs.  Patient has been following up with her PCP and saw Dr. Nelva Bush and had MRI  done yesterday which we do not have results of and she is not sure what they did an MRI of.  She reports that in the last 3 weeks are when her difficulty walking started and she has not had any imaging of her brain.  She denies any headaches.  She has had some change in medications and labs done from PCPs office showed a normal thyroid but elevated sed rate and they were concerned for some type of inflammatory process.  There is still undergoing further evaluation but wanted to start her on prednisone if it was okay with her ophthalmologist after having a corneal transplant.  On my exam patient does have some coordination difficulty and does report a sensation of being off balance when attempting to walk with her given risk factors concern for possible stroke.  Also possible some other inflammatory process.  Patient is complaining of some shortness of breath and extreme fatigue with activity but denies any chest pain.  BMP, BNP and CBC without significant change from recent labs.  Cortisol levels were normal.  CK and CRP were normal.  We will do an MRI to rule out evidence of brain lesions or stroke.  Patient at this time in no acute distress and denies significant pain.  However she has also been having paresthesias and bilateral upper arm pain.  11:47 PM Spoke with Dr. Christella Noa who will see the pt in the office on Monday to discuss decompression.  Pt wishes to do this as she wants her  husband to be present for questions.  Spoke with patient's eye doctor and they said it would be fine for her to start on Decadron.  She was given a dose in the emergency room and sent home with a prescription.  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

## 2019-09-10 NOTE — ED Notes (Signed)
Simona Huh  [id number] the husband please called with update

## 2019-09-10 NOTE — ED Triage Notes (Signed)
Pt states over the last month she has had generalized weakness and tremors in arms.  States she hasn't been able to walk over the past few weeks.  Had MRI yesterday but doesn't have results.

## 2019-09-10 NOTE — ED Notes (Signed)
Pt to MRI

## 2019-09-11 DIAGNOSIS — N179 Acute kidney failure, unspecified: Secondary | ICD-10-CM | POA: Diagnosis not present

## 2019-09-11 DIAGNOSIS — M4802 Spinal stenosis, cervical region: Secondary | ICD-10-CM | POA: Diagnosis not present

## 2019-09-11 MED ORDER — DEXAMETHASONE 4 MG PO TABS
4.0000 mg | ORAL_TABLET | Freq: Once | ORAL | Status: AC
  Administered 2019-09-11: 01:00:00 4 mg via ORAL
  Filled 2019-09-11: qty 1

## 2019-09-11 MED ORDER — DEXAMETHASONE 4 MG PO TABS: 4.0000 mg | ORAL_TABLET | Freq: Two times a day (BID) | ORAL | 0 refills | Status: DC

## 2019-09-11 NOTE — Discharge Instructions (Signed)
The MRI today showed that you had bad arthritis in your neck that is pushing on your spinal cord causing your difficulty walking and pain in your arms.  Dr. Christella Noa wants to see you in the office on Monday and you just need to call Monday morning for the appointment.  Tell them he is expecting to see you on Monday.  In the meantime take the steroids daily until you see him.

## 2019-09-12 ENCOUNTER — Encounter: Payer: Medicare HMO | Admitting: Physical Therapy

## 2019-09-13 ENCOUNTER — Telehealth: Payer: Self-pay | Admitting: Family Medicine

## 2019-09-13 DIAGNOSIS — J301 Allergic rhinitis due to pollen: Secondary | ICD-10-CM | POA: Diagnosis not present

## 2019-09-13 DIAGNOSIS — J3089 Other allergic rhinitis: Secondary | ICD-10-CM | POA: Diagnosis not present

## 2019-09-13 NOTE — Telephone Encounter (Signed)
Although I am not opposed to her having additional steroids; I would like to see what the other specialists do this week and it looks like from the system that she will be good with the dexamethasone until Friday morning? They may adjust dosing based on images/exam. Can we just instruct her to call back on Friday with updated plans from specialty visits this week? (If I am incorrect about supply and she is going to run out sooner let me know)

## 2019-09-13 NOTE — Telephone Encounter (Signed)
Pt is calling in statin that she would like to have predisone and the eye doctor said that it is okay for her to have.  Pt was given dexamethsone 4 MG twice a day for only 10 day by the hospital.  Pharm: [facility] on [address].  Pt is waiting on an appointment with Dr. Rolena Infante some time this week and she has an appointment scheduled with Dr. Ashok Pall Thursday at 10:30.  Pt just wanted to see if Dr. Ethlyn Gallery could call in the predisone for her.

## 2019-09-14 ENCOUNTER — Other Ambulatory Visit: Payer: Self-pay

## 2019-09-14 ENCOUNTER — Inpatient Hospital Stay (HOSPITAL_COMMUNITY)
Admission: EM | Admit: 2019-09-14 | Discharge: 2019-09-22 | DRG: 472 | Disposition: A | Discharge status: Rehabilitation Facility | Payer: Medicare HMO | Attending: Internal Medicine | Admitting: Internal Medicine

## 2019-09-14 ENCOUNTER — Encounter: Payer: Medicare HMO | Admitting: Physical Therapy

## 2019-09-14 DIAGNOSIS — M4722 Other spondylosis with radiculopathy, cervical region: Secondary | ICD-10-CM | POA: Diagnosis not present

## 2019-09-14 DIAGNOSIS — N179 Acute kidney failure, unspecified: Secondary | ICD-10-CM | POA: Diagnosis not present

## 2019-09-14 DIAGNOSIS — R7309 Other abnormal glucose: Secondary | ICD-10-CM | POA: Diagnosis not present

## 2019-09-14 DIAGNOSIS — M50022 Cervical disc disorder at C5-C6 level with myelopathy: Secondary | ICD-10-CM | POA: Diagnosis not present

## 2019-09-14 DIAGNOSIS — M4802 Spinal stenosis, cervical region: Principal | ICD-10-CM | POA: Diagnosis present

## 2019-09-14 DIAGNOSIS — M4712 Other spondylosis with myelopathy, cervical region: Secondary | ICD-10-CM | POA: Diagnosis present

## 2019-09-14 DIAGNOSIS — Z833 Family history of diabetes mellitus: Secondary | ICD-10-CM

## 2019-09-14 DIAGNOSIS — Z20822 Contact with and (suspected) exposure to covid-19: Secondary | ICD-10-CM | POA: Diagnosis not present

## 2019-09-14 DIAGNOSIS — D509 Iron deficiency anemia, unspecified: Secondary | ICD-10-CM | POA: Diagnosis present

## 2019-09-14 DIAGNOSIS — Z90710 Acquired absence of both cervix and uterus: Secondary | ICD-10-CM | POA: Diagnosis not present

## 2019-09-14 DIAGNOSIS — R531 Weakness: Secondary | ICD-10-CM | POA: Insufficient documentation

## 2019-09-14 DIAGNOSIS — Z947 Corneal transplant status: Secondary | ICD-10-CM

## 2019-09-14 DIAGNOSIS — Z681 Body mass index (BMI) 19 or less, adult: Secondary | ICD-10-CM

## 2019-09-14 DIAGNOSIS — R200 Anesthesia of skin: Secondary | ICD-10-CM | POA: Diagnosis present

## 2019-09-14 DIAGNOSIS — E669 Obesity, unspecified: Secondary | ICD-10-CM | POA: Diagnosis not present

## 2019-09-14 DIAGNOSIS — Z79899 Other long term (current) drug therapy: Secondary | ICD-10-CM

## 2019-09-14 DIAGNOSIS — Z853 Personal history of malignant neoplasm of breast: Secondary | ICD-10-CM | POA: Diagnosis not present

## 2019-09-14 DIAGNOSIS — M5001 Cervical disc disorder with myelopathy,  high cervical region: Secondary | ICD-10-CM | POA: Diagnosis not present

## 2019-09-14 DIAGNOSIS — Z8249 Family history of ischemic heart disease and other diseases of the circulatory system: Secondary | ICD-10-CM

## 2019-09-14 DIAGNOSIS — Z01811 Encounter for preprocedural respiratory examination: Secondary | ICD-10-CM

## 2019-09-14 DIAGNOSIS — J449 Chronic obstructive pulmonary disease, unspecified: Secondary | ICD-10-CM | POA: Diagnosis present

## 2019-09-14 DIAGNOSIS — M79602 Pain in left arm: Secondary | ICD-10-CM | POA: Diagnosis present

## 2019-09-14 DIAGNOSIS — J302 Other seasonal allergic rhinitis: Secondary | ICD-10-CM | POA: Diagnosis present

## 2019-09-14 DIAGNOSIS — R7989 Other specified abnormal findings of blood chemistry: Secondary | ICD-10-CM | POA: Diagnosis not present

## 2019-09-14 DIAGNOSIS — M8580 Other specified disorders of bone density and structure, unspecified site: Secondary | ICD-10-CM | POA: Diagnosis present

## 2019-09-14 DIAGNOSIS — E1129 Type 2 diabetes mellitus with other diabetic kidney complication: Secondary | ICD-10-CM | POA: Diagnosis not present

## 2019-09-14 DIAGNOSIS — E785 Hyperlipidemia, unspecified: Secondary | ICD-10-CM | POA: Diagnosis present

## 2019-09-14 DIAGNOSIS — K219 Gastro-esophageal reflux disease without esophagitis: Secondary | ICD-10-CM | POA: Diagnosis not present

## 2019-09-14 DIAGNOSIS — Z981 Arthrodesis status: Secondary | ICD-10-CM | POA: Diagnosis not present

## 2019-09-14 DIAGNOSIS — M50021 Cervical disc disorder at C4-C5 level with myelopathy: Secondary | ICD-10-CM | POA: Diagnosis not present

## 2019-09-14 DIAGNOSIS — D62 Acute posthemorrhagic anemia: Secondary | ICD-10-CM | POA: Diagnosis not present

## 2019-09-14 DIAGNOSIS — Z825 Family history of asthma and other chronic lower respiratory diseases: Secondary | ICD-10-CM | POA: Diagnosis not present

## 2019-09-14 DIAGNOSIS — E1169 Type 2 diabetes mellitus with other specified complication: Secondary | ICD-10-CM | POA: Diagnosis not present

## 2019-09-14 DIAGNOSIS — I10 Essential (primary) hypertension: Secondary | ICD-10-CM | POA: Diagnosis present

## 2019-09-14 DIAGNOSIS — R1310 Dysphagia, unspecified: Secondary | ICD-10-CM | POA: Diagnosis not present

## 2019-09-14 DIAGNOSIS — Z7984 Long term (current) use of oral hypoglycemic drugs: Secondary | ICD-10-CM

## 2019-09-14 DIAGNOSIS — R0602 Shortness of breath: Secondary | ICD-10-CM | POA: Diagnosis not present

## 2019-09-14 DIAGNOSIS — R202 Paresthesia of skin: Secondary | ICD-10-CM | POA: Diagnosis present

## 2019-09-14 DIAGNOSIS — F411 Generalized anxiety disorder: Secondary | ICD-10-CM | POA: Diagnosis not present

## 2019-09-14 DIAGNOSIS — G959 Disease of spinal cord, unspecified: Secondary | ICD-10-CM | POA: Diagnosis not present

## 2019-09-14 DIAGNOSIS — M79601 Pain in right arm: Secondary | ICD-10-CM | POA: Diagnosis not present

## 2019-09-14 DIAGNOSIS — M4322 Fusion of spine, cervical region: Secondary | ICD-10-CM | POA: Diagnosis not present

## 2019-09-14 DIAGNOSIS — Z419 Encounter for procedure for purposes other than remedying health state, unspecified: Secondary | ICD-10-CM

## 2019-09-14 DIAGNOSIS — G8918 Other acute postprocedural pain: Secondary | ICD-10-CM | POA: Diagnosis not present

## 2019-09-14 LAB — COMPREHENSIVE METABOLIC PANEL
ALT: 14 U/L (ref 0–44)
AST: 14 U/L — ABNORMAL LOW (ref 15–41)
Albumin: 3.5 g/dL (ref 3.5–5.0)
Alkaline Phosphatase: 62 U/L (ref 38–126)
Anion gap: 15 (ref 5–15)
BUN: 65 mg/dL — ABNORMAL HIGH (ref 8–23)
CO2: 23 mmol/L (ref 22–32)
Calcium: 9.1 mg/dL (ref 8.9–10.3)
Chloride: 100 mmol/L (ref 98–111)
Creatinine, Ser: 1.78 mg/dL — ABNORMAL HIGH (ref 0.44–1.00)
GFR calc Af Amer: 31 mL/min — ABNORMAL LOW (ref >60)
GFR calc non Af Amer: 27 mL/min — ABNORMAL LOW (ref >60)
Glucose, Bld: 247 mg/dL — ABNORMAL HIGH (ref 70–99)
Potassium: 4.1 mmol/L (ref 3.5–5.1)
Sodium: 138 mmol/L (ref 135–145)
Total Bilirubin: 0.5 mg/dL (ref 0.3–1.2)
Total Protein: 7.4 g/dL (ref 6.5–8.1)

## 2019-09-14 LAB — MAGNESIUM: Magnesium: 1.8 mg/dL (ref 1.7–2.4)

## 2019-09-14 LAB — CBC
HCT: 34.8 % — ABNORMAL LOW (ref 36.0–46.0)
Hemoglobin: 11.6 g/dL — ABNORMAL LOW (ref 12.0–15.0)
MCH: 26.3 pg (ref 26.0–34.0)
MCHC: 33.3 g/dL (ref 30.0–36.0)
MCV: 78.9 fL — ABNORMAL LOW (ref 80.0–100.0)
Platelets: 167 10*3/uL (ref 150–400)
RBC: 4.41 MIL/uL (ref 3.87–5.11)
RDW: 16.2 % — ABNORMAL HIGH (ref 11.5–15.5)
WBC: 10 10*3/uL (ref 4.0–10.5)
nRBC: 0 % (ref 0.0–0.2)

## 2019-09-14 LAB — IRON AND TIBC
Iron: 46 ug/dL (ref 28–170)
Saturation Ratios: 15 % (ref 10.4–31.8)
TIBC: 308 ug/dL (ref 250–450)
UIBC: 262 ug/dL

## 2019-09-14 LAB — SARS CORONAVIRUS 2 (TAT 6-24 HRS): SARS Coronavirus 2: NEGATIVE

## 2019-09-14 LAB — FERRITIN: Ferritin: 92 ng/mL (ref 11–307)

## 2019-09-14 LAB — GLUCOSE, CAPILLARY
Glucose-Capillary: 203 mg/dL — ABNORMAL HIGH (ref 70–99)
Glucose-Capillary: 174 mg/dL — ABNORMAL HIGH (ref 70–99)

## 2019-09-14 MED ORDER — AZELASTINE-FLUTICASONE 137-50 MCG/ACT NA SUSP: 1.0000 | Freq: Every day | NASAL | Status: DC | PRN

## 2019-09-14 MED ORDER — POLYSACCHARIDE IRON COMPLEX 150 MG PO CAPS
150.0000 mg | ORAL_CAPSULE | Freq: Every day | ORAL | Status: DC
  Administered 2019-09-15 – 2019-09-22 (×8): 150 mg via ORAL
  Filled 2019-09-14 (×8): qty 1

## 2019-09-14 MED ORDER — METOPROLOL SUCCINATE ER 50 MG PO TB24
50.0000 mg | ORAL_TABLET | Freq: Every day | ORAL | Status: DC
  Administered 2019-09-15 – 2019-09-22 (×8): 50 mg via ORAL
  Filled 2019-09-14 (×8): qty 1

## 2019-09-14 MED ORDER — INSULIN ASPART 100 UNIT/ML ~~LOC~~ SOLN
0.0000 [IU] | Freq: Three times a day (TID) | SUBCUTANEOUS | Status: DC
  Administered 2019-09-14: 5 [IU] via SUBCUTANEOUS
  Administered 2019-09-16 (×2): 3 [IU] via SUBCUTANEOUS
  Administered 2019-09-16: 8 [IU] via SUBCUTANEOUS
  Administered 2019-09-17 (×2): 5 [IU] via SUBCUTANEOUS
  Administered 2019-09-17 – 2019-09-18 (×3): 3 [IU] via SUBCUTANEOUS
  Administered 2019-09-18: 5 [IU] via SUBCUTANEOUS
  Administered 2019-09-19: 08:00:00 2 [IU] via SUBCUTANEOUS
  Administered 2019-09-19: 15 [IU] via SUBCUTANEOUS
  Administered 2019-09-20: 2 [IU] via SUBCUTANEOUS
  Administered 2019-09-20: 3 [IU] via SUBCUTANEOUS
  Administered 2019-09-20: 5 [IU] via SUBCUTANEOUS
  Administered 2019-09-21: 2 [IU] via SUBCUTANEOUS
  Administered 2019-09-21: 3 [IU] via SUBCUTANEOUS
  Administered 2019-09-21: 5 [IU] via SUBCUTANEOUS
  Administered 2019-09-22: 2 [IU] via SUBCUTANEOUS
  Administered 2019-09-22: 14:00:00 5 [IU] via SUBCUTANEOUS

## 2019-09-14 MED ORDER — PREDNISOLONE ACETATE 1 % OP SUSP
1.0000 [drp] | Freq: Two times a day (BID) | OPHTHALMIC | Status: DC
  Administered 2019-09-14 – 2019-09-22 (×16): 1 [drp] via OPHTHALMIC
  Filled 2019-09-14: qty 5

## 2019-09-14 MED ORDER — ACETAMINOPHEN 650 MG RE SUPP: 650.0000 mg | Freq: Four times a day (QID) | RECTAL | Status: DC | PRN

## 2019-09-14 MED ORDER — AZELASTINE HCL 0.1 % NA SOLN: 1.0000 | Freq: Every day | NASAL | Status: DC | PRN

## 2019-09-14 MED ORDER — PIOGLITAZONE HCL 45 MG PO TABS
45.0000 mg | ORAL_TABLET | Freq: Every day | ORAL | Status: DC
  Administered 2019-09-15 – 2019-09-22 (×8): 45 mg via ORAL
  Filled 2019-09-14 (×8): qty 1

## 2019-09-14 MED ORDER — PREDNISOLONE ACETATE 1 % OP SUSP: 1.0000 [drp] | OPHTHALMIC | Status: DC

## 2019-09-14 MED ORDER — FLUTICASONE PROPIONATE 50 MCG/ACT NA SUSP: 1.0000 | Freq: Every day | NASAL | Status: DC | PRN

## 2019-09-14 MED ORDER — GLIPIZIDE ER 5 MG PO TB24
5.0000 mg | ORAL_TABLET | Freq: Every day | ORAL | Status: DC
  Administered 2019-09-15 – 2019-09-22 (×8): 5 mg via ORAL
  Filled 2019-09-14 (×8): qty 1

## 2019-09-14 MED ORDER — AMLODIPINE BESYLATE 5 MG PO TABS
5.0000 mg | ORAL_TABLET | Freq: Every day | ORAL | Status: DC
  Administered 2019-09-15 – 2019-09-22 (×8): 5 mg via ORAL
  Filled 2019-09-14 (×8): qty 1

## 2019-09-14 MED ORDER — INSULIN ASPART 100 UNIT/ML ~~LOC~~ SOLN
0.0000 [IU] | Freq: Every day | SUBCUTANEOUS | Status: DC
  Administered 2019-09-15: 3 [IU] via SUBCUTANEOUS
  Administered 2019-09-16: 2 [IU] via SUBCUTANEOUS
  Administered 2019-09-17: 5 [IU] via SUBCUTANEOUS
  Administered 2019-09-19: 4 [IU] via SUBCUTANEOUS
  Administered 2019-09-21: 22:00:00 3 [IU] via SUBCUTANEOUS

## 2019-09-14 MED ORDER — ACETAMINOPHEN 325 MG PO TABS: 650.0000 mg | ORAL_TABLET | Freq: Four times a day (QID) | ORAL | Status: DC | PRN

## 2019-09-14 MED ORDER — LORATADINE 10 MG PO TABS
10.0000 mg | ORAL_TABLET | Freq: Every day | ORAL | Status: DC
  Administered 2019-09-15 – 2019-09-22 (×8): 10 mg via ORAL
  Filled 2019-09-14 (×8): qty 1

## 2019-09-14 MED ORDER — AMLODIPINE BESY-BENAZEPRIL HCL 5-40 MG PO CAPS: 1.0000 | ORAL_CAPSULE | Freq: Every day | ORAL | Status: DC

## 2019-09-14 MED ORDER — SODIUM CHLORIDE 0.9 % IV SOLN: INTRAVENOUS | Status: DC

## 2019-09-14 MED ORDER — TRIAMCINOLONE ACETONIDE 55 MCG/ACT NA AERO: 2.0000 | INHALATION_SPRAY | Freq: Every day | NASAL | Status: DC | PRN

## 2019-09-14 MED ORDER — HYDROCHLOROTHIAZIDE 25 MG PO TABS
25.0000 mg | ORAL_TABLET | Freq: Every day | ORAL | Status: DC
  Administered 2019-09-15 – 2019-09-22 (×8): 25 mg via ORAL
  Filled 2019-09-14 (×8): qty 1

## 2019-09-14 MED ORDER — PREDNISOLONE ACETATE 1 % OP SUSP
1.0000 [drp] | Freq: Four times a day (QID) | OPHTHALMIC | Status: DC
  Administered 2019-09-14 – 2019-09-22 (×29): 1 [drp] via OPHTHALMIC
  Filled 2019-09-14: qty 1

## 2019-09-14 MED ORDER — DEXAMETHASONE 4 MG PO TABS
4.0000 mg | ORAL_TABLET | Freq: Two times a day (BID) | ORAL | Status: DC
  Administered 2019-09-15: 4 mg via ORAL
  Filled 2019-09-14: qty 1

## 2019-09-14 MED ORDER — ONDANSETRON HCL 4 MG/2ML IJ SOLN: 4.0000 mg | Freq: Four times a day (QID) | INTRAMUSCULAR | Status: DC | PRN

## 2019-09-14 MED ORDER — ONDANSETRON HCL 4 MG PO TABS: 4.0000 mg | ORAL_TABLET | Freq: Four times a day (QID) | ORAL | Status: DC | PRN

## 2019-09-14 MED ORDER — PANTOPRAZOLE SODIUM 40 MG PO TBEC
40.0000 mg | DELAYED_RELEASE_TABLET | Freq: Every day | ORAL | Status: DC
  Administered 2019-09-14 – 2019-09-21 (×8): 40 mg via ORAL
  Filled 2019-09-14 (×8): qty 1

## 2019-09-14 MED ORDER — PRAVASTATIN SODIUM 40 MG PO TABS
40.0000 mg | ORAL_TABLET | Freq: Every day | ORAL | Status: DC
  Administered 2019-09-14 – 2019-09-22 (×8): 40 mg via ORAL
  Filled 2019-09-14 (×10): qty 1

## 2019-09-14 MED ORDER — BENAZEPRIL HCL 20 MG PO TABS
40.0000 mg | ORAL_TABLET | Freq: Every day | ORAL | Status: DC
  Administered 2019-09-16 – 2019-09-22 (×7): 40 mg via ORAL
  Filled 2019-09-14 (×8): qty 2

## 2019-09-14 NOTE — ED Notes (Signed)
Hospitalist at bedside 

## 2019-09-14 NOTE — Telephone Encounter (Signed)
Left a message for the pt to return my call and message sent to PCP.

## 2019-09-14 NOTE — ED Provider Notes (Signed)
[facility] EMERGENCY DEPARTMENT Provider Note   CSN: [id number] Arrival date & time: 09/14/19  1119     History No chief complaint on file.   Doris Jackson is a 79 y.o. female.  HPI Patient returns for hand and foot pain.  States she is having trouble using her hands now.  Seen in the ER 4 days ago and had cord compression or cervical spine for spinal stenosis.  Patient states that they wanted to admit her but she wanted to go home to talk with her husband.  Had been discussed with Dr. Christella Noa from neurosurgery recommended steroids and follow-up in the office on Monday.  However it appears as if the appointment was now for tomorrow,/Thursday.  Patient states she is gotten worse.  States she has been having more trouble walking.  States she is having trouble using her hands.  States steroids have not helped and things are getting potentially worse.    Past Medical History:  Diagnosis Date  . ALLERGIC RHINITIS 06/17/2010  . ANEMIA-NOS 06/17/2010  . BREAST CANCER, HX OF 06/17/2010  . Cancer [facility]) 2008   right breast  . Cataract   . COPD 06/17/2010  . DIABETES MELLITUS, TYPE II 06/17/2010  . Endometriosis   . Fibroid    FIBROID  . HYPERLIPIDEMIA 06/17/2010  . HYPERTENSION 06/17/2010  . Leg swelling   . LOW BACK PAIN 06/17/2010  . OSTEOARTHRITIS 06/17/2010  . OSTEOPENIA 06/17/2010  . Weakness     Patient Active Problem List   Diagnosis Date Noted  . AKI (acute kidney injury) ([place]) 09/14/2019  . GERD (gastroesophageal reflux disease) 09/14/2019  . Spinal stenosis in cervical region 09/14/2019  . Weakness 09/14/2019  . Numbness and tingling 09/14/2019  . Cervical spinal stenosis 09/14/2019  . Lumbar stenosis 08/24/2019  . Obesity, morbid, BMI 40.0-49.9 ([place]) 04/19/2018  . Cataract   . Fibroid   . Endometriosis   . DM (diabetes mellitus) type II controlled with renal manifestation ([place]) 06/17/2010  . Dyslipidemia 06/17/2010  . Microcytic anemia  06/17/2010  . Essential hypertension 06/17/2010  . Allergic rhinitis 06/17/2010  . Osteoarthritis 06/17/2010  . LOW BACK PAIN 06/17/2010  . OSTEOPENIA 06/17/2010  . BREAST CANCER, HX OF 06/17/2010    Past Surgical History:  Procedure Laterality Date  . ABDOMINAL HYSTERECTOMY  1988   TAH,LSO  . BREAST LUMPECTOMY  2007   LUMPECTOMY FOLLOWED BY RADIATION  . OOPHORECTOMY  1988   TAH,LSO  . TUBAL LIGATION       OB History    Gravida  3   Para  3   Term  3   Preterm      AB  0   Living  2     SAB      TAB      Ectopic      Multiple      Live Births              Family History  Problem Relation Age of Onset  . Diabetes Mother   . Hypertension Sister   . Asthma Father   . Diabetes Brother     Social History   Tobacco Use  . Smoking status: Never Smoker  . Smokeless tobacco: Never Used  Substance Use Topics  . Alcohol use: No  . Drug use: No    Home Medications Prior to Admission medications   Medication Sig Start Date End Date Taking? Authorizing Provider  acetaminophen (TYLENOL) 500 MG tablet Take  500 mg by mouth every 6 (six) hours as needed for mild pain.   Yes [provider]  amLODipine-benazepril (LOTREL) 5-40 MG capsule TAKE 1 CAPSULE BY MOUTH EVERY DAY Patient taking differently: Take 1 capsule by mouth daily.  05/30/19  Yes Isaac Bliss, Rayford Halsted, MD  Ascorbic Acid (VITAMIN C) 1000 MG tablet Take 1,000 mg by mouth 2 (two) times daily as needed.    Yes [provider]  Azelastine-Fluticasone 137-50 MCG/ACT SUSP Place 1 spray into both nostrils daily. Patient taking differently: Place 1 spray into both nostrils daily as needed (rhinitis).  09/25/15  Yes Marletta Lor, MD  Cholecalciferol (VITAMIN D) 2000 UNITS CAPS Take 2 capsules by mouth daily.    Yes [provider]  cyanocobalamin 2000 MCG tablet Take 2,000 mcg by mouth daily.     Yes [provider]  cyclobenzaprine (FLEXERIL) 5 MG tablet  Take 1 tablet (5 mg total) by mouth at bedtime. Patient taking differently: Take 5 mg by mouth at bedtime as needed for muscle spasms.  12/23/16  Yes Marletta Lor, MD  dexamethasone (DECADRON) 4 MG tablet Take 1 tablet (4 mg total) by mouth 2 (two) times daily with a meal. 09/11/19  Yes Plunkett, Loree Fee, MD  fexofenadine (ALLEGRA) 180 MG tablet Take 90 mg by mouth daily as needed for allergies.    Yes [provider]  glipiZIDE (GLUCOTROL XL) 5 MG 24 hr tablet TAKE 1 TABLET BY MOUTH EVERY DAY Patient taking differently: Take 5 mg by mouth daily.  06/07/19  Yes Isaac Bliss, Rayford Halsted, MD  hydrochlorothiazide (HYDRODIURIL) 25 MG tablet Take 1 tablet (25 mg total) by mouth daily. 06/09/19  Yes Isaac Bliss, Rayford Halsted, MD  iron polysaccharides (FERREX 150) 150 MG capsule Take 1 capsule (150 mg total) by mouth daily. 11/02/18  Yes Isaac Bliss, Rayford Halsted, MD  Magnesium 250 MG TABS Take 1 tablet by mouth as needed.    Yes [provider]  metFORMIN (GLUCOPHAGE XR) 500 MG 24 hr tablet Take 1 tablet (500 mg total) by mouth 2 (two) times daily. 08/16/19  Yes Burchette, Alinda Sierras, MD  metoprolol succinate (TOPROL-XL) 50 MG 24 hr tablet TAKE 1 TABLET BY MOUTH EVERY MORNING. TAKE WITH OR IMMEDIATELY FOLLOWING A MEAL. Patient taking differently: Take 50 mg by mouth daily.  03/18/19  Yes Isaac Bliss, Rayford Halsted, MD  omeprazole (PRILOSEC) 40 MG capsule Take 40 mg by mouth at bedtime. 08/03/19  Yes [provider]  pioglitazone (ACTOS) 45 MG tablet TAKE 1 TABLET BY MOUTH EVERY DAY Patient taking differently: Take 45 mg by mouth daily.  05/27/19  Yes Isaac Bliss, Rayford Halsted, MD  pravastatin (PRAVACHOL) 40 MG tablet Take 1 tablet (40 mg total) by mouth daily. 02/25/18  Yes Marletta Lor, MD  prednisoLONE acetate (PRED FORTE) 1 % ophthalmic suspension Place 1 drop into both eyes See admin instructions. Place 1 drop into left eye four times daily Place 1 drop into right eye  twice daily 09/14/19  Yes [provider]  Probiotic Product (PROBIOTIC-10 PO) Take 1 capsule by mouth daily.    Yes [provider]  sitaGLIPtin (JANUVIA) 100 MG tablet Take 1 tablet (100 mg total) by mouth daily. 06/02/19  Yes Isaac Bliss, Rayford Halsted, MD  triamcinolone (NASACORT ALLERGY 24HR) 55 MCG/ACT AERO nasal inhaler Place 2 sprays into the nose daily as needed (allergies).    Yes [provider]  glucose blood (ONETOUCH VERIO) test strip TEST TWICE A  DAY.  Dx E11.9 10/28/18   Isaac Bliss, Rayford Halsted, MD  Lancets 30G MISC USE TWICE DAILY AS DIRECTED FOR GLUCOSE TESTING AND MONITORING 01/19/14   Marletta Lor, MD  ONE [facility] LANCETS MISC Test twice daily. 08/01/14   Marletta Lor, MD    Allergies    Dust mite extract  Review of Systems   Review of Systems  Constitutional: Negative for appetite change.  Respiratory: Negative for shortness of breath.   Gastrointestinal: Negative for abdominal pain.  Musculoskeletal: Positive for back pain and neck pain.  Skin: Negative for rash.  Neurological: Positive for weakness. Negative for numbness.  Psychiatric/Behavioral: Negative for confusion.    Physical Exam Updated Vital Signs BP (!) 145/75 (BP Location: Right Arm)   Pulse 90   Temp (!) 97.5 F (36.4 C) (Oral)   Resp 16   SpO2 100%   Physical Exam Vitals reviewed.  HENT:     Head: Normocephalic.  Cardiovascular:     Rate and Rhythm: Normal rate.  Abdominal:     Tenderness: There is no abdominal tenderness.  Musculoskeletal:     Cervical back: Neck supple.     Right lower leg: Edema present.     Left lower leg: Edema present.  Skin:    General: Skin is warm.     Capillary Refill: Capillary refill takes less than 2 seconds.  Neurological:     Mental Status: She is alert.     Comments: Awake and appropriate.  Some difficulty moving bilateral hands.  Appears to have good grip strength however.  Good flexion in right foot but less  plantar flexion in the left.     ED Results / Procedures / Treatments   Labs (all labs ordered are listed, but only abnormal results are displayed) Labs Reviewed  CBC - Abnormal; Notable for the following components:      Result Value   Hemoglobin 11.6 (*)    HCT 34.8 (*)    MCV 78.9 (*)    RDW 16.2 (*)    All other components within normal limits  COMPREHENSIVE METABOLIC PANEL - Abnormal; Notable for the following components:   Glucose, Bld 247 (*)    BUN 65 (*)    Creatinine, Ser 1.78 (*)    AST 14 (*)    GFR calc non Af Amer 27 (*)    GFR calc Af Amer 31 (*)    All other components within normal limits  SARS CORONAVIRUS 2 (TAT 6-24 HRS)  MAGNESIUM  IRON AND TIBC  FERRITIN    EKG None  Radiology No results found.  Procedures Procedures (including critical care time)  Medications Ordered in ED Medications  0.9 %  sodium chloride infusion (has no administration in time range)  acetaminophen (TYLENOL) tablet 650 mg (has no administration in time range)    Or  acetaminophen (TYLENOL) suppository 650 mg (has no administration in time range)  ondansetron (ZOFRAN) tablet 4 mg (has no administration in time range)    Or  ondansetron (ZOFRAN) injection 4 mg (has no administration in time range)  insulin aspart (novoLOG) injection 0-15 Units (has no administration in time range)  insulin aspart (novoLOG) injection 0-5 Units (has no administration in time range)    ED Course  I have reviewed the triage vital signs and the nursing notes.  Pertinent labs & imaging results that were available during my care of the patient were reviewed by me and considered in my medical decision making (see chart  for details).    MDM Rules/Calculators/A&P                      Patient represents with weakness in her hands and feet.  Pain.  Recently seen and diagnosed with cervical myelopathy.  Had outpatient neurosurgery follow-up plan, however is worsening states she is not managing  well at home.  More difficulty walking.  Discussed with Dr. Venetia Constable.  Thinks patient will likely be able to be operated on in the next day or 2 either by him or Dr. Christella Noa.  Feels the patient would benefit for admission to the hospital since she is doing worse at home.  Does have a mild acute kidney injury.  With all the comorbidities I feels the patient may benefit from [place] to the internal medicine service, and I will discuss with them. Final Clinical Impression(s) / ED Diagnoses Final diagnoses:  Cervical myelopathy ([place])  AKI (acute kidney injury) [facility])    Rx / [place] Orders ED Discharge Orders    None       Davonna Belling, MD 09/14/19 1520

## 2019-09-14 NOTE — ED Triage Notes (Signed)
Pt here for evaluation of continuing bilateral hand and foot pain. Sts was seen here on 3/13 and dx with arthritis. F/u appointment tomorrow but presents wanting to go ahead and check in and "get prepared" for the surgery today because it is hard for her to walk.

## 2019-09-14 NOTE — H&P (Addendum)
History and Physical    Doris Jackson [id number] DOB: 05-11-1941 DOA: 09/14/2019  PCP: Caren Macadam, MD  Patient coming from: Home  I have personally briefly reviewed patient's old medical records in [place]  Chief Complaint: Worsening numbness tingling in both hands and feet  HPI: MAERENE FOUGERE is a 79 y.o. female with medical history significant of hypertension, hyperlipidemia, diabetes mellitus, seasonal allergies, left eye corneal transplant, microcytic anemia, presents to emergency department due to worsening of numbness tingling sensation in both hands and feet.  Patient tells me that she was seen in the ER 4 days ago with similar symptoms.  MRI brain and cervical spine was obtained which showed cord compression due to severe spinal stenosis.  She was advised for admission however patient refused at that time and was discharged home on steroids as per neurosurgery Dr. Christella Noa recommendation and was recommended to follow-up with neurosurgery outpatient.  Patient tells me that she has worsening of numbness tingling sensation in both hands and feet and has fear to fall on the ground due to numbness/weakness in her feet.  She denies urinary or bowel incontinence, fever, chills, headache, blurry vision, chest pain, shortness of breath, nausea or vomiting.  She lives with her husband and denies smoking, alcohol, recent drug use.  ED Course: Upon arrival: Patient's vital signs stable, CBC shows microcytic anemia.  BMP shows worsening kidney function.  COVID-19 pending.  EDP consulted neurosurgery who recommended admission for possible procedure tomorrow.  Review of Systems: As per HPI otherwise negative.    Past Medical History:  Diagnosis Date  . ALLERGIC RHINITIS 06/17/2010  . ANEMIA-NOS 06/17/2010  . BREAST CANCER, HX OF 06/17/2010  . Cancer [facility]) 2008   right breast  . Cataract   . COPD 06/17/2010  . DIABETES MELLITUS, TYPE II 06/17/2010  . Endometriosis   .  Fibroid    FIBROID  . HYPERLIPIDEMIA 06/17/2010  . HYPERTENSION 06/17/2010  . Leg swelling   . LOW BACK PAIN 06/17/2010  . OSTEOARTHRITIS 06/17/2010  . OSTEOPENIA 06/17/2010  . Weakness     Past Surgical History:  Procedure Laterality Date  . ABDOMINAL HYSTERECTOMY  1988   TAH,LSO  . BREAST LUMPECTOMY  2007   LUMPECTOMY FOLLOWED BY RADIATION  . OOPHORECTOMY  1988   TAH,LSO  . TUBAL LIGATION       reports that she has never smoked. She has never used smokeless tobacco. She reports that she does not drink alcohol or use drugs.  No Known Allergies  Family History  Problem Relation Age of Onset  . Diabetes Mother   . Hypertension Sister   . Asthma Father   . Diabetes Brother     Prior to Admission medications   Medication Sig Start Date End Date Taking? Authorizing Provider  amLODipine-benazepril (LOTREL) 5-40 MG capsule TAKE 1 CAPSULE BY MOUTH EVERY DAY 05/30/19   Isaac Bliss, Rayford Halsted, MD  Ascorbic Acid (VITAMIN C) 1000 MG tablet Take 1,000 mg by mouth 2 (two) times daily.    [provider]  Azelastine-Fluticasone 137-50 MCG/ACT SUSP Place 1 spray into both nostrils daily. 09/25/15   Marletta Lor, MD  Cholecalciferol (VITAMIN D) 2000 UNITS CAPS Take 1 capsule by mouth daily. Taking 2 caps daily 4,000    [provider]  cyanocobalamin 2000 MCG tablet Take 2,000 mcg by mouth daily.      [provider]  cyclobenzaprine (FLEXERIL) 5 MG tablet Take 1 tablet (5 mg total) by mouth  at bedtime. 12/23/16   Marletta Lor, MD  dexamethasone (DECADRON) 4 MG tablet Take 1 tablet (4 mg total) by mouth 2 (two) times daily with a meal. 09/11/19   Blanchie Dessert, MD  famotidine (PEPCID) 20 MG tablet Take 20 mg by mouth 2 (two) times daily.    [provider]  fexofenadine (ALLEGRA) 180 MG tablet Take 180 mg by mouth daily as needed.     [provider]  furosemide (LASIX) 20 MG tablet Take 1 tablet (20 mg total) by mouth  daily as needed. 02/25/18   Marletta Lor, MD  glipiZIDE (GLUCOTROL XL) 5 MG 24 hr tablet TAKE 1 TABLET BY MOUTH EVERY DAY 06/07/19   Isaac Bliss, Rayford Halsted, MD  glucose blood (ONETOUCH VERIO) test strip TEST TWICE A DAY.  Dx E11.9 10/28/18   Isaac Bliss, Rayford Halsted, MD  hydrochlorothiazide (HYDRODIURIL) 25 MG tablet Take 1 tablet (25 mg total) by mouth daily. 06/09/19   Isaac Bliss, Rayford Halsted, MD  HYDROcodone-acetaminophen (NORCO/VICODIN) 5-325 MG tablet Take 1 tablet by mouth 2 (two) times daily as needed for moderate pain. 11/11/18   Isaac Bliss, Rayford Halsted, MD  HYDROcodone-acetaminophen (NORCO/VICODIN) 5-325 MG tablet Take 1 tablet by mouth 2 (two) times daily as needed. 11/11/18   Isaac Bliss, Rayford Halsted, MD  HYDROcodone-acetaminophen (NORCO/VICODIN) 5-325 MG tablet Take 1 tablet by mouth 2 (two) times daily as needed for moderate pain. 11/11/18   Isaac Bliss, Rayford Halsted, MD  iron polysaccharides (FERREX 150) 150 MG capsule Take 1 capsule (150 mg total) by mouth daily. 11/02/18   Isaac Bliss, Rayford Halsted, MD  Lancets 30G MISC USE TWICE DAILY AS DIRECTED FOR GLUCOSE TESTING AND MONITORING 01/19/14   Marletta Lor, MD  Magnesium 250 MG TABS Take 1 tablet by mouth as needed.     [provider]  metFORMIN (GLUCOPHAGE XR) 500 MG 24 hr tablet Take 1 tablet (500 mg total) by mouth 2 (two) times daily. 08/16/19   Burchette, Alinda Sierras, MD  metoprolol succinate (TOPROL-XL) 50 MG 24 hr tablet TAKE 1 TABLET BY MOUTH EVERY MORNING. TAKE WITH OR IMMEDIATELY FOLLOWING A MEAL. 03/18/19   Isaac Bliss, Rayford Halsted, MD  Multiple Vitamin (MULTIVITAMIN) tablet Take 1 tablet by mouth daily.      [provider]  omeprazole (PRILOSEC) 40 MG capsule Take 40 mg by mouth at bedtime. 08/03/19   [provider]  ONE TOUCH LANCETS MISC Test twice daily. 08/01/14   Marletta Lor, MD  pioglitazone (ACTOS) 45 MG tablet TAKE 1 TABLET BY MOUTH EVERY DAY 05/27/19   Isaac Bliss, Rayford Halsted, MD  pravastatin (PRAVACHOL) 40 MG tablet Take 1 tablet (40 mg total) by mouth daily. 02/25/18   Marletta Lor, MD  Probiotic Product (PROBIOTIC-10 PO) Take by mouth.    [provider]  saccharomyces boulardii (FLORASTOR) 250 MG capsule Take 1 capsule (250 mg total) by mouth 2 (two) times daily. 12/24/18   Isaac Bliss, Rayford Halsted, MD  sitaGLIPtin (JANUVIA) 100 MG tablet Take 1 tablet (100 mg total) by mouth daily. 06/02/19   Isaac Bliss, Rayford Halsted, MD  triamcinolone (NASACORT ALLERGY 24HR) 55 MCG/ACT AERO nasal inhaler Place 2 sprays into the nose daily.    [provider]  triamcinolone cream (KENALOG) 0.1 % Apply 1 application topically 2 (two) times daily. 05/08/15   Dorothyann Peng, NP    Physical Exam: Vitals:   09/14/19 1126  BP: (!) 145/75  Pulse: 90  Resp:  16  Temp: (!) 97.5 F (36.4 C)  TempSrc: Oral  SpO2: 100%    Constitutional: NAD, calm, comfortable, morbidly obese, communicating well. Eyes: PERRL, lids and conjunctivae normal ENMT: Mucous membranes are moist. Posterior pharynx clear of any exudate or lesions.Normal dentition.  Neck: normal, supple, no masses, no thyromegaly Respiratory: clear to auscultation bilaterally, no wheezing, no crackles. Normal respiratory effort. No accessory muscle use.  Cardiovascular: Regular rate and rhythm, no murmurs / rubs / gallops.  Nonpitting bilateral lower extremity edema positive. 2+ pedal pulses. No carotid bruits.  Abdomen: no tenderness, no masses palpated. No hepatosplenomegaly. Bowel sounds positive.  Musculoskeletal: no clubbing / cyanosis. No joint deformity upper and lower extremities. Good ROM, no contractures. Normal muscle tone.  Skin: no rashes, lesions, ulcers. No induration Neurologic: CN 2-12 grossly intact. Sensation intact, DTR normal. Strength 3 out of 5 in all 4.  Psychiatric: Normal judgment and insight. Alert and oriented x 3. Normal mood.    Labs on Admission: I  have personally reviewed following labs and imaging studies  CBC: Recent Labs  Lab 09/10/19 1529 09/14/19 1307  WBC 6.9 10.0  HGB 11.8* 11.6*  HCT 35.5* 34.8*  MCV 79.8* 78.9*  PLT 164 [id number]   Basic Metabolic Panel: Recent Labs  Lab 09/10/19 1529 09/14/19 1307  NA 139 138  K 3.4* 4.1  CL 99 100  CO2 26 23  GLUCOSE 145* 247*  BUN 24* 65*  CREATININE 1.35* 1.78*  CALCIUM 9.4 9.1   GFR: Estimated Creatinine Clearance: 29.2 mL/min (A) (by C-G formula based on SCr of 1.78 mg/dL (H)). Liver Function Tests: Recent Labs  Lab 09/14/19 1307  AST 14*  ALT 14  ALKPHOS 62  BILITOT 0.5  PROT 7.4  ALBUMIN 3.5   No results for input(s): LIPASE, AMYLASE in the last 168 hours. No results for input(s): AMMONIA in the last 168 hours. Coagulation Profile: No results for input(s): INR, PROTIME in the last 168 hours. Cardiac Enzymes: No results for input(s): CKTOTAL, CKMB, CKMBINDEX, TROPONINI in the last 168 hours. BNP (last 3 results) No results for input(s): PROBNP in the last 8760 hours. HbA1C: No results for input(s): HGBA1C in the last 72 hours. CBG: Recent Labs  Lab 09/10/19 1921 09/10/19 2258  GLUCAP 137* 105*   Lipid Profile: No results for input(s): CHOL, HDL, LDLCALC, TRIG, CHOLHDL, LDLDIRECT in the last 72 hours. Thyroid Function Tests: No results for input(s): TSH, T4TOTAL, FREET4, T3FREE, THYROIDAB in the last 72 hours. Anemia Panel: No results for input(s): VITAMINB12, FOLATE, FERRITIN, TIBC, IRON, RETICCTPCT in the last 72 hours. Urine analysis:    Component Value Date/Time   COLORURINE YELLOW 09/10/2019 1715   APPEARANCEUR CLEAR 09/10/2019 1715   LABSPEC 1.010 09/10/2019 1715   PHURINE 5.0 09/10/2019 1715   [place] 09/10/2019 1715   HGBUR NEGATIVE 09/10/2019 [place] 09/10/2019 1715   BILIRUBINUR negative 09/11/2016 1139   KETONESUR 5 (A) 09/10/2019 1715   PROTEINUR NEGATIVE 09/10/2019 1715   UROBILINOGEN 0.2 09/11/2016  1139   NITRITE NEGATIVE 09/10/2019 1715   LEUKOCYTESUR NEGATIVE 09/10/2019 1715    Radiological Exams on Admission: No results found.   Assessment/Plan Principal Problem:   Spinal stenosis in cervical region Active Problems:   Dyslipidemia   Microcytic anemia   Essential hypertension   AKI (acute kidney injury) ([place])   GERD (gastroesophageal reflux disease)   Numbness and tingling   Spinal stenosis in cervical region: -Reviewed MRI brain and cervical spine which showed Severe  spinal canal stenosis at C3-C4 with compression of the spinal cord and associated mild signal change. Moderate spinal canal stenosis at C4-C5 with mild mass effect on the left hemicord & Multilevel moderate to severe neural foraminal stenosis. -Patient presented with worsening of numbness tingling sensation in hands and feet even with Decadron 4 mg p.o. twice daily. -Admit patient on the floor.  Continue p.o. steroids. -EDP consulted neurosurgery-await recommendation -We will keep her n.p.o. after midnight. -Consulted PT/OT  AKI: -BUN: 65, creatinine: 1.78, GFR: 31 (baseline creatinine: 1.27, BUN: 34) -Avoid nephrotoxic medication such as Metformin and Januvia -Continue IV fluids and monitor kidney function closely.  Hypertension: Well-controlled -Continue amlodipine and HCTZ  Hyperlipidemia: Continue statin  Diabetes mellitus: Check A1c -Patient takes Metformin, glipizide, Actos and Januvia at home -Continue glipizide and Actos and hold Metformin and Januvia due to worsening kidney function.  Started patient on sliding scale insulin and monitor blood sugar closely.  GERD: Continue PPI  Microcytic anemia: No history of melena -H&H is stable. -Continue Ferrex 150 mg daily -Check iron studies.  Seasonal allergies: Continue home meds/inhalers  DVT prophylaxis: TED/SCD Code Status: Full code  family Communication: None present at bedside.  Plan of care discussed with patient in length and she  verbalized understanding and agreed with it. Disposition Plan: TBD Consults called: Neurosurgery by EDP Admission status: Inpatient  Mckinley Jewel MD [facility] Pager [phone]  If 7PM-7AM, please contact night-coverage [email or web address] Password [facility]  09/14/2019, 2:23 PM

## 2019-09-14 NOTE — Telephone Encounter (Signed)
Noted  

## 2019-09-14 NOTE — Telephone Encounter (Signed)
Pt called to check and see if Dr. Ethlyn Gallery had got her messages also wanted to let her know that she is not feeling or sleeping well so she may be going to ER.

## 2019-09-14 NOTE — ED Notes (Signed)
ED TO INPATIENT HANDOFF REPORT  ED Nurse Name and Phone #: Joellen Jersey [phone]  S Name/Age/Gender Doris Jackson 79 y.o. female Room/Bed: H020C/H020C  Code Status   Code Status: Full Code  Home/SNF/Other Home Patient oriented to: self, place, time and situation Is this baseline? Yes   Triage Complete: Triage complete  Chief Complaint Cervical spinal stenosis [M48.02]  Triage Note Pt here for evaluation of continuing bilateral hand and foot pain. Sts was seen here on 3/13 and dx with arthritis. F/u appointment tomorrow but presents wanting to go ahead and check in and "get prepared" for the surgery today because it is hard for her to walk.     Allergies Allergies  Allergen Reactions  . Dust Mite Extract Itching and Cough    Level of Care/Admitting Diagnosis ED Disposition    ED Disposition Condition [facility]: [place] [[id number]]  Level of Care: Telemetry Medical [104]  May admit patient to [facility] or [facility] if equivalent level of care is available:: Yes  Covid Evaluation: Asymptomatic Screening Protocol (No Symptoms)  Diagnosis: Cervical spinal stenosis [id number]  Admitting Physician: Mckinley Jewel [id number]  Attending Physician: Mckinley Jewel [phone]  Estimated length of stay: past midnight tomorrow  Certification:: I certify this patient will need inpatient services for at least 2 midnights       B Medical/Surgery History Past Medical History:  Diagnosis Date  . ALLERGIC RHINITIS 06/17/2010  . ANEMIA-NOS 06/17/2010  . BREAST CANCER, HX OF 06/17/2010  . Cancer [facility]) 2008   right breast  . Cataract   . COPD 06/17/2010  . DIABETES MELLITUS, TYPE II 06/17/2010  . Endometriosis   . Fibroid    FIBROID  . HYPERLIPIDEMIA 06/17/2010  . HYPERTENSION 06/17/2010  . Leg swelling   . LOW BACK PAIN 06/17/2010  . OSTEOARTHRITIS 06/17/2010  . OSTEOPENIA 06/17/2010  . Weakness    Past Surgical History:  Procedure  Laterality Date  . ABDOMINAL HYSTERECTOMY  1988   TAH,LSO  . BREAST LUMPECTOMY  2007   LUMPECTOMY FOLLOWED BY RADIATION  . OOPHORECTOMY  1988   TAH,LSO  . TUBAL LIGATION       A IV Location/Drains/Wounds Patient Lines/Drains/Airways Status   Active Line/Drains/Airways    Name:   Placement date:   Placement time:   Site:   Days:   Peripheral IV 09/14/19 Left Antecubital   09/14/19    1514    Antecubital   less than 1          Intake/Output Last 24 hours No intake or output data in the 24 hours ending 09/14/19 1555  Labs/Imaging Results for orders placed or performed during the hospital encounter of 09/14/19 (from the past 48 hour(s))  CBC     Status: Abnormal   Collection Time: 09/14/19  1:07 PM  Result Value Ref Range   WBC 10.0 4.0 - 10.5 K/uL   RBC 4.41 3.87 - 5.11 MIL/uL   Hemoglobin 11.6 (L) 12.0 - 15.0 g/dL   HCT 34.8 (L) 36.0 - 46.0 %   MCV 78.9 (L) 80.0 - 100.0 fL   MCH 26.3 26.0 - 34.0 pg   MCHC 33.3 30.0 - 36.0 g/dL   RDW 16.2 (H) 11.5 - 15.5 %   Platelets 167 150 - 400 K/uL   nRBC 0.0 0.0 - 0.2 %    Comment: Performed at [facility], [address] [place],  [address]  Comprehensive metabolic panel  Status: Abnormal   Collection Time: 09/14/19  1:07 PM  Result Value Ref Range   Sodium 138 135 - 145 mmol/L   Potassium 4.1 3.5 - 5.1 mmol/L   Chloride 100 98 - 111 mmol/L   CO2 23 22 - 32 mmol/L   Glucose, Bld 247 (H) 70 - 99 mg/dL    Comment: Glucose reference range applies only to samples taken after fasting for at least 8 hours.   BUN 65 (H) 8 - 23 mg/dL   Creatinine, Ser 1.78 (H) 0.44 - 1.00 mg/dL   Calcium 9.1 8.9 - 10.3 mg/dL   Total Protein 7.4 6.5 - 8.1 g/dL   Albumin 3.5 3.5 - 5.0 g/dL   AST 14 (L) 15 - 41 U/L   ALT 14 0 - 44 U/L   Alkaline Phosphatase 62 38 - 126 U/L   Total Bilirubin 0.5 0.3 - 1.2 mg/dL   GFR calc non Af Amer 27 (L) >60 mL/min   GFR calc Af Amer 31 (L) >60 mL/min   Anion gap 15 5 - 15    Comment:  Performed at [facility] [address], [place], [place] [address]  Magnesium     Status: None   Collection Time: 09/14/19  2:22 PM  Result Value Ref Range   Magnesium 1.8 1.7 - 2.4 mg/dL    Comment: Performed at [facility], [place] [address], [place], [place] [address]   No results found.  Pending Labs Unresulted Labs (From admission, onward)    Start     Ordered   09/15/19 [id number]  Basic metabolic panel  Tomorrow morning,   R     09/14/19 1423   09/15/19 0500  CBC  Tomorrow morning,   R     09/14/19 1423   09/14/19 1421  Iron and TIBC  Add-on,   AD     09/14/19 1420   09/14/19 1421  Ferritin  Add-on,   AD     09/14/19 1420   09/14/19 1400  SARS CORONAVIRUS 2 (TAT 6-24 HRS) Nasopharyngeal Nasopharyngeal Swab  (Tier 3 (TAT 6-24 hrs))  Once,   STAT    Question Answer Comment  Is this test for diagnosis or screening Screening   Symptomatic for COVID-19 as defined by CDC No   Hospitalized for COVID-19 No   Admitted to ICU for COVID-19 No   Previously tested for COVID-19 No   Resident in a congregate (group) care setting No   Employed in healthcare setting No   Pregnant No      09/14/19 1359          Vitals/Pain Today's Vitals   09/14/19 1126 09/14/19 1144  BP: (!) 145/75   Pulse: 90   Resp: 16   Temp: (!) 97.5 F (36.4 C)   TempSrc: Oral   SpO2: 100%   PainSc:  7     Isolation Precautions No active isolations  Medications Medications  0.9 %  sodium chloride infusion (has no administration in time range)  acetaminophen (TYLENOL) tablet 650 mg (has no administration in time range)    Or  acetaminophen (TYLENOL) suppository 650 mg (has no administration in time range)  ondansetron (ZOFRAN) tablet 4 mg (has no administration in time range)    Or  ondansetron (ZOFRAN) injection 4 mg (has no administration in time range)  insulin aspart (novoLOG) injection 0-15 Units (has no administration in time range)  insulin aspart (novoLOG) injection 0-5 Units  (has no administration in  time range)    Mobility walks with device Moderate fall risk     R Recommendations: See Admitting Provider Note  Report given to:   Additional Notes:

## 2019-09-14 NOTE — Telephone Encounter (Signed)
Noted. I will follow along with hospital notes. Keep me posted if she needs anything after this visit, but I suspect they will keep her.

## 2019-09-14 NOTE — Consult Note (Signed)
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 female.  HPI: whom I was called about on Saturday the 13th. We spoke through the ED physician, and I did offer surgery comprising an ACDF to decompress the spinal cord. She, understandably wanted to speak with her husband before embarking upon surgery. Doris Jackson has an appointment with me tomorrow, but her condition continued to deteriorate while at home and she sought care in the Emergency Dept. today. Doris Jackson has been admitted to the Medical service. Doris Jackson came to the ED on Saturday for worsening weakness and clumsiness on her left side, and increasing difficulties walking. Her condition declined over the last 3 weeks fairly rapidly. She describes neck pain, bilateral upper extremity pain, and being off balance. 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 Doris Jackson was found to have problems with coordination when examined in the ED on the 13th, and today. She has been on decadron 4mg  bid, without improvement.   Past Medical History:  Diagnosis Date  . ALLERGIC RHINITIS 06/17/2010  . ANEMIA-NOS 06/17/2010  . BREAST CANCER, HX OF 06/17/2010  . Cancer [facility]) 2008   right breast  . Cataract   . COPD 06/17/2010  . DIABETES MELLITUS, TYPE II 06/17/2010  . Endometriosis   . Fibroid    FIBROID  . HYPERLIPIDEMIA 06/17/2010  . HYPERTENSION 06/17/2010  . Leg swelling   . LOW BACK PAIN 06/17/2010  . OSTEOARTHRITIS 06/17/2010  . OSTEOPENIA 06/17/2010  . Weakness     Past Surgical History:  Procedure Laterality Date  . ABDOMINAL HYSTERECTOMY  1988   TAH,LSO  . BREAST LUMPECTOMY  2007   LUMPECTOMY FOLLOWED BY RADIATION  . OOPHORECTOMY  1988   TAH,LSO  . TUBAL LIGATION      Family History  Problem Relation Age of Onset  . Diabetes Mother   . Hypertension Sister   . Asthma Father   . Diabetes Brother     Social History:  reports that  she has never smoked. She has never used smokeless tobacco. She reports that she does not drink alcohol or use drugs.  Allergies:  Allergies  Allergen Reactions  . Dust Mite Extract Itching and Cough    Medications: I have reviewed the patient's current medications.  Results for orders placed or performed during the hospital encounter of 09/14/19 (from the past 48 hour(s))  CBC     Status: Abnormal   Collection Time: 09/14/19  1:07 PM  Result Value Ref Range   WBC 10.0 4.0 - 10.5 K/uL   RBC 4.41 3.87 - 5.11 MIL/uL   Hemoglobin 11.6 (L) 12.0 - 15.0 g/dL   HCT 34.8 (L) 36.0 - 46.0 %   MCV 78.9 (L) 80.0 - 100.0 fL   MCH 26.3 26.0 - 34.0 pg   MCHC 33.3 30.0 - 36.0 g/dL   RDW 16.2 (H) 11.5 - 15.5 %   Platelets 167 150 - 400 K/uL   nRBC 0.0 0.0 - 0.2 %    Comment: Performed at [facility], [place] [address] [place], [place] [address]  Comprehensive metabolic panel     Status: Abnormal   Collection Time: 09/14/19  1:07 PM  Result Value Ref Range   Sodium 138 135 - 145 mmol/L   Potassium 4.1 3.5 - 5.1 mmol/L   Chloride 100 98 - 111 mmol/L   CO2 23 22 - 32 mmol/L  Glucose, Bld 247 (H) 70 - 99 mg/dL    Comment: Glucose reference range applies only to samples taken after fasting for at least 8 hours.   BUN 65 (H) 8 - 23 mg/dL   Creatinine, Ser 1.78 (H) 0.44 - 1.00 mg/dL   Calcium 9.1 8.9 - 10.3 mg/dL   Total Protein 7.4 6.5 - 8.1 g/dL   Albumin 3.5 3.5 - 5.0 g/dL   AST 14 (L) 15 - 41 U/L   ALT 14 0 - 44 U/L   Alkaline Phosphatase 62 38 - 126 U/L   Total Bilirubin 0.5 0.3 - 1.2 mg/dL   GFR calc non Af Amer 27 (L) >60 mL/min   GFR calc Af Amer 31 (L) >60 mL/min   Anion gap 15 5 - 15    Comment: Performed at [facility] [address], [place], [place] [address]  SARS CORONAVIRUS 2 (TAT 6-24 HRS) Nasopharyngeal Nasopharyngeal Swab     Status: None   Collection Time: 09/14/19  2:13 PM   Specimen: Nasopharyngeal Swab  Result Value Ref Range   SARS Coronavirus 2  NEGATIVE NEGATIVE    Comment: (NOTE) SARS-CoV-2 target nucleic acids are NOT DETECTED. The SARS-CoV-2 RNA is generally detectable in upper and lower respiratory specimens during the acute phase of infection. Negative results do not preclude SARS-CoV-2 infection, do not rule out co-infections with other pathogens, and should not be used as the sole basis for treatment or other patient management decisions. Negative results must be combined with clinical observations, patient history, and epidemiological information. The expected result is Negative. Fact Sheet for Patients: SugarRoll.be Fact Sheet for Healthcare Providers: https://www.woods-mathews.com/ This test is not yet approved or cleared by the [place] FDA and  has been authorized for detection and/or diagnosis of SARS-CoV-2 by FDA under an Emergency Use Authorization (EUA). This EUA will remain  in effect (meaning this test can be used) for the duration of the COVID-19 declaration under Section 56 4(b)(1) of the Act, 21 U.S.C. section 360bbb-3(b)(1), unless the authorization is terminated or revoked sooner. Performed at [facility], [place] [address] [place], [place] [address]   Magnesium     Status: None   Collection Time: 09/14/19  2:22 PM  Result Value Ref Range   Magnesium 1.8 1.7 - 2.4 mg/dL    Comment: Performed at [facility], [place] [address], [place], [place] [address]  Glucose, capillary     Status: Abnormal   Collection Time: 09/14/19  4:50 PM  Result Value Ref Range   Glucose-Capillary 203 (H) 70 - 99 mg/dL    Comment: Glucose reference range applies only to samples taken after fasting for at least 8 hours.    No results found.  Review of Systems  Constitutional: Positive for fatigue.  HENT: Negative.   Eyes: Negative.   Respiratory: Negative.   Cardiovascular: Negative.   Endocrine: Negative.   Neurological: Positive for dizziness, weakness,  light-headedness and numbness.  Psychiatric/Behavioral: Negative.    Blood pressure (!) 145/84, pulse 93, temperature (!) 97.5 F (36.4 C), temperature source Oral, resp. rate 15, SpO2 100 %. Physical Exam  Constitutional: She is oriented to person, place, and time. She appears well-developed and well-nourished. She appears distressed.  HENT:  Head: Normocephalic and atraumatic.  Right Ear: External ear normal.  Left Ear: External ear normal.  Eyes: Pupils are equal, round, and reactive to light. Conjunctivae and EOM are normal.  Neck:  Painful with movement  Cardiovascular: Normal  rate and regular rhythm.  Respiratory: Effort normal and breath sounds normal.  GI: Soft.  Musculoskeletal:     Cervical back: Normal range of motion.  Neurological: She is alert and oriented to person, place, and time. A sensory deficit is present. No cranial nerve deficit. Coordination abnormal. She displays no Babinski's sign on the right side. She displays Babinski's sign on the left side.  Reflex Scores:      Tricep reflexes are 3+ on the right side and 3+ on the left side.      Bicep reflexes are 3+ on the right side and 3+ on the left side.      Brachioradialis reflexes are 3+ on the right side and 3+ on the left side.      Patellar reflexes are 3+ on the right side and 3+ on the left side.      Achilles reflexes are 3+ on the right side and 3+ on the left side. Bilateral hoffman's signs. Proprioception severely diminished on left side upper and lower extremities Very brisk reflexes throughout  Finger flexion spread with brachioradialis reflexes Poor fine motor movement, fingers re stiff    Assessment/Plan: Doris Jackson is a 79 y.o. female With severe cervical stenosis at C3/4, 4/5, and cord signal at C5 and C3 She is myelopathic on examination. I believe and have recommended an ACDF at C3/4,4/5, and tentatively at C5/6. Risks and benefits including but not limited to bleeding, infection,  hardware failure, fusion failure, stroke, paralysis, weakness in the extremities, hoarseness, no relief of symptoms, neurological decline, bowel and or bladder dysfunction, and other risks. I also explained that if she did nothing I have no reasonable expectation that her neurological status would improve, and most likely would be worse. She understands and wishes to proceed with the operative decompression and arthrodesis.Marland Kitchen Ashok Pall 09/14/2019, 7:21 PM

## 2019-09-14 NOTE — Telephone Encounter (Signed)
Patient called back and was informed of the message below regarding steroids.  Patient stated per the specialists recommendations, since she is not feeling well, she is headed back to the hospital.  Message sent to PCP.

## 2019-09-15 ENCOUNTER — Inpatient Hospital Stay (HOSPITAL_COMMUNITY): Payer: Medicare HMO | Admitting: Certified Registered Nurse Anesthetist

## 2019-09-15 ENCOUNTER — Inpatient Hospital Stay (HOSPITAL_COMMUNITY): Payer: Medicare HMO

## 2019-09-15 ENCOUNTER — Encounter (HOSPITAL_COMMUNITY)
Admission: EM | Disposition: A | Discharge status: Rehabilitation Facility | Payer: Self-pay | Source: Home / Self Care | Attending: Internal Medicine

## 2019-09-15 ENCOUNTER — Encounter (HOSPITAL_COMMUNITY): Payer: Self-pay | Admitting: Internal Medicine

## 2019-09-15 DIAGNOSIS — E1129 Type 2 diabetes mellitus with other diabetic kidney complication: Secondary | ICD-10-CM

## 2019-09-15 DIAGNOSIS — I10 Essential (primary) hypertension: Secondary | ICD-10-CM

## 2019-09-15 DIAGNOSIS — M4712 Other spondylosis with myelopathy, cervical region: Secondary | ICD-10-CM

## 2019-09-15 DIAGNOSIS — N179 Acute kidney failure, unspecified: Secondary | ICD-10-CM

## 2019-09-15 DIAGNOSIS — D509 Iron deficiency anemia, unspecified: Secondary | ICD-10-CM

## 2019-09-15 DIAGNOSIS — E785 Hyperlipidemia, unspecified: Secondary | ICD-10-CM

## 2019-09-15 DIAGNOSIS — K219 Gastro-esophageal reflux disease without esophagitis: Secondary | ICD-10-CM

## 2019-09-15 HISTORY — PX: ANTERIOR CERVICAL DECOMP/DISCECTOMY FUSION: SHX1161

## 2019-09-15 HISTORY — DX: Other spondylosis with myelopathy, cervical region: M47.12

## 2019-09-15 LAB — ABO/RH: ABO/RH(D): B POS

## 2019-09-15 LAB — CBC
HCT: 31.5 % — ABNORMAL LOW (ref 36.0–46.0)
Hemoglobin: 10.6 g/dL — ABNORMAL LOW (ref 12.0–15.0)
MCH: 26.8 pg (ref 26.0–34.0)
MCHC: 33.7 g/dL (ref 30.0–36.0)
MCV: 79.5 fL — ABNORMAL LOW (ref 80.0–100.0)
Platelets: 155 10*3/uL (ref 150–400)
RBC: 3.96 MIL/uL (ref 3.87–5.11)
RDW: 16.3 % — ABNORMAL HIGH (ref 11.5–15.5)
WBC: 9.7 10*3/uL (ref 4.0–10.5)
nRBC: 0 % (ref 0.0–0.2)

## 2019-09-15 LAB — TYPE AND SCREEN
ABO/RH(D): B POS
Antibody Screen: NEGATIVE

## 2019-09-15 LAB — BASIC METABOLIC PANEL
Anion gap: 12 (ref 5–15)
BUN: 50 mg/dL — ABNORMAL HIGH (ref 8–23)
CO2: 25 mmol/L (ref 22–32)
Calcium: 8.9 mg/dL (ref 8.9–10.3)
Chloride: 106 mmol/L (ref 98–111)
Creatinine, Ser: 1.51 mg/dL — ABNORMAL HIGH (ref 0.44–1.00)
GFR calc Af Amer: 38 mL/min — ABNORMAL LOW (ref >60)
GFR calc non Af Amer: 33 mL/min — ABNORMAL LOW (ref >60)
Glucose, Bld: 144 mg/dL — ABNORMAL HIGH (ref 70–99)
Potassium: 3.8 mmol/L (ref 3.5–5.1)
Sodium: 143 mmol/L (ref 135–145)

## 2019-09-15 LAB — PROTIME-INR
INR: 1 (ref 0.8–1.2)
Prothrombin Time: 13.1 seconds (ref 11.4–15.2)

## 2019-09-15 LAB — SURGICAL PCR SCREEN
MRSA, PCR: NEGATIVE
Staphylococcus aureus: NEGATIVE

## 2019-09-15 LAB — GLUCOSE, CAPILLARY
Glucose-Capillary: 136 mg/dL — ABNORMAL HIGH (ref 70–99)
Glucose-Capillary: 76 mg/dL (ref 70–99)
Glucose-Capillary: 126 mg/dL — ABNORMAL HIGH (ref 70–99)
Glucose-Capillary: 157 mg/dL — ABNORMAL HIGH (ref 70–99)
Glucose-Capillary: 274 mg/dL — ABNORMAL HIGH (ref 70–99)
Glucose-Capillary: 257 mg/dL — ABNORMAL HIGH (ref 70–99)

## 2019-09-15 LAB — APTT: aPTT: 27 seconds (ref 24–36)

## 2019-09-15 SURGERY — ANTERIOR CERVICAL DECOMPRESSION/DISCECTOMY FUSION 3 LEVELS
Anesthesia: General | Site: Spine Cervical

## 2019-09-15 MED ORDER — PHENYLEPHRINE HCL-NACL 10-0.9 MG/250ML-% IV SOLN
INTRAVENOUS | Status: DC | PRN
  Administered 2019-09-15: 40 ug/min via INTRAVENOUS

## 2019-09-15 MED ORDER — THROMBIN 5000 UNITS EX SOLR
OROMUCOSAL | Status: DC | PRN
  Administered 2019-09-15: 5 mL via TOPICAL

## 2019-09-15 MED ORDER — SENNOSIDES-DOCUSATE SODIUM 8.6-50 MG PO TABS: 1.0000 | ORAL_TABLET | Freq: Every evening | ORAL | Status: DC | PRN

## 2019-09-15 MED ORDER — THROMBIN 20000 UNITS EX SOLR: CUTANEOUS | Status: DC | PRN

## 2019-09-15 MED ORDER — SENNA 8.6 MG PO TABS
1.0000 | ORAL_TABLET | Freq: Two times a day (BID) | ORAL | Status: DC
  Administered 2019-09-16 – 2019-09-21 (×7): 8.6 mg via ORAL
  Filled 2019-09-15 (×14): qty 1

## 2019-09-15 MED ORDER — SUGAMMADEX SODIUM 200 MG/2ML IV SOLN
INTRAVENOUS | Status: DC | PRN
  Administered 2019-09-15: 200 mg via INTRAVENOUS

## 2019-09-15 MED ORDER — THROMBIN 20000 UNITS EX SOLR
CUTANEOUS | Status: AC
  Filled 2019-09-15: qty 20000

## 2019-09-15 MED ORDER — ZOLPIDEM TARTRATE 5 MG PO TABS: 5.0000 mg | ORAL_TABLET | Freq: Every evening | ORAL | Status: DC | PRN

## 2019-09-15 MED ORDER — LIDOCAINE-EPINEPHRINE 0.5 %-1:200000 IJ SOLN
INTRAMUSCULAR | Status: AC
  Filled 2019-09-15: qty 1

## 2019-09-15 MED ORDER — PROPOFOL 10 MG/ML IV BOLUS
INTRAVENOUS | Status: DC | PRN
  Administered 2019-09-15: 80 mg via INTRAVENOUS

## 2019-09-15 MED ORDER — MIDAZOLAM HCL 2 MG/2ML IJ SOLN: 0.5000 mg | Freq: Once | INTRAMUSCULAR | Status: DC | PRN

## 2019-09-15 MED ORDER — FENTANYL CITRATE (PF) 250 MCG/5ML IJ SOLN
INTRAMUSCULAR | Status: DC | PRN
  Administered 2019-09-15: 125 ug via INTRAVENOUS
  Administered 2019-09-15: 75 ug via INTRAVENOUS
  Administered 2019-09-15: 50 ug via INTRAVENOUS

## 2019-09-15 MED ORDER — LIDOCAINE-EPINEPHRINE 0.5 %-1:200000 IJ SOLN
INTRAMUSCULAR | Status: DC | PRN
  Administered 2019-09-15: 4 mL

## 2019-09-15 MED ORDER — DEXAMETHASONE SODIUM PHOSPHATE 10 MG/ML IJ SOLN
INTRAMUSCULAR | Status: DC | PRN
  Administered 2019-09-15: 10 mg via INTRAVENOUS

## 2019-09-15 MED ORDER — BISACODYL 5 MG PO TBEC: 5.0000 mg | DELAYED_RELEASE_TABLET | Freq: Every day | ORAL | Status: DC | PRN

## 2019-09-15 MED ORDER — THROMBIN 5000 UNITS EX SOLR
CUTANEOUS | Status: AC
  Filled 2019-09-15: qty 5000

## 2019-09-15 MED ORDER — PHENYLEPHRINE 40 MCG/ML (10ML) SYRINGE FOR IV PUSH (FOR BLOOD PRESSURE SUPPORT)
PREFILLED_SYRINGE | INTRAVENOUS | Status: DC | PRN
  Administered 2019-09-15: 80 ug via INTRAVENOUS

## 2019-09-15 MED ORDER — HYDROCODONE-ACETAMINOPHEN 5-325 MG PO TABS
1.0000 | ORAL_TABLET | ORAL | Status: DC | PRN
  Administered 2019-09-18 – 2019-09-22 (×12): 1 via ORAL
  Filled 2019-09-15 (×13): qty 1

## 2019-09-15 MED ORDER — ROCURONIUM BROMIDE 10 MG/ML (PF) SYRINGE
PREFILLED_SYRINGE | INTRAVENOUS | Status: DC | PRN
  Administered 2019-09-15: 40 mg via INTRAVENOUS
  Administered 2019-09-15: 50 mg via INTRAVENOUS
  Administered 2019-09-15: 60 mg via INTRAVENOUS

## 2019-09-15 MED ORDER — CYCLOBENZAPRINE HCL 10 MG PO TABS
10.0000 mg | ORAL_TABLET | Freq: Three times a day (TID) | ORAL | Status: DC | PRN
  Administered 2019-09-18: 10 mg via ORAL
  Filled 2019-09-15: qty 1

## 2019-09-15 MED ORDER — PHENOL 1.4 % MT LIQD
1.0000 | OROMUCOSAL | Status: DC | PRN
  Filled 2019-09-15: qty 177

## 2019-09-15 MED ORDER — SODIUM CHLORIDE 0.9% FLUSH
3.0000 mL | Freq: Two times a day (BID) | INTRAVENOUS | Status: DC
  Administered 2019-09-16 – 2019-09-22 (×11): 3 mL via INTRAVENOUS

## 2019-09-15 MED ORDER — INSULIN ASPART 100 UNIT/ML ~~LOC~~ SOLN
5.0000 [IU] | Freq: Once | SUBCUTANEOUS | Status: AC
  Administered 2019-09-15: 5 [IU] via SUBCUTANEOUS

## 2019-09-15 MED ORDER — SODIUM CHLORIDE 0.9 % IV SOLN: 250.0000 mL | INTRAVENOUS | Status: DC

## 2019-09-15 MED ORDER — LIDOCAINE 2% (20 MG/ML) 5 ML SYRINGE
INTRAMUSCULAR | Status: DC | PRN
  Administered 2019-09-15: 40 mg via INTRAVENOUS

## 2019-09-15 MED ORDER — HYDROCODONE-ACETAMINOPHEN 7.5-325 MG PO TABS
1.0000 | ORAL_TABLET | Freq: Four times a day (QID) | ORAL | Status: AC
  Administered 2019-09-16 – 2019-09-18 (×12): 1 via ORAL
  Filled 2019-09-15 (×12): qty 1

## 2019-09-15 MED ORDER — 0.9 % SODIUM CHLORIDE (POUR BTL) OPTIME
TOPICAL | Status: DC | PRN
  Administered 2019-09-15: 18:00:00 1000 mL

## 2019-09-15 MED ORDER — POTASSIUM CHLORIDE IN NACL 20-0.9 MEQ/L-% IV SOLN
INTRAVENOUS | Status: DC
  Filled 2019-09-15: qty 1000

## 2019-09-15 MED ORDER — PROMETHAZINE HCL 25 MG/ML IJ SOLN: 6.2500 mg | INTRAMUSCULAR | Status: DC | PRN

## 2019-09-15 MED ORDER — MAGNESIUM CITRATE PO SOLN: 1.0000 | Freq: Once | ORAL | Status: DC | PRN

## 2019-09-15 MED ORDER — MENTHOL 3 MG MT LOZG: 1.0000 | LOZENGE | OROMUCOSAL | Status: DC | PRN

## 2019-09-15 MED ORDER — SODIUM CHLORIDE 0.9% FLUSH
3.0000 mL | INTRAVENOUS | Status: DC | PRN
  Administered 2019-09-20: 3 mL via INTRAVENOUS

## 2019-09-15 MED ORDER — DEXAMETHASONE 4 MG PO TABS
4.0000 mg | ORAL_TABLET | Freq: Two times a day (BID) | ORAL | Status: AC
  Administered 2019-09-15 – 2019-09-17 (×4): 4 mg via ORAL
  Filled 2019-09-15 (×4): qty 1

## 2019-09-15 MED ORDER — HYDROMORPHONE HCL 1 MG/ML IJ SOLN: 0.2500 mg | INTRAMUSCULAR | Status: DC | PRN

## 2019-09-15 MED ORDER — DEXAMETHASONE SODIUM PHOSPHATE 4 MG/ML IJ SOLN: 4.0000 mg | Freq: Two times a day (BID) | INTRAMUSCULAR | Status: AC

## 2019-09-15 MED ORDER — INSULIN ASPART 100 UNIT/ML ~~LOC~~ SOLN
SUBCUTANEOUS | Status: AC
  Filled 2019-09-15: qty 1

## 2019-09-15 MED ORDER — ONDANSETRON HCL 4 MG/2ML IJ SOLN
INTRAMUSCULAR | Status: DC | PRN
  Administered 2019-09-15: 4 mg via INTRAVENOUS

## 2019-09-15 MED ORDER — MEPERIDINE HCL 25 MG/ML IJ SOLN: 6.2500 mg | INTRAMUSCULAR | Status: DC | PRN

## 2019-09-15 MED ORDER — FENTANYL CITRATE (PF) 250 MCG/5ML IJ SOLN
INTRAMUSCULAR | Status: AC
  Filled 2019-09-15: qty 5

## 2019-09-15 MED ORDER — DEXTROSE 5 % IV SOLN
3.0000 g | INTRAVENOUS | Status: AC
  Administered 2019-09-15: 3 g via INTRAVENOUS
  Filled 2019-09-15: qty 3

## 2019-09-15 MED ORDER — LACTATED RINGERS IV SOLN: INTRAVENOUS | Status: DC

## 2019-09-15 SURGICAL SUPPLY — 58 items
ADH SKN CLS APL DERMABOND .7 (GAUZE/BANDAGES/DRESSINGS) ×1
BASKET BONE COLLECTION (BASKET) ×2 IMPLANT
BLADE CLIPPER SURG (BLADE) IMPLANT
BUR DRUM 4.0 (BURR) ×2 IMPLANT
BUR MATCHSTICK NEURO 3.0 LAGG (BURR) ×2 IMPLANT
BUR ROUND FLUTED 5 RND (BURR) ×1 IMPLANT
CAGE BENGAL STACKABLE 24 6 (Cage) ×1 IMPLANT
CANISTER SUCT 3000ML PPV (MISCELLANEOUS) ×2 IMPLANT
CARTRIDGE OIL MAESTRO DRILL (MISCELLANEOUS) ×1 IMPLANT
COVER WAND RF STERILE (DRAPES) ×1 IMPLANT
DECANTER SPIKE VIAL GLASS SM (MISCELLANEOUS) ×2 IMPLANT
DERMABOND ADVANCED (GAUZE/BANDAGES/DRESSINGS) ×1
DERMABOND ADVANCED .7 DNX12 (GAUZE/BANDAGES/DRESSINGS) ×1 IMPLANT
DIFFUSER DRILL AIR PNEUMATIC (MISCELLANEOUS) ×2 IMPLANT
DRAPE HALF SHEET 40X57 (DRAPES) IMPLANT
DRAPE LAPAROTOMY 100X72 PEDS (DRAPES) ×2 IMPLANT
DRAPE MICROSCOPE LEICA (MISCELLANEOUS) ×2 IMPLANT
DURAPREP 6ML APPLICATOR 50/CS (WOUND CARE) ×2 IMPLANT
ELECT COATED BLADE 2.86 ST (ELECTRODE) ×2 IMPLANT
ELECT REM PT RETURN 9FT ADLT (ELECTROSURGICAL) ×2
ELECTRODE REM PT RTRN 9FT ADLT (ELECTROSURGICAL) ×1 IMPLANT
GAUZE 4X4 16PLY RFD (DISPOSABLE) IMPLANT
GLOVE BIO SURGEON STRL SZ 6.5 (GLOVE) ×6 IMPLANT
GLOVE BIOGEL PI IND STRL 6.5 (GLOVE) IMPLANT
GLOVE BIOGEL PI INDICATOR 6.5 (GLOVE) ×1
GLOVE ECLIPSE 6.5 STRL STRAW (GLOVE) ×4 IMPLANT
GLOVE EXAM NITRILE XL STR (GLOVE) IMPLANT
GOWN STRL REUS W/ TWL LRG LVL3 (GOWN DISPOSABLE) ×2 IMPLANT
GOWN STRL REUS W/ TWL XL LVL3 (GOWN DISPOSABLE) IMPLANT
GOWN STRL REUS W/TWL 2XL LVL3 (GOWN DISPOSABLE) IMPLANT
GOWN STRL REUS W/TWL LRG LVL3 (GOWN DISPOSABLE) ×6
GOWN STRL REUS W/TWL XL LVL3 (GOWN DISPOSABLE)
HALTER HD/CHIN CERV TRACTION D (MISCELLANEOUS) ×1 IMPLANT
HEMOSTAT SURGICEL 2X14 (HEMOSTASIS) IMPLANT
KIT BASIN OR (CUSTOM PROCEDURE TRAY) ×2 IMPLANT
KIT TURNOVER KIT B (KITS) ×2 IMPLANT
NDL SPNL 22GX3.5 QUINCKE BK (NEEDLE) ×1 IMPLANT
NEEDLE HYPO 25X1 1.5 SAFETY (NEEDLE) ×2 IMPLANT
NEEDLE SPNL 22GX3.5 QUINCKE BK (NEEDLE) ×4 IMPLANT
NS IRRIG 1000ML POUR BTL (IV SOLUTION) ×2 IMPLANT
OIL CARTRIDGE MAESTRO DRILL (MISCELLANEOUS) ×2
PACK LAMINECTOMY NEURO (CUSTOM PROCEDURE TRAY) ×2 IMPLANT
PAD ARMBOARD 7.5X6 YLW CONV (MISCELLANEOUS) ×6 IMPLANT
PLATE ACP 1.9X58 3LVL (Plate) ×1 IMPLANT
RUBBERBAND STERILE (MISCELLANEOUS) ×4 IMPLANT
SCREW ACP 3.5 X 13 S/D VARIA (Screw) ×4 IMPLANT
SCREW ACP 3.5X13 S/D VAR ANGLE (Screw) IMPLANT
SCREW ACP ST VARI 3.5X13 (Screw) ×4 IMPLANT
SPACER ACF PARALLEL 9MM (Spacer) ×1 IMPLANT
SPONGE INTESTINAL PEANUT (DISPOSABLE) ×2 IMPLANT
SPONGE SURGIFOAM ABS GEL 100 (HEMOSTASIS) ×1 IMPLANT
SUT VIC AB 0 CT1 27 (SUTURE) ×2
SUT VIC AB 0 CT1 27XBRD ANTBC (SUTURE) IMPLANT
SUT VIC AB 3-0 SH 8-18 (SUTURE) ×2 IMPLANT
TOWEL GREEN STERILE (TOWEL DISPOSABLE) ×2 IMPLANT
TOWEL GREEN STERILE FF (TOWEL DISPOSABLE) ×2 IMPLANT
TRAY FOLEY MTR SLVR 16FR STAT (SET/KITS/TRAYS/PACK) ×1 IMPLANT
WATER STERILE IRR 1000ML POUR (IV SOLUTION) ×2 IMPLANT

## 2019-09-15 NOTE — Transfer of Care (Signed)
Immediate Anesthesia Transfer of Care Note  Patient: Doris Jackson  Procedure(s) Performed: Cervical five CorpectomyANTERIOR CERVICAL DECOMPRESSION/DISCECTOMY FUSION CERVICAL THREE-CERVICAL Four (N/A Spine Cervical)  Patient Location: PACU  Anesthesia Type:General  Level of Consciousness: alert , oriented, drowsy and patient cooperative  Airway & Oxygen Therapy: Patient Spontanous Breathing and Patient connected to nasal cannula oxygen  Post-op Assessment: Report given to RN, Post -op Vital signs reviewed and stable and Patient moving all extremities X 4  Post vital signs: Reviewed and stable  Last Vitals:  Vitals Value Taken Time  BP 126/74 09/15/19 2029  Temp    Pulse 81 09/15/19 2030  Resp 19 09/15/19 2030  SpO2 100 % 09/15/19 2030  Vitals shown include unvalidated device data.  Last Pain:  Vitals:   09/15/19 0905  TempSrc: Oral  PainSc:          Complications: No apparent anesthesia complications

## 2019-09-15 NOTE — Anesthesia Postprocedure Evaluation (Signed)
Anesthesia Post Note  Patient: Doris Jackson  Procedure(s) Performed: Cervical five CorpectomyANTERIOR CERVICAL DECOMPRESSION/DISCECTOMY FUSION CERVICAL THREE-CERVICAL Four (N/A Spine Cervical)     Patient location during evaluation: PACU Anesthesia Type: General Level of consciousness: awake and alert Pain management: pain level controlled Vital Signs Assessment: post-procedure vital signs reviewed and stable Respiratory status: spontaneous breathing, nonlabored ventilation and respiratory function stable Cardiovascular status: blood pressure returned to baseline and stable Postop Assessment: no apparent nausea or vomiting Anesthetic complications: no    Last Vitals:  Vitals:   09/15/19 2100 09/15/19 2115  BP: 133/62 136/67  Pulse: 82 87  Resp: 14 18  Temp:  36.6 C  SpO2: 96% 96%    Last Pain:  Vitals:   09/15/19 2115  TempSrc:   PainSc: Asleep    LLE Motor Response: Purposeful movement;Responds to commands (09/15/19 2115) LLE Sensation: Full sensation (09/15/19 2115) RLE Motor Response: Purposeful movement;Responds to commands (09/15/19 2115) RLE Sensation: Full sensation (09/15/19 2115)      Audry Pili

## 2019-09-15 NOTE — Plan of Care (Signed)
  Problem: Education: Goal: Knowledge of General Education information will improve Description: Including pain rating scale, medication(s)/side effects and non-pharmacologic comfort measures Outcome: Progressing   Problem: Health Behavior/Discharge Planning: Goal: Ability to manage health-related needs will improve Outcome: Progressing   Problem: Clinical Measurements: Goal: Ability to maintain clinical measurements within normal limits will improve Outcome: Progressing   Problem: Activity: Goal: Risk for activity intolerance will decrease Outcome: Progressing   Problem: Nutrition: Goal: Adequate nutrition will be maintained Outcome: Progressing   Problem: Pain Managment: Goal: General experience of comfort will improve Outcome: Progressing   Problem: Skin Integrity: Goal: Risk for impaired skin integrity will decrease Outcome: Progressing   

## 2019-09-15 NOTE — Op Note (Signed)
09/15/2019  8:48 PM  PATIENT:  Doris Jackson  79 y.o. female presented with cervical myelopathy who presented via the ED due to worsening symptoms and pain. We had already discussed a Cervical decompression to relieve the pressure on her spinal cord. She has agreed to operative decompression.   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 She was positioned supine with her head in slight extension on a horseshoe headrest. I applied 5 lbs of traction via a chin strap. The neck was prepped and draped in a sterile manner. I infiltrated 4 cc's 1/2%lidocaine/1:200,000 strength epinephrine into the planned incision starting from the midline to the medial border of the left sternocleidomastoid muscle. I opened the incision with a 10 blade and dissected sharply through soft tissue to the platysma. I dissected in the plane superior to the platysma both rostrally and caudally. I then opened the platysma in a horizontal fashion with Metzenbaum scissors, and dissected in the inferior plane rostrally and caudally. With both blunt and sharp technique I created an avascular corridor to the cervical spine. I placed a spinal needle(s) in the disc space at C3/4, and 4/5 . I then reflected the longus colli from C3 to C6 and  placed self retaining retractors. I opened the disc space(s) at C3/4,4/5, and 5/6 with a 15 blade. I removed disc at C3/4 with curettes, Kerrison punches, and the drill. Using the drill I removed osteophytes and prepared for the decompression.  I decompressed the spinal canal and the C4,5,and 6 root(s) with the drill, Kerrison punches, and the curettes. I used the microscope to aid in microdissection. At C3/4 I performed a discetomy I removed the posterior longitudinal ligament to fully expose and decompress the thecal sac. I exposed the roots laterally taking down the C3/4 uncovertebral joints. I performed a C5 corpectomy due to the anatomy and the markedly degenerated disc spaces at 4/5, and 5/6. I removed C5 with rongeurs, punches and the drill. The dura was exposed to ensure adequate decompression of the spinal canal from 4-6.  With the decompression complete I moved on to the arthrodesis. I used the drill to level the surfaces of C3,4,and 6. I removed soft tissue to prepare the disc space and the bony surfaces at C3/4. I measured the space and placed a 37mm structural allograft into the disc space. At C4-6 I placed a 24mm Synthes peek cage filled with autograft morsels I then placed the anterior instrumentation. I placed 2 screws in C6, and C4. I placed one screw at C3 on the left side. The right side screw did not achieve good purchase so I removed it.  I locked the screws into place. Intraoperative xray showed the graft, plate, and screws to be in good position. I irrigated the wound, achieved hemostasis, and closed the wound in layers. I approximated the platysma, and the subcuticular plane with vicryl sutures. I used Dermabond for a  sterile dressing.   PLAN OF CARE: Admit to inpatient   PATIENT DISPOSITION:  PACU - hemodynamically stable.   Delay start of Pharmacological VTE agent (>24hrs) due to surgical blood loss or risk of bleeding:  yes

## 2019-09-15 NOTE — Anesthesia Preprocedure Evaluation (Addendum)
Anesthesia Evaluation  Patient identified by MRN, date of birth, ID band Patient awake    Reviewed: Allergy & Precautions, NPO status , Patient's Chart, lab work & pertinent test results  History of Anesthesia Complications Negative for: history of anesthetic complications  Airway Mallampati: II  TM Distance: >3 FB Neck ROM: Full    Dental  (+) Dental Advisory Given, Poor Dentition   Pulmonary COPD,  09/14/2019 SARS coronavirus NEG   breath sounds clear to auscultation       Cardiovascular hypertension, Pt. on medications and Pt. on home beta blockers (-) angina Rhythm:Regular Rate:Normal     Neuro/Psych Cervical myelopathy with L sided weakness     GI/Hepatic Neg liver ROS, GERD  Medicated and Controlled,  Endo/Other  diabetes (glu 157), Oral Hypoglycemic AgentsMorbid obesity  Renal/GU Renal InsufficiencyRenal disease (creat 1.51)     Musculoskeletal   Abdominal (+) + obese,   Peds  Hematology  (+) Blood dyscrasia (Hb 10.6), anemia ,   Anesthesia Other Findings   Reproductive/Obstetrics                            Anesthesia Physical Anesthesia Plan  ASA: III  Anesthesia Plan: General   Post-op Pain Management:    Induction: Intravenous  PONV Risk Score and Plan: 3 and Ondansetron, Dexamethasone and Treatment may vary due to age or medical condition  Airway Management Planned: Video Laryngoscope Planned and Oral ETT  Additional Equipment:   Intra-op Plan:   Post-operative Plan: Extubation in OR  Informed Consent: I have reviewed the patients History and Physical, chart, labs and discussed the procedure including the risks, benefits and alternatives for the proposed anesthesia with the patient or authorized representative who has indicated his/her understanding and acceptance.     Dental advisory given  Plan Discussed with: CRNA and Surgeon  Anesthesia Plan Comments:         Anesthesia Quick Evaluation

## 2019-09-15 NOTE — Progress Notes (Signed)
PROGRESS NOTE    Doris Jackson  [id number] DOB: Feb 26, 1941 DOA: 09/14/2019 PCP: Caren Macadam, MD    Brief Narrative:  79 year old female with a history of diabetes, hypertension, hyperlipidemia, presents to the emergency room with worsening numbness and tingling in her hands and feet bilaterally.  MRI of the brain and cervical spine was recently performed approximately 4 days prior to admission when she had visited the emergency room.  At that time, showed cord compression due to severe spinal stenosis.  She was advised for admission and surgery, but she refused at that time since she wanted to discuss this further with her family.  She returned to the hospital with worsening symptoms and was admitted to the hospital for operative management.  She was evaluated by neurosurgery and underwent surgical management on 3/18.   Assessment & Plan:   Principal Problem:   Spinal stenosis in cervical region Active Problems:   DM (diabetes mellitus) type II controlled with renal manifestation (HCC)   Dyslipidemia   Microcytic anemia   Essential hypertension   Obesity, morbid, BMI 40.0-49.9 (HCC)   AKI (acute kidney injury) (HCC)   GERD (gastroesophageal reflux disease)   Numbness and tingling   Cervical spinal stenosis   1. Cervical spinal stenosis.  Status post anterior cervical decompression by neurosurgery.  Currently on Decadron 4 mg twice daily.  Further postop care per neurosurgery.  Await for physical therapy evaluation. 2. Acute kidney injury.  Improving with IV fluids.  Baseline creatinine approximately 1.2.   3. Hypertension.  Chronically on amlodipine/benazepril, Toprol.  Blood pressures currently stable.  Continue current treatments 4. Diabetes.  Chronically on Januvia, Metformin, pioglitazone, glipizide.  Currently on glipizide, pioglitazone as well as sliding scale insulin.  Blood sugars were stable presurgery.  Continue to monitor. 5. GERD.  Continue PPI 6. Microcytic  anemia.  Continue iron supplementation.  Iron studies are relatively unrevealing.  No reported melena.  Hemoglobin stable. 7. Hyperlipidemia.  Continue statin   DVT prophylaxis: SCDs Code Status: Full code Family Communication: None present Disposition Plan: Patient is from home.  Disposition we will plan on physical therapy evaluation.  Current barriers to discharge is limited mobility and need for PT evaluation to determine disposition.   Consultants:   Neurosurgery  Procedures:  Anterior Cervical decompression C3/4 C5 cervical corpectomy Arthrodesis C3-4 with 87mm structural allograft Arthrodesis C4-6 37mm peek cage with autograft morsels Anterior instrumentation(Nuvasive ACP) C3-6  Antimicrobials:      Subjective: Patient seen in PACU postoperatively.  Reports that her pain is reasonably controlled.  Has some soreness in her gums.  Denies any shortness of breath.  Objective: Vitals:   09/15/19 0338 09/15/19 0905 09/15/19 1434 09/15/19 2030  BP: (!) 146/72 128/68  126/74  Pulse: 64 60  79  Resp: 18   16  Temp: (!) 97.5 F (36.4 C) 97.9 F (36.6 C)  98.2 F (36.8 C)  TempSrc: Oral Oral    SpO2: 100% 100%  100%  Weight:   99.8 kg   Height:   5' 2.01" (1.575 m)     Intake/Output Summary (Last 24 hours) at 09/15/2019 2054 Last data filed at 09/15/2019 2020 Gross per 24 hour  Intake 3187.54 ml  Output 1125 ml  Net 2062.54 ml   Filed Weights   09/15/19 1434  Weight: 99.8 kg    Examination:  General exam: Appears calm and comfortable  Respiratory system: Clear to auscultation. Respiratory effort normal. Cardiovascular system: S1 & S2 heard, RRR. No JVD,  murmurs, rubs, gallops or clicks. No pedal edema. Gastrointestinal system: Abdomen is nondistended, soft and nontender. No organomegaly or masses felt. Normal bowel sounds heard. Central nervous system: Alert and oriented. No focal neurological deficits. Extremities: Symmetric 5 x 5 power. Skin: No rashes,  lesions or ulcers Psychiatry: Judgement and insight appear normal. Mood & affect appropriate.     Data Reviewed: I have personally reviewed following labs and imaging studies  CBC: Recent Labs  Lab 09/10/19 1529 09/14/19 1307 09/15/19 0328  WBC 6.9 10.0 9.7  HGB 11.8* 11.6* 10.6*  HCT 35.5* 34.8* 31.5*  MCV 79.8* 78.9* 79.5*  PLT 164 167 [id number]   Basic Metabolic Panel: Recent Labs  Lab 09/10/19 1529 09/14/19 1307 09/14/19 1422 09/15/19 0328  NA 139 138  --  143  K 3.4* 4.1  --  3.8  CL 99 100  --  106  CO2 26 23  --  25  GLUCOSE 145* 247*  --  144*  BUN 24* 65*  --  50*  CREATININE 1.35* 1.78*  --  1.51*  CALCIUM 9.4 9.1  --  8.9  MG  --   --  1.8  --    GFR: Estimated Creatinine Clearance: 34.5 mL/min (A) (by C-G formula based on SCr of 1.51 mg/dL (H)). Liver Function Tests: Recent Labs  Lab 09/14/19 1307  AST 14*  ALT 14  ALKPHOS 62  BILITOT 0.5  PROT 7.4  ALBUMIN 3.5   No results for input(s): LIPASE, AMYLASE in the last 168 hours. No results for input(s): AMMONIA in the last 168 hours. Coagulation Profile: Recent Labs  Lab 09/15/19 1118  INR 1.0   Cardiac Enzymes: No results for input(s): CKTOTAL, CKMB, CKMBINDEX, TROPONINI in the last 168 hours. BNP (last 3 results) No results for input(s): PROBNP in the last 8760 hours. HbA1C: No results for input(s): HGBA1C in the last 72 hours. CBG: Recent Labs  Lab 09/15/19 0630 09/15/19 1144 09/15/19 1404 09/15/19 1440 09/15/19 2029  GLUCAP 136* 76 126* 157* 274*   Lipid Profile: No results for input(s): CHOL, HDL, LDLCALC, TRIG, CHOLHDL, LDLDIRECT in the last 72 hours. Thyroid Function Tests: No results for input(s): TSH, T4TOTAL, FREET4, T3FREE, THYROIDAB in the last 72 hours. Anemia Panel: Recent Labs    09/14/19 1856  FERRITIN 92  TIBC 308  IRON 46   Sepsis Labs: No results for input(s): PROCALCITON, LATICACIDVEN in the last 168 hours.  Recent Results (from the past 240 hour(s))  SARS  CORONAVIRUS 2 (TAT 6-24 HRS) Nasopharyngeal Nasopharyngeal Swab     Status: None   Collection Time: 09/14/19  2:13 PM   Specimen: Nasopharyngeal Swab  Result Value Ref Range Status   SARS Coronavirus 2 NEGATIVE NEGATIVE Final    Comment: (NOTE) SARS-CoV-2 target nucleic acids are NOT DETECTED. The SARS-CoV-2 RNA is generally detectable in upper and lower respiratory specimens during the acute phase of infection. Negative results do not preclude SARS-CoV-2 infection, do not rule out co-infections with other pathogens, and should not be used as the sole basis for treatment or other patient management decisions. Negative results must be combined with clinical observations, patient history, and epidemiological information. The expected result is Negative. Fact Sheet for Patients: SugarRoll.be Fact Sheet for Healthcare Providers: https://www.woods-mathews.com/ This test is not yet approved or cleared by the [place] FDA and  has been authorized for detection and/or diagnosis of SARS-CoV-2 by FDA under an Emergency Use Authorization (EUA). This EUA will remain  in effect (meaning this test  can be used) for the duration of the COVID-19 declaration under Section 56 4(b)(1) of the Act, 21 U.S.C. section 360bbb-3(b)(1), unless the authorization is terminated or revoked sooner. Performed at [facility], [place] [address] [place], [place] [address]   Surgical pcr screen     Status: None   Collection Time: 09/15/19 10:34 AM   Specimen: Nasal Mucosa; Nasal Swab  Result Value Ref Range Status   MRSA, PCR NEGATIVE NEGATIVE Final   Staphylococcus aureus NEGATIVE NEGATIVE Final    Comment: (NOTE) The Xpert SA Assay (FDA approved for NASAL specimens in patients 42 years of age and older), is one component of a comprehensive surveillance program. It is not intended to diagnose infection nor to guide or monitor treatment. Performed at [facility], [place] [address], [place], [place] [address]          Radiology Studies: DG Chest 2 View  Result Date: 09/15/2019 CLINICAL DATA:  Diabetes, shortness of breath EXAM: CHEST - 2 VIEW COMPARISON:  04/19/2019 FINDINGS: Cardiomegaly, vascular congestion. No confluent opacities or edema. No effusions. Calcified granuloma at the left lung base. IMPRESSION: Cardiomegaly, vascular congestion. Calcified left base granuloma. No acute findings. Electronically Signed   By: Rolm Baptise M.D.   On: 09/15/2019 10:59        Scheduled Meds: . [MAR Hold] amLODipine  5 mg Oral Daily   And  . [MAR Hold] benazepril  40 mg Oral Daily  . [MAR Hold] dexamethasone  4 mg Oral BID WC  . [MAR Hold] glipiZIDE  5 mg Oral Daily  . [MAR Hold] hydrochlorothiazide  25 mg Oral Daily  . insulin aspart      . [MAR Hold] insulin aspart  0-15 Units Subcutaneous TID WC  . [MAR Hold] insulin aspart  0-5 Units Subcutaneous QHS  . [MAR Hold] iron polysaccharides  150 mg Oral Daily  . [MAR Hold] loratadine  10 mg Oral Daily  . [MAR Hold] metoprolol succinate  50 mg Oral Daily  . [MAR Hold] pantoprazole  40 mg Oral QHS  . [MAR Hold] pioglitazone  45 mg Oral Daily  . [MAR Hold] pravastatin  40 mg Oral Daily  . [MAR Hold] prednisoLONE acetate  1 drop Left Eye Q6H   And  . [MAR Hold] prednisoLONE acetate  1 drop Right Eye BID   Continuous Infusions: . sodium chloride 100 mL/hr at 09/15/19 0337  . lactated ringers 10 mL/hr at 09/15/19 1452     LOS: 1 day    Time spent: 32mins    Kathie Dike, MD [facility]   If 7PM-7AM, please contact night-coverage [email or web address]  09/15/2019, 8:54 PM

## 2019-09-15 NOTE — Anesthesia Procedure Notes (Addendum)
Procedure Name: Intubation Date/Time: 09/15/2019 4:42 PM Performed by: Darletta Moll, CRNA Pre-anesthesia Checklist: Patient identified, Emergency Drugs available, Suction available and Patient being monitored Patient Re-evaluated:Patient Re-evaluated prior to induction Oxygen Delivery Method: Circle system utilized Preoxygenation: Pre-oxygenation with 100% oxygen Induction Type: IV induction Ventilation: Mask ventilation without difficulty and Oral airway inserted - appropriate to patient size Laryngoscope Size: Glidescope and 3 Grade View: Grade I Tube type: Oral Tube size: 7.0 mm Number of attempts: 1 Airway Equipment and Method: Stylet Placement Confirmation: ETT inserted through vocal cords under direct vision,  positive ETCO2 and breath sounds checked- equal and bilateral Secured at: 21 cm Tube secured with: Tape Dental Injury: Teeth and Oropharynx as per pre-operative assessment  Difficulty Due To: Difficult Airway- due to reduced neck mobility Comments: Head and neck maintained in neutral spine

## 2019-09-15 NOTE — Plan of Care (Signed)

## 2019-09-15 NOTE — Evaluation (Signed)
Occupational Therapy Evaluation Patient Details Name: Doris Jackson MRN: [id number] DOB: 04/11/1941 Today's Date: 09/15/2019    History of Present Illness Doris Jackson is a 79 y.o. female with medical history significant of hypertension, hyperlipidemia, diabetes mellitus, seasonal allergies, left eye corneal transplant, microcytic anemia, presents to emergency department due to worsening of numbness tingling sensation in both hands and feet. Patient was seen in the ER 4 days ago with similar symptoms but refused admission and dced home.  MRI brain and cervical spine was obtained which showed cord compression due to severe spinal stenosis.   Clinical Impression   Pt admitted with weakness . Pt currently with functional limitations due to the deficits listed below (see OT Problem List).  Pt will benefit from skilled OT to increase their safety and independence with ADL and functional mobility for ADL to facilitate discharge to venue listed below.   Pt inquisitive about plan regarding sx.  Pt willing to get OOB with OT and participate in grooming task. Pt very Pleasant!     Follow Up Recommendations  Other (comment)(depending on progress post sx)    Equipment Recommendations  Other (comment);3 in 1 bedside commode(depending on progress post sx)    Recommendations for Other Services       Precautions / Restrictions Precautions Precautions: Fall Restrictions Weight Bearing Restrictions: No      Mobility Bed Mobility Overal bed mobility: Needs Assistance Bed Mobility: Supine to Sit     Supine to sit: Min assist        Transfers Overall transfer level: Needs assistance   Transfers: Stand Pivot Transfers;Sit to/from Stand Sit to Stand: Min assist Stand pivot transfers: Min assist;From elevated surface                ADL either performed or assessed with clinical judgement   ADL Overall ADL's : Needs assistance/impaired Eating/Feeding: Minimal assistance;Sitting    Grooming: Wash/dry hands;Wash/dry face;Minimal assistance;Sitting   Upper Body Bathing: Minimal assistance;Sitting   Lower Body Bathing: Maximal assistance;Sit to/from stand;Cueing for safety;Cueing for compensatory techniques;Cueing for sequencing   Upper Body Dressing : Minimal assistance;Sitting   Lower Body Dressing: Maximal assistance;Sit to/from stand;Cueing for safety;Cueing for compensatory techniques;Cueing for sequencing   Toilet Transfer: Moderate assistance;Stand-pivot;Cueing for sequencing;Cueing for safety;RW;BSC   Toileting- Clothing Manipulation and Hygiene: Maximal assistance;Sit to/from stand;Cueing for compensatory techniques;Cueing for safety;Cueing for sequencing         General ADL Comments: Pt awaiting decision regarding surgery.  Spoke with RN regarding pt being OOB and pt interested in knowing plan regarding sx     Vision Patient Visual Report: No change from baseline              Pertinent Vitals/Pain Pain Assessment: No/denies pain     Hand Dominance     Extremity/Trunk Assessment Upper Extremity Assessment Upper Extremity Assessment: RUE deficits/detail;LUE deficits/detail;Generalized weakness LUE Deficits / Details: pt with significant weakness BUE including numbness and tingling in both hands. Reports dropping items often           Communication Communication Communication: No difficulties   Cognition Arousal/Alertness: Awake/alert Behavior During Therapy: WFL for tasks assessed/performed Overall Cognitive Status: Within Functional Limits for tasks assessed                                                Home Living Family/patient expects to  be discharged to:: Private residence Living Arrangements: Spouse/significant other Available Help at Discharge: Family Type of Home: [place]: One level     Bathroom Shower/Tub: Teacher, early years/pre: Standard                Prior  Functioning/Environment Level of Independence: Independent        Comments: pt was I until approx 3 weeks ago. Husband has been assisting with ADL activity        OT Problem List: Decreased strength;Decreased activity tolerance;Impaired balance (sitting and/or standing);Decreased knowledge of use of DME or AE;Impaired sensation;Impaired UE functional use      OT Treatment/Interventions: Self-care/ADL training;Patient/family education;DME and/or AE instruction;Therapeutic activities    OT Goals(Current goals can be found in the care plan section) Acute Rehab OT Goals Patient Stated Goal: get well OT Goal Formulation: With patient Time For Goal Achievement: 09/22/19 Potential to Achieve Goals: Good ADL Goals Pt Will Perform Grooming: with supervision;standing Pt Will Perform Upper Body Dressing: with supervision;standing Pt Will Perform Lower Body Dressing: with supervision;sit to/from stand Pt Will Transfer to Toilet: with supervision;regular height toilet Pt Will Perform Toileting - Clothing Manipulation and hygiene: with supervision  OT Frequency: Min 2X/week    AM-PAC OT "6 Clicks" Daily Activity     Outcome Measure Help from another person eating meals?: A Little Help from another person taking care of personal grooming?: A Little Help from another person toileting, which includes using toliet, bedpan, or urinal?: A Lot Help from another person bathing (including washing, rinsing, drying)?: A Little Help from another person to put on and taking off regular upper body clothing?: A Lot Help from another person to put on and taking off regular lower body clothing?: A Lot 6 Click Score: 15   End of Session Equipment Utilized During Treatment: Surveyor, mining Communication: Mobility status  Activity Tolerance: Patient tolerated treatment well Patient left: in chair;with call bell/phone within reach;with chair alarm set  OT Visit Diagnosis: Unsteadiness on feet  (R26.81);Muscle weakness (generalized) (M62.81);History of falling (Z91.81)                Time: [id number] OT Time Calculation (min): 17 min Charges:  OT General Charges $OT Visit: 1 Visit OT Evaluation $OT Eval Moderate Complexity: 1 Mod  Kari Baars, [place] [phone] Office- [phone], Edwena Felty D 09/15/2019, 12:34 PM

## 2019-09-15 NOTE — Progress Notes (Signed)
PT Cancellation Note    X Treatment cancelled today due to patient receiving procedure or test   PT will check back on patient when medically appropriate as she currently is having surgery.   Ann Held PT, DPT Acute Rehab [facility] P: [phone]

## 2019-09-16 LAB — BASIC METABOLIC PANEL
Anion gap: 9 (ref 5–15)
Anion gap: 12 (ref 5–15)
BUN: 28 mg/dL — ABNORMAL HIGH (ref 8–23)
BUN: 34 mg/dL — ABNORMAL HIGH (ref 8–23)
CO2: 20 mmol/L — ABNORMAL LOW (ref 22–32)
CO2: 24 mmol/L (ref 22–32)
Calcium: 7.3 mg/dL — ABNORMAL LOW (ref 8.9–10.3)
Calcium: 8.5 mg/dL — ABNORMAL LOW (ref 8.9–10.3)
Chloride: 114 mmol/L — ABNORMAL HIGH (ref 98–111)
Chloride: 105 mmol/L (ref 98–111)
Creatinine, Ser: 1.03 mg/dL — ABNORMAL HIGH (ref 0.44–1.00)
Creatinine, Ser: 1.17 mg/dL — ABNORMAL HIGH (ref 0.44–1.00)
GFR calc Af Amer: >60 mL/min (ref >60)
GFR calc Af Amer: 52 mL/min — ABNORMAL LOW (ref >60)
GFR calc non Af Amer: 52 mL/min — ABNORMAL LOW (ref >60)
GFR calc non Af Amer: 45 mL/min — ABNORMAL LOW (ref >60)
Glucose, Bld: 191 mg/dL — ABNORMAL HIGH (ref 70–99)
Glucose, Bld: 221 mg/dL — ABNORMAL HIGH (ref 70–99)
Potassium: 7 mmol/L (ref 3.5–5.1)
Potassium: 4.9 mmol/L (ref 3.5–5.1)
Sodium: 143 mmol/L (ref 135–145)
Sodium: 141 mmol/L (ref 135–145)

## 2019-09-16 LAB — CBC
HCT: 26 % — ABNORMAL LOW (ref 36.0–46.0)
Hemoglobin: 8.8 g/dL — ABNORMAL LOW (ref 12.0–15.0)
MCH: 26.7 pg (ref 26.0–34.0)
MCHC: 33.8 g/dL (ref 30.0–36.0)
MCV: 78.8 fL — ABNORMAL LOW (ref 80.0–100.0)
Platelets: 124 10*3/uL — ABNORMAL LOW (ref 150–400)
RBC: 3.3 MIL/uL — ABNORMAL LOW (ref 3.87–5.11)
RDW: 16.2 % — ABNORMAL HIGH (ref 11.5–15.5)
WBC: 11.4 10*3/uL — ABNORMAL HIGH (ref 4.0–10.5)
nRBC: 0 % (ref 0.0–0.2)

## 2019-09-16 LAB — GLUCOSE, CAPILLARY
Glucose-Capillary: 191 mg/dL — ABNORMAL HIGH (ref 70–99)
Glucose-Capillary: 259 mg/dL — ABNORMAL HIGH (ref 70–99)
Glucose-Capillary: 179 mg/dL — ABNORMAL HIGH (ref 70–99)
Glucose-Capillary: 207 mg/dL — ABNORMAL HIGH (ref 70–99)

## 2019-09-16 NOTE — Progress Notes (Signed)
Patient ID: Doris Jackson, female   DOB: [birth year], 79 y.o.   MRN: [id number] BP 126/72 (BP Location: Left Arm)   Pulse 85   Temp 98.2 F (36.8 C) (Oral)   Resp 18   Ht 5' 2.01" (1.575 m)   Wt 99.8 kg   SpO2 96%   BMI 40.23 kg/m  Alert and oriented x 4, voice is strong Wound is clean, dry, no signs of infection Moving all extremities at baseline Continue pt/ot

## 2019-09-16 NOTE — Evaluation (Signed)
Clinical/Bedside Swallow Evaluation Patient Details  Name: Doris Jackson MRN: [id number] Date of Birth: 02-04-1941  Today's Date: 09/16/2019 Time: SLP Start Time (ACUTE ONLY): [id number] SLP Stop Time (ACUTE ONLY): 1411 SLP Time Calculation (min) (ACUTE ONLY): 22 min  Past Medical History:  Past Medical History:  Diagnosis Date  . ALLERGIC RHINITIS 06/17/2010  . ANEMIA-NOS 06/17/2010  . BREAST CANCER, HX OF 06/17/2010  . Cancer [facility]) 2008   right breast  . Cataract   . COPD 06/17/2010  . DIABETES MELLITUS, TYPE II 06/17/2010  . Endometriosis   . Fibroid    FIBROID  . HYPERLIPIDEMIA 06/17/2010  . HYPERTENSION 06/17/2010  . Leg swelling   . LOW BACK PAIN 06/17/2010  . OSTEOARTHRITIS 06/17/2010  . OSTEOPENIA 06/17/2010  . Weakness    Past Surgical History:  Past Surgical History:  Procedure Laterality Date  . ABDOMINAL HYSTERECTOMY  1988   TAH,LSO  . BREAST LUMPECTOMY  2007   LUMPECTOMY FOLLOWED BY RADIATION  . OOPHORECTOMY  1988   TAH,LSO  . TUBAL LIGATION     HPI:  Doris Jackson is a 79 y.o. female with medical history significant of HTN, hyperlipidemia, DM, seasonal allergies, left eye corneal transplant, microcytic anemia, presents to emergency department due to worsening of numbness tingling sensation in both hands and feet. MRI brain and cervical spine was obtained which showed cord compression due to severe spinal stenosis. S/p Anterior Cervical decompression C3-4 (3/18)   Assessment / Plan / Recommendation Clinical Impression   Pt has a sore throat, understandably so after surgery on previous date. Given this as well as her dietary restrictions (pt mindful of her sugars given h/o diabetes), she is very particular about what she will attempt to swallow. She has primarily been gravitating to thin liquids and some purees, and she swallows these with facial grimacing but no overt s/s of aspiration. She did not want to try anything more solid, but I anticipate that as she  continues to heal post-operatively that she will be able to resume her baseline diet. Therefore, will leave current diet of regular solids and thin liquids in place so that she can advance per her own comfort level. Will f/u briefly to ensure that she is progressing as expected.  SLP Visit Diagnosis: Dysphagia, unspecified (R13.10)    Aspiration Risk  Mild aspiration risk    Diet Recommendation Regular;Thin liquid   Liquid Administration via: Cup;Straw Medication Administration: Whole meds with liquid Supervision: Staff to assist with self feeding Compensations: Slow rate;Small sips/bites Postural Changes: Seated upright at 90 degrees    Other  Recommendations Oral Care Recommendations: Oral care BID   Follow up Recommendations None      Frequency and Duration min 2x/week  1 week       Prognosis Prognosis for Safe Diet Advancement: Good      Swallow Study   General HPI: Doris Jackson is a 79 y.o. female with medical history significant of HTN, hyperlipidemia, DM, seasonal allergies, left eye corneal transplant, microcytic anemia, presents to emergency department due to worsening of numbness tingling sensation in both hands and feet. MRI brain and cervical spine was obtained which showed cord compression due to severe spinal stenosis. S/p Anterior Cervical decompression C3-4 (3/18) Type of Study: Bedside Swallow Evaluation Previous Swallow Assessment: none in chart Diet Prior to this Study: Regular;Thin liquids Temperature Spikes Noted: No Respiratory Status: Room air History of Recent Intubation: No Behavior/Cognition: Alert;Cooperative;Pleasant mood Oral Cavity Assessment: Within Functional Limits Oral Care Completed  by SLP: No Oral Cavity - Dentition: Adequate natural dentition;Missing dentition Vision: Functional for self-feeding Self-Feeding Abilities: Needs assist Patient Positioning: Upright in bed Baseline Vocal Quality: Normal Volitional Swallow: Able to elicit     Oral/Motor/Sensory Function Overall Oral Motor/Sensory Function: Within functional limits   Ice Chips Ice chips: Not tested   Thin Liquid Thin Liquid: Within functional limits Presentation: Straw;Self Fed    Nectar Thick Nectar Thick Liquid: Not tested   Honey Thick Honey Thick Liquid: Not tested   Puree Puree: Within functional limits Presentation: Spoon   Solid     Solid: Not tested       Osie Bond., M.A. [place] Pager [phone] Office [phone]  09/16/2019,2:30 PM

## 2019-09-16 NOTE — TOC Initial Note (Signed)
Transition of Care [facility]) - Initial/Assessment Note    Patient Details  Name: Doris Jackson MRN: [id number] Date of Birth: 23-Jan-1941  Transition of Care [facility]) CM/SW Contact:    Curlene Labrum, RN Phone Number: 09/16/2019, 9:14 AM  Clinical Narrative:        Case management met with patient at the bedside to discuss discharge needs for home following a cervical decompression by Dr. Christella Noa on 09/15/19.  Patient lives at home with her husband and has all needed DME including a raised toilet seat.  Patient given Medicare choice regarding home health services and patient with no preference for home health services - but listed [facility], [facility], and [facility] as her top choices if possible.  Will waiting for PT evaluation prior to setting up [facility].    [facility] [address] and has a PCP in [place], [place] as listed for followup for medical needs.  Her husband drives and will be transporting her home by car at discharge.  No other needs for discharge noted at this time.    Expected Discharge Plan: [place] Barriers to Discharge: No Barriers Identified   Patient Goals and CMS Choice Patient states their goals for this hospitalization and ongoing recovery are:: I'm ready to go home and start getting better, although my neck is sore. CMS Medicare.gov Compare Post Acute Care list provided to:: Patient Choice offered to / list presented to : Patient  Expected Discharge Plan and Services Expected Discharge Plan: [place]   Discharge Planning Services: CM Consult Post Acute Care Choice: [place] arrangements for the past 2 months: Single Family Home                                      Prior Living Arrangements/Services Living arrangements for the past 2 months: Single Family Home Lives with:: Spouse Patient language and need for interpreter reviewed:: Yes Do you feel safe going back to  the place where you live?: Yes      Need for Family Participation in Patient Care: Yes (Comment) Care giver support system in place?: Yes (comment)   Criminal Activity/Legal Involvement Pertinent to Current Situation/Hospitalization: No - Comment as needed  Activities of Daily Living Home Assistive Devices/Equipment: Cane (specify quad or straight), Walker (specify type), Grab bars in shower ADL Screening (condition at time of admission) Patient's cognitive ability adequate to safely complete daily activities?: Yes Is the patient deaf or have difficulty hearing?: No Does the patient have difficulty seeing, even when wearing glasses/contacts?: No Does the patient have difficulty concentrating, remembering, or making decisions?: No Patient able to express need for assistance with ADLs?: Yes Does the patient have difficulty dressing or bathing?: Yes Independently performs ADLs?: No Does the patient have difficulty walking or climbing stairs?: Yes Weakness of Legs: Both Weakness of Arms/Hands: Both  Permission Sought/Granted Permission sought to share information with : Case Manager Permission granted to share information with : Yes, Verbal Permission Granted        Permission granted to share info w Relationship: spouse, Timea Breed     Emotional Assessment Appearance:: Appears stated age Attitude/Demeanor/Rapport: Gracious Affect (typically observed): Accepting Orientation: : Oriented to Self, Oriented to Place, Oriented to  Time, Oriented to Situation Alcohol / Substance Use: Not Applicable Psych Involvement: No (comment)  Admission diagnosis:  Cervical myelopathy ([place]) [  G95.9] Cervical spinal stenosis [M48.02] AKI (acute kidney injury) ([place]) [N17.9] Cervical spondylosis with myelopathy and radiculopathy [M47.12, M47.22] Patient Active Problem List   Diagnosis Date Noted  . 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 AKI (acute kidney injury) ([place])  09/14/2019  . GERD (gastroesophageal reflux disease) 09/14/2019  . Spinal stenosis in cervical region 09/14/2019  . Weakness 09/14/2019  . Numbness and tingling 09/14/2019  . Cervical spinal stenosis 09/14/2019  . Lumbar stenosis 08/24/2019  . Obesity, morbid, BMI 40.0-49.9 ([place]) 04/19/2018  . Cataract   . Fibroid   . Endometriosis   . DM (diabetes mellitus) type II controlled with renal manifestation ([place]) 06/17/2010  . Dyslipidemia 06/17/2010  . Microcytic anemia 06/17/2010  . Essential hypertension 06/17/2010  . Allergic rhinitis 06/17/2010  . Osteoarthritis 06/17/2010  . LOW BACK PAIN 06/17/2010  . OSTEOPENIA 06/17/2010  . BREAST CANCER, HX OF 06/17/2010   PCP:  Caren Macadam, MD Pharm[facility]   [id number] [place] [address]Phone: [phone]: [phone] [facility] [facility] [place]Phone: [phone]: 8[place] StreetWNashville[place] [address]Phone: [phone]: [phone]    Social Determinants of Health (SDOH) Interventions    Readmission Risk Interventions Readmission Risk Prevention Plan 09/16/2019  Transportation Screening Complete  PCP or Specialist Appt within 3-5 Days Complete  HRI or Home Care Consult Complete  Social Work Consult for [place]/Counseling Complete  Palliative Care Screening Not Applicable  Medication Review (Press photographer Complete  Some recent data might be hidden

## 2019-09-16 NOTE — Progress Notes (Signed)
PROGRESS NOTE    Doris Jackson  MIW:[id number] DOB: [birth year] DOA: 09/14/2019 PCP: Caren Macadam, MD    Brief Narrative:  Doris Jackson is a 79 year old female with a history of diabetes, hypertension, hyperlipidemia, presents to the emergency room with worsening numbness and tingling in her hands and feet bilaterally.  MRI of the brain and cervical spine was recently performed approximately 4 days prior to admission when she had visited the emergency room.  At that time, showed cord compression due to severe spinal stenosis.  She was advised for admission and surgery, but she refused at that time since she wanted to discuss this further with her family.  She returned to the hospital with worsening symptoms and was admitted to the hospital for operative management.  She was evaluated by neurosurgery and underwent surgical management on 3/18.   Assessment & Plan:   Principal Problem:   Spinal stenosis in cervical region Active Problems:   DM (diabetes mellitus) type II controlled with renal manifestation (HCC)   Dyslipidemia   Microcytic anemia   Essential hypertension   Obesity, morbid, BMI 40.0-49.9 (HCC)   AKI (acute kidney injury) (H[facility])   GERD (gastroesophageal reflux disease)   Numbness and tingling   Cervical spinal stenosis   Cervical spondylosis with myelopathy and radiculopathy   Cervical spinal stenosis with cord compression Patient presented with weakness and paresthesias to bilateral upper extremities, left greater than right.  Was noted to have significant cervical spinal stenosis with cord compression.  Evaluated by neurosurgery and underwent anterior cervical decompression with fusion by neurosurgery, Dr. Christella Noa on 09/15/2019. --Decadron 4 mg p.o. twice daily --Pending PT/OT/SLP evaluation --Further postoperative care per neurosurgery  Acute renal failure Creatinine 1.78 on admission (baseline 1.2), likely secondary to prerenal azotemia from poor oral intake in  the days preceding hospitalization.  Received IV fluid hydration with improvement of creatinine to 1.17 today. --Continue monitor renal function closely --Avoid nephrotoxins, renally dose all medications  Essential hypertension BP 155/83 this morning. --continue home amlodipine-benazepril 5-17m PO daily, HCTZ 264mPO daily, metoprolol succinate 5053mO daily  Type 2 diabetes mellitus On Januvia 100 mg p.o. daily, Actos 45 mg p.o. daily, Metformin 500 mg p.o. twice daily, glipizide 5 mg p.o. daily at home.  Hemoglobin A1c 7.0 on 08/16/2019, well controlled. --Glipizide and Actos --Insulin sliding scale for further coverage  GERD: Continue PPI  Microcytic anemia: Continue iron supplementation 150 mg p.o. daily  Hyperlipidemia: Continue statin   DVT prophylaxis: SCDs Code Status: Full code Family Communication: Updated patient spouse present at bedside Disposition Plan:      Patient is from: Home with spouse     Anticipated Disposition:  Home with likely home health services     Barriers to discharge or conditions that needs to be met prior to discharge: Awaiting PT/OT/SLP evaluation due to limited mobility, waiting neurosurgery signed off,  Consultants:   Neurosurgery - Dr. CabChristella Noarocedures:  Anterior Cervical decompression C3/4 C5 cervical corpectomy Arthrodesis C3-4with 9mm72mructural allograft Arthrodesis C4-6 24mm29mk cage with autograft morsels Anterior instrumentation(Nuvasive ACP) C3-6  Antimicrobials:   none   Subjective: Seen and examined bedside, resting comfortably.  Continues with some paresthesias to left upper extremity, right upper extremity paresthesias improved.  Continues with mild weakness.  No other specific complaints at this time.  Awaiting therapy evaluation.  Denies headache, no fever/chills/night sweats, no nausea/vomiting/diarrhea, no chest pain, no palpitations, no shortness of breath, no abdominal pain.  No acute events overnight per  nursing  staff.  Objective: Vitals:   09/15/19 2131 09/15/19 2353 09/16/19 0413 09/16/19 0817  BP: 130/67 (!) 154/82 (!) 155/83 125/71  Pulse: 88 86 87 83  Resp: '15 15 18 18  ' Temp: (!) 97.5 F (36.4 C) (!) 97.5 F (36.4 C) 98.5 F (36.9 C) 98 F (36.7 C)  TempSrc: Oral Oral Oral Oral  SpO2: 100% 99% 96% 96%  Weight:      Height:        Intake/Output Summary (Last 24 hours) at 09/16/2019 1200 Last data filed at 09/16/2019 1024 Gross per 24 hour  Intake 2767.08 ml  Output 1725 ml  Net 1042.08 ml   Filed Weights   09/15/19 1434  Weight: 99.8 kg    Examination:  General exam: Appears calm and comfortable  Respiratory system: Clear to auscultation. Respiratory effort normal. Cardiovascular system: S1 & S2 heard, RRR. No JVD, murmurs, rubs, gallops or clicks. No pedal edema. Gastrointestinal system: Abdomen is nondistended, soft and nontender. No organomegaly or masses felt. Normal bowel sounds heard. Central nervous system: Alert and oriented. No focal neurological deficits. Extremities: Symmetric 5 x 5 power. Skin: No rashes, lesions or ulcers, noted anterior neck surgical incision wound, clean/dry/intact without erythema/fluctuance Psychiatry: Judgement and insight appear normal. Mood & affect appropriate.     Data Reviewed: I have personally reviewed following labs and imaging studies  CBC: Recent Labs  Lab 09/10/19 1529 09/14/19 1307 09/15/19 0328 09/16/19 0248  WBC 6.9 10.0 9.7 11.4*  HGB 11.8* 11.6* 10.6* 8.8*  HCT 35.5* 34.8* 31.5* 26.0*  MCV 79.8* 78.9* 79.5* 78.8*  PLT 164 167 155 300*   Basic Metabolic Panel: Recent Labs  Lab 09/10/19 1529 09/14/19 1307 09/14/19 1422 09/15/19 0328 09/16/19 0248 09/16/19 0422  NA 139 138  --  143 143 141  K 3.4* 4.1  --  3.8 7.0* 4.9  CL 99 100  --  106 114* 105  CO2 26 23  --  25 20* 24  GLUCOSE 145* 247*  --  144* 191* 221*  BUN 24* 65*  --  50* 28* 34*  CREATININE 1.35* 1.78*  --  1.51* 1.03* 1.17*  CALCIUM 9.4  9.1  --  8.9 7.3* 8.5*  MG  --   --  1.8  --   --   --    GFR: Estimated Creatinine Clearance: 44.5 mL/min (A) (by C-G formula based on SCr of 1.17 mg/dL (H)). Liver Function Tests: Recent Labs  Lab 09/14/19 1307  AST 14*  ALT 14  ALKPHOS 62  BILITOT 0.5  PROT 7.4  ALBUMIN 3.5   No results for input(s): LIPASE, AMYLASE in the last 168 hours. No results for input(s): AMMONIA in the last 168 hours. Coagulation Profile: Recent Labs  Lab 09/15/19 1118  INR 1.0   Cardiac Enzymes: No results for input(s): CKTOTAL, CKMB, CKMBINDEX, TROPONINI in the last 168 hours. BNP (last 3 results) No results for input(s): PROBNP in the last 8760 hours. HbA1C: No results for input(s): HGBA1C in the last 72 hours. CBG: Recent Labs  Lab 09/15/19 1440 09/15/19 2029 09/15/19 2137 09/16/19 0637 09/16/19 1130  GLUCAP 157* 274* 257* 191* 259*   Lipid Profile: No results for input(s): CHOL, HDL, LDLCALC, TRIG, CHOLHDL, LDLDIRECT in the last 72 hours. Thyroid Function Tests: No results for input(s): TSH, T4TOTAL, FREET4, T3FREE, THYROIDAB in the last 72 hours. Anemia Panel: Recent Labs    09/14/19 1856  FERRITIN 92  TIBC 308  IRON 46   Sepsis  Labs: No results for input(s): PROCALCITON, LATICACIDVEN in the last 168 hours.  Recent Results (from the past 240 hour(s))  SARS CORONAVIRUS 2 (TAT 6-24 HRS) Nasopharyngeal Nasopharyngeal Swab     Status: None   Collection Time: 09/14/19  2:13 PM   Specimen: Nasopharyngeal Swab  Result Value Ref Range Status   SARS Coronavirus 2 NEGATIVE NEGATIVE Final    Comment: (NOTE) SARS-CoV-2 target nucleic acids are NOT DETECTED. The SARS-CoV-2 RNA is generally detectable in upper and lower respiratory specimens during the acute phase of infection. Negative results do not preclude SARS-CoV-2 infection, do not rule out co-infections with other pathogens, and should not be used as the sole basis for treatment or other patient management  decisions. Negative results must be combined with clinical observations, patient history, and epidemiological information. The expected result is Negative. Fact Sheet for Patients: SugarRoll.be Fact Sheet for Healthcare Providers: https://www.woods-mathews.com/ This test is not yet approved or cleared by the [place] FDA and  has been authorized for detection and/or diagnosis of SARS-CoV-2 by FDA under an Emergency Use Authorization (EUA). This EUA will remain  in effect (meaning this test can be used) for the duration of the COVID-19 declaration under Section 56 4(b)(1) of the Act, 21 U.S.C. section 360bbb-3(b)(1), unless the authorization is terminated or revoked sooner. Performed at [facility], [place] [address], [place], [place] [address]   Surgical pcr screen     Status: None   Collection Time: 09/15/19 10:34 AM   Specimen: Nasal Mucosa; Nasal Swab  Result Value Ref Range Status   MRSA, PCR NEGATIVE NEGATIVE Final   Staphylococcus aureus NEGATIVE NEGATIVE Final    Comment: (NOTE) The Xpert SA Assay (FDA approved for NASAL specimens in patients 60 years of age and older), is one component of a comprehensive surveillance program. It is not intended to diagnose infection nor to guide or monitor treatment. Performed at [facility], [place] [address], [place], [place] [address]          Radiology Studies: DG Chest 2 View  Result Date: 09/15/2019 CLINICAL DATA:  Diabetes, shortness of breath EXAM: CHEST - 2 VIEW COMPARISON:  04/19/2019 FINDINGS: Cardiomegaly, vascular congestion. No confluent opacities or edema. No effusions. Calcified granuloma at the left lung base. IMPRESSION: Cardiomegaly, vascular congestion. Calcified left base granuloma. No acute findings. Electronically Signed   By: Rolm Baptise M.D.   On: 09/15/2019 10:59   DG Cervical Spine 2-3 Views  Result Date: 09/15/2019 CLINICAL DATA:  Cervical corpectomy  and fusion. EXAM: CERVICAL SPINE - 2-3 VIEW COMPARISON:  August 24, 2019 FINDINGS: Three portable cross-table lateral films demonstrate interval ACDF from C3 to C4 with interbody disc graft placement. The surgical hardware plate extends distally. This more distal hardware and the more distal cervical spine are obscured by superimposition of the shoulder soft tissues. The C2-3 alignment is normal. IMPRESSION: Intraoperative cross-table lateral films demonstrates interval ACDF from C3 to C4 with interbody disc graft placement, the more surgical hardware plate extending distally. The more distal cervical spine and post-operative changes obscured by superimposition of the shoulder soft tissues. Electronically Signed   By: Revonda Humphrey   On: 09/15/2019 21:57        Scheduled Meds: . amLODipine  5 mg Oral Daily   And  . benazepril  40 mg Oral Daily  . dexamethasone (DECADRON) injection  4 mg Intravenous Q12H   Or  . dexamethasone  4 mg Oral Q12H  . glipiZIDE  5  mg Oral Daily  . hydrochlorothiazide  25 mg Oral Daily  . HYDROcodone-acetaminophen  1 tablet Oral Q6H  . insulin aspart  0-15 Units Subcutaneous TID WC  . insulin aspart  0-5 Units Subcutaneous QHS  . iron polysaccharides  150 mg Oral Daily  . loratadine  10 mg Oral Daily  . metoprolol succinate  50 mg Oral Daily  . pantoprazole  40 mg Oral QHS  . pioglitazone  45 mg Oral Daily  . pravastatin  40 mg Oral Daily  . prednisoLONE acetate  1 drop Left Eye Q6H   And  . prednisoLONE acetate  1 drop Right Eye BID  . senna  1 tablet Oral BID  . sodium chloride flush  3 mL Intravenous Q12H   Continuous Infusions: . sodium chloride 100 mL/hr at 09/15/19 0337  . sodium chloride    . 0.9 % NaCl with KCl 20 mEq / L Stopped (09/16/19 0406)     LOS: 2 days    Time spent: 35 minutes spent on chart review, discussion with nursing staff, consultants, updating family and interview/physical exam; more than 50% of that time was spent in  counseling and/or coordination of care.    Doris Jackson [place] DO [facility] Available via Epic secure chat 7am-7pm After these hours, please refer to coverage provider listed on [email or web address] 09/16/2019, 12:00 PM

## 2019-09-16 NOTE — Progress Notes (Signed)
Lab notified nurse of Potassium level of 7.0 Lab drawn from LUE with 0.9% NaCl with KCL 20 mEq/L, restricted Right upper extremity. IV fluids stopped.  [place] on call notified New order for BMP

## 2019-09-16 NOTE — Plan of Care (Signed)

## 2019-09-16 NOTE — Progress Notes (Addendum)
Occupational Therapy Treatment Patient Details Name: Doris Jackson MRN: [id number] DOB: 09-29-1940 Today's Date: 09/16/2019    History of present illness Doris Jackson is a 79 y.o. female with medical history significant of HTN, hyperlipidemia, DM, seasonal allergies, left eye corneal transplant, microcytic anemia, presents to emergency department due to worsening of numbness tingling sensation in both hands and feet. MRI brain and cervical spine was obtained which showed cord compression due to severe spinal stenosis. S/p Anterior Cervical decompression C3-4 (3/18)   OT comments  Pt with decline in function and safety with ADLs and ADL mobility. Pt lives at home with he husband and up until 3 weeks ago, she was independent with ADLs/selfcare and used a Marketing executive for mobility. Pt current requires max A with LB ADLs, mod A with toileting, min A with mobility using RW, now wearing a cervical hard collar with cervical precautions. Pt would benefit from acute OT services to address impairments to maximize level of function and safety  Follow Up Recommendations  CIR   Equipment Recommendations  Other (comment);3 in 1 bedside commode    Recommendations for Other Services      Precautions / Restrictions Precautions Precautions: Cervical;Fall Precaution Booklet Issued: Yes (comment) Precaution Comments: reviewd cervial precautions with pt and husband with handout provided Required Braces or Orthoses: Cervical Brace Cervical Brace: Hard collar Restrictions Weight Bearing Restrictions: No       Mobility Bed Mobility Overal bed mobility: Needs Assistance Bed Mobility: Supine to Sit     Supine to sit: Mod assist     General bed mobility comments: pt in recliner upon arrival  Transfers Overall transfer level: Needs assistance Equipment used: Rolling walker (2 wheeled) Transfers: Sit to/from [facility] Sit to Stand: Min assist Stand pivot transfers: Min assist        General transfer comment: requires momentum to power up from recliner/BSC to RW    Balance Overall balance assessment: Needs assistance Sitting-balance support: Single extremity supported;Feet supported Sitting balance-Leahy Scale: Good     Standing balance support: Bilateral upper extremity supported;During functional activity Standing balance-Leahy Scale: Poor                             ADL either performed or assessed with clinical judgement   ADL Overall ADL's : Needs assistance/impaired Eating/Feeding: Set up;Sitting   Grooming: Wash/dry hands;Wash/dry face;Minimal assistance;Standing   Upper Body Bathing: Minimal assistance;Sitting   Lower Body Bathing: Maximal assistance;Sit to/from stand;Cueing for safety;Cueing for compensatory techniques;Cueing for sequencing   Upper Body Dressing : Minimal assistance;Sitting   Lower Body Dressing: Maximal assistance;Sit to/from stand;Cueing for safety;Cueing for compensatory techniques;Cueing for sequencing   Toilet Transfer: Minimal assistance;Ambulation;RW;Stand-pivot;Cueing for safety;Cueing for sequencing   Toileting- Clothing Manipulation and Hygiene: Maximal assistance;Sit to/from stand;Cueing for compensatory techniques;Cueing for safety;Cueing for sequencing       Functional mobility during ADLs: Minimal assistance;Rolling walker;Cueing for safety;Cueing for sequencing       Vision Baseline Vision/History: (hx of vision impaired in L eye) Patient Visual Report: No change from baseline     Perception     Praxis      Cognition Arousal/Alertness: Awake/alert Behavior During Therapy: WFL for tasks assessed/performed Overall Cognitive Status: Within Functional Limits for tasks assessed  Exercises     Shoulder Instructions       General Comments Peripheral vision in L eye is limited, pt unable to see incentive spirometer on the L side of  table. Reports numbness in L UE and LE but sensation was intact (L<R).     Pertinent Vitals/ Pain       Pain Assessment: 0-10 Pain Score: 5  Pain Location: incision, neck Pain Descriptors / Indicators: Aching;Discomfort;Operative site guarding Pain Intervention(s): Monitored during session;RN gave pain meds during session;Repositioned  Home Living Family/patient expects to be discharged to:: Private residence Living Arrangements: Spouse/significant other Available Help at Discharge: Family Type of Home: House Home Access: Stairs to enter [employer] of Steps: 2 at the back door (preference), 4 at the front Entrance Stairs-Rails: Can reach both Home Layout: One level     Bathroom Shower/Tub: Teacher, early years/pre: Standard     Home Equipment: Environmental consultant - 4 wheels   Additional Comments: Has been using Rollator for ~3 weeks      Prior Functioning/Environment Level of Independence: Independent        Comments: pt was I until approx 3 weeks ago. Husband has been assisting with ADL activity   Frequency  Min 2X/week        Progress Toward Goals  OT Goals(current goals can now be found in the care plan section)     Acute Rehab OT Goals Patient Stated Goal: get well OT Goal Formulation: With patient/family Time For Goal Achievement: 09/30/19 Potential to Achieve Goals: Good ADL Goals Pt Will Perform Grooming: with min guard assist;standing;with caregiver independent in assisting Pt Will Perform Upper Body Bathing: with min guard assist;with supervision;with set-up;with caregiver independent in assisting Pt Will Perform Lower Body Bathing: with mod assist;with adaptive equipment;with caregiver independent in assisting Pt Will Perform Upper Body Dressing: with min guard assist;with supervision;with set-up;sitting;with caregiver independent in assisting Pt Will Perform Lower Body Dressing: with mod assist;with adaptive equipment;with caregiver  independent in assisting Pt Will Transfer to Toilet: with min guard assist;ambulating Pt Will Perform Toileting - Clothing Manipulation and hygiene: with min assist;with caregiver independent in assisting Additional ADL Goal #1: Pt will recall cervical precautions and maintain during ADLs and ADL mobility  Plan      Co-evaluation                 AM-PAC OT "6 Clicks" Daily Activity     Outcome Measure   Help from another person eating meals?: A Little Help from another person taking care of personal grooming?: A Little Help from another person toileting, which includes using toliet, bedpan, or urinal?: A Lot Help from another person bathing (including washing, rinsing, drying)?: A Little Help from another person to put on and taking off regular upper body clothing?: A Lot Help from another person to put on and taking off regular lower body clothing?: A Lot 6 Click Score: 15    End of Session Equipment Utilized During Treatment: Rolling walker;Gait belt;Other (comment)(BSC)  OT Visit Diagnosis: Unsteadiness on feet (R26.81);Muscle weakness (generalized) (M62.81);History of falling (Z91.81)   Activity Tolerance Patient limited by fatigue   Patient Left in chair;with call bell/phone within reach;with chair alarm set;with family/visitor present   Nurse Communication          Time: [id number] OT Time Calculation (min): 26 min  Charges: OT General Charges $OT Visit: 1 Visit OT Evaluation $OT Re-eval: 1 Re-eval OT Treatments $Self Care/Home Management : 8-22 mins  Emmit Alexanders [facility] 09/16/2019, 12:37 PM

## 2019-09-16 NOTE — Evaluation (Signed)
Physical Therapy Evaluation Patient Details Name: Doris Jackson MRN: [id number] DOB: 02-10-1941 Today's Date: 09/16/2019   History of Present Illness  Doris Jackson is a 79 y.o. female with medical history significant of HTN, hyperlipidemia, DM, seasonal allergies, left eye corneal transplant, microcytic anemia, presents to emergency department due to worsening of numbness tingling sensation in both hands and feet. MRI brain and cervical spine was obtained which showed cord compression due to severe spinal stenosis. S/p Anterior Cervical decompression C3-4 (3/18)    Clinical Impression  Patient presented sitting in bed, awake, and willing to participate in therapy. PTA, pt was independent in all ADL's. Pt lives with her husband in a single story home with 2 steps to enter at the backdoor. She was previously assisting with ADL care for her daughter (grocery store, driving, etc.), her husband has now been helping their daughter with her needs. At the time of evaluation, assisted with cervical collar donning prior to bed mobility/transfers. Verbal education about cervical collar and precautions was provided. Pt had difficulty sequencing log roll, requiring mod A and verbal cues. Pt was initially dizzy sitting EOB, but resolved with time. She demonstrated impaired grip strength L (dominant hand) and LE weakness L>R with decreased sensation L>R. She required min A and rocking for momentum to transfer sit to stand for safety with RW as pt's L peripheral vision and L sensation are impaired. She demonstrated difficulty with foot clearance, requiring a shuffled gait pattern to pivot to the chair. Pt stated difficulty/pain with swallowing throughout session. Recommend CIR for further therapy services in order to maximize functional mobility, independence, and safety. Pt was agreeable to recommendation.  Pt would continue to benefit from skilled physical therapy services at this time while admitted and after d/c to  address the below listed limitations in order to improve overall safety and independence with functional mobility.      Follow Up Recommendations CIR    Equipment Recommendations       Recommendations for Other Services Speech consult     Precautions / Restrictions Precautions Precautions: Cervical;Fall Precaution Booklet Issued: No Required Braces or Orthoses: Cervical Brace Cervical Brace: Hard collar Restrictions Weight Bearing Restrictions: No      Mobility  Bed Mobility Overal bed mobility: Needs Assistance Bed Mobility: Supine to Sit     Supine to sit: Mod assist     General bed mobility comments: pt had difficulty sequencing log roll, requiring mod A  Transfers Overall transfer level: Needs assistance Equipment used: Rolling walker (2 wheeled) Transfers: Sit to/from [facility] Sit to Stand: Min assist Stand pivot transfers: Min assist;From elevated surface       General transfer comment: Pt requires rocking/momentum and min A for power up. Pt unable to fully lift feet off floor for ambulation- demonstrates shuffling to pivot into chair  Ambulation/Gait                Stairs            Wheelchair Mobility    Modified Rankin (Stroke Patients Only)       Balance Overall balance assessment: Needs assistance Sitting-balance support: Single extremity supported;Feet supported Sitting balance-Leahy Scale: Good     Standing balance support: Bilateral upper extremity supported;During functional activity Standing balance-Leahy Scale: Poor                               Pertinent Vitals/Pain Pain Assessment: 0-10 Pain  Score: 5  Pain Location: incision, neck Pain Descriptors / Indicators: Aching;Discomfort;Operative site guarding Pain Intervention(s): Monitored during session;Repositioned    Home Living Family/patient expects to be discharged to:: Private residence Living Arrangements: Spouse/significant  other Available Help at Discharge: Family Type of Home: House Home Access: Stairs to enter Entrance Stairs-Rails: Can reach both Entrance Stairs-Number of Steps: 2 at the back door (preference), 4 at the front Home Layout: One level Home Equipment: Walker - 4 wheels Additional Comments: Has been using Rollator for ~3 weeks    Prior Function Level of Independence: Independent         Comments: pt was I until approx 3 weeks ago. Husband has been assisting with ADL activity     Hand Dominance   Dominant Hand: Left    Extremity/Trunk Assessment   Upper Extremity Assessment Upper Extremity Assessment: Defer to OT evaluation    Lower Extremity Assessment Lower Extremity Assessment: RLE deficits/detail;LLE deficits/detail RLE Deficits / Details: ROM: WNL globally. Strength 4/5  (hip flex, hip abd/add. knee flex/ext, DF) RLE Sensation: decreased light touch RLE Coordination: WNL LLE Deficits / Details: ROM: WNL globally. Strength 3+/5  (hip flex, hip abd/add. knee flex/ext, DF) LLE Sensation: decreased light touch LLE Coordination: WNL       Communication   Communication: No difficulties  Cognition Arousal/Alertness: Awake/alert Behavior During Therapy: WFL for tasks assessed/performed Overall Cognitive Status: Within Functional Limits for tasks assessed                                        General Comments General comments (skin integrity, edema, etc.): Peripheral vision in L eye is limited, pt unable to see incentive spirometer on the L side of table. Reports numbness in L UE and LE but sensation was intact (L<R).     Exercises     Assessment/Plan    PT Assessment Patient needs continued PT services  PT Problem List Decreased strength;Decreased activity tolerance;Decreased balance;Decreased mobility;Decreased coordination;Decreased knowledge of use of DME;Decreased knowledge of precautions       PT Treatment Interventions DME instruction;Gait  training;Stair training;Functional mobility training;Therapeutic activities;Therapeutic exercise;Balance training    PT Goals (Current goals can be found in the Care Plan section)  Acute Rehab PT Goals Patient Stated Goal: get well PT Goal Formulation: With patient Time For Goal Achievement: 09/30/19 Potential to Achieve Goals: Good    Frequency Min 5X/week   Barriers to discharge        Co-evaluation               AM-PAC PT "6 Clicks" Mobility  Outcome Measure Help needed turning from your back to your side while in a flat bed without using bedrails?: A Lot Help needed moving from lying on your back to sitting on the side of a flat bed without using bedrails?: A Little Help needed moving to and from a bed to a chair (including a wheelchair)?: A Lot Help needed standing up from a chair using your arms (e.g., wheelchair or bedside chair)?: A Little Help needed to walk in hospital room?: A Lot Help needed climbing 3-5 steps with a railing? : A Lot 6 Click Score: 14    End of Session Equipment Utilized During Treatment: Gait belt;Cervical collar Activity Tolerance: Patient tolerated treatment well Patient left: in chair;with call bell/phone within reach;with chair alarm set;with family/visitor present Nurse Communication: Mobility status PT Visit Diagnosis: Unsteadiness on  feet (R26.81);Other abnormalities of gait and mobility (R26.89);Muscle weakness (generalized) (M62.81)    Time: [id number] PT Time Calculation (min) (ACUTE ONLY): 30 min   Charges:              Dara Hoyer, SPT Acute Rehab  [id number]   Doris Jackson 09/16/2019, 12:11 PM

## 2019-09-16 NOTE — Progress Notes (Signed)
PT Progress Note for Charges    09/16/19 1615  PT Visit Information  Last PT Received On 09/16/19  PT General Charges  $$ ACUTE PT VISIT 1 Visit  PT Evaluation  $PT Eval Moderate Complexity 1 Mod  PT Treatments  $Therapeutic Activity 8-22 mins  Anastasio Champion, DPT  Acute Rehabilitation Services Pager [phone] Office [phone]

## 2019-09-17 LAB — BASIC METABOLIC PANEL
Anion gap: 11 (ref 5–15)
BUN: 29 mg/dL — ABNORMAL HIGH (ref 8–23)
CO2: 26 mmol/L (ref 22–32)
Calcium: 8.5 mg/dL — ABNORMAL LOW (ref 8.9–10.3)
Chloride: 102 mmol/L (ref 98–111)
Creatinine, Ser: 1.18 mg/dL — ABNORMAL HIGH (ref 0.44–1.00)
GFR calc Af Amer: 52 mL/min — ABNORMAL LOW (ref >60)
GFR calc non Af Amer: 44 mL/min — ABNORMAL LOW (ref >60)
Glucose, Bld: 169 mg/dL — ABNORMAL HIGH (ref 70–99)
Potassium: 4.2 mmol/L (ref 3.5–5.1)
Sodium: 139 mmol/L (ref 135–145)

## 2019-09-17 LAB — GLUCOSE, CAPILLARY
Glucose-Capillary: 160 mg/dL — ABNORMAL HIGH (ref 70–99)
Glucose-Capillary: 219 mg/dL — ABNORMAL HIGH (ref 70–99)
Glucose-Capillary: 371 mg/dL — ABNORMAL HIGH (ref 70–99)

## 2019-09-17 NOTE — Plan of Care (Signed)

## 2019-09-17 NOTE — Progress Notes (Signed)
PROGRESS NOTE    Doris Jackson  QMG:[id number] DOB: September 01, 1940 DOA: 09/14/2019 PCP: Caren Macadam, MD    Brief Narrative:  Doris Jackson is a 79 year old female with a history of diabetes, hypertension, hyperlipidemia, presents to the emergency room with worsening numbness and tingling in her hands and feet bilaterally.  MRI of the brain and cervical spine was recently performed approximately 4 days prior to admission when she had visited the emergency room.  At that time, showed cord compression due to severe spinal stenosis.  She was advised for admission and surgery, but she refused at that time since she wanted to discuss this further with her family.  She returned to the hospital with worsening symptoms and was admitted to the hospital for operative management.  She was evaluated by neurosurgery and underwent surgical management on 3/18.   Assessment & Plan:   Principal Problem:   Spinal stenosis in cervical region Active Problems:   DM (diabetes mellitus) type II controlled with renal manifestation (HCC)   Dyslipidemia   Microcytic anemia   Essential hypertension   Obesity, morbid, BMI 40.0-49.9 (HCC)   AKI (acute kidney injury) (HCC)   GERD (gastroesophageal reflux disease)   Numbness and tingling   Cervical spinal stenosis   Cervical spondylosis with myelopathy and radiculopathy   Cervical spinal stenosis with cord compression Patient presented with weakness and paresthesias to bilateral upper extremities, left greater than right.  Was noted to have significant cervical spinal stenosis with cord compression.  Evaluated by neurosurgery and underwent anterior cervical decompression with fusion by neurosurgery, Dr. Christella Noa on 09/15/2019.  Completed 2-day course of Decadron per neurosurgery.  PT and OT recommend CIR placement to obtain independence back to her previous baseline; currently requiring max assist with much of her ADLs currently in which she was very independent at  baseline prior to surgical intervention. --Further postoperative care per neurosurgery --Pending rehab MD/CIR consultation  Acute renal failure: resolved Creatinine 1.78 on admission (baseline 1.2), likely secondary to prerenal azotemia from poor oral intake in the days preceding hospitalization.  Received IV fluid hydration with improvement of creatinine to 1.18 today. --Continue monitor renal function closely --Avoid nephrotoxins, renally dose all medications  Essential hypertension BP 143/74 this morning. --continue home amlodipine-benazepril 5-34m PO daily, HCTZ 248mPO daily, metoprolol succinate 5032mO daily  Type 2 diabetes mellitus On Januvia 100 mg p.o. daily, Actos 45 mg p.o. daily, Metformin 500 mg p.o. twice daily, glipizide 5 mg p.o. daily at home.  Hemoglobin A1c 7.0 on 08/16/2019, well controlled. --Glipizide and Actos --Insulin sliding scale for further coverage  GERD: Continue PPI  Microcytic anemia: Continue iron supplementation 150 mg p.o. daily  Hyperlipidemia: Continue statin   DVT prophylaxis: SCDs Code Status: Full code Family Communication: Updated patient at bedside Disposition Plan:      Patient is from: Home with spouse     Anticipated Disposition:  CIR     Barriers to discharge or conditions that needs to be met prior to discharge: pending CIR evaluation,  neurosurgery sign off  Consultants:   Neurosurgery - Dr. CabChristella Noarocedures:  Anterior Cervical decompression C3/4 C5 cervical corpectomy Arthrodesis C3-4with 9mm25mructural allograft Arthrodesis C4-6 24mm59mk cage with autograft morsels Anterior instrumentation(Nuvasive ACP) C3-6  Antimicrobials:   none   Subjective: Seen and examined bedside, resting comfortably.  Continues with mild weakness remedies with some paresthesias.  No other specific complaints at this time.  Therapy evaluation with PT/OT yesterday with max assist with  multiple areas of ADLs, which is different than her  normal baseline of independence.  Denies headache, no fever/chills/night sweats, no nausea/vomiting/diarrhea, no chest pain, no palpitations, no shortness of breath, no abdominal pain.  No acute events overnight per nursing staff.  Objective: Vitals:   09/16/19 2017 09/17/19 0352 09/17/19 0754 09/17/19 1249  BP: 132/70 (!) 143/74 124/89 (!) 142/86  Pulse: 74 74 84 86  Resp:  _0 Temp: 97.8 F (36.6 C) 98.1 F (36.7 C) 97.8 F (36.6 C) 98.3 F (36.8 C)  TempSrc: Oral Oral Oral Oral  SpO2: 98% 100% 97% 100%  Weight:      Height:        Intake/Output Summary (Last 24 hours) at 09/17/2019 1330 Last data filed at 09/17/2019 0900 Gross per 24 hour  Intake 483 ml  Output --  Net 483 ml   Filed Weights   09/15/19 1434  Weight: 99.8 kg    Examination:  General exam: Appears calm and comfortable  Respiratory system: Clear to auscultation. Respiratory effort normal. Cardiovascular system: S1 & S2 heard, RRR. No JVD, murmurs, rubs, gallops or clicks. No pedal edema. Gastrointestinal system: Abdomen is nondistended, soft and nontender. No organomegaly or masses felt. Normal bowel sounds heard. Central nervous system: Alert and oriented. No focal neurological deficits. Extremities: Symmetric 5 x 5 power. Skin: No rashes, lesions or ulcers, noted anterior neck surgical incision wound, clean/dry/intact without erythema/fluctuance Psychiatry: Judgement and insight appear normal. Mood & affect appropriate.     Data Reviewed: I have personally reviewed following labs and imaging studies  CBC: Recent Labs  Lab 09/10/19 1529 09/14/19 1307 09/15/19 0328 09/16/19 0248  WBC 6.9 10.0 9.7 11.4*  HGB 11.8* 11.6* 10.6* 8.8*  HCT 35.5* 34.8* 31.5* 26.0*  MCV 79.8* 78.9* 79.5* 78.8*  PLT 164 167 155 409*   Basic Metabolic Panel: Recent Labs  Lab 09/14/19 1307 09/14/19 1422 09/15/19 0328 09/16/19 0248 09/16/19 0422 09/17/19 0346  NA 138  --  143 143 141 139  K 4.1  --  3.8  7.0* 4.9 4.2  CL 100  --  106 114* 105 102  CO2 23  --  25 20* 24 26  GLUCOSE 247*  --  144* 191* 221* 169*  BUN 65*  --  50* 28* 34* 29*  CREATININE 1.78*  --  1.51* 1.03* 1.17* 1.18*  CALCIUM 9.1  --  8.9 7.3* 8.5* 8.5*  MG  --  1.8  --   --   --   --    GFR: Estimated Creatinine Clearance: 44.1 mL/min (A) (by C-G formula based on SCr of 1.18 mg/dL (H)). Liver Function Tests: Recent Labs  Lab 09/14/19 1307  AST 14*  ALT 14  ALKPHOS 62  BILITOT 0.5  PROT 7.4  ALBUMIN 3.5   No results for input(s): LIPASE, AMYLASE in the last 168 hours. No results for input(s): AMMONIA in the last 168 hours. Coagulation Profile: Recent Labs  Lab 09/15/19 1118  INR 1.0   Cardiac Enzymes: No results for input(s): CKTOTAL, CKMB, CKMBINDEX, TROPONINI in the last 168 hours. BNP (last 3 results) No results for input(s): PROBNP in the last 8760 hours. HbA1C: No results for input(s): HGBA1C in the last 72 hours. CBG: Recent Labs  Lab 09/16/19 1130 09/16/19 1611 09/16/19 2135 09/17/19 0633 09/17/19 1154  GLUCAP 259* 179* 207* 160* 219*   Lipid Profile: No results for input(s): CHOL, HDL, LDLCALC, TRIG, CHOLHDL, LDLDIRECT in the last 72 hours. Thyroid Function  Tests: No results for input(s): TSH, T4TOTAL, FREET4, T3FREE, THYROIDAB in the last 72 hours. Anemia Panel: Recent Labs    09/14/19 1856  FERRITIN 92  TIBC 308  IRON 46   Sepsis Labs: No results for input(s): PROCALCITON, LATICACIDVEN in the last 168 hours.  Recent Results (from the past 240 hour(s))  SARS CORONAVIRUS 2 (TAT 6-24 HRS) Nasopharyngeal Nasopharyngeal Swab     Status: None   Collection Time: 09/14/19  2:13 PM   Specimen: Nasopharyngeal Swab  Result Value Ref Range Status   SARS Coronavirus 2 NEGATIVE NEGATIVE Final    Comment: (NOTE) SARS-CoV-2 target nucleic acids are NOT DETECTED. The SARS-CoV-2 RNA is generally detectable in upper and lower respiratory specimens during the acute phase of infection.  Negative results do not preclude SARS-CoV-2 infection, do not rule out co-infections with other pathogens, and should not be used as the sole basis for treatment or other patient management decisions. Negative results must be combined with clinical observations, patient history, and epidemiological information. The expected result is Negative. Fact Sheet for Patients: SugarRoll.be Fact Sheet for Healthcare Providers: https://www.woods-mathews.com/ This test is not yet approved or cleared by the [place] FDA and  has been authorized for detection and/or diagnosis of SARS-CoV-2 by FDA under an Emergency Use Authorization (EUA). This EUA will remain  in effect (meaning this test can be used) for the duration of the COVID-19 declaration under Section 56 4(b)(1) of the Act, 21 U.S.C. section 360bbb-3(b)(1), unless the authorization is terminated or revoked sooner. Performed at [facility], [place] [address], [place], [place] [address]   Surgical pcr screen     Status: None   Collection Time: 09/15/19 10:34 AM   Specimen: Nasal Mucosa; Nasal Swab  Result Value Ref Range Status   MRSA, PCR NEGATIVE NEGATIVE Final   Staphylococcus aureus NEGATIVE NEGATIVE Final    Comment: (NOTE) The Xpert SA Assay (FDA approved for NASAL specimens in patients 25 years of age and older), is one component of a comprehensive surveillance program. It is not intended to diagnose infection nor to guide or monitor treatment. Performed at [facility], [place] [address], [place], [place] [address]          Radiology Studies: DG Cervical Spine 2-3 Views  Result Date: 09/15/2019 CLINICAL DATA:  Cervical corpectomy and fusion. EXAM: CERVICAL SPINE - 2-3 VIEW COMPARISON:  August 24, 2019 FINDINGS: Three portable cross-table lateral films demonstrate interval ACDF from C3 to C4 with interbody disc graft placement. The surgical hardware plate extends  distally. This more distal hardware and the more distal cervical spine are obscured by superimposition of the shoulder soft tissues. The C2-3 alignment is normal. IMPRESSION: Intraoperative cross-table lateral films demonstrates interval ACDF from C3 to C4 with interbody disc graft placement, the more surgical hardware plate extending distally. The more distal cervical spine and post-operative changes obscured by superimposition of the shoulder soft tissues. Electronically Signed   By: Revonda Humphrey   On: 09/15/2019 21:57        Scheduled Meds: . amLODipine  5 mg Oral Daily   And  . benazepril  40 mg Oral Daily  . glipiZIDE  5 mg Oral Daily  . hydrochlorothiazide  25 mg Oral Daily  . HYDROcodone-acetaminophen  1 tablet Oral Q6H  . insulin aspart  0-15 Units Subcutaneous TID WC  . insulin aspart  0-5 Units Subcutaneous QHS  . iron polysaccharides  150 mg Oral Daily  . loratadine  10 mg  Oral Daily  . metoprolol succinate  50 mg Oral Daily  . pantoprazole  40 mg Oral QHS  . pioglitazone  45 mg Oral Daily  . pravastatin  40 mg Oral Daily  . prednisoLONE acetate  1 drop Left Eye Q6H   And  . prednisoLONE acetate  1 drop Right Eye BID  . senna  1 tablet Oral BID  . sodium chloride flush  3 mL Intravenous Q12H   Continuous Infusions: . sodium chloride       LOS: 3 days    Time spent: 34 minutes spent on chart review, discussion with nursing staff, consultants, updating family and interview/physical exam; more than 50% of that time was spent in counseling and/or coordination of care.    Talea Manges J [place] DO [facility] Available via Epic secure chat 7am-7pm After these hours, please refer to coverage provider listed on [email or web address] 09/17/2019, 1:30 PM

## 2019-09-17 NOTE — Progress Notes (Signed)
Patient ID: Doris Jackson, female   DOB: Jan 20, 1941, 79 y.o.   MRN: [id number] BP (!) 142/86 (BP Location: Left Arm)   Pulse 86   Temp 98.3 F (36.8 C) (Oral)   Resp 18   Ht 5' 2.01" (1.575 m)   Wt 99.8 kg   SpO2 100%   BMI 40.23 kg/m  Alert and oriented x 4 Speech is clear and fluent Moving all extremities well Awaiting CIR placement Voice is strong, wound is clean

## 2019-09-17 NOTE — Progress Notes (Signed)
Physical Therapy Treatment Patient Details Name: Doris Jackson MRN: [id number] DOB: Oct 16, 1940 Today's Date: 09/17/2019    History of Present Illness Doris Jackson is a 79 y.o. female with medical history significant of HTN, hyperlipidemia, DM, seasonal allergies, left eye corneal transplant, microcytic anemia, presents to emergency department due to worsening of numbness tingling sensation in both hands and feet. MRI brain and cervical spine was obtained which showed cord compression due to severe spinal stenosis. S/p Anterior Cervical decompression C3-4 (3/18)    PT Comments    Pt in bed upon arrival of PT, agreeable to participate in PT session with focus on progressing mobility and ambulation endurance. The pt continues to present with limitations in functional mobility, power, and endurance compared to their prior level of function and independence due to above dx. However, the pt was able to demo sig improvements in ability to transfer and ambulation endurance as noted by 2 short bouts of ambulation in her room. The pt will continue to benefit from skilled PT to maximize functional strength and endurance here to give her confidence in her mobility prior to d/c home.     Follow Up Recommendations  CIR;Supervision/Assistance - 24 hour     Equipment Recommendations  (defer to CIR)    Recommendations for Other Services       Precautions / Restrictions Precautions Precautions: Cervical;Fall Precaution Booklet Issued: Yes (comment) Precaution Comments: reviewd cervial precautions with pt and husband with handout provided Required Braces or Orthoses: Cervical Brace Cervical Brace: Hard collar Restrictions Weight Bearing Restrictions: No    Mobility  Bed Mobility Overal bed mobility: Needs Assistance Bed Mobility: Supine to Sit     Supine to sit: Min assist     General bed mobility comments: minA to raise trunk from bed, no other assist needed.  Transfers Overall transfer  level: Needs assistance Equipment used: Rolling walker (2 wheeled) Transfers: Sit to/from [facility] Sit to Stand: Min assist;Min guard Stand pivot transfers: Min assist       General transfer comment: initially requires minA to power up, progressed to minG with reps. 5Xsts in 34 sec from recliner with VCs for hand placement  Ambulation/Gait Ambulation/Gait assistance: Min guard Gait Distance (Feet): 15 Feet(15 ft x 2) Assistive device: Rolling walker (2 wheeled) Gait Pattern/deviations: Step-to pattern;Decreased stride length;Shuffle;Trunk flexed   Gait velocity interpretation: <1.31 ft/sec, indicative of household ambulator General Gait Details: pt ambulates slowly but steadily in room, minimal foot clearance, and stride length, but no LOB. Pt very anxious but can do more than she thinks she can   Marine scientist [place] (Stroke Patients Only)       Balance Overall balance assessment: Needs assistance Sitting-balance support: Single extremity supported;Feet supported Sitting balance-Leahy Scale: Good Sitting balance - Comments: supervision   Standing balance support: Bilateral upper extremity supported;During functional activity Standing balance-Leahy Scale: Poor Standing balance comment: reliant on BUE support                            Cognition Arousal/Alertness: Awake/alert Behavior During Therapy: WFL for tasks assessed/performed;Anxious Overall Cognitive Status: Within Functional Limits for tasks assessed                                 General Comments: pt reports sig anxiety  about endurance and strength limitations, able to encourage and motivate pt to try to push mobility with PT  and she is agreeable, will do well at CIR      Exercises      General Comments General comments (skin integrity, edema, etc.): Reports numbness in L UE and LE but sensation was intact (L<R). pt  with sig anxiety, able to do more than she thinks she can with some encouragement      Pertinent Vitals/Pain Pain Assessment: No/denies pain Pain Intervention(s): Limited activity within patient's tolerance;Monitored during session    Home Living                      Prior Function            PT Goals (current goals can now be found in the care plan section) Acute Rehab PT Goals Patient Stated Goal: get well PT Goal Formulation: With patient Time For Goal Achievement: 09/30/19 Potential to Achieve Goals: Good Progress towards PT goals: Progressing toward goals    Frequency           PT Plan Current plan remains appropriate    Co-evaluation              AM-PAC PT "6 Clicks" Mobility   Outcome Measure  Help needed turning from your back to your side while in a flat bed without using bedrails?: A Little Help needed moving from lying on your back to sitting on the side of a flat bed without using bedrails?: A Little Help needed moving to and from a bed to a chair (including a wheelchair)?: A Little Help needed standing up from a chair using your arms (e.g., wheelchair or bedside chair)?: A Little Help needed to walk in hospital room?: A Lot Help needed climbing 3-5 steps with a railing? : A Lot 6 Click Score: 16    End of Session Equipment Utilized During Treatment: Gait belt;Cervical collar Activity Tolerance: Patient tolerated treatment well Patient left: in chair;with call bell/phone within reach;with chair alarm set;with family/visitor present   PT Visit Diagnosis: Unsteadiness on feet (R26.81);Other abnormalities of gait and mobility (R26.89);Muscle weakness (generalized) (M62.81)     Time: [id number] PT Time Calculation (min) (ACUTE ONLY): 35 min  Charges:  $Gait Training: 8-22 mins $Therapeutic Activity: 8-22 mins                     Karma Ganja, PT, DPT   Acute Rehabilitation Department Pager #: [phone]    Otho Bellows 09/17/2019, 4:39 PM

## 2019-09-18 ENCOUNTER — Encounter: Payer: Self-pay | Admitting: *Deleted

## 2019-09-18 LAB — GLUCOSE, CAPILLARY
Glucose-Capillary: 159 mg/dL — ABNORMAL HIGH (ref 70–99)
Glucose-Capillary: 163 mg/dL — ABNORMAL HIGH (ref 70–99)
Glucose-Capillary: 221 mg/dL — ABNORMAL HIGH (ref 70–99)
Glucose-Capillary: 428 mg/dL — ABNORMAL HIGH (ref 70–99)

## 2019-09-18 MED ORDER — INSULIN ASPART 100 UNIT/ML ~~LOC~~ SOLN
8.0000 [IU] | Freq: Once | SUBCUTANEOUS | Status: AC
  Administered 2019-09-18: 8 [IU] via SUBCUTANEOUS

## 2019-09-18 NOTE — Progress Notes (Signed)
PROGRESS NOTE    DUANE TRIAS  CNO:[id number] DOB: 12-12-1940 DOA: 09/14/2019 PCP: Caren Macadam, MD    Brief Narrative:  Doris Jackson is a 79 year old female with a history of diabetes, hypertension, hyperlipidemia, presents to the emergency room with worsening numbness and tingling in her hands and feet bilaterally.  MRI of the brain and cervical spine was recently performed approximately 4 days prior to admission when she had visited the emergency room.  At that time, showed cord compression due to severe spinal stenosis.  She was advised for admission and surgery, but she refused at that time since she wanted to discuss this further with her family.  She returned to the hospital with worsening symptoms and was admitted to the hospital for operative management.  She was evaluated by neurosurgery and underwent surgical management on 3/18.   Assessment & Plan:   Principal Problem:   Spinal stenosis in cervical region Active Problems:   DM (diabetes mellitus) type II controlled with renal manifestation (HCC)   Dyslipidemia   Microcytic anemia   Essential hypertension   Obesity, morbid, BMI 40.0-49.9 (HCC)   AKI (acute kidney injury) (HCC)   GERD (gastroesophageal reflux disease)   Numbness and tingling   Cervical spinal stenosis   Cervical spondylosis with myelopathy and radiculopathy   Cervical spinal stenosis with cord compression Patient presented with weakness and paresthesias to bilateral upper extremities, left greater than right.  Was noted to have significant cervical spinal stenosis with cord compression.  Evaluated by neurosurgery and underwent anterior cervical decompression with fusion by neurosurgery, Dr. Christella Noa on 09/15/2019.  Completed 2-day course of Decadron per neurosurgery.  PT and OT recommend CIR placement to obtain independence back to her previous baseline; currently requiring max assist with much of her ADLs currently in which she was very independent at  baseline prior to surgical intervention. --Further postoperative care per neurosurgery --Continue therapy while inpatient --Pending rehab MD/CIR consultation  Acute renal failure: resolved Creatinine 1.78 on admission (baseline 1.2), likely secondary to prerenal azotemia from poor oral intake in the days preceding hospitalization.  Received IV fluid hydration with improvement of creatinine to 1.18 today. --Continue monitor renal function closely --Avoid nephrotoxins, renally dose all medications  Essential hypertension BP 143/74 this morning. --continue home amlodipine-benazepril 5-80m PO daily, HCTZ 254mPO daily, metoprolol succinate 5048mO daily  Type 2 diabetes mellitus On Januvia 100 mg p.o. daily, Actos 45 mg p.o. daily, Metformin 500 mg p.o. twice daily, glipizide 5 mg p.o. daily at home.  Hemoglobin A1c 7.0 on 08/16/2019, well controlled. --Glipizide and Actos --Insulin sliding scale for further coverage  GERD: Continue PPI  Microcytic anemia: Continue iron supplementation 150 mg p.o. daily  Hyperlipidemia: Continue statin   DVT prophylaxis: SCDs Code Status: Full code Family Communication: Updated patient at bedside Disposition Plan:      Patient is from: Home with spouse     Anticipated Disposition:  CIR     Barriers to discharge or conditions that needs to be met prior to discharge: pending CIR evaluation,  neurosurgery sign off  Consultants:   Neurosurgery - Dr. CabChristella Noarocedures:  Anterior Cervical decompression C3/4 C5 cervical corpectomy Arthrodesis C3-4with 9mm90mructural allograft Arthrodesis C4-6 24mm74mk cage with autograft morsels Anterior instrumentation(Nuvasive ACP) C3-6  Antimicrobials:   none   Subjective: Seen and examined bedside, resting comfortably.  Spouse present.  Continues with mild weakness and paresthesias.  No other specific complaints at this time.  Continues to require significant  assistance with activities of daily living,  which is different than her normal baseline of independence.  Denies headache, no fever/chills/night sweats, no nausea/vomiting/diarrhea, no chest pain, no palpitations, no shortness of breath, no abdominal pain.  No acute events overnight per nursing staff.  Objective: Vitals:   09/17/19 2002 09/18/19 0336 09/18/19 0500 09/18/19 0730  BP: (!) 101/54 139/71  (!) 124/55  Pulse: 67 73  61  Resp: _0 Temp: 97.7 F (36.5 C) 97.8 F (36.6 C)  97.6 F (36.4 C)  TempSrc: Oral   Oral  SpO2: 97%   98%  Weight:   98.9 kg   Height:        Intake/Output Summary (Last 24 hours) at 09/18/2019 1150 Last data filed at 09/18/2019 1031 Gross per 24 hour  Intake 360 ml  Output --  Net 360 ml   Filed Weights   09/15/19 1434 09/18/19 0500  Weight: 99.8 kg 98.9 kg    Examination:  General exam: Appears calm and comfortable  Respiratory system: Clear to auscultation. Respiratory effort normal. Cardiovascular system: S1 & S2 heard, RRR. No JVD, murmurs, rubs, gallops or clicks. No pedal edema. Gastrointestinal system: Abdomen is nondistended, soft and nontender. No organomegaly or masses felt. Normal bowel sounds heard. Central nervous system: Alert and oriented. No focal neurological deficits. Extremities: Symmetric 5 x 5 power. Skin: No rashes, lesions or ulcers, noted anterior neck surgical incision wound, clean/dry/intact without erythema/fluctuance Psychiatry: Judgement and insight appear normal. Mood & affect appropriate.     Data Reviewed: I have personally reviewed following labs and imaging studies  CBC: Recent Labs  Lab 09/14/19 1307 09/15/19 0328 09/16/19 0248  WBC 10.0 9.7 11.4*  HGB 11.6* 10.6* 8.8*  HCT 34.8* 31.5* 26.0*  MCV 78.9* 79.5* 78.8*  PLT 167 155 272*   Basic Metabolic Panel: Recent Labs  Lab 09/14/19 1307 09/14/19 1422 09/15/19 0328 09/16/19 0248 09/16/19 0422 09/17/19 0346  NA 138  --  143 143 141 139  K 4.1  --  3.8 7.0* 4.9 4.2  CL 100  --   106 114* 105 102  CO2 23  --  25 20* 24 26  GLUCOSE 247*  --  144* 191* 221* 169*  BUN 65*  --  50* 28* 34* 29*  CREATININE 1.78*  --  1.51* 1.03* 1.17* 1.18*  CALCIUM 9.1  --  8.9 7.3* 8.5* 8.5*  MG  --  1.8  --   --   --   --    GFR: Estimated Creatinine Clearance: 43.9 mL/min (A) (by C-G formula based on SCr of 1.18 mg/dL (H)). Liver Function Tests: Recent Labs  Lab 09/14/19 1307  AST 14*  ALT 14  ALKPHOS 62  BILITOT 0.5  PROT 7.4  ALBUMIN 3.5   No results for input(s): LIPASE, AMYLASE in the last 168 hours. No results for input(s): AMMONIA in the last 168 hours. Coagulation Profile: Recent Labs  Lab 09/15/19 1118  INR 1.0   Cardiac Enzymes: No results for input(s): CKTOTAL, CKMB, CKMBINDEX, TROPONINI in the last 168 hours. BNP (last 3 results) No results for input(s): PROBNP in the last 8760 hours. HbA1C: No results for input(s): HGBA1C in the last 72 hours. CBG: Recent Labs  Lab 09/16/19 2135 09/17/19 0633 09/17/19 1154 09/17/19 2004 09/18/19 0636  GLUCAP 207* 160* 219* 371* 159*   Lipid Profile: No results for input(s): CHOL, HDL, LDLCALC, TRIG, CHOLHDL, LDLDIRECT in the last 72 hours. Thyroid Function Tests: No results  for input(s): TSH, T4TOTAL, FREET4, T3FREE, THYROIDAB in the last 72 hours. Anemia Panel: No results for input(s): VITAMINB12, FOLATE, FERRITIN, TIBC, IRON, RETICCTPCT in the last 72 hours. Sepsis Labs: No results for input(s): PROCALCITON, LATICACIDVEN in the last 168 hours.  Recent Results (from the past 240 hour(s))  SARS CORONAVIRUS 2 (TAT 6-24 HRS) Nasopharyngeal Nasopharyngeal Swab     Status: None   Collection Time: 09/14/19  2:13 PM   Specimen: Nasopharyngeal Swab  Result Value Ref Range Status   SARS Coronavirus 2 NEGATIVE NEGATIVE Final    Comment: (NOTE) SARS-CoV-2 target nucleic acids are NOT DETECTED. The SARS-CoV-2 RNA is generally detectable in upper and lower respiratory specimens during the acute phase of infection.  Negative results do not preclude SARS-CoV-2 infection, do not rule out co-infections with other pathogens, and should not be used as the sole basis for treatment or other patient management decisions. Negative results must be combined with clinical observations, patient history, and epidemiological information. The expected result is Negative. Fact Sheet for Patients: SugarRoll.be Fact Sheet for Healthcare Providers: https://www.woods-mathews.com/ This test is not yet approved or cleared by the [place] FDA and  has been authorized for detection and/or diagnosis of SARS-CoV-2 by FDA under an Emergency Use Authorization (EUA). This EUA will remain  in effect (meaning this test can be used) for the duration of the COVID-19 declaration under Section 56 4(b)(1) of the Act, 21 U.S.C. section 360bbb-3(b)(1), unless the authorization is terminated or revoked sooner. Performed at [facility], [place] [address] [place], [place] [address]   Surgical pcr screen     Status: None   Collection Time: 09/15/19 10:34 AM   Specimen: Nasal Mucosa; Nasal Swab  Result Value Ref Range Status   MRSA, PCR NEGATIVE NEGATIVE Final   Staphylococcus aureus NEGATIVE NEGATIVE Final    Comment: (NOTE) The Xpert SA Assay (FDA approved for NASAL specimens in patients 60 years of age and older), is one component of a comprehensive surveillance program. It is not intended to diagnose infection nor to guide or monitor treatment. Performed at [facility], [place] [address], [place], [place] [address]          Radiology Studies: No results found.      Scheduled Meds: . amLODipine  5 mg Oral Daily   And  . benazepril  40 mg Oral Daily  . glipiZIDE  5 mg Oral Daily  . hydrochlorothiazide  25 mg Oral Daily  . HYDROcodone-acetaminophen  1 tablet Oral Q6H  . insulin aspart  0-15 Units Subcutaneous TID WC  . insulin aspart  0-5 Units Subcutaneous QHS    . iron polysaccharides  150 mg Oral Daily  . loratadine  10 mg Oral Daily  . metoprolol succinate  50 mg Oral Daily  . pantoprazole  40 mg Oral QHS  . pioglitazone  45 mg Oral Daily  . pravastatin  40 mg Oral Daily  . prednisoLONE acetate  1 drop Left Eye Q6H   And  . prednisoLONE acetate  1 drop Right Eye BID  . senna  1 tablet Oral BID  . sodium chloride flush  3 mL Intravenous Q12H   Continuous Infusions: . sodium chloride       LOS: 4 days    Time spent: 34 minutes spent on chart review, discussion with nursing staff, consultants, updating family and interview/physical exam; more than 50% of that time was spent in counseling and/or coordination of care.    Donnamarie Poag  [place] DO [facility] Available via Epic secure chat 7am-7pm After these hours, please refer to coverage provider listed on [email or web address] 09/18/2019, 11:50 AM

## 2019-09-18 NOTE — Plan of Care (Signed)
  Problem: Education: Goal: Knowledge of General Education information will improve Description: Including pain rating scale, medication(s)/side effects and non-pharmacologic comfort measures Outcome: Progressing   Problem: Clinical Measurements: Goal: Respiratory complications will improve Outcome: Progressing Note: On room air   Problem: Nutrition: Goal: Adequate nutrition will be maintained Outcome: Progressing   Problem: Coping: Goal: Level of anxiety will decrease Outcome: Progressing   Problem: Pain Managment: Goal: General experience of comfort will improve Outcome: Progressing   Problem: Safety: Goal: Ability to remain free from injury will improve Outcome: Progressing

## 2019-09-18 NOTE — Progress Notes (Signed)
Patient ID: Doris Jackson, female   DOB: 1940-11-16, 79 y.o.   MRN: [id number] Patient doing well swallowing pured foods well no significant change in weakness in arms and legs overall stable improving some with physical therapy  Incision clean dry and intact  Continue PT OT rehab placement

## 2019-09-18 NOTE — Progress Notes (Signed)
   09/18/19 1933  Provider Notification  Provider Name/Title Blount NP  Date Provider Notified 09/18/19  Time Provider Notified 1933  Notification Type Page  Notification Reason Change in status (CBG is 428)  Response See new orders  Date of Provider Response 09/18/19   Patient's CBG is 428.  [facility] paged and notified.  New orders received and implemented.  Will continue to monitor patient.  Earleen Reaper RN

## 2019-09-19 ENCOUNTER — Ambulatory Visit: Payer: Medicare HMO | Admitting: Physical Therapy

## 2019-09-19 ENCOUNTER — Other Ambulatory Visit: Payer: Self-pay | Admitting: Internal Medicine

## 2019-09-19 LAB — GLUCOSE, CAPILLARY
Glucose-Capillary: 248 mg/dL — ABNORMAL HIGH (ref 70–99)
Glucose-Capillary: 150 mg/dL — ABNORMAL HIGH (ref 70–99)
Glucose-Capillary: 566 mg/dL (ref 70–99)
Glucose-Capillary: 113 mg/dL — ABNORMAL HIGH (ref 70–99)
Glucose-Capillary: 317 mg/dL — ABNORMAL HIGH (ref 70–99)

## 2019-09-19 LAB — GLUCOSE, RANDOM: Glucose, Bld: 209 mg/dL — ABNORMAL HIGH (ref 70–99)

## 2019-09-19 MED ORDER — VITAMIN D 25 MCG (1000 UNIT) PO TABS
2000.0000 [IU] | ORAL_TABLET | Freq: Every day | ORAL | Status: DC
  Administered 2019-09-19 – 2019-09-22 (×4): 2000 [IU] via ORAL
  Filled 2019-09-19 (×4): qty 2

## 2019-09-19 MED ORDER — INSULIN GLARGINE 100 UNIT/ML ~~LOC~~ SOLN
10.0000 [IU] | Freq: Every day | SUBCUTANEOUS | Status: DC
  Administered 2019-09-19 – 2019-09-22 (×4): 10 [IU] via SUBCUTANEOUS
  Filled 2019-09-19 (×4): qty 0.1

## 2019-09-19 MED ORDER — GLUCERNA SHAKE PO LIQD
237.0000 mL | Freq: Two times a day (BID) | ORAL | Status: DC
  Administered 2019-09-19 – 2019-09-22 (×6): 237 mL via ORAL

## 2019-09-19 MED ORDER — LORAZEPAM 0.5 MG PO TABS: 0.5000 mg | ORAL_TABLET | Freq: Four times a day (QID) | ORAL | Status: DC | PRN

## 2019-09-19 NOTE — Progress Notes (Signed)
PROGRESS NOTE    SEMIRA STOLTZFUS  UJW:[id number] DOB: [birth year] DOA: 09/14/2019 PCP: Caren Macadam, MD    Brief Narrative:  Doris Jackson is a 79 year old female with a history of diabetes, hypertension, hyperlipidemia, presents to the emergency room with worsening numbness and tingling in her hands and feet bilaterally.  MRI of the brain and cervical spine was recently performed approximately 4 days prior to admission when she had visited the emergency room.  At that time, showed cord compression due to severe spinal stenosis.  She was advised for admission and surgery, but she refused at that time since she wanted to discuss this further with her family.  She returned to the hospital with worsening symptoms and was admitted to the hospital for operative management.  She was evaluated by neurosurgery and underwent surgical management on 3/18.   Assessment & Plan:   Principal Problem:   Spinal stenosis in cervical region Active Problems:   DM (diabetes mellitus) type II controlled with renal manifestation (HCC)   Dyslipidemia   Microcytic anemia   Essential hypertension   Obesity, morbid, BMI 40.0-49.9 (HCC)   AKI (acute kidney injury) (HCC)   GERD (gastroesophageal reflux disease)   Numbness and tingling   Cervical spinal stenosis   Cervical spondylosis with myelopathy and radiculopathy   Cervical spinal stenosis with cord compression Patient presented with weakness and paresthesias to bilateral upper extremities, left greater than right.  Was noted to have significant cervical spinal stenosis with cord compression.  Evaluated by neurosurgery and underwent anterior cervical decompression with fusion by neurosurgery, Dr. Christella Noa on 09/15/2019.  Completed 2-day course of Decadron per neurosurgery.  PT and OT recommend CIR placement to obtain independence back to her previous baseline; currently requiring max assist with much of her ADLs currently in which she was very independent at  baseline prior to surgical intervention. --Further postoperative care per neurosurgery --Continue therapy while inpatient --Pending rehab MD/CIR consultation  Acute renal failure: resolved Creatinine 1.78 on admission (baseline 1.2), likely secondary to prerenal azotemia from poor oral intake in the days preceding hospitalization.  Received IV fluid hydration with improvement of creatinine to 1.18. --Avoid nephrotoxins, renally dose all medications  Essential hypertension BP 143/74 this morning. --continue home amlodipine-benazepril 5-34m PO daily, HCTZ 245mPO daily, metoprolol succinate 5035mO daily  Type 2 diabetes mellitus On Januvia 100 mg p.o. daily, Actos 45 mg p.o. daily, Metformin 500 mg p.o. twice daily, glipizide 5 mg p.o. daily at home.  Hemoglobin A1c 7.0 on 08/16/2019, well controlled. --Glipizide and Actos --Insulin sliding scale for further coverage  GERD: Continue PPI  Microcytic anemia: Continue iron supplementation 150 mg p.o. daily  Hyperlipidemia: Continue statin   DVT prophylaxis: SCDs Code Status: Full code Family Communication: Updated patient at bedside Disposition Plan:      Patient is from: Home with spouse     Anticipated Disposition:  CIR     Barriers to discharge or conditions that needs to be met prior to discharge: pending CIR evaluation,  neurosurgery sign off.  Unsafe discharge home at this time as she remains 2+ assist with ADLs which is far from her normal baseline of independence.  Consultants:   Neurosurgery - Dr. CabChristella Noarocedures:  Anterior Cervical decompression C3/4 C5 cervical corpectomy Arthrodesis C3-4with 9mm63mructural allograft Arthrodesis C4-6 24mm73mk cage with autograft morsels Anterior instrumentation(Nuvasive ACP) C3-6  Antimicrobials:   none   Subjective: Patient seen and examined bedside, resting comfortably.  Eating breakfast.  Tolerating  dysphagia 1 diet.  Continues with mild weakness and paresthesias.   Patient with significant anxiety overnight, states "concerned about unable to write checks".  No other specific complaints at this time.  Continues to require significant assistance with activities of daily living, which is different than her normal baseline of independence.  Denies headache, no fever/chills/night sweats, no nausea/vomiting/diarrhea, no chest pain, no palpitations, no shortness of breath, no abdominal pain.  No other acute events overnight per nursing staff.  Objective: Vitals:   09/18/19 1309 09/18/19 1826 09/18/19 1924 09/19/19 0320  BP: 134/72 (!) 101/49 137/82 115/60  Pulse: 85 96 90 87  Resp: '18 18 17 16  ' Temp: (!) 97.5 F (36.4 C) 98.2 F (36.8 C) (!) 97.4 F (36.3 C) 98 F (36.7 C)  TempSrc: Oral Oral Oral Oral  SpO2: 96% 100% 100% 97%  Weight:      Height:        Intake/Output Summary (Last 24 hours) at 09/19/2019 0744 Last data filed at 09/19/2019 0600 Gross per 24 hour  Intake 600 ml  Output 0 ml  Net 600 ml   Filed Weights   09/15/19 1434 09/18/19 0500  Weight: 99.8 kg 98.9 kg    Examination:  General exam: Appears calm and comfortable  Respiratory system: Clear to auscultation. Respiratory effort normal. Cardiovascular system: S1 & S2 heard, RRR. No JVD, murmurs, rubs, gallops or clicks. No pedal edema. Gastrointestinal system: Abdomen is nondistended, soft and nontender. No organomegaly or masses felt. Normal bowel sounds heard. Central nervous system: Alert and oriented. No focal neurological deficits. Extremities: Symmetric 5 x 5 power. Skin: No rashes, lesions or ulcers, noted anterior neck surgical incision wound, clean/dry/intact without erythema/fluctuance Psychiatry: Judgement and insight appear normal. Mood & affect appropriate.     Data Reviewed: I have personally reviewed following labs and imaging studies  CBC: Recent Labs  Lab 09/14/19 1307 09/15/19 0328 09/16/19 0248  WBC 10.0 9.7 11.4*  HGB 11.6* 10.6* 8.8*  HCT 34.8*  31.5* 26.0*  MCV 78.9* 79.5* 78.8*  PLT 167 155 761*   Basic Metabolic Panel: Recent Labs  Lab 09/14/19 1307 09/14/19 1422 09/15/19 0328 09/16/19 0248 09/16/19 0422 09/17/19 0346  NA 138  --  143 143 141 139  K 4.1  --  3.8 7.0* 4.9 4.2  CL 100  --  106 114* 105 102  CO2 23  --  25 20* 24 26  GLUCOSE 247*  --  144* 191* 221* 169*  BUN 65*  --  50* 28* 34* 29*  CREATININE 1.78*  --  1.51* 1.03* 1.17* 1.18*  CALCIUM 9.1  --  8.9 7.3* 8.5* 8.5*  MG  --  1.8  --   --   --   --    GFR: Estimated Creatinine Clearance: 43.9 mL/min (A) (by C-G formula based on SCr of 1.18 mg/dL (H)). Liver Function Tests: Recent Labs  Lab 09/14/19 1307  AST 14*  ALT 14  ALKPHOS 62  BILITOT 0.5  PROT 7.4  ALBUMIN 3.5   No results for input(s): LIPASE, AMYLASE in the last 168 hours. No results for input(s): AMMONIA in the last 168 hours. Coagulation Profile: Recent Labs  Lab 09/15/19 1118  INR 1.0   Cardiac Enzymes: No results for input(s): CKTOTAL, CKMB, CKMBINDEX, TROPONINI in the last 168 hours. BNP (last 3 results) No results for input(s): PROBNP in the last 8760 hours. HbA1C: No results for input(s): HGBA1C in the last 72 hours. CBG: Recent Labs  Lab  09/18/19 0636 09/18/19 1209 09/18/19 1645 09/18/19 1926 09/19/19 0642  GLUCAP 159* 163* 221* 428* 150*   Lipid Profile: No results for input(s): CHOL, HDL, LDLCALC, TRIG, CHOLHDL, LDLDIRECT in the last 72 hours. Thyroid Function Tests: No results for input(s): TSH, T4TOTAL, FREET4, T3FREE, THYROIDAB in the last 72 hours. Anemia Panel: No results for input(s): VITAMINB12, FOLATE, FERRITIN, TIBC, IRON, RETICCTPCT in the last 72 hours. Sepsis Labs: No results for input(s): PROCALCITON, LATICACIDVEN in the last 168 hours.  Recent Results (from the past 240 hour(s))  SARS CORONAVIRUS 2 (TAT 6-24 HRS) Nasopharyngeal Nasopharyngeal Swab     Status: None   Collection Time: 09/14/19  2:13 PM   Specimen: Nasopharyngeal Swab    Result Value Ref Range Status   SARS Coronavirus 2 NEGATIVE NEGATIVE Final    Comment: (NOTE) SARS-CoV-2 target nucleic acids are NOT DETECTED. The SARS-CoV-2 RNA is generally detectable in upper and lower respiratory specimens during the acute phase of infection. Negative results do not preclude SARS-CoV-2 infection, do not rule out co-infections with other pathogens, and should not be used as the sole basis for treatment or other patient management decisions. Negative results must be combined with clinical observations, patient history, and epidemiological information. The expected result is Negative. Fact Sheet for Patients: SugarRoll.be Fact Sheet for Healthcare Providers: https://www.woods-mathews.com/ This test is not yet approved or cleared by the [place] FDA and  has been authorized for detection and/or diagnosis of SARS-CoV-2 by FDA under an Emergency Use Authorization (EUA). This EUA will remain  in effect (meaning this test can be used) for the duration of the COVID-19 declaration under Section 56 4(b)(1) of the Act, 21 U.S.C. section 360bbb-3(b)(1), unless the authorization is terminated or revoked sooner. Performed at [facility], [place] [address] [place], [place] [address]   Surgical pcr screen     Status: None   Collection Time: 09/15/19 10:34 AM   Specimen: Nasal Mucosa; Nasal Swab  Result Value Ref Range Status   MRSA, PCR NEGATIVE NEGATIVE Final   Staphylococcus aureus NEGATIVE NEGATIVE Final    Comment: (NOTE) The Xpert SA Assay (FDA approved for NASAL specimens in patients 12 years of age and older), is one component of a comprehensive surveillance program. It is not intended to diagnose infection nor to guide or monitor treatment. Performed at [facility], [place] [address], [place], [place] [address]          Radiology Studies: No results found.      Scheduled Meds: . amLODipine  5 mg  Oral Daily   And  . benazepril  40 mg Oral Daily  . glipiZIDE  5 mg Oral Daily  . hydrochlorothiazide  25 mg Oral Daily  . insulin aspart  0-15 Units Subcutaneous TID WC  . insulin aspart  0-5 Units Subcutaneous QHS  . iron polysaccharides  150 mg Oral Daily  . loratadine  10 mg Oral Daily  . metoprolol succinate  50 mg Oral Daily  . pantoprazole  40 mg Oral QHS  . pioglitazone  45 mg Oral Daily  . pravastatin  40 mg Oral Daily  . prednisoLONE acetate  1 drop Left Eye Q6H   And  . prednisoLONE acetate  1 drop Right Eye BID  . senna  1 tablet Oral BID  . sodium chloride flush  3 mL Intravenous Q12H   Continuous Infusions: . sodium chloride       LOS: 5 days    Time spent: 34 minutes  spent on chart review, discussion with nursing staff, consultants, updating family and interview/physical exam; more than 50% of that time was spent in counseling and/or coordination of care.    Marelly Wehrman J [place] DO [facility] Available via Epic secure chat 7am-7pm After these hours, please refer to coverage provider listed on [email or web address] 09/19/2019, 7:44 AM

## 2019-09-19 NOTE — Progress Notes (Signed)
Patient ID: Doris Jackson, female   DOB: [birth year], 79 y.o.   MRN: [id number] BP 140/75 (BP Location: Left Arm)   Pulse 98   Temp 98.6 F (37 C) (Oral)   Resp 17   Ht 5' 2.01" (1.575 m)   Wt 98.9 kg   SpO2 100%   BMI 39.87 kg/m  Alert and oriented x 4 Speech is clear, fluent Voice is strong Wound is clean, dry, no signs of infection Moving all extremities Weakness in hands Blood sugar has been difficult to control Await insurance review for CIR

## 2019-09-19 NOTE — Progress Notes (Signed)
CRITICAL VALUE ALERT  Critical Value:  CBG 566  Date & Time Notied:  09/19/19 at 1124  Provider Notified: [place] MD  Orders Received/Actions taken: 15U insulin given, stat lab, lantus insulin

## 2019-09-19 NOTE — Progress Notes (Signed)
Initial Nutrition Assessment  DOCUMENTATION CODES:   Obesity unspecified  INTERVENTION:  Provide Glucerna Shake po BID, each supplement provides 220 kcal and 10 grams of protein.  Encourage adequate PO intake.   NUTRITION DIAGNOSIS:   Increased nutrient needs related to post-op healing as evidenced by estimated needs.  GOAL:   Patient will meet greater than or equal to 90% of their needs  MONITOR:   PO intake, Supplement acceptance, Skin, Weight trends, Labs, I & O's, Diet advancement  REASON FOR ASSESSMENT:   Consult Poor PO  ASSESSMENT:   79 year old female with a history of diabetes, hypertension, hyperlipidemia, presents to the emergency room with worsening numbness and tingling in her hands and feet bilaterally.  MRI of the brain and cervical spine was recently performed showed cord compression due to severe spinal stenosis underwent surgical management on 3/18.  Procedure (3/18):  Cervical five Corpectomy ANTERIOR CERVICAL DECOMPRESSION/DISCECTOMY FUSION CERVICAL THREE-CERVICAL Four    Pt is currently on a dysphagia 1 diet with thin liquids. Per SLP, pt with acute short term dysphagia likely due to edema, which has improved. Pt requests continuation of pureed diet and declined diet advancement. PO intake has improved. Meal completion has been 50-100% today. Pt reports having a good appetite currently and PTA with usual consumption of at least 3 meals a day with no difficulties. Pt with no significant weight loss per weight records. RD to order nutritional supplements to aid in caloric and protein needs as well as in post op healing.   Labs and medications reviewed.   NUTRITION - FOCUSED PHYSICAL EXAM:    Most Recent Value  Orbital Region  No depletion  Upper Arm Region  No depletion  Thoracic and Lumbar Region  No depletion  Buccal Region  No depletion  Temple Region  No depletion  Clavicle Bone Region  No depletion  Clavicle and Acromion Bone Region  No  depletion  Scapular Bone Region  No depletion  Dorsal Hand  No depletion  Patellar Region  No depletion  Anterior Thigh Region  No depletion  Posterior Calf Region  No depletion  Edema (RD Assessment)  None  Hair  Reviewed  Eyes  Reviewed  Mouth  Reviewed  Skin  Reviewed  Nails  Reviewed       Diet Order:   Diet Order            DIET - DYS 1 Room service appropriate? Yes; Fluid consistency: Thin  Diet effective now              EDUCATION NEEDS:   Not appropriate for education at this time  Skin:  Skin Assessment: Skin Integrity Issues: Skin Integrity Issues:: Incisions Incisions: neck  Last BM:  3/17  Height:   Ht Readings from Last 1 Encounters:  09/15/19 5' 2.01" (1.575 m)    Weight:   Wt Readings from Last 1 Encounters:  09/18/19 98.9 kg    Ideal Body Weight:  50 kg  BMI:  Body mass index is 39.87 kg/m.  Estimated Nutritional Needs:   Kcal:  1750-2000  Protein:  95-105 grams  Fluid:  >/= 1.8 L/day    Corrin Parker, MS, RD, LDN RD pager number/after hours weekend pager number on Amion.

## 2019-09-19 NOTE — PMR Pre-admission (Signed)
PMR Admission Coordinator Pre-Admission Assessment  Patient: Doris Jackson is an 79 y.o., female MRN: [id number] DOB: July 22, 1940 Height: 5' 2.01" (157.5 cm) Weight: 99.3 kg  Insurance Information HMO:     PPO: yes     PCP:      IPA:      80/20:     OTHER:  PRIMARY: [employer] Medicare     Policy#: [id number]     Subscriber: patient CM Name:   General CM   Phone#:  [phone]    Fax#: [phone] Pre-Cert#: [id number] approved for 7 days with f/u with [facility] phone [phone] fax [phone]   Employer: Benefits: [phone]   Eff. Date: 06/30/2016-06/29/2020     Deduct: NA      Out of Pocket Max: $$5,000 ($403.40 met)     Life Max:  CIR: $295/day co-pay per days 1-6    SNF: $0 co-pay days 1-20, $184/day co-pay for days 21-100. Limited to 100 days Outpatient: $35/visit co-pay     Co-Pay: visits limited by medical necessity Home Health: 100% coverage, 0% co-insurance. Limited by medical necessity      Co-Pay:  DME: 80% coverage; 20% co-insurance     Co-Pay:  Providers: in network  SECONDARY:  NA              Medicaid Application Date:  NA     Case Manager:  Disability Application Date: NA      Case Worker:   The "Data Collection Information Summary" for patients in Inpatient Rehabilitation Facilities with attached "Privacy Act [place] Records" was provided and verbally reviewed with: Patient and Family  Emergency Contact Information Contact Information    Name Relation Home Work Mobile   Boer,Dennis Spouse [phone]  [phone]      Current Medical History  Patient Admitting Diagnosis: cervical myelopathy  History of Present Illness: Pt is a 79 year old female with past medical hx significant for HTN, hyperlipidemia, DM, left eye corneal transplant, microcytic anemia, seasonal allergies.  Pt presented to ED on 09/14/19 d/t hand a foot pain. Pt was having more trouble walking and having trouble using her hands.  Pt was seen in ER 4 days prior and diagnosed with  cervical myelopathy. Pt refused admission at that time to discuss issue with husband.  Pt admitted on 3/17 with plan for surgery in next 2 days.  Pt had anterior cervical decompression C3/4 on 09/15/19  Completed course of Decadron. Tolerating pureed diet. Creatinine 1.78 on admit with baseline 1.2, likely secondary to prerenal azotemia form poor oral intake pta. Received IV fluid hydration with improvement of creat to 1.18. Plans to avoid nephrotoxins and renal dose all medications.  On Januvia 100 mg p.o. daily, Actos 45 mg p.o. daily, Metformin 500 mg p.o. twice daily, glipizide 5 mg p.o. daily at home. Hemoglobin A1c 7.0 on 08/16/2019, well controlled. --Glipizide and Actos --Lantus 10 unit subcutaneously daily --Insulin sliding scale for further coverage --glucose trends reviewed. For the most part, controlled with transient value in the 200's noted. Will cont to follow  Patient's medical record from [facility] has been reviewed by the rehabilitation admission coordinator and physician.  Past Medical History  Past Medical History:  Diagnosis Date  . ALLERGIC RHINITIS 06/17/2010  . ANEMIA-NOS 06/17/2010  . BREAST CANCER, HX OF 06/17/2010  . Cancer [facility]) 2008   right breast  . Cataract   . COPD 06/17/2010  . DIABETES MELLITUS, TYPE II 06/17/2010  . Endometriosis   . Fibroid    FIBROID  .  HYPERLIPIDEMIA 06/17/2010  . HYPERTENSION 06/17/2010  . Leg swelling   . LOW BACK PAIN 06/17/2010  . OSTEOARTHRITIS 06/17/2010  . OSTEOPENIA 06/17/2010  . Weakness     Family History   family history includes Asthma in her father; Diabetes in her brother and mother; Hypertension in her sister.  Prior Rehab/Hospitalizations Has the patient had prior rehab or hospitalizations prior to admission? Yes  Has the patient had major surgery during 100 days prior to admission? Yes   Current Medications  Current Facility-Administered Medications:  .  0.9 %  sodium chloride infusion, 250 mL,  Intravenous, Continuous, Cabbell, Kyle, MD .  acetaminophen (TYLENOL) tablet 650 mg, 650 mg, Oral, Q6H PRN **OR** acetaminophen (TYLENOL) suppository 650 mg, 650 mg, Rectal, Q6H PRN, Ashok Pall, MD .  amLODipine (NORVASC) tablet 5 mg, 5 mg, Oral, Daily, 5 mg at 09/22/19 0946 **AND** benazepril (LOTENSIN) tablet 40 mg, 40 mg, Oral, Daily, Ashok Pall, MD, 40 mg at 09/22/19 0944 .  azelastine (ASTELIN) 0.1 % nasal spray 1 spray, 1 spray, Each Nare, Daily PRN **AND** fluticasone (FLONASE) 50 MCG/ACT nasal spray 1 spray, 1 spray, Each Nare, Daily PRN, Ashok Pall, MD .  bisacodyl (DULCOLAX) EC tablet 5 mg, 5 mg, Oral, Daily PRN, Ashok Pall, MD .  cholecalciferol (VITAMIN D3) tablet 2,000 Units, 2,000 Units, Oral, Daily, [place] Eric J, DO, 2,000 Units at 09/22/19 0945 .  cyclobenzaprine (FLEXERIL) tablet 10 mg, 10 mg, Oral, TID PRN, Ashok Pall, MD, 10 mg at 09/18/19 2104 .  feeding supplement (GLUCERNA SHAKE) (GLUCERNA SHAKE) liquid 237 mL, 237 mL, Oral, BID BM, [place] Eric J, DO, 237 mL at 09/22/19 1029 .  glipiZIDE (GLUCOTROL XL) 24 hr tablet 5 mg, 5 mg, Oral, Daily, Ashok Pall, MD, 5 mg at 09/22/19 1028 .  hydrochlorothiazide (HYDRODIURIL) tablet 25 mg, 25 mg, Oral, Daily, Ashok Pall, MD, 25 mg at 09/22/19 0944 .  HYDROcodone-acetaminophen (NORCO/VICODIN) 5-325 MG per tablet 1 tablet, 1 tablet, Oral, Q4H PRN, Ashok Pall, MD, 1 tablet at 09/22/19 1025 .  insulin aspart (novoLOG) injection 0-15 Units, 0-15 Units, Subcutaneous, TID WC, Ashok Pall, MD, 2 Units at 09/22/19 0920 .  insulin aspart (novoLOG) injection 0-5 Units, 0-5 Units, Subcutaneous, QHS, Ashok Pall, MD, 3 Units at 09/21/19 2216 .  insulin glargine (LANTUS) injection 10 Units, 10 Units, Subcutaneous, Daily, [place] Eric J, DO, 10 Units at 09/22/19 1028 .  iron polysaccharides (NIFEREX) capsule 150 mg, 150 mg, Oral, Daily, Ashok Pall, MD, 150 mg at 09/22/19 0945 .  loratadine (CLARITIN) tablet 10 mg, 10 mg,  Oral, Daily, Ashok Pall, MD, 10 mg at 09/22/19 0944 .  LORazepam (ATIVAN) tablet 0.5 mg, 0.5 mg, Oral, Q6H PRN, [place] Eric J, DO .  magnesium citrate solution 1 Bottle, 1 Bottle, Oral, Once PRN, Ashok Pall, MD .  menthol-cetylpyridinium (CEPACOL) lozenge 3 mg, 1 lozenge, Oral, PRN **OR** phenol (CHLORASEPTIC) mouth spray 1 spray, 1 spray, Mouth/Throat, PRN, Ashok Pall, MD .  metoprolol succinate (TOPROL-XL) 24 hr tablet 50 mg, 50 mg, Oral, Daily, Ashok Pall, MD, 50 mg at 09/22/19 1025 .  ondansetron (ZOFRAN) tablet 4 mg, 4 mg, Oral, Q6H PRN **OR** ondansetron (ZOFRAN) injection 4 mg, 4 mg, Intravenous, Q6H PRN, Ashok Pall, MD .  pantoprazole (PROTONIX) EC tablet 40 mg, 40 mg, Oral, QHS, Ashok Pall, MD, 40 mg at 09/21/19 2056 .  pioglitazone (ACTOS) tablet 45 mg, 45 mg, Oral, Daily, Ashok Pall, MD, 45 mg at 09/22/19 1026 .  pravastatin (PRAVACHOL) tablet 40 mg, 40  mg, Oral, Daily, Ashok Pall, MD, 40 mg at 09/22/19 0945 .  prednisoLONE acetate (PRED FORTE) 1 % ophthalmic suspension 1 drop, 1 drop, Left Eye, Q6H, 1 drop at 09/22/19 0947 **AND** prednisoLONE acetate (PRED FORTE) 1 % ophthalmic suspension 1 drop, 1 drop, Right Eye, BID, Ashok Pall, MD, 1 drop at 09/22/19 0948 .  senna (SENOKOT) tablet 8.6 mg, 1 tablet, Oral, BID, Ashok Pall, MD, 8.6 mg at 09/22/19 0945 .  senna-docusate (Senokot-S) tablet 1 tablet, 1 tablet, Oral, QHS PRN, Ashok Pall, MD .  sodium chloride flush (NS) 0.9 % injection 3 mL, 3 mL, Intravenous, Q12H, Ashok Pall, MD, 3 mL at 09/22/19 0948 .  sodium chloride flush (NS) 0.9 % injection 3 mL, 3 mL, Intravenous, PRN, Ashok Pall, MD, 3 mL at 09/20/19 0948 .  zolpidem (AMBIEN) tablet 5 mg, 5 mg, Oral, QHS PRN, Ashok Pall, MD  Patients Current Diet:  Diet Order            DIET - DYS 1 Room service appropriate? Yes; Fluid consistency: Thin  Diet effective now              Precautions / Restrictions Precautions Precautions:  Cervical, Fall Precaution Booklet Issued: Yes (comment) Precaution Comments: reviewd cervial precautions with pt and husband with handout provided Cervical Brace: Hard collar Restrictions Weight Bearing Restrictions: No   Has the patient had 2 or more falls or a fall with injury in the past year? Yes. Pt stated she had a tendency to fall prior to current health issues.  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 Independent  Indoor Mobility: Did the patient need assistance with walking from room to room (with or without device)? Needed some help  Stairs: Did the patient need assistance with internal or external stairs (with or without device)? Independent  Functional Cognition: Did the patient need help planning regular tasks such as shopping or remembering to take medications? [place] / [place] Devices/Equipment: Cane (specify quad or straight), Walker (specify type), Grab bars in shower Home Equipment: Walker - 4 wheels  Prior Device Use: Indicate devices/aids used by the patient prior to current illness, exacerbation or injury? Walker  Current Functional Level Cognition  Overall Cognitive Status: Within Functional Limits for tasks assessed Orientation Level: Oriented X4 General Comments: Pt continues to report anxiety with mobility, but is progressing well.    Extremity Assessment (includes Sensation/Coordination)  Upper Extremity Assessment: RUE deficits/detail, LUE deficits/detail RUE Deficits / Details: grip and UE strength impaired grossly 2+/5 - 3-/5;pt reports numbness and tingling;compensates with use of RUE  LUE Deficits / Details: pt with significant weakness BUE including numbness and tingling in both hands. Reports dropping items  often;demonstrates difficutly using utensils;L hand dominant LUE Sensation: decreased light touch, decreased proprioception LUE Coordination: decreased fine motor, decreased gross motor  Lower Extremity Assessment: Defer to PT evaluation RLE Deficits / Details: ROM: WNL globally. Strength 4/5  (hip flex, hip abd/add. knee flex/ext, DF) RLE Sensation: decreased light touch RLE Coordination: WNL LLE Deficits / Details: ROM: WNL globally. Strength 3+/5  (hip flex, hip abd/add. knee flex/ext, DF) LLE Sensation: decreased light touch LLE Coordination: WNL    ADLs  Overall ADL's : Needs assistance/impaired Eating/Feeding: Set up, Sitting, With adaptive utensils Eating/Feeding Details (indicate cue type and reason): provided pt  with red tubing for built up utensil to promote independence with self feeding using dominant hand Grooming: Wash/dry hands, Wash/dry face, Minimal assistance, Standing Upper Body Bathing: Minimal assistance, Sitting Lower Body Bathing: Maximal assistance, Sit to/from stand, Cueing for safety, Cueing for compensatory techniques, Cueing for sequencing Upper Body Dressing : Minimal assistance, Sitting Lower Body Dressing: Moderate assistance, With adaptive equipment, Cueing for sequencing, Cueing for compensatory techniques Lower Body Dressing Details (indicate cue type and reason): pt able to return demonstrate use of AE (sock aid and reacher to don/doff socks) with modA for sequencing  Toilet Transfer: Minimal assistance, Ambulation, RW, Stand-pivot, Cueing for safety, Cueing for sequencing Toileting- Clothing Manipulation and Hygiene: Maximal assistance, Sit to/from stand, Cueing for compensatory techniques, Cueing for safety, Cueing for sequencing Functional mobility during ADLs: Minimal assistance, Rolling walker, Cueing for safety, Cueing for sequencing General ADL Comments: session focused on feeding and AE to assist with LB dressing    Mobility  Overal bed mobility:  Needs Assistance Bed Mobility: Sit to Supine Supine to sit: Min assist Sit to supine: Mod assist General bed mobility comments: pt sitting in recliner upon arrival    Transfers  Overall transfer level: Needs assistance Equipment used: Rolling walker (2 wheeled) Transfers: Sit to/from Stand Sit to Stand: Mod assist, Min guard Stand pivot transfers: Min assist General transfer comment: deferred pt requesting to eat lunch    Ambulation / Gait / Stairs / Wheelchair Mobility  Ambulation/Gait Ambulation/Gait assistance: Counsellor (Feet): 15 Feet(x3) Assistive device: Rolling walker (2 wheeled) Gait Pattern/deviations: Step-through pattern, Decreased stride length, Trunk flexed, Wide base of support General Gait Details: Pt ambulates slowly with reduced foot clearance and stride length. She requires intermittent cues for upright posture. Gait velocity: decreased Gait velocity interpretation: <1.31 ft/sec, indicative of household ambulator    Posture / Balance Dynamic Sitting Balance Sitting balance - Comments: supervision Balance Overall balance assessment: Needs assistance Sitting-balance support: Single extremity supported, Feet supported Sitting balance-Leahy Scale: Good Sitting balance - Comments: supervision Standing balance support: Bilateral upper extremity supported, During functional activity Standing balance-Leahy Scale: Poor Standing balance comment: reliant on BUE support    Special needs/care consideration BiPAP/CPAP:NA CPM:NA Continuous Drip IV:NA  Dialysis::NA         Life Vest :NA Oxygen:NA  Special Bed:NA  Trach Size:NA Wound Vac (area):NA      Skin NA ; surgical incision Bowel mgmt: continent Bladder mgmt: continent Diabetic mgmt: yes Behavioral consideration:NA  Chemo/radiation:NA Designated visitor is spouse, Simona Huh   Previous Home Environment  Living Arrangements: Spouse/significant other  Lives With: Spouse Available Help at  Discharge: Family Type of Home: House Home Layout: One level Home Access: Stairs to enter Entrance Stairs-Rails: Can reach both Entrance Stairs-Number of Steps: 1 step Bathroom Shower/Tub: Chiropodist: Standard Bathroom Accessibility: Yes How Accessible: Accessible via walker Home Care Services: No Additional Comments: Was using rollator prior to admission  Discharge Living Setting Plans for Discharge Living Setting: Patient's home Type of Home at Discharge: House Discharge Home Layout: One level Discharge Home Access: Stairs to enter Entrance Stairs-Rails: Can reach both Entrance Stairs-Number of Steps: 1 step Discharge Bathroom Shower/Tub: Tub/shower unit Discharge Bathroom Toilet: Standard Discharge Bathroom Accessibility: Yes How Accessible: Accessible via walker Does the patient have any problems obtaining your medications?: No  Social/Family/Support Systems Patient Roles: Spouse, Parent(Pt has a daughter with a disability whom she helps with meds) Anticipated Caregiver: Jennafer Gladue, husband Anticipated Caregiver's Contact Information: [phone] Ability/Limitations of Caregiver: 79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 Discharge Plan Discussed with Primary Caregiver: Yes Is Caregiver In Agreement with Plan?: Yes Does Caregiver/Family have Issues with Lodging/Transportation while Pt is in Rehab?: No  Goals/Additional Needs Patient/Family Goal for Rehab: supervision PT, supervision-mod I OT Expected length of stay: 7-10 days Pt/Family Agrees to Admission and willing to participate: Yes Program Orientation Provided & Reviewed with Pt/Caregiver Including Roles  & Responsibilities: Yes  Decrease burden of Care through IP rehab admission: NA  Possible need for SNF placement upon discharge: no   Patient Condition: I have reviewed medical records from [facility], spoken with CM, patient and husband. I met  with patient at bedside for inpatient rehabilitation assessment.  Patient will benefit from ongoing PT and OT, can actively participate in 3 hours of therapy a day 5 days of the week, and can make measurable gains during the admission.  Patient will also benefit from the coordinated team approach during an Inpatient Acute Rehabilitation admission.  The patient will receive intensive therapy as well as Rehabilitation physician, nursing, social worker, and care management interventions.  Due to safety, disease management, medication administration and patient education the patient requires 24 hour a day rehabilitation nursing.  The patient is currently Min A with mobility and Mod A- Max A  upper body basic ADLs.  Discharge setting and therapy post discharge at home with home health is anticipated.  Patient has agreed to participate in the Acute Inpatient Rehabilitation Program and will admit today.  Preadmission Screen Completed By: Completed by Gayland Curry, MS, CCC-SLP with brief updates by  Annamary Rummage MSN 09/22/2019 11:09 AM ______________________________________________________________________   Discussed status with Dr. Dagoberto Ligas on  09/22/2019 at  52 and received approval for admission today.  Admission Coordinator:  Cleatrice Burke, RN, MSN time  [id number] Date  09/22/2019   Assessment/Plan: Diagnosis: 1. Does the need for close, 24 hr/day Medical supervision in concert with the patient's rehab needs make it unreasonable for this patient to be served in a less intensive setting? Yes 2. Co-Morbidities requiring supervision/potential complications: HTM, DM, myelopathy, CKD with AKI, BMI of 40 3. Due to bladder management, bowel management, safety, skin/wound care, disease management, medication administration, pain management and patient education, does the patient require 24 hr/day rehab nursing? Yes 4. Does the patient require coordinated care of a physician, rehab nurse, PT,  OT, and SLP to address physical and functional deficits in the context of the above medical diagnosis(es)? Yes Addressing deficits in the following areas: balance, endurance, locomotion, strength, transferring, bowel/bladder control, bathing, dressing, feeding, grooming and toileting 5. Can the patient actively participate in an intensive therapy program of at least 3 hrs of therapy 5 days a week? Yes 6. The potential for patient to make measurable gains while on inpatient rehab is fair 7. Anticipated functional outcomes upon discharge from inpatient rehab: supervision and min assist PT, supervision and min assist OT, n/a SLP7-10 days 8. Estimated rehab length of stay to reach the above functional goals is: 7-10 days 9. Anticipated discharge destination: Home 10. Overall Rehab/Functional Prognosis: fair   MD Signature:

## 2019-09-19 NOTE — Plan of Care (Signed)
  Problem: Health Behavior/Discharge Planning: Goal: Ability to manage health-related needs will improve Outcome: Progressing   Problem: Activity: Goal: Risk for activity intolerance will decrease Outcome: Progressing   

## 2019-09-19 NOTE — Progress Notes (Signed)
Inpatient Rehab Admissions:  Inpatient Rehab Consult received.  I met with pt at the bedside for rehabilitation assessment and to discuss goals and expectations of an inpatient rehab admission. Pt acknowledged understanding of CIP goals and expectations. Pt appears to be a good inpatient rehab candidate.  I will begin insurance authorization.  Admission pending insurance authorization.  Signed: Gayland Curry, [place], [place] Admissions Coordinator [phone]

## 2019-09-19 NOTE — Progress Notes (Signed)
Physical Therapy Treatment Patient Details Name: Doris Jackson MRN: [id number] DOB: 1941/03/05 Today's Date: 09/19/2019    History of Present Illness Doris Jackson is a 79 y.o. female with medical history significant of HTN, hyperlipidemia, DM, seasonal allergies, left eye corneal transplant, microcytic anemia, presents to emergency department due to worsening of numbness tingling sensation in both hands and feet. MRI brain and cervical spine was obtained which showed cord compression due to severe spinal stenosis. S/p Anterior Cervical decompression C3-4 (3/18)    PT Comments    Pt continues to make steady progress towards therapeutic goals. Tx focused on LE strengthening, functional transfer training, and education for management of orthostatic hypotension.  Pt's husband is very receptive to education with patient being slightly self limiting with mobility today secondary to "sitting up all day". Pt requires total assist for pericare following toileting today. Pt able to ambulate multiple times around room, but refuses for hall ambulation today. Plan to continue progressing as tolerated. PT will continue following acutely until transfer to next level of care.    Follow Up Recommendations  CIR;Supervision/Assistance - 24 hour     Equipment Recommendations  Other (comment)(defer to CIR)       Precautions / Restrictions Precautions Precautions: Cervical;Fall Precaution Booklet Issued: Yes (comment) Precaution Comments: reviewd cervial precautions with pt and husband with handout provided Required Braces or Orthoses: Cervical Brace Cervical Brace: Hard collar Restrictions Weight Bearing Restrictions: No    Mobility  Bed Mobility Overal bed mobility: Needs Assistance Bed Mobility: Sit to Supine   Sit to supine: Mod assist   General bed mobility comments: modA for LE management into bed, minimal VCs required today for sequencing  Transfers Overall transfer level: Needs  assistance Equipment used: Rolling walker (2 wheeled) Transfers: Sit to/from Stand Sit to Stand: Min guard;Supervision   General transfer comment: Pt required intermittent VCs for sequencing for improved body mechanics for transfers, requied total A for pericare following toilet transfer  Ambulation/Gait Ambulation/Gait assistance: Min guard Gait Distance (Feet): 15 Feet(x2, 5 (fowards and backwards)) Assistive device: Rolling walker (2 wheeled) Gait Pattern/deviations: Step-through pattern;Decreased stride length;Trunk flexed;Wide base of support Gait velocity: decreased   General Gait Details: Pt ambulates slowly with reduced foot clearance and stride length. She requires intermittent cues for upright posture.      Balance Overall balance assessment: Needs assistance Sitting-balance support: Single extremity supported;Feet supported Sitting balance-Leahy Scale: Good Sitting balance - Comments: supervision   Standing balance support: Bilateral upper extremity supported;During functional activity Standing balance-Leahy Scale: Poor        Cognition Arousal/Alertness: Awake/alert Behavior During Therapy: WFL for tasks assessed/performed;Anxious Overall Cognitive Status: Within Functional Limits for tasks assessed      General Comments: Pt reports significant anxiety intermittently about the surgery outcomes. Pt educated on normal healing process and expectations for gradual improvement.      Exercises Other Exercises Other Exercises: sit to stand x 5    General Comments General comments (skin integrity, edema, etc.): Pt reported increased hypersensitivity in RLE with stockings, required assistance to unroll TED hose to allow for improved tolerance.      Pertinent Vitals/Pain Pain Assessment: 0-10 Pain Score: 3  Pain Location: neck at incision; throat during swallowing Pain Descriptors / Indicators: Grimacing;Operative site guarding;Sore           PT Goals  (current goals can now be found in the care plan section) Acute Rehab PT Goals Patient Stated Goal: get well PT Goal Formulation: With patient Time  For Goal Achievement: 09/30/19 Potential to Achieve Goals: Good Progress towards PT goals: Progressing toward goals    Frequency    Min 5X/week      PT Plan Current plan remains appropriate       AM-PAC PT "6 Clicks" Mobility   Outcome Measure  Help needed turning from your back to your side while in a flat bed without using bedrails?: A Little Help needed moving from lying on your back to sitting on the side of a flat bed without using bedrails?: A Little Help needed moving to and from a bed to a chair (including a wheelchair)?: A Little Help needed standing up from a chair using your arms (e.g., wheelchair or bedside chair)?: A Little Help needed to walk in hospital room?: A Lot Help needed climbing 3-5 steps with a railing? : A Lot 6 Click Score: 16    End of Session Equipment Utilized During Treatment: Gait belt;Cervical collar Activity Tolerance: Patient tolerated treatment well Patient left: with family/visitor present;in bed;with bed alarm set;with call bell/phone within reach;with SCD's reapplied Nurse Communication: Mobility status PT Visit Diagnosis: Unsteadiness on feet (R26.81);Other abnormalities of gait and mobility (R26.89);Muscle weakness (generalized) (M62.81)     Time: [id number] PT Time Calculation (min) (ACUTE ONLY): 24 min  Charges:  $Therapeutic Exercise: 8-22 mins $Therapeutic Activity: 8-22 mins                     Ann Held PT, DPT Acute Rehab [facility] P: [phone]    Ketara Cavness A Maely Clements 09/19/2019, 1:41 PM

## 2019-09-19 NOTE — Progress Notes (Signed)
  Speech Language Pathology Treatment: Dysphagia  Patient Details Name: Doris Jackson MRN: [id number] DOB: 1940-11-11 Today's Date: 09/19/2019 Time: [id number] SLP Time Calculation (min) (ACUTE ONLY): 17 min  Assessment / Plan / Recommendation Clinical Impression  No indication of airway compromise with po observed including thin liquids and pureed foods, pt declined to consume solids.  Pt declined to consume advanced diet - stating she is "being careful" and that she "got a whole baked potato yesterday" that she stated she couldn't eat.  Also states her nurses are crushing her pills for her.  Pt reports coughing up secretions after meals - has h/o reflux disease.  She also reports odynophagia but this has improved over the weekend.    Advised pt to start all intake with liquids to moisten oropharyngeal region and maximize propulsion of boluses through oropharynx.    Given pt aware of her swallowing, worked as a NT, and has improvement, advised pt to advance diet as she sees fit/tolerated.  No SLP follow up indicated at this time as acute short term dysphagia likely due to edema has improved.      HPI HPI: Doris Jackson is a 79 y.o. female with medical history significant of HTN, hyperlipidemia, DM, seasonal allergies, left eye corneal transplant, microcytic anemia, presents to emergency department due to worsening of numbness tingling sensation in both hands and feet. MRI brain and cervical spine was obtained which showed cord compression due to severe spinal stenosis. S/p Anterior Cervical decompression C3-4 (3/18)      SLP Plan  All goals met       Recommendations  Diet recommendations: Other(comment)(advance diet as pt desires) Liquids provided via: Straw;Cup Supervision: Intermittent supervision to cue for compensatory strategies;Patient able to self feed Compensations: Slow rate;Small sips/bites(advise pt start meals with liquids to help oral propulsion strength) Postural Changes  and/or Swallow Maneuvers: Seated upright 90 degrees;Upright 30-60 min after meal                Oral Care Recommendations: Oral care BID Follow up Recommendations: None SLP Visit Diagnosis: Dysphagia, unspecified (R13.10) Plan: All goals met       GO              Kathleen Lime, MS [facility] SLP Acute Rehab Services Office [phone]   Macario Golds 09/19/2019, 7:54 AM

## 2019-09-19 NOTE — Plan of Care (Signed)
  Problem: Pain Managment: Goal: General experience of comfort will improve Outcome: Progressing   Problem: Pain Managment: Goal: General experience of comfort will improve Outcome: Progressing   

## 2019-09-20 LAB — GLUCOSE, CAPILLARY
Glucose-Capillary: 126 mg/dL — ABNORMAL HIGH (ref 70–99)
Glucose-Capillary: 190 mg/dL — ABNORMAL HIGH (ref 70–99)
Glucose-Capillary: 247 mg/dL — ABNORMAL HIGH (ref 70–99)
Glucose-Capillary: 129 mg/dL — ABNORMAL HIGH (ref 70–99)

## 2019-09-20 NOTE — Progress Notes (Signed)
Physical Therapy Treatment Patient Details Name: Doris Jackson MRN: [id number] DOB: Nov 04, 1940 Today's Date: 09/20/2019    History of Present Illness Doris Jackson is a 79 y.o. female with medical history significant of HTN, hyperlipidemia, DM, seasonal allergies, left eye corneal transplant, microcytic anemia, presents to emergency department due to worsening of numbness tingling sensation in both hands and feet. MRI brain and cervical spine was obtained which showed cord compression due to severe spinal stenosis. S/p Anterior Cervical decompression C3-4 (3/18)    PT Comments    Pt continues to make consistent progress towards PT goals. She was able to progress with improved gait distance today with RW and gait belt (CGA/supervision), and states tomorrow she thinks she will be able to walk the whole distance of the room. Pt continues to be limited by anxiety, stating she almost had a panic attack last night with her SCDs on because she couldn't move.  Pt was able to perform seated exercise with minimal cueing requiring most VCs for improved eccentric control with functional movements and exercises today. PT plan to continue following patient until transition to next level of care to address continued functional deficits.    Follow Up Recommendations  CIR;Supervision/Assistance - 24 hour     Equipment Recommendations  Other (comment)    Recommendations for Other Services Speech consult     Precautions / Restrictions Precautions Precautions: Cervical;Fall Precaution Booklet Issued: Yes (comment) Precaution Comments: reviewd cervial precautions with pt and husband with handout provided Required Braces or Orthoses: Cervical Brace Cervical Brace: Hard collar Restrictions Weight Bearing Restrictions: No    Mobility  Bed Mobility Overal bed mobility: Needs Assistance Bed Mobility: Sit to Supine     Supine to sit: Min assist     General bed mobility comments: minA for LE  management out of bed, minimal VCs required today for sequencing  Transfers Overall transfer level: Needs assistance Equipment used: Rolling walker (2 wheeled) Transfers: Sit to/from Stand Sit to Stand: Mod assist;Min guard         General transfer comment: Pt required modA from couch height for initial stand sequence. Continues to require intermittent VCs for sequencing for improved body mechanics and reduced caregiver burden.  Ambulation/Gait Ambulation/Gait assistance: Min guard Gait Distance (Feet): 15 Feet(x3) Assistive device: Rolling walker (2 wheeled) Gait Pattern/deviations: Step-through pattern;Decreased stride length;Trunk flexed;Wide base of support Gait velocity: decreased   General Gait Details: Pt ambulates slowly with reduced foot clearance and stride length. She requires intermittent cues for upright posture.       Balance Overall balance assessment: Needs assistance Sitting-balance support: Single extremity supported;Feet supported Sitting balance-Leahy Scale: Good Sitting balance - Comments: supervision   Standing balance support: Bilateral upper extremity supported;During functional activity Standing balance-Leahy Scale: Poor Standing balance comment: reliant on BUE support      Cognition Arousal/Alertness: Awake/alert Behavior During Therapy: WFL for tasks assessed/performed;Anxious Overall Cognitive Status: Within Functional Limits for tasks assessed        General Comments: Pt continues to report anxiety with mobility, but is progressing well.      Exercises Other Exercises Other Exercises: sit to stand x 5    General Comments General comments (skin integrity, edema, etc.): Pt continues to report sensitivity in RLE with TED hose, requiring assistance for self management      Pertinent Vitals/Pain Pain Assessment: Faces Faces Pain Scale: Hurts little more Pain Location: neck at incision; throat during swallowing Pain Descriptors /  Indicators: Grimacing;Operative site guarding;Sore  PT Goals (current goals can now be found in the care plan section) Acute Rehab PT Goals Patient Stated Goal: Get stronger and more mobile PT Goal Formulation: With patient Time For Goal Achievement: 09/30/19 Potential to Achieve Goals: Good Progress towards PT goals: Progressing toward goals    Frequency    Min 5X/week      PT Plan Current plan remains appropriate       AM-PAC PT "6 Clicks" Mobility   Outcome Measure  Help needed turning from your back to your side while in a flat bed without using bedrails?: A Little Help needed moving from lying on your back to sitting on the side of a flat bed without using bedrails?: A Little Help needed moving to and from a bed to a chair (including a wheelchair)?: A Little Help needed standing up from a chair using your arms (e.g., wheelchair or bedside chair)?: A Little Help needed to walk in hospital room?: A Lot Help needed climbing 3-5 steps with a railing? : A Lot 6 Click Score: 16    End of Session Equipment Utilized During Treatment: Gait belt;Cervical collar Activity Tolerance: Patient tolerated treatment well Patient left: with call bell/phone within reach;with SCD's reapplied;in chair;with chair alarm set Nurse Communication: Mobility status PT Visit Diagnosis: Unsteadiness on feet (R26.81);Other abnormalities of gait and mobility (R26.89);Muscle weakness (generalized) (M62.81)     Time: [id number] PT Time Calculation (min) (ACUTE ONLY): 31 min  Charges:  $Gait Training: 8-22 mins $Therapeutic Activity: 8-22 mins                     Ann Held PT, DPT Acute Rehab [facility] P: [phone]    Szymon Foiles A Jayzen Paver 09/20/2019, 1:16 PM

## 2019-09-20 NOTE — Progress Notes (Signed)
Inpatient Rehab Admissions:  Followed up with pt to inform her that insurance approval is pending.  Explained insurance benefits to her and her husband. Both acknowledged understanding.  Provided information regarding CIR to husband. He acknowledged understanding of CIR goals and expectations.   Gayland Curry, [place], [place] Admissions Coordinator [phone]

## 2019-09-20 NOTE — TOC Progression Note (Signed)
Transition of Care [facility]) - Progression Note    Patient Details  Name: PRAPTI GRUSSING MRN: [id number] Date of Birth: [birth year]  [facility]) CM/SW Foster Brook, RN Phone Number: 09/20/2019, 1:53 PM  Clinical Narrative:  Case management spoke with Lind Covert with CIR, and insurance authorization started on 09/19/19 and waiting on approval.  Beds in [place] are available so patient will be able to transfer to CIR probably on 09/21/19 if approved. Barriers to discharge or conditions that needs to be met prior to discharge: pending CIR bed/insurance authorization.  Unsafe discharge home at this time as she remains 2+ assist with ADLs which is far from her normal baseline of independence.    Expected Discharge Plan: [place] Barriers to Discharge: No Barriers Identified  Expected Discharge Plan and Services Expected Discharge Plan: [place]   Discharge Planning Services: CM Consult Post Acute Care Choice: [place] arrangements for the past 2 months: Single Family Home                                       Social Determinants of Health (SDOH) Interventions    Readmission Risk Interventions Readmission Risk Prevention Plan 09/16/2019  Transportation Screening Complete  PCP or Specialist Appt within 3-5 Days Complete  HRI or [place] Complete  Social Work Consult for [place] Planning/Counseling Complete  Palliative Care Screening Not Applicable  Medication Review [employer]) Complete  Some recent data might be hidden

## 2019-09-20 NOTE — Progress Notes (Signed)
PROGRESS NOTE    [name]  QIH:[id number] DOB: [birth year] DOA: 09/14/2019 PCP: Caren Macadam, MD    Brief Narrative:  Doris Jackson is a 79 year old female with a history of diabetes, hypertension, hyperlipidemia, presents to the emergency room with worsening numbness and tingling in her hands and feet bilaterally.  MRI of the brain and cervical spine was recently performed approximately 4 days prior to admission when she had visited the emergency room.  At that time, showed cord compression due to severe spinal stenosis.  She was advised for admission and surgery, but she refused at that time since she wanted to discuss this further with her family.  She returned to the hospital with worsening symptoms and was admitted to the hospital for operative management.  She was evaluated by neurosurgery and underwent surgical management on 3/18.   Assessment & Plan:   Principal Problem:   Spinal stenosis in cervical region Active Problems:   DM (diabetes mellitus) type II controlled with renal manifestation (HCC)   Dyslipidemia   Microcytic anemia   Essential hypertension   Obesity, morbid, BMI 40.0-49.9 (HCC)   AKI (acute kidney injury) (HCC)   GERD (gastroesophageal reflux disease)   Numbness and tingling   Cervical spinal stenosis   Cervical spondylosis with myelopathy and radiculopathy   Cervical spinal stenosis with cord compression Patient presented with weakness and paresthesias to bilateral upper extremities, left greater than right.  Was noted to have significant cervical spinal stenosis with cord compression on imaging.  Evaluated by neurosurgery and underwent anterior cervical decompression with fusion by neurosurgery, Dr. Christella Noa on 09/15/2019.  Completed course of Decadron.  PT and OT recommend CIR placement to obtain independence back to her previous baseline; currently requiring max assist with much of her ADLs currently in which she was very independent at baseline  prior to surgical intervention. --Further postoperative care per neurosurgery --Continue therapy while inpatient --Currently tolerating pured diet --Pending CIR Bed, insurance authorization  Acute renal failure: resolved Creatinine 1.78 on admission (baseline 1.2), likely secondary to prerenal azotemia from poor oral intake in the days preceding hospitalization.  Received IV fluid hydration with improvement of creatinine to 1.18. --Avoid nephrotoxins, renally dose all medications  Essential hypertension BP 143/74 this morning. --continue home amlodipine-benazepril 5-65m PO daily, HCTZ 254mPO daily, metoprolol succinate 5077mO daily  Type 2 diabetes mellitus On Januvia 100 mg p.o. daily, Actos 45 mg p.o. daily, Metformin 500 mg p.o. twice daily, glipizide 5 mg p.o. daily at home.  Hemoglobin A1c 7.0 on 08/16/2019, well controlled. --Glipizide and Actos --Lantus 10 unit subcutaneously daily --Insulin sliding scale for further coverage  GERD: Continue PPI  Microcytic anemia: Continue iron supplementation 150 mg p.o. daily  Hyperlipidemia: Continue statin   DVT prophylaxis: SCDs Code Status: Full code Family Communication: Updated patient at bedside Disposition Plan:      Patient is from: Home with spouse     Anticipated Disposition:  CIR     Barriers to discharge or conditions that needs to be met prior to discharge: pending CIR bed/insurance authorization.  Unsafe discharge home at this time as she remains 2+ assist with ADLs which is far from her normal baseline of independence.  Consultants:   Neurosurgery - Dr. CabChristella Noarocedures:  Anterior Cervical decompression C3/4 C5 cervical corpectomy Arthrodesis C3-4with 9mm65mructural allograft Arthrodesis C4-6 24mm60mk cage with autograft morsels Anterior instrumentation(Nuvasive ACP) C3-6  Antimicrobials:   none   Subjective: Patient seen and examined bedside, resting  comfortably.  Eating breakfast.  Tolerating  pured diet.  Continues with mild weakness and paresthesias. No other specific complaints at this time.  Continues to require significant assistance with activities of daily living, which is different than her normal baseline of independence.  Denies headache, no fever/chills/night sweats, no nausea/vomiting/diarrhea, no chest pain, no palpitations, no shortness of breath, no abdominal pain.  No other acute events overnight per nursing staff.  Objective: Vitals:   09/19/19 0858 09/19/19 1949 09/20/19 0312 09/20/19 0500  BP: 110/80 140/75 123/77   Pulse: 93 98 90   Resp: '16 17 16   ' Temp: 98.4 F (36.9 C) 98.6 F (37 C) 98.2 F (36.8 C)   TempSrc: Oral Oral Oral   SpO2: 98% 100% 99%   Weight:    99.3 kg  Height:        Intake/Output Summary (Last 24 hours) at 09/20/2019 0820 Last data filed at 09/19/2019 2110 Gross per 24 hour  Intake 723 ml  Output --  Net 723 ml   Filed Weights   09/15/19 1434 09/18/19 0500 09/20/19 0500  Weight: 99.8 kg 98.9 kg 99.3 kg    Examination:  General exam: Appears calm and comfortable  Respiratory system: Clear to auscultation. Respiratory effort normal. Cardiovascular system: S1 & S2 heard, RRR. No JVD, murmurs, rubs, gallops or clicks. No pedal edema. Gastrointestinal system: Abdomen is nondistended, soft and nontender. No organomegaly or masses felt. Normal bowel sounds heard. Central nervous system: Alert and oriented. No focal neurological deficits. Extremities: Symmetric 5 x 5 power. Skin: No rashes, lesions or ulcers, noted anterior neck surgical incision wound, clean/dry/intact without erythema/fluctuance Psychiatry: Judgement and insight appear normal. Mood & affect appropriate.     Data Reviewed: I have personally reviewed following labs and imaging studies  CBC: Recent Labs  Lab 09/14/19 1307 09/15/19 0328 09/16/19 0248  WBC 10.0 9.7 11.4*  HGB 11.6* 10.6* 8.8*  HCT 34.8* 31.5* 26.0*  MCV 78.9* 79.5* 78.8*  PLT 167 155 124*    Basic Metabolic Panel: Recent Labs  Lab 09/14/19 1307 09/14/19 1307 09/14/19 1422 09/15/19 0328 09/16/19 0248 09/16/19 0422 09/17/19 0346 09/19/19 1230  NA 138  --   --  143 143 141 139  --   K 4.1  --   --  3.8 7.0* 4.9 4.2  --   CL 100  --   --  106 114* 105 102  --   CO2 23  --   --  25 20* 24 26  --   GLUCOSE 247*   < >  --  144* 191* 221* 169* 209*  BUN 65*  --   --  50* 28* 34* 29*  --   CREATININE 1.78*  --   --  1.51* 1.03* 1.17* 1.18*  --   CALCIUM 9.1  --   --  8.9 7.3* 8.5* 8.5*  --   MG  --   --  1.8  --   --   --   --   --    < > = values in this interval not displayed.   GFR: Estimated Creatinine Clearance: 44 mL/min (A) (by C-G formula based on SCr of 1.18 mg/dL (H)). Liver Function Tests: Recent Labs  Lab 09/14/19 1307  AST 14*  ALT 14  ALKPHOS 62  BILITOT 0.5  PROT 7.4  ALBUMIN 3.5   No results for input(s): LIPASE, AMYLASE in the last 168 hours. No results for input(s): AMMONIA in the last 168 hours. Coagulation  Profile: Recent Labs  Lab 09/15/19 1118  INR 1.0   Cardiac Enzymes: No results for input(s): CKTOTAL, CKMB, CKMBINDEX, TROPONINI in the last 168 hours. BNP (last 3 results) No results for input(s): PROBNP in the last 8760 hours. HbA1C: No results for input(s): HGBA1C in the last 72 hours. CBG: Recent Labs  Lab 09/19/19 0642 09/19/19 1124 09/19/19 1616 09/19/19 1953 09/20/19 0641  GLUCAP 150* 566* 113* 317* 126*   Lipid Profile: No results for input(s): CHOL, HDL, LDLCALC, TRIG, CHOLHDL, LDLDIRECT in the last 72 hours. Thyroid Function Tests: No results for input(s): TSH, T4TOTAL, FREET4, T3FREE, THYROIDAB in the last 72 hours. Anemia Panel: No results for input(s): VITAMINB12, FOLATE, FERRITIN, TIBC, IRON, RETICCTPCT in the last 72 hours. Sepsis Labs: No results for input(s): PROCALCITON, LATICACIDVEN in the last 168 hours.  Recent Results (from the past 240 hour(s))  SARS CORONAVIRUS 2 (TAT 6-24 HRS) Nasopharyngeal  Nasopharyngeal Swab     Status: None   Collection Time: 09/14/19  2:13 PM   Specimen: Nasopharyngeal Swab  Result Value Ref Range Status   SARS Coronavirus 2 NEGATIVE NEGATIVE Final    Comment: (NOTE) SARS-CoV-2 target nucleic acids are NOT DETECTED. The SARS-CoV-2 RNA is generally detectable in upper and lower respiratory specimens during the acute phase of infection. Negative results do not preclude SARS-CoV-2 infection, do not rule out co-infections with other pathogens, and should not be used as the sole basis for treatment or other patient management decisions. Negative results must be combined with clinical observations, patient history, and epidemiological information. The expected result is Negative. Fact Sheet for Patients: SugarRoll.be Fact Sheet for Healthcare Providers: https://www.woods-mathews.com/ This test is not yet approved or cleared by the [place] FDA and  has been authorized for detection and/or diagnosis of SARS-CoV-2 by FDA under an Emergency Use Authorization (EUA). This EUA will remain  in effect (meaning this test can be used) for the duration of the COVID-19 declaration under Section 56 4(b)(1) of the Act, 21 U.S.C. section 360bbb-3(b)(1), unless the authorization is terminated or revoked sooner. Performed at [facility], [place] [address], [place], [place] [address]   Surgical pcr screen     Status: None   Collection Time: 09/15/19 10:34 AM   Specimen: Nasal Mucosa; Nasal Swab  Result Value Ref Range Status   MRSA, PCR NEGATIVE NEGATIVE Final   Staphylococcus aureus NEGATIVE NEGATIVE Final    Comment: (NOTE) The Xpert SA Assay (FDA approved for NASAL specimens in patients 73 years of age and older), is one component of a comprehensive surveillance program. It is not intended to diagnose infection nor to guide or monitor treatment. Performed at [facility], [place] [address], [place],  [place] [address]          Radiology Studies: No results found.      Scheduled Meds: . amLODipine  5 mg Oral Daily   And  . benazepril  40 mg Oral Daily  . cholecalciferol  2,000 Units Oral Daily  . feeding supplement (GLUCERNA SHAKE)  237 mL Oral BID BM  . glipiZIDE  5 mg Oral Daily  . hydrochlorothiazide  25 mg Oral Daily  . insulin aspart  0-15 Units Subcutaneous TID WC  . insulin aspart  0-5 Units Subcutaneous QHS  . insulin glargine  10 Units Subcutaneous Daily  . iron polysaccharides  150 mg Oral Daily  . loratadine  10 mg Oral Daily  . metoprolol succinate  50 mg Oral Daily  . pantoprazole  40 mg Oral QHS  . pioglitazone  45 mg Oral Daily  . pravastatin  40 mg Oral Daily  . prednisoLONE acetate  1 drop Left Eye Q6H   And  . prednisoLONE acetate  1 drop Right Eye BID  . senna  1 tablet Oral BID  . sodium chloride flush  3 mL Intravenous Q12H   Continuous Infusions: . sodium chloride       LOS: 6 days    Time spent: 34 minutes spent on chart review, discussion with nursing staff, consultants, updating family and interview/physical exam; more than 50% of that time was spent in counseling and/or coordination of care.    Topanga Alvelo J [place] DO [facility] Available via Epic secure chat 7am-7pm After these hours, please refer to coverage provider listed on [email or web address] 09/20/2019, 8:20 AM

## 2019-09-20 NOTE — Progress Notes (Signed)
Occupational Therapy Treatment Patient Details Name: Doris Jackson MRN: [id number] DOB: Feb 28, 1941 Today's Date: 09/20/2019    History of present illness Doris Jackson is a 79 y.o. female with medical history significant of HTN, hyperlipidemia, DM, seasonal allergies, left eye corneal transplant, microcytic anemia, presents to emergency department due to worsening of numbness tingling sensation in both hands and feet. MRI brain and cervical spine was obtained which showed cord compression due to severe spinal stenosis. S/p Anterior Cervical decompression C3-4 (3/18)   OT comments  Today's session focused on use of AE to maximize pt's independence with LB dressing and feeding. Pt demonstrates weakness and numbness in bilateral hands (left weaker than right). Pt is left hand dominant and demonstrates compensatory strategies with self-feeding utilizing RUE. Provided pt with built up handles to maximize independence. Pt required modA to return demonstrate use of AE to assist with donning/doffing socks. Educated pt on fine motor coordination activities and HEP with provided handouts and soft (yellow) theraputty. Pt will continue to benefit from skilled OT services to maximize safety and independence with ADL/IADL and functional mobility. Will continue to follow acutely and progress as tolerated.    Follow Up Recommendations  CIR    Equipment Recommendations  Other (comment);3 in 1 bedside commode    Recommendations for Other Services Rehab consult    Precautions / Restrictions Precautions Precautions: Cervical;Fall Precaution Booklet Issued: Yes (comment) Precaution Comments: reviewd cervial precautions with pt and husband with handout provided Required Braces or Orthoses: Cervical Brace Cervical Brace: Hard collar Restrictions Weight Bearing Restrictions: No       Mobility Bed Mobility Overal bed mobility: Needs Assistance Bed Mobility: Sit to Supine     Supine to sit: Min assist      General bed mobility comments: pt sitting in recliner upon arrival  Transfers Overall transfer level: Needs assistance Equipment used: Rolling walker (2 wheeled) Transfers: Sit to/from Stand Sit to Stand: Mod assist;Min guard         General transfer comment: deferred pt requesting to eat lunch    Balance Overall balance assessment: Needs assistance Sitting-balance support: Single extremity supported;Feet supported Sitting balance-Leahy Scale: Good Sitting balance - Comments: supervision   Standing balance support: Bilateral upper extremity supported;During functional activity Standing balance-Leahy Scale: Poor Standing balance comment: reliant on BUE support                           ADL either performed or assessed with clinical judgement   ADL Overall ADL's : Needs assistance/impaired Eating/Feeding: Set up;Sitting;With adaptive utensils Eating/Feeding Details (indicate cue type and reason): provided pt with red tubing for built up utensil to promote independence with self feeding using dominant hand                 Lower Body Dressing: Moderate assistance;With adaptive equipment;Cueing for sequencing;Cueing for compensatory techniques Lower Body Dressing Details (indicate cue type and reason): pt able to return demonstrate use of AE (sock aid and reacher to don/doff socks) with modA for sequencing                General ADL Comments: session focused on feeding and AE to assist with LB dressing     Vision       Perception     Praxis      Cognition Arousal/Alertness: Awake/alert Behavior During Therapy: [facility] for tasks assessed/performed;Anxious Overall Cognitive Status: Within Functional Limits for tasks assessed  General Comments: Pt continues to report anxiety with mobility, but is progressing well.        Exercises Exercises: Other exercises General Exercises - Lower Extremity Long  Arc Quad: AROM;Both;20 reps Hip Flexion/Marching: AROM;Both;15 reps Heel Raises: AROM;Both;15 reps Other Exercises Other Exercises: provided pt with fine motor coordination activities HEP  Other Exercises: educated pt on theraputty (yellow - soft) fine motor exercises with provided handout   Shoulder Instructions       General Comments HR 90s-108bpm     Pertinent Vitals/ Pain       Pain Assessment: Faces Faces Pain Scale: Hurts little more Pain Location: neck at incision; throat during swallowing Pain Descriptors / Indicators: Grimacing;Operative site guarding;Sore Pain Intervention(s): Limited activity within patient's tolerance;Monitored during session  Home Living                                          Prior Functioning/Environment              Frequency  Min 2X/week        Progress Toward Goals  OT Goals(current goals can now be found in the care plan section)  Progress towards OT goals: Progressing toward goals  Acute Rehab OT Goals Patient Stated Goal: Get stronger and more mobile OT Goal Formulation: With patient/family Time For Goal Achievement: 09/30/19 Potential to Achieve Goals: Good ADL Goals Pt Will Perform Grooming: with min guard assist;standing;with caregiver independent in assisting Pt Will Perform Upper Body Bathing: with min guard assist;with supervision;with set-up;with caregiver independent in assisting Pt Will Perform Lower Body Bathing: with mod assist;with adaptive equipment;with caregiver independent in assisting Pt Will Perform Upper Body Dressing: with min guard assist;with supervision;with set-up;sitting;with caregiver independent in assisting Pt Will Perform Lower Body Dressing: with mod assist;with adaptive equipment;with caregiver independent in assisting Pt Will Transfer to Toilet: with min guard assist;ambulating Pt Will Perform Toileting - Clothing Manipulation and hygiene: with min assist;with caregiver  independent in assisting Additional ADL Goal #1: Pt will recall cervical precautions and maintain during ADLs and ADL mobility  Plan Discharge plan remains appropriate    Co-evaluation                 AM-PAC OT "6 Clicks" Daily Activity     Outcome Measure   Help from another person eating meals?: A Little Help from another person taking care of personal grooming?: A Little Help from another person toileting, which includes using toliet, bedpan, or urinal?: A Lot Help from another person bathing (including washing, rinsing, drying)?: A Little Help from another person to put on and taking off regular upper body clothing?: A Lot Help from another person to put on and taking off regular lower body clothing?: A Lot 6 Click Score: 15    End of Session    OT Visit Diagnosis: Unsteadiness on feet (R26.81);Muscle weakness (generalized) (M62.81);History of falling (Z91.81)   Activity Tolerance Patient tolerated treatment well   Patient Left in chair;with call bell/phone within reach;with chair alarm set;with family/visitor present   Nurse Communication Mobility status        Time: [id number] OT Time Calculation (min): 38 min  Charges: OT General Charges $OT Visit: 1 Visit OT Treatments $Self Care/Home Management : 38-52 mins  Helene Kelp OTR/L Acute Rehabilitation Services Office: [place] 09/20/2019, 3:29 PM

## 2019-09-20 NOTE — Progress Notes (Signed)
Patient ID: Doris Jackson, female   DOB: Jan 03, 1941, 79 y.o.   MRN: [id number]  BP 136/71 (BP Location: Left Arm)   Pulse 95   Temp 97.8 F (36.6 C) (Oral)   Resp 18   Ht 5' 2.01" (1.575 m)   Wt 99.3 kg   SpO2 100%   BMI 40.03 kg/m   Wound is clean, dry Voice is strong Still awaiting placement. Seems appropriate for CIR.

## 2019-09-21 ENCOUNTER — Ambulatory Visit: Payer: Medicare HMO | Admitting: Physical Therapy

## 2019-09-21 DIAGNOSIS — M4712 Other spondylosis with myelopathy, cervical region: Secondary | ICD-10-CM

## 2019-09-21 DIAGNOSIS — G959 Disease of spinal cord, unspecified: Secondary | ICD-10-CM

## 2019-09-21 DIAGNOSIS — M4722 Other spondylosis with radiculopathy, cervical region: Secondary | ICD-10-CM

## 2019-09-21 LAB — GLUCOSE, CAPILLARY
Glucose-Capillary: 135 mg/dL — ABNORMAL HIGH (ref 70–99)
Glucose-Capillary: 143 mg/dL — ABNORMAL HIGH (ref 70–99)
Glucose-Capillary: 165 mg/dL — ABNORMAL HIGH (ref 70–99)
Glucose-Capillary: 245 mg/dL — ABNORMAL HIGH (ref 70–99)
Glucose-Capillary: 295 mg/dL — ABNORMAL HIGH (ref 70–99)

## 2019-09-21 NOTE — Progress Notes (Signed)
Inpatient Rehab Admissions:  Received insurance approval for CIR admission.  Awaiting bed availability.  Informed pt of CIR status.  Pt acknowledged understanding.  Gayland Curry, [place], [place] Admissions Coordinator [phone]

## 2019-09-21 NOTE — Progress Notes (Signed)
PROGRESS NOTE    Doris Jackson  [id number] DOB: Aug 13, 1940 DOA: 09/14/2019 PCP: Caren Macadam, MD    Brief Narrative:  79yo with a hx of DM, HTN, HLD presented with symptomatic cord compression due to severe spinal stenosis, now s/p surgery per Neurosurgery. Awaiting CIR placement  Assessment & Plan:   Principal Problem:   Spinal stenosis in cervical region Active Problems:   DM (diabetes mellitus) type II controlled with renal manifestation (HCC)   Dyslipidemia   Microcytic anemia   Essential hypertension   Obesity, morbid, BMI 40.0-49.9 (HCC)   AKI (acute kidney injury) (HCC)   GERD (gastroesophageal reflux disease)   Numbness and tingling   Cervical spinal stenosis   Cervical spondylosis with myelopathy and radiculopathy  Cervical spinal stenosis with cord compression Patient presented with weakness and paresthesias to bilateral upper extremities, left greater than right.  Was noted to have significant cervical spinal stenosis with cord compression on imaging.  Evaluated by neurosurgery and underwent anterior cervical decompression with fusion by neurosurgery, Dr. Christella Noa on 09/15/2019.  Completed course of Decadron.  PT and OT recommend CIR placement to obtain independence back to her previous baseline; currently requiring max assist with much of her ADLs currently in which she was very independent at baseline prior to surgical intervention. --Neurosurgery following. As of 3/23, seems appropriate for CIR --Continue therapy while inpatient --Currently tolerating pured diet --Currently awaiting bed availability. Received insurance authorization  Acute renal failure: resolved Creatinine 1.78 on admission (baseline 1.2), likely secondary to prerenal azotemia from poor oral intake in the days preceding hospitalization.  Received IV fluid hydration with improvement of creatinine to 1.18 on most recent check --Cont to avoid nephrotoxins, renally dose all  medications  Essential hypertension --continue home amlodipine-benazepril 5-40mg  PO daily, HCTZ 25mg  PO daily, metoprolol succinate 50mg  PO daily -BP stable and controlled at this time  Type 2 diabetes mellitus On Januvia 100 mg p.o. daily, Actos 45 mg p.o. daily, Metformin 500 mg p.o. twice daily, glipizide 5 mg p.o. daily at home.  Hemoglobin A1c 7.0 on 08/16/2019, well controlled. --Glipizide and Actos --Lantus 10 unit subcutaneously daily --Insulin sliding scale for further coverage --glucose trends reviewed. For the most part, controlled with transient value in the 200's noted. Will cont to follow  GERD: Continue PPI as tolerated  Microcytic anemia: Continue iron supplementation 150 mg p.o. daily  Hyperlipidemia: Continue statin as toelrated  DVT prophylaxis: SCD's Code Status: Full Family Communication: Pt in room, family not at bedside Disposition Plan: From home, pending CIR awaiting bed availability  Consultants:   Neurosurgery  Procedures:   Anterior cervical decompression with fusion 3/18  Antimicrobials: Anti-infectives (From admission, onward)   Start     Dose/Rate Route Frequency Ordered Stop   09/15/19 1430  ceFAZolin (ANCEF) 3 g in dextrose 5 % 50 mL IVPB     3 g 100 mL/hr over 30 Minutes Intravenous 30 min pre-op 09/15/19 1430 09/15/19 1651       Subjective: Denies sob, feeling weak. Eager to start therapy  Objective: Vitals:   09/20/19 1925 09/21/19 0505 09/21/19 0743 09/21/19 1437  BP: 136/71 112/77 135/69 (!) 111/58  Pulse: 95 71 94 88  Resp: 18 16 19 16   Temp: 97.8 F (36.6 C) 98.4 F (36.9 C) 98.5 F (36.9 C) 97.9 F (36.6 C)  TempSrc: Oral Oral Oral Oral  SpO2: 100% 95% 96% 99%  Weight:      Height:        Intake/Output Summary (  Last 24 hours) at 09/21/2019 1636 Last data filed at 09/21/2019 1300 Gross per 24 hour  Intake 720 ml  Output 1 ml  Net 719 ml   Filed Weights   09/15/19 1434 09/18/19 0500 09/20/19 0500  Weight:  99.8 kg 98.9 kg 99.3 kg    Examination:  General exam: Appears calm and comfortable, sitting in chair Respiratory system: Clear to auscultation. Respiratory effort normal. Cardiovascular system: S1 & S2 heard, Regular Gastrointestinal system: Abdomen is nondistended, soft and nontender. No organomegaly or masses felt. Normal bowel sounds heard. Central nervous system: Alert and oriented. No focal neurological deficits. Extremities: Symmetric 5 x 5 power. Skin: No rashes, lesions  Psychiatry: Judgement and insight appear normal. Mood & affect appropriate.   Data Reviewed: I have personally reviewed following labs and imaging studies  CBC: Recent Labs  Lab 09/15/19 0328 09/16/19 0248  WBC 9.7 11.4*  HGB 10.6* 8.8*  HCT 31.5* 26.0*  MCV 79.5* 78.8*  PLT 155 [id number]*   Basic Metabolic Panel: Recent Labs  Lab 09/15/19 0328 09/16/19 0248 09/16/19 0422 09/17/19 0346 09/19/19 1230  NA 143 143 141 139  --   K 3.8 7.0* 4.9 4.2  --   CL 106 114* 105 102  --   CO2 25 20* 24 26  --   GLUCOSE 144* 191* 221* 169* 209*  BUN 50* 28* 34* 29*  --   CREATININE 1.51* 1.03* 1.17* 1.18*  --   CALCIUM 8.9 7.3* 8.5* 8.5*  --    GFR: Estimated Creatinine Clearance: 44 mL/min (A) (by C-G formula based on SCr of 1.18 mg/dL (H)). Liver Function Tests: No results for input(s): AST, ALT, ALKPHOS, BILITOT, PROT, ALBUMIN in the last 168 hours. No results for input(s): LIPASE, AMYLASE in the last 168 hours. No results for input(s): AMMONIA in the last 168 hours. Coagulation Profile: Recent Labs  Lab 09/15/19 1118  INR 1.0   Cardiac Enzymes: No results for input(s): CKTOTAL, CKMB, CKMBINDEX, TROPONINI in the last 168 hours. BNP (last 3 results) No results for input(s): PROBNP in the last 8760 hours. HbA1C: No results for input(s): HGBA1C in the last 72 hours. CBG: Recent Labs  Lab 09/20/19 1643 09/20/19 2039 09/21/19 0009 09/21/19 0654 09/21/19 1122  GLUCAP 247* 129* 135* 143* 165*    Lipid Profile: No results for input(s): CHOL, HDL, LDLCALC, TRIG, CHOLHDL, LDLDIRECT in the last 72 hours. Thyroid Function Tests: No results for input(s): TSH, T4TOTAL, FREET4, T3FREE, THYROIDAB in the last 72 hours. Anemia Panel: No results for input(s): VITAMINB12, FOLATE, FERRITIN, TIBC, IRON, RETICCTPCT in the last 72 hours. Sepsis Labs: No results for input(s): PROCALCITON, LATICACIDVEN in the last 168 hours.  Recent Results (from the past 240 hour(s))  SARS CORONAVIRUS 2 (TAT 6-24 HRS) Nasopharyngeal Nasopharyngeal Swab     Status: None   Collection Time: 09/14/19  2:13 PM   Specimen: Nasopharyngeal Swab  Result Value Ref Range Status   SARS Coronavirus 2 NEGATIVE NEGATIVE Final    Comment: (NOTE) SARS-CoV-2 target nucleic acids are NOT DETECTED. The SARS-CoV-2 RNA is generally detectable in upper and lower respiratory specimens during the acute phase of infection. Negative results do not preclude SARS-CoV-2 infection, do not rule out co-infections with other pathogens, and should not be used as the sole basis for treatment or other patient management decisions. Negative results must be combined with clinical observations, patient history, and epidemiological information. The expected result is Negative. Fact Sheet for Patients: SugarRoll.be Fact Sheet for Healthcare Providers: https://www.woods-mathews.com/ This  test is not yet approved or cleared by the [place] and  has been authorized for detection and/or diagnosis of SARS-CoV-2 by FDA under an Emergency Use Authorization (EUA). This EUA will remain  in effect (meaning this test can be used) for the duration of the COVID-19 declaration under Section 56 4(b)(1) of the Act, 21 U.S.C. section 360bbb-3(b)(1), unless the authorization is terminated or revoked sooner. Performed at [facility], [place] [address] [place], [place] [address]   Surgical pcr screen      Status: None   Collection Time: 09/15/19 10:34 AM   Specimen: Nasal Mucosa; Nasal Swab  Result Value Ref Range Status   MRSA, PCR NEGATIVE NEGATIVE Final   Staphylococcus aureus NEGATIVE NEGATIVE Final    Comment: (NOTE) The Xpert SA Assay (FDA approved for NASAL specimens in patients 31 years of age and older), is one component of a comprehensive surveillance program. It is not intended to diagnose infection nor to guide or monitor treatment. Performed at [facility], [place] [address] [place], [place] [address]      Radiology Studies: No results found.  Scheduled Meds: . amLODipine  5 mg Oral Daily   And  . benazepril  40 mg Oral Daily  . cholecalciferol  2,000 Units Oral Daily  . feeding supplement (GLUCERNA SHAKE)  237 mL Oral BID BM  . glipiZIDE  5 mg Oral Daily  . hydrochlorothiazide  25 mg Oral Daily  . insulin aspart  0-15 Units Subcutaneous TID WC  . insulin aspart  0-5 Units Subcutaneous QHS  . insulin glargine  10 Units Subcutaneous Daily  . iron polysaccharides  150 mg Oral Daily  . loratadine  10 mg Oral Daily  . metoprolol succinate  50 mg Oral Daily  . pantoprazole  40 mg Oral QHS  . pioglitazone  45 mg Oral Daily  . pravastatin  40 mg Oral Daily  . prednisoLONE acetate  1 drop Left Eye Q6H   And  . prednisoLONE acetate  1 drop Right Eye BID  . senna  1 tablet Oral BID  . sodium chloride flush  3 mL Intravenous Q12H   Continuous Infusions: . sodium chloride       LOS: 7 days   Marylu Lund, MD [facility] Pager On Amion  If 7PM-7AM, please contact night-coverage 09/21/2019, 4:36 PM

## 2019-09-21 NOTE — Progress Notes (Signed)
Nutrition Follow-up  DOCUMENTATION CODES:   Obesity unspecified  INTERVENTION:   -Continue Glucerna Shake po BID, each supplement provides 220 kcal and 10 grams of protein -Magic cup TID with meals, each supplement provides 290 kcal and 9 grams of protein -MVI with minerals daily -Consider initiation of bowel regimen; noted last documented BM on 09/14/19  NUTRITION DIAGNOSIS:   Increased nutrient needs related to post-op healing as evidenced by estimated needs.  Ongoing  GOAL:   Patient will meet greater than or equal to 90% of their needs  Progressing   MONITOR:   PO intake, Supplement acceptance, Skin, Weight trends, Labs, I & O's, Diet advancement  REASON FOR ASSESSMENT:   Consult Poor PO  ASSESSMENT:   79 year old female with a history of diabetes, hypertension, hyperlipidemia, presents to the emergency room with worsening numbness and tingling in her hands and feet bilaterally.  MRI of the brain and cervical spine was recently performed showed cord compression due to severe spinal stenosis underwent surgical management on 3/18.  Procedure (3/18): Cervical five Corpectomy ANTERIOR CERVICAL DECOMPRESSION/DISCECTOMY FUSION CERVICAL THREE-CERVICAL Four    Spoke with pt and husband. She reports that her appetite is still fair to poor, and shares that she is "getting tired of the same old things". She would like to continue to with dysphagia 1 diet for comfort. She has been cosuming Glucerna supplements to assist with decreased oral intake. Per meal completion records, mea completion 75-100%.   Pt husband expressed of concern of lack of bowel movement (last documented on 09/14/19). Pt shares that she usually has a BM every 3-4 days. Pt denies offer of stool softener at this time, reports she has used these in the past and "they blow me out and I can't get it stop".   Plan to possible CIR admission once medically stable.   Labs reviewed: CBGS: 129-247 (inpatient orders for  glycemic control are 5 mg gllipizide daily, 45 mg pioglitazone daily, 0-15 units insulin aspart TID with meals, 0-5 units insulin aspart q HS, and 10 units insulin glargine daily).   Diet Order:   Diet Order            DIET - DYS 1 Room service appropriate? Yes; Fluid consistency: Thin  Diet effective now              EDUCATION NEEDS:   Not appropriate for education at this time  Skin:  Skin Assessment: Skin Integrity Issues: Skin Integrity Issues:: Incisions Incisions: neck  Last BM:  09/14/19  Height:   Ht Readings from Last 1 Encounters:  09/15/19 5' 2.01" (1.575 m)    Weight:   Wt Readings from Last 1 Encounters:  09/20/19 99.3 kg    Ideal Body Weight:  50 kg  BMI:  Body mass index is 40.03 kg/m.  Estimated Nutritional Needs:   Kcal:  1750-2000  Protein:  95-105 grams  Fluid:  >/= 1.8 L/day    Loistine Chance, RD, LDN, CDCES Registered Dietitian II Certified Diabetes Care and Education Specialist Please refer to [facility] for RD and/or RD on-call/weekend/after hours pager

## 2019-09-21 NOTE — Plan of Care (Signed)
  Problem: Education: Goal: Knowledge of General Education information will improve Description Including pain rating scale, medication(s)/side effects and non-pharmacologic comfort measures Outcome: Progressing   Problem: Nutrition: Goal: Adequate nutrition will be maintained Outcome: Progressing   Problem: Pain Managment: Goal: General experience of comfort will improve Outcome: Progressing   

## 2019-09-21 NOTE — Progress Notes (Addendum)
PT Cancellation Note  Patient Details Name: Doris Jackson MRN: [id number] DOB: [birth year]   Cancelled Treatment:    Reason Eval/Treat Not Completed: Patient declined, no reason specified Pt found OOB in recliner upon approach. Pt states she has been exhausted after getting a bath and toileting prior to arrival. Pt reports being too fatigued to work today. Educated patient on importance of mobility, pt continued to decline but asked for assistance donning cervical collar.   Pt continues to decline working with PT secondary to pain concerns, reports that she wants to work with [place] tomorrow. PT will continue following as time allows.   Ann Held PT, DPT Acute Rehab [facility] P: [phone]   Renato Gails 09/21/2019, 11:41 AM

## 2019-09-22 ENCOUNTER — Inpatient Hospital Stay (HOSPITAL_COMMUNITY)
Admission: RE | Admit: 2019-09-22 | Discharge: 2019-10-07 | DRG: 560 | Disposition: A | Discharge status: Home Health | Payer: Medicare HMO | Source: Intra-hospital | Attending: Physical Medicine and Rehabilitation | Admitting: Physical Medicine and Rehabilitation

## 2019-09-22 ENCOUNTER — Other Ambulatory Visit: Payer: Self-pay

## 2019-09-22 DIAGNOSIS — E1122 Type 2 diabetes mellitus with diabetic chronic kidney disease: Secondary | ICD-10-CM | POA: Diagnosis present

## 2019-09-22 DIAGNOSIS — M4722 Other spondylosis with radiculopathy, cervical region: Secondary | ICD-10-CM | POA: Diagnosis not present

## 2019-09-22 DIAGNOSIS — J3089 Other allergic rhinitis: Secondary | ICD-10-CM | POA: Diagnosis not present

## 2019-09-22 DIAGNOSIS — D62 Acute posthemorrhagic anemia: Secondary | ICD-10-CM | POA: Diagnosis not present

## 2019-09-22 DIAGNOSIS — K219 Gastro-esophageal reflux disease without esophagitis: Secondary | ICD-10-CM | POA: Diagnosis present

## 2019-09-22 DIAGNOSIS — E669 Obesity, unspecified: Secondary | ICD-10-CM | POA: Diagnosis not present

## 2019-09-22 DIAGNOSIS — R69 Illness, unspecified: Secondary | ICD-10-CM | POA: Diagnosis not present

## 2019-09-22 DIAGNOSIS — M4802 Spinal stenosis, cervical region: Secondary | ICD-10-CM | POA: Diagnosis not present

## 2019-09-22 DIAGNOSIS — N179 Acute kidney failure, unspecified: Secondary | ICD-10-CM | POA: Diagnosis not present

## 2019-09-22 DIAGNOSIS — Z6841 Body Mass Index (BMI) 40.0 and over, adult: Secondary | ICD-10-CM

## 2019-09-22 DIAGNOSIS — Z8249 Family history of ischemic heart disease and other diseases of the circulatory system: Secondary | ICD-10-CM

## 2019-09-22 DIAGNOSIS — N183 Chronic kidney disease, stage 3 unspecified: Secondary | ICD-10-CM | POA: Diagnosis present

## 2019-09-22 DIAGNOSIS — M4712 Other spondylosis with myelopathy, cervical region: Secondary | ICD-10-CM | POA: Diagnosis not present

## 2019-09-22 DIAGNOSIS — Z833 Family history of diabetes mellitus: Secondary | ICD-10-CM | POA: Diagnosis not present

## 2019-09-22 DIAGNOSIS — E1129 Type 2 diabetes mellitus with other diabetic kidney complication: Secondary | ICD-10-CM | POA: Diagnosis not present

## 2019-09-22 DIAGNOSIS — Z4789 Encounter for other orthopedic aftercare: Secondary | ICD-10-CM | POA: Diagnosis not present

## 2019-09-22 DIAGNOSIS — D72829 Elevated white blood cell count, unspecified: Secondary | ICD-10-CM | POA: Diagnosis present

## 2019-09-22 DIAGNOSIS — Z981 Arthrodesis status: Secondary | ICD-10-CM | POA: Diagnosis not present

## 2019-09-22 DIAGNOSIS — I129 Hypertensive chronic kidney disease with stage 1 through stage 4 chronic kidney disease, or unspecified chronic kidney disease: Secondary | ICD-10-CM | POA: Diagnosis present

## 2019-09-22 DIAGNOSIS — Z825 Family history of asthma and other chronic lower respiratory diseases: Secondary | ICD-10-CM | POA: Diagnosis not present

## 2019-09-22 DIAGNOSIS — M1990 Unspecified osteoarthritis, unspecified site: Secondary | ICD-10-CM | POA: Diagnosis present

## 2019-09-22 DIAGNOSIS — J301 Allergic rhinitis due to pollen: Secondary | ICD-10-CM | POA: Diagnosis not present

## 2019-09-22 DIAGNOSIS — Z90710 Acquired absence of both cervix and uterus: Secondary | ICD-10-CM

## 2019-09-22 DIAGNOSIS — G8918 Other acute postprocedural pain: Secondary | ICD-10-CM | POA: Diagnosis not present

## 2019-09-22 DIAGNOSIS — I10 Essential (primary) hypertension: Secondary | ICD-10-CM | POA: Diagnosis not present

## 2019-09-22 DIAGNOSIS — M8580 Other specified disorders of bone density and structure, unspecified site: Secondary | ICD-10-CM | POA: Diagnosis present

## 2019-09-22 DIAGNOSIS — E785 Hyperlipidemia, unspecified: Secondary | ICD-10-CM | POA: Diagnosis present

## 2019-09-22 DIAGNOSIS — R7309 Other abnormal glucose: Secondary | ICD-10-CM | POA: Diagnosis not present

## 2019-09-22 DIAGNOSIS — Z87898 Personal history of other specified conditions: Secondary | ICD-10-CM

## 2019-09-22 DIAGNOSIS — K5900 Constipation, unspecified: Secondary | ICD-10-CM | POA: Diagnosis not present

## 2019-09-22 DIAGNOSIS — Z853 Personal history of malignant neoplasm of breast: Secondary | ICD-10-CM

## 2019-09-22 DIAGNOSIS — R1310 Dysphagia, unspecified: Secondary | ICD-10-CM | POA: Diagnosis not present

## 2019-09-22 DIAGNOSIS — D696 Thrombocytopenia, unspecified: Secondary | ICD-10-CM | POA: Diagnosis present

## 2019-09-22 DIAGNOSIS — F411 Generalized anxiety disorder: Secondary | ICD-10-CM | POA: Diagnosis not present

## 2019-09-22 DIAGNOSIS — R202 Paresthesia of skin: Secondary | ICD-10-CM | POA: Diagnosis present

## 2019-09-22 DIAGNOSIS — M25512 Pain in left shoulder: Secondary | ICD-10-CM | POA: Diagnosis not present

## 2019-09-22 DIAGNOSIS — Z5329 Procedure and treatment not carried out because of patient's decision for other reasons: Secondary | ICD-10-CM | POA: Diagnosis not present

## 2019-09-22 DIAGNOSIS — E1140 Type 2 diabetes mellitus with diabetic neuropathy, unspecified: Secondary | ICD-10-CM | POA: Diagnosis present

## 2019-09-22 DIAGNOSIS — F419 Anxiety disorder, unspecified: Secondary | ICD-10-CM | POA: Diagnosis present

## 2019-09-22 DIAGNOSIS — J449 Chronic obstructive pulmonary disease, unspecified: Secondary | ICD-10-CM | POA: Diagnosis present

## 2019-09-22 DIAGNOSIS — G959 Disease of spinal cord, unspecified: Secondary | ICD-10-CM | POA: Diagnosis not present

## 2019-09-22 DIAGNOSIS — E1169 Type 2 diabetes mellitus with other specified complication: Secondary | ICD-10-CM | POA: Diagnosis not present

## 2019-09-22 LAB — GLUCOSE, CAPILLARY
Glucose-Capillary: 140 mg/dL — ABNORMAL HIGH (ref 70–99)
Glucose-Capillary: 202 mg/dL — ABNORMAL HIGH (ref 70–99)
Glucose-Capillary: 127 mg/dL — ABNORMAL HIGH (ref 70–99)
Glucose-Capillary: 125 mg/dL — ABNORMAL HIGH (ref 70–99)

## 2019-09-22 MED ORDER — OXYCODONE HCL 5 MG PO TABS
5.0000 mg | ORAL_TABLET | ORAL | Status: DC | PRN
  Administered 2019-09-22 (×2): 10 mg via ORAL
  Filled 2019-09-22: qty 2
  Filled 2019-09-22: qty 1
  Filled 2019-09-22 (×2): qty 2

## 2019-09-22 MED ORDER — PHENOL 1.4 % MT LIQD: 1.0000 | OROMUCOSAL | Status: DC | PRN

## 2019-09-22 MED ORDER — FLEET ENEMA 7-19 GM/118ML RE ENEM: 1.0000 | ENEMA | Freq: Once | RECTAL | Status: DC | PRN

## 2019-09-22 MED ORDER — DIPHENHYDRAMINE HCL 12.5 MG/5ML PO ELIX: 12.5000 mg | ORAL_SOLUTION | Freq: Four times a day (QID) | ORAL | Status: DC | PRN

## 2019-09-22 MED ORDER — INSULIN ASPART 100 UNIT/ML ~~LOC~~ SOLN
0.0000 [IU] | Freq: Three times a day (TID) | SUBCUTANEOUS | Status: DC
  Administered 2019-09-22: 2 [IU] via SUBCUTANEOUS
  Administered 2019-09-23: 3 [IU] via SUBCUTANEOUS
  Administered 2019-09-23: 2 [IU] via SUBCUTANEOUS
  Administered 2019-09-24 – 2019-09-25 (×3): 3 [IU] via SUBCUTANEOUS
  Administered 2019-09-25 – 2019-09-27 (×5): 2 [IU] via SUBCUTANEOUS
  Administered 2019-09-27: 3 [IU] via SUBCUTANEOUS
  Administered 2019-09-28 – 2019-09-29 (×3): 2 [IU] via SUBCUTANEOUS
  Administered 2019-09-30: 13:00:00 3 [IU] via SUBCUTANEOUS
  Administered 2019-09-30: 18:00:00 5 [IU] via SUBCUTANEOUS
  Administered 2019-10-01: 3 [IU] via SUBCUTANEOUS
  Administered 2019-10-01 (×2): 2 [IU] via SUBCUTANEOUS
  Administered 2019-10-02 (×2): 3 [IU] via SUBCUTANEOUS
  Administered 2019-10-02: 2 [IU] via SUBCUTANEOUS
  Administered 2019-10-03: 18:00:00 3 [IU] via SUBCUTANEOUS
  Administered 2019-10-03 (×2): 2 [IU] via SUBCUTANEOUS
  Administered 2019-10-04: 09:00:00 3 [IU] via SUBCUTANEOUS
  Administered 2019-10-04 – 2019-10-06 (×4): 2 [IU] via SUBCUTANEOUS
  Administered 2019-10-06: 3 [IU] via SUBCUTANEOUS
  Administered 2019-10-07: 08:00:00 2 [IU] via SUBCUTANEOUS

## 2019-09-22 MED ORDER — PROCHLORPERAZINE 25 MG RE SUPP: 12.5000 mg | Freq: Four times a day (QID) | RECTAL | Status: DC | PRN

## 2019-09-22 MED ORDER — CYCLOBENZAPRINE HCL 10 MG PO TABS
10.0000 mg | ORAL_TABLET | Freq: Three times a day (TID) | ORAL | Status: DC | PRN
  Filled 2019-09-22 (×2): qty 1

## 2019-09-22 MED ORDER — INSULIN GLARGINE 100 UNIT/ML ~~LOC~~ SOLN: 10.0000 [IU] | Freq: Every day | SUBCUTANEOUS | 11 refills | Status: DC

## 2019-09-22 MED ORDER — PREDNISOLONE ACETATE 1 % OP SUSP
1.0000 [drp] | Freq: Four times a day (QID) | OPHTHALMIC | Status: DC
  Administered 2019-09-22 – 2019-10-07 (×58): 1 [drp] via OPHTHALMIC
  Filled 2019-09-22: qty 5

## 2019-09-22 MED ORDER — POLYSACCHARIDE IRON COMPLEX 150 MG PO CAPS
150.0000 mg | ORAL_CAPSULE | Freq: Every day | ORAL | Status: DC
  Administered 2019-09-23 – 2019-10-07 (×15): 150 mg via ORAL
  Filled 2019-09-22 (×15): qty 1

## 2019-09-22 MED ORDER — TRAZODONE HCL 50 MG PO TABS: 25.0000 mg | ORAL_TABLET | Freq: Every evening | ORAL | Status: DC | PRN

## 2019-09-22 MED ORDER — LORATADINE 10 MG PO TABS
10.0000 mg | ORAL_TABLET | Freq: Every day | ORAL | Status: DC
  Administered 2019-09-23 – 2019-10-07 (×15): 10 mg via ORAL
  Filled 2019-09-22 (×15): qty 1

## 2019-09-22 MED ORDER — METOPROLOL SUCCINATE ER 50 MG PO TB24: 50.0000 mg | ORAL_TABLET | Freq: Every day | ORAL | Status: DC

## 2019-09-22 MED ORDER — SENNOSIDES-DOCUSATE SODIUM 8.6-50 MG PO TABS
2.0000 | ORAL_TABLET | Freq: Every day | ORAL | Status: DC
  Administered 2019-09-25 – 2019-09-29 (×2): 2 via ORAL
  Filled 2019-09-22 (×8): qty 2

## 2019-09-22 MED ORDER — METOPROLOL SUCCINATE ER 50 MG PO TB24
50.0000 mg | ORAL_TABLET | Freq: Every day | ORAL | Status: DC
  Administered 2019-09-23 – 2019-10-07 (×14): 50 mg via ORAL
  Filled 2019-09-22 (×15): qty 1

## 2019-09-22 MED ORDER — LORAZEPAM 0.5 MG PO TABS: 0.5000 mg | ORAL_TABLET | Freq: Four times a day (QID) | ORAL | Status: DC | PRN

## 2019-09-22 MED ORDER — POLYETHYLENE GLYCOL 3350 17 G PO PACK
17.0000 g | PACK | Freq: Every day | ORAL | Status: DC | PRN
  Administered 2019-09-26: 17 g via ORAL
  Filled 2019-09-22 (×2): qty 1

## 2019-09-22 MED ORDER — AZELASTINE HCL 0.1 % NA SOLN: 1.0000 | Freq: Every day | NASAL | Status: DC | PRN

## 2019-09-22 MED ORDER — PIOGLITAZONE HCL 45 MG PO TABS
45.0000 mg | ORAL_TABLET | Freq: Every day | ORAL | Status: DC
  Administered 2019-09-23 – 2019-10-07 (×15): 45 mg via ORAL
  Filled 2019-09-22 (×15): qty 1

## 2019-09-22 MED ORDER — FLUTICASONE PROPIONATE 50 MCG/ACT NA SUSP: 1.0000 | Freq: Every day | NASAL | Status: DC | PRN

## 2019-09-22 MED ORDER — LORAZEPAM 0.5 MG PO TABS: 0.5000 mg | ORAL_TABLET | Freq: Four times a day (QID) | ORAL | 0 refills | Status: DC | PRN

## 2019-09-22 MED ORDER — GLIPIZIDE ER 5 MG PO TB24
5.0000 mg | ORAL_TABLET | Freq: Every day | ORAL | Status: DC
  Administered 2019-09-23 – 2019-10-07 (×15): 5 mg via ORAL
  Filled 2019-09-22 (×15): qty 1

## 2019-09-22 MED ORDER — AMLODIPINE BESYLATE 5 MG PO TABS
5.0000 mg | ORAL_TABLET | Freq: Every day | ORAL | Status: DC
  Administered 2019-09-23 – 2019-09-29 (×6): 5 mg via ORAL
  Filled 2019-09-22 (×7): qty 1

## 2019-09-22 MED ORDER — LINAGLIPTIN 5 MG PO TABS
5.0000 mg | ORAL_TABLET | Freq: Every day | ORAL | Status: DC
  Administered 2019-09-23 – 2019-10-07 (×15): 5 mg via ORAL
  Filled 2019-09-22 (×15): qty 1

## 2019-09-22 MED ORDER — PANTOPRAZOLE SODIUM 40 MG PO TBEC
40.0000 mg | DELAYED_RELEASE_TABLET | Freq: Every day | ORAL | Status: DC
  Administered 2019-09-22 – 2019-10-06 (×15): 40 mg via ORAL
  Filled 2019-09-22 (×15): qty 1

## 2019-09-22 MED ORDER — ACETAMINOPHEN 325 MG PO TABS
325.0000 mg | ORAL_TABLET | ORAL | Status: DC | PRN
  Administered 2019-09-23 – 2019-09-28 (×8): 650 mg via ORAL
  Administered 2019-09-29 (×2): 325 mg via ORAL
  Administered 2019-09-30 – 2019-10-06 (×5): 650 mg via ORAL
  Filled 2019-09-22 (×7): qty 2
  Filled 2019-09-22: qty 1
  Filled 2019-09-22: qty 2
  Filled 2019-09-22: qty 1
  Filled 2019-09-22 (×6): qty 2

## 2019-09-22 MED ORDER — HYDROCODONE-ACETAMINOPHEN 5-325 MG PO TABS: 1.0000 | ORAL_TABLET | ORAL | 0 refills | Status: DC | PRN

## 2019-09-22 MED ORDER — PROCHLORPERAZINE MALEATE 5 MG PO TABS: 5.0000 mg | ORAL_TABLET | Freq: Four times a day (QID) | ORAL | Status: DC | PRN

## 2019-09-22 MED ORDER — INSULIN ASPART 100 UNIT/ML ~~LOC~~ SOLN: 0.0000 [IU] | Freq: Every day | SUBCUTANEOUS | Status: DC

## 2019-09-22 MED ORDER — PREDNISOLONE ACETATE 1 % OP SUSP
1.0000 [drp] | Freq: Two times a day (BID) | OPHTHALMIC | Status: DC
  Administered 2019-09-22 – 2019-10-07 (×30): 1 [drp] via OPHTHALMIC

## 2019-09-22 MED ORDER — PRAVASTATIN SODIUM 40 MG PO TABS
40.0000 mg | ORAL_TABLET | Freq: Every day | ORAL | Status: DC
  Administered 2019-09-23 – 2019-10-07 (×14): 40 mg via ORAL
  Filled 2019-09-22 (×15): qty 1

## 2019-09-22 MED ORDER — VITAMIN D 25 MCG (1000 UNIT) PO TABS
2000.0000 [IU] | ORAL_TABLET | Freq: Every day | ORAL | Status: DC
  Administered 2019-09-23 – 2019-10-07 (×15): 2000 [IU] via ORAL
  Filled 2019-09-22 (×15): qty 2

## 2019-09-22 MED ORDER — ENSURE MAX PROTEIN PO LIQD
11.0000 [oz_av] | Freq: Two times a day (BID) | ORAL | Status: DC
  Administered 2019-09-23: 09:00:00 11 [oz_av] via ORAL
  Filled 2019-09-22 (×3): qty 330

## 2019-09-22 MED ORDER — GUAIFENESIN-DM 100-10 MG/5ML PO SYRP: 5.0000 mL | ORAL_SOLUTION | Freq: Four times a day (QID) | ORAL | Status: DC | PRN

## 2019-09-22 MED ORDER — MENTHOL 3 MG MT LOZG
1.0000 | LOZENGE | OROMUCOSAL | Status: DC | PRN
  Filled 2019-09-22: qty 9

## 2019-09-22 MED ORDER — ZOLPIDEM TARTRATE 5 MG PO TABS: 5.0000 mg | ORAL_TABLET | Freq: Every evening | ORAL | 0 refills | Status: DC | PRN

## 2019-09-22 MED ORDER — INSULIN ASPART 100 UNIT/ML ~~LOC~~ SOLN
4.0000 [IU] | Freq: Three times a day (TID) | SUBCUTANEOUS | Status: DC
  Administered 2019-09-22 – 2019-10-07 (×39): 4 [IU] via SUBCUTANEOUS

## 2019-09-22 MED ORDER — PROCHLORPERAZINE EDISYLATE 10 MG/2ML IJ SOLN: 5.0000 mg | Freq: Four times a day (QID) | INTRAMUSCULAR | Status: DC | PRN

## 2019-09-22 MED ORDER — BISACODYL 10 MG RE SUPP
10.0000 mg | Freq: Every day | RECTAL | Status: DC | PRN
  Filled 2019-09-22 (×2): qty 1

## 2019-09-22 MED ORDER — HYDROCHLOROTHIAZIDE 25 MG PO TABS
25.0000 mg | ORAL_TABLET | Freq: Every day | ORAL | Status: DC
  Administered 2019-09-23 – 2019-09-26 (×4): 25 mg via ORAL
  Filled 2019-09-22 (×4): qty 1

## 2019-09-22 MED ORDER — BENAZEPRIL HCL 20 MG PO TABS
40.0000 mg | ORAL_TABLET | Freq: Every day | ORAL | Status: DC
  Administered 2019-09-23 – 2019-09-29 (×7): 40 mg via ORAL
  Filled 2019-09-22 (×7): qty 2

## 2019-09-22 MED ORDER — ALUM & MAG HYDROXIDE-SIMETH 200-200-20 MG/5ML PO SUSP: 30.0000 mL | ORAL | Status: DC | PRN

## 2019-09-22 MED ORDER — INSULIN GLARGINE 100 UNIT/ML ~~LOC~~ SOLN
5.0000 [IU] | Freq: Every day | SUBCUTANEOUS | Status: AC
  Administered 2019-09-23: 09:00:00 5 [IU] via SUBCUTANEOUS
  Filled 2019-09-22: qty 0.05

## 2019-09-22 NOTE — Progress Notes (Signed)
Courtney Heys, MD  Physician  Physical Medicine and Rehabilitation  PMR Pre-admission  Signed  Date of Service:  09/19/2019  2:47 PM      Related encounter: ED to Hosp-Admission (Current) from 09/14/2019 in [place]        Show:Clear all '[x]' Manual'[x]' Template'[x]' Copied  Added by: '[x]' Cristina Gong, RN'[x]' Bethel Born, CCC-SLP'[x]' Courtney Heys, MD  '[]' Hover for details PMR Admission Coordinator Pre-Admission Assessment   Patient: Doris Jackson is an 79 y.o., female MRN: [id number] DOB: Jul 27, 1940 Height: 5' 2.01" (157.5 cm) Weight: 99.3 kg   Insurance Information HMO:     PPO: yes     PCP:      IPA:      80/20:     OTHER:  PRIMARY: [employer] Medicare     Policy#: [id number]     Subscriber: patient CM Name:   General CM   Phone#:  [phone]    Fax#: [phone] Pre-Cert#: [id number] approved for 7 days with f/u with [facility] phone [phone] fax [phone]   Employer: Benefits: [phone]   Eff. Date: 06/30/2016-06/29/2020     Deduct: NA      Out of Pocket Max: $$5,000 ($403.40 met)     Life Max:  CIR: $295/day co-pay per days 1-6    SNF: $0 co-pay days 1-20, $184/day co-pay for days 21-100. Limited to 100 days Outpatient: $35/visit co-pay     Co-Pay: visits limited by medical necessity Home Health: 100% coverage, 0% co-insurance. Limited by medical necessity      Co-Pay:  DME: 80% coverage; 20% co-insurance     Co-Pay:  Providers: in network  SECONDARY:  NA              Medicaid Application Date:  NA     Case Manager:  Disability Application Date: NA      Case Worker:    The "Data Collection Information Summary" for patients in Inpatient Rehabilitation Facilities with attached "Privacy Act [place] Records" was provided and verbally reviewed with: Patient and Family   Emergency Contact Information         Contact Information     Name Relation Home Work Mobile    Lapage,Dennis Spouse  [phone]   [phone]         Current Medical History  Patient Admitting Diagnosis: cervical myelopathy   History of Present Illness: Pt is a 79 year old female with past medical hx significant for HTN, hyperlipidemia, DM, left eye corneal transplant, microcytic anemia, seasonal allergies.  Pt presented to ED on 09/14/19 d/t hand a foot pain. Pt was having more trouble walking and having trouble using her hands.  Pt was seen in ER 4 days prior and diagnosed with cervical myelopathy. Pt refused admission at that time to discuss issue with husband.  Pt admitted on 3/17 with plan for surgery in next 2 days.  Pt had anterior cervical decompression C3/4 on 09/15/19   Completed course of Decadron. Tolerating pureed diet. Creatinine 1.78 on admit with baseline 1.2, likely secondary to prerenal azotemia form poor oral intake pta. Received IV fluid hydration with improvement of creat to 1.18. Plans to avoid nephrotoxins and renal dose all medications.   On Januvia 100 mg p.o. daily, Actos 45 mg p.o. daily, Metformin 500 mg p.o. twice daily, glipizide 5 mg p.o. daily at home.  Hemoglobin A1c 7.0 on 08/16/2019, well controlled. --Glipizide and Actos --Lantus 10 unit subcutaneously daily --Insulin sliding scale for  further coverage --glucose trends reviewed. For the most part, controlled with transient value in the 200's noted. Will cont to follow   Patient's medical record from [facility] has been reviewed by the rehabilitation admission coordinator and physician.   Past Medical History      Past Medical History:  Diagnosis Date  . ALLERGIC RHINITIS 06/17/2010  . ANEMIA-NOS 06/17/2010  . BREAST CANCER, HX OF 06/17/2010  . Cancer [facility]) 2008    right breast  . Cataract    . COPD 06/17/2010  . DIABETES MELLITUS, TYPE II 06/17/2010  . Endometriosis    . Fibroid      FIBROID  . HYPERLIPIDEMIA 06/17/2010  . HYPERTENSION 06/17/2010  . Leg swelling    . LOW BACK PAIN 06/17/2010  .  OSTEOARTHRITIS 06/17/2010  . OSTEOPENIA 06/17/2010  . Weakness        Family History   family history includes Asthma in her father; Diabetes in her brother and mother; Hypertension in her sister.   Prior Rehab/Hospitalizations Has the patient had prior rehab or hospitalizations prior to admission? Yes   Has the patient had major surgery during 100 days prior to admission? Yes              Current Medications   Current Facility-Administered Medications:  .  0.9 %  sodium chloride infusion, 250 mL, Intravenous, Continuous, Cabbell, Kyle, MD .  acetaminophen (TYLENOL) tablet 650 mg, 650 mg, Oral, Q6H PRN **OR** acetaminophen (TYLENOL) suppository 650 mg, 650 mg, Rectal, Q6H PRN, Ashok Pall, MD .  amLODipine (NORVASC) tablet 5 mg, 5 mg, Oral, Daily, 5 mg at 09/22/19 0946 **AND** benazepril (LOTENSIN) tablet 40 mg, 40 mg, Oral, Daily, Ashok Pall, MD, 40 mg at 09/22/19 0944 .  azelastine (ASTELIN) 0.1 % nasal spray 1 spray, 1 spray, Each Nare, Daily PRN **AND** fluticasone (FLONASE) 50 MCG/ACT nasal spray 1 spray, 1 spray, Each Nare, Daily PRN, Ashok Pall, MD .  bisacodyl (DULCOLAX) EC tablet 5 mg, 5 mg, Oral, Daily PRN, Ashok Pall, MD .  cholecalciferol (VITAMIN D3) tablet 2,000 Units, 2,000 Units, Oral, Daily, [place] Eric J, DO, 2,000 Units at 09/22/19 0945 .  cyclobenzaprine (FLEXERIL) tablet 10 mg, 10 mg, Oral, TID PRN, Ashok Pall, MD, 10 mg at 09/18/19 2104 .  feeding supplement (GLUCERNA SHAKE) (GLUCERNA SHAKE) liquid 237 mL, 237 mL, Oral, BID BM, [place] Eric J, DO, 237 mL at 09/22/19 1029 .  glipiZIDE (GLUCOTROL XL) 24 hr tablet 5 mg, 5 mg, Oral, Daily, Ashok Pall, MD, 5 mg at 09/22/19 1028 .  hydrochlorothiazide (HYDRODIURIL) tablet 25 mg, 25 mg, Oral, Daily, Ashok Pall, MD, 25 mg at 09/22/19 0944 .  HYDROcodone-acetaminophen (NORCO/VICODIN) 5-325 MG per tablet 1 tablet, 1 tablet, Oral, Q4H PRN, Ashok Pall, MD, 1 tablet at 09/22/19 1025 .  insulin aspart  (novoLOG) injection 0-15 Units, 0-15 Units, Subcutaneous, TID WC, Ashok Pall, MD, 2 Units at 09/22/19 0920 .  insulin aspart (novoLOG) injection 0-5 Units, 0-5 Units, Subcutaneous, QHS, Ashok Pall, MD, 3 Units at 09/21/19 2216 .  insulin glargine (LANTUS) injection 10 Units, 10 Units, Subcutaneous, Daily, [place] Eric J, DO, 10 Units at 09/22/19 1028 .  iron polysaccharides (NIFEREX) capsule 150 mg, 150 mg, Oral, Daily, Ashok Pall, MD, 150 mg at 09/22/19 0945 .  loratadine (CLARITIN) tablet 10 mg, 10 mg, Oral, Daily, Ashok Pall, MD, 10 mg at 09/22/19 0944 .  LORazepam (ATIVAN) tablet 0.5 mg, 0.5 mg, Oral, Q6H PRN, [place] Donnamarie Poag, DO .  magnesium citrate  solution 1 Bottle, 1 Bottle, Oral, Once PRN, Ashok Pall, MD .  menthol-cetylpyridinium (CEPACOL) lozenge 3 mg, 1 lozenge, Oral, PRN **OR** phenol (CHLORASEPTIC) mouth spray 1 spray, 1 spray, Mouth/Throat, PRN, Ashok Pall, MD .  metoprolol succinate (TOPROL-XL) 24 hr tablet 50 mg, 50 mg, Oral, Daily, Ashok Pall, MD, 50 mg at 09/22/19 1025 .  ondansetron (ZOFRAN) tablet 4 mg, 4 mg, Oral, Q6H PRN **OR** ondansetron (ZOFRAN) injection 4 mg, 4 mg, Intravenous, Q6H PRN, Ashok Pall, MD .  pantoprazole (PROTONIX) EC tablet 40 mg, 40 mg, Oral, QHS, Ashok Pall, MD, 40 mg at 09/21/19 2056 .  pioglitazone (ACTOS) tablet 45 mg, 45 mg, Oral, Daily, Ashok Pall, MD, 45 mg at 09/22/19 1026 .  pravastatin (PRAVACHOL) tablet 40 mg, 40 mg, Oral, Daily, Ashok Pall, MD, 40 mg at 09/22/19 0945 .  prednisoLONE acetate (PRED FORTE) 1 % ophthalmic suspension 1 drop, 1 drop, Left Eye, Q6H, 1 drop at 09/22/19 0947 **AND** prednisoLONE acetate (PRED FORTE) 1 % ophthalmic suspension 1 drop, 1 drop, Right Eye, BID, Ashok Pall, MD, 1 drop at 09/22/19 0948 .  senna (SENOKOT) tablet 8.6 mg, 1 tablet, Oral, BID, Ashok Pall, MD, 8.6 mg at 09/22/19 0945 .  senna-docusate (Senokot-S) tablet 1 tablet, 1 tablet, Oral, QHS PRN, Ashok Pall, MD .   sodium chloride flush (NS) 0.9 % injection 3 mL, 3 mL, Intravenous, Q12H, Ashok Pall, MD, 3 mL at 09/22/19 0948 .  sodium chloride flush (NS) 0.9 % injection 3 mL, 3 mL, Intravenous, PRN, Ashok Pall, MD, 3 mL at 09/20/19 0948 .  zolpidem (AMBIEN) tablet 5 mg, 5 mg, Oral, QHS PRN, Ashok Pall, MD   Patients Current Diet:     Diet Order                      DIET - DYS 1 Room service appropriate? Yes; Fluid consistency: Thin  Diet effective now                   Precautions / Restrictions Precautions Precautions: Cervical, Fall Precaution Booklet Issued: Yes (comment) Precaution Comments: reviewd cervial precautions with pt and husband with handout provided Cervical Brace: Hard collar Restrictions Weight Bearing Restrictions: No    Has the patient had 2 or more falls or a fall with injury in the past year? Yes. Pt stated she had a tendency to fall prior to current health issues.   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 Independent   Indoor Mobility: Did the patient need assistance with walking from room to room (with or without device)? Needed some help   Stairs: Did the patient need assistance with internal or external stairs (with or without device)? Independent   Functional Cognition: Did the patient need help planning regular tasks such as shopping or remembering to take medications? [place] Devices/Equipment: Cane (specify quad or straight), Walker (specify type), Grab bars in shower Home Equipment: Walker - 4 wheels   Prior Device Use: Indicate devices/aids used by the patient prior to current illness, exacerbation or injury? Walker   Current Functional Level Cognition   Overall Cognitive Status: Within Functional Limits  for tasks assessed Orientation Level: Oriented X4 General Comments: Pt continues to report anxiety with mobility, but is progressing well.  Extremity Assessment (includes Sensation/Coordination)   Upper Extremity Assessment: RUE deficits/detail, LUE deficits/detail RUE Deficits / Details: grip and UE strength impaired grossly 2+/5 - 3-/5;pt reports numbness and tingling;compensates with use of RUE  LUE Deficits / Details: pt with significant weakness BUE including numbness and tingling in both hands. Reports dropping items often;demonstrates difficutly using utensils;L hand dominant LUE Sensation: decreased light touch, decreased proprioception LUE Coordination: decreased fine motor, decreased gross motor  Lower Extremity Assessment: Defer to PT evaluation RLE Deficits / Details: ROM: WNL globally. Strength 4/5  (hip flex, hip abd/add. knee flex/ext, DF) RLE Sensation: decreased light touch RLE Coordination: WNL LLE Deficits / Details: ROM: WNL globally. Strength 3+/5  (hip flex, hip abd/add. knee flex/ext, DF) LLE Sensation: decreased light touch LLE Coordination: WNL     ADLs   Overall ADL's : Needs assistance/impaired Eating/Feeding: Set up, Sitting, With adaptive utensils Eating/Feeding Details (indicate cue type and reason): provided pt with red tubing for built up utensil to promote independence with self feeding using dominant hand Grooming: Wash/dry hands, Wash/dry face, Minimal assistance, Standing Upper Body Bathing: Minimal assistance, Sitting Lower Body Bathing: Maximal assistance, Sit to/from stand, Cueing for safety, Cueing for compensatory techniques, Cueing for sequencing Upper Body Dressing : Minimal assistance, Sitting Lower Body Dressing: Moderate assistance, With adaptive equipment, Cueing for sequencing, Cueing for compensatory techniques Lower Body Dressing Details (indicate cue type and reason): pt able to return demonstrate use of AE (sock aid and reacher to  don/doff socks) with modA for sequencing  Toilet Transfer: Minimal assistance, Ambulation, RW, Stand-pivot, Cueing for safety, Cueing for sequencing Toileting- Clothing Manipulation and Hygiene: Maximal assistance, Sit to/from stand, Cueing for compensatory techniques, Cueing for safety, Cueing for sequencing Functional mobility during ADLs: Minimal assistance, Rolling walker, Cueing for safety, Cueing for sequencing General ADL Comments: session focused on feeding and AE to assist with LB dressing     Mobility   Overal bed mobility: Needs Assistance Bed Mobility: Sit to Supine Supine to sit: Min assist Sit to supine: Mod assist General bed mobility comments: pt sitting in recliner upon arrival     Transfers   Overall transfer level: Needs assistance Equipment used: Rolling walker (2 wheeled) Transfers: Sit to/from Stand Sit to Stand: Mod assist, Min guard Stand pivot transfers: Min assist General transfer comment: deferred pt requesting to eat lunch     Ambulation / Gait / Stairs / Wheelchair Mobility   Ambulation/Gait Ambulation/Gait assistance: Counsellor (Feet): 15 Feet(x3) Assistive device: Rolling walker (2 wheeled) Gait Pattern/deviations: Step-through pattern, Decreased stride length, Trunk flexed, Wide base of support General Gait Details: Pt ambulates slowly with reduced foot clearance and stride length. She requires intermittent cues for upright posture. Gait velocity: decreased Gait velocity interpretation: <1.31 ft/sec, indicative of household ambulator     Posture / Balance Dynamic Sitting Balance Sitting balance - Comments: supervision Balance Overall balance assessment: Needs assistance Sitting-balance support: Single extremity supported, Feet supported Sitting balance-Leahy Scale: Good Sitting balance - Comments: supervision Standing balance support: Bilateral upper extremity supported, During functional activity Standing balance-Leahy Scale:  Poor Standing balance comment: reliant on BUE support     Special needs/care consideration BiPAP/CPAP:NA CPM:NA Continuous Drip IV:NA  Dialysis::NA         Life Vest :NA Oxygen:NA  Special Bed:NA  Trach Size:NA Wound Vac (area):NA      Skin NA ; surgical incision Bowel mgmt: continent Bladder mgmt: continent Diabetic mgmt: yes Behavioral consideration:NA  Chemo/radiation:NA Designated visitor is spouse, Simona Huh  Previous Home Environment  Living Arrangements: Spouse/significant other  Lives With: Spouse Available Help at Discharge: Family Type of Home: House Home Layout: One level Home Access: Stairs to enter Entrance Stairs-Rails: Can reach both Entrance Stairs-Number of Steps: 1 step Bathroom Shower/Tub: Chiropodist: Standard Bathroom Accessibility: Yes How Accessible: Accessible via walker [place]: No Additional Comments: Was using rollator prior to admission   Discharge Living Setting Plans for Discharge Living Setting: Patient's home Type of Home at Discharge: House Discharge Home Layout: One level Discharge Home Access: Stairs to enter Entrance Stairs-Rails: Can reach both Entrance Stairs-Number of Steps: 1 step Discharge Bathroom Shower/Tub: Tub/shower unit Discharge Bathroom Toilet: Standard Discharge Bathroom Accessibility: Yes How Accessible: Accessible via walker Does the patient have any problems obtaining your medications?: No   Social/Family/Support Systems Patient Roles: Spouse, Parent(Pt has a daughter with a disability whom she helps with meds) Anticipated Caregiver: Lida Berkery, husband Anticipated Caregiver's Contact Information: [phone] Ability/Limitations of Caregiver: 79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 Discharge Plan Discussed with Primary Caregiver: Yes Is Caregiver In Agreement with Plan?: Yes Does Caregiver/Family have Issues with Lodging/Transportation while  Pt is in Rehab?: No   Goals/Additional Needs Patient/Family Goal for Rehab: supervision PT, supervision-mod I OT Expected length of stay: 7-10 days Pt/Family Agrees to Admission and willing to participate: Yes Program Orientation Provided & Reviewed with Pt/Caregiver Including Roles  & Responsibilities: Yes   Decrease burden of Care through IP rehab admission: NA   Possible need for SNF placement upon discharge: no    Patient Condition: I have reviewed medical records from [facility], spoken with CM, patient and husband. I met with patient at bedside for inpatient rehabilitation assessment.  Patient will benefit from ongoing PT and OT, can actively participate in 3 hours of therapy a day 5 days of the week, and can make measurable gains during the admission.  Patient will also benefit from the coordinated team approach during an Inpatient Acute Rehabilitation admission.  The patient will receive intensive therapy as well as Rehabilitation physician, nursing, social worker, and care management interventions.  Due to safety, disease management, medication administration and patient education the patient requires 24 hour a day rehabilitation nursing.  The patient is currently Min A with mobility and Mod A- Max A  upper body basic ADLs.  Discharge setting and therapy post discharge at home with home health is anticipated.  Patient has agreed to participate in the Acute Inpatient Rehabilitation Program and will admit today.   Preadmission Screen Completed By: Completed by Gayland Curry, MS, CCC-SLP with brief updates by  Annamary Rummage MSN 09/22/2019 11:09 AM ______________________________________________________________________   Discussed status with Dr. Dagoberto Ligas on  09/22/2019 at  27 and received approval for admission today.   Admission Coordinator:  Cleatrice Burke, RN, MSN time  6226 Date  09/22/2019    Assessment/Plan: Diagnosis: 1. Does the need for close, 24  hr/day Medical supervision in concert with the patient's rehab needs make it unreasonable for this patient to be served in a less intensive setting? Yes 2. Co-Morbidities requiring supervision/potential complications: HTM, DM, myelopathy, CKD with AKI, BMI of 40 3. Due to bladder management, bowel management, safety, skin/wound care, disease management, medication administration, pain management and patient education, does the patient require 24 hr/day rehab nursing? Yes 4. Does the patient require coordinated care of a physician, rehab nurse, PT, OT, and SLP to address physical and functional deficits in the  context of the above medical diagnosis(es)? Yes Addressing deficits in the following areas: balance, endurance, locomotion, strength, transferring, bowel/bladder control, bathing, dressing, feeding, grooming and toileting 5. Can the patient actively participate in an intensive therapy program of at least 3 hrs of therapy 5 days a week? Yes 6. The potential for patient to make measurable gains while on inpatient rehab is fair 7. Anticipated functional outcomes upon discharge from inpatient rehab: supervision and min assist PT, supervision and min assist OT, n/a SLP7-10 days 8. Estimated rehab length of stay to reach the above functional goals is: 7-10 days 9. Anticipated discharge destination: Home 10. Overall Rehab/Functional Prognosis: fair     MD Signature:          Revision History

## 2019-09-22 NOTE — Progress Notes (Signed)
Pt admitted to room 4W26. Oriented to room, floor, and rehab fall policy. C/O pain on upper back and posterior neck, waiting on Rx to verify orders. Currently resting in bed with call bell within reach.   Gerald Stabs, RN

## 2019-09-22 NOTE — Progress Notes (Signed)
D/C to CIR as ordered. Pt alert/oreinted in no apparent distress. Pt in wheelchair accompanied by staff and family to [phone]. No complaints voiced.

## 2019-09-22 NOTE — Progress Notes (Signed)
Inpatient Rehabilitation Medication Review by a Pharmacist  A complete drug regimen review was completed for this patient to identify any potential clinically significant medication issues.  Clinically significant medication issues were identified:  no  Pharmacist comments: no issues  Time spent performing this drug regimen review (minutes):  10   Richardine Service 09/22/2019 7:15 PM

## 2019-09-22 NOTE — Discharge Summary (Signed)
Physician Discharge Summary  Doris Jackson [id number] DOB: 02-20-1941 DOA: 09/14/2019  PCP: Caren Macadam, MD  Admit date: 09/14/2019 Discharge date: 09/22/2019  Admitted From: Home Disposition:  CIR  Recommendations for Outpatient Follow-up:  1. Follow up with PCP in 1-2 weeks   Discharge Condition:Stable CODE STATUS:Full Diet recommendation: Dysphagia 1   Brief/Interim Summary: 79yo with a hx of DM, HTN, HLD presented with symptomatic cord compression due to severe spinal stenosis, now s/p surgery per Neurosurgery. Therapy recs for CIR.  Discharge Diagnoses:  Principal Problem:   Spinal stenosis in cervical region Active Problems:   DM (diabetes mellitus) type II controlled with renal manifestation (HCC)   Dyslipidemia   Microcytic anemia   Essential hypertension   Obesity, morbid, BMI 40.0-49.9 (HCC)   AKI (acute kidney injury) (HCC)   GERD (gastroesophageal reflux disease)   Numbness and tingling   Cervical spinal stenosis   Cervical spondylosis with myelopathy and radiculopathy   Cervical spinal stenosis with cord compression Patient presented with weakness and paresthesias to bilateral upper extremities, left greater than right. Was noted to have significant cervical spinal stenosis with cord compression on imaging. Evaluated by neurosurgery and underwent anterior cervical decompression with fusion by neurosurgery, Dr. Christella Noa on 09/15/2019. Completed course of Decadron.PT and OT recommend CIR placement to obtain independence back to her previous baseline; currently requiring max assist with much of her ADLs currently in which she was very independent at baseline prior to surgical intervention. --Neurosurgery following. As of 3/23, seems appropriate for CIR --Continue therapy while inpatient --Currently tolerating pured diet --Will d/c to CIR today  Acute renal failure: resolved Creatinine 1.78 on admission (baseline 1.2), likely secondary to prerenal  azotemia from poor oral intake in the days preceding hospitalization. Received IV fluid hydration with improvement of creatinine to 1.18 on most recent check --Would cont to avoid nephrotoxins, renally dose all medications  Essential hypertension --continue home amlodipine-benazepril 5-40mg  PO daily, HCTZ 25mg  PO daily, metoprolol succinate 50mg  PO daily -BP stable and controlled at this time  Type 2 diabetes mellitus On Januvia 100 mg p.o. daily, Actos 45 mg p.o. daily, Metformin 500 mg p.o. twice daily, glipizide 5 mg p.o. daily at home. Hemoglobin A1c 7.0 on 08/16/2019, well controlled. --Glipizide and Actos --Lantus 10 unit subcutaneously daily --Insulin sliding scale for further coverage --glucose trends reviewed. For the most part, controlled  GERD: Continue PPI as tolerated  Microcytic anemia:Continue iron supplementation 150 mg p.o. daily  Hyperlipidemia:Continue statin as toelrated   Discharge Instructions   Allergies as of 09/22/2019      Reactions   Dust Mite Extract Itching, Cough      Medication List    STOP taking these medications   dexamethasone 4 MG tablet Commonly known as: DECADRON   metFORMIN 500 MG 24 hr tablet Commonly known as: Glucophage XR   sitaGLIPtin 100 MG tablet Commonly known as: Januvia     TAKE these medications   acetaminophen 500 MG tablet Commonly known as: TYLENOL Take 500 mg by mouth every 6 (six) hours as needed for mild pain.   amLODipine-benazepril 5-40 MG capsule Commonly known as: LOTREL TAKE 1 CAPSULE BY MOUTH EVERY DAY What changed:   how much to take  how to take this  when to take this  additional instructions   Azelastine-Fluticasone 137-50 MCG/ACT Susp Place 1 spray into both nostrils daily. What changed:   when to take this  reasons to take this   cyanocobalamin 2000 MCG tablet Take 2,000  mcg by mouth daily.   cyclobenzaprine 5 MG tablet Commonly known as: FLEXERIL Take 1 tablet (5 mg  total) by mouth at bedtime. What changed:   when to take this  reasons to take this   fexofenadine 180 MG tablet Commonly known as: ALLEGRA Take 90 mg by mouth daily as needed for allergies.   glipiZIDE 5 MG 24 hr tablet Commonly known as: GLUCOTROL XL TAKE 1 TABLET BY MOUTH EVERY DAY   glucose blood test strip Commonly known as: OneTouch Verio TEST TWICE A DAY.  Dx E11.9   hydrochlorothiazide 25 MG tablet Commonly known as: HYDRODIURIL Take 1 tablet (25 mg total) by mouth daily.   HYDROcodone-acetaminophen 5-325 MG tablet Commonly known as: NORCO/VICODIN Take 1 tablet by mouth every 4 (four) hours as needed for moderate pain ((score 4 to 6)).   insulin glargine 100 UNIT/ML injection Commonly known as: LANTUS Inject 0.1 mLs (10 Units total) into the skin daily. Start taking on: September 23, 2019   iron polysaccharides 150 MG capsule Commonly known as: Ferrex 150 Take 1 capsule (150 mg total) by mouth daily.   Lancets 30G Misc USE TWICE DAILY AS DIRECTED FOR GLUCOSE TESTING AND MONITORING   ONE TOUCH LANCETS Misc Test twice daily.   LORazepam 0.5 MG tablet Commonly known as: ATIVAN Take 1 tablet (0.5 mg total) by mouth every 6 (six) hours as needed for anxiety.   Magnesium 250 MG Tabs Take 1 tablet by mouth as needed.   metoprolol succinate 50 MG 24 hr tablet Commonly known as: TOPROL-XL Take 1 tablet (50 mg total) by mouth daily. Take with or immediately following a meal. Start taking on: September 23, 2019   Nasacort Allergy 24HR 8 MCG/ACT Aero nasal inhaler Generic drug: triamcinolone Place 2 sprays into the nose daily as needed (allergies).   omeprazole 40 MG capsule Commonly known as: PRILOSEC Take 40 mg by mouth at bedtime.   pioglitazone 45 MG tablet Commonly known as: ACTOS TAKE 1 TABLET BY MOUTH EVERY DAY   pravastatin 40 MG tablet Commonly known as: PRAVACHOL Take 1 tablet (40 mg total) by mouth daily.   prednisoLONE acetate 1 % ophthalmic  suspension Commonly known as: PRED FORTE Place 1 drop into both eyes See admin instructions. Place 1 drop into left eye four times daily Place 1 drop into right eye twice daily   PROBIOTIC-10 PO Take 1 capsule by mouth daily.   vitamin C 1000 MG tablet Take 1,000 mg by mouth 2 (two) times daily as needed.   Vitamin D 50 MCG (2000 UT) Caps Take 2 capsules by mouth daily.   zolpidem 5 MG tablet Commonly known as: AMBIEN Take 1 tablet (5 mg total) by mouth at bedtime as needed for sleep.      Follow-up Information    Caren Macadam, MD. Schedule an appointment as soon as possible for a visit in 2 week(s).   Specialty: Family Medicine Contact information: [address] [place] [address] [phone]          Allergies  Allergen Reactions  . Dust Mite Extract Itching and Cough    Consultations:  Neurosurgery  CIR  Procedures/Studies: DG Chest 2 View  Result Date: 09/15/2019 CLINICAL DATA:  Diabetes, shortness of breath EXAM: CHEST - 2 VIEW COMPARISON:  04/19/2019 FINDINGS: Cardiomegaly, vascular congestion. No confluent opacities or edema. No effusions. Calcified granuloma at the left lung base. IMPRESSION: Cardiomegaly, vascular congestion. Calcified left base granuloma. No acute findings. Electronically Signed  By: Rolm Baptise M.D.   On: 09/15/2019 10:59   DG Cervical Spine 2-3 Views  Result Date: 09/15/2019 CLINICAL DATA:  Cervical corpectomy and fusion. EXAM: CERVICAL SPINE - 2-3 VIEW COMPARISON:  August 24, 2019 FINDINGS: Three portable cross-table lateral films demonstrate interval ACDF from C3 to C4 with interbody disc graft placement. The surgical hardware plate extends distally. This more distal hardware and the more distal cervical spine are obscured by superimposition of the shoulder soft tissues. The C2-3 alignment is normal. IMPRESSION: Intraoperative cross-table lateral films demonstrates interval ACDF from C3 to C4 with interbody disc  graft placement, the more surgical hardware plate extending distally. The more distal cervical spine and post-operative changes obscured by superimposition of the shoulder soft tissues. Electronically Signed   By: Revonda Humphrey   On: 09/15/2019 21:57   DG Cervical Spine Complete  Result Date: 08/24/2019 CLINICAL DATA:  79 year old with bilateral hand numbness and neck pain. EXAM: CERVICAL SPINE - COMPLETE 4+ VIEW COMPARISON:  None. FINDINGS: Trace retrolisthesis of C3 on C4. Alignment is otherwise maintained. Disc space narrowing and endplate spurring most prominent at C4-C5, C5-C6, and C6-C7. Multilevel facet hypertrophy with bilateral neural foraminal narrowing. No evidence of fracture or focal bone lesion. The dens is grossly intact. No prevertebral soft tissue edema. IMPRESSION: Multilevel degenerative disc disease and facet hypertrophy with bilateral neural foraminal narrowing. Trace retrolisthesis of C3 on C4. No evidence of acute bony abnormality. Electronically Signed   By: Keith Rake M.D.   On: 08/24/2019 17:31   MR BRAIN WO CONTRAST  Result Date: 09/10/2019 CLINICAL DATA:  Generalized weakness with tremors in both upper extremities. Difficulty walking. EXAM: MRI HEAD WITHOUT CONTRAST MRI CERVICAL SPINE WITHOUT CONTRAST TECHNIQUE: Multiplanar, multiecho pulse sequences of the brain and surrounding structures, and cervical spine, to include the craniocervical junction and cervicothoracic junction, were obtained without intravenous contrast. COMPARISON:  None. FINDINGS: MRI HEAD FINDINGS Brain: No acute infarct, acute hemorrhage or extra-axial collection. Mild white matter hyperintensity, most commonly due to chronic ischemic microangiopathy, though not unexpected for age. Normal volume of CSF spaces. No chronic microhemorrhage. Normal midline structures. Vascular: Normal flow voids. Skull and upper cervical spine: Normal marrow signal. Sinuses/Orbits: Negative. Other: None. MRI CERVICAL SPINE  FINDINGS Alignment: Physiologic. Vertebrae: Mild multilevel degenerative endplate signal changes. Large anterior osteophytes at the C4-7 levels. Cord: Hyperintense T2-weighted signal within the spinal cord at the C3-4 and C5-6 levels. Posterior Fossa, vertebral arteries, paraspinal tissues: Negative. Disc levels: C1-C2: Unremarkable. C2-C3: Normal disc space and facet joints. There is no spinal canal stenosis. No neural foraminal stenosis. C3-C4: Intermediate disc bulge with superimposed medium-sized central extrusion with mild inferior migration and small left foraminal protrusion. Severe spinal canal stenosis. There is compression of the spinal cord with mild signal change. Moderate right and severe left neural foraminal stenosis. C4-C5: Intermediate sized disc osteophyte complex. Moderate spinal canal stenosis. Facet hypertrophy contributes to moderate right neural foraminal stenosis. No left neural foraminal stenosis. C5-C6: Intermediate sized left asymmetric disc osteophyte complex with mass effect on the left hemicord. Mild spinal canal stenosis. Moderate bilateral neural foraminal stenosis. C6-C7: Small disc osteophyte complex, left eccentric. There is no spinal canal stenosis. Mild right, moderate left neural foraminal stenosis. C7-T1: Disc space narrowing without herniation. There is no spinal canal stenosis. No neural foraminal stenosis. IMPRESSION: 1. Normal aging brain. 2. Severe spinal canal stenosis at C3-C4 with compression of the spinal cord and associated mild signal change. 3. Moderate spinal canal stenosis at C4-C5 with mild  mass effect on the left hemicord. 4. Multilevel moderate to severe neural foraminal stenosis. Critical Value/emergent results were called by telephone at the time of interpretation on 09/10/2019 at 11:24 pm to Dr. Blanchie Dessert , who verbally acknowledged these results. Electronically Signed   By: Ulyses Jarred M.D.   On: 09/10/2019 23:25   MR CERVICAL SPINE WO  CONTRAST  Result Date: 09/10/2019 CLINICAL DATA:  Generalized weakness with tremors in both upper extremities. Difficulty walking. EXAM: MRI HEAD WITHOUT CONTRAST MRI CERVICAL SPINE WITHOUT CONTRAST TECHNIQUE: Multiplanar, multiecho pulse sequences of the brain and surrounding structures, and cervical spine, to include the craniocervical junction and cervicothoracic junction, were obtained without intravenous contrast. COMPARISON:  None. FINDINGS: MRI HEAD FINDINGS Brain: No acute infarct, acute hemorrhage or extra-axial collection. Mild white matter hyperintensity, most commonly due to chronic ischemic microangiopathy, though not unexpected for age. Normal volume of CSF spaces. No chronic microhemorrhage. Normal midline structures. Vascular: Normal flow voids. Skull and upper cervical spine: Normal marrow signal. Sinuses/Orbits: Negative. Other: None. MRI CERVICAL SPINE FINDINGS Alignment: Physiologic. Vertebrae: Mild multilevel degenerative endplate signal changes. Large anterior osteophytes at the C4-7 levels. Cord: Hyperintense T2-weighted signal within the spinal cord at the C3-4 and C5-6 levels. Posterior Fossa, vertebral arteries, paraspinal tissues: Negative. Disc levels: C1-C2: Unremarkable. C2-C3: Normal disc space and facet joints. There is no spinal canal stenosis. No neural foraminal stenosis. C3-C4: Intermediate disc bulge with superimposed medium-sized central extrusion with mild inferior migration and small left foraminal protrusion. Severe spinal canal stenosis. There is compression of the spinal cord with mild signal change. Moderate right and severe left neural foraminal stenosis. C4-C5: Intermediate sized disc osteophyte complex. Moderate spinal canal stenosis. Facet hypertrophy contributes to moderate right neural foraminal stenosis. No left neural foraminal stenosis. C5-C6: Intermediate sized left asymmetric disc osteophyte complex with mass effect on the left hemicord. Mild spinal canal  stenosis. Moderate bilateral neural foraminal stenosis. C6-C7: Small disc osteophyte complex, left eccentric. There is no spinal canal stenosis. Mild right, moderate left neural foraminal stenosis. C7-T1: Disc space narrowing without herniation. There is no spinal canal stenosis. No neural foraminal stenosis. IMPRESSION: 1. Normal aging brain. 2. Severe spinal canal stenosis at C3-C4 with compression of the spinal cord and associated mild signal change. 3. Moderate spinal canal stenosis at C4-C5 with mild mass effect on the left hemicord. 4. Multilevel moderate to severe neural foraminal stenosis. Critical Value/emergent results were called by telephone at the time of interpretation on 09/10/2019 at 11:24 pm to Dr. Blanchie Dessert , who verbally acknowledged these results. Electronically Signed   By: Ulyses Jarred M.D.   On: 09/10/2019 23:25    Subjective: Eager to start rehab  Discharge Exam: Vitals:   09/22/19 0352 09/22/19 0723  BP: (!) 140/59 136/77  Pulse: 87 (!) 101  Resp: 16 15  Temp: 98.5 F (36.9 C) 97.6 F (36.4 C)  SpO2: 98% 100%   Vitals:   09/21/19 1437 09/21/19 2002 09/22/19 0352 09/22/19 0723  BP: (!) 111/58 116/82 (!) 140/59 136/77  Pulse: 88 95 87 (!) 101  Resp: 16 16 16 15   Temp: 97.9 F (36.6 C) 97.7 F (36.5 C) 98.5 F (36.9 C) 97.6 F (36.4 C)  TempSrc: Oral Oral Oral Oral  SpO2: 99% 98% 98% 100%  Weight:      Height:        General: Pt is alert, awake, not in acute distress Cardiovascular: RRR, S1/S2 +, no rubs, no gallops Respiratory: CTA bilaterally, no wheezing, no rhonchi  Abdominal: Soft, NT, ND, bowel sounds + Extremities: no edema, no cyanosis   The results of significant diagnostics from this hospitalization (including imaging, microbiology, ancillary and laboratory) are listed below for reference.     Microbiology: Recent Results (from the past 240 hour(s))  SARS CORONAVIRUS 2 (TAT 6-24 HRS) Nasopharyngeal Nasopharyngeal Swab     Status: None    Collection Time: 09/14/19  2:13 PM   Specimen: Nasopharyngeal Swab  Result Value Ref Range Status   SARS Coronavirus 2 NEGATIVE NEGATIVE Final    Comment: (NOTE) SARS-CoV-2 target nucleic acids are NOT DETECTED. The SARS-CoV-2 RNA is generally detectable in upper and lower respiratory specimens during the acute phase of infection. Negative results do not preclude SARS-CoV-2 infection, do not rule out co-infections with other pathogens, and should not be used as the sole basis for treatment or other patient management decisions. Negative results must be combined with clinical observations, patient history, and epidemiological information. The expected result is Negative. Fact Sheet for Patients: SugarRoll.be Fact Sheet for Healthcare Providers: https://www.woods-mathews.com/ This test is not yet approved or cleared by the [place] FDA and  has been authorized for detection and/or diagnosis of SARS-CoV-2 by FDA under an Emergency Use Authorization (EUA). This EUA will remain  in effect (meaning this test can be used) for the duration of the COVID-19 declaration under Section 56 4(b)(1) of the Act, 21 U.S.C. section 360bbb-3(b)(1), unless the authorization is terminated or revoked sooner. Performed at [facility], [place] [address], [place], [place] [address]   Surgical pcr screen     Status: None   Collection Time: 09/15/19 10:34 AM   Specimen: Nasal Mucosa; Nasal Swab  Result Value Ref Range Status   MRSA, PCR NEGATIVE NEGATIVE Final   Staphylococcus aureus NEGATIVE NEGATIVE Final    Comment: (NOTE) The Xpert SA Assay (FDA approved for NASAL specimens in patients 22 years of age and older), is one component of a comprehensive surveillance program. It is not intended to diagnose infection nor to guide or monitor treatment. Performed at [facility], [place] [address], [place],  [address]      Labs: BNP (last 3  results) Recent Labs    09/10/19 1529  BNP [id number]   Basic Metabolic Panel: Recent Labs  Lab 09/16/19 0248 09/16/19 0422 09/17/19 0346 09/19/19 1230  NA 143 141 139  --   K 7.0* 4.9 4.2  --   CL 114* 105 102  --   CO2 20* 24 26  --   GLUCOSE 191* 221* 169* 209*  BUN 28* 34* 29*  --   CREATININE 1.03* 1.17* 1.18*  --   CALCIUM 7.3* 8.5* 8.5*  --    Liver Function Tests: No results for input(s): AST, ALT, ALKPHOS, BILITOT, PROT, ALBUMIN in the last 168 hours. No results for input(s): LIPASE, AMYLASE in the last 168 hours. No results for input(s): AMMONIA in the last 168 hours. CBC: Recent Labs  Lab 09/16/19 0248  WBC 11.4*  HGB 8.8*  HCT 26.0*  MCV 78.8*  PLT 124*   Cardiac Enzymes: No results for input(s): CKTOTAL, CKMB, CKMBINDEX, TROPONINI in the last 168 hours. BNP: Invalid input(s): POCBNP CBG: Recent Labs  Lab 09/21/19 1122 09/21/19 1626 09/21/19 2025 09/22/19 0626 09/22/19 1135  GLUCAP 165* 245* 295* 140* 202*   D-Dimer No results for input(s): DDIMER in the last 72 hours. Hgb A1c No results for input(s): HGBA1C in the last 72 hours. Lipid Profile No results for  input(s): CHOL, HDL, LDLCALC, TRIG, CHOLHDL, LDLDIRECT in the last 72 hours. Thyroid function studies No results for input(s): TSH, T4TOTAL, T3FREE, THYROIDAB in the last 72 hours.  Invalid input(s): FREET3 Anemia work up No results for input(s): VITAMINB12, FOLATE, FERRITIN, TIBC, IRON, RETICCTPCT in the last 72 hours. Urinalysis    Component Value Date/Time   COLORURINE YELLOW 09/10/2019 1715   APPEARANCEUR CLEAR 09/10/2019 1715   LABSPEC 1.010 09/10/2019 1715   PHURINE 5.0 09/10/2019 1715   GLUCOSEU NEGATIVE 09/10/2019 1715   HGBUR NEGATIVE 09/10/2019 1715   [place] 09/10/2019 1715   BILIRUBINUR negative 09/11/2016 1139   KETONESUR 5 (A) 09/10/2019 1715   PROTEINUR NEGATIVE 09/10/2019 1715   UROBILINOGEN 0.2 09/11/2016 1139   NITRITE NEGATIVE 09/10/2019 1715    LEUKOCYTESUR NEGATIVE 09/10/2019 1715   Sepsis Labs Invalid input(s): PROCALCITONIN,  WBC,  LACTICIDVEN Microbiology Recent Results (from the past 240 hour(s))  SARS CORONAVIRUS 2 (TAT 6-24 HRS) Nasopharyngeal Nasopharyngeal Swab     Status: None   Collection Time: 09/14/19  2:13 PM   Specimen: Nasopharyngeal Swab  Result Value Ref Range Status   SARS Coronavirus 2 NEGATIVE NEGATIVE Final    Comment: (NOTE) SARS-CoV-2 target nucleic acids are NOT DETECTED. The SARS-CoV-2 RNA is generally detectable in upper and lower respiratory specimens during the acute phase of infection. Negative results do not preclude SARS-CoV-2 infection, do not rule out co-infections with other pathogens, and should not be used as the sole basis for treatment or other patient management decisions. Negative results must be combined with clinical observations, patient history, and epidemiological information. The expected result is Negative. Fact Sheet for Patients: SugarRoll.be Fact Sheet for Healthcare Providers: https://www.woods-mathews.com/ This test is not yet approved or cleared by the [place] FDA and  has been authorized for detection and/or diagnosis of SARS-CoV-2 by FDA under an Emergency Use Authorization (EUA). This EUA will remain  in effect (meaning this test can be used) for the duration of the COVID-19 declaration under Section 56 4(b)(1) of the Act, 21 U.S.C. section 360bbb-3(b)(1), unless the authorization is terminated or revoked sooner. Performed at [facility], [place] [address] [place], [place] [address]   Surgical pcr screen     Status: None   Collection Time: 09/15/19 10:34 AM   Specimen: Nasal Mucosa; Nasal Swab  Result Value Ref Range Status   MRSA, PCR NEGATIVE NEGATIVE Final   Staphylococcus aureus NEGATIVE NEGATIVE Final    Comment: (NOTE) The Xpert SA Assay (FDA approved for NASAL specimens in patients 36 years of age and  older), is one component of a comprehensive surveillance program. It is not intended to diagnose infection nor to guide or monitor treatment. Performed at [facility], [place] [address], [place], [place] [address]    Time spent: 30 min  SIGNED:   Marylu Lund, MD  [facility] 09/22/2019, 1:48 PM  If 7PM-7AM, please contact night-coverage

## 2019-09-22 NOTE — H&P (Signed)
Physical Medicine and Rehabilitation Admission H&P    CC: Functional decline due to cervical myelopathy.    HPI: Doris Jackson is a 79 year old female with history of HTN, T2DM, microcytic anemia, recent evaluation in the ED revealing cord compression with severe spinal stenosis C3-C6 but refused admission therefore was discharged to home on steroids per NS.  She was admitted via ED on 09/14/2019 with worsening of numbness and tingling in bilateral hands and feet as well as balance deficits.  She was taken to the OR on 03/18 for ACDF C4/5 with C5 cervical corpectomy and arthrodesis C4-C6.  Hospital course significant for AKI due to prerenal azotemia--was treated with IV fluids for hydration as well as reports of throat discomfort and problem swallowing.  Speech therapy evaluated patient and recommended regular diet however patient preferred advance per her comfort level. Lantus added due to poorly controlled BS. ABLA with thrombocytopenia being monitored. Therapy ongoing and patient limited by pain.  However although pt has refused therapy due to pain, when asked, she says her pain is ~ 3/10 when moving with therapy and "expects some pain".   Also notes legs and hands get numb and feels like ice- also has associated tingling.   LBM last night- not eating much- first BM in a few days- urinating OK.  On puree due to swallowing issues.     Review of Systems  Constitutional: Negative for chills and fever.  HENT: Negative for hearing loss and tinnitus.   Eyes: Negative for blurred vision and double vision.  Respiratory: Negative for cough and shortness of breath.   Cardiovascular: Positive for leg swelling (tend to swell). Negative for chest pain and palpitations.  Gastrointestinal: Negative for constipation, heartburn and nausea.       Swallowing issues, so on puree  Genitourinary: Negative for dysuria and urgency.  Musculoskeletal: Positive for myalgias and neck pain.  Skin: Negative  for itching and rash.  Neurological: Positive for sensory change (BUE) and focal weakness.  Psychiatric/Behavioral: The patient does not have insomnia.   All other systems reviewed and are negative.     Past Medical History:  Diagnosis Date  . ALLERGIC RHINITIS 06/17/2010  . ANEMIA-NOS 06/17/2010  . BREAST CANCER, HX OF 06/17/2010  . Cancer [facility]) 2008   right breast  . Cataract   . COPD 06/17/2010  . DIABETES MELLITUS, TYPE II 06/17/2010  . Endometriosis   . Fibroid    FIBROID  . HYPERLIPIDEMIA 06/17/2010  . HYPERTENSION 06/17/2010  . Leg swelling   . LOW BACK PAIN 06/17/2010  . OSTEOARTHRITIS 06/17/2010  . OSTEOPENIA 06/17/2010  . Weakness     Past Surgical History:  Procedure Laterality Date  . ABDOMINAL HYSTERECTOMY  1988   TAH,LSO  . ANTERIOR CERVICAL DECOMP/DISCECTOMY FUSION N/A 09/15/2019   Procedure: Cervical five CorpectomyANTERIOR CERVICAL DECOMPRESSION/DISCECTOMY FUSION CERVICAL THREE-CERVICAL Four;  Surgeon: Ashok Pall, MD;  Location: [place];  Service: Neurosurgery;  Laterality: N/A;  . BREAST LUMPECTOMY  2007   LUMPECTOMY FOLLOWED BY RADIATION  . OOPHORECTOMY  1988   TAH,LSO  . TUBAL LIGATION      Family History  Problem Relation Age of Onset  . Diabetes Mother   . Hypertension Sister   . Asthma Father   . Diabetes Brother     Social History:  reports that she has never smoked. She has never used smokeless tobacco. She reports that she does not drink alcohol or use drugs.    Allergies  Allergen Reactions  .  Dust Mite Extract Itching and Cough    Medications Prior to Admission  Medication Sig Dispense Refill  . acetaminophen (TYLENOL) 500 MG tablet Take 500 mg by mouth every 6 (six) hours as needed for mild pain.    Marland Kitchen amLODipine-benazepril (LOTREL) 5-40 MG capsule TAKE 1 CAPSULE BY MOUTH EVERY DAY (Patient taking differently: Take 1 capsule by mouth daily. ) 90 capsule 1  . Ascorbic Acid (VITAMIN C) 1000 MG tablet Take 1,000 mg by mouth 2  (two) times daily as needed.     . Azelastine-Fluticasone 137-50 MCG/ACT SUSP Place 1 spray into both nostrils daily. (Patient taking differently: Place 1 spray into both nostrils daily as needed (rhinitis). ) 1 Bottle 2  . Cholecalciferol (VITAMIN D) 2000 UNITS CAPS Take 2 capsules by mouth daily.     . cyanocobalamin 2000 MCG tablet Take 2,000 mcg by mouth daily.      . cyclobenzaprine (FLEXERIL) 5 MG tablet Take 1 tablet (5 mg total) by mouth at bedtime. (Patient taking differently: Take 5 mg by mouth at bedtime as needed for muscle spasms. ) 60 tablet 3  . fexofenadine (ALLEGRA) 180 MG tablet Take 90 mg by mouth daily as needed for allergies.     Marland Kitchen glipiZIDE (GLUCOTROL XL) 5 MG 24 hr tablet TAKE 1 TABLET BY MOUTH EVERY DAY (Patient taking differently: Take 5 mg by mouth daily. ) 90 tablet 1  . glucose blood (ONETOUCH VERIO) test strip TEST TWICE A DAY.  Dx E11.9 200 each 4  . hydrochlorothiazide (HYDRODIURIL) 25 MG tablet Take 1 tablet (25 mg total) by mouth daily. 90 tablet 1  . HYDROcodone-acetaminophen (NORCO/VICODIN) 5-325 MG tablet Take 1 tablet by mouth every 4 (four) hours as needed for moderate pain ((score 4 to 6)). 30 tablet 0  . [START ON 09/23/2019] insulin glargine (LANTUS) 100 UNIT/ML injection Inject 0.1 mLs (10 Units total) into the skin daily. 10 mL 11  . iron polysaccharides (FERREX 150) 150 MG capsule Take 1 capsule (150 mg total) by mouth daily. 90 capsule 1  . Lancets 30G MISC USE TWICE DAILY AS DIRECTED FOR GLUCOSE TESTING AND MONITORING 100 each 4  . LORazepam (ATIVAN) 0.5 MG tablet Take 1 tablet (0.5 mg total) by mouth every 6 (six) hours as needed for anxiety. 30 tablet 0  . Magnesium 250 MG TABS Take 1 tablet by mouth as needed.     Derrill Memo ON 09/23/2019] metoprolol succinate (TOPROL-XL) 50 MG 24 hr tablet Take 1 tablet (50 mg total) by mouth daily. Take with or immediately following a meal.    . omeprazole (PRILOSEC) 40 MG capsule Take 40 mg by mouth at bedtime.    Marland Kitchen  ONE TOUCH LANCETS MISC Test twice daily. 200 each 5  . pioglitazone (ACTOS) 45 MG tablet TAKE 1 TABLET BY MOUTH EVERY DAY (Patient taking differently: Take 45 mg by mouth daily. ) 90 tablet 0  . pravastatin (PRAVACHOL) 40 MG tablet Take 1 tablet (40 mg total) by mouth daily. 90 tablet 4  . prednisoLONE acetate (PRED FORTE) 1 % ophthalmic suspension Place 1 drop into both eyes See admin instructions. Place 1 drop into left eye four times daily Place 1 drop into right eye twice daily    . Probiotic Product (PROBIOTIC-10 PO) Take 1 capsule by mouth daily.     Marland Kitchen triamcinolone (NASACORT ALLERGY 24HR) 55 MCG/ACT AERO nasal inhaler Place 2 sprays into the nose daily as needed (allergies).     . zolpidem (AMBIEN) 5 MG  tablet Take 1 tablet (5 mg total) by mouth at bedtime as needed for sleep. 30 tablet 0    Drug Regimen Review  Drug regimen was reviewed and remains appropriate with no significant issues identified  Home:     Functional History:    Functional Status:  Mobility:          ADL:    Cognition:       There were no vitals taken for this visit. Physical Exam  Nursing note and vitals reviewed. Constitutional: She is oriented to person, place, and time. She appears well-developed and well-nourished.  Morbidly obese female  Sitting up in bedside chair, daughter who's cognitively impaired at bedside wearing mask, pt in hard cervical collar, NAD  HENT:  Head: Normocephalic and atraumatic.  Nose: Nose normal.  Mouth/Throat: Oropharynx is clear and moist. No oropharyngeal exudate.  Wearing cervical collar- incision anterior on L- looks great- glued- no drainage, no erythema No facial droop  Eyes: Conjunctivae are normal. Right eye exhibits no discharge. Left eye exhibits no discharge. No scleral icterus.  EOMI B/L- no nystagmus  Neck: No tracheal deviation present.  Collar in place; incision as above  Cardiovascular: Normal rate, regular rhythm and normal heart sounds. Exam  reveals no gallop and no friction rub.  No murmur heard. Respiratory: Effort normal and breath sounds normal. No stridor. No respiratory distress. She has no wheezes. She has no rales.  GI:  Soft, NT; ND;  (+) BS  Musculoskeletal:        General: Edema (1+ pedal edema bilaterally) present.     Cervical back: Neck supple.     Comments: RUE- biceps 4-/5, triceps 4-/5, WE 4/5, grip 4/5, finger abd 4-/5 LUE- same except finger abd 3+/5- otherwise as above  RLE- HF 3-/5, KE 4+/5, DF 4/5, PF 4+/5, EHL 4+/5 LLE- HF 2/5, KE 4/5, DF 4-/5, PF 4/5, EHL 4-/5  Neurological: She is alert and oriented to person, place, and time. No cranial nerve deficit.  Speech clear. Follows commands without difficulty.   Sensation decreased from C6 to T1, B/L but better on RUE than LUE On torso T2-T12 "normal" per pt Decreased in B/L LEs RLE>>>LLE No hoffman's no clonus B/L  Skin: Skin is warm and dry.  Psychiatric: She has a normal mood and affect.  Talkative; otherwise appropriate    Results for orders placed or performed during the hospital encounter of 09/14/19 (from the past 48 hour(s))  Glucose, capillary     Status: Abnormal   Collection Time: 09/20/19  4:43 PM  Result Value Ref Range   Glucose-Capillary 247 (H) 70 - 99 mg/dL    Comment: Glucose reference range applies only to samples taken after fasting for at least 8 hours.  Glucose, capillary     Status: Abnormal   Collection Time: 09/20/19  8:39 PM  Result Value Ref Range   Glucose-Capillary 129 (H) 70 - 99 mg/dL    Comment: Glucose reference range applies only to samples taken after fasting for at least 8 hours.  Glucose, capillary     Status: Abnormal   Collection Time: 09/21/19 12:09 AM  Result Value Ref Range   Glucose-Capillary 135 (H) 70 - 99 mg/dL    Comment: Glucose reference range applies only to samples taken after fasting for at least 8 hours.  Glucose, capillary     Status: Abnormal   Collection Time: 09/21/19  6:54 AM  Result  Value Ref Range   Glucose-Capillary 143 (H) 70 - 99  mg/dL    Comment: Glucose reference range applies only to samples taken after fasting for at least 8 hours.  Glucose, capillary     Status: Abnormal   Collection Time: 09/21/19 11:22 AM  Result Value Ref Range   Glucose-Capillary 165 (H) 70 - 99 mg/dL    Comment: Glucose reference range applies only to samples taken after fasting for at least 8 hours.  Glucose, capillary     Status: Abnormal   Collection Time: 09/21/19  4:26 PM  Result Value Ref Range   Glucose-Capillary 245 (H) 70 - 99 mg/dL    Comment: Glucose reference range applies only to samples taken after fasting for at least 8 hours.  Glucose, capillary     Status: Abnormal   Collection Time: 09/21/19  8:25 PM  Result Value Ref Range   Glucose-Capillary 295 (H) 70 - 99 mg/dL    Comment: Glucose reference range applies only to samples taken after fasting for at least 8 hours.  Glucose, capillary     Status: Abnormal   Collection Time: 09/22/19  6:26 AM  Result Value Ref Range   Glucose-Capillary 140 (H) 70 - 99 mg/dL    Comment: Glucose reference range applies only to samples taken after fasting for at least 8 hours.  Glucose, capillary     Status: Abnormal   Collection Time: 09/22/19 11:35 AM  Result Value Ref Range   Glucose-Capillary 202 (H) 70 - 99 mg/dL    Comment: Glucose reference range applies only to samples taken after fasting for at least 8 hours.   No results found.     Medical Problem List and Plan: 1.  Impaired Function, ADLs, mobility secondary to cervical myelopathy due to severe cervical stenosis- cord compression- nontraumatic- s/p C3-C6 anterior decompression and arthrodesis s/p cage  -patient may  Shower with another collar  -ELOS/Goals: 2-3 weeks; mod I to supervision possibly 2.  Antithrombotics: -DVT/anticoagulation:  Mechanical: Sequential compression devices, below knee Bilateral lower extremities   -will need to check to see if can prescribe  Lovenox due to increased risk of DVT/PE  -antiplatelet therapy: N/A 3. Pain Management: Continues to be a limiting factor.  -pt reports well controlled- explained that's not a reason to not do therapy- need to let me know if needs more meds.   4. Mood: LCSW to follow for evaluation and support.   -antipsychotic agents: N/a 5. Neuropsych: This patient is capable of making decisions on her own behalf. 6. Skin/Wound Care: Routine pressure relief measures.  7. Fluids/Electrolytes/Nutrition: Monitor I/O. Check lytes in am.  8. GERD/Dysphagia: On dysphagia 1, thin liquids per patient preference--to advance at her comfort level. Pt reports on puree due to problems swallowing.  9.T2DM: Hgb A1c- 7.0. Poorly controlled due to recent steriods. Will monitor BS ac/hs. Continue Glucotrol and actos. Will change Glucerna to Ensure max and add CM restrictions to diet. Will d/c lantus--not home med and resume [place].   Add meal coverage tid.  10. HTN: Monitor BP tid. Continue Lotension/Norvasc and metoprolol.  11.  ABLA: Recheck CBC in am. Continue iron supplement.  12. Thrombocytopenia: Monitor for signs of bleeding. Recheck in am.  13. Leucocytosis: Monitor for signs of infection.  14. Pre-renal azotemia in setting of AKI on CKD Stage III: Encourage fluid intake. Recheck BMET in am.  15. Morbid Obesity- BMI >40- discuss weight loss in the long term-   Pamela S. Love, PA-C 09/22/19   I have personally performed a face to face diagnostic evaluation of  this patient and formulated the key components of the plan.  Additionally, I have personally reviewed laboratory data, imaging studies, as well as relevant notes and concur with the physician assistant's documentation above.   The patient's status has not changed from the original H&P.  Any changes in documentation from the acute care chart have been noted above.     Courtney Heys, MD 09/22/2019

## 2019-09-22 NOTE — Progress Notes (Signed)
Inpatient Rehabilitation Admissions Coordinator  CIR bed is available for patient today . I met with patient at beside and she is in agreement to admit. I have notified Dr. Wyline Copas as well as RN CM , Sharyn Lull. I will make the arrangements to admit today.   Danne Baxter, RN, MSN Rehab Admissions Coordinator [phone] 09/22/2019 10:32 AM

## 2019-09-22 NOTE — Care Management Important Message (Signed)
Important Message  Patient Details  Name: Doris Jackson MRN: [id number] Date of Birth: 07-21-1940   Medicare Important Message Given:        Memory Argue 09/22/2019, 3:05 PM

## 2019-09-22 NOTE — Plan of Care (Signed)
  Problem: Activity: Goal: Risk for activity intolerance will decrease Outcome: Progressing   Problem: Pain Managment: Goal: General experience of comfort will improve Outcome: Progressing   Problem: Safety: Goal: Ability to remain free from injury will improve Outcome: Progressing   

## 2019-09-22 NOTE — Progress Notes (Signed)
Report given to 4W staff, Pt will be in 4W26 once room is ready.

## 2019-09-23 ENCOUNTER — Encounter (HOSPITAL_COMMUNITY): Payer: Self-pay | Admitting: Physical Medicine and Rehabilitation

## 2019-09-23 ENCOUNTER — Inpatient Hospital Stay (HOSPITAL_COMMUNITY): Payer: Medicare HMO

## 2019-09-23 LAB — CBC WITH DIFFERENTIAL/PLATELET
Abs Immature Granulocytes: 0.1 10*3/uL — ABNORMAL HIGH (ref 0.00–0.07)
Basophils Absolute: 0 10*3/uL (ref 0.0–0.1)
Basophils Relative: 0 %
Eosinophils Absolute: 0.2 10*3/uL (ref 0.0–0.5)
Eosinophils Relative: 2 %
HCT: 29.3 % — ABNORMAL LOW (ref 36.0–46.0)
Hemoglobin: 9.6 g/dL — ABNORMAL LOW (ref 12.0–15.0)
Immature Granulocytes: 1 %
Lymphocytes Relative: 24 %
Lymphs Abs: 2.1 10*3/uL (ref 0.7–4.0)
MCH: 26.4 pg (ref 26.0–34.0)
MCHC: 32.8 g/dL (ref 30.0–36.0)
MCV: 80.7 fL (ref 80.0–100.0)
Monocytes Absolute: 1.2 10*3/uL — ABNORMAL HIGH (ref 0.1–1.0)
Monocytes Relative: 14 %
Neutro Abs: 5 10*3/uL (ref 1.7–7.7)
Neutrophils Relative %: 59 %
Platelets: 162 10*3/uL (ref 150–400)
RBC: 3.63 MIL/uL — ABNORMAL LOW (ref 3.87–5.11)
RDW: 16.9 % — ABNORMAL HIGH (ref 11.5–15.5)
WBC: 8.6 10*3/uL (ref 4.0–10.5)
nRBC: 0 % (ref 0.0–0.2)

## 2019-09-23 LAB — GLUCOSE, CAPILLARY
Glucose-Capillary: 120 mg/dL — ABNORMAL HIGH (ref 70–99)
Glucose-Capillary: 143 mg/dL — ABNORMAL HIGH (ref 70–99)
Glucose-Capillary: 155 mg/dL — ABNORMAL HIGH (ref 70–99)
Glucose-Capillary: 89 mg/dL (ref 70–99)

## 2019-09-23 LAB — COMPREHENSIVE METABOLIC PANEL
ALT: 16 U/L (ref 0–44)
AST: 14 U/L — ABNORMAL LOW (ref 15–41)
Albumin: 2.7 g/dL — ABNORMAL LOW (ref 3.5–5.0)
Alkaline Phosphatase: 71 U/L (ref 38–126)
Anion gap: 10 (ref 5–15)
BUN: 27 mg/dL — ABNORMAL HIGH (ref 8–23)
CO2: 29 mmol/L (ref 22–32)
Calcium: 8.8 mg/dL — ABNORMAL LOW (ref 8.9–10.3)
Chloride: 102 mmol/L (ref 98–111)
Creatinine, Ser: 1.18 mg/dL — ABNORMAL HIGH (ref 0.44–1.00)
GFR calc Af Amer: 52 mL/min — ABNORMAL LOW (ref >60)
GFR calc non Af Amer: 44 mL/min — ABNORMAL LOW (ref >60)
Glucose, Bld: 102 mg/dL — ABNORMAL HIGH (ref 70–99)
Potassium: 4.2 mmol/L (ref 3.5–5.1)
Sodium: 141 mmol/L (ref 135–145)
Total Bilirubin: 0.7 mg/dL (ref 0.3–1.2)
Total Protein: 6.2 g/dL — ABNORMAL LOW (ref 6.5–8.1)

## 2019-09-23 MED ORDER — ADULT MULTIVITAMIN W/MINERALS CH
1.0000 | ORAL_TABLET | Freq: Every day | ORAL | Status: DC
  Administered 2019-09-23 – 2019-10-07 (×15): 1 via ORAL
  Filled 2019-09-23 (×15): qty 1

## 2019-09-23 MED ORDER — HYDROCODONE-ACETAMINOPHEN 5-325 MG PO TABS
1.0000 | ORAL_TABLET | ORAL | Status: DC | PRN
  Administered 2019-09-23 – 2019-09-24 (×5): 1 via ORAL
  Filled 2019-09-23 (×3): qty 1
  Filled 2019-09-23: qty 2
  Filled 2019-09-23 (×2): qty 1
  Filled 2019-09-23: qty 2
  Filled 2019-09-23: qty 1

## 2019-09-23 MED ORDER — ENSURE ENLIVE PO LIQD: 237.0000 mL | Freq: Two times a day (BID) | ORAL | Status: DC

## 2019-09-23 NOTE — IPOC Note (Signed)
Overall Plan of Care [facility]) Patient Details Name: Doris Jackson MRN: [id number] DOB: [birth year]  Admitting Diagnosis: Cervical myelopathy [facility])  Hospital Problems: Principal Problem:   Cervical myelopathy ([place]) Active Problems:   DM (diabetes mellitus) type II controlled with renal manifestation ([place])   Obesity, morbid, BMI 40.0-49.9 ([place])   S/P cervical spinal fusion     Functional Problem List: Nursing Behavior, Bladder, Bowel, Edema, Endurance, Medication Management, Motor, Nutrition, Pain, Safety, Perception, Skin Integrity  PT Balance, Behavior, Endurance, Motor, Nutrition, Pain, Sensory, Skin Integrity  OT Balance, Endurance, Motor, Nutrition, Pain, Perception, Safety, Sensory  SLP    TR         Basic ADL's: OT Grooming, Bathing, Dressing, Toileting     Advanced  ADL's: OT       Transfers: PT Bed Mobility, Bed to Chair, Car, Manufacturing systems engineer, Metallurgist: PT Emergency planning/management officer, Ambulation, Stairs     Additional Impairments: OT Fuctional Use of Upper Extremity  SLP        TR      Anticipated Outcomes Item Anticipated Outcome  Self Feeding MOD I  Swallowing      Basic self-care  S  Toileting  S   Bathroom Transfers S  Bowel/Bladder  manage bowel and bladder with mid I assist  Transfers  mod I basic transfers; supervision car  Locomotion  mod I household gait distances; supervision stairs  Communication     Cognition     Pain  pain level less than 4 on scale of 0-10  Safety/Judgment  remain free of injury, prevent falls with cues and reminders   Therapy Plan: PT Intensity: Minimum of 1-2 x/day ,45 to 90 minutes PT Frequency: 5 out of 7 days PT Duration Estimated Length of Stay: 10-14 days OT Intensity: Minimum of 1-2 x/day, 45 to 90 minutes OT Frequency: 5 out of 7 days OT Duration/Estimated Length of Stay: 10-12     Due to the current state of emergency, patients may not be receiving their 3-hours of  Medicare-mandated therapy.   Team Interventions: Nursing Interventions Patient/Family Education, Bladder Management, Bowel Management, Skin Care/Wound Management, Cognitive Remediation/Compensation, Disease Management/Prevention, Psychosocial Support, Medication Management, Discharge Planning, Pain Management  PT interventions Ambulation/gait training, Balance/vestibular training, Community reintegration, Discharge planning, Disease management/prevention, DME/adaptive equipment instruction, Functional mobility training, Neuromuscular re-education, Pain management, Patient/family education, Psychosocial support, Skin care/wound management, Splinting/orthotics, Stair training, Therapeutic Activities, Therapeutic Exercise, UE/LE Strength taining/ROM, UE/LE Coordination activities, Wheelchair propulsion/positioning  OT Interventions Balance/vestibular training, Discharge planning, Pain management, Self Care/advanced ADL retraining, Therapeutic Activities, UE/LE Coordination activities, Therapeutic Exercise, Skin care/wound managment, Patient/family education, Functional mobility training, Disease mangement/prevention, Community reintegration, Engineer, drilling, Neuromuscular re-education, Psychosocial support, Splinting/orthotics, UE/LE Strength taining/ROM, Wheelchair propulsion/positioning  SLP Interventions    TR Interventions    SW/CM Interventions     Barriers to Discharge MD  Medical stability, Home enviroment access/loayout, Incontinence, Wound care, Lack of/limited family support, Weight and Weight bearing restrictions  Nursing      PT Decreased caregiver support husband works and pt has intermittent support  OT [employer]    SLP      SW       Team Discharge Planning: Destination: PT-Home ,OT- Home , SLP-  Projected Follow-up: PT-Home health PT, OT-  Home health OT, SLP-  Projected Equipment Needs: PT-Rolling walker with 5" wheels, OT- 3 in 1 bedside comode, Tub/shower  bench, SLP-  Equipment Details: PT- , OT-  Patient/family involved in discharge planning:  PT- Patient,  OT-Patient, SLP-   MD ELOS: 10-14 days Medical Rehab Prognosis:  Fair Assessment: Pt is a 79 yr old female with cervical myelopathy due to cervical stenosis s/p decompression/fusion C3-6 Also has DM, BMI >40, and increased risk of DVT-   Goals mod I to supervision    See Team Conference Notes for weekly updates to the plan of care

## 2019-09-23 NOTE — Progress Notes (Signed)
Physical Therapy Session Note  Patient Details  Name: Doris Jackson MRN: [id number] Date of Birth: 1941/06/03  Today's Date: 09/23/2019 PT Individual Time: 1305-1400 PT Individual Time Calculation (min): 55 min   Short Term Goals: Week 1:  PT Short Term Goal 1 (Week 1): Pt will be able to perform basic transfers at supervision level PT Short Term Goal 2 (Week 1): Pt will be able to perform bed mobility with supervision PT Short Term Goal 3 (Week 1): Pt will be able to perform stairs with CGA PT Short Term Goal 4 (Week 1): Pt will be able to gait x 50' with CGA Week 2:    Week 3:     Skilled Therapeutic Interventions/Progress Updates:    PAIN 3/10 neck L side Treatment to toleranc4e, increased activity, distraction  Pt initially supine and agreeable to treatment session with focus on general strengthening and functional mobility.  In supine therapist applied HCC/readjusted for comfort. Supine to sit w/use of rails, cues, min assist.  Pt stated she was fatigued from full day, agreed to session w/rest as needed.   STS w/RW w/cga, gait 58ft to BR/commode tsf w/cga using RW, cues for safety.  Pt continent of urine.  Stands at commode and requires total assist for hygiene.  Raises pants and brief w/max assist., cga for balance w/RW Gait 29ft to wc w/cga w/rw, cues for safety, moves somewhat impulsively.  Pt transported to gym for continued session.    wc propulsion x 60ft w/bilat UE's , chooses alternating hands technique, instructed w/symmetrical propulsion for efficiency.    Spt to and from NuStep w/RW and cga, cues for safety. NuStep x 6 min L2 w/several brief rest breaks; performed for cardiovascular conditioning and general strength.  Repeated STS x 3 from wc for LE strengthening w/RW/cga. STS w/cga, gait 73ft wc to bed, turn/sit to bed w/cues for safety, cga.  Sit to supine w/min assist for LE management.   Pt left supine w/rails up x 3, alarm set, bed in lowest position, and needs  in reach.  Therapy Documentation Precautions:  Precautions Precautions: Cervical, Fall Precaution Booklet Issued: Yes (comment) Precaution Comments: reviewd cervial precautions with pt and husband with handout provided Required Braces or Orthoses: Cervical Brace Cervical Brace: Hard collar Restrictions Weight Bearing Restrictions: No    Therapy/Group: [place], PT  09/23/2019, 4:39 PM

## 2019-09-23 NOTE — Evaluation (Signed)
Physical Therapy Assessment and Plan  Patient Details  Name: Doris Jackson MRN: [id number] Date of Birth: 11-Mar-1941  PT Diagnosis: Difficulty walking, Impaired sensation, Muscle weakness, Pain in joint and Quadriplegia Rehab Potential: Good ELOS: 10-14 days   Today's Date: 09/23/2019 PT Individual Time: 1026-1130 PT Individual Time Calculation (min): 64 min    Problem List:  Patient Active Problem List   Diagnosis Date Noted  . Cervical myelopathy ([place]) 09/22/2019  . S/P cervical spinal fusion 09/22/2019  . 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 AKI (acute kidney injury) ([place]) 09/14/2019  . GERD (gastroesophageal reflux disease) 09/14/2019  . Spinal stenosis in cervical region 09/14/2019  . Weakness 09/14/2019  . Numbness and tingling 09/14/2019  . Cervical spinal stenosis 09/14/2019  . Lumbar stenosis 08/24/2019  . Obesity, morbid, BMI 40.0-49.9 ([place]) 04/19/2018  . Cataract   . Fibroid   . Endometriosis   . DM (diabetes mellitus) type II controlled with renal manifestation ([place]) 06/17/2010  . Dyslipidemia 06/17/2010  . Microcytic anemia 06/17/2010  . Essential hypertension 06/17/2010  . Allergic rhinitis 06/17/2010  . Osteoarthritis 06/17/2010  . LOW BACK PAIN 06/17/2010  . OSTEOPENIA 06/17/2010  . BREAST CANCER, HX OF 06/17/2010    Past Medical History:  Past Medical History:  Diagnosis Date  . ALLERGIC RHINITIS 06/17/2010  . ANEMIA-NOS 06/17/2010  . BREAST CANCER, HX OF 06/17/2010  . Cancer [facility]) 2008   right breast  . Cataract   . COPD 06/17/2010  . DIABETES MELLITUS, TYPE II 06/17/2010  . Endometriosis   . Fibroid    FIBROID  . HYPERLIPIDEMIA 06/17/2010  . HYPERTENSION 06/17/2010  . Leg swelling   . LOW BACK PAIN 06/17/2010  . OSTEOARTHRITIS 06/17/2010  . OSTEOPENIA 06/17/2010  . Weakness    Past Surgical History:  Past Surgical History:  Procedure Laterality Date  . ABDOMINAL HYSTERECTOMY  1988   TAH,LSO  .  ANTERIOR CERVICAL DECOMP/DISCECTOMY FUSION N/A 09/15/2019   Procedure: Cervical five CorpectomyANTERIOR CERVICAL DECOMPRESSION/DISCECTOMY FUSION CERVICAL THREE-CERVICAL Four;  Surgeon: Ashok Pall, MD;  Location: [facility];  Service: Neurosurgery;  Laterality: N/A;  . BREAST LUMPECTOMY  2007   LUMPECTOMY FOLLOWED BY RADIATION  . OOPHORECTOMY  1988   TAH,LSO  . TUBAL LIGATION      Assessment & Plan Clinical Impression: Patient is a 79 year old female with history of HTN, T2DM, microcytic anemia, recent evaluation in the ED revealing cord compression with severe spinal stenosis C3-C6 but refused admission therefore was discharged to home on steroids per NS.  She was admitted via ED on 09/14/2019 with worsening of numbness and tingling in bilateral hands and feet as well as balance deficits.  She was taken to the OR on 03/18 for ACDF C4/5 with C5 cervical corpectomy and arthrodesis C4-C6.  Hospital course significant for AKI due to prerenal azotemia--was treated with IV fluids for hydration as well as reports of throat discomfort and problem swallowing.  Speech therapy evaluated patient and recommended regular diet however patient preferred advance per her comfort level. Lantus added due to poorly controlled BS. ABLA with thrombocytopenia being monitored. Therapy ongoing and patient limited by pain. Patient transferred to CIR on 09/22/2019 .   Patient currently requires min to mod assist with mobility secondary to muscle weakness, decreased cardiorespiratoy endurance, impaired timing and sequencing and decreased coordination and decreased sitting balance, decreased standing balance, decreased postural control and decreased balance strategies.  Prior to hospitalization, patient was modified independent  with mobility and lived with  Spouse in a House home.  Home access is 1 step at back entrance without rails; 3-4 in frontStairs to enter.  Patient will benefit from skilled PT intervention to maximize safe  functional mobility, minimize fall risk and decrease caregiver burden for planned discharge home with intermittent assist.  Anticipate patient will benefit from follow up [facility] at discharge.  PT - End of Session Endurance Deficit: Yes PT Assessment Rehab Potential (ACUTE/IP ONLY): Good PT Barriers to Discharge: Decreased caregiver support PT Barriers to Discharge Comments: husband works and pt has intermittent support PT Patient demonstrates impairments in the following area(s): Balance;Behavior;Endurance;Motor;Nutrition;Pain;Sensory;Skin Integrity PT Transfers Functional Problem(s): Bed Mobility;Bed to Chair;Car;Furniture PT Locomotion Functional Problem(s): Wheelchair Mobility;Ambulation;Stairs PT Plan PT Intensity: Minimum of 1-2 x/day ,45 to 90 minutes PT Frequency: 5 out of 7 days PT Duration Estimated Length of Stay: 10-14 days PT Treatment/Interventions: Ambulation/gait training;Balance/vestibular training;Community reintegration;Discharge planning;Disease management/prevention;DME/adaptive equipment instruction;Functional mobility training;Neuromuscular re-education;Pain management;Patient/family education;Psychosocial support;Skin care/wound management;Splinting/orthotics;Stair training;Therapeutic Activities;Therapeutic Exercise;UE/LE Strength taining/ROM;UE/LE Coordination activities;Wheelchair propulsion/positioning PT Transfers Anticipated Outcome(s): mod I basic transfers; supervision car PT Locomotion Anticipated Outcome(s): mod I household gait distances; supervision stairs PT Recommendation Recommendations for Other Services: Therapeutic Recreation consult Therapeutic Recreation Interventions: Stress management Follow Up Recommendations: Home health PT Patient destination: Home Equipment Recommended: Rolling walker with 5" wheels  Skilled Therapeutic Intervention Evaluation completed (see details above and below) with education on PT POC and goals and individual treatment  initiated with focus on introduction of RW for functional transfers and gait training, car transfer, and education in regards to goals and progress. Pt progresses to min assist level overall with RW with cues for hand placement and technique and facilitation for weightshift. Pt able to gait short distances with min assist with RW, limited due to generalized fatigue. Initial part of session pt performed functional gait with RW in and out bathroom with overall min assist and cues for slower pace and safety. Mod assist needed for toileting to manage clothing and hygiene. Pt expresses feeling overall anxious and requires encouragement throughout session and able to calm down and reports feeling comfortable with the therapists. Open and receptive to all feedback provided.    PT Evaluation Precautions/Restrictions Precautions Precautions: Cervical;Fall Required Braces or Orthoses: Cervical Brace Cervical Brace: Hard collar Restrictions Weight Bearing Restrictions: No General   Vital Signs Pain Pain Assessment Pain Scale: 0-10 Pain Score: 1  Pain Location: Neck Pain Orientation: Left Pain Descriptors / Indicators: Aching Pain Frequency: Intermittent Pain Onset: On-going Patients Stated Pain Goal: 0 Pain Intervention(s): Medication (See eMAR) Home Living/Prior Functioning Home Living Available Help at Discharge: Family;Available PRN/intermittently Type of Home: House Home Access: Stairs to enter [employer] of Steps: 1 step at back entrance without rails; 3-4 in front Entrance Stairs-Rails: None Home Layout: One level Bathroom Accessibility: Yes Additional Comments: Was using rollator prior to admission, shower chair  Lives With: Spouse Prior Function Level of Independence: Independent with basic ADLs;Independent with transfers;Independent with gait(prior to january)  Able to Take Stairs?: Yes Driving: Yes Comments: pt was I until approx 3 weeks ago. Husband has been  assisting with ADL activity; used rollator for mobility but independent Vision/Perception  Perception Perception: Within Functional Limits Praxis Praxis: Intact  Cognition Overall Cognitive Status: Within Functional Limits for tasks assessed Orientation Level: Oriented X4 Safety/Judgment: Appears intact Sensation Sensation Light Touch: Impaired Detail Light Touch Impaired Details: Impaired RLE;Impaired LLE;Impaired RUE;Impaired LUE(reports numbness and tingling and diminished;) Proprioception: Impaired by gross assessment Coordination Gross Motor Movements are Fluid and Coordinated: No  Fine Motor Movements are Fluid and Coordinated: No Finger Nose Finger Test: pt nearly poking eye with LUE Motor  Motor Motor: Tetraplegia;Abnormal postural alignment and control Motor - Skilled Clinical Observations: weakness greater in L than R Motor - Discharge Observations: generalized weakness  Mobility Bed Mobility Bed Mobility: Rolling Left;Supine to Sit;Sit to Supine Rolling Left: Minimal Assistance - Patient > 75% Supine to Sit: Minimal Assistance - Patient > 75% Sit to Supine: Contact Guard/Touching assist Transfers Transfers: Sit to Stand;Stand to Sit;Stand Pivot Transfers Sit to Stand: Minimal Assistance - Patient > 75% Stand to Sit: Minimal Assistance - Patient > 75% Stand Pivot Transfers: Moderate Assistance - Patient 50 - 74%(no AD) Stand Pivot Transfer Details: Verbal cues for safe use of DME/AE;Verbal cues for gait pattern;Verbal cues for precautions/safety Transfer (Assistive device): Rolling walker Locomotion  Gait Gait Distance (Feet): 5 Feet Assistive device: None Gait Gait Pattern: Impaired Stairs / Additional Locomotion Stairs: Yes Stairs Assistance: Minimal Assistance - Patient > 75% Stair Management Technique: Two rails;Step to pattern Number of Stairs: 1 Height of Stairs: 6  Trunk/Postural Assessment  Cervical Assessment Cervical Assessment: (C  collar) Thoracic Assessment Thoracic Assessment: Exceptions to WFL(rounded shoulders) Lumbar Assessment Lumbar Assessment: Exceptions to WFL(post pelvic tilt) Postural Control Postural Control: Deficits on evaluation  Balance Balance Balance Assessed: Yes Static Sitting Balance Static Sitting - Level of Assistance: 5: Stand by assistance Dynamic Sitting Balance Dynamic Sitting - Level of Assistance: 4: Min assist Static Standing Balance Static Standing - Level of Assistance: 4: Min assist Dynamic Standing Balance Dynamic Standing - Level of Assistance: 3: Mod assist Extremity Assessment  RUE Assessment RUE Assessment: Exceptions to [facility] General Strength Comments: AROM 0-80, full elbow/digit-3/5 LUE Assessment LUE Assessment: Exceptions to [facility] General Strength Comments: shoulder AROM 0-45, full elbow/digit-2-/5 RLE Assessment RLE Assessment: Exceptions to [facility] General Strength Comments: grossly 4/5 LLE Assessment LLE Assessment: Exceptions to [facility] General Strength Comments: grossly 4-/5 except 3+/5 hip flexion    Refer to Care Plan for Long Term Goals  Recommendations for other services: Neuropsych and Therapeutic Recreation  Stress management  Discharge Criteria: Patient will be discharged from PT if patient refuses treatment 3 consecutive times without medical reason, if treatment goals not met, if there is a change in medical status, if patient makes no progress towards goals or if patient is discharged from hospital.  The above assessment, treatment plan, treatment alternatives and goals were discussed and mutually agreed upon: by patient  Juanna Cao, PT, DPT, CBIS  09/23/2019, 12:22 PM

## 2019-09-23 NOTE — Progress Notes (Signed)
Inpatient Rehabilitation  Patient information reviewed and entered into eRehab system by Ko Bardon M. Dashaun Onstott, M.A., CCC/SLP, PPS Coordinator.  Information including medical coding, functional ability and quality indicators will be reviewed and updated through discharge.    

## 2019-09-23 NOTE — Progress Notes (Signed)
[facility] PHYSICAL MEDICINE & REHABILITATION PROGRESS NOTE   Subjective/Complaints:  Pt reports needs her meds- asked nursing to bring.  Hurts from her L side of neck on down to leg- 5/10- oxycodone is real strong- wants to back off some to something else.    ROS:  Pt denies SOB, abd pain, CP, N/V/C/D, and vision changes  Objective:   No results found. Recent Labs    09/23/19 0603  WBC 8.6  HGB 9.6*  HCT 29.3*  PLT 162   Recent Labs    09/23/19 0603  NA 141  K 4.2  CL 102  CO2 29  GLUCOSE 102*  BUN 27*  CREATININE 1.18*  CALCIUM 8.8*    Intake/Output Summary (Last 24 hours) at 09/23/2019 1714 Last data filed at 09/23/2019 1300 Gross per 24 hour  Intake 720 ml  Output -  Net 720 ml     Physical Exam: Vital Signs Blood pressure (!) 98/56, pulse 98, temperature (!) 97.4 F (36.3 C), resp. rate 18, weight 95.1 kg, SpO2 100 %.  Constitutional: pt awake, alert, sitting up in bed; asking for meds, NAD HENT:  Wearing hard cervical collar- looks good/incision  Cardiovascular: RRR Respiratory: CTA b/L- no resp distress  GI: soft, NT< ND< (+)BS  Musculoskeletal:        General: 1+ pedal edema present.      Comments: RUE- biceps 4-/5, triceps 4-/5, WE 4/5, grip 4/5, finger abd 4-/5 LUE- same except finger abd 3+/5- otherwise as above  RLE- HF 3-/5, KE 4+/5, DF 4/5, PF 4+/5, EHL 4+/5 LLE- HF 2/5, KE 4/5, DF 4-/5, PF 4/5, EHL 4-/5  Neurological: Ox3; no cranial nerve deficits Sensation decreased from C6 to T1, B/L but better on RUE than LUE On torso T2-T12 "normal" per pt Decreased in B/L LEs RLE>>>LLE No hoffman's; no clonus Skin: Skin is warm and dry.  Psychiatric: appropriate   Assessment/Plan: 1. Functional deficits secondary to cervical myelopathy due to severe cervical stenosis- cord compression- nontraumatic- s/p C3-C6 anterior decompression and arthrodesis s/p cage which require 3+ hours per day of interdisciplinary therapy in a comprehensive  inpatient rehab setting.  Physiatrist is providing close team supervision and 24 hour management of active medical problems listed below.  Physiatrist and rehab team continue to assess barriers to discharge/monitor patient progress toward functional and medical goals  Care Tool:  Bathing    Body parts bathed by patient: Left arm, Chest, Abdomen, Front perineal area, Buttocks, Right upper leg, Left upper leg, Face   Body parts bathed by helper: Right lower leg, Left lower leg, Right arm     Bathing assist Assist Level: Moderate Assistance - Patient 50 - 74%     Upper Body Dressing/Undressing Upper body dressing   What is the patient wearing?: Pull over shirt    Upper body assist Assist Level: Moderate Assistance - Patient 50 - 74%    Lower Body Dressing/Undressing Lower body dressing      What is the patient wearing?: Pants     Lower body assist Assist for lower body dressing: Maximal Assistance - Patient 25 - 49%     Toileting Toileting    Toileting assist Assist for toileting: Moderate Assistance - Patient 50 - 74%     Transfers Chair/bed transfer  Transfers assist     Chair/bed transfer assist level: Moderate Assistance - Patient 50 - 74%     Locomotion Ambulation   Ambulation assist      Assist level: Moderate Assistance -  Patient 50 - 74% Assistive device: No Device Max distance: 5'   Walk 10 feet activity   Assist  Walk 10 feet activity did not occur: Safety/medical concerns(unable without AD; min assist with RW)        Walk 50 feet activity   Assist Walk 50 feet with 2 turns activity did not occur: Safety/medical concerns         Walk 150 feet activity   Assist Walk 150 feet activity did not occur: Safety/medical concerns         Walk 10 feet on uneven surface  activity   Assist Walk 10 feet on uneven surfaces activity did not occur: Safety/medical concerns         Wheelchair     Assist Will patient use  wheelchair at discharge?: No Type of Wheelchair: Manual    Wheelchair assist level: Supervision/Verbal cueing Max wheelchair distance: 25    Wheelchair 50 feet with 2 turns activity    Assist    Wheelchair 50 feet with 2 turns activity did not occur: Safety/medical concerns       Wheelchair 150 feet activity     Assist  Wheelchair 150 feet activity did not occur: Safety/medical concerns       Blood pressure (!) 98/56, pulse 98, temperature (!) 97.4 F (36.3 C), resp. rate 18, weight 95.1 kg, SpO2 100 %.  Medical Problem List and Plan: 1.  Impaired Function, ADLs, mobility secondary to cervical myelopathy due to severe cervical stenosis- cord compression- nontraumatic- s/p C3-C6 anterior decompression and arthrodesis s/p cage             -patient may  Shower with another collar             -ELOS/Goals: 2-3 weeks; mod I to supervision possibly 2.  Antithrombotics: -DVT/anticoagulation:  Mechanical: Sequential compression devices, below knee Bilateral lower extremities              -will need to check to see if can prescribe Lovenox due to increased risk of DVT/PE             -antiplatelet therapy: N/A 3. Pain Management: Continues to be a limiting factor.             -pt reports well controlled- explained that's not a reason to not do therapy- need to let me know if needs more meds.    3/26- changed oxycodone ot Norco and tylenol prn with robaxin 4. Mood: LCSW to follow for evaluation and support.              -antipsychotic agents: N/a 5. Neuropsych: This patient is capable of making decisions on her own behalf. 6. Skin/Wound Care: Routine pressure relief measures.  7. Fluids/Electrolytes/Nutrition: Monitor I/O. Check lytes in am.  8. GERD/Dysphagia: On dysphagia 1, thin liquids per patient preference--to advance at her comfort level. Pt reports on puree due to problems swallowing.  9.T2DM: Hgb A1c- 7.0. Poorly controlled due to recent steriods. Will monitor BS ac/hs.  Continue Glucotrol and actos. Will change Glucerna to Ensure max and add CM restrictions to diet. Will d/c lantus--not home med and resume [place].   Add meal coverage tid.    CBG (last 3)  Recent Labs    09/23/19 0610 09/23/19 1154 09/23/19 1635  GLUCAP 120* 143* 155*   3/26- BGs well controlled- con't meds 10. HTN: Monitor BP tid. Continue Lotension/Norvasc and metoprolol.  11.  ABLA: Recheck CBC in am. Continue iron supplement.  3/26- Hb 9.6-  stable- con't monitor  12. Thrombocytopenia: Monitor for signs of bleeding. Recheck in am.   3/26- up top 162k- monitor 13. Leucocytosis: Monitor for signs of infection.   3/26- WBC down to 8.6- con't to monitor 14. Pre-renal azotemia in setting of AKI on CKD Stage III: Encourage fluid intake. Recheck BMET in am.   3/26- Cr 1.18- stable- BUN better 15. Morbid Obesity- BMI >40- discuss weight loss in the long term-      LOS: 1 days A FACE TO FACE EVALUATION WAS PERFORMED  Doris Jackson 09/23/2019, 5:14 PM

## 2019-09-23 NOTE — Evaluation (Signed)
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 Cervical myelopathy ([place]) 09/22/2019  . S/P cervical spinal fusion 09/22/2019  . 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 AKI (acute kidney injury) ([place]) 09/14/2019  . GERD (gastroesophageal reflux disease) 09/14/2019  . Spinal stenosis in cervical region 09/14/2019  . Weakness 09/14/2019  . Numbness and tingling 09/14/2019  . Cervical spinal stenosis 09/14/2019  . Lumbar stenosis 08/24/2019  . Obesity, morbid, BMI 40.0-49.9 ([place]) 04/19/2018  . Cataract   . Fibroid   . Endometriosis   . DM (diabetes mellitus) type II controlled with renal manifestation ([place]) 06/17/2010  . Dyslipidemia 06/17/2010  . Microcytic anemia 06/17/2010  . Essential hypertension 06/17/2010  . Allergic rhinitis 06/17/2010  . Osteoarthritis 06/17/2010  . LOW BACK PAIN 06/17/2010  . OSTEOPENIA 06/17/2010  . BREAST CANCER, HX OF 06/17/2010    Past Medical History:  Past Medical History:  Diagnosis Date  . ALLERGIC RHINITIS 06/17/2010  . ANEMIA-NOS 06/17/2010  . BREAST CANCER, HX OF 06/17/2010  . [facility]) 2008   right breast  . Cataract   . COPD 06/17/2010  . DIABETES MELLITUS, TYPE II 06/17/2010  . Endometriosis   . Fibroid    FIBROID  . HYPERLIPIDEMIA 06/17/2010  . HYPERTENSION 06/17/2010  . Leg swelling   . LOW BACK PAIN 06/17/2010  . OSTEOARTHRITIS 06/17/2010  . OSTEOPENIA 06/17/2010  . Weakness    Past Surgical History:  Past Surgical History:  Procedure Laterality Date  .  ABDOMINAL HYSTERECTOMY  1988   TAH,LSO  . ANTERIOR CERVICAL DECOMP/DISCECTOMY FUSION N/A 09/15/2019   Procedure: Cervical five CorpectomyANTERIOR CERVICAL DECOMPRESSION/DISCECTOMY FUSION CERVICAL THREE-CERVICAL Four;  Surgeon: Ashok Pall, MD;  Location: [address];  Service: Neurosurgery;  Laterality: N/A;  . BREAST LUMPECTOMY  2007   LUMPECTOMY FOLLOWED BY RADIATION  . OOPHORECTOMY  1988   TAH,LSO  . TUBAL LIGATION      Assessment & Plan Clinical Impression:  Doris Jackson is a 79 y.o. female with medical history significant of HTN, hyperlipidemia, DM, seasonal allergies, left eye corneal transplant, microcytic anemia, presents to emergency department due to worsening of numbness tingling sensation in both hands and feet. MRI brain and cervical spine was obtained which showed cord compression due to severe spinal stenosis. S/p Anterior Cervical decompression C3-4 (3/18)  Patient currently requires mod with basic self-care skills secondary to muscle weakness, decreased cardiorespiratoy endurance, impaired timing and sequencing, unbalanced muscle activation and decreased coordination and decreased sitting balance, decreased standing balance, decreased postural control, decreased balance strategies and difficulty maintaining precautions.  Prior to hospitalization, patient could complete BADL/IAD with modified independent .  Patient will benefit from skilled intervention to decrease level of assist with basic self-care skills and increase independence with basic self-care skills prior to discharge home with care partner.  Anticipate patient will require 24 hour supervision and follow up home health.  OT - End of Session Activity Tolerance: Tolerates 30+ min activity with multiple rests Endurance Deficit: Yes OT Assessment Rehab Potential (ACUTE ONLY): Good OT Barriers to Discharge: Weight OT Patient demonstrates impairments in the following  area(s):  Balance;Endurance;Motor;Nutrition;Pain;Perception;Safety;Sensory OT Basic ADL's Functional Problem(s): Grooming;Bathing;Dressing;Toileting OT Transfers Functional Problem(s): Toilet;Tub/Shower OT Additional Impairment(s): Fuctional Use of Upper Extremity OT Plan OT Intensity: Minimum of 1-2 x/day, 45 to 90 minutes OT Frequency: 5 out of 7 days OT Duration/Estimated Length of Stay: 10-12 OT Treatment/Interventions: Balance/vestibular training;Discharge planning;Pain management;Self Care/advanced ADL retraining;Therapeutic Activities;UE/LE Coordination activities;Therapeutic Exercise;Skin care/wound managment;Patient/family education;Functional mobility training;Disease mangement/prevention;Community reintegration;DME/adaptive equipment instruction;Neuromuscular re-education;Psychosocial support;Splinting/orthotics;UE/LE Strength taining/ROM;Wheelchair propulsion/positioning OT Self Feeding Anticipated Outcome(s): MOD I OT Basic Self-Care Anticipated Outcome(s): S OT Toileting Anticipated Outcome(s): S OT Bathroom Transfers Anticipated Outcome(s): S OT Recommendation Patient destination: Home Follow Up Recommendations: Home health OT Equipment Recommended: 3 in 1 bedside comode;Tub/shower bench   Skilled Therapeutic Intervention 1:1. Pt received in bed agreeable to OT after edu re OT role/purpose, CIR, ELOS and POC. Pt requesting pain medication prior to mobility and RN alerted. Edu re SCI rehab/progression/anatomy and recovery process while awaiting medicaiont. Pt requires VC for cervical precautions and dons C collar EOB. Pt completes box and blocks assessement with RUE 32 blocks and LUE 17 blocks. Pt supine>sitting EOB reporting dizziness that subsides with time and supervision. Pt ambulates to bathroom with MIN A for steadying and VC for RW mangement/safe reach back. Pt completes bathing at sink with A for B feet and steady A to wash buttocks/peri area. Pt requires MOD A to don shirt and MAX A  to don pants. Dependent for socks. Exited session with pt setaed in recliner, exit alrm on and call light inr each.  OT Evaluation Precautions/Restrictions  Precautions Precautions: Cervical;Fall Precaution Booklet Issued: Yes (comment) Precaution Comments: reviewd cervial precautions with pt and husband with handout provided Required Braces or Orthoses: Cervical Brace Cervical Brace: Hard collar Restrictions Weight Bearing Restrictions: No General Chart Reviewed: Yes Family/Caregiver Present: No Vital Signs  Pain Pain Assessment Pain Score: 5  Pain Location: Neck Pain Orientation: Left Home Living/Prior Functioning Home Living Available Help at Discharge: Family Type of Home: House Home Access: Stairs to enter Technical brewer of Steps: 1 step Entrance Stairs-Rails: Can reach both Home Layout: One level Bathroom Shower/Tub: Tub/shower unit, Primary school teacher: Yes Additional Comments: Was using rollator prior to admission, shower chair  Lives With: Spouse IADL History Homemaking Responsibilities: Yes Meal Prep Responsibility: Secondary(takes care of breakfast) Laundry Responsibility: Secondary Cleaning Responsibility: Secondary Bill Paying/Finance Responsibility: Primary Shopping Responsibility: No Child Care Responsibility: Primary Current License: Yes Mode of Transportation: Car Prior Function Level of Independence: Independent with basic ADLs, Independent with homemaking with ambulation, Needs assistance with homemaking Driving: Yes Comments: pt was I until approx 3 weeks ago. Husband has been assisting with ADL activity ADL   Vision Baseline Vision/History: (corneal transplant; L eye issues PTA) Patient Visual Report: No change from baseline Vision Assessment?: No apparent visual deficits Perception  Perception: Within Functional Limits Praxis Praxis: Intact Cognition Overall Cognitive Status: Within Functional Limits for tasks  assessed Arousal/Alertness: Awake/alert Orientation Level: Person;Place;Situation Person: Oriented Place: Oriented Situation: Oriented Year: 2021 Month: March Day of Week: Correct Memory: Appears intact Immediate Memory Recall: Sock;Blue;Bed Memory Recall Sock: Without Cue Memory Recall Blue: Without Cue Memory Recall Bed: Without Cue Problem Solving: Appears intact Sensation Sensation Light Touch: Appears Intact Proprioception: Impaired by gross assessment Coordination Gross Motor Movements are Fluid and Coordinated: No Fine Motor Movements are Fluid and Coordinated: No Finger Nose Finger Test: pt nearly poking eye with LUE Motor  Motor Motor: Tetraplegia Motor - Discharge Observations: generalized weakness Mobility  Transfers Sit to Stand: Minimal Assistance - Patient >  75% Stand to Sit: Minimal Assistance - Patient > 75%  Trunk/Postural Assessment  Cervical Assessment Cervical Assessment: (C collar) Thoracic Assessment Thoracic Assessment: Exceptions to WFL(rounded shoulders) Lumbar Assessment Lumbar Assessment: Exceptions to WFL(post pelvic tilt) Postural Control Postural Control: Deficits on evaluation(delayed)  Balance   S sitting balance MIN A static standing balnace Extremity/Trunk Assessment RUE Assessment RUE Assessment: Exceptions to [facility] General Strength Comments: AROM 0-80, full elbow/digit-3/5 LUE Assessment LUE Assessment: Exceptions to [facility] General Strength Comments: shoulder AROM 0-45, full elbow/digit-2-/5     Refer to Care Plan for Long Term Goals  Recommendations for other services: None    Discharge Criteria: Patient will be discharged from OT if patient refuses treatment 3 consecutive times without medical reason, if treatment goals not met, if there is a change in medical status, if patient makes no progress towards goals or if patient is discharged from hospital.  The above assessment, treatment plan, treatment alternatives and goals  were discussed and mutually agreed upon: by patient  Tonny Branch 09/23/2019, 9:13 AM

## 2019-09-23 NOTE — Progress Notes (Signed)
Initial Nutrition Assessment  DOCUMENTATION CODES:   Obesity unspecified  INTERVENTION:   D/c Ensure Max, does not provide enough calories  Ensure Enlive po BID, each supplement provides 350 kcal and 20 grams of protein  Magic cup TID with meals, each supplement provides 290 kcal and 9 grams of protein  MVI daily   NUTRITION DIAGNOSIS:   Increased nutrient needs related to other (see comment)(therapies) as evidenced by estimated needs.   GOAL:   Patient will meet greater than or equal to 90% of their needs   MONITOR:   PO intake, Supplement acceptance, Skin, Weight trends, Labs, Diet advancement, I & O's  REASON FOR ASSESSMENT:   Malnutrition Screening Tool    ASSESSMENT:   Pt with a PMH significant for DM, HTN, HLD who presented to the ED with worsening numbness and tingling in her extremities and was found to have spinal cord compression due to severe spinal stenosis. Pt underwent surgical decompression on 3/18. Pt admitted to [facility] 3/25.  Pt reports poor appetite since January 2021. For breakfast, pt has cereal and a banana. For lunch, she has soup. For dinner, she often has chicken/fish with vegetables and occasionally potatoes. Pt agreeable to using Ensure while admitted.   Pt with a 6% wt loss x1 month, which is significant for time frame.   No Po intake documented.   Medications reviewed and include: Vitamin D3, Glucotrol, SSI, Novolog, Tradjenta, Ensure Max BID, Senokot-S  Labs reviewed: CBGs 120-202  NUTRITION - FOCUSED PHYSICAL EXAM:    Most Recent Value  Orbital Region  No depletion  Upper Arm Region  No depletion  Thoracic and Lumbar Region  No depletion  Buccal Region  No depletion  Temple Region  No depletion  Clavicle Bone Region  No depletion  Clavicle and Acromion Bone Region  No depletion  Scapular Bone Region  No depletion  Dorsal Hand  No depletion  Patellar Region  No depletion  Anterior Thigh Region  No depletion  Posterior Calf  Region  No depletion  Edema (RD Assessment)  None  Hair  Reviewed  Eyes  Reviewed  Mouth  Reviewed  Skin  Reviewed  Nails  Reviewed       Diet Order:   Diet Order            DIET - DYS 1 Room service appropriate? Yes; Fluid consistency: Thin  Diet effective now              EDUCATION NEEDS:   No education needs have been identified at this time  Skin:  Skin Assessment: Skin Integrity Issues: Skin Integrity Issues:: Incisions Incisions: neck  Last BM:  3/24  Height:   Ht Readings from Last 1 Encounters:  09/15/19 5' 2.01" (1.575 m)    Weight:   Wt Readings from Last 1 Encounters:  09/23/19 95.1 kg    BMI:  Body mass index is 38.34 kg/m.  Estimated Nutritional Needs:   Kcal:  1750-2000  Protein:  95-105 grams  Fluid:  >/= 1.8 L/d    Larkin Ina, MS, RD, LDN RD pager number and weekend/on-call pager number located in [place].

## 2019-09-24 ENCOUNTER — Inpatient Hospital Stay (HOSPITAL_COMMUNITY): Payer: Medicare HMO

## 2019-09-24 ENCOUNTER — Inpatient Hospital Stay (HOSPITAL_COMMUNITY): Payer: Medicare HMO | Admitting: Physical Therapy

## 2019-09-24 DIAGNOSIS — G8918 Other acute postprocedural pain: Secondary | ICD-10-CM

## 2019-09-24 DIAGNOSIS — Z981 Arthrodesis status: Secondary | ICD-10-CM

## 2019-09-24 DIAGNOSIS — N179 Acute kidney failure, unspecified: Secondary | ICD-10-CM

## 2019-09-24 DIAGNOSIS — R7309 Other abnormal glucose: Secondary | ICD-10-CM

## 2019-09-24 DIAGNOSIS — E1129 Type 2 diabetes mellitus with other diabetic kidney complication: Secondary | ICD-10-CM

## 2019-09-24 DIAGNOSIS — D62 Acute posthemorrhagic anemia: Secondary | ICD-10-CM

## 2019-09-24 DIAGNOSIS — R1310 Dysphagia, unspecified: Secondary | ICD-10-CM

## 2019-09-24 LAB — GLUCOSE, CAPILLARY
Glucose-Capillary: 110 mg/dL — ABNORMAL HIGH (ref 70–99)
Glucose-Capillary: 199 mg/dL — ABNORMAL HIGH (ref 70–99)
Glucose-Capillary: 170 mg/dL — ABNORMAL HIGH (ref 70–99)
Glucose-Capillary: 167 mg/dL — ABNORMAL HIGH (ref 70–99)

## 2019-09-24 NOTE — Progress Notes (Signed)
Occupational Therapy Session Note  Patient Details  Name: Doris Jackson MRN: [id number] Date of Birth: 1941-05-01  Today's Date: 09/24/2019 OT Individual Time: 0900-1000 OT Individual Time Calculation (min): 60 min    Short Term Goals: Week 1:  OT Short Term Goal 1 (Week 1): Pt will complete toilet transfer wiht CGA OT Short Term Goal 2 (Week 1): Pt will thread BLE into pants wiht AE PRN OT Short Term Goal 3 (Week 1): Pt will don B socks wiht AE PRN OT Short Term Goal 4 (Week 1): Pt will require no more than min VC for cervical precauitons during ADLs OT Short Term Goal 5 (Week 1): Pt will groom in standing to demo improved endruance wiht CGA  Skilled Therapeutic Interventions/Progress Updates:  Pt received supine in bed agreeable to OT intervention. Pt completed bed mobility with CGA; cues to incorporate log roll technique. Donned cervical EOB with pt able to verbalize cervical precautions, however required cues to incorporate precautions functionally into ADLs. Pt requires MIN A for stand pivot transfer to [facility] with RW; total A for pericare. Pt completed UB dressing with MINA, total - MAX A for LB dressing. Pt transported to day room in w/c with total A for time mgmt. Pt completed standing therapeutic activity to facilitate increased standing tolerance for ADL participation and [facility]. Pt able to stand ~ 3 mins needing a seated rest break halfway through. Pt instructed to complete peg board activity with LUE while standing. Pt with noted LUE [facility] deficits needing increased time and effort to manipulate pegs with LUE. Pt transported back to room in similar fashion as previously indicated. Pt left seated in w/c with alarm belt on and all needs within reach.   Therapy Documentation Precautions:  Precautions Precautions: Cervical, Fall Precaution Booklet Issued: Yes (comment) Precaution Comments: reviewd cervial precautions with pt and husband with handout provided Required Braces or Orthoses:  Cervical Brace Cervical Brace: Hard collar Restrictions Weight Bearing Restrictions: No General:   Vital Signs:  Pain: Pt reports 2/10 pain during session with RN giving pain meds during session.   Therapy/Group: Individual Therapy  [name] 09/24/2019, 10:20 AM

## 2019-09-24 NOTE — Progress Notes (Signed)
Occupational Therapy Session Note  Patient Details  Name: Doris Jackson MRN: [id number] Date of Birth: 12-Sep-1940  Today's Date: 09/24/2019 OT Individual Time: 0100-0145 OT Individual Time Calculation (min): 45 min  and Today's Date: 09/24/2019 OT Missed Time: 15 Minutes Missed Time Reason: Patient fatigue;Other (comment)(Pt reports not feeling right requesting to return to bed- RN aware)   Short Term Goals: Week 1:  OT Short Term Goal 1 (Week 1): Pt will complete toilet transfer wiht CGA OT Short Term Goal 2 (Week 1): Pt will thread BLE into pants wiht AE PRN OT Short Term Goal 3 (Week 1): Pt will don B socks wiht AE PRN OT Short Term Goal 4 (Week 1): Pt will require no more than min VC for cervical precauitons during ADLs OT Short Term Goal 5 (Week 1): Pt will groom in standing to demo improved endruance wiht CGA  Skilled Therapeutic Interventions/Progress Updates: Pt received seated in w/c agreeable to OT intervention. Pt transported to ADL apartment in w/c for time mgmt. Attempted bed mobility practice on flat surface with pt stating feeling like she's falling off the side once seated EOB; pt appeared stable however terminated task for safety. Pt completed IADL task in kitchen where pt instructed to gather kitchen items from cabinet pt took ~ 3 steps to cabinet and able to reach for cups then reports immediatly needing to sit; pt reports feeling anxious and "not right." Pt transported back to room in w/c with total A.  Pt completed stand pivot transfer to EOB with RW and MIN A needing cues to sequence log roll technique back to bed. Pt completed supine LUE [place] therex via clothespin activity where pt instructed to use L hand to retrieve and manipulate clothespins to place on racks. Pt left supine in bed with all needs within reach and bed alarm activated. Missed 15 mins of skilled OT intervention d/t general malaise. RN aware of reports of malaise.   Therapy Documentation Precautions:   Precautions Precautions: Cervical, Fall Precaution Booklet Issued: Yes (comment) Precaution Comments: reviewd cervial precautions with pt and husband with handout provided Required Braces or Orthoses: Cervical Brace Cervical Brace: Hard collar Restrictions Weight Bearing Restrictions: No General: General OT Amount of Missed Time: 15 Minutes Vital Signs:  Pain:  Pt reports mild pain in L hand during clothespin activity with no intervention needed. Pt c/o general malaise during session; offered increased seated rest breaks and conducted last half of session from supine.   Therapy/Group: Individual Therapy  [name] 09/24/2019, 1:56 PM

## 2019-09-24 NOTE — Progress Notes (Signed)
Physical Therapy Session Note  Patient Details  Name: Doris Jackson MRN: [id number] Date of Birth: 08/09/1940  Today's Date: 09/24/2019 PT Individual Time: 1435-1445 PT Individual Time Calculation (min): 10 min  and Today's Date: 09/24/2019 PT Missed Time: 72 Minutes Missed Time Reason: Patient unwilling to participate  Short Term Goals: Week 1:  PT Short Term Goal 1 (Week 1): Pt will be able to perform basic transfers at supervision level PT Short Term Goal 2 (Week 1): Pt will be able to perform bed mobility with supervision PT Short Term Goal 3 (Week 1): Pt will be able to perform stairs with CGA PT Short Term Goal 4 (Week 1): Pt will be able to gait x 50' with CGA  Skilled Therapeutic Interventions/Progress Updates:   Pt in supine upon arrival, pt declining participation 2/2 not feeling well. Pt unable to elaborate specifically, but reports her BP was just checked and was low. Pt also reports dizziness throughout today and then later stated "well I thought I was done with therapy for today". Educated pt on importance of upright activity to work on OOB tolerance and discussed how low BP could contribute to dizziness. Pt verbalized understanding but continued to decline. Remained in supine, all needs in reach.   Therapy Documentation Precautions:  Precautions Precautions: Cervical, Fall Precaution Booklet Issued: Yes (comment) Precaution Comments: reviewd cervial precautions with pt and husband with handout provided Required Braces or Orthoses: Cervical Brace Cervical Brace: Hard collar Restrictions Weight Bearing Restrictions: No General: PT Amount of Missed Time (min): 50 Minutes PT Missed Treatment Reason: Patient unwilling to participate Vital Signs: Therapy Vitals Temp: (!) 97.4 F (36.3 C) Temp Source: Oral Pulse Rate: 87 Resp: 16 BP: 108/61 Patient Position (if appropriate): Lying Oxygen Therapy SpO2: 100 % O2 Device: Room Air  Therapy/Group: Individual  Therapy  [name] Clent Demark 09/24/2019, 3:03 PM

## 2019-09-24 NOTE — Progress Notes (Signed)
[name] PHYSICAL MEDICINE & REHABILITATION PROGRESS NOTE   Subjective/Complaints: Patient seen sitting up in bed this morning.  She states she slept fairly well overnight.  She states she believes narcotics may be causing hallucinations and states she is managing with Tylenol.  ROS: Denies CP, SOB, N/V/D  Objective:   No results found. Recent Labs    09/23/19 0603  WBC 8.6  HGB 9.6*  HCT 29.3*  PLT 162   Recent Labs    09/23/19 0603  NA 141  K 4.2  CL 102  CO2 29  GLUCOSE 102*  BUN 27*  CREATININE 1.18*  CALCIUM 8.8*    Intake/Output Summary (Last 24 hours) at 09/24/2019 1608 Last data filed at 09/23/2019 1849 Gross per 24 hour  Intake 240 ml  Output --  Net 240 ml     Physical Exam: Vital Signs Blood pressure 108/61, pulse 87, temperature (!) 97.4 F (36.3 C), temperature source Oral, resp. rate 16, height 5\' 2"  (1.575 m), weight 95.7 kg, SpO2 100 %. Constitutional: No distress . Vital signs reviewed. HENT: Normocephalic.  Atraumatic. Eyes: EOMI. No discharge. Cardiovascular: No JVD. Respiratory: Normal effort.  No stridor. GI: Non-distended. Skin: Vascular changes bilateral lower extremities Psych: Normal mood.  Normal behavior. Musc: Lower extremity edema. Neuro: Alert Motor: Grossly 4 -/5 throughout Neurological: Alert  Assessment/Plan: 1. 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  Physiatrist is providing close team supervision and 24 hour management of active medical problems listed below.  Physiatrist and rehab team continue to assess barriers to discharge/monitor patient progress toward functional and medical goals  Care Tool:  Bathing    Body parts bathed by patient: Left arm, Chest, Abdomen, Front  perineal area, Buttocks, Right upper leg, Left upper leg, Face   Body parts bathed by helper: Right lower leg, Left lower leg, Right arm     Bathing assist Assist Level: Moderate Assistance - Patient 50 - 74%     Upper Body Dressing/Undressing Upper body dressing   What is the patient wearing?: Pull over shirt    Upper body assist Assist Level: Minimal Assistance - Patient > 75%    Lower Body Dressing/Undressing Lower body dressing      What is the patient wearing?: Underwear/pull up, Pants     Lower body assist Assist for lower body dressing: Maximal Assistance - Patient 25 - 49%     Toileting Toileting    Toileting assist Assist for toileting: Maximal Assistance - Patient 25 - 49%     Transfers Chair/bed transfer  Transfers assist     Chair/bed transfer assist level: Minimal Assistance - Patient > 75%     Locomotion Ambulation   Ambulation assist      Assist level: Moderate Assistance - Patient 50 - 74% Assistive device: No Device Max distance: 5'   Walk 10 feet activity   Assist  Walk 10 feet activity did not occur: Safety/medical concerns(unable without AD; min assist with RW)        Walk 50 feet activity   Assist Walk 50 feet with 2 turns activity did not occur: Safety/medical concerns         Walk 150 feet activity   Assist Walk 150 feet activity did not occur: Safety/medical concerns         Walk 10 feet on  uneven surface  activity   Assist Walk 10 feet on uneven surfaces activity did not occur: Safety/medical concerns         Wheelchair     Assist Will patient use wheelchair at discharge?: No Type of Wheelchair: Manual    Wheelchair assist level: Supervision/Verbal cueing Max wheelchair distance: 25    Wheelchair 50 feet with 2 turns activity    Assist    Wheelchair 50 feet with 2 turns activity did not occur: Safety/medical concerns       Wheelchair 150 feet activity     Assist  Wheelchair  150 feet activity did not occur: Safety/medical concerns       Blood pressure 108/61, pulse 87, temperature (!) 97.4 F (36.3 C), temperature source Oral, resp. rate 16, height 5\' 2"  (1.575 m), weight 95.7 kg, SpO2 100 %.  Medical Problem List and Plan: 1.  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  Antithrombotics: -DVT/anticoagulation:  Mechanical: Sequential compression devices, below knee Bilateral lower extremities              -will need to check to see if can prescribe Lovenox due to increased risk of DVT/PE             -antiplatelet therapy: N/A 3. Pain Management: Continues to be a limiting factor.             3/26- changed oxycodone ot Norco and tylenol prn with robaxin   Relatively controlled with Tylenol on 3/27 4. Mood: LCSW to follow for evaluation and support.              -antipsychotic agents: N/a 5. Neuropsych: This patient is capable of making decisions on her own behalf. 6. Skin/Wound Care: Routine pressure relief measures.  7. Fluids/Electrolytes/Nutrition: Monitor I/O.  8. GERD/Dysphagia:   Advanced to D2 thins, continue advance as tolerated 9.T2DM: Hgb A1c- 7.0. Poorly controlled due to recent steriods. Will monitor BS ac/hs. Continue Glucotrol and actos.  Changed Glucerna to Ensure max and addED CM restrictions to diet. D/ced lantus--not home med and resume [place].   Added meal coverage tid.    CBG (last 3)  Recent Labs    09/23/19 2118 09/24/19 0620 09/24/19 1148  GLUCAP 89 110* 199*   Labile on 3/27, monitor for trend 10. HTN: Monitor BP tid. Continue Lotension/Norvasc and metoprolol.   Controlled on 3/27 11.  ABLA: Continue iron supplement.  Hemoglobin 9.6 on 3/26  Continue to monitor 12. Thrombocytopenia: Resolved  Monitor for signs of bleeding.  13. Leucocytosis: Resolved  Monitor  for signs of infection.  14. Pre-renal azotemia in setting of AKI on CKD Stage III: Encourage fluid intake.   Creatinine 1.18 on 3/26  Continue to monitor 15. Morbid Obesity- BMI >40- discuss weight loss in the long term-    LOS: 2 days A FACE TO FACE EVALUATION WAS PERFORMED  Doris Jackson Doris Jackson 09/24/2019, 4:08 PM

## 2019-09-25 DIAGNOSIS — F411 Generalized anxiety disorder: Secondary | ICD-10-CM

## 2019-09-25 DIAGNOSIS — F419 Anxiety disorder, unspecified: Secondary | ICD-10-CM

## 2019-09-25 LAB — GLUCOSE, CAPILLARY
Glucose-Capillary: 181 mg/dL — ABNORMAL HIGH (ref 70–99)
Glucose-Capillary: 123 mg/dL — ABNORMAL HIGH (ref 70–99)
Glucose-Capillary: 125 mg/dL — ABNORMAL HIGH (ref 70–99)
Glucose-Capillary: 98 mg/dL (ref 70–99)

## 2019-09-25 NOTE — Progress Notes (Signed)
[place] PHYSICAL MEDICINE & REHABILITATION PROGRESS NOTE   Subjective/Complaints: Patient seen sitting up in bed this morning.  She states she did not sleep well overnight due to tingling in her hands and her feet.  This is not new for her.  She, however does not want to increase her meds and states that she is going to try Tylenol.  She also notes that she feels better after she was able to stand up and ambulate in the room.  ROS: Denies CP, SOB, N/V/D  Objective:   No results found. Recent Labs    09/23/19 0603  WBC 8.6  HGB 9.6*  HCT 29.3*  PLT 162   Recent Labs    09/23/19 0603  NA 141  K 4.2  CL 102  CO2 29  GLUCOSE 102*  BUN 27*  CREATININE 1.18*  CALCIUM 8.8*    Intake/Output Summary (Last 24 hours) at 09/25/2019 1955 Last data filed at 09/25/2019 1905 Gross per 24 hour  Intake 360 ml  Output --  Net 360 ml     Physical Exam: Vital Signs Blood pressure 110/71, pulse 96, temperature 97.7 F (36.5 C), resp. rate 18, height 5\' 2"  (1.575 m), weight 96.8 kg, SpO2 95 %.  Constitutional: No distress . Vital signs reviewed. HENT: Normocephalic.  Atraumatic. Eyes: EOMI. No discharge. Cardiovascular: No JVD. Respiratory: Normal effort.  No stridor. GI: Non-distended. Skin: Vascular changes bilateral lower extremities Psych: Anxious. Musc: Lower extremity edema Neuro: Alert Motor: Grossly 4 -/5 throughout, unchanged  Assessment/Plan: 1. 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  Physiatrist is providing close team supervision and 24 hour management of active medical problems listed below.  Physiatrist and rehab team continue to assess barriers to discharge/monitor patient progress toward functional and medical goals  Care  Tool:  Bathing    Body parts bathed by patient: Left arm, Chest, Abdomen, Front perineal area, Buttocks, Right upper leg, Left upper leg, Face   Body parts bathed by helper: Right lower leg, Left lower leg, Right arm     Bathing assist Assist Level: Moderate Assistance - Patient 50 - 74%     Upper Body Dressing/Undressing Upper body dressing   What is the patient wearing?: Pull over shirt    Upper body assist Assist Level: Minimal Assistance - Patient > 75%    Lower Body Dressing/Undressing Lower body dressing      What is the patient wearing?: Incontinence brief     Lower body assist Assist for lower body dressing: Minimal Assistance - Patient > 75%     Toileting Toileting    Toileting assist Assist for toileting: Minimal Assistance - Patient > 75%     Transfers Chair/bed transfer  Transfers assist     Chair/bed transfer assist level: Minimal Assistance - Patient > 75%     Locomotion Ambulation   Ambulation assist      Assist level: Moderate Assistance - Patient 50 - 74% Assistive device: No Device Max distance: 5'   Walk 10 feet activity   Assist  Walk 10 feet activity did not occur: Safety/medical concerns(unable without AD; min assist with RW)        Walk 50 feet activity   Assist Walk 50 feet with 2 turns activity did not occur: Safety/medical concerns  Walk 150 feet activity   Assist Walk 150 feet activity did not occur: Safety/medical concerns         Walk 10 feet on uneven surface  activity   Assist Walk 10 feet on uneven surfaces activity did not occur: Safety/medical concerns         Wheelchair     Assist Will patient use wheelchair at discharge?: No Type of Wheelchair: Manual    Wheelchair assist level: Supervision/Verbal cueing Max wheelchair distance: 25    Wheelchair 50 feet with 2 turns activity    Assist    Wheelchair 50 feet with 2 turns activity did not occur: Safety/medical  concerns       Wheelchair 150 feet activity     Assist  Wheelchair 150 feet activity did not occur: Safety/medical concerns       Blood pressure 110/71, pulse 96, temperature 97.7 F (36.5 C), resp. rate 18, height 5\' 2"  (1.575 m), weight 96.8 kg, SpO2 95 %.  Medical Problem List and Plan: 1.  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  Antithrombotics: -DVT/anticoagulation:  Mechanical: Sequential compression devices, below knee Bilateral lower extremities              -will need to check to see if can prescribe Lovenox due to increased risk of DVT/PE             -antiplatelet therapy: N/A 3. Pain Management: Continues to be a limiting factor.             3/26- changed oxycodone ot Norco and tylenol prn with robaxin   Relatively controlled with Tylenol on 3/28, patient does not want escalation or change in medications  Anxiety component as well 4. Mood: LCSW to follow for evaluation and support.              -antipsychotic agents: N/a 5. Neuropsych: This patient is capable of making decisions on her own behalf.  Will consider Neuropsych consult due to anxiety 6. Skin/Wound Care: Routine pressure relief measures.  7. Fluids/Electrolytes/Nutrition: Monitor I/O.  8. GERD/Dysphagia:   Advanced to D2 thins, continue advance as tolerated 9.T2DM: Hgb A1c- 7.0.Will monitor BS ac/hs. Continue Glucotrol and actos.  Changed Glucerna to Ensure max and addED CM restrictions to diet. D/ced lantus--not home med and resume [place].   Added meal coverage tid.    CBG (last 3)  Recent Labs    09/25/19 0621 09/25/19 1201 09/25/19 1707  GLUCAP 181* 123* 125*   Labile on 3/28, but stabilizing, monitor for trend 10. HTN: Monitor BP tid. Continue Lotension/Norvasc and metoprolol.   Controlled on 3/28  11.  ABLA: Continue iron  supplement.  Hemoglobin 9.6 on 3/26  Continue to monitor 12. Thrombocytopenia: Resolved  Monitor for signs of bleeding.  13. Leucocytosis: Resolved  Monitor for signs of infection.  14. Pre-renal azotemia in setting of AKI on CKD Stage III: Encourage fluid intake.   Creatinine 1.18 on 3/26  Continue to monitor 15. Morbid Obesity- BMI >40- discuss weight loss in the long term-    LOS: 3 days A FACE TO FACE EVALUATION WAS PERFORMED  Ruther Ephraim Lorie Phenix 09/25/2019, 7:55 PM

## 2019-09-26 ENCOUNTER — Inpatient Hospital Stay (HOSPITAL_COMMUNITY): Payer: Medicare HMO | Admitting: Physical Therapy

## 2019-09-26 ENCOUNTER — Encounter (HOSPITAL_COMMUNITY): Payer: Self-pay | Admitting: Physical Medicine and Rehabilitation

## 2019-09-26 ENCOUNTER — Inpatient Hospital Stay (HOSPITAL_COMMUNITY): Payer: Medicare HMO | Admitting: Occupational Therapy

## 2019-09-26 ENCOUNTER — Inpatient Hospital Stay (HOSPITAL_COMMUNITY): Payer: Medicare HMO

## 2019-09-26 LAB — CBC
HCT: 28.4 % — ABNORMAL LOW (ref 36.0–46.0)
Hemoglobin: 9.3 g/dL — ABNORMAL LOW (ref 12.0–15.0)
MCH: 26.1 pg (ref 26.0–34.0)
MCHC: 32.7 g/dL (ref 30.0–36.0)
MCV: 79.8 fL — ABNORMAL LOW (ref 80.0–100.0)
Platelets: 183 10*3/uL (ref 150–400)
RBC: 3.56 MIL/uL — ABNORMAL LOW (ref 3.87–5.11)
RDW: 16.6 % — ABNORMAL HIGH (ref 11.5–15.5)
WBC: 7 10*3/uL (ref 4.0–10.5)
nRBC: 0 % (ref 0.0–0.2)

## 2019-09-26 LAB — BASIC METABOLIC PANEL
Anion gap: 12 (ref 5–15)
BUN: 34 mg/dL — ABNORMAL HIGH (ref 8–23)
CO2: 27 mmol/L (ref 22–32)
Calcium: 9.1 mg/dL (ref 8.9–10.3)
Chloride: 101 mmol/L (ref 98–111)
Creatinine, Ser: 1.5 mg/dL — ABNORMAL HIGH (ref 0.44–1.00)
GFR calc Af Amer: 39 mL/min — ABNORMAL LOW (ref >60)
GFR calc non Af Amer: 33 mL/min — ABNORMAL LOW (ref >60)
Glucose, Bld: 123 mg/dL — ABNORMAL HIGH (ref 70–99)
Potassium: 3.8 mmol/L (ref 3.5–5.1)
Sodium: 140 mmol/L (ref 135–145)

## 2019-09-26 LAB — GLUCOSE, CAPILLARY
Glucose-Capillary: 120 mg/dL — ABNORMAL HIGH (ref 70–99)
Glucose-Capillary: 127 mg/dL — ABNORMAL HIGH (ref 70–99)
Glucose-Capillary: 119 mg/dL — ABNORMAL HIGH (ref 70–99)

## 2019-09-26 NOTE — Progress Notes (Signed)
Occupational Therapy Session Note  Patient Details  Name: Doris Jackson MRN: [id number] Date of Birth: 24-Jun-1941  Today's Date: 09/26/2019 OT Individual Time: 1300-1325 OT Individual Time Calculation (min): 25 min    Short Term Goals: Week 1:  OT Short Term Goal 1 (Week 1): Pt will complete toilet transfer wiht CGA OT Short Term Goal 2 (Week 1): Pt will thread BLE into pants wiht AE PRN OT Short Term Goal 3 (Week 1): Pt will don B socks wiht AE PRN OT Short Term Goal 4 (Week 1): Pt will require no more than min VC for cervical precauitons during ADLs OT Short Term Goal 5 (Week 1): Pt will groom in standing to demo improved endruance wiht CGA  Skilled Therapeutic Interventions/Progress Updates:    Pt resting in w/c upon arrival.  Initial OT intervention with focus on LUE strengthening and [facility] tasks with yellow and red clothes pins. Pt uses gross grasp to place and removed clothes pins from edge of container. Pt requested to use toilet and amb with RW to bathroom with CGA.  Pt maintained standing balance with CGA while doffing pants. Pt remained on toilet with RN Erline Levine present.   Therapy Documentation Precautions:  Precautions Precautions: Cervical, Fall Precaution Booklet Issued: Yes (comment) Precaution Comments: reviewd cervial precautions with pt and husband with handout provided Required Braces or Orthoses: Cervical Brace Cervical Brace: Hard collar Restrictions Weight Bearing Restrictions: No   Pain:  Pt denies pain this afternoon   Therapy/Group: Individual Therapy  Leroy Libman 09/26/2019, 1:47 PM

## 2019-09-26 NOTE — Progress Notes (Signed)
Physical Therapy Session Note  Patient Details  Name: Doris Jackson MRN: [id number] Date of Birth: 12/04/1940  Today's Date: 09/26/2019 PT Individual Time: 0800-0900 PT Individual Time Calculation (min): 60 min   Short Term Goals: Week 1:  PT Short Term Goal 1 (Week 1): Pt will be able to perform basic transfers at supervision level PT Short Term Goal 2 (Week 1): Pt will be able to perform bed mobility with supervision PT Short Term Goal 3 (Week 1): Pt will be able to perform stairs with CGA PT Short Term Goal 4 (Week 1): Pt will be able to gait x 50' with CGA  Skilled Therapeutic Interventions/Progress Updates:    Pt received seated in bed receiving AM medications from RN. Pt reports minimal pain in her neck at rest, requests Tylenol from RN prior to beginning therapy session. Throughout session pt has increase in L upper trap pain with mobility, appears musculoskeletal in nature, provided hot pack at end of session for pain management. Bed mobility min A semi-reclined to sitting. Pt is max A to don hard collar while seated EOB. Provided wash cloth under collar for improved pt comfort. Sit to stand with CGA to RW. Ambulation x 10 ft with RW with CGA to w/c across patient room. Ambulation x 60 ft, x 40 ft with RW with min A for balance in hallway. Pt exhibits path deviation with gait with increase in deviation with onset of fatigue, v/c required for safety. Nustep level 2 x 3 min with use of B UE/LE for global endurance training before pt reports feeling too fatigued to continue. Pt requesting to return to bed at end of session, mod A for sit to supine for BLE management. Pt left semi-reclined in bed with needs in reach, bed alarm in place at end of session.  Therapy Documentation Precautions:  Precautions Precautions: Cervical, Fall Precaution Booklet Issued: Yes (comment) Precaution Comments: reviewd cervial precautions with pt and husband with handout provided Required Braces or Orthoses:  Cervical Brace Cervical Brace: Hard collar Restrictions Weight Bearing Restrictions: No    Therapy/Group: Individual Therapy   Excell Seltzer, PT, DPT  09/26/2019, 12:35 PM

## 2019-09-26 NOTE — Progress Notes (Signed)
Occupational Therapy Session Note  Patient Details  Name: Doris Jackson MRN: [id number] Date of Birth: [birth year]  Today's Date: 09/26/2019 45 minutes missed   Short Term Goals: Week 1:  OT Short Term Goal 1 (Week 1): Pt will complete toilet transfer wiht CGA OT Short Term Goal 2 (Week 1): Pt will thread BLE into pants wiht AE PRN OT Short Term Goal 3 (Week 1): Pt will don B socks wiht AE PRN OT Short Term Goal 4 (Week 1): Pt will require no more than min VC for cervical precauitons during ADLs OT Short Term Goal 5 (Week 1): Pt will groom in standing to demo improved endruance wiht CGA    Skilled Therapeutic Interventions/Progress Updates:    Pt greeted in bed, politely requesting to rest vs participate in tx. Offered to set pt up with bedlevel ADL activities or other therapeutic activities however pt continued to decline. Left her with all needs within reach and bed alarm set. Time missed due to refusal to participate.   Therapy Documentation Precautions:  Precautions Precautions: Cervical, Fall Precaution Booklet Issued: Yes (comment) Precaution Comments: reviewd cervial precautions with pt and husband with handout provided Required Braces or Orthoses: Cervical Brace Cervical Brace: Hard collar Restrictions Weight Bearing Restrictions: No Vital Signs: Therapy Vitals Temp: 97.9 F (36.6 C) Temp Source: Oral Pulse Rate: 98 Resp: 16 BP: 114/63 Patient Position (if appropriate): Lying Oxygen Therapy SpO2: 95 % O2 Device: Room Air ADL:       Therapy/Group: Individual Therapy  Suriya Kovarik A Cote Mayabb 09/26/2019, 7:27 AM

## 2019-09-26 NOTE — Care Management (Signed)
[place] Individual Statement of Services  Patient Name:  Doris Jackson  Date:  09/26/2019  Welcome to the [facility].  Our goal is to provide you with an individualized program based on your diagnosis and situation, designed to meet your specific needs.  With this comprehensive rehabilitation program, you will be expected to participate in at least 3 hours of rehabilitation therapies Monday-Friday, with modified therapy programming on the weekends.  Your rehabilitation program will include the following services:  Physical Therapy (PT), Occupational Therapy (OT), 24 hour per day rehabilitation nursing, Therapeutic Recreaction (TR), Psychology, Neuropsychology, Case Management (Social Worker), Rehabilitation Medicine, Nutrition Services, Pharmacy Services and Other  Weekly team conferences will be held on Tuesdays to discuss your progress.  Your Social Worker will talk with you frequently to get your input and to update you on team discussions.  Team conferences with you and your family in attendance may also be held.  Expected length of stay: 10-14 days   Overall anticipated outcome: Independent with Assistive Device  Depending on your progress and recovery, your program may change. Your Social Worker will coordinate services and will keep you informed of any changes. Your Social Worker's name and contact numbers are listed  below.  The following services may also be recommended but are not provided by the [facility] will be made to provide these services after discharge if needed.  Arrangements include referral to agencies that provide these services.  Your insurance has been verified to be:  [employer]  Your primary doctor is:  [name]  Pertinent information will be  shared with your doctor and your insurance company.  Social Worker:  Loralee Pacas, LCSWA  Information discussed with and copy given to patient by: Rana Snare, 09/26/2019, 4:50 PM

## 2019-09-26 NOTE — Progress Notes (Signed)
[place] PHYSICAL MEDICINE & REHABILITATION PROGRESS NOTE   Subjective/Complaints:  Pt reports bumped her L "shoulder" this weekend- was 8/10- was given percocet it hurt so bad. Sat night Didn't hurt rest of weekend until this AM.  Also, bowels- used to have runs- was going frequently- was on probiotics- not now- will see if needs.   ROS:  Pt denies SOB, abd pain, CP, N/V/C/D, and vision changes   Objective:   No results found. Recent Labs    09/26/19 0625  WBC 7.0  HGB 9.3*  HCT 28.4*  PLT 183   Recent Labs    09/26/19 0625  NA 140  K 3.8  CL 101  CO2 27  GLUCOSE 123*  BUN 34*  CREATININE 1.50*  CALCIUM 9.1    Intake/Output Summary (Last 24 hours) at 09/26/2019 1941 Last data filed at 09/26/2019 1851 Gross per 24 hour  Intake 12530 ml  Output --  Net 12530 ml     Physical Exam: Vital Signs Blood pressure 105/72, pulse 87, temperature 97.8 F (36.6 C), resp. rate 18, height 5\' 2"  (1.575 m), weight 95.4 kg, SpO2 100 %.  Gen: sitting on nustep with PT, obese, NAD CV: RRR- no JVD Pulm: CTA B/L- no W/R/R- good air movement GI: soft, NT, ND, (+)BS Skin: Vascular changes bilateral lower extremities Psych: Anxious. Musc: Lower extremity edema; L upper trap, levator, and scalenes on L real tight.  Neuro: Ox3 Motor: Grossly 4 -/5 throughout, unchanged  Assessment/Plan: 1. 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  Physiatrist is providing close team supervision and 24 hour management of active medical problems listed below.  Physiatrist and rehab team continue to assess barriers to discharge/monitor patient progress toward functional and medical goals  Care Tool:  Bathing    Body parts bathed by patient: Left arm, Chest,  Abdomen, Front perineal area, Buttocks, Right upper leg, Left upper leg, Face   Body parts bathed by helper: Right lower leg, Left lower leg, Right arm     Bathing assist Assist Level: Moderate Assistance - Patient 50 - 74%     Upper Body Dressing/Undressing Upper body dressing   What is the patient wearing?: Pull over shirt    Upper body assist Assist Level: Minimal Assistance - Patient > 75%    Lower Body Dressing/Undressing Lower body dressing      What is the patient wearing?: Incontinence brief     Lower body assist Assist for lower body dressing: Minimal Assistance - Patient > 75%     Toileting Toileting    Toileting assist Assist for toileting: Minimal Assistance - Patient > 75%     Transfers Chair/bed transfer  Transfers assist     Chair/bed transfer assist level: Contact Guard/Touching assist     Locomotion Ambulation   Ambulation assist      Assist level: Minimal Assistance - Patient > 75% Assistive device: Walker-rolling Max distance: 60'   Walk 10 feet activity   Assist  Walk 10 feet activity did not occur: Safety/medical concerns(unable without AD; min assist with RW)  Assist level: Minimal Assistance - Patient > 75% Assistive device: Walker-rolling   Walk 50 feet activity   Assist Walk 50 feet with 2 turns activity did not occur: Safety/medical concerns  Assist level: Minimal Assistance - Patient > 75% Assistive device: Walker-rolling  Walk 150 feet activity   Assist Walk 150 feet activity did not occur: Safety/medical concerns         Walk 10 feet on uneven surface  activity   Assist Walk 10 feet on uneven surfaces activity did not occur: Safety/medical concerns         Wheelchair     Assist Will patient use wheelchair at discharge?: No Type of Wheelchair: Manual    Wheelchair assist level: Supervision/Verbal cueing Max wheelchair distance: 25    Wheelchair 50 feet with 2 turns  activity    Assist    Wheelchair 50 feet with 2 turns activity did not occur: Safety/medical concerns       Wheelchair 150 feet activity     Assist  Wheelchair 150 feet activity did not occur: Safety/medical concerns       Blood pressure 105/72, pulse 87, temperature 97.8 F (36.6 C), resp. rate 18, height 5\' 2"  (1.575 m), weight 95.4 kg, SpO2 100 %.  Medical Problem List and Plan: 1.  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  Antithrombotics: -DVT/anticoagulation:  Mechanical: Sequential compression devices, below knee Bilateral lower extremities              -will need to check to see if can prescribe Lovenox due to increased risk of DVT/PE             -antiplatelet therapy: N/A 3. Pain Management: Continues to be a limiting factor.             3/26- changed oxycodone ot Norco and tylenol prn with robaxin   Relatively controlled with Tylenol on 3/28, patient does not want escalation or change in medications  3/29- has percocet if needed- might also do trigger point injections tomorrow if L shoulder pain not better  Anxiety component as well 4. Mood: LCSW to follow for evaluation and support.              -antipsychotic agents: N/a 5. Neuropsych: This patient is capable of making decisions on her own behalf.  Will consider Neuropsych consult due to anxiety 6. Skin/Wound Care: Routine pressure relief measures.  7. Fluids/Electrolytes/Nutrition: Monitor I/O.  8. GERD/Dysphagia:   Advanced to D2 thins, continue advance as tolerated  3/29- encourage fluids since Cr up to 1.5 and BUN 34 9.T2DM: Hgb A1c- 7.0.Will monitor BS ac/hs. Continue Glucotrol and actos.  Changed Glucerna to Ensure max and addED CM restrictions to diet. D/ced lantus--not home med and resume [place].   Added meal coverage tid.    CBG (last  3)  Recent Labs    09/26/19 0626 09/26/19 1130 09/26/19 1640  GLUCAP 120* 127* 119*   3/29- well controlled- con't meds 10. HTN: Monitor BP tid. Continue Lotension/Norvasc and metoprolol.   Controlled on 3/28  11.  ABLA: Continue iron supplement.  Hemoglobin 9.6 on 3/26  Continue to monitor 12. Thrombocytopenia: Resolved  Monitor for signs of bleeding.  13. Leucocytosis: Resolved  Monitor for signs of infection.  14. Pre-renal azotemia in setting of AKI on CKD Stage III: Encourage fluid intake.   Creatinine 1.18 on 3/26  3/29- Cr up to 1.5 and BUN 34- hold HCTZ for now and recheck TH  Continue to monitor 15. Morbid Obesity- BMI >40- discuss weight loss in the long term-    LOS: 4 days A FACE TO FACE EVALUATION WAS PERFORMED  Tyrian Peart 09/26/2019, 7:41 PM

## 2019-09-26 NOTE — Progress Notes (Signed)
Occupational Therapy Session Note  Patient Details  Name: Doris Jackson MRN: [id number] Date of Birth: 29-Aug-1940  Today's Date: 09/26/2019 OT Individual Time: 1100-1200 OT Individual Time Calculation (min): 60 min    Short Term Goals: Week 1:  OT Short Term Goal 1 (Week 1): Pt will complete toilet transfer wiht CGA OT Short Term Goal 2 (Week 1): Pt will thread BLE into pants wiht AE PRN OT Short Term Goal 3 (Week 1): Pt will don B socks wiht AE PRN OT Short Term Goal 4 (Week 1): Pt will require no more than min VC for cervical precauitons during ADLs OT Short Term Goal 5 (Week 1): Pt will groom in standing to demo improved endruance wiht CGA  Skilled Therapeutic Interventions/Progress Updates:    Pt resting in bed upon arrival and agreeable to therapy. Pt stated she already "washed up" and changed clothes earlier.  OT intervention with focus on bed mobility, sit<>stand, functional amb with RW, sit<>stand, LUE FMC tasks, review of safety precautions, and activity tolerance to increase independence with BADLs. Supine>sit EOB with supervision. Dependent for donning cervical collar.  Min A for sit<>stand and amb with RW to w/c.  Pt required mod verbal cues for following cervical precautions while seated EOB. Pt transitioned to gym and engaged in [facility] and strengthening tasks with theraputty and peg board. Pt noted with difficulty picking up small pegs with pincher grasp and utilized gross grasp to place pegs in peg board. Pt able to removed top to theraputty container with L hand and removed putty.  Pt used L hand to assist with rolling putty into ball and snake. Pt used L hand to flatten putty on table and removed and return to container. Pt returned to room and remained in w/c with belt alarm activated and all needs within reach.   Therapy Documentation Precautions:  Precautions Precautions: Cervical, Fall Precaution Booklet Issued: Yes (comment) Precaution Comments: reviewd cervial  precautions with pt and husband with handout provided Required Braces or Orthoses: Cervical Brace Cervical Brace: Hard collar Restrictions Weight Bearing Restrictions: No   Pain: Pt c/o BLE tenderness when donning Ted hose; emotional support  Therapy/Group: Individual Therapy  Leroy Libman 09/26/2019, 12:25 PM

## 2019-09-27 ENCOUNTER — Inpatient Hospital Stay (HOSPITAL_COMMUNITY): Payer: Medicare HMO | Admitting: Physical Therapy

## 2019-09-27 ENCOUNTER — Inpatient Hospital Stay (HOSPITAL_COMMUNITY): Payer: Medicare HMO

## 2019-09-27 LAB — GLUCOSE, CAPILLARY
Glucose-Capillary: 145 mg/dL — ABNORMAL HIGH (ref 70–99)
Glucose-Capillary: 123 mg/dL — ABNORMAL HIGH (ref 70–99)
Glucose-Capillary: 178 mg/dL — ABNORMAL HIGH (ref 70–99)
Glucose-Capillary: 126 mg/dL — ABNORMAL HIGH (ref 70–99)
Glucose-Capillary: 74 mg/dL (ref 70–99)

## 2019-09-27 MED ORDER — HEPARIN SODIUM (PORCINE) 5000 UNIT/ML IJ SOLN
5000.0000 [IU] | Freq: Three times a day (TID) | INTRAMUSCULAR | Status: DC
  Administered 2019-09-27 – 2019-10-07 (×30): 5000 [IU] via SUBCUTANEOUS
  Filled 2019-09-27 (×30): qty 1

## 2019-09-27 NOTE — Progress Notes (Signed)
Occupational Therapy Session Note  Patient Details  Name: Doris Jackson MRN: [id number] Date of Birth: Nov 30, 1940  Today's Date: 09/27/2019 OT Individual Time: 1330-1430 OT Individual Time Calculation (min): 60 min    Short Term Goals: Week 1:  OT Short Term Goal 1 (Week 1): Pt will complete toilet transfer wiht CGA OT Short Term Goal 2 (Week 1): Pt will thread BLE into pants wiht AE PRN OT Short Term Goal 3 (Week 1): Pt will don B socks wiht AE PRN OT Short Term Goal 4 (Week 1): Pt will require no more than min VC for cervical precauitons during ADLs OT Short Term Goal 5 (Week 1): Pt will groom in standing to demo improved endruance wiht CGA  Skilled Therapeutic Interventions/Progress Updates:    Pt resting in recliner upon arrival. OT intervention with focus on LUE gross and [facility] tasks including crumpling paper towels with L hand only and tossing onto floor, tearing copy paper and crumpling smaller pieces of paper, picking up small foam blocks using pincher grasp, and placing and removing colored pegs on peg board.  Pt c/o decreased sensation and pt educated on importance on watching her LUE when engaged in tasks.  Pt verbalized understanding. Pt with improved [place] but continues to require extra time to complete tasks.  Pt requested to use toilet and amb with RW to bathroom with CGA.  Pt required CGA for toileting tasks.  Pt removed clothing and donned hospital gown.  Pt returned to room and sat EOB.  Sit>supine with supervision and pt able to reposition without assistance.  Pt remained in bed with all needs within reach and bed alarm activated.   Therapy Documentation Precautions:  Precautions Precautions: Cervical, Fall Precaution Booklet Issued: Yes (comment) Precaution Comments: reviewd cervial precautions with pt and husband with handout provided Required Braces or Orthoses: Cervical Brace Cervical Brace: Hard collar Restrictions Weight Bearing Restrictions: No Pain:  Pt states her  shoulder is "doing ok"   Therapy/Group: Individual Therapy  Leroy Libman 09/27/2019, 2:35 PM

## 2019-09-27 NOTE — Progress Notes (Signed)
Physical Therapy Session Note  Patient Details  Name: Doris Jackson MRN: [id number] Date of Birth: March 08, 1941  Today's Date: 09/27/2019 PT Individual Time: 0800-0855 PT Individual Time Calculation (min): 55 min   Short Term Goals: Week 1:  PT Short Term Goal 1 (Week 1): Pt will be able to perform basic transfers at supervision level PT Short Term Goal 2 (Week 1): Pt will be able to perform bed mobility with supervision PT Short Term Goal 3 (Week 1): Pt will be able to perform stairs with CGA PT Short Term Goal 4 (Week 1): Pt will be able to gait x 50' with CGA  Skilled Therapeutic Interventions/Progress Updates:    Pt received seated in bed, agreeable to PT session. No complaints of pain at rest, does have onset of L upper trap pain with mobility. Provided hot pack at end of session for pain relief. Pt also reports being premedicated prior to start of therapy session. Supine to sit with CGA with use of bedrail and HOB elevated. Sit to stand with CGA to RW. Stand pivot transfer bed to w/c with RW and CGA. Ascend/descend 2 x 6" stairs with 2 handrails and CGA to min A, step-to gait pattern. Pt declines to perform any more stairs due to only having two stairs at home. Attempt to have pt perform stairs a 2nd time for overall endurance with regards to stairs, once in standing pt has increase in anxiety and reports feeling dizzy, requests to return to sitting. Seated BP 93/57, symptoms resolve with seated rest break. Pt declines any further standing activity due to anxiety and feeling "weak". Ambulation x 10 ft from w/c back to bed with RW and CGA. Pt fearful of ambulating short distance, provided encouragement and support. Sit to supine mod A for BLE management back into bed. Rolling L/R with min A with focus on safe positioning in bed as pt reports she gets tired of sleeping on her back. Discussed safe positioning in sidelying and pillow placement for improved comfort and to maintain cervical precautions.  Pt left in semi-R sidelying in bed with hot pack to L upper trap, needs in reach, bed alarm in place.  Therapy Documentation Precautions:  Precautions Precautions: Cervical, Fall Precaution Booklet Issued: Yes (comment) Precaution Comments: reviewd cervial precautions with pt and husband with handout provided Required Braces or Orthoses: Cervical Brace Cervical Brace: Hard collar Restrictions Weight Bearing Restrictions: No    Therapy/Group: Individual Therapy   Excell Seltzer, PT, DPT  09/27/2019, 11:28 AM

## 2019-09-27 NOTE — Progress Notes (Signed)
[place] PHYSICAL MEDICINE & REHABILITATION PROGRESS NOTE   Subjective/Complaints:  L shoulder better- a lot better so doesn't want to do trigger point injections.   Said "talks crazy on Percocet". Will change to Norco due to this and see how she does.  Denies bowel and bladder issues.   ROS:   Pt denies SOB, abd pain, CP, N/V/C/D, and vision changes   Objective:   No results found. Recent Labs    09/26/19 0625  WBC 7.0  HGB 9.3*  HCT 28.4*  PLT 183   Recent Labs    09/26/19 0625  NA 140  K 3.8  CL 101  CO2 27  GLUCOSE 123*  BUN 34*  CREATININE 1.50*  CALCIUM 9.1    Intake/Output Summary (Last 24 hours) at 09/27/2019 1007 Last data filed at 09/27/2019 0945 Gross per 24 hour  Intake 12290 ml  Output -  Net 12290 ml     Physical Exam: Vital Signs Blood pressure 126/74, pulse (!) 107, temperature 98.1 F (36.7 C), temperature source Oral, resp. rate 18, height 5\' 2"  (1.575 m), weight 94.2 kg, SpO2 96 %.  Gen: awake, alert, in manual w/c heading to gym with PT, NAD CV: tachycardic regular rhythm Pulm: CTA B/L- good air movement GI: soft, NT, ND, (+)BS Skin: Vascular changes bilateral lower extremities Psych: less anxious Musc: Lower extremity edema; still a little tight/TTP over L rhomboids, L upper trap, but better Neuro: Ox3 Motor: Grossly 4 -/5 throughout, unchanged  Assessment/Plan: 1. 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  Physiatrist is providing close team supervision and 24 hour management of active medical problems listed below.  Physiatrist and rehab team continue to assess barriers to discharge/monitor patient progress toward functional and medical goals  Care Tool:  Bathing    Body parts bathed by  patient: Left arm, Chest, Abdomen, Front perineal area, Buttocks, Right upper leg, Left upper leg, Face   Body parts bathed by helper: Right lower leg, Left lower leg, Right arm     Bathing assist Assist Level: Moderate Assistance - Patient 50 - 74%     Upper Body Dressing/Undressing Upper body dressing   What is the patient wearing?: Pull over shirt    Upper body assist Assist Level: Minimal Assistance - Patient > 75%    Lower Body Dressing/Undressing Lower body dressing      What is the patient wearing?: Incontinence brief     Lower body assist Assist for lower body dressing: Minimal Assistance - Patient > 75%     Toileting Toileting    Toileting assist Assist for toileting: Minimal Assistance - Patient > 75%     Transfers Chair/bed transfer  Transfers assist     Chair/bed transfer assist level: Contact Guard/Touching assist     Locomotion Ambulation   Ambulation assist      Assist level: Minimal Assistance - Patient > 75% Assistive device: Walker-rolling Max distance: 60'   Walk 10 feet activity   Assist  Walk 10 feet activity did not occur: Safety/medical concerns(unable without AD; min assist with RW)  Assist level: Minimal Assistance - Patient > 75% Assistive device: Walker-rolling   Walk 50 feet activity   Assist Walk 50 feet with 2 turns activity did not occur: Safety/medical concerns  Assist level: Minimal Assistance - Patient > 75% Assistive  device: Walker-rolling    Walk 150 feet activity   Assist Walk 150 feet activity did not occur: Safety/medical concerns         Walk 10 feet on uneven surface  activity   Assist Walk 10 feet on uneven surfaces activity did not occur: Safety/medical concerns         Wheelchair     Assist Will patient use wheelchair at discharge?: No Type of Wheelchair: Manual    Wheelchair assist level: Supervision/Verbal cueing Max wheelchair distance: 25    Wheelchair 50 feet with 2  turns activity    Assist    Wheelchair 50 feet with 2 turns activity did not occur: Safety/medical concerns       Wheelchair 150 feet activity     Assist  Wheelchair 150 feet activity did not occur: Safety/medical concerns       Blood pressure 126/74, pulse (!) 107, temperature 98.1 F (36.7 C), temperature source Oral, resp. rate 18, height 5\' 2"  (1.575 m), weight 94.2 kg, SpO2 96 %.  Medical Problem List and Plan: 1.  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  Antithrombotics: -DVT/anticoagulation:  Mechanical: Sequential compression devices, below knee Bilateral lower extremities              -will need to check to see if can prescribe Lovenox due to increased risk of DVT/PE             -antiplatelet therapy: N/A 3. Pain Management: Continues to be a limiting factor.             3/26- changed oxycodone ot Norco and tylenol prn with robaxin   3/30- will wait on trigger point injections- will switch percocet to Norco since it "makes her talk crazy".   Anxiety component as well 4. Mood: LCSW to follow for evaluation and support.              -antipsychotic agents: N/a 5. Neuropsych: This patient is capable of making decisions on her own behalf.  Will consider Neuropsych consult due to anxiety 6. Skin/Wound Care: Routine pressure relief measures.  7. Fluids/Electrolytes/Nutrition: Monitor I/O.  8. GERD/Dysphagia:   Advanced to D2 thins, continue advance as tolerated  3/29- encourage fluids since Cr up to 1.5 and BUN 34  3/30- stopped HCTZ as of this AM due to Cr/BUN increase- will recheck labs in AM-  9.T2DM: Hgb A1c- 7.0.Will monitor BS ac/hs. Continue Glucotrol and actos.  Changed Glucerna to Ensure max and addED CM restrictions to diet. D/ced lantus--not home med and resume [place].   Added meal coverage  tid.    CBG (last 3)  Recent Labs    09/26/19 1130 09/26/19 1640 09/27/19 0646  GLUCAP 127* 119* 123*   3/30- great control- con't meds 10. HTN: Monitor BP tid. Continue Lotension/Norvasc and metoprolol.   3/30- stopped HCTZ due to Cr of 1.50- will monitor- controlled so far.   11.  ABLA: Continue iron supplement.  Hemoglobin 9.6 on 3/26  Continue to monitor 12. Thrombocytopenia: Resolved  Monitor for signs of bleeding.  13. Leucocytosis: Resolved  Monitor for signs of infection.  14. Pre-renal azotemia in setting of AKI on CKD Stage III: Encourage fluid intake.   Creatinine 1.18 on 3/26  3/30- will recheck in AM off HCTZ  Continue to monitor 15. Morbid Obesity- BMI >40- discuss weight loss in the long  term-    LOS: 5 days A FACE TO FACE EVALUATION WAS PERFORMED  Meridian Scherger 09/27/2019, 10:07 AM

## 2019-09-27 NOTE — Progress Notes (Signed)
Physical Therapy Session Note  Patient Details  Name: Doris Jackson MRN: [id number] Date of Birth: [birth year]  Today's Date: 09/27/2019 PT Individual Time: 1000-1113 PT Individual Time Calculation (min): 73 min   Short Term Goals: Week 1:  PT Short Term Goal 1 (Week 1): Pt will be able to perform basic transfers at supervision level PT Short Term Goal 2 (Week 1): Pt will be able to perform bed mobility with supervision PT Short Term Goal 3 (Week 1): Pt will be able to perform stairs with CGA PT Short Term Goal 4 (Week 1): Pt will be able to gait x 50' with CGA  Skilled Therapeutic Interventions/Progress Updates:    Bed mobility with extra time from flat surface with CGA for balance once seated EOB and scooting forward. Performed stand step transfers with RW with CGA overall throughout session with cues for hand placement and to lean forward for anterior weightshift. Nustep with BUE/BLE x 5 min on level 3 for warm-up and functional strengthening/endurance training. Rest break due to fatigue and then did 2 more minutes with BLE only. NMR for dynamic standing balance and functional reaching task and manipulation of large connect 4 pieces to engage in game of connect 4. Multiple sit <> stands, reaching outside of BOS, and placing piece in gameboard with multiple rest breaks and overall CGA needed for balance. Max encouragement to continue activity and education on relation to real life needs with being able to stand and walk at home, stand to prepare meals or do small chores around the house, etc at an indpendent level due to not having 24/7 assist. Pt verbalized understanding and husband arrived to session to reinforce. Pt continues to ask if she can just perform activity seated. Discussed energy conservation as well as need to push herself during therapy sessions to increase overall endurance and activity tolerance. Pt verbalized understanding. Compromised to work on fine motor task in seated position  for next activity to manipulate pegs on pegboard (pt did not match picture set up by PT) with LUE primarily and then had patient manipulate gloves to donn and clean off pegs. Gait training with RW x 60', x 30', x 10' with CGA with wider BOS, decreased trunk rotation, decreased hip flexion on L, and flexed posture. End of session transfer to recliner with all needs in reach and family at bedside.   Therapy Documentation Precautions:  Precautions Precautions: Cervical, Fall Precaution Booklet Issued: Yes (comment) Precaution Comments: reviewd cervial precautions with pt and husband with handout provided Required Braces or Orthoses: Cervical Brace Cervical Brace: Hard collar Restrictions Weight Bearing Restrictions: No Pain: Reports pain is relieved in supine with heating pad from earlier. Slow movements and increased activity during session to tolerance. Pt did not complain of increased pain in L neck/shoulder.     Therapy/Group: Individual Therapy  Canary Brim Ivory Broad, PT, DPT, CBIS  09/27/2019, 12:04 PM

## 2019-09-27 NOTE — Progress Notes (Signed)
Social Work Patient ID: Doris Jackson, female   DOB: 19-Oct-1940, 79 y.o.   MRN: [id number]   SW met with pt, pt dtr and pt husband to review updates from team conference, and d/c date 10/07/2019.  SW will continue to follow and assess pt for discharge needs.   Loralee Pacas, MSW, [place] Office: [phone] Cell: [phone] Fax: [phone]

## 2019-09-27 NOTE — Patient Care Conference (Signed)
Inpatient RehabilitationTeam Conference and Plan of Care Update Date: 09/27/2019   Time: 11:15 AM   Patient Name: Doris Jackson      Medical Record Number: [id number]  Date of Birth: [birth year] Sex: Female         Room/Bed: 4W26C/4W26C-01 Payor Info: Payor: [employer] MEDICARE / Plan: [employer] MEDICARE HMO/PPO / Product Type: *No Product type* /    Admit Date/Time:  09/22/2019  4:22 PM  Primary Diagnosis:  Cervical myelopathy [facility])  Patient Active Problem List   Diagnosis Date Noted  . Anxiety state   . Acute blood loss anemia   . Labile blood glucose   . Dysphagia   . Postoperative pain   . Cervical myelopathy ([place]) 09/22/2019  . S/P cervical spinal fusion 09/22/2019  . 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 AKI (acute kidney injury) ([place]) 09/14/2019  . GERD (gastroesophageal reflux disease) 09/14/2019  . Spinal stenosis in cervical region 09/14/2019  . Weakness 09/14/2019  . Numbness and tingling 09/14/2019  . Cervical spinal stenosis 09/14/2019  . Lumbar stenosis 08/24/2019  . Obesity, morbid, BMI 40.0-49.9 ([place]) 04/19/2018  . Cataract   . Fibroid   . Endometriosis   . DM (diabetes mellitus) type II controlled with renal manifestation ([place]) 06/17/2010  . Dyslipidemia 06/17/2010  . Microcytic anemia 06/17/2010  . Essential hypertension 06/17/2010  . Allergic rhinitis 06/17/2010  . Osteoarthritis 06/17/2010  . LOW BACK PAIN 06/17/2010  . OSTEOPENIA 06/17/2010  . BREAST CANCER, HX OF 06/17/2010    Expected Discharge Date: Expected Discharge Date: 10/07/19  Team Members Present: Physician leading conference: Dr. Courtney Heys Care Coodinator Present: Loralee Pacas, LCSWA;Genie Keyna Blizard, RN, MSN Nurse Present: Suella Grove, RN PT Present: Excell Seltzer, PT OT Present: Willeen Cass, OT;Roanna Epley, COTA SLP Present: Jettie Booze, CF-SLP PPS Coordinator present : Gunnar Fusi, SLP     Current Status/Progress Goal Weekly Team Focus   Bowel/Bladder   continent of b/b; LBM: 03/29  remain continent of b/b  assist with toileting need prn   Swallow/Nutrition/ Hydration             ADL's   bathing-mod A; UB dressing-min a; LB dressing-max A; toileting transfers-min A/CGA; toileting-min A  supervision overall  safety awareness, activity tolerance, BADL training, education   Mobility   min/mod A bed mobility, CGA/min A sit to stand and transfer with RW, gait up to 60 ft with RW min A, min A one step  mod I overall, Supervision stairs  endurance, transfers, gait   Communication             Safety/Cognition/ Behavioral Observations            Pain   c/o pain to neck; prn tylenol and norco  pain level <4/10  assess pain level QS and prn   Skin              Rehab Goals Patient on target to meet rehab goals: Yes *See Care Plan and progress notes for long and short-term goals.     Barriers to Discharge  Current Status/Progress Possible Resolutions Date Resolved   Nursing                  PT  Decreased caregiver support  husband works and pt only has intermittent support              OT                  SLP  SW Decreased caregiver support;Lack of/limited family support              Discharge Planning/Teaching Needs:  D/c to home with 24/7 care from husband. Pt has a dtr that will do intermittent check-ins atleast once per day  Family education as recommended by therapy   Team Discussion: MD 79 yo with cervical myelopathy, on norco, D2thins, creat/BUN elevated, DC HCTZ, monitoring labs, enc fluids, BS okay.  RN pain, tylenol given, cont B/B, BM 3/29.  PT min/mod bed, S stand up, CGA/min A 60', min A 2 stairs, self limiting.  OT S goals, bathing min/mod, min LB dressing, min UB dressing, amb to bathroom CGA yesterday.   Revisions to Treatment Plan: N/A     Medical Summary Current Status: continent B/L; skin good except incision which looks good; BGs good Weekly Focus/Goal: min-mod bed; Sup to  stand; 60 ft RW; CGA-min assist; min assist for stairs; self limited and endurance  Barriers to Discharge: Behavior;Decreased family/caregiver support;Medical stability;Home enviroment access/layout;Weight bearing restrictions;Weight  Barriers to Discharge Comments: n/a Possible Resolutions to Barriers: bathing min-mod assist; min assist lB; UB min assist; self limiting- refused OT in PM yesterday; poor endurance; moves well   Continued Need for Acute Rehabilitation Level of Care: The patient requires daily medical management by a physician with specialized training in physical medicine and rehabilitation for the following reasons: Direction of a multidisciplinary physical rehabilitation program to maximize functional independence : Yes Medical management of patient stability for increased activity during participation in an intensive rehabilitation regime.: Yes Analysis of laboratory values and/or radiology reports with any subsequent need for medication adjustment and/or medical intervention. : Yes   I attest that I was present, lead the team conference, and concur with the assessment and plan of the team.   Jodell Cipro M 09/27/2019, 7:28 PM   Team conference was held via web/ teleconference due to [place] - 19

## 2019-09-28 ENCOUNTER — Inpatient Hospital Stay (HOSPITAL_COMMUNITY): Payer: Medicare HMO | Admitting: Physical Therapy

## 2019-09-28 ENCOUNTER — Inpatient Hospital Stay (HOSPITAL_COMMUNITY): Payer: Medicare HMO

## 2019-09-28 LAB — CBC WITH DIFFERENTIAL/PLATELET
Abs Immature Granulocytes: 0.07 10*3/uL (ref 0.00–0.07)
Basophils Absolute: 0 10*3/uL (ref 0.0–0.1)
Basophils Relative: 0 %
Eosinophils Absolute: 0.2 10*3/uL (ref 0.0–0.5)
Eosinophils Relative: 2 %
HCT: 27.6 % — ABNORMAL LOW (ref 36.0–46.0)
Hemoglobin: 9.1 g/dL — ABNORMAL LOW (ref 12.0–15.0)
Immature Granulocytes: 1 %
Lymphocytes Relative: 27 %
Lymphs Abs: 1.9 10*3/uL (ref 0.7–4.0)
MCH: 26.2 pg (ref 26.0–34.0)
MCHC: 33 g/dL (ref 30.0–36.0)
MCV: 79.5 fL — ABNORMAL LOW (ref 80.0–100.0)
Monocytes Absolute: 1.1 10*3/uL — ABNORMAL HIGH (ref 0.1–1.0)
Monocytes Relative: 15 %
Neutro Abs: 3.9 10*3/uL (ref 1.7–7.7)
Neutrophils Relative %: 55 %
Platelets: 180 10*3/uL (ref 150–400)
RBC: 3.47 MIL/uL — ABNORMAL LOW (ref 3.87–5.11)
RDW: 16.7 % — ABNORMAL HIGH (ref 11.5–15.5)
WBC: 7.2 10*3/uL (ref 4.0–10.5)
nRBC: 0 % (ref 0.0–0.2)

## 2019-09-28 LAB — BASIC METABOLIC PANEL
Anion gap: 10 (ref 5–15)
BUN: 49 mg/dL — ABNORMAL HIGH (ref 8–23)
CO2: 27 mmol/L (ref 22–32)
Calcium: 8.7 mg/dL — ABNORMAL LOW (ref 8.9–10.3)
Chloride: 99 mmol/L (ref 98–111)
Creatinine, Ser: 2.05 mg/dL — ABNORMAL HIGH (ref 0.44–1.00)
GFR calc Af Amer: 26 mL/min — ABNORMAL LOW (ref >60)
GFR calc non Af Amer: 23 mL/min — ABNORMAL LOW (ref >60)
Glucose, Bld: 125 mg/dL — ABNORMAL HIGH (ref 70–99)
Potassium: 4 mmol/L (ref 3.5–5.1)
Sodium: 136 mmol/L (ref 135–145)

## 2019-09-28 LAB — GLUCOSE, CAPILLARY
Glucose-Capillary: 128 mg/dL — ABNORMAL HIGH (ref 70–99)
Glucose-Capillary: 103 mg/dL — ABNORMAL HIGH (ref 70–99)
Glucose-Capillary: 106 mg/dL — ABNORMAL HIGH (ref 70–99)
Glucose-Capillary: 88 mg/dL (ref 70–99)

## 2019-09-28 NOTE — Progress Notes (Signed)
Physical Therapy Session Note  Patient Details  Name: Doris Jackson MRN: [id number] Date of Birth: Jun 30, 1941  Today's Date: 09/28/2019 PT Individual Time: 2446-2863 PT Individual Time Calculation (min): 30 min   Short Term Goals: Week 1:  PT Short Term Goal 1 (Week 1): Pt will be able to perform basic transfers at supervision level PT Short Term Goal 2 (Week 1): Pt will be able to perform bed mobility with supervision PT Short Term Goal 3 (Week 1): Pt will be able to perform stairs with CGA PT Short Term Goal 4 (Week 1): Pt will be able to gait x 50' with CGA  Skilled Therapeutic Interventions/Progress Updates:   Pt received sitting in recliner and agreeable to PT. Pt hesitant to participate, but eventually agreeable with encouragement from PT. Sit<>stand from recliner with CGA and RW. Pt performed BLE strengthening/coordination training with BUE supported on RW. reciprocal marches x 6 BLE, hip abduction x 5 BLE, mini squats x 4. Min assist throughout from PT with noted ataxia in BLE with hip abduction. Pt reports increased FOF due to poor control of BLE, requesting to sit back down. Stand pivot transfer to bed with CGA and RW. Sit<>stand EOB with supervision assist, followed by Side stepping L for improved positioning closer to [facility]; CGA from PT for safety and AD management. Pt then requesting to lie down. Sit>supine  With min assist at LE. Pt left supine in bed with call bell in reach and all needs met.       Therapy Documentation Precautions:  Precautions Precautions: Cervical, Fall Precaution Booklet Issued: Yes (comment) Precaution Comments: reviewd cervial precautions with pt and husband with handout provided Required Braces or Orthoses: Cervical Brace Cervical Brace: Hard collar Restrictions Weight Bearing Restrictions: No Vital Signs: Therapy Vitals Temp: 98.1 F (36.7 C) Temp Source: Axillary Pulse Rate: 90 Resp: 20 BP: 109/70 Patient Position (if appropriate):  Sitting Oxygen Therapy SpO2: 100 % O2 Device: Room Air Pain: 5/10 general. Pt reposition to bed from recliner.    Therapy/Group: Individual Therapy  Lorie Phenix 09/28/2019, 5:37 PM

## 2019-09-28 NOTE — Progress Notes (Signed)
Occupational Therapy Session Note  Patient Details  Name: Doris Jackson MRN: [id number] Date of Birth: Jan 20, 1941  Today's Date: 09/28/2019 OT Individual Time: 1100-1125 OT Individual Time Calculation (min): 25 min    Short Term Goals: Week 1:  OT Short Term Goal 1 (Week 1): Pt will complete toilet transfer wiht CGA OT Short Term Goal 2 (Week 1): Pt will thread BLE into pants wiht AE PRN OT Short Term Goal 3 (Week 1): Pt will don B socks wiht AE PRN OT Short Term Goal 4 (Week 1): Pt will require no more than min VC for cervical precauitons during ADLs OT Short Term Goal 5 (Week 1): Pt will groom in standing to demo improved endruance wiht CGA  Skilled Therapeutic Interventions/Progress Updates:    Pt resting in bed upon arrival.  Pt stated she was "worn out" from earlier therapy but was agreeable to bed level exercises. Pt engaged in BUE therex with green "dodge ball" includng chest presses, straight arm raises, biceps curls-3X10 with extended rest breaks.  Pt's activity level at home was very limited and pt states she usually only got up and was active in morning until about 11 before she rested. Pt remained in bed with all needs within reach and bed alarm activated.   Therapy Documentation Precautions:  Precautions Precautions: Cervical, Fall Precaution Booklet Issued: Yes (comment) Precaution Comments: reviewd cervial precautions with pt and husband with handout provided Required Braces or Orthoses: Cervical Brace Cervical Brace: Hard collar Restrictions Weight Bearing Restrictions: No  Pain:  Pt denies pain this morning   Therapy/Group: Individual Therapy  Leroy Libman 09/28/2019, 1:35 PM

## 2019-09-28 NOTE — Progress Notes (Signed)
Occupational Therapy Session Note  Patient Details  Name: Doris Jackson MRN: [id number] Date of Birth: Feb 08, 1941  Today's Date: 09/28/2019 OT Individual Time:  -       Short Term Goals: Week 1:  OT Short Term Goal 1 (Week 1): Pt will complete toilet transfer wiht CGA OT Short Term Goal 2 (Week 1): Pt will thread BLE into pants wiht AE PRN OT Short Term Goal 3 (Week 1): Pt will don B socks wiht AE PRN OT Short Term Goal 4 (Week 1): Pt will require no more than min VC for cervical precauitons during ADLs OT Short Term Goal 5 (Week 1): Pt will groom in standing to demo improved endruance wiht CGA  Skilled Therapeutic Interventions/Progress Updates:  Pt received supine in bed with RN present giving Am meds and assessing BP; BP supine 89/53 (62) RN approve advancing to EOB pending BP. Assisted pt to sitting EOB with CGA with HOB elevated 8 degrees. Pt required verbal cues to carryover log roll technique. BP 105/74 sitting EOB. Pt required total A to don socks and TED hose EOB with MOD A to don pants. Pt sit<>stand with CGA able to pull pants up to waist line with CGA for balance. Pt ambulated ~ 5 ft from EOB>w/c at sink with RW and CGA. Pt completed seated grooming tasks with set- up assist needing verbal cues to maintain cervical precautions. Pt transported to therapy gym from w/c with total A for time mgmt. Pt completed standing tolerance therapeutic activity where pt instructed to retrieve bean bags with LUE from side table and toss into bucket. Pt able to stand 1 min and 30 secs total needing a seated rest break halfway. Pt completed seated bilateral integration task to increase [facility] for ADL/ IADL participation. Pt able to break apart legos with BUEs and place in bucket; pt complete task with supervision. Pt completed functional mobility back to w/c ~ 4 ft with RW and CGA where pt was transported back to room from w/c. Pt ambulated from sink to EOB to return to supine with CGA and RW. MOD A to return  to supine needing assist to elevate BLEs back to bed. Pt left supine in bed with all needs within reach and bed alarm activated.   Therapy Documentation Precautions:  Precautions Precautions: Cervical, Fall Precaution Booklet Issued: Yes (comment) Precaution Comments: reviewd cervial precautions with pt and husband with handout provided Required Braces or Orthoses: Cervical Brace Cervical Brace: Hard collar Restrictions Weight Bearing Restrictions: No General:   Vital Signs: Therapy Vitals Temp: 97.9 F (36.6 C) Temp Source: Oral Pulse Rate: 97 Resp: 18 BP: (!) 89/52 Patient Position (if appropriate): Lying Oxygen Therapy SpO2: 100 % O2 Device: Room Air Pain: Pt reports no pain during session.   Therapy/Group: Individual Therapy  [name] 09/28/2019, 8:35 AM

## 2019-09-28 NOTE — Progress Notes (Signed)
Physical Therapy Session Note  Patient Details  Name: Doris Jackson MRN: [id number] Date of Birth: [birth year]  Today's Date: 09/28/2019 PT Individual Time: 1300-1410 PT Individual Time Calculation (min): 70 min   Short Term Goals: Week 1:  PT Short Term Goal 1 (Week 1): Pt will be able to perform basic transfers at supervision level PT Short Term Goal 2 (Week 1): Pt will be able to perform bed mobility with supervision PT Short Term Goal 3 (Week 1): Pt will be able to perform stairs with CGA PT Short Term Goal 4 (Week 1): Pt will be able to gait x 50' with CGA  Skilled Therapeutic Interventions/Progress Updates:    Pt received seated in recliner in room, agreeable to PT session. No complaints of pain. Pt's husband present at beginning of therapy session and expressing interest in pt's BP level due to meds being held earlier this AM. Seated BP 108/61. Pt reports urge to urinate. Sit to stand with Supervision to RW. Ambulation into bathroom with RW and CGA, v/c for decreased speed and increased safety awareness. Toilet transfer with Supervision, setup A for pericare and min A for clothing management. Ambulation back to w/c with RW and CGA. Dependent transport to/from therapy gym via w/c for time and energy conservation. Ambulation x 100 ft, x 50 ft with RW and CGA. Pt exhibits improved endurance for gait training this date. Pt does have onset of some ataxia with onset of fatigue, decreased from previous sessions. Seated fine motor task with focus on use of LUE>RUE: putty rolling, pinching, and finding beads. Nustep level 3 x 5 min with use of B UE/LE for global endurance training. Stand pivot transfer w/c to recliner with RW and CGA. Seated BP at end of session, 125/68. Pt left seated in recliner with chair alarm in place, needs in reach at end of session.  Therapy Documentation Precautions:  Precautions Precautions: Cervical, Fall Precaution Booklet Issued: Yes (comment) Precaution Comments:  reviewd cervial precautions with pt and husband with handout provided Required Braces or Orthoses: Cervical Brace Cervical Brace: Hard collar Restrictions Weight Bearing Restrictions: No    Therapy/Group: Individual Therapy   Excell Seltzer, PT, DPT  09/28/2019, 3:37 PM

## 2019-09-29 ENCOUNTER — Inpatient Hospital Stay (HOSPITAL_COMMUNITY): Payer: Medicare HMO

## 2019-09-29 ENCOUNTER — Inpatient Hospital Stay (HOSPITAL_COMMUNITY): Payer: Medicare HMO | Admitting: Physical Therapy

## 2019-09-29 LAB — GLUCOSE, CAPILLARY
Glucose-Capillary: 140 mg/dL — ABNORMAL HIGH (ref 70–99)
Glucose-Capillary: 129 mg/dL — ABNORMAL HIGH (ref 70–99)
Glucose-Capillary: 150 mg/dL — ABNORMAL HIGH (ref 70–99)
Glucose-Capillary: 109 mg/dL — ABNORMAL HIGH (ref 70–99)

## 2019-09-29 MED ORDER — SODIUM CHLORIDE 0.9 % NICU IV INFUSION SIMPLE
INJECTION | INTRAVENOUS | Status: AC
  Administered 2019-09-29: 60 mL/h via INTRAVENOUS
  Filled 2019-09-29 (×3): qty 500

## 2019-09-29 MED ORDER — PREGABALIN 50 MG PO CAPS
50.0000 mg | ORAL_CAPSULE | Freq: Two times a day (BID) | ORAL | Status: DC
  Administered 2019-09-29 – 2019-09-30 (×3): 50 mg via ORAL
  Filled 2019-09-29 (×8): qty 1

## 2019-09-29 MED ORDER — AMLODIPINE BESYLATE 10 MG PO TABS
10.0000 mg | ORAL_TABLET | Freq: Every day | ORAL | Status: DC
  Administered 2019-09-29 – 2019-10-07 (×9): 10 mg via ORAL
  Filled 2019-09-29 (×9): qty 1

## 2019-09-29 NOTE — Progress Notes (Signed)
Occupational Therapy Session Note  Patient Details  Name: Doris Jackson MRN: [id number] Date of Birth: 1940/11/28  Today's Date: 09/29/2019 OT Individual Time: 1115-1200 OT Individual Time Calculation (min): 45 min  and Today's Date: 09/29/2019 OT Missed Time: 15 Minutes Missed Time Reason: Nursing care(IV nurse)   Short Term Goals: Week 2:  OT Short Term Goal 1 (Week 2): STG=LTG secondary to ELOS  Skilled Therapeutic Interventions/Progress Updates:    Pt sleeping in bed upon arrival but easily aroused.  Pt states she is "very tired" but willing to "try some things" in bed.  Pt engaged in bed mobility after cervical collar donned. Supine>sit EOB with min A/CGA. Sitting balance with supervision.  Sit<>stand from EOB X 5 with CGA/close supervision.  Pt requires extended rest breaks between each task. Pt returned to supine with min A. Pt remained in bed with all needs within reach and all needs within reach.   Therapy Documentation Precautions:  Precautions Precautions: Cervical, Fall Precaution Booklet Issued: Yes (comment) Precaution Comments: reviewd cervial precautions with pt and husband with handout provided Required Braces or Orthoses: Cervical Brace Cervical Brace: Hard collar Restrictions Weight Bearing Restrictions: No General: General OT Amount of Missed Time: 15 Minutes Pain: Pt c/o tingling and pain in B hands; RN aware  Therapy/Group: Individual Therapy  Leroy Libman 09/29/2019, 12:05 PM

## 2019-09-29 NOTE — Progress Notes (Signed)
Occupational Therapy Weekly Progress Note  Patient Details  Name: Doris Jackson MRN: [id number] Date of Birth: Apr 26, 1941  Beginning of progress report period: September 23, 2019 End of progress report period: September 29, 2019     Patient has met 3 of 5 short term goals.Pt has made gradual progress towards OT goals this reporting period. Pt continues to present with decreased activity tolerance, decreased endurance, and decreased LUE ROM and FMC limiting ability to engage in ADLs. Overall, pt requires CGA for functional mobility with RW, MIN A for UB dressing; MOD A for LB dressing and set- up- CGA assist for UB/LB bathing seated at sink. Pt completes seated grooming tasks with set- up assist and can don socks with sock aid with MIN verbal cues. Pt requires intermittent cues to carryover cervical precautions into ADL routines and completes toileting with CGA with RW. Pt only able to tolerate standing therapeutic activities ~ 2 mins before fatiguing; however, pt reports that was her baseline level of function. Family has been supportive during pts LOS and pt will DC home with husband and daughter.   Patient continues to demonstrate the following deficits: muscle weakness, decreased cardiorespiratoy endurance, impaired timing and sequencing, unbalanced muscle activation and decreased coordination, decreased awareness, decreased problem solving and decreased safety awareness and decreased standing balance, decreased postural control, decreased balance strategies and difficulty maintaining precautions and therefore will continue to benefit from skilled OT intervention to enhance overall performance with BADL and iADL.  Patient progressing toward long term goals..  Continue plan of care.  OT Short Term Goals Week 2:  OT Short Term Goal 1 (Week 2): STG=LTG secondary to ELOS  [name] 09/29/2019, 11:19 AM

## 2019-09-29 NOTE — Progress Notes (Signed)
Occupational Therapy Session Note  Patient Details  Name: SESEN LAVENTURE MRN: [id number] Date of Birth: [birth year]  Today's Date: 09/29/2019 OT Individual Time: 0800-0900 OT Individual Time Calculation (min): 60 min    Short Term Goals: Week 1:  OT Short Term Goal 1 (Week 1): Pt will complete toilet transfer wiht CGA OT Short Term Goal 2 (Week 1): Pt will thread BLE into pants wiht AE PRN OT Short Term Goal 3 (Week 1): Pt will don B socks wiht AE PRN OT Short Term Goal 4 (Week 1): Pt will require no more than min VC for cervical precauitons during ADLs OT Short Term Goal 5 (Week 1): Pt will groom in standing to demo improved endruance wiht CGA  Skilled Therapeutic Interventions/Progress Updates:  Pt received from RN with pt needing to use bathroom. Verbal cues needed to carryover log roll technique to transition EOB with HOB slightly elevated.total A to don cervical collar EOB. Pt required CGA with RW to ambulated to bathroom for toilet transfer, CGA for  hygiene. Pt requesting to complete " wash up" on toilet. Set- up assist for UB/LB bathing and CGA for sit<>stand to wash buttocks. Pt required MIN A to don shirt over neck brace and MOD A for LB dressing to don pants and pull up to waist line. Pt ambulated to sink for seated grooming with RW and CGA; set- up assist for oral care. Pt transported to therapy gym for time mgmt where session focus on AE to maintain cervical precautions. Demo'ed use of sock aid with pt able to return demonstrate needing MIN verbal cues for sequencing of task. Pt reports having reacher at home; demo'ed how to use reacher to don pants with pt verbalizing understanding. Pt transported back to room in w/c where pt requested to return to supine. MOD A to elevate BLEs back to bed; pt left with RN present giving AM meds, bed alarm activated and all needs within reach.   Therapy Documentation Precautions:  Precautions Precautions: Cervical, Fall Precaution Booklet Issued:  Yes (comment) Precaution Comments: reviewd cervial precautions with pt and husband with handout provided Required Braces or Orthoses: Cervical Brace Cervical Brace: Hard collar Restrictions Weight Bearing Restrictions: No General:   Vital Signs: Therapy Vitals BP: (!) 125/56 Patient Position (if appropriate): Sitting Pain: Pt reports no pain during session.   Therapy/Group: Individual Therapy  [name] 09/29/2019, 11:10 AM

## 2019-09-29 NOTE — Progress Notes (Signed)
Physical Therapy Session Note  Patient Details  Name: ARILENE METTERS MRN: [id number] Date of Birth: [birth year]  Today's Date: 09/29/2019 PT Individual Time: 1300-1415 PT Individual Time Calculation (min): 75 min   Short Term Goals: Week 1:  PT Short Term Goal 1 (Week 1): Pt will be able to perform basic transfers at supervision level PT Short Term Goal 2 (Week 1): Pt will be able to perform bed mobility with supervision PT Short Term Goal 3 (Week 1): Pt will be able to perform stairs with CGA PT Short Term Goal 4 (Week 1): Pt will be able to gait x 50' with CGA  Skilled Therapeutic Interventions/Progress Updates:    Pt received seated in bed having just finished lunch, agreeable to PT session. No complaints of pain at rest, does have onset of L upper trap pain with mobility. Provided hot pack for pain relief at end of session. Supine to sit with Supervision with increased time, HOB elevated and use of bedrail. Pt is max A to don hard collar while seated EOB. Demonstrated how to safely apply brace for pt's husband. Sit to stand with Supervision to RW. Ambulation x 5 ft to w/c with RW and Supervision. Ambulation x 66 ft, x 45 ft in hallway with RW at Supervision level. Pt exhibits decreased endurance for gait training this date. Pt then requests to work on UE fine motor control. Seated peg board task with use of BUE, focus on LUE>RUE performing grasping and fine motor control creating two peg board designs. Stand pivot transfer w/c to/from mat table with RW and Supervision. Seated balance EOM with SBA while performing ball toss, 3 x 10 reps. Pt then reports urge to use the bathroom. Toilet transfer with Supervision with use of RW, Supervision for clothing management. See Flowsheet for details. Pt left seated in recliner in room with needs in reach at end of session.  Therapy Documentation Precautions:  Precautions Precautions: Cervical, Fall Precaution Booklet Issued: Yes (comment) Precaution  Comments: reviewd cervial precautions with pt and husband with handout provided Required Braces or Orthoses: Cervical Brace Cervical Brace: Hard collar Restrictions Weight Bearing Restrictions: No    Therapy/Group: Individual Therapy   Excell Seltzer, PT, DPT  09/29/2019, 3:33 PM

## 2019-09-29 NOTE — Progress Notes (Addendum)
[place] PHYSICAL MEDICINE & REHABILITATION PROGRESS NOTE   Subjective/Complaints:  Pt reports still on D2 thins- frustrated- doesn't like food- will check with SLP about this.  Pt asked me to call husband- c/o nerve pain- pins and needles in feet and hands- very painful- tylenol doesn't help.    ROS:   Pt denies SOB, abd pain, CP, N/V/C/D, and vision changes   Objective:   No results found. Recent Labs    09/28/19 0435  WBC 7.2  HGB 9.1*  HCT 27.6*  PLT 180   Recent Labs    09/28/19 0435  NA 136  K 4.0  CL 99  CO2 27  GLUCOSE 125*  BUN 49*  CREATININE 2.05*  CALCIUM 8.7*    Intake/Output Summary (Last 24 hours) at 09/29/2019 1039 Last data filed at 09/28/2019 1855 Gross per 24 hour  Intake 220 ml  Output --  Net 220 ml     Physical Exam: Vital Signs Blood pressure (!) 125/56, pulse (!) 106, temperature 98.2 F (36.8 C), temperature source Oral, resp. rate 18, height 5\' 2"  (1.575 m), weight 95.5 kg, SpO2 96 %.  Gen: awake, alert, in manual w/c heading to gym with PT, NAD CV: tachycardic regular rhythm Pulm: CTA B/L- good air movement GI: soft, NT, ND, (+)BS Skin: Vascular changes bilateral lower extremities Psych: less anxious Musc: Lower extremity edema; still a little tight/TTP over L rhomboids, L upper trap, but better Neuro: Ox3 Motor: Grossly 4 -/5 throughout, unchanged  Assessment/Plan: 1. 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  Physiatrist is providing close team supervision and 24 hour management of active medical problems listed below.  Physiatrist and rehab team continue to assess barriers to discharge/monitor patient progress toward functional and medical goals  Care Tool:  Bathing    Body parts  bathed by patient: Face   Body parts bathed by helper: Right lower leg, Left lower leg, Right arm     Bathing assist Assist Level: Set up assist     Upper Body Dressing/Undressing Upper body dressing   What is the patient wearing?: Pull over shirt    Upper body assist Assist Level: Minimal Assistance - Patient > 75%    Lower Body Dressing/Undressing Lower body dressing      What is the patient wearing?: Pants     Lower body assist Assist for lower body dressing: Moderate Assistance - Patient 50 - 74%     Toileting Toileting    Toileting assist Assist for toileting: Contact Guard/Touching assist     Transfers Chair/bed transfer  Transfers assist     Chair/bed transfer assist level: Contact Guard/Touching assist     Locomotion Ambulation   Ambulation assist      Assist level: Contact Guard/Touching assist Assistive device: Walker-rolling Max distance: 100'   Walk 10 feet activity   Assist  Walk 10 feet activity did not occur: Safety/medical concerns(unable without AD; min assist with RW)  Assist level: Contact Guard/Touching assist Assistive device: Walker-rolling   Walk 50 feet activity   Assist Walk 50 feet with 2 turns activity did not occur: Safety/medical concerns  Assist level: Contact Guard/Touching assist Assistive device: Walker-rolling    Walk 150 feet activity   Assist Walk 150 feet activity did not occur: Safety/medical concerns  Walk 10 feet on uneven surface  activity   Assist Walk 10 feet on uneven surfaces activity did not occur: Safety/medical concerns         Wheelchair     Assist Will patient use wheelchair at discharge?: No Type of Wheelchair: Manual    Wheelchair assist level: Supervision/Verbal cueing Max wheelchair distance: 25    Wheelchair 50 feet with 2 turns activity    Assist    Wheelchair 50 feet with 2 turns activity did not occur: Safety/medical concerns        Wheelchair 150 feet activity     Assist  Wheelchair 150 feet activity did not occur: Safety/medical concerns       Blood pressure (!) 125/56, pulse (!) 106, temperature 98.2 F (36.8 C), temperature source Oral, resp. rate 18, height 5\' 2"  (1.575 m), weight 95.5 kg, SpO2 96 %.  Medical Problem List and Plan: 1.  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  Antithrombotics: -DVT/anticoagulation:  Mechanical: Sequential compression devices, below knee Bilateral lower extremities              -will need to check to see if can prescribe Lovenox due to increased risk of DVT/PE             -antiplatelet therapy: N/A 3. Pain Management: Continues to be a limiting factor.             3/26- changed oxycodone ot Norco and tylenol prn with robaxin   3/30- will wait on trigger point injections- will switch percocet to Norco since it "makes her talk crazy".   4/1- will add Lyrica 50 mg BID for nerve pain- cont' tylenol  Anxiety component as well 4. Mood: LCSW to follow for evaluation and support.              -antipsychotic agents: N/a 5. Neuropsych: This patient is capable of making decisions on her own behalf.  Will consider Neuropsych consult due to anxiety 6. Skin/Wound Care: Routine pressure relief measures.  7. Fluids/Electrolytes/Nutrition: Monitor I/O.  8. GERD/Dysphagia:   Advanced to D2 thins, continue advance as tolerated  3/29- encourage fluids since Cr up to 1.5 and BUN 34  3/30- stopped HCTZ as of this AM due to Cr/BUN increase- will recheck labs in AM-  9.T2DM: Hgb A1c- 7.0.Will monitor BS ac/hs. Continue Glucotrol and actos.  Changed Glucerna to Ensure max and addED CM restrictions to diet. D/ced lantus--not home med and resume [place].   Added meal coverage tid.    CBG (last 3)  Recent Labs    09/28/19 1712  09/28/19 2116 09/29/19 0613  GLUCAP 106* 88 140*   4/1- great control- con't meds 10. HTN: Monitor BP tid. Continue Lotension/Norvasc and metoprolol.   3/30- stopped HCTZ due to Cr of 1.50- will monitor- controlled so far.    4/1- BP on lower side- will stop Benazepril and increase Norvasc to 10 mg since  Cr so elevated 11.  ABLA: Continue iron supplement.  Hemoglobin 9.6 on 3/26  Continue to monitor 12. Thrombocytopenia: Resolved  Monitor for signs of bleeding.  13. Leucocytosis: Resolved  Monitor for signs of infection.  14. Pre-renal azotemia in setting of AKI on CKD Stage III: Encourage fluid intake.   Creatinine 1.18 on 3/26  3/30- will recheck in AM off HCTZ  4/1- will stop Benazepril and increase Norvasc to 10  mg daily- will also give IVFs x 24 hours  Continue to monitor 15. Morbid Obesity- BMI >40- discuss weight loss in the long term-   Spoke to husband on phone about pt care- he asked about meds and her needs when gets home- I went over d/c date and plans. Spent 34 minutes on care- 20 minutes with pt and 15 minutes on phone with husband   LOS: 7 days A FACE TO FACE EVALUATION WAS PERFORMED  Jamaar Howes 09/29/2019, 10:39 AM

## 2019-09-29 NOTE — Progress Notes (Signed)
Nutrition Follow-up  DOCUMENTATION CODES:   Obesity unspecified  INTERVENTION:  Continue MVI daily  Continue Magic cup TID with meals, each supplement provides 290 kcal and 9 grams of protein  Evening snack daily  NUTRITION DIAGNOSIS:   Increased nutrient needs related to other (see comment)(therapies) as evidenced by estimated needs.  Ongoing.  GOAL:   Patient will meet greater than or equal to 90% of their needs  Progressing.  MONITOR:   PO intake, Supplement acceptance, Skin, Weight trends, Labs, Diet advancement, I & O's  REASON FOR ASSESSMENT:   Malnutrition Screening Tool    ASSESSMENT:   Pt with a PMH significant for DM, HTN, HLD who presented to the ED with worsening numbness and tingling in her extremities and was found to have spinal cord compression due to severe spinal stenosis. Pt underwent surgical decompression on 3/18. Pt [facility] to [facility] 3/25.  4/1 - diet upgraded to DYS3/thins  Discussed pt with RN.   Pt reports appetite is alright depending on what food is available. Pt agreeable to evening snack and requests a sandwich.  PO Intake: 25-90% x last 8 recorded meals (50% average intake)  Admit wt: 95.1 kg Current wt: 95.5 kg  Medications reviewed and include: Vitamin D3, Glucotrol, Novolog, Tradjenta, MVI, Prednisolone, Senokot-S  Labs reviewed: CBGs 88-140   Diet Order:   Diet Order            DIET DYS 3 Room service appropriate? Yes; Fluid consistency: Thin  Diet effective now              EDUCATION NEEDS:   No education needs have been identified at this time  Skin:  Skin Assessment: Skin Integrity Issues: Skin Integrity Issues:: Incisions Incisions: neck  Last BM:  3/29  Height:   Ht Readings from Last 1 Encounters:  09/23/19 5\' 2"  (1.575 m)    Weight:   Wt Readings from Last 1 Encounters:  09/29/19 95.5 kg    BMI:  Body mass index is 38.51 kg/m.  Estimated Nutritional Needs:   Kcal:  1750-2000  Protein:   95-105 grams  Fluid:  >/= 1.8 L/d    Larkin Ina, MS, RD, LDN RD pager number and weekend/on-call pager number located in [place].

## 2019-09-30 ENCOUNTER — Inpatient Hospital Stay (HOSPITAL_COMMUNITY): Payer: Medicare HMO | Admitting: Physical Therapy

## 2019-09-30 ENCOUNTER — Inpatient Hospital Stay (HOSPITAL_COMMUNITY): Payer: Medicare HMO

## 2019-09-30 LAB — BASIC METABOLIC PANEL
Anion gap: 10 (ref 5–15)
BUN: 46 mg/dL — ABNORMAL HIGH (ref 8–23)
CO2: 26 mmol/L (ref 22–32)
Calcium: 8.6 mg/dL — ABNORMAL LOW (ref 8.9–10.3)
Chloride: 106 mmol/L (ref 98–111)
Creatinine, Ser: 1.67 mg/dL — ABNORMAL HIGH (ref 0.44–1.00)
GFR calc Af Amer: 34 mL/min — ABNORMAL LOW (ref >60)
GFR calc non Af Amer: 29 mL/min — ABNORMAL LOW (ref >60)
Glucose, Bld: 109 mg/dL — ABNORMAL HIGH (ref 70–99)
Potassium: 4.2 mmol/L (ref 3.5–5.1)
Sodium: 142 mmol/L (ref 135–145)

## 2019-09-30 LAB — GLUCOSE, CAPILLARY
Glucose-Capillary: 79 mg/dL (ref 70–99)
Glucose-Capillary: 192 mg/dL — ABNORMAL HIGH (ref 70–99)
Glucose-Capillary: 213 mg/dL — ABNORMAL HIGH (ref 70–99)
Glucose-Capillary: 107 mg/dL — ABNORMAL HIGH (ref 70–99)

## 2019-09-30 MED ORDER — LOPERAMIDE HCL 2 MG PO CAPS
2.0000 mg | ORAL_CAPSULE | ORAL | Status: DC | PRN
  Filled 2019-09-30: qty 1

## 2019-09-30 MED ORDER — SENNOSIDES-DOCUSATE SODIUM 8.6-50 MG PO TABS
1.0000 | ORAL_TABLET | Freq: Every day | ORAL | Status: DC
  Administered 2019-10-04 – 2019-10-05 (×2): 1 via ORAL
  Filled 2019-09-30 (×7): qty 1

## 2019-09-30 NOTE — Progress Notes (Signed)
Occupational Therapy Session Note  Patient Details  Name: Doris Jackson MRN: [id number] Date of Birth: [birth year]  Today's Date: 09/30/2019 OT Individual Time: 0800-0900 OT Individual Time Calculation (min): 60 min    Short Term Goals: Week 2:  OT Short Term Goal 1 (Week 2): STG=LTG secondary to ELOS  Skilled Therapeutic Interventions/Progress Updates:  Pt received supine in bed with RN present giving AM meds with pt agreeable OT intervention. Pt declined shower this AM but agreeable to change clothes. Session focus BADLs, increasing standing tolerance for ADL participation and [facility] with LUE. Pt completed bed mobility from supine>sit with CGA needing verbal cues for log roll technique. Total A to don collar, set- up for UB dressing and MAXA for LB dressing to don pants. CGA for sit<>stand to pull pants up to wait line. Pt able to don socks with sock aid with set- up assist. Attempted standing tolerance activity via [place] with pt only able to tolerate standing ~ 15 seconds before needing to sit. Pt reports urgency to defecate; supervision for ambulation to bathroom with RW. Total A for posterior pericare d/t fatigue. Pt returned to recliner in similar fashion as previously indicated. Pt declined further standing activities however agreeable to seated LUE [place] activities. Pt able to [employer] blocks with LUE needing cues to use on LUE during task. Pt requesting to return to supine at end of session with pt needing MODA to elevated BLEs back to supine. Pt left supine in bed with all needs within reach and bed alarm activated.   Therapy Documentation Precautions:  Precautions Precautions: Cervical, Fall Precaution Booklet Issued: Yes (comment) Precaution Comments: reviewd cervial precautions with pt and husband with handout provided Required Braces or Orthoses: Cervical Brace Cervical Brace: Hard collar Restrictions Weight Bearing Restrictions: No General:   Vital Signs:   Pain: Pt  reports pain from stomach cramping during session however pain subsided after BM  Therapy/Group: Individual Therapy  [name] 09/30/2019, 12:07 PM

## 2019-09-30 NOTE — Progress Notes (Signed)
Occupational Therapy Session Note  Patient Details  Name: Doris Jackson MRN: [id number] Date of Birth: 07-04-1940  Today's Date: 09/30/2019 OT Individual Time: 0215-0330 OT Individual Time Calculation (min): 75 min    Short Term Goals: Week 2:  OT Short Term Goal 1 (Week 2): STG=LTG secondary to ELOS  Skilled Therapeutic Interventions/Progress Updates:  Pt received seated in recliner with daughter present agreeable to OT intervention. Pt requested to stay in room during session d/t fatigue and pt reports "I am more comfortable in here." Session focus on standing tolerance/ balance, LUE FMC, functional mobility and IADL tasks. Pt completed various standing tolerance activities with RW with pt able to tolerate standing 3 mins and 30 seconds of standing. Pt instructed to shift weight tp tap stated numbers on floor with heels to facilitate standing balance for higher level ADL tasks. Pt required multimodal cues to complete task. Pt completed standing game of connect four using LUE only to manipulate pieces. Pt able to stand ~ 1 min and 30 seconds before requesting to sit down. Once pt sitting pt reports, " I needed to sit down because I couldn't concentrate." Feel pt likely struggles with divided attention as pt reports she tries to do one thing at a time at home. Pt was able to accurately respond to all community safety questions during session appropriately. Pt completed x2 trials of functional mobility with RW greater than a household distance with CGA. Pt required seated rest breaks after each trial. Attempted standing IADL tasks where pt instructed to fold towels to simulate IADL task at home. Pt stood to fold 1 towel and reports needing to sit, pt folded remainder of towels seated in recliner. Pt left seated in recliner with all needs within reach and chair alarm activated with daughter present.   Therapy Documentation Precautions:  Precautions Precautions: Cervical, Fall Precaution Booklet  Issued: Yes (comment) Precaution Comments: reviewd cervial precautions with pt and husband with handout provided Required Braces or Orthoses: Cervical Brace Cervical Brace: Hard collar Restrictions Weight Bearing Restrictions: No General:   Vital Signs: Therapy Vitals Pulse Rate: 90 Resp: 19 BP: (!) 105/58 Patient Position (if appropriate): Sitting Oxygen Therapy SpO2: 100 % O2 Device: Room Air Pain: Pt reports mild pain in upper back; used rest breaks, repositioning and relaxation as pain mgmt strategies.   Therapy/Group: Individual Therapy  [name] 09/30/2019, 3:55 PM

## 2019-09-30 NOTE — Progress Notes (Signed)
[place] PHYSICAL MEDICINE & REHABILITATION PROGRESS NOTE   Subjective/Complaints:  Pt reports still having tingling/burning in hands/feet- started Lyrica- no side effects currently.   Also c/o stomach hurting- needs to have BM- LBM yesterday- got laxative/stool softener.   On IVFs due to elevated Cr.  Notes was on imodium at home for loose stools due to metformin  ROS:   Pt denies SOB, abd pain, CP, N/V/C/D, and vision changes    Objective:   No results found. Recent Labs    09/28/19 0435  WBC 7.2  HGB 9.1*  HCT 27.6*  PLT 180   Recent Labs    09/28/19 0435 09/30/19 0616  NA 136 142  K 4.0 4.2  CL 99 106  CO2 27 26  GLUCOSE 125* 109*  BUN 49* 46*  CREATININE 2.05* 1.67*  CALCIUM 8.7* 8.6*    Intake/Output Summary (Last 24 hours) at 09/30/2019 1105 Last data filed at 09/30/2019 0503 Gross per 24 hour  Intake 1538.59 ml  Output --  Net A571140.59 ml     Physical Exam: Vital Signs Blood pressure 114/71, pulse 83, temperature 97.8 F (36.6 C), temperature source Oral, resp. rate 18, height 5\' 2"  (1.575 m), weight 96.2 kg, SpO2 98 %.  Gen: awake, alert, needed help to toilet due to needing to have BM, NAD CV: RRR Pulm: CTA B/L GI: soft, ND, slightly TTP; (+) hyperactive BS- right before BM Skin: Vascular changes bilateral lower extremities Psych: slightly anixous Musc: Lower extremity edema; little tight over L rhomboids- better Neuro: Ox3 Motor: Grossly 4 -/5 throughout, unchanged  Assessment/Plan: 1. 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  Physiatrist is providing close team supervision and 24 hour management of active medical problems listed below.  Physiatrist and rehab team continue to assess barriers to  discharge/monitor patient progress toward functional and medical goals  Care Tool:  Bathing    Body parts bathed by patient: Chest, Buttocks, Face, Right arm, Left arm, Abdomen, Front perineal area   Body parts bathed by helper: Right lower leg, Left lower leg, Right arm     Bathing assist Assist Level: Set up assist     Upper Body Dressing/Undressing Upper body dressing   What is the patient wearing?: Pull over shirt    Upper body assist Assist Level: Set up assist    Lower Body Dressing/Undressing Lower body dressing      What is the patient wearing?: Pants     Lower body assist Assist for lower body dressing: Maximal Assistance - Patient 25 - 49%     Toileting Toileting    Toileting assist Assist for toileting: Maximal Assistance - Patient 25 - 49%     Transfers Chair/bed transfer  Transfers assist     Chair/bed transfer assist level: Supervision/Verbal cueing     Locomotion Ambulation   Ambulation assist      Assist level: Supervision/Verbal cueing Assistive device: Walker-rolling Max distance: 90'   Walk 10 feet activity   Assist  Walk 10 feet activity did not occur: Safety/medical concerns(unable without AD; min assist with RW)  Assist level: Supervision/Verbal cueing Assistive device: Walker-rolling   Walk 50 feet activity   Assist Walk 50 feet with 2 turns activity did not occur: Safety/medical concerns  Assist level: Supervision/Verbal cueing Assistive device: Walker-rolling    Walk 150  feet activity   Assist Walk 150 feet activity did not occur: Safety/medical concerns         Walk 10 feet on uneven surface  activity   Assist Walk 10 feet on uneven surfaces activity did not occur: Safety/medical concerns         Wheelchair     Assist Will patient use wheelchair at discharge?: No Type of Wheelchair: Manual    Wheelchair assist level: Supervision/Verbal cueing Max wheelchair distance: 25    Wheelchair  50 feet with 2 turns activity    Assist    Wheelchair 50 feet with 2 turns activity did not occur: Safety/medical concerns       Wheelchair 150 feet activity     Assist  Wheelchair 150 feet activity did not occur: Safety/medical concerns       Blood pressure 114/71, pulse 83, temperature 97.8 F (36.6 C), temperature source Oral, resp. rate 18, height 5\' 2"  (1.575 m), weight 96.2 kg, SpO2 98 %.  Medical Problem List and Plan: 1.  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  Antithrombotics: -DVT/anticoagulation:  Mechanical: Sequential compression devices, below knee Bilateral lower extremities              -will need to check to see if can prescribe Lovenox due to increased risk of DVT/PE             -antiplatelet therapy: N/A 3. Pain Management: Continues to be a limiting factor.  4/1- will add Lyrica 50 mg BID for nerve pain- cont' tylenol  4/2- no side effects  Anxiety component as well 4. Mood: LCSW to follow for evaluation and support.              -antipsychotic agents: N/a 5. Neuropsych: This patient is capable of making decisions on her own behalf.  Will consider Neuropsych consult due to anxiety 6. Skin/Wound Care: Routine pressure relief measures.  7. Fluids/Electrolytes/Nutrition: Monitor I/O.  8. GERD/Dysphagia:   Advanced to D2 thins, continue advance as tolerated  3/29- encourage fluids since Cr up to 1.5 and BUN 34  3/30- stopped HCTZ as of this AM due to Cr/BUN increase- will recheck labs in AM-   4/2- pt happy with D3 thins- doesn't want to change up anymore- likes chopped meats 9.T2DM: Hgb A1c- 7.0.Will monitor BS ac/hs. Continue Glucotrol and actos.  Changed Glucerna to Ensure max and addED CM restrictions to diet. D/ced lantus--not home med and resume [place].   Added meal coverage tid.     CBG (last 3)  Recent Labs    09/29/19 1802 09/29/19 2141 09/30/19 0614  GLUCAP 150* 109* 79   4/2- good control- con't meds 10. HTN: Monitor BP tid. Continue Lotension/Norvasc and metoprolol.   3/30- stopped HCTZ due to Cr of 1.50- will monitor- controlled so far.    4/2- BP 114/71- much improved, not too low- off Benazepril and increased Norvasc; off HCTZ 11.  ABLA: Continue iron supplement.  Hemoglobin 9.6 on 3/26  Continue to monitor 12. Thrombocytopenia: Resolved  Monitor for signs of bleeding.  13. Leucocytosis: Resolved  Monitor for signs of infection.  14. Pre-renal azotemia in setting of AKI on CKD Stage III: Encourage fluid intake.   Creatinine 1.18 on 3/26  3/30- will recheck in AM off HCTZ  4/1- will stop Benazepril and increase Norvasc to 10 mg daily- will also give IVFs  x 24 hours  4/2- Cr down to 1.67 from 2.05 and NUN 46 form 49- will recheck Monday  Continue to monitor 15. Morbid Obesity- BMI >40- discuss weight loss in the long term-   Spoke to husband on phone for 15 minutes today about care- explained kidney issues- he and pt explained need to push fluids since she doesn't drink well- explained needs to drink 4 pitchers of water/day- explained not changing d/c date although therapy said we could due to medical issues so far.  Spent 35 minutes on care total- 15 minutes with pt and 5 minutes on note  LOS: 8 days A FACE TO FACE EVALUATION WAS PERFORMED  Jaqueline Uber 09/30/2019, 11:05 AM

## 2019-09-30 NOTE — Progress Notes (Signed)
Physical Therapy Weekly Progress Note  Patient Details  Name: Doris Jackson MRN: [id number] Date of Birth: [birth year]  Beginning of progress report period: September 23, 2019 End of progress report period: September 30, 2019  Today's Date: 09/30/2019 PT Individual Time: 1100-1155 PT Individual Time Calculation (min): 55 min   Patient has met 3 of 4 short term goals.  Pt has made good progress over the past week and is currently at Supervision level for bed mobility and transfers with RW. Pt is able to ambulate up to 100 ft with RW at Supervision level. Pt does currently require min A for stairs with use of 2 handrails. Pt can be self-limiting at times and needs encouragement for participation during therapy sessions. Pt is on track to meet LTG by d/c next week.  Patient continues to demonstrate the following deficits muscle weakness, decreased cardiorespiratoy endurance and decreased standing balance, decreased postural control and decreased balance strategies and therefore will continue to benefit from skilled PT intervention to increase functional independence with mobility.  Patient progressing toward long term goals..  Continue plan of care.  PT Short Term Goals Week 1:  PT Short Term Goal 1 (Week 1): Pt will be able to perform basic transfers at supervision level PT Short Term Goal 1 - Progress (Week 1): Met PT Short Term Goal 2 (Week 1): Pt will be able to perform bed mobility with supervision PT Short Term Goal 2 - Progress (Week 1): Met PT Short Term Goal 3 (Week 1): Pt will be able to perform stairs with CGA PT Short Term Goal 3 - Progress (Week 1): Progressing toward goal PT Short Term Goal 4 (Week 1): Pt will be able to gait x 50' with CGA PT Short Term Goal 4 - Progress (Week 1): Met Week 2:  PT Short Term Goal 1 (Week 2): =LTG due to ELOS  Skilled Therapeutic Interventions/Progress Updates:    Pt received seated in bed, appears more lethargic this date but agreeable with encouragement  to participate in therapy session. Per RN and OTA pt needs to increase PO fluid intake this date. Encouraged pt throughout session to drink more water and had her drink water between activities. Supine to sit with Supervision with HOB elevated and use of bedrail. Per pt's husband they just purchased a new bed at home that has an adjustable head. Sit to stand with Supervision to RW throughout session. Ambulation 4 x 50 ft with RW at Supervision level, v/c for safety and assist with IV management. Seated ball toss, 4 x 10 reps for BUE endurance training. Education with patient and her husband throughout session with regards to d/c planning, follow up therapies, energy conservation, and how to continue to improve endurance upon d/c home. Pt and her husband receptive to all education. Pt left seated in recliner in room with needs in reach, family present at end of session.  Therapy Documentation Precautions:  Precautions Precautions: Cervical, Fall Precaution Booklet Issued: Yes (comment) Precaution Comments: reviewd cervial precautions with pt and husband with handout provided Required Braces or Orthoses: Cervical Brace Cervical Brace: Hard collar Restrictions Weight Bearing Restrictions: No   Therapy/Group: Individual Therapy   Excell Seltzer, PT, DPT 09/30/2019, 12:08 PM

## 2019-10-01 ENCOUNTER — Inpatient Hospital Stay (HOSPITAL_COMMUNITY): Payer: Medicare HMO | Admitting: Physical Therapy

## 2019-10-01 DIAGNOSIS — E669 Obesity, unspecified: Secondary | ICD-10-CM

## 2019-10-01 DIAGNOSIS — E1169 Type 2 diabetes mellitus with other specified complication: Secondary | ICD-10-CM

## 2019-10-01 LAB — GLUCOSE, CAPILLARY
Glucose-Capillary: 133 mg/dL — ABNORMAL HIGH (ref 70–99)
Glucose-Capillary: 156 mg/dL — ABNORMAL HIGH (ref 70–99)
Glucose-Capillary: 123 mg/dL — ABNORMAL HIGH (ref 70–99)
Glucose-Capillary: 191 mg/dL — ABNORMAL HIGH (ref 70–99)

## 2019-10-01 NOTE — Plan of Care (Signed)
  Problem: Consults Goal: RH SPINAL CORD INJURY PATIENT EDUCATION Description:  See Patient Education module for education specifics.  Outcome: Progressing   Problem: SCI BOWEL ELIMINATION Goal: RH STG MANAGE BOWEL WITH ASSISTANCE Description: STG Manage Bowel with mod I Assistance. Outcome: Progressing Goal: RH STG SCI MANAGE BOWEL PROGRAM W/ASSIST OR AS APPROPRIATE Description: STG SCI Manage bowel program with min assist or as appropriate. Outcome: Progressing   Problem: SCI BLADDER ELIMINATION Goal: RH STG MANAGE BLADDER WITH ASSISTANCE Description: STG Manage Bladder With mod I Assistance Outcome: Progressing   Problem: RH SAFETY Goal: RH STG ADHERE TO SAFETY PRECAUTIONS W/ASSISTANCE/DEVICE Description: STG Adhere to Safety Precautions With cues and reminders Outcome: Progressing   Problem: RH PAIN MANAGEMENT Goal: RH STG PAIN MANAGED AT OR BELOW PT'S PAIN GOAL Description: Pain level less than 4 on scale of 0-10 Outcome: Progressing   Problem: RH KNOWLEDGE DEFICIT SCI Goal: RH STG INCREASE KNOWLEDGE OF SELF CARE AFTER SCI Description: Pt will be able to demonstrate proper precautions to take to prevent falls and complications related to spinal cord injury with mod I assist from family/caregiver. Pt will be able to adhere to medication regimen and dietary modification to better control blood pressure and blood sugar with mod I assist.  Outcome: Progressing

## 2019-10-01 NOTE — Progress Notes (Signed)
[place] PHYSICAL MEDICINE & REHABILITATION PROGRESS NOTE   Subjective/Complaints:  Still having some tingling in hands. lyrica helping somewhat. Otherwise no complaints  ROS: Patient denies fever, rash, sore throat, blurred vision, nausea, vomiting, diarrhea, cough, shortness of breath or chest pain, joint or back pain, headache, or mood change.     Objective:   No results found. No results for input(s): WBC, HGB, HCT, PLT in the last 72 hours. Recent Labs    09/30/19 0616  NA 142  K 4.2  CL 106  CO2 26  GLUCOSE 109*  BUN 46*  CREATININE 1.67*  CALCIUM 8.6*    Intake/Output Summary (Last 24 hours) at 10/01/2019 1220 Last data filed at 10/01/2019 0900 Gross per 24 hour  Intake 1169.6 ml  Output 1 ml  Net 1168.6 ml     Physical Exam: Vital Signs Blood pressure 132/74, pulse 90, temperature 97.6 F (36.4 C), temperature source Oral, resp. rate 18, height 5\' 2"  (1.575 m), weight 95.7 kg, SpO2 97 %.  Constitutional: No distress . Vital signs reviewed. HEENT: EOMI, oral membranes moist Neck: supple Cardiovascular: RRR without murmur. No JVD    Respiratory/Chest: CTA Bilaterally without wheezes or rales. Normal effort    GI/Abdomen: BS +, non-tender, non-distended Ext: no clubbing, cyanosis, or edema Psych: pleasant and a little anxious Skin: Vascular changes bilateral lower extremities   Musc: mild tenderness with shoulder girdle ROM Neuro: Ox3  Motor: Grossly 4-/5 throughout. Stable  Assessment/Plan: 1. 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  Physiatrist is providing close team supervision and 24 hour management of active medical problems listed below.  Physiatrist and rehab team continue to assess barriers to  discharge/monitor patient progress toward functional and medical goals  Care Tool:  Bathing    Body parts bathed by patient: Chest, Buttocks, Face, Right arm, Left arm, Abdomen, Front perineal area   Body parts bathed by helper: Right lower leg, Left lower leg, Right arm     Bathing assist Assist Level: Set up assist     Upper Body Dressing/Undressing Upper body dressing   What is the patient wearing?: Pull over shirt    Upper body assist Assist Level: Set up assist    Lower Body Dressing/Undressing Lower body dressing      What is the patient wearing?: Pants     Lower body assist Assist for lower body dressing: Maximal Assistance - Patient 25 - 49%     Toileting Toileting    Toileting assist Assist for toileting: Maximal Assistance - Patient 25 - 49%     Transfers Chair/bed transfer  Transfers assist     Chair/bed transfer assist level: Supervision/Verbal cueing     Locomotion Ambulation   Ambulation assist      Assist level: Supervision/Verbal cueing Assistive device: Walker-rolling Max distance: 50'   Walk 10 feet activity   Assist  Walk 10 feet activity did not occur: Safety/medical concerns(unable without AD; min assist with RW)  Assist level: Supervision/Verbal cueing Assistive device: Walker-rolling   Walk 50 feet activity   Assist Walk 50 feet with 2 turns activity did not occur: Safety/medical concerns  Assist level: Supervision/Verbal cueing Assistive device: Walker-rolling    Walk 150 feet activity   Assist Walk 150 feet activity did not occur: Safety/medical concerns  Walk 10 feet on uneven surface  activity   Assist Walk 10 feet on uneven surfaces activity did not occur: Safety/medical concerns         Wheelchair     Assist Will patient use wheelchair at discharge?: No Type of Wheelchair: Manual    Wheelchair assist level: Supervision/Verbal cueing Max wheelchair distance: 25    Wheelchair  50 feet with 2 turns activity    Assist    Wheelchair 50 feet with 2 turns activity did not occur: Safety/medical concerns       Wheelchair 150 feet activity     Assist  Wheelchair 150 feet activity did not occur: Safety/medical concerns       Blood pressure 132/74, pulse 90, temperature 97.6 F (36.4 C), temperature source Oral, resp. rate 18, height 5\' 2"  (1.575 m), weight 95.7 kg, SpO2 97 %.  Medical Problem List and Plan: 1.  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  Antithrombotics: -DVT/anticoagulation:  Mechanical: Sequential compression devices, below knee Bilateral lower extremities              -will need to check to see if can prescribe Lovenox due to increased risk of DVT/PE             -antiplatelet therapy: N/A 3. Pain Management: Continues to be a limiting factor.  4/1- added Lyrica 50 mg BID for nerve pain- cont' tylenol  4/2- no side effects  Anxiety component as well  4/3 under improved control 4. Mood: LCSW to follow for evaluation and support.              -antipsychotic agents: N/a 5. Neuropsych: This patient is capable of making decisions on her own behalf.  Will consider Neuropsych consult due to anxiety 6. Skin/Wound Care: Routine pressure relief measures.  7. Fluids/Electrolytes/Nutrition: Monitor I/O.  8. GERD/Dysphagia:   Advanced to D2 thins, continue advance as tolerated  3/29- encourage fluids since Cr up to 1.5 and BUN 34  3/30- stopped HCTZ as of this AM due to Cr/BUN increase- will recheck labs in AM-   4/2- pt happy with D3 thins- doesn't want to change up anymore- likes chopped meats 9.T2DM: Hgb A1c- 7.0.Will monitor BS ac/hs. Continue Glucotrol and actos.  Changed Glucerna to Ensure max and addED CM restrictions to diet. D/ced lantus--not home med and resume [place].    Added meal coverage tid.    CBG (last 3)  Recent Labs    09/30/19 2139 10/01/19 0621 10/01/19 1204  GLUCAP 107* 133* 156*   4/3- reasonable control 10. HTN: Monitor BP tid. Continue Lotension/Norvasc and metoprolol.   3/30- stopped HCTZ due to Cr of 1.50- will monitor- controlled so far.    4/3 BP controlled off Benazepril and increased Norvasc; off HCTZ 11.  ABLA: Continue iron supplement.  Hemoglobin 9.6 on 3/26  Continue to monitor 12. Thrombocytopenia: Resolved  Monitor for signs of bleeding.  13. Leucocytosis: Resolved  Monitor for signs of infection.  14. Pre-renal azotemia in setting of AKI on CKD Stage III: Encourage fluid intake.   Creatinine 1.18 on 3/26  3/30- will recheck in AM off HCTZ  4/1- will stop Benazepril and increase Norvasc to 10 mg daily- will also give IVFs x 24 hours  4/2- Cr down to 1.67 from 2.05 and NUN 46 form 49- will recheck Monday  4/3 pushing fluids 15. Morbid  Obesity- BMI >40- discuss weight loss in the long term-      LOS: 9 days A FACE TO FACE EVALUATION WAS PERFORMED  Meredith Staggers 10/01/2019, 12:20 PM

## 2019-10-01 NOTE — Progress Notes (Signed)
Physical Therapy Session Note  Patient Details  Name: Doris Jackson MRN: [id number] Date of Birth: [birth year]  Today's Date: 10/01/2019 PT Individual Time: 1105-1200 PT Individual Time Calculation (min): 55 min   Short Term Goals: Week 2:  PT Short Term Goal 1 (Week 2): =LTG due to ELOS  Skilled Therapeutic Interventions/Progress Updates:   Pt received supine in bed and agreeable to PT. Supine>sit transfer with supervision assist and min cues for cervical precautions. PT assisted pt to don C-collar sitting EOB. Stand pivot transfer to [facility] with supervision assist and RW  Pt transported to rehab gym in [facility]. Gait training with RW x 54f with supervision assist. Min cues for AD management in turn to sit as well as increased step length as tolerated. Pt noted to be mildly self limiting, stating that she couldn't go any further, but no visual sign of fatigue noted.   Dynamic standing balance/tolerance while engaged in fine motor task of connect 4 x 3 bouts. 2 bouts performed with RUE and 1 bout with LUE. Pt ablew to sustain standing up to 2 min each bout proir to requesting seated rest break. 1-2 UE supported at all times; no LOB noted throughout.   Fine motor coordation to manipulate soft tan thera-putty to roll back and fourth and remove 6 beads with the LUE. Performed x 3 bouts with superivison assist and cues to decrease use of RUE to maximaize neuromotor re-ed for hand.  Patient returned to room and performed stand pivot to recliner with supervision assist. Pt left sitting in recliner with call bell in reach and all needs met.         Therapy Documentation Precautions:  Precautions Precautions: Cervical, Fall Precaution Booklet Issued: Yes (comment) Precaution Comments: reviewd cervial precautions with pt and husband with handout provided Required Braces or Orthoses: Cervical Brace Cervical Brace: Hard collar Restrictions Weight Bearing Restrictions: No  Pain: Pain Assessment Pain  Scale: 0-10 Pain Score: 0-No pain    Therapy/Group: Individual Therapy  ALorie Phenix4/08/2019, 12:02 PM

## 2019-10-02 ENCOUNTER — Inpatient Hospital Stay (HOSPITAL_COMMUNITY): Payer: Medicare HMO

## 2019-10-02 ENCOUNTER — Inpatient Hospital Stay (HOSPITAL_COMMUNITY): Payer: Medicare HMO | Admitting: Physical Therapy

## 2019-10-02 LAB — GLUCOSE, CAPILLARY
Glucose-Capillary: 131 mg/dL — ABNORMAL HIGH (ref 70–99)
Glucose-Capillary: 162 mg/dL — ABNORMAL HIGH (ref 70–99)
Glucose-Capillary: 161 mg/dL — ABNORMAL HIGH (ref 70–99)
Glucose-Capillary: 125 mg/dL — ABNORMAL HIGH (ref 70–99)

## 2019-10-02 NOTE — Progress Notes (Signed)
Occupational Therapy Session Note  Patient Details  Name: Doris Jackson MRN: [id number] Date of Birth: 11-03-1940  Today's Date: 10/02/2019 OT Individual Time: [id number] OT Individual Time Calculation (min): 55 min    Short Term Goals: Week 2:  OT Short Term Goal 1 (Week 2): STG=LTG secondary to ELOS  Skilled Therapeutic Interventions/Progress Updates:    Pt resting in bed upon arrival and agreeable to participating in therapy. Supine>EOB with min A using bed rails. Pt dependent for donning cervical collar.  Pt declined bathing this morning but donned shirt and pants with sit<>stand from EOB. Pt required min A for pulling pants over hips but was able to thread BLE seated EOB.  Pt dependent for donning Ted hose and socks. Sit<>stand from EOB with supervision.  Pt transitioned to gym and engaged in BUE tasks to increase BUE function during BADLs. Pt manipulated clothes pins and placed on metal dowel and removed.  Pt also practiced opening medicine bottles.  Pt with difficulty twisting top.  Pt issued small squares of dycem to use and was able to open all medicine bottles.  Pt appreciative. Pt returned to room and amb with RW from doorway to recliner-supervision. Pt remained in recliner with all needs within reach and seat alarm activated.   Therapy Documentation Precautions:  Precautions Precautions: Cervical, Fall Precaution Booklet Issued: Yes (comment) Precaution Comments: reviewd cervial precautions with pt and husband with handout provided Required Braces or Orthoses: Cervical Brace Cervical Brace: Hard collar Restrictions Weight Bearing Restrictions: No   Pain: Pain Assessment Pain Scale: 0-10 Pain Score: 0-No pain  Therapy/Group: Individual Therapy  Leroy Libman 10/02/2019, 9:59 AM

## 2019-10-02 NOTE — Progress Notes (Signed)
Physical Therapy Session Note  Patient Details  Name: Doris Jackson MRN: [id number] Date of Birth: 05/11/1941  Today's Date: 10/02/2019 PT Individual Time: 1015-1055 PT Individual Time Calculation (min): 40 min   Short Term Goals: Week 2:  PT Short Term Goal 1 (Week 2): =LTG due to ELOS  Skilled Therapeutic Interventions/Progress Updates:    Pt received seated in recliner in room, agreeable to PT session. No complaints of pain. Stand pivot transfer recliner to w/c with RW at Supervision level. Ascend/descend 2 x 2 stairs with 2 handrails at CGA level. Pt exhibits decreased assist needed for stairs this date. Pt continues to decline to perform 4 stairs at once due to decreased endurance. Ambulation 2 x 50 ft with RW at Supervision level, v/c for safe RW management with turning and sitting. Seated BLE strengthening therex x 10 reps with orange theraband: HS curls, hip abd. Pt agreeable to stay seated in recliner at end of session. Stand pivot transfer w/c to recliner with RW at Supervision level. Placed pillow under LUE for improved comfort and support in seated position. Pt left seated in recliner in room with needs in reach, chair alarm in place at end of session.  Therapy Documentation Precautions:  Precautions Precautions: Cervical, Fall Precaution Booklet Issued: Yes (comment) Precaution Comments: reviewd cervial precautions with pt and husband with handout provided Required Braces or Orthoses: Cervical Brace Cervical Brace: Hard collar Restrictions Weight Bearing Restrictions: No    Therapy/Group: Individual Therapy   Excell Seltzer, PT, DPT  10/02/2019, 12:26 PM

## 2019-10-02 NOTE — Progress Notes (Signed)
[place] PHYSICAL MEDICINE & REHABILITATION PROGRESS NOTE   Subjective/Complaints:  No new complaints. Hand pain is under reasonable control with lyrica.   ROS: Patient denies fever, rash, sore throat, blurred vision, nausea, vomiting, diarrhea, cough, shortness of breath or chest pain,   headache, or mood change.   Objective:   No results found. No results for input(s): WBC, HGB, HCT, PLT in the last 72 hours. Recent Labs    09/30/19 0616  NA 142  K 4.2  CL 106  CO2 26  GLUCOSE 109*  BUN 46*  CREATININE 1.67*  CALCIUM 8.6*    Intake/Output Summary (Last 24 hours) at 10/02/2019 1030 Last data filed at 10/01/2019 1300 Gross per 24 hour  Intake 240 ml  Output --  Net 240 ml     Physical Exam: Vital Signs Blood pressure 120/71, pulse 99, temperature 98.5 F (36.9 C), temperature source Oral, resp. rate 18, height 5\' 2"  (1.575 m), weight 95.7 kg, SpO2 100 %.  Constitutional: No distress . Vital signs reviewed. HEENT: EOMI, oral membranes moist Neck: supple Cardiovascular: RRR without murmur. No JVD    Respiratory/Chest: CTA Bilaterally without wheezes or rales. Normal effort    GI/Abdomen: BS +, non-tender, non-distended Ext: no clubbing, cyanosis, or edema Psych: pleasant and cooperative, mildly anxious Skin: Vascular changes bilateral lower extremities   Musc: mild tenderness with shoulder girdle ROM Neuro: Ox3  Motor: Grossly 4-/5 throughout. Stable  Assessment/Plan: 1. 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  Physiatrist is providing close team supervision and 24 hour management of active medical problems listed below.  Physiatrist and rehab team continue to assess barriers to discharge/monitor patient progress toward  functional and medical goals  Care Tool:  Bathing  Bathing activity did not occur: Refused Body parts bathed by patient: Chest, Buttocks, Face, Right arm, Left arm, Abdomen, Front perineal area   Body parts bathed by helper: Right lower leg, Left lower leg, Right arm     Bathing assist Assist Level: Set up assist     Upper Body Dressing/Undressing Upper body dressing   What is the patient wearing?: Pull over shirt, Orthosis    Upper body assist Assist Level: Minimal Assistance - Patient > 75%    Lower Body Dressing/Undressing Lower body dressing      What is the patient wearing?: Pants     Lower body assist Assist for lower body dressing: Minimal Assistance - Patient > 75%     Toileting Toileting    Toileting assist Assist for toileting: Maximal Assistance - Patient 25 - 49%     Transfers Chair/bed transfer  Transfers assist     Chair/bed transfer assist level: Supervision/Verbal cueing     Locomotion Ambulation   Ambulation assist      Assist level: Supervision/Verbal cueing Assistive device: Walker-rolling Max distance: 50'   Walk 10 feet activity   Assist  Walk 10 feet activity did not occur: Safety/medical concerns(unable without AD; min assist with RW)  Assist level: Supervision/Verbal cueing Assistive device: Walker-rolling   Walk 50 feet activity   Assist Walk 50 feet with 2 turns activity did not occur: Safety/medical concerns  Assist level: Supervision/Verbal cueing Assistive device: Walker-rolling    Walk 150 feet activity   Assist Walk 150 feet activity did not occur: Safety/medical concerns  Walk 10 feet on uneven surface  activity   Assist Walk 10 feet on uneven surfaces activity did not occur: Safety/medical concerns         Wheelchair     Assist Will patient use wheelchair at discharge?: No Type of Wheelchair: Manual    Wheelchair assist level: Supervision/Verbal cueing Max wheelchair  distance: 25    Wheelchair 50 feet with 2 turns activity    Assist    Wheelchair 50 feet with 2 turns activity did not occur: Safety/medical concerns       Wheelchair 150 feet activity     Assist  Wheelchair 150 feet activity did not occur: Safety/medical concerns       Blood pressure 120/71, pulse 99, temperature 98.5 F (36.9 C), temperature source Oral, resp. rate 18, height 5\' 2"  (1.575 m), weight 95.7 kg, SpO2 100 %.  Medical Problem List and Plan: 1.  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  Antithrombotics: -DVT/anticoagulation:  Mechanical: Sequential compression devices, below knee Bilateral lower extremities              -will need to check to see if can prescribe Lovenox due to increased risk of DVT/PE             -antiplatelet therapy: N/A 3. Pain Management: Continues to be a limiting factor.  4/1- added Lyrica 50 mg BID for nerve pain- cont' tylenol  4/2- no side effects  Anxiety component as well  4/3-4 under improved control 4. Mood: LCSW to follow for evaluation and support.              -antipsychotic agents: N/a 5. Neuropsych: This patient is capable of making decisions on her own behalf.  Will consider Neuropsych consult due to anxiety 6. Skin/Wound Care: Routine pressure relief measures.  7. Fluids/Electrolytes/Nutrition: Monitor I/O.  8. GERD/Dysphagia:   Advanced to D2 thins, continue advance as tolerated  3/29- encourage fluids since Cr up to 1.5 and BUN 34  3/30- stopped HCTZ as of this AM due to Cr/BUN increase- will recheck labs in AM-   4/2- pt happy with D3 thins- doesn't want to change up anymore- likes chopped meats 9.T2DM: Hgb A1c- 7.0.Will monitor BS ac/hs. Continue Glucotrol and actos.  Changed Glucerna to Ensure max and addED CM restrictions to diet. D/ced lantus--not  home med and resume [place].   Added meal coverage tid.    CBG (last 3)  Recent Labs    10/01/19 1658 10/01/19 2204 10/02/19 0602  GLUCAP 123* 191* 131*   4/4- reasonable control 10. HTN: Monitor BP tid. Continue Lotension/Norvasc and metoprolol.   3/30- stopped HCTZ due to Cr of 1.50- will monitor- controlled so far.    4/3 BP controlled off Benazepril and increased Norvasc; off HCTZ 11.  ABLA: Continue iron supplement.  Hemoglobin 9.6 on 3/26  Continue to monitor 12. Thrombocytopenia: Resolved  Monitor for signs of bleeding.  13. Leucocytosis: Resolved  Monitor for signs of infection.  14. Pre-renal azotemia in setting of AKI on CKD Stage III: Encourage fluid intake.   Creatinine 1.18 on 3/26  3/30- will recheck in AM off HCTZ  4/1- will stop Benazepril and increase Norvasc to 10 mg daily- will also give IVFs x 24 hours  4/2- Cr down to 1.67 from 2.05 and NUN 46 form 49- will recheck Monday  4/3 -4pushing fluids 15. Morbid  Obesity- BMI >40- discuss weight loss in the long term-      LOS: 10 days A FACE TO FACE EVALUATION WAS PERFORMED  Meredith Staggers 10/02/2019, 10:30 AM

## 2019-10-02 NOTE — Progress Notes (Signed)
Occupational Therapy Session Note  Patient Details  Name: Doris Jackson MRN: [id number] Date of Birth: 1940-09-24  Today's Date: 10/02/2019 OT Individual Time: 1300-1330 OT Individual Time Calculation (min): 30 min    Short Term Goals: Week 2:  OT Short Term Goal 1 (Week 2): STG=LTG secondary to ELOS  Skilled Therapeutic Interventions/Progress Updates:    Pt resting in recliner upon arrival. OT intervention with focus on LUE funciton and strengthening.  Table tasks included pincher grasp activity with small foam blocks and theraputty.  General strengthening tasks included theraputty activities rolling into ball, flattening onto table, pincher grasp, rolling into "snake." Pt pleased with progress although she stated that "some days are better then others." Pt remained in relciner with seat alarm activated and all needs within reach. Daughter present.   Therapy Documentation Precautions:  Precautions Precautions: Cervical, Fall Precaution Booklet Issued: Yes (comment) Precaution Comments: reviewd cervial precautions with pt and husband with handout provided Required Braces or Orthoses: Cervical Brace Cervical Brace: Hard collar Restrictions Weight Bearing Restrictions: No    Pain:  Pt denies pain this afternoon   Therapy/Group: Individual Therapy  Leroy Libman 10/02/2019, 2:17 PM

## 2019-10-02 NOTE — Plan of Care (Signed)
  Problem: Consults Goal: RH SPINAL CORD INJURY PATIENT EDUCATION Description:  See Patient Education module for education specifics.  Outcome: Progressing   Problem: SCI BOWEL ELIMINATION Goal: RH STG MANAGE BOWEL WITH ASSISTANCE Description: STG Manage Bowel with mod I Assistance. Outcome: Progressing Goal: RH STG SCI MANAGE BOWEL PROGRAM W/ASSIST OR AS APPROPRIATE Description: STG SCI Manage bowel program with min assist or as appropriate. Outcome: Progressing   Problem: SCI BLADDER ELIMINATION Goal: RH STG MANAGE BLADDER WITH ASSISTANCE Description: STG Manage Bladder With mod I Assistance Outcome: Progressing   Problem: RH SAFETY Goal: RH STG ADHERE TO SAFETY PRECAUTIONS W/ASSISTANCE/DEVICE Description: STG Adhere to Safety Precautions With cues and reminders Outcome: Progressing   Problem: RH PAIN MANAGEMENT Goal: RH STG PAIN MANAGED AT OR BELOW PT'S PAIN GOAL Description: Pain level less than 4 on scale of 0-10 Outcome: Progressing   Problem: RH KNOWLEDGE DEFICIT SCI Goal: RH STG INCREASE KNOWLEDGE OF SELF CARE AFTER SCI Description: Pt will be able to demonstrate proper precautions to take to prevent falls and complications related to spinal cord injury with mod I assist from family/caregiver. Pt will be able to adhere to medication regimen and dietary modification to better control blood pressure and blood sugar with mod I assist.  Outcome: Progressing

## 2019-10-03 ENCOUNTER — Inpatient Hospital Stay (HOSPITAL_COMMUNITY): Payer: Medicare HMO

## 2019-10-03 ENCOUNTER — Inpatient Hospital Stay (HOSPITAL_COMMUNITY): Payer: Medicare HMO | Admitting: Physical Therapy

## 2019-10-03 LAB — BASIC METABOLIC PANEL
Anion gap: 9 (ref 5–15)
BUN: 26 mg/dL — ABNORMAL HIGH (ref 8–23)
CO2: 25 mmol/L (ref 22–32)
Calcium: 8.6 mg/dL — ABNORMAL LOW (ref 8.9–10.3)
Chloride: 108 mmol/L (ref 98–111)
Creatinine, Ser: 1.25 mg/dL — ABNORMAL HIGH (ref 0.44–1.00)
GFR calc Af Amer: 48 mL/min — ABNORMAL LOW (ref >60)
GFR calc non Af Amer: 41 mL/min — ABNORMAL LOW (ref >60)
Glucose, Bld: 131 mg/dL — ABNORMAL HIGH (ref 70–99)
Potassium: 3.9 mmol/L (ref 3.5–5.1)
Sodium: 142 mmol/L (ref 135–145)

## 2019-10-03 LAB — CBC
HCT: 25.7 % — ABNORMAL LOW (ref 36.0–46.0)
Hemoglobin: 8.6 g/dL — ABNORMAL LOW (ref 12.0–15.0)
MCH: 26.8 pg (ref 26.0–34.0)
MCHC: 33.5 g/dL (ref 30.0–36.0)
MCV: 80.1 fL (ref 80.0–100.0)
Platelets: 153 10*3/uL (ref 150–400)
RBC: 3.21 MIL/uL — ABNORMAL LOW (ref 3.87–5.11)
RDW: 16.9 % — ABNORMAL HIGH (ref 11.5–15.5)
WBC: 5.4 10*3/uL (ref 4.0–10.5)
nRBC: 0 % (ref 0.0–0.2)

## 2019-10-03 LAB — GLUCOSE, CAPILLARY
Glucose-Capillary: 126 mg/dL — ABNORMAL HIGH (ref 70–99)
Glucose-Capillary: 148 mg/dL — ABNORMAL HIGH (ref 70–99)
Glucose-Capillary: 170 mg/dL — ABNORMAL HIGH (ref 70–99)
Glucose-Capillary: 147 mg/dL — ABNORMAL HIGH (ref 70–99)

## 2019-10-03 NOTE — Progress Notes (Signed)
Offered patient stool softener/laxative since going on 4 days since last BM.  Patient politely declined.  She says she is "ok" and will let me know if she feels constipated.  Patient reported history of going too much and doesn't want that to happen again.  Brita Romp, RN

## 2019-10-03 NOTE — Progress Notes (Signed)
[place] PHYSICAL MEDICINE & REHABILITATION PROGRESS NOTE   Subjective/Complaints:  Pt reports trying to relax- took tylenol around 4:30 am- Bowels working OK- LBM Thursday wants to take imodium, but explained if hasn't had BM in 4 days, needs to go, not stop herself up.      ROS:  Pt denies SOB, abd pain, CP, N/V/C/D, and vision changes  Objective:   No results found. Recent Labs    10/03/19 0519  WBC 5.4  HGB 8.6*  HCT 25.7*  PLT 153   Recent Labs    10/03/19 0519  NA 142  K 3.9  CL 108  CO2 25  GLUCOSE 131*  BUN 26*  CREATININE 1.25*  CALCIUM 8.6*    Intake/Output Summary (Last 24 hours) at 10/03/2019 1544 Last data filed at 10/03/2019 0800 Gross per 24 hour  Intake 440 ml  Output --  Net 440 ml     Physical Exam: Vital Signs Blood pressure 132/70, pulse 81, temperature (!) 97.4 F (36.3 C), temperature source Oral, resp. rate 17, height 5\' 2"  (1.575 m), weight 95.7 kg, SpO2 100 %.  Constitutional: sitting up in bedside chair, appropriate, NAD HEENT: EOMI, conjugate gaze Neck: supple Cardiovascular:RRR no JVD  Respiratory/Chest:CTA b/L- good air movement  GI/Abdomen: soft, NT, ND, (+)BS Ext: no clubbing, cyanosis, or edema Psych: mild anxiety Skin: Vascular changes bilateral lower extremities   Musc: most TTP over L rhomboids and just next to cervical spine posteriorly Neuro: Ox3 Motor: Grossly 4-/5 throughout. Stable  Assessment/Plan: 1. 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  Physiatrist is providing close team supervision and 24 hour management of active medical problems listed below.  Physiatrist and rehab team continue to assess barriers to discharge/monitor patient progress toward functional and medical  goals  Care Tool:  Bathing  Bathing activity did not occur: Refused Body parts bathed by patient: Right arm, Left arm, Abdomen, Front perineal area, Buttocks, Right upper leg, Left upper leg, Face   Body parts bathed by helper: Right lower leg, Left lower leg, Right arm     Bathing assist Assist Level: Set up assist     Upper Body Dressing/Undressing Upper body dressing   What is the patient wearing?: Pull over shirt, Orthosis    Upper body assist Assist Level: Minimal Assistance - Patient > 75%    Lower Body Dressing/Undressing Lower body dressing      What is the patient wearing?: Incontinence brief, Pants     Lower body assist Assist for lower body dressing: Minimal Assistance - Patient > 75%     Toileting Toileting    Toileting assist Assist for toileting: Supervision/Verbal cueing     Transfers Chair/bed transfer  Transfers assist     Chair/bed transfer assist level: Supervision/Verbal cueing     Locomotion Ambulation   Ambulation assist      Assist level: Supervision/Verbal cueing Assistive device: Walker-rolling Max distance: 100'   Walk 10 feet activity   Assist  Walk 10 feet activity did not occur: Safety/medical concerns(unable without AD; min assist with RW)  Assist level: Supervision/Verbal cueing Assistive device: Walker-rolling   Walk 50 feet activity   Assist Walk 50 feet with 2 turns activity did not occur: Safety/medical concerns  Assist level: Supervision/Verbal cueing Assistive device: Walker-rolling    Walk 150 feet activity  Assist Walk 150 feet activity did not occur: Safety/medical concerns         Walk 10 feet on uneven surface  activity   Assist Walk 10 feet on uneven surfaces activity did not occur: Safety/medical concerns         Wheelchair     Assist Will patient use wheelchair at discharge?: No Type of Wheelchair: Manual    Wheelchair assist level: Supervision/Verbal cueing Max  wheelchair distance: 25    Wheelchair 50 feet with 2 turns activity    Assist    Wheelchair 50 feet with 2 turns activity did not occur: Safety/medical concerns       Wheelchair 150 feet activity     Assist  Wheelchair 150 feet activity did not occur: Safety/medical concerns       Blood pressure 132/70, pulse 81, temperature (!) 97.4 F (36.3 C), temperature source Oral, resp. rate 17, height 5\' 2"  (1.575 m), weight 95.7 kg, SpO2 100 %.  Medical Problem List and Plan: 1.  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  Antithrombotics: -DVT/anticoagulation:  Mechanical: Sequential compression devices, below knee Bilateral lower extremities              -will need to check to see if can prescribe Lovenox due to increased risk of DVT/PE             -antiplatelet therapy: N/A 3. Pain Management: Continues to be a limiting factor.  4/1- added Lyrica 50 mg BID for nerve pain- cont' tylenol  4/5- pain controlled with lyrica and tylenol 4. Mood: LCSW to follow for evaluation and support.              -antipsychotic agents: N/a 5. Neuropsych: This patient is capable of making decisions on her own behalf.  Will consider Neuropsych consult due to anxiety 6. Skin/Wound Care: Routine pressure relief measures.  7. Fluids/Electrolytes/Nutrition: Monitor I/O.  8. GERD/Dysphagia:   Advanced to D2 thins, continue advance as tolerated  3/29- encourage fluids since Cr up to 1.5 and BUN 34  3/30- stopped HCTZ as of this AM due to Cr/BUN increase- will recheck labs in AM-   4/2- pt happy with D3 thins- doesn't want to change up anymore- likes chopped meats 9.T2DM: Hgb A1c- 7.0.Will monitor BS ac/hs. Continue Glucotrol and actos.  Changed Glucerna to Ensure max and addED CM restrictions to diet. D/ced lantus--not home med and resume  [place].   Added meal coverage tid.    CBG (last 3)  Recent Labs    10/02/19 2144 10/03/19 0642 10/03/19 1159  GLUCAP 125* 126* 148*   4/5- good control- con't meds 10. HTN: Monitor BP tid. Continue Lotension/Norvasc and metoprolol.   4/3 BP controlled off Benazepril and increased Norvasc; off HCTZ  4/5- great BP control- con't meds 11.  ABLA: Continue iron supplement.  Hemoglobin 9.6 on 3/26  Continue to monitor 12. Thrombocytopenia: Resolved  Monitor for signs of bleeding.  13. Leucocytosis: Resolved  Monitor for signs of infection.  14. Pre-renal azotemia in setting of AKI on CKD Stage III: Encourage fluid intake.   Creatinine 1.18 on 3/26  3/30- will recheck in AM off HCTZ  4/1- will stop Benazepril and increase Norvasc to 10 mg daily- will also give IVFs x 24 hours  4/2- Cr down to 1.67 from 2.05 and NUN 46 form 49- will recheck Monday  4/5- Pt's BUN down to 26 and Cr 1.25- con't off BP meds and encourage fluids.  15. Morbid Obesity- BMI >40- discuss weight loss in the long term- 16/ Constipation  4/5- LBM 4/2 in AM x2 Friday- lqiuidy- will keep off imodum- stopped and see if pt can have BM.       LOS: 11 days A FACE TO FACE EVALUATION WAS PERFORMED  Azavion Bouillon 10/03/2019, 3:44 PM

## 2019-10-03 NOTE — Progress Notes (Signed)
Social Work Assessment and Plan   Patient Details  Name: Doris Jackson MRN: [id number] Date of Birth: [birth year]  Today's Date: 10/03/2019  Problem List:  Patient Active Problem List   Diagnosis Date Noted  . Anxiety state   . Acute blood loss anemia   . Labile blood glucose   . Dysphagia   . Postoperative pain   . Cervical myelopathy ([place]) 09/22/2019  . S/P cervical spinal fusion 09/22/2019  . 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 AKI (acute kidney injury) ([place]) 09/14/2019  . GERD (gastroesophageal reflux disease) 09/14/2019  . Spinal stenosis in cervical region 09/14/2019  . Weakness 09/14/2019  . Numbness and tingling 09/14/2019  . Cervical spinal stenosis 09/14/2019  . Lumbar stenosis 08/24/2019  . Obesity, morbid, BMI 40.0-49.9 ([place]) 04/19/2018  . Cataract   . Fibroid   . Endometriosis   . DM (diabetes mellitus) type II controlled with renal manifestation ([place]) 06/17/2010  . Dyslipidemia 06/17/2010  . Microcytic anemia 06/17/2010  . Essential hypertension 06/17/2010  . Allergic rhinitis 06/17/2010  . Osteoarthritis 06/17/2010  . LOW BACK PAIN 06/17/2010  . OSTEOPENIA 06/17/2010  . BREAST CANCER, HX OF 06/17/2010   Past Medical History:  Past Medical History:  Diagnosis Date  . ALLERGIC RHINITIS 06/17/2010  . ANEMIA-NOS 06/17/2010  . BREAST CANCER, HX OF 06/17/2010  . Cancer [facility]) 2008   right breast  . Cataract   . COPD 06/17/2010  . DIABETES MELLITUS, TYPE II 06/17/2010  . Endometriosis   . Fibroid    FIBROID  . HYPERLIPIDEMIA 06/17/2010  . HYPERTENSION 06/17/2010  . Leg swelling   . LOW BACK PAIN 06/17/2010  . OSTEOARTHRITIS 06/17/2010  . OSTEOPENIA 06/17/2010  . Weakness    Past Surgical History:  Past Surgical History:  Procedure Laterality Date  . ABDOMINAL HYSTERECTOMY  1988   TAH,LSO  . ANTERIOR CERVICAL DECOMP/DISCECTOMY FUSION N/A 09/15/2019   Procedure: Cervical five CorpectomyANTERIOR CERVICAL  DECOMPRESSION/DISCECTOMY FUSION CERVICAL THREE-CERVICAL Four;  Surgeon: Ashok Pall, MD;  Location: [place];  Service: Neurosurgery;  Laterality: N/A;  . BREAST LUMPECTOMY  2007   LUMPECTOMY FOLLOWED BY RADIATION  . OOPHORECTOMY  1988   TAH,LSO  . TUBAL LIGATION     Social History:  reports that she has never smoked. She has never used smokeless tobacco. She reports that she does not drink alcohol or use drugs.  Family / Support Systems Marital Status: Married How Long?: 38 years Patient Roles: Spouse, Parent Spouse/Significant Other: Doris Jackson (husband): [phone] Children: 3 children; 2 living children (11 y.o. 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  Social History Preferred language: English Religion: Holiness Cultural Background: Pt has worked in Corporate treasurer since 1985. Education: high school grad Read: Yes Write: Yes Employment Status: Retired Date Retired/Disabled/Unemployed: March 2021 Age Retired: 78 Public relations account executive Issues: Denies Guardian/Conservator: N/A   Abuse/Neglect Abuse/Neglect Assessment Can Be Completed: Yes Physical Abuse: Denies Verbal Abuse: Denies Sexual Abuse: Denies Exploitation of patient/patient's resources: Denies Self-Neglect: Denies  Emotional Status Pt's affect, behavior and adjustment status: Pt appears to to understand her condition. Pt remains positive and in good spirits about recovery. Recent Psychosocial Issues: Denies Psychiatric History: Denies Substance Abuse History: [place]  Patient / Family Perceptions, Expectations & Goals Pt/Family understanding of illness & functional limitations: Pt  and family have a general understanding of her medical condition Premorbid pt/family roles/activities: Pt was independent Anticipated changes in roles/activities/participation: Assistance wtih ADLs/IADLs Pt/family expectations/goals: Pt goal is to walk  [employer]: None Premorbid Home Care/DME Agencies: None Transportation available at discharge: Husband to d/c home  Discharge Planning Living Arrangements: Spouse/significant other [place]: Spouse/significant other, Children Type of Residence: Private residence Insurance Resources: Multimedia programmer (specify)([employer] Medicare) Financial Resources: [place] Referred: No Living Expenses: Own Money Management: Patient, Spouse Does the patient have any problems obtaining your medications?: No [place] Barriers to Discharge: Decreased caregiver support, Lack of/limited family support Social Work Anticipated Follow Up Needs: HH/OP  Clinical Impression SW met with pt in room at bedside to introduce self, explain role, and discuss discharge process. Pt is Patent attorney. No HCPOA on file. DME: w/c, showerchair, and cane.   Rana Snare 10/03/2019, 2:37 PM

## 2019-10-03 NOTE — Progress Notes (Signed)
Occupational Therapy Session Note  Patient Details  Name: Doris Jackson MRN: [id number] Date of Birth: 01/20/1941  Today's Date: 10/03/2019 OT Individual Time: [id number] OT Individual Time Calculation (min): 75 min    Short Term Goals: Week 2:  OT Short Term Goal 1 (Week 2): STG=LTG secondary to ELOS  Skilled Therapeutic Interventions/Progress Updates:  Pt received supine in bed agreeable to OT intervention. Supervision for bed mobility needing verbal cues to carryover log roll technique, total A to don cervical collar EOB. Supervision for functional mobility from EOB to bathroom with RW. Pt wanting to complete "wash up" seated on toilet. Set- up assist for UB/LB bathing. MIN A for UB/LB dressing only needing assist to pull pants up to waist line and to get head of shirt over cervical collar. Pt ambulated to sink with RW with supervision to complete seated grooming tasks. Supervision for seated oral care. Pt able to don socks with sockaid and total A to don fresh TED hose. Pt transported to therapy gym with total A for energy conservation where pt completed seated [place] therapetutic actiivty to facilitate increased [facility] for ADL participation. Pt transported to ADL apartment with total A as ECS. Pt completed simulated meal prep task where pt able to retrieve food item from freezer,transport to microwave with RW and gather plate, drink and cup with RW and close supervision. Pt required x3 seated rest breaks during task. Education provided on ECS to incorporate into IADL routine as well maintaining cervical precautions during IADLs. Pt continues to decline practicing tub transfer with OTA. Pt states, "I don't feel like stepping over a tub right now." Provided education on practicing at least once with OT before DC, however pt states she feels like HH can assist with task. Will continue efforts to practice transfer before DC. Pt transported back to room in w/c with total A. Pt agreeable to sit up in recliner  until next session; supervision for stand pivot transfer to recliner with RW. Pt left seated in recliner working on theraputty as requested by pt with chair alarm activated, legs elevated and all needs within reach.   Therapy Documentation Precautions:  Precautions Precautions: Cervical, Fall Precaution Booklet Issued: Yes (comment) Precaution Comments: reviewd cervial precautions with pt and husband with handout provided Required Braces or Orthoses: Cervical Brace Cervical Brace: Hard collar Restrictions Weight Bearing Restrictions: No General:   Vital Signs:   Pain: Pt reports no pain during session.   Therapy/Group: Individual Therapy  [name] 10/03/2019, 10:56 AM

## 2019-10-03 NOTE — Plan of Care (Signed)
  Problem: Consults Goal: RH SPINAL CORD INJURY PATIENT EDUCATION Description:  See Patient Education module for education specifics.  Outcome: Progressing   Problem: SCI BOWEL ELIMINATION Goal: RH STG MANAGE BOWEL WITH ASSISTANCE Description: STG Manage Bowel with mod I Assistance. Outcome: Progressing Goal: RH STG SCI MANAGE BOWEL PROGRAM W/ASSIST OR AS APPROPRIATE Description: STG SCI Manage bowel program with min assist or as appropriate. Outcome: Progressing   Problem: SCI BLADDER ELIMINATION Goal: RH STG MANAGE BLADDER WITH ASSISTANCE Description: STG Manage Bladder With mod I Assistance Outcome: Progressing   Problem: RH SAFETY Goal: RH STG ADHERE TO SAFETY PRECAUTIONS W/ASSISTANCE/DEVICE Description: STG Adhere to Safety Precautions With cues and reminders Outcome: Progressing   Problem: RH PAIN MANAGEMENT Goal: RH STG PAIN MANAGED AT OR BELOW PT'S PAIN GOAL Description: Pain level less than 4 on scale of 0-10 Outcome: Progressing   Problem: RH KNOWLEDGE DEFICIT SCI Goal: RH STG INCREASE KNOWLEDGE OF SELF CARE AFTER SCI Description: Pt will be able to demonstrate proper precautions to take to prevent falls and complications related to spinal cord injury with mod I assist from family/caregiver. Pt will be able to adhere to medication regimen and dietary modification to better control blood pressure and blood sugar with mod I assist.  Outcome: Progressing

## 2019-10-03 NOTE — Progress Notes (Signed)
Physical Therapy Session Note  Patient Details  Name: Doris Jackson MRN: [id number] Date of Birth: 1940-10-17  Today's Date: 10/03/2019 PT Individual Time: 1100-1155; 1300-1400 PT Individual Time Calculation (min): 55 min and 60 min  Short Term Goals: Week 2:  PT Short Term Goal 1 (Week 2): =LTG due to ELOS  Skilled Therapeutic Interventions/Progress Updates:    Session 1: Pt received seated in recliner in room, agreeable to PT session. No complaints of pain. Stand pivot transfer recliner to w/c with RW at Supervision level. Ambulation x 100 ft with RW at Supervision level, increase in path deviation with onset of fatigue but overall improved balance. Trial gait with no AD and intermittent use of handrail in hallway with min to mod A. Pt exhibits ataxic gait and decreased balance without UE support during gait as well as uncontrolled gait speed at times. Recommending continued use of RW for safety and independence with functional mobility. Simulation of pt's home environment bed transfer with use of 6" step stool. Per pt report her bed at home may be too tall to back up to and she plans on using a step stool. Once setup of this transfer done pt then has onset of anxiety and unable to safely back up and step onto stool to sit on the bed. Pt is able to sit on the elevated bed height (26") but unable to get BLE into the bed without assist. Per pt report she may not use a stool at home until she feels comfortable doing so but will need assist in/out of bed. Nustep level 3 x 10 min with use of B UE/LE for global endurance training. Pt exhibits increased endurance with Nustep performance this date. Stand pivot transfer back to recliner with RW at Supervision level. Pt left seated in recliner in room with needs in reach at end of session.  Session 2: Pt received seated in recliner in room, agreeable to PT session. No complaints of pain at beginning of session. Pt does have onset of soreness in L lateral neck  muscles at end of session, provided hot pack for pain relief. Sit to stand with Supervision to RW throughout session. Stand pivot transfer recliner to w/c with RW at Supervision level. Seated BUE fine motor coordination and pinch/grasp task placing level red and blue clothespins along a bar, focus on alternating between L and RUE. Seated balance EOM performing "zoom ball" task with use of BUE for shoulder abduction and extension as well as and elbow extension to pass ball back and forth with therapist, 2 x 10 reps. Reviewed HEP handout: seated BLE strengthening therex with use of orange theraband: marches, LAQ, HS curls, hip abd, hip add squeeze x 10 reps each. Pt demos good understanding and performance of HEP. Sidesteps L/R 2 x 10 ft with RW and CGA for B hip strengthening. Toilet transfer with Supervision, assist for clothing management and pericare. Pt requests to return to bed at end of session. Sit to supine mod A for BLE management. Pt left semi-reclined in bed with needs in reach, bed alarm in place, hot pack to L lateral neck musculature.  Therapy Documentation Precautions:  Precautions Precautions: Cervical, Fall Precaution Booklet Issued: Yes (comment) Precaution Comments: reviewd cervial precautions with pt and husband with handout provided Required Braces or Orthoses: Cervical Brace Cervical Brace: Hard collar Restrictions Weight Bearing Restrictions: No    Therapy/Group: Individual Therapy   Excell Seltzer, PT, DPT  10/03/2019, 12:40 PM

## 2019-10-04 ENCOUNTER — Inpatient Hospital Stay (HOSPITAL_COMMUNITY): Payer: Medicare HMO

## 2019-10-04 ENCOUNTER — Inpatient Hospital Stay (HOSPITAL_COMMUNITY): Payer: Medicare HMO | Admitting: Physical Therapy

## 2019-10-04 LAB — GLUCOSE, CAPILLARY
Glucose-Capillary: 154 mg/dL — ABNORMAL HIGH (ref 70–99)
Glucose-Capillary: 140 mg/dL — ABNORMAL HIGH (ref 70–99)
Glucose-Capillary: 108 mg/dL — ABNORMAL HIGH (ref 70–99)
Glucose-Capillary: 162 mg/dL — ABNORMAL HIGH (ref 70–99)

## 2019-10-04 NOTE — Progress Notes (Signed)
Social Work Patient ID: Doris Jackson, female   DOB: 03-Sep-1940, 79 y.o.   MRN: [id number]   SW met with pt in room to provide updates from team conference. SW discussed recommended DME: RW and TTB, HHPT/OT. Pt intends to private pay for TTB. SW to order RW. Preferred [facility]- [place]. Pt requested [place] aide if possible.   HHPT/OT/CNA accepted by [facility] [phone]). SW faxed order to [facility] [phone]. SW sent DME order RW to [place] via parachute.   Loralee Pacas, MSW, [place] Office: [phone] Cell: [phone] Fax: [phone]

## 2019-10-04 NOTE — Progress Notes (Signed)
[place] PHYSICAL MEDICINE & REHABILITATION PROGRESS NOTE   Subjective/Complaints:   Pt declining all bowel meds in spite of no BM in 5 days- needs to have BM, but will discuss with pt again. Refusing Lyrica for nerve pain.  Been drinking water and voiding q2 hours- had accident yesterday- upset about due to bladder.  Little anxiety "stuck in bed" yesterday- prayed with friend and felt better.  Husband concerned over dark spots on back.  Wants IV out.   ROS:   Pt denies SOB, abd pain, CP, N/V/C/D, and vision changes   Objective:   No results found. Recent Labs    10/03/19 0519  WBC 5.4  HGB 8.6*  HCT 25.7*  PLT 153   Recent Labs    10/03/19 0519  NA 142  K 3.9  CL 108  CO2 25  GLUCOSE 131*  BUN 26*  CREATININE 1.25*  CALCIUM 8.6*    Intake/Output Summary (Last 24 hours) at 10/04/2019 0849 Last data filed at 10/04/2019 0134 Gross per 24 hour  Intake 480 ml  Output 2 ml  Net 478 ml     Physical Exam: Vital Signs Blood pressure 131/68, pulse 97, temperature 98.3 F (36.8 C), temperature source Oral, resp. rate 19, height '5\' 2"'  (1.575 m), weight 99.3 kg, SpO2 100 %.  Constitutional: transferred to w/c using RW from toilet with OT, NAD HEENT: EOMI, conjugate gaze Neck: supple Cardiovascular:no JVD Respiratory/Chest:good air movement; no resp distress  GI/Abdomen: sstill soft, NT, slightly distended- hypoactive BS Ext: no clubbing, cyanosis, or edema Psych: mild anxiety Skin: Vascular changes bilateral lower extremities  also has a myriad of dark "age spots" all over back seen- nothing concerning; IV in L wrist Musc: most TTP over L rhomboids and just next to cervical spine posteriorly  Neuro: Ox3 Motor: Grossly 4-/5 throughout. Stable  Assessment/Plan: 1. 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  Physiatrist is providing close team supervision and 24 hour management of active medical problems listed below.  Physiatrist and rehab team continue to assess barriers to discharge/monitor patient progress toward functional and medical goals  Care Tool:  Bathing  Bathing activity did not occur: Refused Body parts bathed by patient: Right arm, Left arm, Abdomen, Front perineal area, Buttocks, Right upper leg, Left upper leg, Face   Body parts bathed by helper: Right lower leg, Left lower leg, Right arm     Bathing assist Assist Level: Set up assist     Upper Body Dressing/Undressing Upper body dressing   What is the patient wearing?: Pull over shirt, Orthosis    Upper body assist Assist Level: Minimal Assistance - Patient > 75%    Lower Body Dressing/Undressing Lower body dressing      What is the patient wearing?: Incontinence brief, Pants     Lower body assist Assist for lower body dressing: Minimal Assistance - Patient > 75%     Toileting Toileting    Toileting assist Assist for toileting: Supervision/Verbal cueing     Transfers Chair/bed transfer  Transfers assist     Chair/bed transfer assist level: Supervision/Verbal cueing     Locomotion Ambulation   Ambulation assist      Assist level: Supervision/Verbal cueing Assistive device: Walker-rolling Max distance: 100'   Walk 10 feet activity   Assist  Walk 10 feet activity  did not occur: Safety/medical concerns(unable without AD; min assist with RW)  Assist level: Supervision/Verbal cueing Assistive device: Walker-rolling   Walk 50 feet activity   Assist Walk 50 feet with 2 turns activity did not occur: Safety/medical concerns  Assist level: Supervision/Verbal cueing Assistive device: Walker-rolling    Walk 150 feet activity   Assist Walk 150 feet activity did not occur: Safety/medical concerns          Walk 10 feet on uneven surface  activity   Assist Walk 10 feet on uneven surfaces activity did not occur: Safety/medical concerns         Wheelchair     Assist Will patient use wheelchair at discharge?: No Type of Wheelchair: Manual    Wheelchair assist level: Supervision/Verbal cueing Max wheelchair distance: 25    Wheelchair 50 feet with 2 turns activity    Assist    Wheelchair 50 feet with 2 turns activity did not occur: Safety/medical concerns       Wheelchair 150 feet activity     Assist  Wheelchair 150 feet activity did not occur: Safety/medical concerns       Blood pressure 131/68, pulse 97, temperature 98.3 F (36.8 C), temperature source Oral, resp. rate 19, height '5\' 2"'  (1.575 m), weight 99.3 kg, SpO2 100 %.  Medical Problem List and Plan: 1.  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  Antithrombotics: -DVT/anticoagulation:  Mechanical: Sequential compression devices, below knee Bilateral lower extremities              -will need to check to see if can prescribe Lovenox due to increased risk of DVT/PE             -antiplatelet therapy: N/A 3. Pain Management: Continues to be a limiting factor.  4/1- added Lyrica 50 mg BID for nerve pain- cont' tylenol  4/5- pain controlled with lyrica and tylenol  4/6- has been refusing lyrica- will d/c; con't tylenol 4. Mood: LCSW to follow for evaluation and support.              -antipsychotic agents: N/a 5. Neuropsych: This patient is capable of making decisions on her own behalf.  Will consider Neuropsych consult due to anxiety 6. Skin/Wound Care: Routine pressure relief measures.  7. Fluids/Electrolytes/Nutrition: Monitor I/O.  8. GERD/Dysphagia:   Advanced to D2 thins, continue advance as tolerated  3/29-  encourage fluids since Cr up to 1.5 and BUN 34  3/30- stopped HCTZ as of this AM due to Cr/BUN increase- will recheck labs in AM-   4/2- pt happy with D3 thins- doesn't want to change up anymore- likes chopped meats 9.T2DM: Hgb A1c- 7.0.Will monitor BS ac/hs. Continue Glucotrol and actos.  Changed Glucerna to Ensure max and addED CM restrictions to diet. D/ced lantus--not home med and resume [place].   Added meal coverage tid.    CBG (last 3)  Recent Labs    10/03/19 1703 10/03/19 2105 10/04/19 0623  GLUCAP 170* 147* 154*   4/6- good control- con't regimen 10. HTN: Monitor BP tid. Continue Lotension/Norvasc and metoprolol.   4/3 BP controlled off Benazepril and increased Norvasc; off HCTZ  4/5- great BP control- con't meds 11.  ABLA: Continue iron supplement.  Hemoglobin 9.6 on 3/26  Continue to monitor 12. Thrombocytopenia: Resolved  Monitor for signs of bleeding.  13. Leucocytosis: Resolved  Monitor for signs of infection.  14. Pre-renal azotemia in setting of AKI on CKD Stage III: Encourage fluid intake.   Creatinine 1.18 on 3/26  3/30- will recheck in AM off HCTZ  4/1- will stop Benazepril and increase Norvasc to 10 mg daily- will also give IVFs x 24 hours  4/2- Cr down to 1.67 from 2.05 and NUN 46 form 49- will recheck Monday  4/5- Pt's BUN down to 26 and Cr 1.25- con't off BP meds and encourage fluids.  15. Morbid Obesity- BMI >40- discuss weight loss in the long term- 16/ Constipation  4/5- LBM 4/2 in AM x2 Friday- lqiuidy- will keep off imodum- stopped and see if pt can have BM.    4/6- no BM since Friday AM- is Tuesday- will strongly encourage pt to take bowel meds- is scared of loose stools.       LOS: 12 days A FACE TO FACE EVALUATION WAS PERFORMED  Vitaliy Eisenhour 10/04/2019, 8:49 AM

## 2019-10-04 NOTE — Progress Notes (Signed)
C/O feeling funny, feels related to anxiety. Reports incontinent after episode. Reports this happening occasionally PTA. Vitals WNL Continues to refuse LOC. Refusing lyric. Denies pain. Doris Jackson

## 2019-10-04 NOTE — Progress Notes (Signed)
Occupational Therapy Session Note  Patient Details  Name: Doris Jackson MRN: [id number] Date of Birth: Sep 08, 1940  Today's Date: 10/04/2019 OT Individual Time: 0800-0900 OT Individual Time Calculation (min): 60 min    Short Term Goals: Week 2:  OT Short Term Goal 1 (Week 2): STG=LTG secondary to ELOS  Skilled Therapeutic Interventions/Progress Updates:  Pt received supine in bed reporting increased anxiety. Spent copious time providing emotional support and coping strategies to assist with managing anxiety. Offered neuropsychology or chaplain to come by and speak with pt, however pt declining support. Pt completed bed mobility with supervision, total A to don collar. Pt completed functional mobility from EOB>bathroom with RW and close supervision. Pt requesting to just wash up on toilet although OTA provided MAX encouragement to complete full shower, however pt still declined. Set- up assist for UB/LB bathing. MIN A for UB dressing to get neck of shirt over collar and MIN A for LB dressing to pull pants up to waist line. Pt transported to ADL apartment to practice tub transfer in w/c with total A. Pt reports stepping over tub at home prior to admission and sitting in shower chair however pt reports feeling "nervous" about practicing stepping over tub, therefore pt agreeable to practice transfer to TTB with pt needing MINA to elevate BLEs into tub with use of grab bar. Education provided on suction grab bars for home if needed; issued pt handout of where to purchase.  Pt transported back to room with total A in w/c for time mgmt.Pt completed household distance functional mobility to recliner with RW where pt was left seated in recliner with legs elevated, chair alarm activated and all needs within reach.   Therapy Documentation Precautions:  Precautions Precautions: Cervical, Fall Precaution Booklet Issued: Yes (comment) Precaution Comments: reviewd cervial precautions with pt and husband with  handout provided Required Braces or Orthoses: Cervical Brace Cervical Brace: Hard collar Restrictions Weight Bearing Restrictions: No General:   Vital Signs:   Pain: Pt reports no pain during session.    Therapy/Group: Individual Therapy  [name] 10/04/2019, 11:02 AM

## 2019-10-04 NOTE — Patient Care Conference (Signed)
Inpatient RehabilitationTeam Conference and Plan of Care Update Date: 10/04/2019   Time: 11:10 AM   Patient Name: Doris Jackson      Medical Record Number: [id number]  Date of Birth: [birth year] Sex: Female         Room/Bed: 4W26C/4W26C-01 Payor Info: Payor: [employer]alling MEDICARE / Plan: [employer] MEDICARE HMO/PPO / Product Type: *No Product type* /    Admit Date/Time:  09/22/2019  4:22 PM  Primary Diagnosis:  Cervical myelopathy [facility])  Patient Active Problem List   Diagnosis Date Noted  . Anxiety state   . Acute blood loss anemia   . Labile blood glucose   . Dysphagia   . Postoperative pain   . Cervical myelopathy ([place]) 09/22/2019  . S/P cervical spinal fusion 09/22/2019  . 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 AKI (acute kidney injury) ([place]) 09/14/2019  . GERD (gastroesophageal reflux disease) 09/14/2019  . Spinal stenosis in cervical region 09/14/2019  . Weakness 09/14/2019  . Numbness and tingling 09/14/2019  . Cervical spinal stenosis 09/14/2019  . Lumbar stenosis 08/24/2019  . Obesity, morbid, BMI 40.0-49.9 ([place]) 04/19/2018  . Cataract   . Fibroid   . Endometriosis   . DM (diabetes mellitus) type II controlled with renal manifestation ([place]) 06/17/2010  . Dyslipidemia 06/17/2010  . Microcytic anemia 06/17/2010  . Essential hypertension 06/17/2010  . Allergic rhinitis 06/17/2010  . Osteoarthritis 06/17/2010  . LOW BACK PAIN 06/17/2010  . OSTEOPENIA 06/17/2010  . BREAST CANCER, HX OF 06/17/2010    Expected Discharge Date: Expected Discharge Date: 10/07/19  Team Members Present: Physician leading conference: Dr. Courtney Heys Care Coodinator Present: Karene Fry, RN, [place], [place] Nurse Present: Ellison Carwin, LPN PT Present: Excell Seltzer, PT OT Present: Willeen Cass, OT;Roanna Epley, COTA     Current Status/Progress Goal Weekly Team Focus  Bowel/Bladder   usually continent of urine, occasionally incontinent. Continent BM.  No BM x 4 days, refused PRN LOC  continent of b/b  toilet patient every 3 hours.   Swallow/Nutrition/ Hydration             ADL's   set- up assist for bathing at sink; [place] for LB dressing ( uses sock aid), MIN A for UB dressing, supervision for functional mobility and sit<>stand with RW, toilet transfers- supervision  supervision overall  shower transfer, acitivty tolerance/ endurace, BADL retraining, LUE FMC and education on ECS to incorporate into ADL routine   Mobility   min to mod A bed mobility, S sit to stand and SPT with RW, gait up to 100 ft with RW Supervision, CGA 2 stairs with 2 handrails  mod I overall, Supervision stairs  endurance, balance, bed mobility   Communication             Safety/Cognition/ Behavioral Observations            Pain   C/O neck pain, pain managed with PRN tylenol, not using norco, refusing lyrica  pain level <4 on pain scale  assess pain every 4 hours   Skin              Rehab Goals Patient on target to meet rehab goals: Yes *See Care Plan and progress notes for long and short-term goals.     Barriers to Discharge  Current Status/Progress Possible Resolutions Date Resolved   Nursing                  PT  Decreased caregiver support  pt may only have  intermittent support at home              OT                  SLP                SW Decreased caregiver support;Lack of/limited family support              Discharge Planning/Teaching Needs:  D/c to home with 24/7 care from husband. Pt has a dtr that will do intermittent check-ins atleast once per day  Family education as recommended by therapy   Team Discussion: MD no BM since 4/2, monitoring labs, lyrica for nerve pain.  RN refusing bowel meds, soft collar in place, up in chair, cont B/B, did have inc episode last night.  PT nerve pain yesterday, S transfers with walker, 100' S, CGA stairs.  OT set up A bathing, refused shower, UB D min A, LB D min A, S with RW.   Revisions to Treatment  Plan: N/A     Medical Summary Current Status: LBM 4/2- needs to go- denies pain this AM- usually continent- Weekly Focus/Goal: PT-nerve pain yesterday- wary of meds- supervision transfers; 126ft RW; CGA stairs; self limits sometimes  Barriers to Discharge: Medical stability;Home enviroment access/layout;Decreased family/caregiver support;Weight bearing restrictions;Weight   Possible Resolutions to Barriers: OT- s/u bathing at sink; declines shower; CGA for shower transfer; UB min assist; LB min assist- uses sock aid; Sup RW for transfers   Continued Need for Acute Rehabilitation Level of Care: The patient requires daily medical management by a physician with specialized training in physical medicine and rehabilitation for the following reasons: Direction of a multidisciplinary physical rehabilitation program to maximize functional independence : Yes Medical management of patient stability for increased activity during participation in an intensive rehabilitation regime.: Yes Analysis of laboratory values and/or radiology reports with any subsequent need for medication adjustment and/or medical intervention. : Yes   I attest that I was present, lead the team conference, and concur with the assessment and plan of the team.   Retta Diones 10/04/2019, 7:38 PM   Team conference was held via web/ teleconference due to [name] - 19

## 2019-10-04 NOTE — Progress Notes (Signed)
Physical Therapy Session Note  Patient Details  Name: Doris Jackson MRN: [id number] Date of Birth: [birth year]  Today's Date: 10/04/2019 PT Individual Time: 1300-1415 PT Individual Time Calculation (min): 75 min   Short Term Goals: Week 2:  PT Short Term Goal 1 (Week 2): =LTG due to ELOS  Skilled Therapeutic Interventions/Progress Updates:    Pt received seated in recliner in room, agreeable to PT session. No complaints of pain. Pt reports urge to urinate. Sit to stand with Supervision to RW. Ambulation into bathroom with RW at Supervision level. Toilet transfer with Supervision, min A for clothing management and setup A for pericare. Ambulation back into room to w/c with RW at Supervision level. Ascend/descend 4 x 6" stairs with 2 handrails and CGA. Pt exhibits improved endurance for stair navigation this date. Also practiced walking up to stairs with RW and transferring UE from RW to stair rails to simulate home environment. Standing BLE coordination task with RW and CGA for balance: alt L/R target taps, alt L/R 4" step-taps. Pt fatigues quickly with standing activities, also requires v/c to perform task in a slow and controlled manner. Pt then requesting to perform UE fine motor tasks. While seated at therapy table pt able to perform various [facility] tasks with use of level tan putty: finding beads buried in putty and retrieving via pinching grasp, rolling putty into a "snake" and then performing pinching with each digit to thumb. Ambulation x 50 ft with RW at Supervision level. Pt declines to ambulate any further. Assisted pt back to recliner at end of session. Pt left seated in recliner in room with needs in reach at end of session.  Therapy Documentation Precautions:  Precautions Precautions: Cervical, Fall Precaution Booklet Issued: Yes (comment) Precaution Comments: reviewd cervial precautions with pt and husband with handout provided Required Braces or Orthoses: Cervical Brace Cervical Brace:  Hard collar Restrictions Weight Bearing Restrictions: No   Therapy/Group: Individual Therapy   Excell Seltzer, PT, DPT  10/04/2019, 3:43 PM

## 2019-10-04 NOTE — Progress Notes (Signed)
Occupational Therapy Session Note  Patient Details  Name: ZIYAN BORGA MRN: [id number] Date of Birth: 06/03/1941  Today's Date: 10/04/2019 OT Individual Time: 1000-1100 OT Individual Time Calculation (min): 60 min    Short Term Goals: Week 2:  OT Short Term Goal 1 (Week 2): STG=LTG secondary to ELOS  Skilled Therapeutic Interventions/Progress Updates:  Pt received seated in recliner agreeable to OT intervention. Pt completed recliner>w/c transfer with RW and supervision. Pt transported to day room with total A from w/c. Pt completed seated [place] activity to facilitate increased [facility] for IADL participation. Pt instructed to write out checks to increase LUE [facility] as pt reports she often writes checks for bills at home and pt feels as though her handwriting is worsening. Pt completed task with supervision needing verbal cues on correct placement of pertinent detail on checks however pt handwriting legible and appropriate. Pt completed additional seated [place] activity using a puzzle to facilitate increased [facility] for ADL participation. Pt required step by step cues on how to sequence completing a puzzle such as putting together all edges first and locating corner pieces. Pt required increased time and effort to complete puzzle although pt appeared to enjoy task. Encouraged pt to walk back to room from dayroom with pt stating, " no ma'am". Offered encouragement and education on progressing functional mobility to facilitate increased endurance. Pt agreeable to walk to doorway of day room and be transported back to room the remainder of the distance, supervision with RW and close w/c follow. Pt left seated in recliner with bed alarm activated and all needs within reach.   Therapy Documentation Precautions:  Precautions Precautions: Cervical, Fall Precaution Booklet Issued: Yes (comment) Precaution Comments: reviewd cervial precautions with pt and husband with handout provided Required Braces or Orthoses:  Cervical Brace Cervical Brace: Hard collar Restrictions Weight Bearing Restrictions: No General:   Vital Signs:   Pain: Pt reports no pain during session.   Therapy/Group: Individual Therapy  [name] 10/04/2019, 11:51 AM

## 2019-10-04 NOTE — Plan of Care (Signed)
  Problem: Consults Goal: RH SPINAL CORD INJURY PATIENT EDUCATION Description:  See Patient Education module for education specifics.  Outcome: Progressing   Problem: SCI BOWEL ELIMINATION Goal: RH STG MANAGE BOWEL WITH ASSISTANCE Description: STG Manage Bowel with mod I Assistance. Outcome: Progressing   Problem: RH SAFETY Goal: RH STG ADHERE TO SAFETY PRECAUTIONS W/ASSISTANCE/DEVICE Description: STG Adhere to Safety Precautions With cues and reminders Outcome: Progressing

## 2019-10-05 ENCOUNTER — Inpatient Hospital Stay (HOSPITAL_COMMUNITY): Payer: Medicare HMO | Admitting: Physical Therapy

## 2019-10-05 ENCOUNTER — Inpatient Hospital Stay (HOSPITAL_COMMUNITY): Payer: Medicare HMO

## 2019-10-05 LAB — GLUCOSE, CAPILLARY
Glucose-Capillary: 109 mg/dL — ABNORMAL HIGH (ref 70–99)
Glucose-Capillary: 144 mg/dL — ABNORMAL HIGH (ref 70–99)
Glucose-Capillary: 139 mg/dL — ABNORMAL HIGH (ref 70–99)
Glucose-Capillary: 99 mg/dL (ref 70–99)

## 2019-10-05 MED ORDER — DULOXETINE HCL 20 MG PO CPEP
20.0000 mg | ORAL_CAPSULE | Freq: Every day | ORAL | Status: DC
  Filled 2019-10-05 (×2): qty 1

## 2019-10-05 NOTE — Progress Notes (Signed)
Occupational Therapy Session Note  Patient Details  Name: Doris Jackson MRN: [id number] Date of Birth: [birth year]  Today's Date: 10/05/2019 OT Individual Time: 1000-1100 OT Individual Time Calculation (min): 60 min    Short Term Goals: Week 2:  OT Short Term Goal 1 (Week 2): STG=LTG secondary to ELOS  Skilled Therapeutic Interventions/Progress Updates:    Pt resting in recliner upon arrival and agreeable to therapy.  OT intervention with ongoing focus on LUE [facility] and gross motor function to increase independence with BADLs and IADLs. Pt engaged in variety of table tasks including manipulating small pegs, pipe tree, velcro blocks. Pt noted with most difficulty manipulating small pegs using gross grasp because she is unable to use pincher grasp to pick up pegs. Pt also practiced printing her name with adapted pen.  Pt issued pen with red tubing. Pt commented that her LUE was tired from all the "work" she had done.  Pt returned to room and amb with RW to recliner.  Pt remained in recliner with all needs within reach and seat alarm activated.  Therapy Documentation Precautions:  Precautions Precautions: Cervical, Fall Precaution Booklet Issued: Yes (comment) Precaution Comments: reviewd cervial precautions with pt and husband with handout provided Required Braces or Orthoses: Cervical Brace Cervical Brace: Hard collar Restrictions Weight Bearing Restrictions: No   Pain:  Pt denies pain this morning   Therapy/Group: Individual Therapy  Leroy Libman 10/05/2019, 12:29 PM

## 2019-10-05 NOTE — Plan of Care (Signed)
  Problem: SCI BOWEL ELIMINATION Goal: RH STG MANAGE BOWEL WITH ASSISTANCE Description: STG Manage Bowel with mod I Assistance. Outcome: Progressing Goal: RH STG SCI MANAGE BOWEL PROGRAM W/ASSIST OR AS APPROPRIATE Description: STG SCI Manage bowel program with min assist or as appropriate. Outcome: Progressing   Problem: SCI BLADDER ELIMINATION Goal: RH STG MANAGE BLADDER WITH ASSISTANCE Description: STG Manage Bladder With mod I Assistance Outcome: Progressing

## 2019-10-05 NOTE — Progress Notes (Signed)
Physical Therapy Session Note  Patient Details  Name: GABBRIELLE SAAB MRN: [id number] Date of Birth: 1940/07/06  Today's Date: 10/05/2019 PT Missed Time: 75 min Missed Time Reason: patient fatigue; patient unwilling to participate     Short Term Goals: Week 2:  PT Short Term Goal 1 (Week 2): =LTG due to ELOS  Skilled Therapeutic Interventions/Progress Updates:    Attempted to see patient for scheduled PM session. Pt reports she just got back into bed and is feeling fatigued. Pt declines any participation in therapy session this PM due to fatigue. Encouraged participation as pt is leaving on Friday and would benefit from as much therapy as she can tolerate prior to d/c home. Pt again declines any participation this PM. Pt left seated in bed with needs in reach. Pt missed 75 min of scheduled skilled therapy services due to fatigue.  Therapy Documentation Precautions:  Precautions Precautions: Cervical, Fall Precaution Booklet Issued: Yes (comment) Precaution Comments: reviewd cervial precautions with pt and husband with handout provided Required Braces or Orthoses: Cervical Brace Cervical Brace: Hard collar Restrictions Weight Bearing Restrictions: No    Therapy/Group: Individual Therapy   Excell Seltzer, PT, DPT  10/05/2019, 4:11 PM

## 2019-10-05 NOTE — Progress Notes (Signed)
[facility] PHYSICAL MEDICINE & REHABILITATION PROGRESS NOTE   Subjective/Complaints:  Pt's LBM was Friday- still hasn't gone- had told me this AM she had, but nothing documented.   Pt also didn't like Lyrica- made her "feel funny" and :"confused", willing to try something else if doesn't have issues with it.   Discussed Duloxetine for nerve pain- explained takes 1+ weeks to kick in.     ROS:   Pt denies SOB, abd pain, CP, N/V/C/D, and vision changes   Objective:   No results found. Recent Labs    10/03/19 0519  WBC 5.4  HGB 8.6*  HCT 25.7*  PLT 153   Recent Labs    10/03/19 0519  NA 142  K 3.9  CL 108  CO2 25  GLUCOSE 131*  BUN 26*  CREATININE 1.25*  CALCIUM 8.6*    Intake/Output Summary (Last 24 hours) at 10/05/2019 1651 Last data filed at 10/05/2019 0830 Gross per 24 hour  Intake 340 ml  Output --  Net 340 ml     Physical Exam: Vital Signs Blood pressure 119/85, pulse 98, temperature (!) 97.4 F (36.3 C), resp. rate 19, height '5\' 2"'  (1.575 m), weight 97.7 kg, SpO2 100 %.  Constitutional: sitting up in manual w/c, OT at bedside, putting on TEDs with OT; NAD HEENT: wearing eyeglasses, conjugate gaze Neck: supple Cardiovascular:RRR_ no JVD Respiratory/Chest:CTA B/L- no accessory muscle use GI/Abdomen: still soft, hypoactive, NT, somewhat distended Ext: no clubbing, cyanosis, or edema Psych: mild anxiety Skin: Vascular changes bilateral lower extremities  also has a myriad of dark "age spots" all over back seen- nothing concerning; IV in L wrist Musc: most TTP over L rhomboids and just next to cervical spine posteriorly Neuro: Ox3 Motor: Grossly 4-/5 throughout. Stable  Assessment/Plan: 1. 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  Physiatrist is providing close team supervision and 24 hour management of active medical problems listed below.  Physiatrist and rehab team continue to assess barriers to discharge/monitor patient progress toward functional and medical goals  Care Tool:  Bathing  Bathing activity did not occur: Refused Body parts bathed by patient: Right arm, Left arm, Chest, Abdomen, Front perineal area, Buttocks, Right upper leg, Left upper leg, Face   Body parts bathed by helper: Right lower leg, Left lower leg, Right arm     Bathing assist Assist Level: Set up assist     Upper Body Dressing/Undressing Upper body dressing   What is the patient wearing?: Pull over shirt, Orthosis    Upper body assist Assist Level: Minimal Assistance - Patient > 75%    Lower Body Dressing/Undressing Lower body dressing      What is the patient wearing?: Incontinence brief, Pants     Lower body assist Assist for lower body dressing: Minimal Assistance - Patient > 75%     Toileting Toileting    Toileting assist Assist for toileting: Supervision/Verbal cueing     Transfers Chair/bed transfer  Transfers assist     Chair/bed transfer assist level: Supervision/Verbal cueing     Locomotion Ambulation   Ambulation assist      Assist level: Supervision/Verbal cueing Assistive device: Walker-rolling Max distance: 50'   Walk 10 feet activity   Assist  Walk 10 feet activity did not occur: Safety/medical concerns(unable without AD; min assist with RW)  Assist level: Supervision/Verbal cueing Assistive device: Walker-rolling   Walk 50 feet activity   Assist Walk 50 feet with 2 turns activity did not occur: Safety/medical concerns  Assist level: Supervision/Verbal cueing Assistive device: Walker-rolling    Walk 150 feet activity   Assist Walk 150 feet activity did not occur: Safety/medical concerns         Walk 10 feet on uneven surface  activity   Assist  Walk 10 feet on uneven surfaces activity did not occur: Safety/medical concerns         Wheelchair     Assist Will patient use wheelchair at discharge?: No Type of Wheelchair: Manual    Wheelchair assist level: Supervision/Verbal cueing Max wheelchair distance: 25    Wheelchair 50 feet with 2 turns activity    Assist    Wheelchair 50 feet with 2 turns activity did not occur: Safety/medical concerns       Wheelchair 150 feet activity     Assist  Wheelchair 150 feet activity did not occur: Safety/medical concerns       Blood pressure 119/85, pulse 98, temperature (!) 97.4 F (36.3 C), resp. rate 19, height '5\' 2"'  (1.575 m), weight 97.7 kg, SpO2 100 %.  Medical Problem List and Plan: 1.  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  Antithrombotics: -DVT/anticoagulation:  Mechanical: Sequential compression devices, below knee Bilateral lower extremities             -antiplatelet therapy: N/A 3. Pain Management: Continues to be a limiting factor.  4/1- added Lyrica 50 mg BID for nerve pain- cont' tylenol  4/5- pain controlled with lyrica and tylenol  4/6- has been refusing lyrica- will d/c; con't tylenol  4/7- add Cymbalta/Duloxetine 20 mg QHS x 4 days then 40 mg QHS x 1 week then 60 mg QHS- for nerve pain.  4. Mood: LCSW to follow for evaluation and support.              -antipsychotic agents: N/a 5. Neuropsych: This patient is capable of making decisions on her own behalf.  Will consider Neuropsych consult due to anxiety 6. Skin/Wound Care: Routine pressure relief measures.  7. Fluids/Electrolytes/Nutrition: Monitor I/O.  8. GERD/Dysphagia:   Advanced to D2 thins, continue advance as tolerated  3/29-  encourage fluids since Cr up to 1.5 and BUN 34  3/30- stopped HCTZ as of this AM due to Cr/BUN increase- will recheck labs in AM-   4/2- pt happy with D3 thins- doesn't want to change up anymore- likes chopped meats 9.T2DM: Hgb A1c- 7.0.Will monitor BS ac/hs. Continue Glucotrol and actos.  Changed Glucerna to Ensure max and addED CM restrictions to diet. D/ced lantus--not home med and resume [place].   Added meal coverage tid.    CBG (last 3)  Recent Labs    10/05/19 0627 10/05/19 1153 10/05/19 1622  GLUCAP 109* 144* 139*   4/7- great controlled- con't meds 10. HTN: Monitor BP tid. Continue Lotension/Norvasc and metoprolol.   4/3 BP controlled off Benazepril and increased Norvasc; off HCTZ  4/7- BP still controlled on current regimen-  11.  ABLA: Continue iron supplement.  Hemoglobin 9.6 on 3/26  Continue to monitor 12. Thrombocytopenia: Resolved  Monitor for signs of bleeding.  13. Leucocytosis: Resolved  Monitor for  signs of infection.  14. Pre-renal azotemia in setting of AKI on CKD Stage III: Encourage fluid intake.   Creatinine 1.18 on 3/26  3/30- will recheck in AM off HCTZ  4/1- will stop Benazepril and increase Norvasc to 10 mg daily- will also give IVFs x 24 hours  4/2- Cr down to 1.67 from 2.05 and NUN 46 form 49- will recheck Monday  4/5- Pt's BUN down to 26 and Cr 1.25- con't off BP meds and encourage fluids.  15. Morbid Obesity- BMI >40- discuss weight loss in the long term- 16/ Constipation  4/5- LBM 4/2 in AM x2 Friday- lqiuidy- will keep off imodum- stopped and see if pt can have BM.    4/6- no BM since Friday AM- is Tuesday- will strongly encourage pt to take bowel meds- is scared of loose stools.  4/7- If no BM today, will need mg citrate or sorbitol tomorrow- pt scared of loose stools- I'm concerned about ileus.        LOS: 13 days A FACE TO FACE EVALUATION WAS PERFORMED  Tamara Monteith 10/05/2019, 4:51 PM

## 2019-10-05 NOTE — Progress Notes (Signed)
Occupational Therapy Session Note  Patient Details  Name: Doris Jackson MRN: [id number] Date of Birth: 1940-09-08  Today's Date: 10/05/2019 OT Individual Time: 0800-0900 OT Individual Time Calculation (min): 60 min    Short Term Goals: Week 2:  OT Short Term Goal 1 (Week 2): STG=LTG secondary to ELOS  Skilled Therapeutic Interventions/Progress Updates:  Pt recived supine in bed agreeable to OT intervention reporting having a better night. Pt transitioned from supine>sitting with supervision with use of bed rails and HOB 21 degrees. Pt completed functional mobility from EOB>toilet with RW and supervision. Pt requested to complete bathing on toilet. Set- up assist for UB/LB bathing. MIN A for UB dressing needing assist to get head of shirt over collar and MIN A for LB to don pants needing assist to initially begin threading LLE. Pt uses sock aid to don socks from EOB with supervision. Trialed using sock aid to don TEDs with little success. Pt able to teach back donning TED hose using friction eliminating device as precursor to family education. Pt completed seated grooming tasks with supervision from w/c level at sink. Pt transported to ADL apartment with total A from w/c. Pt completed stand pivot transfer to TTB with supervision and verbal cues for body mechanics. Education provided on safety concerns with showering with pt verbalizing understanding. Pt transported back to room in similar fashion as previously indicated. Pt left seated in recliner with all needs within reach and chair alarm activated.   Therapy Documentation Precautions:  Precautions Precautions: Cervical, Fall Precaution Booklet Issued: Yes (comment) Precaution Comments: reviewd cervial precautions with pt and husband with handout provided Required Braces or Orthoses: Cervical Brace Cervical Brace: Hard collar Restrictions Weight Bearing Restrictions: No General:   Vital Signs:   Pain: Pt reports no pain during session.    Therapy/Group: Individual Therapy  [name] 10/05/2019, 10:19 AM

## 2019-10-06 ENCOUNTER — Inpatient Hospital Stay (HOSPITAL_COMMUNITY): Payer: Medicare HMO | Admitting: Physical Therapy

## 2019-10-06 ENCOUNTER — Inpatient Hospital Stay (HOSPITAL_COMMUNITY): Payer: Medicare HMO

## 2019-10-06 LAB — CBC
HCT: 29.3 % — ABNORMAL LOW (ref 36.0–46.0)
Hemoglobin: 9.5 g/dL — ABNORMAL LOW (ref 12.0–15.0)
MCH: 26.7 pg (ref 26.0–34.0)
MCHC: 32.4 g/dL (ref 30.0–36.0)
MCV: 82.3 fL (ref 80.0–100.0)
Platelets: 158 10*3/uL (ref 150–400)
RBC: 3.56 MIL/uL — ABNORMAL LOW (ref 3.87–5.11)
RDW: 17.6 % — ABNORMAL HIGH (ref 11.5–15.5)
WBC: 5.4 10*3/uL (ref 4.0–10.5)
nRBC: 0 % (ref 0.0–0.2)

## 2019-10-06 LAB — GLUCOSE, CAPILLARY
Glucose-Capillary: 105 mg/dL — ABNORMAL HIGH (ref 70–99)
Glucose-Capillary: 177 mg/dL — ABNORMAL HIGH (ref 70–99)
Glucose-Capillary: 131 mg/dL — ABNORMAL HIGH (ref 70–99)
Glucose-Capillary: 153 mg/dL — ABNORMAL HIGH (ref 70–99)

## 2019-10-06 MED ORDER — POLYSACCHARIDE IRON COMPLEX 150 MG PO CAPS: 150.0000 mg | ORAL_CAPSULE | Freq: Every day | ORAL | 1 refills | Status: DC

## 2019-10-06 MED ORDER — SENNOSIDES-DOCUSATE SODIUM 8.6-50 MG PO TABS: 1.0000 | ORAL_TABLET | Freq: Every day | ORAL | 0 refills | Status: DC

## 2019-10-06 MED ORDER — SITAGLIPTIN PHOSPHATE 100 MG PO TABS: 100.0000 mg | ORAL_TABLET | Freq: Every day | ORAL | 0 refills | Status: DC

## 2019-10-06 MED ORDER — DULOXETINE HCL 20 MG PO CPEP: ORAL_CAPSULE | ORAL | 0 refills | Status: DC

## 2019-10-06 MED ORDER — ADULT MULTIVITAMIN W/MINERALS CH: 1.0000 | ORAL_TABLET | Freq: Every day | ORAL | Status: DC

## 2019-10-06 MED ORDER — AMLODIPINE BESYLATE 10 MG PO TABS: 10.0000 mg | ORAL_TABLET | Freq: Every day | ORAL | 0 refills | Status: DC

## 2019-10-06 NOTE — Progress Notes (Signed)
Physical Therapy Session Note  Patient Details  Name: MARRION BOYD MRN: [id number] Date of Birth: [birth year]  Today's Date: 10/06/2019 PT Individual Time: 1345-1430 PT Individual Time Calculation (min): 45 min   Short Term Goals: Week 2:  PT Short Term Goal 1 (Week 2): =LTG due to ELOS  Skilled Therapeutic Interventions/Progress Updates:    Pt received seated in recliner, agreeable to therapy session. Pt reports some soreness in B UE and LE from working hard in therapy sessions, no complaints of actual pain. Sit to stand at mod I level to RW throughout session. Ambulation x 100 ft with RW at mod I level. Car transfer with Supervision with cues for safe transfer technique. Ascend/descend ramp with RW at Supervision level. Encouraged pt to perform gait across uneven surface to simulate community environments she may encounter, pt declines and has increase in anxiety with idea of trying something new. Assisted pt back to recliner at end of session. Pt left seated in recliner with needs in reach at end of session.  Therapy Documentation Precautions:  Precautions Precautions: Cervical, Fall Precaution Booklet Issued: Yes (comment) Precaution Comments: pt able to state 3/3 precautions Required Braces or Orthoses: Cervical Brace Cervical Brace: Hard collar Restrictions Weight Bearing Restrictions: No    Therapy/Group: Individual Therapy   Excell Seltzer, PT, DPT  10/06/2019, 2:40 PM

## 2019-10-06 NOTE — Progress Notes (Signed)
Nutrition Follow-up  DOCUMENTATION CODES:   Obesity unspecified  INTERVENTION:  Continue MVI daily  Continue Magic cup TID with meals, each supplement provides 290 kcal and 9 grams of protein  Continue Evening snack daily   NUTRITION DIAGNOSIS:   Increased nutrient needs related to other (see comment)(therapies) as evidenced by estimated needs.  Ongoing.  GOAL:   Patient will meet greater than or equal to 90% of their needs  Progressing.  MONITOR:   PO intake, Supplement acceptance, Skin, Weight trends, Labs, Diet advancement, I & O's  REASON FOR ASSESSMENT:   Malnutrition Screening Tool    ASSESSMENT:   Pt with a PMH significant for DM, HTN, HLD who presented to the ED with worsening numbness and tingling in her extremities and was found to have spinal cord compression due to severe spinal stenosis. Pt underwent surgical decompression on 3/18. Pt admitted to [facility] 3/25.  4/1 - diet upgraded to DYS3/thins  Noted anticipated discharge tomorrow.  Pt reports appetite is poor, but that she enjoyed the nighttime snack she received. Pt also reports drinking Ensures on occasion.   PO Intake: 50-100% x last 8 recorded meals (89% average intake)  Medications reviewed and include: Vitamin D3, Glucotrol, Novolog, Tradjenta, MVI, Prednisolone, Senokot-S  Labs reviewed: CBGs 105-177  Diet Order:   Diet Order            DIET DYS 3 Room service appropriate? Yes; Fluid consistency: Thin  Diet effective now              EDUCATION NEEDS:   No education needs have been identified at this time  Skin:  Skin Assessment: Skin Integrity Issues: Skin Integrity Issues:: Incisions Incisions: neck  Last BM:  4/7  Height:   Ht Readings from Last 1 Encounters:  09/23/19 5\' 2"  (1.575 m)    Weight:   Wt Readings from Last 1 Encounters:  10/06/19 98.3 kg    BMI:  Body mass index is 39.64 kg/m.  Estimated Nutritional Needs:   Kcal:  1750-2000  Protein:  95-105  grams  Fluid:  >/= 1.8 L/d    Larkin Ina, MS, RD, LDN RD pager number and weekend/on-call pager number located in [place].

## 2019-10-06 NOTE — Progress Notes (Signed)
Occupational Therapy Session Note  Patient Details  Name: Doris Jackson MRN: [id number] Date of Birth: [birth year]  Today's Date: 10/06/2019 OT Individual Time: [id number] Individual Time Calculation (min): 75 min    Short Term Goals: Week 2:  OT Short Term Goal 1 (Week 2): STG=LTG secondary to ELOS  Skilled Therapeutic Interventions/Progress Updates:  Pt received supine in bed agreeable to OT intervention. Session focus on BADL participation, functional mobility and IADL tasks. Pt requires supervision for bed mobility with pt able to direct care of how to don c collar from EOB. Pt completed household distance functional mobility with RW with supervision from EOB>bathroom. Pt completed UB/LB dressing/ bathing with set- up assist from toilet. Encouraged pt to attempt standing ADL at sink with pt declining participation as pt reports likely will sit on toilet for ADLs. Pt required total A to don TED hose but able to direct caregiver about how to don, pt uses sock aid to don socks from w/c. Pt transported to ADL apartment in w/c with total A. Instructed pt on standing simple meal prep activity of making peanut butter crackers, however pt declined completing task in standing. Pt completed 1 sit<>stand from w/c with RW with supervision to retrieve supplies and completed meal prep seated in w/c. Education provided on placing needing kitchen items at shoulder height in order to maintain cervical precautions with pt verbalizing understanding. Pt declined practicing tub transfer however pt reports feeling comfortable with transfer at home. Pt completed functional mobility to recliner with pt able to sit<>stand from low/ compliant surface with RW and supervision. Pt transported back to room in w/c in similar fashion as previously indicated. Pt left seated in recliner with chair alarm activated and all needs within reach.   Therapy Documentation Precautions:  Precautions Precautions: Cervical,  Fall Precaution Booklet Issued: Yes (comment) Precaution Comments: pt able to state 3/3 precautions Required Braces or Orthoses: Cervical Brace Cervical Brace: Hard collar Restrictions Weight Bearing Restrictions: No General:   Vital Signs:   Pain: Pt reports mild pain this AM in L shoulder with RN present giving pain meds.   Therapy/Group: Individual Therapy  [name] 10/06/2019, 11:07 AM

## 2019-10-06 NOTE — Progress Notes (Signed)
Occupational Therapy Session Note  Patient Details  Name: Doris Jackson MRN: [id number] Date of Birth: May 20, 1941  Today's Date: 10/06/2019 OT Individual Time: 1000-1055 OT Individual Time Calculation (min): 55 min    Short Term Goals: Week 2:  OT Short Term Goal 1 (Week 2): STG=LTG secondary to ELOS  Skilled Therapeutic Interventions/Progress Updates:    Pt resting in recliner upon arrival.  OT intervention with focus on LUE [place] tasks to increase independence with BADLs and IADLs. Pt challenged with picking up poker chips from table and stacking with LUE/hand. Pt had to slide poker chips to edge of table before picking them up.  Pt able to hold them in her hand appropriately to stack them and remove them.  Pt performed task with 5 chips X 3. Pt also issued soft (yellow) theraputty with 10 beads.  Pt removed and replaced beads X 3. Pt also practiced printing her name using adapted writing instrument. Pt pleased with progress and ready for discharge home tomorrow.   Therapy Documentation Precautions:  Precautions Precautions: Cervical, Fall Precaution Booklet Issued: Yes (comment) Precaution Comments: pt able to state 3/3 precautions Required Braces or Orthoses: Cervical Brace Cervical Brace: Hard collar Restrictions Weight Bearing Restrictions: No   Pain: Pain Assessment Pain Scale: 0-10 Pain Score: 2  Pain Type: Acute pain Pain Location: Neck Pain Orientation: Left;Mid Pain Descriptors / Indicators: Sore Pain Onset: Gradual Patients Stated Pain Goal: 1 Pain Intervention(s): Heat applied Multiple Pain Sites: No   Therapy/Group: Individual Therapy  Leroy Libman 10/06/2019, 12:04 PM

## 2019-10-06 NOTE — Progress Notes (Signed)
Occupational Therapy Discharge Summary  Patient Details  Name: Doris Jackson MRN: [id number] Date of Birth: 03/20/1941       Patient has met 37 of 22 long term goals due to improved activity tolerance, improved balance, postural control, functional use of  LEFT upper extremity and improved coordination. Overall, pt requires supervision for UB/ LB ADLs in sitting, supervision for household distance functional mobility with RW, and supervision for toilet/ shower transfers. Pt with improved LUE FMC noted by pt able to complete simple meal prep task in sitting. Pt completes seated grooming tasks with supervision. Pt continues to fatigue quickly with activity requiring encouragement to increase activity tolerance and endurance for ADL participation. Family been supportive and encouraging during IR LOS. Patient to discharge at overall Supervision level.  Patient's care partner is independent to provide the necessary physical assistance at discharge.    Reasons goals not met: n/a  Recommendation:  Patient will benefit from ongoing skilled OT services in home health setting to continue to advance functional skills in the area of BADL and Reduce care partner burden.  Equipment: TTB  Reasons for discharge: treatment goals met and discharge from hospital  Patient/family agrees with progress made and goals achieved: Yes  OT Discharge Precautions/Restrictions  Precautions Precautions: Cervical;Fall Precaution Comments: pt able to state 3/3 precautions Required Braces or Orthoses: Cervical Brace Cervical Brace: Hard collar Restrictions Weight Bearing Restrictions: No General   Vital Signs Therapy Vitals Temp: 98.1 F (36.7 C) Pulse Rate: 91 Resp: 17 BP: 113/60 Patient Position (if appropriate): Lying Oxygen Therapy SpO2: 97 % Pain Pain Assessment Pain Scale: 0-10 Pain Score: 2  Pain Type: Acute pain Pain Location: Neck Pain Orientation: Left;Mid Pain Descriptors / Indicators:  Sore Pain Frequency: Intermittent Pain Onset: Gradual Patients Stated Pain Goal: 1 Pain Intervention(s): Medication (See eMAR);Heat applied Multiple Pain Sites: No ADL ADL Equipment Provided: Sock aid Eating: Independent Where Assessed-Eating: Bed level Grooming: Setup Where Assessed-Grooming: Sitting at sink Upper Body Bathing: Setup Where Assessed-Upper Body Bathing: Other (Comment)(sitting on toilet) Lower Body Bathing: Setup Where Assessed-Lower Body Bathing: Other (Comment)(sitting on toilet) Upper Body Dressing: Setup Where Assessed-Upper Body Dressing: Other (Comment)(sitting on toilet) Lower Body Dressing: Setup Where Assessed-Lower Body Dressing: Other (Comment)(sitting on toilet) Toileting: Supervision/safety Where Assessed-Toileting: Glass blower/designer: Close supervision Toilet Transfer Method: Counselling psychologist: Ambulance person Transfer: Close supervison Clinical cytogeneticist Method: Optometrist: Gaffer Baseline Vision/History: (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 pt tp f/u with eye MD for end of april) Patient Visual Report: No change from baseline Perception  Perception: Within Functional Limits Praxis Praxis: Intact Cognition Overall Cognitive Status: Within Functional Limits for tasks assessed Arousal/Alertness: Awake/alert Orientation Level: Oriented X4 Sensation Sensation Light Touch: Impaired Detail Peripheral sensation comments: reports "heavy" sensation on L hand Light Touch Impaired Details: Impaired LUE Hot/Cold: Appears Intact Proprioception: Appears Intact Stereognosis: Appears Intact Additional Comments: continues to report numbess and tingling in L hand Coordination Gross Motor Movements are Fluid and Coordinated: No Fine Motor Movements are Fluid and Coordinated: No Motor  Motor Motor: Tetraplegia;Abnormal postural alignment and  control Motor - Skilled Clinical Observations: greater weakness in L vs R Mobility  Bed Mobility Bed Mobility: Rolling Left;Supine to Sit;Sit to Supine Rolling Left: Supervision/Verbal cueing Supine to Sit: Supervision/Verbal cueing Sit to Supine: Supervision/Verbal cueing Transfers Sit to Stand: Supervision/Verbal cueing Stand to Sit: Supervision/Verbal cueing  Trunk/Postural Assessment  Cervical Assessment Cervical Assessment: (C collar) Thoracic Assessment Thoracic  Assessment: Exceptions to WFL(rounder shoulders) Lumbar Assessment Lumbar Assessment: Within Functional Limits Postural Control Postural Control: Within Functional Limits  Balance Balance Balance Assessed: Yes Static Sitting Balance Static Sitting - Level of Assistance: 5: Stand by assistance Dynamic Sitting Balance Dynamic Sitting - Level of Assistance: 5: Stand by assistance Static Standing Balance Static Standing - Level of Assistance: 5: Stand by assistance Dynamic Standing Balance Dynamic Standing - Level of Assistance: 5: Stand by assistance Extremity/Trunk Assessment RUE Assessment RUE Assessment: Within Functional Limits Active Range of Motion (AROM) Comments: 3+/ 5; AROM 0-80 ( d/t cervical precautions) LUE Assessment LUE Assessment: Exceptions to [facility] Active Range of Motion (AROM) Comments: AROM 0-80 3/5   [name] 10/06/2019, 8:43 AM

## 2019-10-06 NOTE — Progress Notes (Signed)
Physical Therapy Discharge Summary  Patient Details  Name: Doris Jackson MRN: [id number] Date of Birth: 1941/02/18  Today's Date: 10/06/2019  Patient has met 7 of 8 long term goals due to improved activity tolerance, improved balance, improved postural control, increased strength, increased range of motion, decreased pain and ability to compensate for deficits.  Patient to discharge at an ambulatory level Supervision to [place].   Patient's care partner is independent to provide the necessary physical assistance at discharge. Pt's husband has been educated on pt's current assist level and is able to provide that level of assist upon d/c home.  Reasons goals not met: Patient did not meet bed mobility goal as she requires Supervision cueing for safe transfer technique in/out of bed. Pt has met goal adequately for a safe d/c home as her family is able to provide this level of assist.  Recommendation:  Patient will benefit from ongoing skilled PT services in home health setting to continue to advance safe functional mobility, address ongoing impairments in endurance, strength, FMC, safety, balance, independence with functional mobility, and minimize fall risk.  Equipment: RW  Reasons for discharge: treatment goals met and discharge from hospital  Patient/family agrees with progress made and goals achieved: Yes  PT Discharge Precautions/Restrictions Precautions Precautions: Cervical;Fall Precaution Comments: pt able to state 3/3 precautions Required Braces or Orthoses: Cervical Brace Cervical Brace: Hard collar Restrictions Weight Bearing Restrictions: No Vision/Perception  Perception Perception: Within Functional Limits Praxis Praxis: Intact  Cognition Overall Cognitive Status: Within Functional Limits for tasks assessed Arousal/Alertness: Awake/alert Orientation Level: Oriented X4 Attention: Sustained Sustained Attention: Appears intact Memory: Appears  intact Awareness: Appears intact Problem Solving: Appears intact Safety/Judgment: Appears intact Sensation Sensation Light Touch: Impaired Detail Peripheral sensation comments: reports "heavy" sensation on L hand Light Touch Impaired Details: Impaired RUE;Impaired LUE;Impaired RLE;Impaired LLE Proprioception: Appears Intact Coordination Gross Motor Movements are Fluid and Coordinated: No Fine Motor Movements are Fluid and Coordinated: No Coordination and Movement Description: impaired 2/2 cervical myelopathy Motor  Motor Motor: Tetraplegia;Abnormal postural alignment and control Motor - Skilled Clinical Observations: greater weakness in L vs R Motor - Discharge Observations: generalized weakness  Mobility Bed Mobility Bed Mobility: Rolling Right;Rolling Left;Supine to Sit;Sit to Supine Rolling Right: Supervision/verbal cueing Rolling Left: Supervision/Verbal cueing Supine to Sit: Supervision/Verbal cueing Sit to Supine: Supervision/Verbal cueing Transfers Transfers: Sit to Stand;Stand to Sit;Stand Pivot Transfers Sit to Stand: Independent with assistive device Stand to Sit: Independent with assistive device Stand Pivot Transfers: Independent with assistive device Transfer (Assistive device): Rolling walker Locomotion  Gait Ambulation: Yes Gait Assistance: Independent with assistive device Gait Distance (Feet): 100 Feet Assistive device: Rolling walker Gait Gait: Yes Gait Pattern: Impaired Gait Pattern: Decreased step length - right;Decreased step length - left;Decreased hip/knee flexion - right;Decreased hip/knee flexion - left(path deviation) Gait velocity: increased Stairs / Additional Locomotion Stairs: Yes Stairs Assistance: Supervision/Verbal cueing Stair Management Technique: Two rails;Step to pattern Number of Stairs: 4 Height of Stairs: 6 Ramp: Supervision/Verbal cueing Wheelchair Mobility Wheelchair Mobility: No  Trunk/Postural Assessment  Cervical  Assessment Cervical Assessment: Exceptions to WFL(hard collar; cervical precautions) Thoracic Assessment Thoracic Assessment: Exceptions to WFL(rounded shoulders) Lumbar Assessment Lumbar Assessment: Exceptions to WFL(posterior pelvic tilt) Postural Control Postural Control: Within Functional Limits  Balance Balance Balance Assessed: Yes Static Sitting Balance Static Sitting - Level of Assistance: 6: Modified independent (Device/Increase time) Dynamic Sitting Balance Dynamic Sitting - Level of Assistance: 6: Modified independent (Device/Increase time) Static Standing Balance Static Standing - Level of Assistance: 6: Modified  independent (Device/Increase time) Dynamic Standing Balance Dynamic Standing - Level of Assistance: 6: Modified independent (Device/Increase time) Extremity Assessment   RLE Assessment RLE Assessment: Exceptions to [facility] General Strength Comments: grossly 4/5 LLE Assessment LLE Assessment: Exceptions to [facility] General Strength Comments: grossly 4-/5 except 3+/5 hip flexion     Excell Seltzer, PT, DPT Page Spiro, PT, DPT 10/06/2019, 2:40 PM

## 2019-10-06 NOTE — Progress Notes (Signed)
Physical Therapy Session Note  Patient Details  Name: Doris Jackson MRN: [id number] Date of Birth: Aug 27, 1940  Today's Date: 10/06/2019 PT Individual Time: 1703-1730 PT Individual Time Calculation (min): 27 min   Short Term Goals: Week 2:  PT Short Term Goal 1 (Week 2): =LTG due to ELOS  Skilled Therapeutic Interventions/Progress Updates:   Pt received sitting in recliner and agreeable to therapy session. Wearing hard cervical collar for entire session. Sit<>stands using RW mod-I throughout. Ambulatory (~20ft) stand pivot transfer to w/c using RW mod-I.  Transported to/from gym in w/c for time management and energy conservation. Ascended/descended 4 steps using B HRs with close supervision -  ascending with step-to pattern leading with R LE and descending partially turned sideways using step-to pattern leading with L LE. Pt reports that her husband will be able to bring RW up/down the stairs for her. Transported back to room in w/c and pt requesting to use bathroom. Ambulated ~10-17ft x2 in/out of bathroom using RW mod-I. Sit<>stand RW<>toilet mod-I. While on toilet pt completed UB and LB clothing management to change into gown with assist only for changing brief and tying gown behind neck. Standing hand hygiene at sink mod-I demonstrating safe AD management at counter. Pt left seated in recliner with needs in reach and B LEs elevated.   Therapy Documentation Precautions:  Precautions Precautions: Cervical, Fall Precaution Booklet Issued: Yes (comment) Precaution Comments: pt able to state 3/3 precautions Required Braces or Orthoses: Cervical Brace Cervical Brace: Hard collar Restrictions Weight Bearing Restrictions: No  Pain:   No reports of pain throughout session.   Therapy/Group: Individual Therapy  Tawana Scale, PT, DPT 10/06/2019, 3:51 PM

## 2019-10-07 ENCOUNTER — Other Ambulatory Visit: Payer: Self-pay | Admitting: Internal Medicine

## 2019-10-07 DIAGNOSIS — J3089 Other allergic rhinitis: Secondary | ICD-10-CM | POA: Diagnosis not present

## 2019-10-07 DIAGNOSIS — J301 Allergic rhinitis due to pollen: Secondary | ICD-10-CM | POA: Diagnosis not present

## 2019-10-07 LAB — GLUCOSE, CAPILLARY: Glucose-Capillary: 128 mg/dL — ABNORMAL HIGH (ref 70–99)

## 2019-10-07 NOTE — Discharge Summary (Signed)
Physician Discharge Summary  Patient ID: NAPORSHA PULSIPHER MRN: [id number] DOB/AGE: 1941/05/26 79 y.o.  Admit date: 09/22/2019 Discharge date: 10/07/2019  Discharge Diagnoses:  Principal Problem:   Cervical myelopathy ([place]) Active Problems:   DM (diabetes mellitus) type II controlled with renal manifestation (HCC)   Obesity, morbid, BMI 40.0-49.9 (HCC)   S/P cervical spinal fusion   Acute blood loss anemia   Postoperative pain   Anxiety state   Discharged Condition: stable   Significant Diagnostic Studies: N/A   Labs:  Basic Metabolic Panel: BMP Latest Ref Rng & Units 10/03/2019 09/30/2019 09/28/2019  Glucose 70 - 99 mg/dL 131(H) 109(H) 125(H)  BUN 8 - 23 mg/dL 26(H) 46(H) 49(H)  Creatinine 0.44 - 1.00 mg/dL 1.25(H) 1.67(H) 2.05(H)  Sodium 135 - 145 mmol/L 142 142 136  Potassium 3.5 - 5.1 mmol/L 3.9 4.2 4.0  Chloride 98 - 111 mmol/L 108 106 99  CO2 22 - 32 mmol/L 25 26 27   Calcium 8.9 - 10.3 mg/dL 8.6(L) 8.6(L) 8.7(L)    CBC: CBC Latest Ref Rng & Units 10/06/2019 10/03/2019 09/28/2019  WBC 4.0 - 10.5 K/uL 5.4 5.4 7.2  Hemoglobin 12.0 - 15.0 g/dL 9.5(L) 8.6(L) 9.1(L)  Hematocrit 36.0 - 46.0 % 29.3(L) 25.7(L) 27.6(L)  Platelets 150 - 400 K/uL 158 153 180    CBG: Recent Labs  Lab 10/06/19 0618 10/06/19 1130 10/06/19 1638 10/06/19 2105 10/07/19 0618  GLUCAP 105* 177* 131* 153* 128*    Brief HPI:   MERCI SKILLING is a 79 y.o. female with history of HTN, T2DM, microcytic anemia , recent evaluation in ED revealing cord compression with severe spinal stenosis C3-C6 but refused admission therefore was discharged home on steroids per input from neurosurgery.  She was readmitted via ED on 09/14/2019 with worsening of numbness and tingling in bilateral hands and feet as well as balance deficits.  She was taken to the OR on 03/18 for ACDF C4/C5 with C5 cervical corpectomy and arthrodesis C4-C6.  Hospital course was significant for AKI due to prerenal azotemia treated with IV fluids for  hydration.  She has also had issues with throat discomfort and problem swallowing with speech therapy evaluation recommending regular diet.  However patient preferred a pureed diet and will advance per her comfort level.  Lantus added due to poorly controlled blood sugars and ABLA with thrombocytopenia has been monitored.  Patient continues to have impairments in mobility and ADLs.  CIR was recommended due to functional decline   Hospital Course: SYVANNAH PRIVE was admitted to rehab 09/22/2019 for inpatient therapies to consist of PT and OT at least three hours five days a week. Past admission physiatrist, therapy team and rehab RN have worked together to provide customized collaborative inpatient rehab. Diet has been slowly advanced per patient preference and po intake is good. Labs at admission revealed AKI. HCTZ and Benazepril were discontinued and renal status is gradually improving with increase in fluid intake. Blood pressures were monitored on TID basis and Norvasc was titrated to 10 mg for better control.  Her diabetes has been monitored with ac/hs CBG checks and SSI was use prn for tighter BS control. Lantus was discontinued and DPP4 was resumed. Her BS are improving and she is to follow up with PCP for further adjustment. She is continent of bowel and bladder.   Serial CBC showed that ABLA is stable and thrombocytopenia has resolved. Cervical incision is C/D/I with skin glue in place and she is to continue her collar for support. Her  pain control has improve and she is using tylenol on prn basis but  continues report neuropathy. She has declined Lyrica and was hesitant to use Cymbalta due to concerns of SE. She has been educated on rationale for use as well as its effect on mood and is willing to trial this at home. She is to start 20 mg/HS with weekly increase by 20 mg to 60 mg/HS.  She has made gains during rehab stay and is currently supervision to modified independent level.  She will continue to  receive follow upHHPT, HHOT and [place] aide by [facility] after discharge   Rehab course: During patient's stay in rehab weekly team conferences were held to monitor patient's progress, set goals and discuss barriers to discharge. At admission, patient required min to mod assist for mobility and mod assist with ADL tasks. She  has had improvement in activity tolerance, balance, postural control as well as ability to compensate for deficits. She has had improvement in functional use LUE and is able to complete ADL tasks with supervision. She requires supervision for bed mobility and to climb 6 stairs with two rails and cues for safety. She is modified independent for transfers and to ambulate 100' with RW. Family education was completed with husband.    Discharge disposition: 01-Home or Self Care  Diet: Diabetic diet  Special Instructions: 1. Wear collar at all times. No driving or strenuous activity till cleared by MD 2. Continue to monitor BS bi-qid basis.   Allergies as of 10/07/2019      Reactions   Dust Mite Extract Itching, Cough      Medication List    STOP taking these medications   amLODipine-benazepril 5-40 MG capsule Commonly known as: LOTREL   cyclobenzaprine 5 MG tablet Commonly known as: FLEXERIL   hydrochlorothiazide 25 MG tablet Commonly known as: HYDRODIURIL   HYDROcodone-acetaminophen 5-325 MG tablet Commonly known as: NORCO/VICODIN   insulin glargine 100 UNIT/ML injection Commonly known as: LANTUS   LORazepam 0.5 MG tablet Commonly known as: ATIVAN   zolpidem 5 MG tablet Commonly known as: AMBIEN     TAKE these medications   acetaminophen 500 MG tablet Commonly known as: TYLENOL Take 500 mg by mouth every 6 (six) hours as needed for mild pain.   amLODipine 10 MG tablet Commonly known as: NORVASC Take 1 tablet (10 mg total) by mouth daily.   Azelastine-Fluticasone 137-50 MCG/ACT Susp Place 1 spray into both nostrils daily. What changed:    when to take this  reasons to take this   cyanocobalamin 2000 MCG tablet Take 2,000 mcg by mouth daily.   DULoxetine 20 MG capsule Commonly known as: CYMBALTA One pill daily for a week. Then increase to two pills per day for a week. Then increase to three pills daily--take at bedtime.   fexofenadine 180 MG tablet Commonly known as: ALLEGRA Take 90 mg by mouth daily as needed for allergies.   glipiZIDE 5 MG 24 hr tablet Commonly known as: GLUCOTROL XL TAKE 1 TABLET BY MOUTH EVERY DAY   glucose blood test strip Commonly known as: OneTouch Verio TEST TWICE A DAY.  Dx E11.9   iron polysaccharides 150 MG capsule Commonly known as: Ferrex 150 Take 1 capsule (150 mg total) by mouth daily.   Lancets 30G Misc USE TWICE DAILY AS DIRECTED FOR GLUCOSE TESTING AND MONITORING   ONE TOUCH LANCETS Misc Test twice daily.   Magnesium 250 MG Tabs Take 1 tablet by mouth as needed.  metoprolol succinate 50 MG 24 hr tablet Commonly known as: TOPROL-XL Take 1 tablet (50 mg total) by mouth daily. Take with or immediately following a meal.   multivitamin with minerals Tabs tablet Take 1 tablet by mouth daily.   Nasacort Allergy 24HR 55 MCG/ACT Aero nasal inhaler Generic drug: triamcinolone Place 2 sprays into the nose daily as needed (allergies).   omeprazole 40 MG capsule Commonly known as: PRILOSEC Take 40 mg by mouth at bedtime.   pioglitazone 45 MG tablet Commonly known as: ACTOS TAKE 1 TABLET BY MOUTH EVERY DAY   pravastatin 40 MG tablet Commonly known as: PRAVACHOL Take 1 tablet (40 mg total) by mouth daily.   prednisoLONE acetate 1 % ophthalmic suspension Commonly known as: PRED FORTE Place 1 drop into both eyes See admin instructions. Place 1 drop into left eye four times daily Place 1 drop into right eye twice daily Notes to patient: Per schedule   PROBIOTIC-10 PO Take 1 capsule by mouth daily.   senna-docusate 8.6-50 MG tablet Commonly known as:  Senokot-S Take 1 tablet by mouth at bedtime. Notes to patient: Over the counter   sitaGLIPtin 100 MG tablet Commonly known as: Januvia Take 1 tablet (100 mg total) by mouth daily.   vitamin C 1000 MG tablet Take 1,000 mg by mouth 2 (two) times daily as needed.   Vitamin D 50 MCG (2000 UT) Caps Take 2 capsules by mouth daily.      Follow-up Information    Lovorn, Jinny Blossom, MD Follow up.   Specialty: Physical Medicine and Rehabilitation Why: office will call you with appointment Contact information: [id number] [place] [address] [phone]        Ashok Pall, MD. Call.   Specialty: Neurosurgery Why: for post op appointment Contact information: [address] [place] [address] [phone]        Caren Macadam, MD Follow up.   Specialty: Family Medicine Contact information: [place] [address] [phone]           Signed: Bary Leriche 10/10/2019, 10:09 AM

## 2019-10-07 NOTE — Discharge Instructions (Signed)
   Inpatient Rehab Discharge Instructions  Doris Jackson Discharge date and time: 10/07/19   Activities/Precautions/ Functional Status: Activity: no lifting, driving, or strenuous exercise till cleared by MD Diet: diabetic diet Wound Care: keep wound clean and dry   Functional status:  ___ No restrictions     ___ Walk up steps independently _X__ 24/7 supervision/assistance   ___ Walk up steps with assistance ___ Intermittent supervision/assistance  ___ Bathe/dress independently ___ Walk with walker     ___ Bathe/dress with assistance ___ Walk Independently    ___ Shower independently ___ Walk with assistance    _X__ Shower with assistance _X__ No alcohol     ___ Return to work/school ________   Special Instructions: 1. Wear collar at all times. 2. No lifting items over 5 lbs. No driving or strenuous activity.   COMMUNITY REFERRALS UPON DISCHARGE:    Home Health:   PT     OT    SNA                 Agency: [facility] Phone: [phone]     Medical Equipment/Items Ordered: rolling walker; patient to private pay/purchase for tub transfer bench                                                 Agency/Supplier: [facility] [phone]     My questions have been answered and I understand these instructions. I will adhere to these goals and the provided educational materials after my discharge from the hospital.  Patient/Caregiver Signature _______________________________ Date __________  Clinician Signature _______________________________________ Date __________  Please bring this form and your medication list with you to all your follow-up doctor's appointments.

## 2019-10-07 NOTE — Progress Notes (Signed)
Social Work Discharge Note   The overall goal for the admission was met for:   Discharge location: Yes. Pt to d/c to home with assistance from husband and dtr.   Length of Stay: Yes. 15 days.  Discharge activity level: Yes. Mod I  Home/community participation: Yes  Services provided included: MD, RD, PT, OT, RN, CM, TR, Pharmacy, Neuropsych and SW  Financial Services: Private Insurance: [employer] Medicare  Follow-up services arranged: Home Health: [facility] PT/OT/CNA and DME: RW from [facility]; pt to purchase TTB (private pay)  Comments (or additional information): contact pt #[phone] or pt husband Dennis #[phone]  Patient/Family verbalized understanding of follow-up arrangements: Yes  Individual responsible for coordination of the follow-up plan: Pt will have assistance from family to coordinate pt care needs.   Confirmed correct DME delivered:  A  10/07/2019     A  

## 2019-10-07 NOTE — Progress Notes (Signed)
Social Work Patient ID: [name], female   DOB: 1940/09/28, 79 y.o.   MRN: [id number]   SW met with pt and pt husband in room to follow-up about concerns about HHA. Pt stated that she was waiting for SW to follow-up about HHA, and would like to change agencies. SW informed pt she stated on Tuesday she would like to have [facility], and would now like [facility]. SW explained if switching agencies and pt is likely to lose the current referral. Pt amenable to remain with the agency. DME: RW not here by pt desired time to leave. Family will come back to hospital to pick up item.   *Pt husband Simona Huh [phone]) came back to pick up RW.  Loralee Pacas, MSW, [place] Office: [phone] Cell: [phone] Fax: [phone]

## 2019-10-07 NOTE — Plan of Care (Signed)
Pt to d/c to home with family, personal items collected. Discharge instructions provided by Algis Liming, PA. Discussed with pt follow up appts and pt verbalizes an understanding of medications, and follow up appts.

## 2019-10-08 DIAGNOSIS — E785 Hyperlipidemia, unspecified: Secondary | ICD-10-CM | POA: Diagnosis not present

## 2019-10-08 DIAGNOSIS — M4802 Spinal stenosis, cervical region: Secondary | ICD-10-CM | POA: Diagnosis not present

## 2019-10-08 DIAGNOSIS — M1990 Unspecified osteoarthritis, unspecified site: Secondary | ICD-10-CM | POA: Diagnosis not present

## 2019-10-08 DIAGNOSIS — D649 Anemia, unspecified: Secondary | ICD-10-CM | POA: Diagnosis not present

## 2019-10-08 DIAGNOSIS — G9520 Unspecified cord compression: Secondary | ICD-10-CM | POA: Diagnosis not present

## 2019-10-08 DIAGNOSIS — E119 Type 2 diabetes mellitus without complications: Secondary | ICD-10-CM | POA: Diagnosis not present

## 2019-10-08 DIAGNOSIS — J449 Chronic obstructive pulmonary disease, unspecified: Secondary | ICD-10-CM | POA: Diagnosis not present

## 2019-10-08 DIAGNOSIS — M5000 Cervical disc disorder with myelopathy, unspecified cervical region: Secondary | ICD-10-CM | POA: Diagnosis not present

## 2019-10-08 DIAGNOSIS — I10 Essential (primary) hypertension: Secondary | ICD-10-CM | POA: Diagnosis not present

## 2019-10-08 DIAGNOSIS — Z4789 Encounter for other orthopedic aftercare: Secondary | ICD-10-CM | POA: Diagnosis not present

## 2019-10-11 ENCOUNTER — Telehealth: Payer: Self-pay | Admitting: Registered Nurse

## 2019-10-11 ENCOUNTER — Telehealth: Payer: Self-pay | Admitting: *Deleted

## 2019-10-11 DIAGNOSIS — E785 Hyperlipidemia, unspecified: Secondary | ICD-10-CM | POA: Diagnosis not present

## 2019-10-11 DIAGNOSIS — Z4789 Encounter for other orthopedic aftercare: Secondary | ICD-10-CM | POA: Diagnosis not present

## 2019-10-11 DIAGNOSIS — D649 Anemia, unspecified: Secondary | ICD-10-CM | POA: Diagnosis not present

## 2019-10-11 DIAGNOSIS — I10 Essential (primary) hypertension: Secondary | ICD-10-CM | POA: Diagnosis not present

## 2019-10-11 DIAGNOSIS — M5000 Cervical disc disorder with myelopathy, unspecified cervical region: Secondary | ICD-10-CM | POA: Diagnosis not present

## 2019-10-11 DIAGNOSIS — J449 Chronic obstructive pulmonary disease, unspecified: Secondary | ICD-10-CM | POA: Diagnosis not present

## 2019-10-11 DIAGNOSIS — E119 Type 2 diabetes mellitus without complications: Secondary | ICD-10-CM | POA: Diagnosis not present

## 2019-10-11 DIAGNOSIS — M4802 Spinal stenosis, cervical region: Secondary | ICD-10-CM | POA: Diagnosis not present

## 2019-10-11 DIAGNOSIS — G9520 Unspecified cord compression: Secondary | ICD-10-CM | POA: Diagnosis not present

## 2019-10-11 DIAGNOSIS — M1990 Unspecified osteoarthritis, unspecified site: Secondary | ICD-10-CM | POA: Diagnosis not present

## 2019-10-11 NOTE — Telephone Encounter (Signed)
Mariella Saa, PT, [facility] contacted our office.  I spoke to her over the phone direct.  She is asking for verbal orders for HHPT 1wk1, 2wk3. Medical record reviewed. Social work note reviewed.  Verbal orders given per office protocol.

## 2019-10-11 NOTE — Telephone Encounter (Signed)
Transitional Care call  Patient name: Doris Jackson DOB: 1940-12-07 1. Are you/is patient experiencing any problems since coming home? No a. Are there any questions regarding any aspect of care? No 2. Are there any questions regarding medications administration/dosing? No a. Are meds being taken as prescribed? Yes b. "Patient should review meds with caller to confirm" Medication List Reviewed.  3. Have there been any falls? No 4. Has Home Health been to the house and/or have they contacted you? Yes, [facility]. a. If not, have you tried to contact them? NA b. Can we help you contact them? NA 5. Are bowels and bladder emptying properly? Yes a. Are there any unexpected incontinence issues? No b. If applicable, is patient following bowel/bladder programs? NA 6. Any fevers, problems with breathing, unexpected pain? No 7. Are there any skin problems or new areas of breakdown? No 8. Has the patient/family member arranged specialty MD follow up (ie cardiology/neurology/renal/surgical/etc.)?  [name] was instructed to call Dr. Christella Noa and Dr. Ethlyn Gallery to schedule HFU appointments, she verbalizes understanding.  a. Can we help arrange? No 9. Does the patient need any other services or support that we can help arrange? No 10. Are caregivers following through as expected in assisting the patient? Yes 11. Has the patient quit smoking, drinking alcohol, or using drugs as recommended? (                        )  Appointment date/time 10/19/2019  arrival time 9:20 for 9:40 with Bayard Hugger ANP-C. At [place]

## 2019-10-12 DIAGNOSIS — E785 Hyperlipidemia, unspecified: Secondary | ICD-10-CM | POA: Diagnosis not present

## 2019-10-12 DIAGNOSIS — G9520 Unspecified cord compression: Secondary | ICD-10-CM | POA: Diagnosis not present

## 2019-10-12 DIAGNOSIS — M5000 Cervical disc disorder with myelopathy, unspecified cervical region: Secondary | ICD-10-CM | POA: Diagnosis not present

## 2019-10-12 DIAGNOSIS — D649 Anemia, unspecified: Secondary | ICD-10-CM | POA: Diagnosis not present

## 2019-10-12 DIAGNOSIS — M1990 Unspecified osteoarthritis, unspecified site: Secondary | ICD-10-CM | POA: Diagnosis not present

## 2019-10-12 DIAGNOSIS — M4802 Spinal stenosis, cervical region: Secondary | ICD-10-CM | POA: Diagnosis not present

## 2019-10-12 DIAGNOSIS — E119 Type 2 diabetes mellitus without complications: Secondary | ICD-10-CM | POA: Diagnosis not present

## 2019-10-12 DIAGNOSIS — I10 Essential (primary) hypertension: Secondary | ICD-10-CM | POA: Diagnosis not present

## 2019-10-12 DIAGNOSIS — Z4789 Encounter for other orthopedic aftercare: Secondary | ICD-10-CM | POA: Diagnosis not present

## 2019-10-12 DIAGNOSIS — J449 Chronic obstructive pulmonary disease, unspecified: Secondary | ICD-10-CM | POA: Diagnosis not present

## 2019-10-13 ENCOUNTER — Telehealth: Payer: Self-pay | Admitting: *Deleted

## 2019-10-13 DIAGNOSIS — E119 Type 2 diabetes mellitus without complications: Secondary | ICD-10-CM | POA: Diagnosis not present

## 2019-10-13 DIAGNOSIS — J449 Chronic obstructive pulmonary disease, unspecified: Secondary | ICD-10-CM | POA: Diagnosis not present

## 2019-10-13 DIAGNOSIS — G9520 Unspecified cord compression: Secondary | ICD-10-CM | POA: Diagnosis not present

## 2019-10-13 DIAGNOSIS — E785 Hyperlipidemia, unspecified: Secondary | ICD-10-CM | POA: Diagnosis not present

## 2019-10-13 DIAGNOSIS — M4802 Spinal stenosis, cervical region: Secondary | ICD-10-CM | POA: Diagnosis not present

## 2019-10-13 DIAGNOSIS — M5000 Cervical disc disorder with myelopathy, unspecified cervical region: Secondary | ICD-10-CM | POA: Diagnosis not present

## 2019-10-13 DIAGNOSIS — Z4789 Encounter for other orthopedic aftercare: Secondary | ICD-10-CM | POA: Diagnosis not present

## 2019-10-13 DIAGNOSIS — M1990 Unspecified osteoarthritis, unspecified site: Secondary | ICD-10-CM | POA: Diagnosis not present

## 2019-10-13 DIAGNOSIS — I10 Essential (primary) hypertension: Secondary | ICD-10-CM | POA: Diagnosis not present

## 2019-10-13 DIAGNOSIS — D649 Anemia, unspecified: Secondary | ICD-10-CM | POA: Diagnosis not present

## 2019-10-13 NOTE — Telephone Encounter (Signed)
Spoke with Erlene Quan and he was informed of the verbal orders as below.

## 2019-10-13 NOTE — Telephone Encounter (Signed)
ok 

## 2019-10-13 NOTE — Telephone Encounter (Signed)
Erlene Quan is called for verbal orders for OT home health orders

## 2019-10-14 DIAGNOSIS — G9520 Unspecified cord compression: Secondary | ICD-10-CM | POA: Diagnosis not present

## 2019-10-14 DIAGNOSIS — Z7984 Long term (current) use of oral hypoglycemic drugs: Secondary | ICD-10-CM

## 2019-10-14 DIAGNOSIS — M4802 Spinal stenosis, cervical region: Secondary | ICD-10-CM | POA: Diagnosis not present

## 2019-10-14 DIAGNOSIS — E119 Type 2 diabetes mellitus without complications: Secondary | ICD-10-CM | POA: Diagnosis not present

## 2019-10-14 DIAGNOSIS — Z9181 History of falling: Secondary | ICD-10-CM

## 2019-10-14 DIAGNOSIS — M5000 Cervical disc disorder with myelopathy, unspecified cervical region: Secondary | ICD-10-CM | POA: Diagnosis not present

## 2019-10-14 DIAGNOSIS — M1990 Unspecified osteoarthritis, unspecified site: Secondary | ICD-10-CM | POA: Diagnosis not present

## 2019-10-14 DIAGNOSIS — I10 Essential (primary) hypertension: Secondary | ICD-10-CM | POA: Diagnosis not present

## 2019-10-14 DIAGNOSIS — D649 Anemia, unspecified: Secondary | ICD-10-CM | POA: Diagnosis not present

## 2019-10-14 DIAGNOSIS — E785 Hyperlipidemia, unspecified: Secondary | ICD-10-CM | POA: Diagnosis not present

## 2019-10-14 DIAGNOSIS — J449 Chronic obstructive pulmonary disease, unspecified: Secondary | ICD-10-CM | POA: Diagnosis not present

## 2019-10-14 DIAGNOSIS — Z4789 Encounter for other orthopedic aftercare: Secondary | ICD-10-CM | POA: Diagnosis not present

## 2019-10-14 DIAGNOSIS — R1310 Dysphagia, unspecified: Secondary | ICD-10-CM

## 2019-10-17 DIAGNOSIS — M1990 Unspecified osteoarthritis, unspecified site: Secondary | ICD-10-CM | POA: Diagnosis not present

## 2019-10-17 DIAGNOSIS — M5000 Cervical disc disorder with myelopathy, unspecified cervical region: Secondary | ICD-10-CM | POA: Diagnosis not present

## 2019-10-17 DIAGNOSIS — J449 Chronic obstructive pulmonary disease, unspecified: Secondary | ICD-10-CM | POA: Diagnosis not present

## 2019-10-17 DIAGNOSIS — I10 Essential (primary) hypertension: Secondary | ICD-10-CM | POA: Diagnosis not present

## 2019-10-17 DIAGNOSIS — Z4789 Encounter for other orthopedic aftercare: Secondary | ICD-10-CM | POA: Diagnosis not present

## 2019-10-17 DIAGNOSIS — M4802 Spinal stenosis, cervical region: Secondary | ICD-10-CM | POA: Diagnosis not present

## 2019-10-17 DIAGNOSIS — G9520 Unspecified cord compression: Secondary | ICD-10-CM | POA: Diagnosis not present

## 2019-10-17 DIAGNOSIS — D649 Anemia, unspecified: Secondary | ICD-10-CM | POA: Diagnosis not present

## 2019-10-17 DIAGNOSIS — E785 Hyperlipidemia, unspecified: Secondary | ICD-10-CM | POA: Diagnosis not present

## 2019-10-17 DIAGNOSIS — E119 Type 2 diabetes mellitus without complications: Secondary | ICD-10-CM | POA: Diagnosis not present

## 2019-10-18 ENCOUNTER — Other Ambulatory Visit: Payer: Self-pay | Admitting: Neurosurgery

## 2019-10-18 DIAGNOSIS — Z6839 Body mass index (BMI) 39.0-39.9, adult: Secondary | ICD-10-CM | POA: Diagnosis not present

## 2019-10-18 DIAGNOSIS — I10 Essential (primary) hypertension: Secondary | ICD-10-CM | POA: Diagnosis not present

## 2019-10-18 DIAGNOSIS — T84216A Breakdown (mechanical) of internal fixation device of vertebrae, initial encounter: Secondary | ICD-10-CM | POA: Diagnosis not present

## 2019-10-18 DIAGNOSIS — M542 Cervicalgia: Secondary | ICD-10-CM | POA: Diagnosis not present

## 2019-10-19 ENCOUNTER — Encounter: Payer: Medicare HMO | Attending: Registered Nurse | Admitting: Registered Nurse

## 2019-10-19 ENCOUNTER — Other Ambulatory Visit (HOSPITAL_COMMUNITY)
Admission: RE | Admit: 2019-10-19 | Discharge: 2019-10-19 | Disposition: A | Discharge status: Home / Self Care | Payer: Medicare HMO | Source: Ambulatory Visit | Attending: Neurosurgery | Admitting: Neurosurgery

## 2019-10-19 ENCOUNTER — Encounter (HOSPITAL_COMMUNITY): Payer: Self-pay | Admitting: Neurosurgery

## 2019-10-19 DIAGNOSIS — Z01812 Encounter for preprocedural laboratory examination: Secondary | ICD-10-CM | POA: Diagnosis not present

## 2019-10-19 DIAGNOSIS — Z20822 Contact with and (suspected) exposure to covid-19: Secondary | ICD-10-CM | POA: Insufficient documentation

## 2019-10-19 LAB — SARS CORONAVIRUS 2 (TAT 6-24 HRS): SARS Coronavirus 2: NEGATIVE

## 2019-10-20 ENCOUNTER — Encounter (HOSPITAL_COMMUNITY): Payer: Self-pay | Admitting: Neurosurgery

## 2019-10-20 ENCOUNTER — Other Ambulatory Visit: Payer: Self-pay

## 2019-10-20 NOTE — Progress Notes (Signed)
Mrs Doris Jackson denies chest pain or shortness of breath. Patient tested negative for COvid 10/19/2019, she went to an eye appointment wearing a mask and distancing , has been in quarantine other than that.  Mrs. Doris Jackson has type II diabetes. She reports that today's fasting CBG was 107 and afternoon CBG's run around -140. I instructed patient to check CBG after awaking and every 2 hours until arrival  to the hospital.  I Instructed patient if CBG is less than 70 to drink  1/2 cup of a clear juice. Recheck CBG in 15 minutes then call pre- op desk at [phone] for further instructions.

## 2019-10-21 ENCOUNTER — Encounter (HOSPITAL_COMMUNITY): Payer: Self-pay | Admitting: Neurosurgery

## 2019-10-21 ENCOUNTER — Ambulatory Visit (HOSPITAL_COMMUNITY): Payer: Medicare HMO | Admitting: Certified Registered"

## 2019-10-21 ENCOUNTER — Ambulatory Visit (HOSPITAL_COMMUNITY): Payer: Medicare HMO

## 2019-10-21 ENCOUNTER — Encounter (HOSPITAL_COMMUNITY)
Admission: RE | Disposition: A | Discharge status: Home / Self Care | Payer: Self-pay | Source: Home / Self Care | Attending: Neurosurgery

## 2019-10-21 ENCOUNTER — Other Ambulatory Visit: Payer: Self-pay

## 2019-10-21 ENCOUNTER — Observation Stay (HOSPITAL_COMMUNITY)
Admission: RE | Admit: 2019-10-21 | Discharge: 2019-10-24 | Disposition: A | Discharge status: Home / Self Care | Payer: Medicare HMO | Attending: Neurosurgery | Admitting: Neurosurgery

## 2019-10-21 DIAGNOSIS — M4322 Fusion of spine, cervical region: Secondary | ICD-10-CM | POA: Diagnosis not present

## 2019-10-21 DIAGNOSIS — J449 Chronic obstructive pulmonary disease, unspecified: Secondary | ICD-10-CM | POA: Insufficient documentation

## 2019-10-21 DIAGNOSIS — E119 Type 2 diabetes mellitus without complications: Secondary | ICD-10-CM | POA: Diagnosis not present

## 2019-10-21 DIAGNOSIS — Z6839 Body mass index (BMI) 39.0-39.9, adult: Secondary | ICD-10-CM | POA: Insufficient documentation

## 2019-10-21 DIAGNOSIS — Z853 Personal history of malignant neoplasm of breast: Secondary | ICD-10-CM | POA: Insufficient documentation

## 2019-10-21 DIAGNOSIS — M8580 Other specified disorders of bone density and structure, unspecified site: Secondary | ICD-10-CM | POA: Insufficient documentation

## 2019-10-21 DIAGNOSIS — Z7984 Long term (current) use of oral hypoglycemic drugs: Secondary | ICD-10-CM | POA: Diagnosis not present

## 2019-10-21 DIAGNOSIS — I10 Essential (primary) hypertension: Secondary | ICD-10-CM | POA: Insufficient documentation

## 2019-10-21 DIAGNOSIS — T84216A Breakdown (mechanical) of internal fixation device of vertebrae, initial encounter: Secondary | ICD-10-CM | POA: Diagnosis not present

## 2019-10-21 DIAGNOSIS — K219 Gastro-esophageal reflux disease without esophagitis: Secondary | ICD-10-CM | POA: Insufficient documentation

## 2019-10-21 DIAGNOSIS — X58XXXA Exposure to other specified factors, initial encounter: Secondary | ICD-10-CM | POA: Diagnosis not present

## 2019-10-21 DIAGNOSIS — E785 Hyperlipidemia, unspecified: Secondary | ICD-10-CM | POA: Insufficient documentation

## 2019-10-21 DIAGNOSIS — M4722 Other spondylosis with radiculopathy, cervical region: Secondary | ICD-10-CM | POA: Diagnosis not present

## 2019-10-21 DIAGNOSIS — T84296A Other mechanical complication of internal fixation device of vertebrae, initial encounter: Secondary | ICD-10-CM | POA: Diagnosis not present

## 2019-10-21 DIAGNOSIS — M1990 Unspecified osteoarthritis, unspecified site: Secondary | ICD-10-CM | POA: Diagnosis not present

## 2019-10-21 DIAGNOSIS — Z79899 Other long term (current) drug therapy: Secondary | ICD-10-CM | POA: Diagnosis not present

## 2019-10-21 DIAGNOSIS — Z981 Arthrodesis status: Secondary | ICD-10-CM | POA: Diagnosis not present

## 2019-10-21 DIAGNOSIS — N809 Endometriosis, unspecified: Secondary | ICD-10-CM | POA: Diagnosis not present

## 2019-10-21 HISTORY — PX: HARDWARE REMOVAL: SHX979

## 2019-10-21 HISTORY — DX: Gastro-esophageal reflux disease without esophagitis: K21.9

## 2019-10-21 HISTORY — DX: Breakdown (mechanical) of internal fixation device of vertebrae, initial encounter: T84.216A

## 2019-10-21 LAB — BASIC METABOLIC PANEL
Anion gap: 10 (ref 5–15)
BUN: 12 mg/dL (ref 8–23)
CO2: 24 mmol/L (ref 22–32)
Calcium: 9 mg/dL (ref 8.9–10.3)
Chloride: 106 mmol/L (ref 98–111)
Creatinine, Ser: 0.84 mg/dL (ref 0.44–1.00)
GFR calc Af Amer: >60 mL/min (ref >60)
GFR calc non Af Amer: >60 mL/min (ref >60)
Glucose, Bld: 114 mg/dL — ABNORMAL HIGH (ref 70–99)
Potassium: 3.5 mmol/L (ref 3.5–5.1)
Sodium: 140 mmol/L (ref 135–145)

## 2019-10-21 LAB — CBC
HCT: 32.2 % — ABNORMAL LOW (ref 36.0–46.0)
Hemoglobin: 10.5 g/dL — ABNORMAL LOW (ref 12.0–15.0)
MCH: 27 pg (ref 26.0–34.0)
MCHC: 32.6 g/dL (ref 30.0–36.0)
MCV: 82.8 fL (ref 80.0–100.0)
Platelets: 172 10*3/uL (ref 150–400)
RBC: 3.89 MIL/uL (ref 3.87–5.11)
RDW: 17 % — ABNORMAL HIGH (ref 11.5–15.5)
WBC: 6.8 10*3/uL (ref 4.0–10.5)
nRBC: 0 % (ref 0.0–0.2)

## 2019-10-21 LAB — GLUCOSE, CAPILLARY
Glucose-Capillary: 104 mg/dL — ABNORMAL HIGH (ref 70–99)
Glucose-Capillary: 100 mg/dL — ABNORMAL HIGH (ref 70–99)
Glucose-Capillary: 169 mg/dL — ABNORMAL HIGH (ref 70–99)
Glucose-Capillary: 182 mg/dL — ABNORMAL HIGH (ref 70–99)

## 2019-10-21 LAB — TYPE AND SCREEN
ABO/RH(D): B POS
Antibody Screen: NEGATIVE

## 2019-10-21 SURGERY — REMOVAL, HARDWARE
Anesthesia: General

## 2019-10-21 MED ORDER — CHLORHEXIDINE GLUCONATE CLOTH 2 % EX PADS: 6.0000 | MEDICATED_PAD | Freq: Once | CUTANEOUS | Status: DC

## 2019-10-21 MED ORDER — ACETAMINOPHEN 325 MG PO TABS
650.0000 mg | ORAL_TABLET | ORAL | Status: DC | PRN
  Administered 2019-10-21: 23:00:00 650 mg via ORAL
  Filled 2019-10-21: qty 2

## 2019-10-21 MED ORDER — MORPHINE SULFATE (PF) 2 MG/ML IV SOLN: 1.0000 mg | INTRAVENOUS | Status: DC | PRN

## 2019-10-21 MED ORDER — PROPOFOL 10 MG/ML IV BOLUS
INTRAVENOUS | Status: AC
  Filled 2019-10-21: qty 20

## 2019-10-21 MED ORDER — SODIUM CHLORIDE 0.9% FLUSH: 3.0000 mL | INTRAVENOUS | Status: DC | PRN

## 2019-10-21 MED ORDER — ONDANSETRON HCL 4 MG/2ML IJ SOLN
INTRAMUSCULAR | Status: AC
  Filled 2019-10-21: qty 2

## 2019-10-21 MED ORDER — HYDROCODONE-ACETAMINOPHEN 7.5-325 MG PO TABS: 1.0000 | ORAL_TABLET | ORAL | Status: DC | PRN

## 2019-10-21 MED ORDER — PRAVASTATIN SODIUM 40 MG PO TABS
40.0000 mg | ORAL_TABLET | ORAL | Status: DC
  Administered 2019-10-24: 12:00:00 40 mg via ORAL
  Filled 2019-10-21: qty 1

## 2019-10-21 MED ORDER — HYDROMORPHONE HCL 1 MG/ML IJ SOLN
0.2500 mg | INTRAMUSCULAR | Status: DC | PRN
  Administered 2019-10-21 (×2): 0.5 mg via INTRAVENOUS

## 2019-10-21 MED ORDER — HYDROMORPHONE HCL 1 MG/ML IJ SOLN
INTRAMUSCULAR | Status: AC
  Filled 2019-10-21: qty 1

## 2019-10-21 MED ORDER — PHENYLEPHRINE HCL-NACL 10-0.9 MG/250ML-% IV SOLN
INTRAVENOUS | Status: DC | PRN
  Administered 2019-10-21: 25 ug/min via INTRAVENOUS

## 2019-10-21 MED ORDER — SUGAMMADEX SODIUM 200 MG/2ML IV SOLN
INTRAVENOUS | Status: DC | PRN
  Administered 2019-10-21: 200 mg via INTRAVENOUS

## 2019-10-21 MED ORDER — METFORMIN HCL ER 500 MG PO TB24
500.0000 mg | ORAL_TABLET | Freq: Two times a day (BID) | ORAL | Status: DC
  Administered 2019-10-22 – 2019-10-24 (×5): 500 mg via ORAL
  Filled 2019-10-21 (×9): qty 1

## 2019-10-21 MED ORDER — CEFAZOLIN SODIUM-DEXTROSE 1-4 GM/50ML-% IV SOLN
1.0000 g | Freq: Three times a day (TID) | INTRAVENOUS | Status: AC
  Administered 2019-10-21 – 2019-10-22 (×2): 1 g via INTRAVENOUS
  Filled 2019-10-21 (×2): qty 50

## 2019-10-21 MED ORDER — SODIUM CHLORIDE 0.9% FLUSH
3.0000 mL | Freq: Two times a day (BID) | INTRAVENOUS | Status: DC
  Administered 2019-10-21 – 2019-10-24 (×5): 3 mL via INTRAVENOUS

## 2019-10-21 MED ORDER — ASCORBIC ACID 500 MG PO TABS
1000.0000 mg | ORAL_TABLET | Freq: Every day | ORAL | Status: DC
  Administered 2019-10-22 – 2019-10-24 (×3): 1000 mg via ORAL
  Filled 2019-10-21 (×3): qty 2

## 2019-10-21 MED ORDER — PANTOPRAZOLE SODIUM 40 MG PO TBEC
80.0000 mg | DELAYED_RELEASE_TABLET | Freq: Every day | ORAL | Status: DC
  Administered 2019-10-22 – 2019-10-24 (×3): 80 mg via ORAL
  Filled 2019-10-21 (×3): qty 2

## 2019-10-21 MED ORDER — DULOXETINE HCL 20 MG PO CPEP
20.0000 mg | ORAL_CAPSULE | Freq: Every day | ORAL | Status: DC
  Administered 2019-10-21 – 2019-10-23 (×3): 20 mg via ORAL
  Filled 2019-10-21 (×5): qty 1

## 2019-10-21 MED ORDER — POTASSIUM CHLORIDE IN NACL 20-0.9 MEQ/L-% IV SOLN
INTRAVENOUS | Status: DC
  Filled 2019-10-21: qty 1000

## 2019-10-21 MED ORDER — PROPOFOL 10 MG/ML IV BOLUS
INTRAVENOUS | Status: DC | PRN
  Administered 2019-10-21: 100 mg via INTRAVENOUS

## 2019-10-21 MED ORDER — LACTATED RINGERS IV SOLN: INTRAVENOUS | Status: DC

## 2019-10-21 MED ORDER — MENTHOL 3 MG MT LOZG: 1.0000 | LOZENGE | OROMUCOSAL | Status: DC | PRN

## 2019-10-21 MED ORDER — ACETAMINOPHEN 10 MG/ML IV SOLN
INTRAVENOUS | Status: AC
  Filled 2019-10-21: qty 100

## 2019-10-21 MED ORDER — INSULIN ASPART 100 UNIT/ML ~~LOC~~ SOLN
0.0000 [IU] | Freq: Three times a day (TID) | SUBCUTANEOUS | Status: DC
  Administered 2019-10-22: 18:00:00 7 [IU] via SUBCUTANEOUS
  Administered 2019-10-22 (×2): 4 [IU] via SUBCUTANEOUS
  Administered 2019-10-23: 07:00:00 7 [IU] via SUBCUTANEOUS
  Administered 2019-10-23: 12:00:00 4 [IU] via SUBCUTANEOUS
  Administered 2019-10-23: 18:00:00 7 [IU] via SUBCUTANEOUS
  Administered 2019-10-24: 07:00:00 4 [IU] via SUBCUTANEOUS

## 2019-10-21 MED ORDER — LIDOCAINE HCL (PF) 0.5 % IJ SOLN
INTRAMUSCULAR | Status: AC
  Filled 2019-10-21: qty 50

## 2019-10-21 MED ORDER — PIOGLITAZONE HCL 45 MG PO TABS
45.0000 mg | ORAL_TABLET | Freq: Every day | ORAL | Status: DC
  Administered 2019-10-22 – 2019-10-24 (×3): 45 mg via ORAL
  Filled 2019-10-21 (×5): qty 1

## 2019-10-21 MED ORDER — ADULT MULTIVITAMIN W/MINERALS CH
1.0000 | ORAL_TABLET | Freq: Every day | ORAL | Status: DC
  Administered 2019-10-22 – 2019-10-24 (×3): 1 via ORAL
  Filled 2019-10-21 (×3): qty 1

## 2019-10-21 MED ORDER — OXYCODONE HCL 5 MG PO TABS: 10.0000 mg | ORAL_TABLET | ORAL | Status: DC | PRN

## 2019-10-21 MED ORDER — 0.9 % SODIUM CHLORIDE (POUR BTL) OPTIME
TOPICAL | Status: DC | PRN
  Administered 2019-10-21: 15:00:00 1000 mL

## 2019-10-21 MED ORDER — PREDNISOLONE ACETATE 1 % OP SUSP: 1.0000 [drp] | OPHTHALMIC | Status: DC

## 2019-10-21 MED ORDER — ONDANSETRON HCL 4 MG/2ML IJ SOLN: 4.0000 mg | Freq: Four times a day (QID) | INTRAMUSCULAR | Status: DC | PRN

## 2019-10-21 MED ORDER — LINAGLIPTIN 5 MG PO TABS
5.0000 mg | ORAL_TABLET | Freq: Every day | ORAL | Status: DC
  Administered 2019-10-22 – 2019-10-24 (×3): 5 mg via ORAL
  Filled 2019-10-21 (×3): qty 1

## 2019-10-21 MED ORDER — HYDROCODONE-ACETAMINOPHEN 7.5-325 MG PO TABS
ORAL_TABLET | ORAL | Status: AC
  Filled 2019-10-21: qty 1

## 2019-10-21 MED ORDER — CEFAZOLIN SODIUM-DEXTROSE 2-4 GM/100ML-% IV SOLN
2.0000 g | INTRAVENOUS | Status: AC
  Administered 2019-10-21: 2 g via INTRAVENOUS

## 2019-10-21 MED ORDER — METOPROLOL SUCCINATE ER 50 MG PO TB24
50.0000 mg | ORAL_TABLET | Freq: Every day | ORAL | Status: DC
  Administered 2019-10-22 – 2019-10-24 (×3): 50 mg via ORAL
  Filled 2019-10-21 (×3): qty 1

## 2019-10-21 MED ORDER — VITAMIN B-12 1000 MCG PO TABS
2000.0000 ug | ORAL_TABLET | Freq: Every day | ORAL | Status: DC
  Administered 2019-10-22 – 2019-10-24 (×3): 2000 ug via ORAL
  Filled 2019-10-21 (×3): qty 2

## 2019-10-21 MED ORDER — GLIPIZIDE ER 5 MG PO TB24
5.0000 mg | ORAL_TABLET | Freq: Every day | ORAL | Status: DC
  Administered 2019-10-22 – 2019-10-24 (×3): 5 mg via ORAL
  Filled 2019-10-21 (×5): qty 1

## 2019-10-21 MED ORDER — ACETAMINOPHEN 650 MG RE SUPP: 650.0000 mg | RECTAL | Status: DC | PRN

## 2019-10-21 MED ORDER — THROMBIN 5000 UNITS EX SOLR
CUTANEOUS | Status: DC | PRN
  Administered 2019-10-21 (×2): 5000 [IU] via TOPICAL

## 2019-10-21 MED ORDER — SODIUM CHLORIDE 0.9 % IV SOLN
250.0000 mL | INTRAVENOUS | Status: DC
  Administered 2019-10-21: 23:00:00 250 mL via INTRAVENOUS

## 2019-10-21 MED ORDER — AMLODIPINE BESYLATE 10 MG PO TABS
10.0000 mg | ORAL_TABLET | Freq: Every day | ORAL | Status: DC
  Administered 2019-10-22 – 2019-10-24 (×3): 10 mg via ORAL
  Filled 2019-10-21 (×3): qty 1

## 2019-10-21 MED ORDER — DIAZEPAM 5 MG PO TABS: 5.0000 mg | ORAL_TABLET | Freq: Four times a day (QID) | ORAL | Status: DC | PRN

## 2019-10-21 MED ORDER — DEXAMETHASONE SODIUM PHOSPHATE 10 MG/ML IJ SOLN
INTRAMUSCULAR | Status: AC
  Filled 2019-10-21: qty 1

## 2019-10-21 MED ORDER — LORATADINE 10 MG PO TABS
10.0000 mg | ORAL_TABLET | Freq: Every day | ORAL | Status: DC
  Administered 2019-10-22 – 2019-10-24 (×3): 10 mg via ORAL
  Filled 2019-10-21 (×3): qty 1

## 2019-10-21 MED ORDER — FENTANYL CITRATE (PF) 250 MCG/5ML IJ SOLN
INTRAMUSCULAR | Status: AC
  Filled 2019-10-21: qty 5

## 2019-10-21 MED ORDER — PREDNISOLONE ACETATE 1 % OP SUSP
1.0000 [drp] | Freq: Four times a day (QID) | OPHTHALMIC | Status: DC
  Administered 2019-10-21 – 2019-10-24 (×11): 1 [drp] via OPHTHALMIC
  Filled 2019-10-21 (×2): qty 5

## 2019-10-21 MED ORDER — CELECOXIB 200 MG PO CAPS
200.0000 mg | ORAL_CAPSULE | Freq: Two times a day (BID) | ORAL | Status: DC
  Administered 2019-10-21 – 2019-10-24 (×6): 200 mg via ORAL
  Filled 2019-10-21 (×6): qty 1

## 2019-10-21 MED ORDER — PREDNISOLONE ACETATE 1 % OP SUSP
1.0000 [drp] | Freq: Two times a day (BID) | OPHTHALMIC | Status: DC
  Administered 2019-10-21 – 2019-10-24 (×6): 1 [drp] via OPHTHALMIC

## 2019-10-21 MED ORDER — POLYSACCHARIDE IRON COMPLEX 150 MG PO CAPS
150.0000 mg | ORAL_CAPSULE | Freq: Every day | ORAL | Status: DC
  Administered 2019-10-22 – 2019-10-24 (×3): 150 mg via ORAL
  Filled 2019-10-21 (×3): qty 1

## 2019-10-21 MED ORDER — ROCURONIUM BROMIDE 10 MG/ML (PF) SYRINGE
PREFILLED_SYRINGE | INTRAVENOUS | Status: AC
  Filled 2019-10-21: qty 10

## 2019-10-21 MED ORDER — HEMOSTATIC AGENTS (NO CHARGE) OPTIME
TOPICAL | Status: DC | PRN
  Administered 2019-10-21: 1 via TOPICAL

## 2019-10-21 MED ORDER — OXYCODONE HCL ER 10 MG PO T12A
10.0000 mg | EXTENDED_RELEASE_TABLET | Freq: Two times a day (BID) | ORAL | Status: DC
  Administered 2019-10-23: 11:00:00 10 mg via ORAL
  Filled 2019-10-21 (×3): qty 1

## 2019-10-21 MED ORDER — ONDANSETRON HCL 4 MG PO TABS: 4.0000 mg | ORAL_TABLET | Freq: Four times a day (QID) | ORAL | Status: DC | PRN

## 2019-10-21 MED ORDER — THROMBIN 5000 UNITS EX SOLR
CUTANEOUS | Status: AC
  Filled 2019-10-21: qty 10000

## 2019-10-21 MED ORDER — DEXAMETHASONE SODIUM PHOSPHATE 10 MG/ML IJ SOLN
INTRAMUSCULAR | Status: DC | PRN
  Administered 2019-10-21: 10 mg via INTRAVENOUS

## 2019-10-21 MED ORDER — DEXAMETHASONE SODIUM PHOSPHATE 4 MG/ML IJ SOLN
4.0000 mg | Freq: Four times a day (QID) | INTRAMUSCULAR | Status: AC
  Administered 2019-10-21 – 2019-10-22 (×2): 4 mg via INTRAVENOUS
  Filled 2019-10-21 (×3): qty 1

## 2019-10-21 MED ORDER — ACETAMINOPHEN 500 MG PO TABS: 1000.0000 mg | ORAL_TABLET | Freq: Once | ORAL | Status: DC

## 2019-10-21 MED ORDER — ACETAMINOPHEN 10 MG/ML IV SOLN
INTRAVENOUS | Status: DC | PRN
Start: 2019-10-21 — End: 2019-10-21
  Administered 2019-10-21: 1000 mg via INTRAVENOUS

## 2019-10-21 MED ORDER — PHENOL 1.4 % MT LIQD: 1.0000 | OROMUCOSAL | Status: DC | PRN

## 2019-10-21 MED ORDER — ONDANSETRON HCL 4 MG/2ML IJ SOLN
INTRAMUSCULAR | Status: DC | PRN
  Administered 2019-10-21: 4 mg via INTRAVENOUS

## 2019-10-21 MED ORDER — FENTANYL CITRATE (PF) 100 MCG/2ML IJ SOLN
INTRAMUSCULAR | Status: DC | PRN
  Administered 2019-10-21: 25 ug via INTRAVENOUS
  Administered 2019-10-21: 100 ug via INTRAVENOUS

## 2019-10-21 MED ORDER — SENNOSIDES-DOCUSATE SODIUM 8.6-50 MG PO TABS: 1.0000 | ORAL_TABLET | Freq: Every evening | ORAL | Status: DC | PRN

## 2019-10-21 MED ORDER — DEXAMETHASONE 4 MG PO TABS
4.0000 mg | ORAL_TABLET | Freq: Four times a day (QID) | ORAL | Status: AC
  Administered 2019-10-22 – 2019-10-23 (×4): 4 mg via ORAL
  Filled 2019-10-21 (×5): qty 1

## 2019-10-21 MED ORDER — BISACODYL 5 MG PO TBEC: 5.0000 mg | DELAYED_RELEASE_TABLET | Freq: Every day | ORAL | Status: DC | PRN

## 2019-10-21 MED ORDER — ROCURONIUM BROMIDE 10 MG/ML (PF) SYRINGE
PREFILLED_SYRINGE | INTRAVENOUS | Status: DC | PRN
  Administered 2019-10-21: 60 mg via INTRAVENOUS

## 2019-10-21 MED ORDER — MAGNESIUM CITRATE PO SOLN: 1.0000 | Freq: Once | ORAL | Status: DC | PRN

## 2019-10-21 MED ORDER — LIDOCAINE 2% (20 MG/ML) 5 ML SYRINGE
INTRAMUSCULAR | Status: AC
  Filled 2019-10-21: qty 5

## 2019-10-21 MED ORDER — VITAMIN D 25 MCG (1000 UNIT) PO TABS
4000.0000 [IU] | ORAL_TABLET | Freq: Every day | ORAL | Status: DC
  Administered 2019-10-22 – 2019-10-24 (×3): 4000 [IU] via ORAL
  Filled 2019-10-21 (×3): qty 4

## 2019-10-21 MED ORDER — LIDOCAINE 2% (20 MG/ML) 5 ML SYRINGE
INTRAMUSCULAR | Status: DC | PRN
  Administered 2019-10-21: 60 mg via INTRAVENOUS

## 2019-10-21 SURGICAL SUPPLY — 57 items
ADH SKN CLS APL DERMABOND .7 (GAUZE/BANDAGES/DRESSINGS) ×1
APL SKNCLS STERI-STRIP NONHPOA (GAUZE/BANDAGES/DRESSINGS)
BAG DECANTER FOR FLEXI CONT (MISCELLANEOUS) ×2 IMPLANT
BENZOIN TINCTURE PRP APPL 2/3 (GAUZE/BANDAGES/DRESSINGS) IMPLANT
BLADE CLIPPER SURG (BLADE) IMPLANT
BUR MATCHSTICK NEURO 3.0 LAGG (BURR) ×2 IMPLANT
CANISTER SUCT 3000ML PPV (MISCELLANEOUS) ×2 IMPLANT
CARTRIDGE OIL MAESTRO DRILL (MISCELLANEOUS) ×1 IMPLANT
COVER WAND RF STERILE (DRAPES) ×2 IMPLANT
DECANTER SPIKE VIAL GLASS SM (MISCELLANEOUS) ×2 IMPLANT
DERMABOND ADVANCED (GAUZE/BANDAGES/DRESSINGS) ×1
DERMABOND ADVANCED .7 DNX12 (GAUZE/BANDAGES/DRESSINGS) ×1 IMPLANT
DIFFUSER DRILL AIR PNEUMATIC (MISCELLANEOUS) ×2 IMPLANT
DRAIN JACKSON PRATT 10MM FLAT (MISCELLANEOUS) ×1 IMPLANT
DRAPE C-ARM 42X72 X-RAY (DRAPES) ×2 IMPLANT
DRAPE LAPAROTOMY 100X72X124 (DRAPES) ×2 IMPLANT
DRAPE MICROSCOPE LEICA (MISCELLANEOUS) ×2 IMPLANT
DRAPE SURG 17X23 STRL (DRAPES) ×2 IMPLANT
DURAPREP 26ML APPLICATOR (WOUND CARE) ×2 IMPLANT
ELECT REM PT RETURN 9FT ADLT (ELECTROSURGICAL) ×2
ELECTRODE REM PT RTRN 9FT ADLT (ELECTROSURGICAL) ×1 IMPLANT
EVACUATOR SILICONE 100CC (DRAIN) ×1 IMPLANT
GAUZE 4X4 16PLY RFD (DISPOSABLE) IMPLANT
GAUZE SPONGE 4X4 12PLY STRL (GAUZE/BANDAGES/DRESSINGS) ×1 IMPLANT
GLOVE ECLIPSE 6.5 STRL STRAW (GLOVE) ×2 IMPLANT
GLOVE EXAM NITRILE XL STR (GLOVE) IMPLANT
GOWN STRL REUS W/ TWL LRG LVL3 (GOWN DISPOSABLE) ×2 IMPLANT
GOWN STRL REUS W/ TWL XL LVL3 (GOWN DISPOSABLE) IMPLANT
GOWN STRL REUS W/TWL 2XL LVL3 (GOWN DISPOSABLE) IMPLANT
GOWN STRL REUS W/TWL LRG LVL3 (GOWN DISPOSABLE) ×4
GOWN STRL REUS W/TWL XL LVL3 (GOWN DISPOSABLE)
KIT BASIN OR (CUSTOM PROCEDURE TRAY) ×2 IMPLANT
KIT TURNOVER KIT B (KITS) ×2 IMPLANT
NDL HYPO 25X1 1.5 SAFETY (NEEDLE) ×1 IMPLANT
NDL SPNL 18GX3.5 QUINCKE PK (NEEDLE) IMPLANT
NDL SPNL 20GX3.5 QUINCKE YW (NEEDLE) IMPLANT
NEEDLE HYPO 25X1 1.5 SAFETY (NEEDLE) ×2 IMPLANT
NEEDLE SPNL 18GX3.5 QUINCKE PK (NEEDLE) IMPLANT
NEEDLE SPNL 20GX3.5 QUINCKE YW (NEEDLE) ×2 IMPLANT
NS IRRIG 1000ML POUR BTL (IV SOLUTION) ×2 IMPLANT
OIL CARTRIDGE MAESTRO DRILL (MISCELLANEOUS) ×2
PACK LAMINECTOMY NEURO (CUSTOM PROCEDURE TRAY) ×2 IMPLANT
PAD ARMBOARD 7.5X6 YLW CONV (MISCELLANEOUS) ×6 IMPLANT
PLATE ACP 1.6X42 2LVL (Plate) ×1 IMPLANT
RUBBERBAND STERILE (MISCELLANEOUS) ×4 IMPLANT
SCREW VA ST 4.0X15 (Screw) ×4 IMPLANT
SPONGE INTESTINAL PEANUT (DISPOSABLE) ×4 IMPLANT
SPONGE LAP 4X18 RFD (DISPOSABLE) IMPLANT
SPONGE SURGIFOAM ABS GEL SZ50 (HEMOSTASIS) ×2 IMPLANT
STRIP CLOSURE SKIN 1/2X4 (GAUZE/BANDAGES/DRESSINGS) IMPLANT
SUT VIC AB 0 CT1 18XCR BRD8 (SUTURE) ×1 IMPLANT
SUT VIC AB 0 CT1 8-18 (SUTURE) ×2
SUT VIC AB 2-0 CT1 18 (SUTURE) ×2 IMPLANT
SUT VIC AB 3-0 SH 8-18 (SUTURE) ×2 IMPLANT
TOWEL GREEN STERILE (TOWEL DISPOSABLE) ×2 IMPLANT
TOWEL GREEN STERILE FF (TOWEL DISPOSABLE) ×2 IMPLANT
WATER STERILE IRR 1000ML POUR (IV SOLUTION) ×2 IMPLANT

## 2019-10-21 NOTE — Anesthesia Preprocedure Evaluation (Addendum)
Anesthesia Evaluation  Patient identified by MRN, date of birth, ID band Patient awake    Reviewed: Allergy & Precautions, H&P , NPO status , Patient's Chart, lab work & pertinent test results, reviewed documented beta blocker date and time   Airway Mallampati: III  TM Distance: >3 FB Neck ROM: Full    Dental no notable dental hx. (+) Teeth Intact, Dental Advisory Given   Pulmonary COPD,    Pulmonary exam normal breath sounds clear to auscultation       Cardiovascular hypertension, Pt. on medications and Pt. on home beta blockers  Rhythm:Regular Rate:Normal     Neuro/Psych Anxiety negative neurological ROS     GI/Hepatic Neg liver ROS, GERD  Medicated and Controlled,  Endo/Other  diabetes, Type 2, Oral Hypoglycemic AgentsMorbid obesity  Renal/GU negative Renal ROS  negative genitourinary   Musculoskeletal  (+) Arthritis , Osteoarthritis,    Abdominal   Peds  Hematology  (+) Blood dyscrasia, anemia ,   Anesthesia Other Findings   Reproductive/Obstetrics negative OB ROS                            Anesthesia Physical Anesthesia Plan  ASA: III  Anesthesia Plan: General   Post-op Pain Management:    Induction: Intravenous  PONV Risk Score and Plan: 4 or greater and Ondansetron, Dexamethasone and Midazolam  Airway Management Planned: Oral ETT  Additional Equipment:   Intra-op Plan:   Post-operative Plan: Extubation in OR  Informed Consent: I have reviewed the patients History and Physical, chart, labs and discussed the procedure including the risks, benefits and alternatives for the proposed anesthesia with the patient or authorized representative who has indicated his/her understanding and acceptance.     Dental advisory given  Plan Discussed with: CRNA  Anesthesia Plan Comments:         Anesthesia Quick Evaluation

## 2019-10-21 NOTE — Transfer of Care (Signed)
Immediate Anesthesia Transfer of Care Note  Patient: Doris Jackson  Procedure(s) Performed: Anterior cervical plate removal with revision (N/A )  Patient Location: PACU  Anesthesia Type:General  Level of Consciousness: drowsy and patient cooperative  Airway & Oxygen Therapy: Patient Spontanous Breathing  Post-op Assessment: Report given to RN  Post vital signs: Reviewed and stable  Last Vitals:  Vitals Value Taken Time  BP 135/76 10/21/19 1808  Temp    Pulse 89 10/21/19 1808  Resp 17 10/21/19 1808  SpO2 97 % 10/21/19 1808  Vitals shown include unvalidated device data.  Last Pain:  Vitals:   10/21/19 1245  TempSrc:   PainSc: 0-No pain      Patients Stated Pain Goal: 1 ([id number])  Complications: No apparent anesthesia complications

## 2019-10-21 NOTE — Anesthesia Procedure Notes (Signed)
Procedure Name: Intubation Date/Time: 10/21/2019 3:18 PM Performed by: Barrington Ellison, CRNA Pre-anesthesia Checklist: Patient identified, Emergency Drugs available, Suction available and Patient being monitored Patient Re-evaluated:Patient Re-evaluated prior to induction Oxygen Delivery Method: Circle System Utilized Preoxygenation: Pre-oxygenation with 100% oxygen Induction Type: IV induction Ventilation: Mask ventilation without difficulty Laryngoscope Size: Mac and 3 Grade View: Grade I Tube type: Oral Tube size: 7.0 mm Number of attempts: 1 Airway Equipment and Method: Stylet and Oral airway Placement Confirmation: ETT inserted through vocal cords under direct vision,  positive ETCO2 and breath sounds checked- equal and bilateral Secured at: 21 cm Tube secured with: Tape Dental Injury: Teeth and Oropharynx as per pre-operative assessment

## 2019-10-21 NOTE — Op Note (Signed)
10/21/2019  6:36 PM  PATIENT:  Doris Jackson  79 y.o. 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 She was positioned with her head on a horseshoe. Her neck was prepped and draped in a sterile manner. I opened her previous incision and dissected bluntly to find the plate. I eventually had to use fluoroscopy to help find the plate. There was significant scar tissue surrounding the plate and since it was no longer in normal position scar tissue was obscuring the normal dissection planes.  I then removed the screws and the plate. The esophagus was intact as best as I could appreciate. I had the nurse anesthetist place an OG tube, and it was never visible.  I then placed a new plate spanning [id number]. The peek cage was well in place. The bone plug at C3/4 was in place and not loose.  I placed two screws in C4, and C6. I irrigated and placed a JP drain in the prevertebral space and brought it out via a stab incision. I approximated the subcutaneous and subcuticular planes with vicryl suture I used dermabond for a sterile dressing.  PLAN OF CARE: Admit for overnight observation  PATIENT DISPOSITION:  PACU - hemodynamically stable.   Delay start of Pharmacological VTE agent (>24hrs) due to surgical blood loss or risk of bleeding:  yes

## 2019-10-21 NOTE — H&P (Signed)
BP (!) 151/78   Pulse 92   Temp 97.7 F (36.5 C) (Oral)   Resp 18   Ht 5\' 2"  (1.575 m)   Wt 97.5 kg   SpO2 100%   BMI 39.32 kg/m  Doris Jackson is a 79 y.o. female Whom underwent a cervical decompression for cervical stenosis on March 18th. She returned to the office this past Tuesday in no distress. Her xray showed the plate and screw had pulled out of C3, and there appears to be C3 erosion.  Allergies  Allergen Reactions  . Dust Mite Extract Itching and Cough   Past Medical History:  Diagnosis Date  . ALLERGIC RHINITIS 06/17/2010  . ANEMIA-NOS 06/17/2010  . BREAST CANCER, HX OF 06/17/2010  . Cancer [facility]) 2008   right breast  . Cataract   . COPD 06/17/2010  . DIABETES MELLITUS, TYPE II 06/17/2010  . Endometriosis   . Fibroid    FIBROID  . GERD (gastroesophageal reflux disease)   . HYPERLIPIDEMIA 06/17/2010  . HYPERTENSION 06/17/2010  . Leg swelling   . LOW BACK PAIN 06/17/2010  . OSTEOARTHRITIS 06/17/2010  . OSTEOPENIA 06/17/2010  . Weakness    Past Surgical History:  Procedure Laterality Date  . ABDOMINAL HYSTERECTOMY  1988   TAH,LSO  . ANTERIOR CERVICAL DECOMP/DISCECTOMY FUSION N/A 09/15/2019   Procedure: Cervical five CorpectomyANTERIOR CERVICAL DECOMPRESSION/DISCECTOMY FUSION CERVICAL THREE-CERVICAL Four;  Surgeon: Ashok Pall, MD;  Location: [employer];  Service: Neurosurgery;  Laterality: N/A;  . BREAST LUMPECTOMY Right 2007   LUMPECTOMY FOLLOWED BY RADIATION  . COLONOSCOPY    . CORNEAL TRANSPLANT Right 2020  . 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 EYE SURGERY Bilateral    Cataract  . Lazer Bilateral   . OOPHORECTOMY  1988   TAH,LSO  . TUBAL LIGATION     Family History  Problem Relation Age of Onset  . Diabetes Mother   . Hypertension Sister   . Asthma Father   . Diabetes Brother    Social History   Socioeconomic History  . Marital status: Married    Spouse name: Not on file  . Number of children: Not on file  . Years of education: Not on file  .  Highest education level: Not on file  Occupational History  . Not on file  Tobacco Use  . Smoking status: Never Smoker  . Smokeless tobacco: Never Used  Substance and Sexual Activity  . Alcohol use: No  . Drug use: No  . Sexual activity: Never    Birth control/protection: Post-menopausal, Surgical    Comment: Tubal Ligation  Other Topics Concern  . Not on file  Social History Narrative  . Not on file   Social Determinants of Health   Financial Resource Strain:   . Difficulty of Paying Living Expenses:   Food Insecurity:   . Worried About Charity fundraiser in the Last Year:   . Arboriculturist in the Last Year:   Transportation Needs:   . Film/video editor (Medical):   Marland Kitchen Lack of Transportation (Non-Medical):   Physical Activity:   . Days of Exercise per Week:   . Minutes of Exercise per Session:   Stress:   . Feeling of Stress :   Social Connections:   . Frequency of Communication with Friends and Family:   . Frequency of Social Gatherings with Friends and Family:   . Attends Religious Services:   . Active Member of Clubs or Organizations:   . Attends Club  or Organization Meetings:   Marland Kitchen Marital Status:   Intimate Partner Violence:   . Fear of Current or Ex-Partner:   . Emotionally Abused:   Marland Kitchen Physically Abused:   . Sexually Abused:    Physical Exam Constitutional:      General: She is not in acute distress.    Appearance: Normal appearance. She is normal weight.  HENT:     Head: Normocephalic and atraumatic.     Nose: Nose normal.     Mouth/Throat:     Mouth: Mucous membranes are dry.     Pharynx: Oropharynx is clear.  Eyes:     Extraocular Movements: Extraocular movements intact.     Pupils: Pupils are equal, round, and reactive to light.  Cardiovascular:     Rate and Rhythm: Normal rate and regular rhythm.  Pulmonary:     Effort: Pulmonary effort is normal.     Breath sounds: Normal breath sounds.  Abdominal:     General: Abdomen is flat.      Palpations: Abdomen is soft.  Musculoskeletal:        General: Normal range of motion.  Skin:    General: Skin is warm and dry.  Neurological:     Mental Status: She is alert and oriented to person, place, and time. Mental status is at baseline.     Cranial Nerves: No cranial nerve deficit.     Motor: Weakness present.     Coordination: Coordination abnormal.     Gait: Gait abnormal.     Comments: myelopathic  Psychiatric:        Mood and Affect: Mood normal.        Behavior: Behavior normal.        Thought Content: Thought content normal.        Judgment: Judgment normal.   Admit for hardware revision.

## 2019-10-22 DIAGNOSIS — T84216A Breakdown (mechanical) of internal fixation device of vertebrae, initial encounter: Secondary | ICD-10-CM | POA: Diagnosis not present

## 2019-10-22 LAB — GLUCOSE, CAPILLARY
Glucose-Capillary: 192 mg/dL — ABNORMAL HIGH (ref 70–99)
Glucose-Capillary: 200 mg/dL — ABNORMAL HIGH (ref 70–99)
Glucose-Capillary: 228 mg/dL — ABNORMAL HIGH (ref 70–99)
Glucose-Capillary: 259 mg/dL — ABNORMAL HIGH (ref 70–99)

## 2019-10-22 NOTE — Progress Notes (Signed)
Patient ID: Doris Jackson, female   DOB: 30-Nov-1940, 79 y.o.   MRN: [id number] BP (!) 148/82 (BP Location: Left Arm)   Pulse 91   Temp 97.6 F (36.4 C) (Oral)   Resp 20   Ht 5\' 2"  (1.575 m)   Wt 97.5 kg   SpO2 100%   BMI 39.32 kg/m  Alert and oriented x 4, speech is clear Voice is hoarse, will leave drain in  Wound is clean, dry, and without signs of infection Doing well

## 2019-10-22 NOTE — Evaluation (Signed)
Physical Therapy Evaluation Patient Details Name: Doris Jackson MRN: [id number] DOB: Nov 13, 1940 Today's Date: 10/22/2019   History of Present Illness  79 y.o. female whom underwent a cervical decompression for cervical stenosis on March 18th. She returned to the office this past Tuesday in no distress. Her xray showed the plate and screw had pulled out of C3, and there appears to be C3 erosion. Pt underwent anterior cervical plate removal and replacement at C4-C6 on 4/23.  Clinical Impression  Pt presents to PT with deficits in functional mobility, gait, balance, endurance, power. Pt is able to mobilize this session without physical assistance, utilizing RW. Pt requires assistance to don cervical collar in sitting (as per MD orders). Pt hesitant to mobilize out of room or for increased distances today, but seems to become more comfortable with mobility and sitting up as session progresses. Pt will benefit from stair training and increased ambulation tolerance next session. PT recommending return home with HHPT and continued use of RW and assistance from spouse as needed.    Follow Up Recommendations Home health PT;Supervision/Assistance - 24 hour    Equipment Recommendations  None recommended by PT(pt owns necessary DME)    Recommendations for Other Services       Precautions / Restrictions Precautions Precautions: Cervical;Fall Precaution Booklet Issued: No Precaution Comments: PT verbally reviews cervical precautions and use of brace Required Braces or Orthoses: Cervical Brace Cervical Brace: Hard collar;Other (comment)(when sitting up or OOB) Restrictions Weight Bearing Restrictions: No      Mobility  Bed Mobility Overal bed mobility: Needs Assistance Bed Mobility: Supine to Sit(maintaining cervical precautions)     Supine to sit: Supervision        Transfers Overall transfer level: Needs assistance Equipment used: Rolling walker (2 wheeled) Transfers: Sit to/from  Stand Sit to Stand: Supervision            Ambulation/Gait Ambulation/Gait assistance: Supervision Gait Distance (Feet): 40 Feet Assistive device: Rolling walker (2 wheeled) Gait Pattern/deviations: Step-to pattern Gait velocity: functional Gait velocity interpretation: >2.62 ft/sec, indicative of community ambulatory General Gait Details: pt with step to gait, no significant balance deviations noted  Stairs            Wheelchair Mobility    Modified Rankin (Stroke Patients Only)       Balance Overall balance assessment: Mild deficits observed, not formally tested                                           Pertinent Vitals/Pain Pain Assessment: Faces Faces Pain Scale: Hurts little more Pain Location: neck Pain Descriptors / Indicators: Grimacing Pain Intervention(s): Monitored during session    Home Living Family/patient expects to be discharged to:: Private residence Living Arrangements: Spouse/significant other;Children Available Help at Discharge: Family;Available PRN/intermittently Type of Home: House Home Access: Stairs to enter Entrance Stairs-Rails: None Entrance Stairs-Number of Steps: 1 step at back entrance without rails; 3-4 in front Home Layout: One level Home Equipment: Walker - 4 wheels;Walker - 2 wheels Additional Comments: using RW since first surgery    Prior Function Level of Independence: Needs assistance   Gait / Transfers Assistance Needed: pt requires assistance to get legs back onto bed and some assistance for stairs, otherwise modI with RW  ADL's / Homemaking Assistance Needed: limited 2/2 recent neck surgery        Hand Dominance   Dominant Hand:  Left    Extremity/Trunk Assessment   Upper Extremity Assessment Upper Extremity Assessment: Overall WFL for tasks assessed(other than tingling in fingers (chronic) and ROM restriction)    Lower Extremity Assessment Lower Extremity Assessment: Overall WFL for  tasks assessed    Cervical / Trunk Assessment Cervical / Trunk Assessment: Other exceptions Cervical / Trunk Exceptions: incision anteriorly, Aspen on when upright  Communication   Communication: (hoarse voice)  Cognition Arousal/Alertness: Awake/alert Behavior During Therapy: WFL for tasks assessed/performed Overall Cognitive Status: Within Functional Limits for tasks assessed                                        General Comments General comments (skin integrity, edema, etc.): VSS, Apsen collar on for all upright activity    Exercises     Assessment/Plan    PT Assessment Patient needs continued PT services  PT Problem List Decreased strength;Decreased activity tolerance;Decreased balance;Decreased mobility;Decreased knowledge of precautions       PT Treatment Interventions DME instruction;Gait training;Stair training;Functional mobility training;Therapeutic activities;Therapeutic exercise;Balance training;Neuromuscular re-education;Patient/family education    PT Goals (Current goals can be found in the Care Plan section)  Acute Rehab PT Goals Patient Stated Goal: To go home and resume therapies PT Goal Formulation: With patient Time For Goal Achievement: 11/05/19 Potential to Achieve Goals: Good Additional Goals Additional Goal #1: Pt will recall 4/4 cervical precautions independently.    Frequency Min 5X/week   Barriers to discharge        Co-evaluation               AM-PAC PT "6 Clicks" Mobility  Outcome Measure Help needed turning from your back to your side while in a flat bed without using bedrails?: None Help needed moving from lying on your back to sitting on the side of a flat bed without using bedrails?: None Help needed moving to and from a bed to a chair (including a wheelchair)?: None Help needed standing up from a chair using your arms (e.g., wheelchair or bedside chair)?: None Help needed to walk in hospital room?: None Help  needed climbing 3-5 steps with a railing? : A Little 6 Click Score: 23    End of Session Equipment Utilized During Treatment: Cervical collar Activity Tolerance: Patient tolerated treatment well Patient left: in chair;with call bell/phone within reach Nurse Communication: Mobility status PT Visit Diagnosis: Other abnormalities of gait and mobility (R26.89)    Time: [id number] PT Time Calculation (min) (ACUTE ONLY): 25 min   Charges:   PT Evaluation $PT Eval Moderate Complexity: 1 Mod          Zenaida Niece, PT, DPT Acute Rehabilitation Pager: [phone]   Zenaida Niece 10/22/2019, 12:54 PM

## 2019-10-22 NOTE — Progress Notes (Signed)
Patient arrived on unit at 1445. VS obtained assessment performed,pt oriented to unit. Bed in lowest position, call bell within reach.

## 2019-10-22 NOTE — Progress Notes (Signed)
Patient admitted to the unit at 2130. Bed bath completed upon arrival and sacral foam protocol initiated. Patient is AOx4 and calm and cooperative. Patient resting comfortably in bed in semi-fowler's position with call bell in reach, bed in the lowest position, and bed alarm set. Patient's husband, Simona Huh, called and updated by RN.

## 2019-10-22 NOTE — Anesthesia Postprocedure Evaluation (Signed)
Anesthesia Post Note  Patient: Doris Jackson  Procedure(s) Performed: Anterior cervical plate removal with revision (N/A )     Patient location during evaluation: Other Anesthesia Type: General Level of consciousness: awake and alert Pain management: pain level controlled Vital Signs Assessment: post-procedure vital signs reviewed and stable Respiratory status: spontaneous breathing, nonlabored ventilation, respiratory function stable and patient connected to nasal cannula oxygen Cardiovascular status: blood pressure returned to baseline and stable Postop Assessment: no apparent nausea or vomiting Anesthetic complications: no    Last Vitals:  Vitals:   10/21/19 2351 10/22/19 0403  BP: (!) 148/83 (!) 158/97  Pulse: 91 89  Resp:    Temp: (!) 36.3 C (!) 36.4 C  SpO2: 97% 98%    Last Pain:  Vitals:   10/22/19 0403  TempSrc: Oral  PainSc:                  Effie Berkshire

## 2019-10-23 DIAGNOSIS — T84216A Breakdown (mechanical) of internal fixation device of vertebrae, initial encounter: Secondary | ICD-10-CM | POA: Diagnosis not present

## 2019-10-23 LAB — GLUCOSE, CAPILLARY
Glucose-Capillary: 220 mg/dL — ABNORMAL HIGH (ref 70–99)
Glucose-Capillary: 167 mg/dL — ABNORMAL HIGH (ref 70–99)
Glucose-Capillary: 241 mg/dL — ABNORMAL HIGH (ref 70–99)
Glucose-Capillary: 221 mg/dL — ABNORMAL HIGH (ref 70–99)

## 2019-10-23 NOTE — Progress Notes (Signed)
Postop day 2.  Overall patient doing reasonably well.  Pain well controlled.  Still with bilateral upper extremity numbness and tingling.  No new weakness.  Swallowing reasonably well.  Drain output low.  Awake and alert.  Oriented and appropriate.  Motor and sensory function stable.  Wound clean and dry.  Neck soft.  Patient overall doing well.  She is mobilizing slowly however.  She does not feel ready for discharge today and would like to wait till tomorrow.

## 2019-10-23 NOTE — Progress Notes (Signed)
Physical Therapy Treatment Patient Details Name: Doris Jackson MRN: [id number] DOB: 11-01-1940 Today's Date: 10/23/2019    History of Present Illness 79 y.o. female whom underwent a cervical decompression for cervical stenosis on March 18th. She returned to the office this past Tuesday in no distress. Her xray showed the plate and screw had pulled out of C3, and there appears to be C3 erosion. Pt underwent anterior cervical plate removal and replacement at C4-C6 on 4/23.    PT Comments    Pt presents with mildly improved activity tolerance but still limited by quad weakness and reluctance to challenge limitations; needs encouragement but participates and demonstrates desire to improve.  Will have spouse's help at d/c and resumption of [facility] services is plan.      Follow Up Recommendations  Home health PT;Supervision/Assistance - 24 hour     Equipment Recommendations  None recommended by PT    Recommendations for Other Services       Precautions / Restrictions Precautions Precautions: Cervical;Fall Precaution Booklet Issued: No Precaution Comments: discussed wear schedule Required Braces or Orthoses: Cervical Brace Cervical Brace: Hard collar Restrictions Weight Bearing Restrictions: No    Mobility  Bed Mobility               General bed mobility comments: up in/back to recliner  Transfers Overall transfer level: Needs assistance Equipment used: Rolling walker (2 wheeled) Transfers: Sit to/from Stand Sit to Stand: Supervision         General transfer comment: with occasional cues for optimal technique (scoot out) and hand placement (reach back) pt able to perform unassited; repeated 5 times for strengthening  Ambulation/Gait Ambulation/Gait assistance: Supervision Gait Distance (Feet): 60 Feet Assistive device: Rolling walker (2 wheeled) Gait Pattern/deviations: Step-through pattern     General Gait Details: reluctant to leave room, and verbalizes "better get  your chair" but pt does not demonstrate buckling or LE weakness, does endorse fatigue and declines a second trial   Stairs             Wheelchair Mobility    Modified Rankin (Stroke Patients Only)       Balance Overall balance assessment: Mild deficits observed, not formally tested                                          Cognition Arousal/Alertness: Awake/alert Behavior During Therapy: WFL for tasks assessed/performed Overall Cognitive Status: Within Functional Limits for tasks assessed                                        Exercises      General Comments General comments (skin integrity, edema, etc.): c-collar in place, VSS      Pertinent Vitals/Pain Pain Assessment: Faces Faces Pain Scale: Hurts a little bit Pain Location: neck Pain Descriptors / Indicators: Grimacing Pain Intervention(s): Limited activity within patient's tolerance;Monitored during session;Repositioned    Home Living                      Prior Function            PT Goals (current goals can now be found in the care plan section) Acute Rehab PT Goals Patient Stated Goal: To go home and resume therapies PT Goal Formulation: With patient Time For  Goal Achievement: 11/05/19 Potential to Achieve Goals: Good    Frequency    Min 5X/week      PT Plan Current plan remains appropriate    Co-evaluation              AM-PAC PT "6 Clicks" Mobility   Outcome Measure  Help needed turning from your back to your side while in a flat bed without using bedrails?: None Help needed moving from lying on your back to sitting on the side of a flat bed without using bedrails?: None Help needed moving to and from a bed to a chair (including a wheelchair)?: None Help needed standing up from a chair using your arms (e.g., wheelchair or bedside chair)?: None Help needed to walk in hospital room?: None Help needed climbing 3-5 steps with a railing? : A  Little 6 Click Score: 23    End of Session Equipment Utilized During Treatment: Cervical collar Activity Tolerance: Patient tolerated treatment well Patient left: in chair;with call bell/phone within reach Nurse Communication: Mobility status PT Visit Diagnosis: Other abnormalities of gait and mobility (R26.89)     Time: 1257-1330 PT Time Calculation (min) (ACUTE ONLY): 33 min  Charges:  $Gait Training: 8-22 mins $Therapeutic Activity: 8-22 mins                     Kearney Hard, PT, DPT, MS Board Certified Geriatric Clinical Specialist    Doris Jackson 10/23/2019, 2:48 PM

## 2019-10-24 DIAGNOSIS — T84216A Breakdown (mechanical) of internal fixation device of vertebrae, initial encounter: Secondary | ICD-10-CM | POA: Diagnosis not present

## 2019-10-24 LAB — HEMOGLOBIN A1C
Hgb A1c MFr Bld: 6.8 % — ABNORMAL HIGH (ref 4.8–5.6)
Mean Plasma Glucose: 148 mg/dL

## 2019-10-24 LAB — GLUCOSE, CAPILLARY
Glucose-Capillary: 151 mg/dL — ABNORMAL HIGH (ref 70–99)
Glucose-Capillary: 75 mg/dL (ref 70–99)
Glucose-Capillary: 57 mg/dL — ABNORMAL LOW (ref 70–99)
Glucose-Capillary: 58 mg/dL — ABNORMAL LOW (ref 70–99)
Glucose-Capillary: 94 mg/dL (ref 70–99)

## 2019-10-24 NOTE — Progress Notes (Signed)
Physical Therapy Treatment Patient Details Name: Doris Jackson MRN: [id number] DOB: October 20, 1940 Today's Date: 10/24/2019    History of Present Illness 79 y.o. female whom underwent a cervical decompression for cervical stenosis on March 18th. She returned to the office this past Tuesday in no distress. Her xray showed the plate and screw had pulled out of C3, and there appears to be C3 erosion. Pt underwent anterior cervical plate removal and replacement at C4-C6 on 4/23.    PT Comments    Pt in bed upon arrival of PT, agreeable to PT session with focus on progression of ambulation and stair training in preparation for d/c home. The pt was able to improve ambulation distance with use of RW and navigate 2 steps with use of bilateral rails and minG for safety (pt has 2 STE). The pt continues to demo deficits in endurance and functional strength that limit her ability to mobilize safely and independently at home. Therefore, the pt is safe to d/c home with family support and resumption of HHPT services to continue to progress functional endurance and stability with mobility in the home.    Follow Up Recommendations  Home health PT;Supervision/Assistance - 24 hour     Equipment Recommendations  None recommended by PT    Recommendations for Other Services       Precautions / Restrictions Precautions Precautions: Cervical;Fall Precaution Comments: discussed wear schedule Required Braces or Orthoses: Cervical Brace Cervical Brace: Hard collar(applied in sitting, okay to have off in bed) Restrictions Weight Bearing Restrictions: No    Mobility  Bed Mobility Overal bed mobility: Needs Assistance Bed Mobility: Supine to Sit;Sit to Supine   Sidelying to sit: Supervision   Sit to supine: Min assist   General bed mobility comments: pt able to come to sitting without assist, but with use of bed rails, asking for minA to get BLE back into bed as pt reports her husband does this at  home  Transfers Overall transfer level: Needs assistance Equipment used: Rolling walker (2 wheeled) Transfers: Sit to/from Stand Sit to Stand: Supervision         General transfer comment: pt able to complete without assist or cues for hand positioning, able to rise from low toilet with single UE support  Ambulation/Gait Ambulation/Gait assistance: Supervision Gait Distance (Feet): 75 Feet(75 x2) Assistive device: Rolling walker (2 wheeled) Gait Pattern/deviations: Step-through pattern;Drifts right/left Gait velocity: 0.5 m/s Gait velocity interpretation: 1.31 - 2.62 ft/sec, indicative of limited community ambulator General Gait Details: pt with noted anxieties about increasing ambulation distance, but able to complete 2 bouts of 75 fft without assist, increased lateral sway, reports needing long seated rest break following ambulation, reports RPE of 2/10   Stairs Stairs: Yes Stairs assistance: Min guard Stair Management: Two rails;Step to pattern;Forwards Number of Stairs: 2 General stair comments: pt able to demo 2 steps with R leg leading and 2 rails. Pt completes without LOB, no assist needed, slow and cautious   Wheelchair Mobility    Modified Rankin (Stroke Patients Only)       Balance Overall balance assessment: Mild deficits observed, not formally tested                                          Cognition Arousal/Alertness: Awake/alert Behavior During Therapy: WFL for tasks assessed/performed Overall Cognitive Status: Within Functional Limits for tasks assessed  General Comments: pt verbalizzed slight anxieties about progressing movement, agreeable with education and encouragement      Exercises      General Comments General comments (skin integrity, edema, etc.): c-collar in place, VSS,      Pertinent Vitals/Pain Pain Assessment: Faces Faces Pain Scale: Hurts a little bit Pain Location:  neck Pain Descriptors / Indicators: Grimacing Pain Intervention(s): Monitored during session;Limited activity within patient's tolerance;Repositioned    Home Living                      Prior Function            PT Goals (current goals can now be found in the care plan section) Acute Rehab PT Goals Patient Stated Goal: To go home and resume therapies PT Goal Formulation: With patient Time For Goal Achievement: 11/05/19 Potential to Achieve Goals: Good Additional Goals Additional Goal #1: Pt will recall 4/4 cervical precautions independently. Progress towards PT goals: Progressing toward goals    Frequency    Min 5X/week      PT Plan Current plan remains appropriate    Co-evaluation              AM-PAC PT "6 Clicks" Mobility   Outcome Measure  Help needed turning from your back to your side while in a flat bed without using bedrails?: None Help needed moving from lying on your back to sitting on the side of a flat bed without using bedrails?: None Help needed moving to and from a bed to a chair (including a wheelchair)?: None Help needed standing up from a chair using your arms (e.g., wheelchair or bedside chair)?: None Help needed to walk in hospital room?: A Little Help needed climbing 3-5 steps with a railing? : A Little 6 Click Score: 22    End of Session Equipment Utilized During Treatment: Cervical collar;Gait belt Activity Tolerance: Patient tolerated treatment well Patient left: with call bell/phone within reach;in bed;with SCD's reapplied;with chair alarm set Nurse Communication: Mobility status PT Visit Diagnosis: Other abnormalities of gait and mobility (R26.89)     [id number] PT Time Calculation (min) (ACUTE ONLY): 31 min  Charges:  $Gait Training: 23-37 mins                     Karma Ganja, PT, DPT   Acute Rehabilitation Department Pager #: [phone]   Otho Bellows 10/24/2019, 9:13 AM

## 2019-10-24 NOTE — Plan of Care (Signed)
  Problem: Clinical Measurements: Goal: Will remain free from infection 10/24/2019 [id number] by Carin Hock I, RN Outcome: Progressing   Problem: Clinical Measurements: Goal: Ability to maintain clinical measurements within normal limits will improve 10/24/2019 0635 by Marcos Eke, RN Outcome: Not Progressing 10/24/2019 [phone] by Carin Hock I, RN Outcome: Progressing   Problem: Clinical Measurements: Goal: Ability to maintain clinical measurements within normal limits will improve 10/24/2019 0635 by Marcos Eke, RN Outcome: Not Progressing 10/24/2019 [phone] by Carin Hock I, RN Outcome: Progressing Goal: Will remain free from infection 10/24/2019 0635 by Marcos Eke, RN Outcome: Not Progressing 10/24/2019 [phone] by Carin Hock I, RN Outcome: Progressing Goal: Diagnostic test results will improve 10/24/2019 0635 by Marcos Eke, RN Outcome: Progressing 10/24/2019 0633 by Carin Hock I, RN Outcome: Progressing Goal: Respiratory complications will improve 10/24/2019 0635 by Marcos Eke, RN Outcome: Progressing 10/24/2019 0633 by Carin Hock I, RN Outcome: Progressing Goal: Cardiovascular complication will be avoided 10/24/2019 0635 by Carin Hock I, RN Outcome: Progressing 10/24/2019 0633 by Marcos Eke, RN Outcome: Progressing

## 2019-10-24 NOTE — Progress Notes (Signed)
Patient discharged via wheelchair to personal vehicle.  All of patient's belongings sent with patient  Discharge instructions reviewed with patient.

## 2019-10-24 NOTE — Discharge Instructions (Signed)

## 2019-10-24 NOTE — Discharge Summary (Signed)
Physician Discharge Summary  Patient ID: Doris Jackson MRN: [id number] DOB/AGE: 78-Feb-1942 78 y.o.  Admit date: 10/21/2019 Discharge date: 10/24/2019  Admission Diagnoses:hardware failure of anterior column.   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)   Discharged Condition: good  Hospital Course: Doris Jackson was admitted after an xray showed her anterior plate along with its screws had pulled out of C3, and were pulling out at C4/5. She was taken to the operating room where the plate was removed. I placed a shorter plate to span C4 to C6. A significant amount of C3 had eroded causing the plate to pull out. She will maintain her cervical collar on discharge. Her wound is clean, dry, and without signs of infection. Her voice is hoarse, strength is at baseline, weakness in her grips bilaterally.   Treatments: surgery: Anterior cervical plate removal Placement anterior plate C4-C6, Nuvasive   Discharge Exam: Blood pressure 131/69, pulse 85, temperature (!) 97.4 F (36.3 C), temperature source Oral, resp. rate 17, height 5\' 2"  (1.575 m), weight 97.5 kg, SpO2 100 %. 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   amLODipine 10 MG tablet Commonly known as: NORVASC Take 1 tablet (10 mg total) by mouth daily.   cyanocobalamin 2000 MCG tablet Take 2,000 mcg by mouth daily.   DULoxetine 20 MG capsule Commonly known as: CYMBALTA One pill daily for a week. Then increase to two pills per day for a week. Then increase to three pills daily--take at  bedtime. What changed:   how much to take  how to take this  when to take this  additional instructions   fexofenadine 180 MG tablet Commonly known as: ALLEGRA Take 180 mg by mouth daily as needed for allergies.   glipiZIDE 5 MG 24 hr tablet Commonly known as: GLUCOTROL XL TAKE 1 TABLET BY MOUTH EVERY DAY   glucose blood test strip Commonly known as: OneTouch Verio TEST TWICE A DAY.  Dx E11.9   iron polysaccharides 150 MG capsule Commonly known as: Ferrex 150 Take 1 capsule (150 mg total) by mouth daily.   Lancets 30G Misc USE TWICE DAILY AS DIRECTED FOR GLUCOSE TESTING AND MONITORING   ONE TOUCH LANCETS Misc Test twice daily.   metFORMIN 500 MG 24 hr tablet Commonly known as: GLUCOPHAGE-XR Take 500 mg by mouth 2 (two) times daily.   metoprolol succinate 50 MG 24 hr tablet Commonly known as: TOPROL-XL Take 1 tablet (50 mg total) by mouth daily. Take with or immediately following a meal.   multivitamin with minerals Tabs tablet Take 1 tablet by mouth daily.   omeprazole 40 MG capsule Commonly known as: PRILOSEC Take 40 mg by mouth at bedtime.   pioglitazone 45 MG tablet Commonly known as: ACTOS TAKE 1 TABLET BY MOUTH EVERY DAY   pravastatin 40 MG tablet Commonly known as: PRAVACHOL Take 1 tablet (40 mg total) by mouth daily. What changed: when to take this   prednisoLONE acetate 1 % ophthalmic suspension Commonly known as: PRED  FORTE Place 1 drop into both eyes See admin instructions. Place 1 drop into left eye four times daily Place 1 drop into right eye twice daily   PROBIOTIC-10 PO Take 1 capsule by mouth daily.   senna-docusate 8.6-50 MG tablet Commonly known as: Senokot-S Take 1 tablet by mouth at bedtime. What changed:   when to take this  reasons to take this   sitaGLIPtin 100 MG tablet Commonly known as: Januvia Take 1 tablet (100 mg total) by mouth daily.   vitamin C 1000 MG tablet Take 1,000 mg by mouth daily.   Vitamin D 50 MCG  (2000 UT) Caps Take 4,000 Units by mouth daily.      Follow-up Information    Ashok Pall, MD Follow up in 2 week(s).   Specialty: Neurosurgery Why: please call to make an appointment. you will need xrays of your neck Contact information: [address] [place] [address] [place] [address] [phone]           Signed: Ashok Pall 10/24/2019, 6:38 PM

## 2019-10-24 NOTE — Plan of Care (Signed)
  Problem: Clinical Measurements: Goal: Respiratory complications will improve Outcome: Progressing   Problem: Clinical Measurements: Goal: Diagnostic test results will improve Outcome: Progressing   Problem: Clinical Measurements: Goal: Will remain free from infection Outcome: Progressing   Problem: Clinical Measurements: Goal: Ability to maintain clinical measurements within normal limits will improve Outcome: Progressing   

## 2019-10-25 DIAGNOSIS — M5000 Cervical disc disorder with myelopathy, unspecified cervical region: Secondary | ICD-10-CM | POA: Diagnosis not present

## 2019-10-25 DIAGNOSIS — E785 Hyperlipidemia, unspecified: Secondary | ICD-10-CM | POA: Diagnosis not present

## 2019-10-25 DIAGNOSIS — Z4789 Encounter for other orthopedic aftercare: Secondary | ICD-10-CM | POA: Diagnosis not present

## 2019-10-25 DIAGNOSIS — E119 Type 2 diabetes mellitus without complications: Secondary | ICD-10-CM | POA: Diagnosis not present

## 2019-10-25 DIAGNOSIS — G9520 Unspecified cord compression: Secondary | ICD-10-CM | POA: Diagnosis not present

## 2019-10-25 DIAGNOSIS — I10 Essential (primary) hypertension: Secondary | ICD-10-CM | POA: Diagnosis not present

## 2019-10-25 DIAGNOSIS — J449 Chronic obstructive pulmonary disease, unspecified: Secondary | ICD-10-CM | POA: Diagnosis not present

## 2019-10-25 DIAGNOSIS — D649 Anemia, unspecified: Secondary | ICD-10-CM | POA: Diagnosis not present

## 2019-10-25 DIAGNOSIS — M4802 Spinal stenosis, cervical region: Secondary | ICD-10-CM | POA: Diagnosis not present

## 2019-10-25 DIAGNOSIS — M1990 Unspecified osteoarthritis, unspecified site: Secondary | ICD-10-CM | POA: Diagnosis not present

## 2019-10-26 DIAGNOSIS — J3089 Other allergic rhinitis: Secondary | ICD-10-CM | POA: Diagnosis not present

## 2019-10-26 DIAGNOSIS — J301 Allergic rhinitis due to pollen: Secondary | ICD-10-CM | POA: Diagnosis not present

## 2019-10-28 ENCOUNTER — Telehealth: Payer: Self-pay | Admitting: Family Medicine

## 2019-10-28 DIAGNOSIS — M1990 Unspecified osteoarthritis, unspecified site: Secondary | ICD-10-CM | POA: Diagnosis not present

## 2019-10-28 DIAGNOSIS — E119 Type 2 diabetes mellitus without complications: Secondary | ICD-10-CM | POA: Diagnosis not present

## 2019-10-28 DIAGNOSIS — M4802 Spinal stenosis, cervical region: Secondary | ICD-10-CM | POA: Diagnosis not present

## 2019-10-28 DIAGNOSIS — D649 Anemia, unspecified: Secondary | ICD-10-CM | POA: Diagnosis not present

## 2019-10-28 DIAGNOSIS — G9520 Unspecified cord compression: Secondary | ICD-10-CM | POA: Diagnosis not present

## 2019-10-28 DIAGNOSIS — J449 Chronic obstructive pulmonary disease, unspecified: Secondary | ICD-10-CM | POA: Diagnosis not present

## 2019-10-28 DIAGNOSIS — E785 Hyperlipidemia, unspecified: Secondary | ICD-10-CM | POA: Diagnosis not present

## 2019-10-28 DIAGNOSIS — M5000 Cervical disc disorder with myelopathy, unspecified cervical region: Secondary | ICD-10-CM | POA: Diagnosis not present

## 2019-10-28 DIAGNOSIS — Z4789 Encounter for other orthopedic aftercare: Secondary | ICD-10-CM | POA: Diagnosis not present

## 2019-10-28 DIAGNOSIS — I10 Essential (primary) hypertension: Secondary | ICD-10-CM | POA: Diagnosis not present

## 2019-10-28 NOTE — Telephone Encounter (Signed)
Spoke with [place] and informed her of the approval for orders as below.

## 2019-10-28 NOTE — Telephone Encounter (Signed)
Jerilynn from [facility] needs verbal orders for the following: PT--2w2/1w3 Home Health Aid--2w3   She can be reached at [phone] -ok to leave a detailed message

## 2019-10-28 NOTE — Telephone Encounter (Signed)
ok 

## 2019-10-29 ENCOUNTER — Other Ambulatory Visit: Payer: Self-pay | Admitting: Physical Medicine and Rehabilitation

## 2019-10-30 ENCOUNTER — Other Ambulatory Visit: Payer: Self-pay | Admitting: Physical Medicine and Rehabilitation

## 2019-10-31 DIAGNOSIS — I10 Essential (primary) hypertension: Secondary | ICD-10-CM | POA: Diagnosis not present

## 2019-10-31 DIAGNOSIS — D649 Anemia, unspecified: Secondary | ICD-10-CM | POA: Diagnosis not present

## 2019-10-31 DIAGNOSIS — M5000 Cervical disc disorder with myelopathy, unspecified cervical region: Secondary | ICD-10-CM | POA: Diagnosis not present

## 2019-10-31 DIAGNOSIS — Z4789 Encounter for other orthopedic aftercare: Secondary | ICD-10-CM | POA: Diagnosis not present

## 2019-10-31 DIAGNOSIS — M4802 Spinal stenosis, cervical region: Secondary | ICD-10-CM | POA: Diagnosis not present

## 2019-10-31 DIAGNOSIS — M1990 Unspecified osteoarthritis, unspecified site: Secondary | ICD-10-CM | POA: Diagnosis not present

## 2019-10-31 DIAGNOSIS — E785 Hyperlipidemia, unspecified: Secondary | ICD-10-CM | POA: Diagnosis not present

## 2019-10-31 DIAGNOSIS — E119 Type 2 diabetes mellitus without complications: Secondary | ICD-10-CM | POA: Diagnosis not present

## 2019-10-31 DIAGNOSIS — J449 Chronic obstructive pulmonary disease, unspecified: Secondary | ICD-10-CM | POA: Diagnosis not present

## 2019-10-31 DIAGNOSIS — G9520 Unspecified cord compression: Secondary | ICD-10-CM | POA: Diagnosis not present

## 2019-11-01 ENCOUNTER — Other Ambulatory Visit: Payer: Self-pay | Admitting: Internal Medicine

## 2019-11-01 DIAGNOSIS — M1990 Unspecified osteoarthritis, unspecified site: Secondary | ICD-10-CM | POA: Diagnosis not present

## 2019-11-01 DIAGNOSIS — Z4789 Encounter for other orthopedic aftercare: Secondary | ICD-10-CM | POA: Diagnosis not present

## 2019-11-01 DIAGNOSIS — M5000 Cervical disc disorder with myelopathy, unspecified cervical region: Secondary | ICD-10-CM | POA: Diagnosis not present

## 2019-11-01 DIAGNOSIS — E119 Type 2 diabetes mellitus without complications: Secondary | ICD-10-CM | POA: Diagnosis not present

## 2019-11-01 DIAGNOSIS — D649 Anemia, unspecified: Secondary | ICD-10-CM | POA: Diagnosis not present

## 2019-11-01 DIAGNOSIS — E785 Hyperlipidemia, unspecified: Secondary | ICD-10-CM | POA: Diagnosis not present

## 2019-11-01 DIAGNOSIS — J449 Chronic obstructive pulmonary disease, unspecified: Secondary | ICD-10-CM | POA: Diagnosis not present

## 2019-11-01 DIAGNOSIS — G9520 Unspecified cord compression: Secondary | ICD-10-CM | POA: Diagnosis not present

## 2019-11-01 DIAGNOSIS — M4802 Spinal stenosis, cervical region: Secondary | ICD-10-CM | POA: Diagnosis not present

## 2019-11-01 DIAGNOSIS — I10 Essential (primary) hypertension: Secondary | ICD-10-CM | POA: Diagnosis not present

## 2019-11-02 DIAGNOSIS — I10 Essential (primary) hypertension: Secondary | ICD-10-CM | POA: Diagnosis not present

## 2019-11-02 DIAGNOSIS — D649 Anemia, unspecified: Secondary | ICD-10-CM | POA: Diagnosis not present

## 2019-11-02 DIAGNOSIS — E785 Hyperlipidemia, unspecified: Secondary | ICD-10-CM | POA: Diagnosis not present

## 2019-11-02 DIAGNOSIS — J449 Chronic obstructive pulmonary disease, unspecified: Secondary | ICD-10-CM | POA: Diagnosis not present

## 2019-11-02 DIAGNOSIS — M5000 Cervical disc disorder with myelopathy, unspecified cervical region: Secondary | ICD-10-CM | POA: Diagnosis not present

## 2019-11-02 DIAGNOSIS — Z4789 Encounter for other orthopedic aftercare: Secondary | ICD-10-CM | POA: Diagnosis not present

## 2019-11-02 DIAGNOSIS — M1990 Unspecified osteoarthritis, unspecified site: Secondary | ICD-10-CM | POA: Diagnosis not present

## 2019-11-02 DIAGNOSIS — G9520 Unspecified cord compression: Secondary | ICD-10-CM | POA: Diagnosis not present

## 2019-11-02 DIAGNOSIS — E119 Type 2 diabetes mellitus without complications: Secondary | ICD-10-CM | POA: Diagnosis not present

## 2019-11-02 DIAGNOSIS — M4802 Spinal stenosis, cervical region: Secondary | ICD-10-CM | POA: Diagnosis not present

## 2019-11-03 DIAGNOSIS — Z4789 Encounter for other orthopedic aftercare: Secondary | ICD-10-CM | POA: Diagnosis not present

## 2019-11-03 DIAGNOSIS — D649 Anemia, unspecified: Secondary | ICD-10-CM | POA: Diagnosis not present

## 2019-11-03 DIAGNOSIS — M4802 Spinal stenosis, cervical region: Secondary | ICD-10-CM | POA: Diagnosis not present

## 2019-11-03 DIAGNOSIS — E785 Hyperlipidemia, unspecified: Secondary | ICD-10-CM | POA: Diagnosis not present

## 2019-11-03 DIAGNOSIS — M5000 Cervical disc disorder with myelopathy, unspecified cervical region: Secondary | ICD-10-CM | POA: Diagnosis not present

## 2019-11-03 DIAGNOSIS — J449 Chronic obstructive pulmonary disease, unspecified: Secondary | ICD-10-CM | POA: Diagnosis not present

## 2019-11-03 DIAGNOSIS — I10 Essential (primary) hypertension: Secondary | ICD-10-CM | POA: Diagnosis not present

## 2019-11-03 DIAGNOSIS — G9520 Unspecified cord compression: Secondary | ICD-10-CM | POA: Diagnosis not present

## 2019-11-03 DIAGNOSIS — E119 Type 2 diabetes mellitus without complications: Secondary | ICD-10-CM | POA: Diagnosis not present

## 2019-11-03 DIAGNOSIS — M1990 Unspecified osteoarthritis, unspecified site: Secondary | ICD-10-CM | POA: Diagnosis not present

## 2019-11-05 ENCOUNTER — Other Ambulatory Visit: Payer: Self-pay | Admitting: Physical Medicine and Rehabilitation

## 2019-11-07 ENCOUNTER — Other Ambulatory Visit: Payer: Self-pay | Admitting: Internal Medicine

## 2019-11-07 DIAGNOSIS — T84216A Breakdown (mechanical) of internal fixation device of vertebrae, initial encounter: Secondary | ICD-10-CM | POA: Diagnosis not present

## 2019-11-07 DIAGNOSIS — Z6841 Body Mass Index (BMI) 40.0 and over, adult: Secondary | ICD-10-CM | POA: Diagnosis not present

## 2019-11-07 DIAGNOSIS — I10 Essential (primary) hypertension: Secondary | ICD-10-CM | POA: Diagnosis not present

## 2019-11-07 NOTE — Telephone Encounter (Signed)
Patient is needing this Rx filled as soon as possible.  amLODipine (NORVASC) 10 MG tablet    [id number] [address] [place] [address] [place] Phone:  [phone]  Fax:  [phone]

## 2019-11-08 ENCOUNTER — Telehealth: Payer: Self-pay | Admitting: Family Medicine

## 2019-11-08 DIAGNOSIS — D649 Anemia, unspecified: Secondary | ICD-10-CM | POA: Diagnosis not present

## 2019-11-08 DIAGNOSIS — Z4789 Encounter for other orthopedic aftercare: Secondary | ICD-10-CM | POA: Diagnosis not present

## 2019-11-08 DIAGNOSIS — M5000 Cervical disc disorder with myelopathy, unspecified cervical region: Secondary | ICD-10-CM | POA: Diagnosis not present

## 2019-11-08 DIAGNOSIS — J449 Chronic obstructive pulmonary disease, unspecified: Secondary | ICD-10-CM | POA: Diagnosis not present

## 2019-11-08 DIAGNOSIS — R69 Illness, unspecified: Secondary | ICD-10-CM | POA: Diagnosis not present

## 2019-11-08 DIAGNOSIS — G3184 Mild cognitive impairment, so stated: Secondary | ICD-10-CM | POA: Diagnosis not present

## 2019-11-08 DIAGNOSIS — G9520 Unspecified cord compression: Secondary | ICD-10-CM | POA: Diagnosis not present

## 2019-11-08 DIAGNOSIS — H409 Unspecified glaucoma: Secondary | ICD-10-CM | POA: Diagnosis not present

## 2019-11-08 DIAGNOSIS — E119 Type 2 diabetes mellitus without complications: Secondary | ICD-10-CM | POA: Diagnosis not present

## 2019-11-08 DIAGNOSIS — G8929 Other chronic pain: Secondary | ICD-10-CM | POA: Diagnosis not present

## 2019-11-08 DIAGNOSIS — E785 Hyperlipidemia, unspecified: Secondary | ICD-10-CM | POA: Diagnosis not present

## 2019-11-08 DIAGNOSIS — M1990 Unspecified osteoarthritis, unspecified site: Secondary | ICD-10-CM | POA: Diagnosis not present

## 2019-11-08 DIAGNOSIS — M4802 Spinal stenosis, cervical region: Secondary | ICD-10-CM | POA: Diagnosis not present

## 2019-11-08 DIAGNOSIS — I2510 Atherosclerotic heart disease of native coronary artery without angina pectoris: Secondary | ICD-10-CM | POA: Diagnosis not present

## 2019-11-08 DIAGNOSIS — I10 Essential (primary) hypertension: Secondary | ICD-10-CM | POA: Diagnosis not present

## 2019-11-08 DIAGNOSIS — Z6841 Body Mass Index (BMI) 40.0 and over, adult: Secondary | ICD-10-CM | POA: Diagnosis not present

## 2019-11-08 NOTE — Telephone Encounter (Signed)
[facility] would [facility] verbal order for occupational therapy extended  two weeks to four weeks; call [place]

## 2019-11-09 NOTE — Telephone Encounter (Signed)
Verbal orders given to [facility].

## 2019-11-09 NOTE — Telephone Encounter (Signed)
ok 

## 2019-11-10 ENCOUNTER — Telehealth: Payer: Self-pay | Admitting: Family Medicine

## 2019-11-10 ENCOUNTER — Other Ambulatory Visit: Payer: Self-pay | Admitting: Family Medicine

## 2019-11-10 DIAGNOSIS — J449 Chronic obstructive pulmonary disease, unspecified: Secondary | ICD-10-CM | POA: Diagnosis not present

## 2019-11-10 DIAGNOSIS — Z4789 Encounter for other orthopedic aftercare: Secondary | ICD-10-CM | POA: Diagnosis not present

## 2019-11-10 DIAGNOSIS — D649 Anemia, unspecified: Secondary | ICD-10-CM | POA: Diagnosis not present

## 2019-11-10 DIAGNOSIS — M4802 Spinal stenosis, cervical region: Secondary | ICD-10-CM | POA: Diagnosis not present

## 2019-11-10 DIAGNOSIS — G9520 Unspecified cord compression: Secondary | ICD-10-CM | POA: Diagnosis not present

## 2019-11-10 DIAGNOSIS — E119 Type 2 diabetes mellitus without complications: Secondary | ICD-10-CM | POA: Diagnosis not present

## 2019-11-10 DIAGNOSIS — I10 Essential (primary) hypertension: Secondary | ICD-10-CM | POA: Diagnosis not present

## 2019-11-10 DIAGNOSIS — M5000 Cervical disc disorder with myelopathy, unspecified cervical region: Secondary | ICD-10-CM | POA: Diagnosis not present

## 2019-11-10 DIAGNOSIS — E785 Hyperlipidemia, unspecified: Secondary | ICD-10-CM | POA: Diagnosis not present

## 2019-11-10 DIAGNOSIS — M1990 Unspecified osteoarthritis, unspecified site: Secondary | ICD-10-CM | POA: Diagnosis not present

## 2019-11-10 MED ORDER — AMLODIPINE BESYLATE 10 MG PO TABS: 10.0000 mg | ORAL_TABLET | Freq: Every day | ORAL | 1 refills | Status: DC

## 2019-11-10 NOTE — Telephone Encounter (Signed)
Pt states the wrong BP medicine was called into her pharmacy.  She needs Besylate 10 mg refilled-- she needs this by the weekend.  [facility] [address]

## 2019-11-10 NOTE — Telephone Encounter (Signed)
I have sent in the amlodipine besylate 10 mg for her.  I took off the other amlodipine benazepril combination, which apparently was stopped in her previous hospitalization and not updated on her medicine list.  Please set up a follow-up chronic condition visit (30 minutes) which does not have to be done urgently, since I know she is still recovering from surgery.  Please also go through her current med list in our system and make sure that we are up-to-date with everything that she is currently taking.

## 2019-11-10 NOTE — Telephone Encounter (Signed)
Spoke with the pt and informed her of the message below.  Patient stated she will call back for a follow up appt and medication list will be reviewed at that time.

## 2019-11-14 DIAGNOSIS — Z4789 Encounter for other orthopedic aftercare: Secondary | ICD-10-CM | POA: Diagnosis not present

## 2019-11-14 DIAGNOSIS — J449 Chronic obstructive pulmonary disease, unspecified: Secondary | ICD-10-CM | POA: Diagnosis not present

## 2019-11-14 DIAGNOSIS — I10 Essential (primary) hypertension: Secondary | ICD-10-CM | POA: Diagnosis not present

## 2019-11-14 DIAGNOSIS — M4802 Spinal stenosis, cervical region: Secondary | ICD-10-CM | POA: Diagnosis not present

## 2019-11-14 DIAGNOSIS — M5000 Cervical disc disorder with myelopathy, unspecified cervical region: Secondary | ICD-10-CM | POA: Diagnosis not present

## 2019-11-14 DIAGNOSIS — G9520 Unspecified cord compression: Secondary | ICD-10-CM | POA: Diagnosis not present

## 2019-11-14 DIAGNOSIS — E119 Type 2 diabetes mellitus without complications: Secondary | ICD-10-CM | POA: Diagnosis not present

## 2019-11-14 DIAGNOSIS — M1990 Unspecified osteoarthritis, unspecified site: Secondary | ICD-10-CM | POA: Diagnosis not present

## 2019-11-14 DIAGNOSIS — E785 Hyperlipidemia, unspecified: Secondary | ICD-10-CM | POA: Diagnosis not present

## 2019-11-14 DIAGNOSIS — D649 Anemia, unspecified: Secondary | ICD-10-CM | POA: Diagnosis not present

## 2019-11-15 ENCOUNTER — Other Ambulatory Visit: Payer: Self-pay

## 2019-11-16 ENCOUNTER — Encounter: Payer: Self-pay | Admitting: Family Medicine

## 2019-11-16 ENCOUNTER — Ambulatory Visit (INDEPENDENT_AMBULATORY_CARE_PROVIDER_SITE_OTHER): Payer: Medicare HMO | Admitting: Family Medicine

## 2019-11-16 VITALS — BP 148/84 | HR 83 | Temp 98.0°F | Ht 62.0 in | Wt 215.9 lb

## 2019-11-16 DIAGNOSIS — I10 Essential (primary) hypertension: Secondary | ICD-10-CM

## 2019-11-16 DIAGNOSIS — B353 Tinea pedis: Secondary | ICD-10-CM | POA: Diagnosis not present

## 2019-11-16 DIAGNOSIS — E785 Hyperlipidemia, unspecified: Secondary | ICD-10-CM

## 2019-11-16 DIAGNOSIS — J301 Allergic rhinitis due to pollen: Secondary | ICD-10-CM | POA: Diagnosis not present

## 2019-11-16 DIAGNOSIS — R600 Localized edema: Secondary | ICD-10-CM

## 2019-11-16 DIAGNOSIS — B351 Tinea unguium: Secondary | ICD-10-CM | POA: Diagnosis not present

## 2019-11-16 DIAGNOSIS — E538 Deficiency of other specified B group vitamins: Secondary | ICD-10-CM

## 2019-11-16 DIAGNOSIS — N179 Acute kidney failure, unspecified: Secondary | ICD-10-CM | POA: Diagnosis not present

## 2019-11-16 DIAGNOSIS — J3089 Other allergic rhinitis: Secondary | ICD-10-CM | POA: Diagnosis not present

## 2019-11-16 LAB — COMPREHENSIVE METABOLIC PANEL
ALT: 6 U/L (ref 0–35)
AST: 11 U/L (ref 0–37)
Albumin: 3.9 g/dL (ref 3.5–5.2)
Alkaline Phosphatase: 110 U/L (ref 39–117)
BUN: 16 mg/dL (ref 6–23)
CO2: 30 mEq/L (ref 19–32)
Calcium: 9.2 mg/dL (ref 8.4–10.5)
Chloride: 102 mEq/L (ref 96–112)
Creatinine, Ser: 0.96 mg/dL (ref 0.40–1.20)
GFR: 68.05 mL/min (ref >60.00)
Glucose, Bld: 112 mg/dL — ABNORMAL HIGH (ref 70–99)
Potassium: 3.3 mEq/L — ABNORMAL LOW (ref 3.5–5.1)
Sodium: 139 mEq/L (ref 135–145)
Total Bilirubin: 0.5 mg/dL (ref 0.2–1.2)
Total Protein: 7.3 g/dL (ref 6.0–8.3)

## 2019-11-16 MED ORDER — CLOTRIMAZOLE-BETAMETHASONE 1-0.05 % EX CREA: 1.0000 "application " | TOPICAL_CREAM | Freq: Two times a day (BID) | CUTANEOUS | 0 refills | Status: DC

## 2019-11-16 MED ORDER — FUROSEMIDE 20 MG PO TABS: 20.0000 mg | ORAL_TABLET | Freq: Every day | ORAL | 3 refills | Status: DC | PRN

## 2019-11-16 MED ORDER — POTASSIUM CHLORIDE ER 10 MEQ PO CPCR: ORAL_CAPSULE | ORAL | 1 refills | Status: DC

## 2019-11-16 NOTE — Progress Notes (Signed)
ANNE-LOUISE FIEBIG DOB: Jun 13, 1941 Encounter date: 11/16/2019  This is a 79 y.o. female who presents with Chief Complaint  Patient presents with  . 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  She underwent a C5 cervical corpectomy and arthrodesis C4-C6 on 3/18.  Hospital course was significant for AKI due to prerenal azotemia.  She had issues with her discomfort and difficulty with swallowing.  Recently re-admitted from 10/21/2019 to 10/24/2019 for hardware failure of the anterior column spine.  She was taken to the operating room where cervical plate was removed on a shorter plate was placed from C4-C6.  Has been back at home from rehab for last couple of weeks. Still has occupational therapy at home. Voice is hoarse from intubation, but has to taking time with eating. Some choking with water, but learning how to swallow.   Feels like pressure is going up and down with new bp medication. They stopped the benazepril and hctz due to AKI but she states that she is more swollen and bp not as well controlled. She is drinking more water.   Cymbalta was hard for her to tolerate. When she tried to take higher dose she was "talking crazy".   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 Feels a little stronger. Wanting to walk with a cane.    Allergies  Allergen Reactions  . Dust Mite Extract Itching and Cough   Current Meds  Medication Sig  . acetaminophen (TYLENOL) 500 MG tablet Take 500 mg by mouth every 6 (six) hours as needed for mild pain.  Marland Kitchen amLODipine (NORVASC) 10 MG tablet Take 1 tablet (10 mg total) by mouth daily.  . Ascorbic Acid (VITAMIN C) 1000 MG tablet Take 1,000 mg by mouth daily.   . Cholecalciferol (VITAMIN D) 2000 UNITS CAPS Take 4,000 Units by mouth daily.   . cyanocobalamin 2000 MCG tablet Take 2,000 mcg by mouth daily.    . DULoxetine (CYMBALTA) 20 MG  capsule One pill daily for a week. Then increase to two pills per day for a week. Then increase to three pills daily--take at bedtime. (Patient taking differently: Take 20 mg by mouth at bedtime. )  . fexofenadine (ALLEGRA) 180 MG tablet Take 180 mg by mouth daily as needed for allergies.   Marland Kitchen glipiZIDE (GLUCOTROL XL) 5 MG 24 hr tablet TAKE 1 TABLET BY MOUTH EVERY DAY (Patient taking differently: Take 5 mg by mouth daily. )  . glucose blood (ONETOUCH VERIO) test strip TEST TWICE A DAY.  Dx E11.9  . Lancets 30G MISC USE TWICE DAILY AS DIRECTED FOR GLUCOSE TESTING AND MONITORING  . metFORMIN (GLUCOPHAGE-XR) 500 MG 24 hr tablet Take 500 mg by mouth 2 (two) times daily.  . metoprolol succinate (TOPROL-XL) 50 MG 24 hr tablet Take 1 tablet (50 mg total) by mouth daily. Take with or immediately following a meal.  . Multiple Vitamin (MULTIVITAMIN WITH MINERALS) TABS tablet Take 1 tablet by mouth daily.  Marland Kitchen omeprazole (PRILOSEC) 40 MG capsule Take 40 mg by mouth at bedtime.  Marland Kitchen ONE TOUCH LANCETS MISC Test twice daily.  . pioglitazone (ACTOS) 45 MG tablet TAKE 1 TABLET BY MOUTH EVERY DAY (Patient taking differently: Take 45 mg by mouth daily. )  . pravastatin (PRAVACHOL) 40 MG tablet Take 1 tablet (40 mg total) by mouth daily. (Patient taking differently: Take 40 mg by mouth 2 (two) times a week. )  .  prednisoLONE acetate (PRED FORTE) 1 % ophthalmic suspension Place 1 drop into both eyes See admin instructions. Place 1 drop into left eye three times daily Place 1 drop into right eye once daily  . Probiotic Product (PROBIOTIC-10 PO) Take 1 capsule by mouth daily.   Marland Kitchen senna-docusate (SENOKOT-S) 8.6-50 MG tablet Take 1 tablet by mouth at bedtime. (Patient taking differently: Take 1 tablet by mouth at bedtime as needed for mild constipation. )  . sitaGLIPtin (JANUVIA) 100 MG tablet Take 1 tablet (100 mg total) by mouth daily.    Review of Systems  Constitutional: Negative for chills, fatigue and fever.   Respiratory: Negative for cough, chest tightness, shortness of breath and wheezing.   Cardiovascular: Negative for chest pain, palpitations and leg swelling.  Gastrointestinal: Negative for constipation and diarrhea.  Genitourinary: Negative for dysuria.  Musculoskeletal: Neck pain: controlled.  Neurological: Positive for weakness (but improving). Negative for dizziness and headaches.    Objective:  BP (!) 148/84 (BP Location: Left Arm, Patient Position: Sitting, Cuff Size: Large)   Pulse 83   Temp 98 F (36.7 C) (Temporal)   Ht 5\' 2"  (1.575 m)   Wt 215 lb 14.4 oz (97.9 kg)   SpO2 95%   BMI 39.49 kg/m   Weight: 215 lb 14.4 oz (97.9 kg)   BP Readings from Last 3 Encounters:  11/16/19 (!) 148/84  10/24/19 131/69  10/07/19 114/70   Wt Readings from Last 3 Encounters:  11/16/19 215 lb 14.4 oz (97.9 kg)  10/21/19 215 lb (97.5 kg)  10/07/19 210 lb 4.8 oz (95.4 kg)    Physical Exam Constitutional:      General: She is not in acute distress.    Appearance: She is well-developed.  Cardiovascular:     Rate and Rhythm: Normal rate and regular rhythm.     Pulses:          Dorsalis pedis pulses are 2+ on the right side and 2+ on the left side.     Heart sounds: Normal heart sounds. No murmur. No friction rub.  Pulmonary:     Effort: Pulmonary effort is normal. No respiratory distress.     Breath sounds: Normal breath sounds. No wheezing or rales.  Musculoskeletal:     Right lower leg: 1+ Edema present.     Left lower leg: 2+ Edema present.  Feet:     Comments: There is significant scaling left plantar aspect of the foot up and to enter digits.  There is onychomycosis on multiple toenails bilateral feet. Neurological:     Mental Status: She is alert and oriented to person, place, and time.     Comments: Grip strength is full  Psychiatric:        Behavior: Behavior normal.     Assessment/Plan 1. Lower extremity edema We are going to start with Lasix daily, and then  consider adjustment of blood pressure medications pending her blood work.  She had some kidney strain in the hospital, so her ACE and hydrochlorothiazide were stopped.  I suspect edema secondary to stopping the hydrochlorothiazide as well as continuation of the amlodipine.  I have asked him to keep checking blood pressures at home as well.  We discussed elevating legs whenever sitting.  She is already wearing compression stockings.  We discussed elevating at bedtime.  We discussed limiting salt in diet. - furosemide (LASIX) 20 MG tablet; Take 1 tablet (20 mg total) by mouth daily as needed.  Dispense: 30 tablet; Refill: 3  2.  Onychomycosis Toenails are thickened and with current neck status she is unable to trim these herself. - Ambulatory referral to Podiatry  3. Tinea pedis of left foot Foot is scaly and appears to have fungal infection, but there is also some dryness which I feel would respond well to both an antifungal as well as steroid cream.  Husband will help apply this.  Let me know if no improvement within 2 weeks. - clotrimazole-betamethasone (LOTRISONE) cream; Apply 1 application topically 2 (two) times daily.  Dispense: 30 g; Refill: 0  4. Essential hypertension See above.  We need to monitor and potentially adjust medications.  She likely will need to be on some sort of diuretic to help compensate for the amlodipine.  5. Dyslipidemia She is tolerating pravastatin.  Continue this twice a week.  6. AKI (acute kidney injury) ([place]) We will recheck to make sure the kidney function is still stable.  Last renal function check in the system was back to normal range. - Comprehensive metabolic panel; Future - Comprehensive metabolic panel  7. B12 deficiency B12 levels are normal when checked in the last few months.    Return for pending bloodwork. 40 minutes spent in room with patient, talking through blood pressure control and medication options with patient and husband, exam, and  charting.  Hospital review from 2 hospitalizations, lab review as well as review from home care orders.   [name], MD

## 2019-11-16 NOTE — Patient Instructions (Addendum)
*  take the lasix daily to help get out fluid from legs until I touch base with you when I get bloodwork. Keep tracking blood pressures twice daily until we touch base. Whenever sitting keep feet up (preferably above heart level); keep them elevated in bed as well. Limit sodium intake since this can worsen swelling.   *use cream on foot as directed.   *ok to stay on lower dose of cymbalta.

## 2019-11-17 DIAGNOSIS — I10 Essential (primary) hypertension: Secondary | ICD-10-CM | POA: Diagnosis not present

## 2019-11-17 DIAGNOSIS — D649 Anemia, unspecified: Secondary | ICD-10-CM | POA: Diagnosis not present

## 2019-11-17 DIAGNOSIS — E119 Type 2 diabetes mellitus without complications: Secondary | ICD-10-CM | POA: Diagnosis not present

## 2019-11-17 DIAGNOSIS — Z4789 Encounter for other orthopedic aftercare: Secondary | ICD-10-CM | POA: Diagnosis not present

## 2019-11-17 DIAGNOSIS — E785 Hyperlipidemia, unspecified: Secondary | ICD-10-CM | POA: Diagnosis not present

## 2019-11-17 DIAGNOSIS — M5000 Cervical disc disorder with myelopathy, unspecified cervical region: Secondary | ICD-10-CM | POA: Diagnosis not present

## 2019-11-17 DIAGNOSIS — M4802 Spinal stenosis, cervical region: Secondary | ICD-10-CM | POA: Diagnosis not present

## 2019-11-17 DIAGNOSIS — G9520 Unspecified cord compression: Secondary | ICD-10-CM | POA: Diagnosis not present

## 2019-11-17 DIAGNOSIS — M1990 Unspecified osteoarthritis, unspecified site: Secondary | ICD-10-CM | POA: Diagnosis not present

## 2019-11-17 DIAGNOSIS — J449 Chronic obstructive pulmonary disease, unspecified: Secondary | ICD-10-CM | POA: Diagnosis not present

## 2019-11-21 DIAGNOSIS — G9520 Unspecified cord compression: Secondary | ICD-10-CM | POA: Diagnosis not present

## 2019-11-21 DIAGNOSIS — I10 Essential (primary) hypertension: Secondary | ICD-10-CM | POA: Diagnosis not present

## 2019-11-21 DIAGNOSIS — E785 Hyperlipidemia, unspecified: Secondary | ICD-10-CM | POA: Diagnosis not present

## 2019-11-21 DIAGNOSIS — J449 Chronic obstructive pulmonary disease, unspecified: Secondary | ICD-10-CM | POA: Diagnosis not present

## 2019-11-21 DIAGNOSIS — M5000 Cervical disc disorder with myelopathy, unspecified cervical region: Secondary | ICD-10-CM | POA: Diagnosis not present

## 2019-11-21 DIAGNOSIS — Z4789 Encounter for other orthopedic aftercare: Secondary | ICD-10-CM | POA: Diagnosis not present

## 2019-11-21 DIAGNOSIS — E119 Type 2 diabetes mellitus without complications: Secondary | ICD-10-CM | POA: Diagnosis not present

## 2019-11-21 DIAGNOSIS — M1990 Unspecified osteoarthritis, unspecified site: Secondary | ICD-10-CM | POA: Diagnosis not present

## 2019-11-21 DIAGNOSIS — M4802 Spinal stenosis, cervical region: Secondary | ICD-10-CM | POA: Diagnosis not present

## 2019-11-21 DIAGNOSIS — D649 Anemia, unspecified: Secondary | ICD-10-CM | POA: Diagnosis not present

## 2019-11-22 ENCOUNTER — Other Ambulatory Visit: Payer: Self-pay | Admitting: Internal Medicine

## 2019-11-22 ENCOUNTER — Telehealth: Payer: Self-pay | Admitting: Family Medicine

## 2019-11-22 NOTE — Telephone Encounter (Signed)
ok 

## 2019-11-22 NOTE — Telephone Encounter (Signed)
Spoke with [place] and informed her of the approval for verbal orders as below.

## 2019-11-22 NOTE — Telephone Encounter (Signed)
Would like to extend home health care 1 x week for 3 weeks  [facility] [phone]

## 2019-11-24 DIAGNOSIS — R69 Illness, unspecified: Secondary | ICD-10-CM | POA: Diagnosis not present

## 2019-11-25 ENCOUNTER — Other Ambulatory Visit: Payer: Self-pay | Admitting: Family Medicine

## 2019-11-25 ENCOUNTER — Other Ambulatory Visit: Payer: Self-pay | Admitting: Physical Medicine and Rehabilitation

## 2019-11-25 DIAGNOSIS — B353 Tinea pedis: Secondary | ICD-10-CM

## 2019-11-29 ENCOUNTER — Other Ambulatory Visit: Payer: Self-pay | Admitting: Internal Medicine

## 2019-11-29 DIAGNOSIS — D649 Anemia, unspecified: Secondary | ICD-10-CM | POA: Diagnosis not present

## 2019-11-29 DIAGNOSIS — I10 Essential (primary) hypertension: Secondary | ICD-10-CM | POA: Diagnosis not present

## 2019-11-29 DIAGNOSIS — G9520 Unspecified cord compression: Secondary | ICD-10-CM | POA: Diagnosis not present

## 2019-11-29 DIAGNOSIS — E785 Hyperlipidemia, unspecified: Secondary | ICD-10-CM | POA: Diagnosis not present

## 2019-11-29 DIAGNOSIS — M4802 Spinal stenosis, cervical region: Secondary | ICD-10-CM | POA: Diagnosis not present

## 2019-11-29 DIAGNOSIS — J449 Chronic obstructive pulmonary disease, unspecified: Secondary | ICD-10-CM | POA: Diagnosis not present

## 2019-11-29 DIAGNOSIS — E119 Type 2 diabetes mellitus without complications: Secondary | ICD-10-CM | POA: Diagnosis not present

## 2019-11-29 DIAGNOSIS — Z4789 Encounter for other orthopedic aftercare: Secondary | ICD-10-CM | POA: Diagnosis not present

## 2019-11-29 DIAGNOSIS — M5000 Cervical disc disorder with myelopathy, unspecified cervical region: Secondary | ICD-10-CM | POA: Diagnosis not present

## 2019-11-29 DIAGNOSIS — M1990 Unspecified osteoarthritis, unspecified site: Secondary | ICD-10-CM | POA: Diagnosis not present

## 2019-11-30 ENCOUNTER — Other Ambulatory Visit: Payer: Self-pay | Admitting: Family Medicine

## 2019-11-30 ENCOUNTER — Telehealth: Payer: Self-pay | Admitting: Family Medicine

## 2019-11-30 DIAGNOSIS — M4802 Spinal stenosis, cervical region: Secondary | ICD-10-CM | POA: Diagnosis not present

## 2019-11-30 DIAGNOSIS — D649 Anemia, unspecified: Secondary | ICD-10-CM | POA: Diagnosis not present

## 2019-11-30 DIAGNOSIS — E785 Hyperlipidemia, unspecified: Secondary | ICD-10-CM | POA: Diagnosis not present

## 2019-11-30 DIAGNOSIS — B353 Tinea pedis: Secondary | ICD-10-CM

## 2019-11-30 DIAGNOSIS — M5000 Cervical disc disorder with myelopathy, unspecified cervical region: Secondary | ICD-10-CM | POA: Diagnosis not present

## 2019-11-30 DIAGNOSIS — N179 Acute kidney failure, unspecified: Secondary | ICD-10-CM

## 2019-11-30 DIAGNOSIS — I10 Essential (primary) hypertension: Secondary | ICD-10-CM

## 2019-11-30 DIAGNOSIS — G9520 Unspecified cord compression: Secondary | ICD-10-CM | POA: Diagnosis not present

## 2019-11-30 DIAGNOSIS — E119 Type 2 diabetes mellitus without complications: Secondary | ICD-10-CM | POA: Diagnosis not present

## 2019-11-30 DIAGNOSIS — Z4789 Encounter for other orthopedic aftercare: Secondary | ICD-10-CM | POA: Diagnosis not present

## 2019-11-30 DIAGNOSIS — J449 Chronic obstructive pulmonary disease, unspecified: Secondary | ICD-10-CM | POA: Diagnosis not present

## 2019-11-30 DIAGNOSIS — M1990 Unspecified osteoarthritis, unspecified site: Secondary | ICD-10-CM | POA: Diagnosis not present

## 2019-11-30 MED ORDER — CLOTRIMAZOLE-BETAMETHASONE 1-0.05 % EX CREA: TOPICAL_CREAM | CUTANEOUS | 1 refills | Status: DC

## 2019-11-30 MED ORDER — BENAZEPRIL HCL 20 MG PO TABS: 20.0000 mg | ORAL_TABLET | Freq: Every day | ORAL | 1 refills | Status: DC

## 2019-11-30 MED ORDER — OMEPRAZOLE 40 MG PO CPDR: 40.0000 mg | DELAYED_RELEASE_CAPSULE | Freq: Every day | ORAL | 1 refills | Status: DC

## 2019-11-30 MED ORDER — DULOXETINE HCL 20 MG PO CPEP: 20.0000 mg | ORAL_CAPSULE | Freq: Every day | ORAL | 1 refills | Status: DC

## 2019-11-30 NOTE — Telephone Encounter (Signed)
Beth from [facility] stated that the pt's bp has her concerned. Yesterday the pt's bp was 155/100. Pt stated to her that she MAY have forgotten to take bp medication. Beth re-checked her bp today and it was 164/96.    Medication Refill:  Cymbalta Clotrimazole cream Omeprazole   [facility]: [phone]   Eustaquio Maize can be reached at [phone] -ok to leave a detailed message

## 2019-11-30 NOTE — Telephone Encounter (Signed)
I left a message for Doris Jackson to return my call.  I spoke with the patient and informed her of the message below.  Lab and follow up appts were scheduled. Patient  is aware the Rxs were sent to the pharmacy and stated the dose of Cymbalta below is correct.

## 2019-11-30 NOTE — Telephone Encounter (Signed)
Has patient been taking this?  There was a previous message regarding blood pressure control, and she was taken off hydrochlorothiazide as well as benazepril at last hospitalization.  Please make sure medication list is up-to-date.  If she has been taking the hydrochlorothiazide I need to know about it.

## 2019-11-30 NOTE — Telephone Encounter (Signed)
*  Her blood pressure was also elevated when she was here in the office last.  I suspect it is because they held 2 of her blood pressure medications on discharge from the hospital.  Since kidney function has been stable, let us add back in the benazepril.  I have sent this to the pharmacy for her and a 20 mg dose.  For her to start this and continue to monitor her blood pressures.  I would like to recheck a BMP in 2 weeks time.  Please order and schedule lab visit for patient.  I refilled other requested medications.  Please confirm the Cymbalta dose, start with a titration up for how patient is taking medication.  I sent in 20 mg daily, but if she is taking a different dose, please adjust the sig (she might be up to 60 mg daily).  I would like for her to follow back up in the office for blood pressure recheck in 1 month.  Let me know sooner if any problems with starting the benazepril, and make sure we get lab work between now and that visit.

## 2019-12-01 ENCOUNTER — Other Ambulatory Visit: Payer: Self-pay | Admitting: Family Medicine

## 2019-12-01 NOTE — Telephone Encounter (Signed)
Please see prior note from pharmacy.

## 2019-12-01 NOTE — Telephone Encounter (Signed)
Patient is aware of her medications and that a prescription is at the pharmacy.

## 2019-12-01 NOTE — Telephone Encounter (Signed)
There is not a amlodipine-hctz combo? Wondering if she is referring to amlodipine-benazepril combo? I would prefer to stay off combos until we get blood pressure controlled so that we have more ability to adjust individual meds. If she is getting leg swelling then we can consider adding hctz, but I would prefer to start with benazepril due to kidney strain previously.

## 2019-12-01 NOTE — Telephone Encounter (Signed)
Pt is calling in needing refill on benazepril (LOTENSIN) 20 MG    Pharm:  [facility] on [address]

## 2019-12-01 NOTE — Telephone Encounter (Signed)
[facility] as the Rx below was sent on 6/1.  Per [facility] at [facility], the pt was told the Rx for Benazepril was at the pharmacy and ready for pick up but she told them she does not want this and wants the Amlodipine-HCTZ combo instead.  I advised Alice there is a note in the pts chart from a visit that states the combo was discontinued and the PCP sent in plain Amlodipine on 5/13.  Message sent to PCP.

## 2019-12-05 DIAGNOSIS — M5000 Cervical disc disorder with myelopathy, unspecified cervical region: Secondary | ICD-10-CM | POA: Diagnosis not present

## 2019-12-05 DIAGNOSIS — Z4789 Encounter for other orthopedic aftercare: Secondary | ICD-10-CM | POA: Diagnosis not present

## 2019-12-05 DIAGNOSIS — D649 Anemia, unspecified: Secondary | ICD-10-CM | POA: Diagnosis not present

## 2019-12-05 DIAGNOSIS — M1990 Unspecified osteoarthritis, unspecified site: Secondary | ICD-10-CM | POA: Diagnosis not present

## 2019-12-05 DIAGNOSIS — G9520 Unspecified cord compression: Secondary | ICD-10-CM | POA: Diagnosis not present

## 2019-12-05 DIAGNOSIS — I10 Essential (primary) hypertension: Secondary | ICD-10-CM | POA: Diagnosis not present

## 2019-12-05 DIAGNOSIS — E119 Type 2 diabetes mellitus without complications: Secondary | ICD-10-CM | POA: Diagnosis not present

## 2019-12-05 DIAGNOSIS — J449 Chronic obstructive pulmonary disease, unspecified: Secondary | ICD-10-CM | POA: Diagnosis not present

## 2019-12-05 DIAGNOSIS — E785 Hyperlipidemia, unspecified: Secondary | ICD-10-CM | POA: Diagnosis not present

## 2019-12-05 DIAGNOSIS — M4802 Spinal stenosis, cervical region: Secondary | ICD-10-CM | POA: Diagnosis not present

## 2019-12-06 DIAGNOSIS — G9520 Unspecified cord compression: Secondary | ICD-10-CM | POA: Diagnosis not present

## 2019-12-06 DIAGNOSIS — J449 Chronic obstructive pulmonary disease, unspecified: Secondary | ICD-10-CM | POA: Diagnosis not present

## 2019-12-06 DIAGNOSIS — E119 Type 2 diabetes mellitus without complications: Secondary | ICD-10-CM | POA: Diagnosis not present

## 2019-12-06 DIAGNOSIS — E785 Hyperlipidemia, unspecified: Secondary | ICD-10-CM | POA: Diagnosis not present

## 2019-12-06 DIAGNOSIS — M5000 Cervical disc disorder with myelopathy, unspecified cervical region: Secondary | ICD-10-CM | POA: Diagnosis not present

## 2019-12-06 DIAGNOSIS — D649 Anemia, unspecified: Secondary | ICD-10-CM | POA: Diagnosis not present

## 2019-12-06 DIAGNOSIS — M1990 Unspecified osteoarthritis, unspecified site: Secondary | ICD-10-CM | POA: Diagnosis not present

## 2019-12-06 DIAGNOSIS — Z4789 Encounter for other orthopedic aftercare: Secondary | ICD-10-CM | POA: Diagnosis not present

## 2019-12-06 DIAGNOSIS — M4802 Spinal stenosis, cervical region: Secondary | ICD-10-CM | POA: Diagnosis not present

## 2019-12-06 DIAGNOSIS — I10 Essential (primary) hypertension: Secondary | ICD-10-CM | POA: Diagnosis not present

## 2019-12-07 ENCOUNTER — Telehealth: Payer: Self-pay | Admitting: Family Medicine

## 2019-12-07 DIAGNOSIS — J301 Allergic rhinitis due to pollen: Secondary | ICD-10-CM | POA: Diagnosis not present

## 2019-12-07 DIAGNOSIS — I10 Essential (primary) hypertension: Secondary | ICD-10-CM | POA: Diagnosis not present

## 2019-12-07 DIAGNOSIS — J3089 Other allergic rhinitis: Secondary | ICD-10-CM | POA: Diagnosis not present

## 2019-12-07 NOTE — Telephone Encounter (Signed)
Patient's husband walked in and stated he wanted someone to go out to the car and check his wife's BP.  I consulted with Roselyn Reef and she stated she would have to be on the schedule for an appointment so it could be documented.  There were no appointments for the time the patient came, so Roselyn Reef advised the patient go to Urgent Care.  Patient's husband stated understanding and said he was going to Urgent Care with the patient.

## 2019-12-09 ENCOUNTER — Other Ambulatory Visit: Payer: Self-pay

## 2019-12-12 ENCOUNTER — Encounter: Payer: Self-pay | Admitting: Family Medicine

## 2019-12-12 ENCOUNTER — Ambulatory Visit (INDEPENDENT_AMBULATORY_CARE_PROVIDER_SITE_OTHER): Payer: Medicare HMO | Admitting: Family Medicine

## 2019-12-12 ENCOUNTER — Other Ambulatory Visit: Payer: Self-pay

## 2019-12-12 VITALS — BP 142/84 | Temp 97.7°F | Wt 218.0 lb

## 2019-12-12 DIAGNOSIS — J3089 Other allergic rhinitis: Secondary | ICD-10-CM | POA: Diagnosis not present

## 2019-12-12 DIAGNOSIS — J301 Allergic rhinitis due to pollen: Secondary | ICD-10-CM | POA: Diagnosis not present

## 2019-12-12 DIAGNOSIS — I10 Essential (primary) hypertension: Secondary | ICD-10-CM

## 2019-12-12 NOTE — Progress Notes (Signed)
Doris Jackson DOB: May 05, 1941 Encounter date: 12/12/2019  This is a 79 y.o. female who presents with Chief Complaint  Patient presents with  . Follow-up    HTN    History of present illness: Patient was at urgent care less than a week ago with elevated blood pressure.  Pressures have been higher at home.  When she was in the hospital, her diuretic and ACE inhibitor were stopped due to elevated creatinine.  Since coming into the hospital, amlodipine has been increased to 10 mg, benazepril was restarted at 20 mg, but recently advised to increased to 40 mg in urgent care (she wanted to wait until she saw me for this increase).  She did not pick up the benazepril when I sent it in, so has only been taking this medication for just over 1 week.  Seems to fluctuate a lot at home. Takes medications in the morning. Feels like numbers are about the same as they were a week ago. Ranging about 135 (once) but more in the 562'Z systolic.   Wearing support stockings. Trying to elevate legs. Not taking fluid pill daily because she has too much urination.   Had headache sat night. Took tylenol. This is not typical for her.   No chest pain or pressure.   She is working on getting around a little more. Able to walk through store. Has had some congestion. She is taking allegra - (still getting allergy shots).  Doing well from a pain standpoint.  She has increased mobility in her neck.  She has been released from wearing her neck brace, although still feels more comfortable when wearing it.  She does feel like she is getting more strength in her upper extremities.  Allergies  Allergen Reactions  . Dust Mite Extract Itching and Cough   Current Meds  Medication Sig  . acetaminophen (TYLENOL) 500 MG tablet Take 500 mg by mouth every 6 (six) hours as needed for mild pain.  Marland Kitchen amLODipine (NORVASC) 10 MG tablet Take 1 tablet (10 mg total) by mouth daily.  . Ascorbic Acid (VITAMIN C) 1000 MG tablet Take 1,000 mg  by mouth daily.   . benazepril (LOTENSIN) 20 MG tablet Take 1 tablet (20 mg total) by mouth daily.  . Cholecalciferol (VITAMIN D) 2000 UNITS CAPS Take 4,000 Units by mouth daily.   . clotrimazole-betamethasone (LOTRISONE) cream APPLY TO AFFECTED AREA TWICE A DAY  . cyanocobalamin 2000 MCG tablet Take 2,000 mcg by mouth daily.    . DULoxetine (CYMBALTA) 20 MG capsule Take 1 capsule (20 mg total) by mouth at bedtime.  Marland Kitchen FERREX 150 150 MG capsule TAKE 1 CAPSULE BY MOUTH EVERY DAY  . fexofenadine (ALLEGRA) 180 MG tablet Take 180 mg by mouth daily as needed for allergies.   . furosemide (LASIX) 20 MG tablet Take 1 tablet (20 mg total) by mouth daily as needed.  Marland Kitchen glipiZIDE (GLUCOTROL XL) 5 MG 24 hr tablet TAKE 1 TABLET BY MOUTH EVERY DAY (Patient taking differently: Take 5 mg by mouth daily. )  . Lancets 30G MISC USE TWICE DAILY AS DIRECTED FOR GLUCOSE TESTING AND MONITORING  . metFORMIN (GLUCOPHAGE-XR) 500 MG 24 hr tablet Take 500 mg by mouth 2 (two) times daily.  . metoprolol succinate (TOPROL-XL) 50 MG 24 hr tablet Take 1 tablet (50 mg total) by mouth daily. Take with or immediately following a meal.  . Multiple Vitamin (MULTIVITAMIN WITH MINERALS) TABS tablet Take 1 tablet by mouth daily.  Marland Kitchen omeprazole (PRILOSEC) 40  MG capsule Take 1 capsule (40 mg total) by mouth at bedtime.  Marland Kitchen ONE TOUCH LANCETS MISC Test twice daily.  Glory Rosebush VERIO test strip TEST TWICE A DAY. DX E11.9  . pioglitazone (ACTOS) 45 MG tablet TAKE 1 TABLET BY MOUTH EVERY DAY (Patient taking differently: Take 45 mg by mouth daily. )  . potassium chloride (MICRO-K) 10 MEQ CR capsule Take 1 capsule BID x 3 days, then take with use of lasix  . pravastatin (PRAVACHOL) 40 MG tablet Take 1 tablet (40 mg total) by mouth daily. (Patient taking differently: Take 40 mg by mouth 2 (two) times a week. )  . prednisoLONE acetate (PRED FORTE) 1 % ophthalmic suspension Place 1 drop into both eyes See admin instructions. Place 1 drop into left eye  three times daily Place 1 drop into right eye once daily  . Probiotic Product (PROBIOTIC-10 PO) Take 1 capsule by mouth daily.   Marland Kitchen senna-docusate (SENOKOT-S) 8.6-50 MG tablet Take 1 tablet by mouth at bedtime. (Patient taking differently: Take 1 tablet by mouth at bedtime as needed for mild constipation. )  . sitaGLIPtin (JANUVIA) 100 MG tablet Take 1 tablet (100 mg total) by mouth daily.    Review of Systems  Constitutional: Negative for chills, fatigue and fever.  Respiratory: Negative for cough, chest tightness, shortness of breath and wheezing.   Cardiovascular: Negative for chest pain, palpitations and leg swelling.    Objective:  BP (!) 142/84   Temp 97.7 F (36.5 C) (Oral)   Wt 218 lb (98.9 kg)   BMI 39.87 kg/m   Weight: 218 lb (98.9 kg)   BP Readings from Last 3 Encounters:  12/12/19 (!) 142/84  11/16/19 (!) 148/84  10/24/19 131/69   Wt Readings from Last 3 Encounters:  12/12/19 218 lb (98.9 kg)  11/16/19 215 lb 14.4 oz (97.9 kg)  10/21/19 215 lb (97.5 kg)    Physical Exam Constitutional:      General: She is not in acute distress.    Appearance: She is well-developed.  Cardiovascular:     Rate and Rhythm: Normal rate and regular rhythm.     Heart sounds: Normal heart sounds. No murmur heard.  No friction rub.  Pulmonary:     Effort: Pulmonary effort is normal. No respiratory distress.     Breath sounds: Normal breath sounds. No wheezing or rales.  Musculoskeletal:     Right lower leg: No edema.     Left lower leg: No edema.  Neurological:     Mental Status: She is alert and oriented to person, place, and time.  Psychiatric:        Behavior: Behavior normal.     Assessment/Plan  1. Essential hypertension Since she started the benazepril, her systolic blood pressure has come down more than 20 points.  I am hesitant to increase medication further at this point, especially with her being more active currently.  We discussed trying to avoid over correcting  with risk of causing hypotension, lightheadedness, and fall risk.  I have asked them to update me with blood pressures next week after she completes bloodwork (repeat bmp to recheck kidney function/electrolytes since restarting benazepril).  I have asked her to go ahead and take an extra benazepril if her systolic is over [id number] after taking medications.  We reviewed proper technique for checking blood pressure.  We reviewed lower sodium diet and how this is helpful for blood pressure.  She is also recently started allergy medication, so I encouraged her  to make sure there is not a decongestant component of this and we discussed how any decongestants may also raise her blood pressure.    Return in about 1 month (around 01/11/2020) for keep bloodwork appointment next monday.    [name], MD

## 2019-12-12 NOTE — Patient Instructions (Addendum)
Check the allegra at home and make sure you do not have the "D" version.   Continue checking blood pressures at home. Check 1 hour after morning medications and after sitting for 5 minutes - with arm at heart level. If pressures are over 213'Y systolic (over next few days) then you can take an extra benazepril. If your pressures are less than 150, then just record and we will review when I get your bloodwork results next week.  DASH Eating Plan DASH stands for "Dietary Approaches to Stop Hypertension." The DASH eating plan is a healthy eating plan that has been shown to reduce high blood pressure (hypertension). It may also reduce your risk for type 2 diabetes, heart disease, and stroke. The DASH eating plan may also help with weight loss. What are tips for following this plan?  General guidelines  Avoid eating more than 2,300 mg (milligrams) of salt (sodium) a day. If you have hypertension, you may need to reduce your sodium intake to 1,500 mg a day.  Limit alcohol intake to no more than 1 drink a day for nonpregnant women and 2 drinks a day for men. One drink equals 12 oz of beer, 5 oz of wine, or 1 oz of hard liquor.  Work with your health care provider to maintain a healthy body weight or to lose weight. Ask what an ideal weight is for you.  Get at least 30 minutes of exercise that causes your heart to beat faster (aerobic exercise) most days of the week. Activities may include walking, swimming, or biking.  Work with your health care provider or diet and nutrition specialist (dietitian) to adjust your eating plan to your individual calorie needs. Reading food labels   Check food labels for the amount of sodium per serving. Choose foods with less than 5 percent of the Daily Value of sodium. Generally, foods with less than 300 mg of sodium per serving fit into this eating plan.  To find whole grains, look for the word "whole" as the first word in the ingredient list. Shopping  Buy  products labeled as "low-sodium" or "no salt added."  Buy fresh foods. Avoid canned foods and premade or frozen meals. Cooking  Avoid adding salt when cooking. Use salt-free seasonings or herbs instead of table salt or sea salt. Check with your health care provider or pharmacist before using salt substitutes.  Do not fry foods. Cook foods using healthy methods such as baking, boiling, grilling, and broiling instead.  Cook with heart-healthy oils, such as olive, canola, soybean, or sunflower oil. Meal planning  Eat a balanced diet that includes: ? 5 or more servings of fruits and vegetables each day. At each meal, try to fill half of your plate with fruits and vegetables. ? Up to 6-8 servings of whole grains each day. ? Less than 6 oz of lean meat, poultry, or fish each day. A 3-oz serving of meat is about the same size as a deck of cards. One egg equals 1 oz. ? 2 servings of low-fat dairy each day. ? A serving of nuts, seeds, or beans 5 times each week. ? Heart-healthy fats. Healthy fats called Omega-3 fatty acids are found in foods such as flaxseeds and coldwater fish, like sardines, salmon, and mackerel.  Limit how much you eat of the following: ? Canned or prepackaged foods. ? Food that is high in trans fat, such as fried foods. ? Food that is high in saturated fat, such as fatty meat. ? Sweets,  desserts, sugary drinks, and other foods with added sugar. ? Full-fat dairy products.  Do not salt foods before eating.  Try to eat at least 2 vegetarian meals each week.  Eat more home-cooked food and less restaurant, buffet, and fast food.  When eating at a restaurant, ask that your food be prepared with less salt or no salt, if possible. What foods are recommended? The items listed may not be a complete list. Talk with your dietitian about what dietary choices are best for you. Grains Whole-grain or whole-wheat bread. Whole-grain or whole-wheat pasta. Brown rice. Modena Morrow.  Bulgur. Whole-grain and low-sodium cereals. Pita bread. Low-fat, low-sodium crackers. Whole-wheat flour tortillas. Vegetables Fresh or frozen vegetables (raw, steamed, roasted, or grilled). Low-sodium or reduced-sodium tomato and vegetable juice. Low-sodium or reduced-sodium tomato sauce and tomato paste. Low-sodium or reduced-sodium canned vegetables. Fruits All fresh, dried, or frozen fruit. Canned fruit in natural juice (without added sugar). Meat and other protein foods Skinless chicken or [place]. Ground chicken or [place]. Pork with fat trimmed off. Fish and seafood. Egg whites. Dried beans, peas, or lentils. Unsalted nuts, nut butters, and seeds. Unsalted canned beans. Lean cuts of beef with fat trimmed off. Low-sodium, lean deli meat. Dairy Low-fat (1%) or fat-free (skim) milk. Fat-free, low-fat, or reduced-fat cheeses. Nonfat, low-sodium ricotta or cottage cheese. Low-fat or nonfat yogurt. Low-fat, low-sodium cheese. Fats and oils Soft margarine without trans fats. Vegetable oil. Low-fat, reduced-fat, or light mayonnaise and salad dressings (reduced-sodium). Canola, safflower, olive, soybean, and sunflower oils. Avocado. Seasoning and other foods Herbs. Spices. Seasoning mixes without salt. Unsalted popcorn and pretzels. Fat-free sweets. What foods are not recommended? The items listed may not be a complete list. Talk with your dietitian about what dietary choices are best for you. Grains Baked goods made with fat, such as croissants, muffins, or some breads. Dry pasta or rice meal packs. Vegetables Creamed or fried vegetables. Vegetables in a cheese sauce. Regular canned vegetables (not low-sodium or reduced-sodium). Regular canned tomato sauce and paste (not low-sodium or reduced-sodium). Regular tomato and vegetable juice (not low-sodium or reduced-sodium). Angie Fava. Olives. Fruits Canned fruit in a light or heavy syrup. Fried fruit. Fruit in cream or butter sauce. Meat and other  protein foods Fatty cuts of meat. Ribs. Fried meat. Berniece Salines. Sausage. Bologna and other processed lunch meats. Salami. Fatback. Hotdogs. Bratwurst. Salted nuts and seeds. Canned beans with added salt. Canned or smoked fish. Whole eggs or egg yolks. Chicken or [place] with skin. Dairy Whole or 2% milk, cream, and half-and-half. Whole or full-fat cream cheese. Whole-fat or sweetened yogurt. Full-fat cheese. Nondairy creamers. Whipped toppings. Processed cheese and cheese spreads. Fats and oils Butter. Stick margarine. Lard. Shortening. Ghee. Bacon fat. Tropical oils, such as coconut, palm kernel, or palm oil. Seasoning and other foods Salted popcorn and pretzels. Onion salt, garlic salt, seasoned salt, table salt, and sea salt. Worcestershire sauce. Tartar sauce. Barbecue sauce. Teriyaki sauce. Soy sauce, including reduced-sodium. Steak sauce. Canned and packaged gravies. Fish sauce. Oyster sauce. Cocktail sauce. Horseradish that you find on the shelf. Ketchup. Mustard. Meat flavorings and tenderizers. Bouillon cubes. Hot sauce and Tabasco sauce. Premade or packaged marinades. Premade or packaged taco seasonings. Relishes. Regular salad dressings. Where to find more information:  National Heart, Lung, and [name]: https://wilson-eaton.com/  American Heart Association: www.heart.org Summary  The DASH eating plan is a healthy eating plan that has been shown to reduce high blood pressure (hypertension). It may also reduce your risk for type 2 diabetes, heart  disease, and stroke.  With the DASH eating plan, you should limit salt (sodium) intake to 2,300 mg a day. If you have hypertension, you may need to reduce your sodium intake to 1,500 mg a day.  When on the DASH eating plan, aim to eat more fresh fruits and vegetables, whole grains, lean proteins, low-fat dairy, and heart-healthy fats.  Work with your health care provider or diet and nutrition specialist (dietitian) to adjust your eating plan to your  individual calorie needs. This information is not intended to replace advice given to you by your health care provider. Make sure you discuss any questions you have with your health care provider. Document Revised: 05/29/2017 Document Reviewed: 06/09/2016 Elsevier Patient Education  2020 [employer].

## 2019-12-16 ENCOUNTER — Other Ambulatory Visit: Payer: Self-pay

## 2019-12-19 ENCOUNTER — Other Ambulatory Visit: Payer: Self-pay

## 2019-12-19 ENCOUNTER — Other Ambulatory Visit (INDEPENDENT_AMBULATORY_CARE_PROVIDER_SITE_OTHER): Payer: Medicare HMO

## 2019-12-19 DIAGNOSIS — N179 Acute kidney failure, unspecified: Secondary | ICD-10-CM

## 2019-12-19 LAB — BASIC METABOLIC PANEL
BUN: 20 mg/dL (ref 6–23)
CO2: 27 mEq/L (ref 19–32)
Calcium: 9.1 mg/dL (ref 8.4–10.5)
Chloride: 102 mEq/L (ref 96–112)
Creatinine, Ser: 1.01 mg/dL (ref 0.40–1.20)
GFR: 64.16 mL/min (ref >60.00)
Glucose, Bld: 244 mg/dL — ABNORMAL HIGH (ref 70–99)
Potassium: 3.7 mEq/L (ref 3.5–5.1)
Sodium: 138 mEq/L (ref 135–145)

## 2019-12-20 DIAGNOSIS — J301 Allergic rhinitis due to pollen: Secondary | ICD-10-CM | POA: Diagnosis not present

## 2019-12-20 DIAGNOSIS — R05 Cough: Secondary | ICD-10-CM | POA: Diagnosis not present

## 2019-12-20 DIAGNOSIS — R062 Wheezing: Secondary | ICD-10-CM | POA: Diagnosis not present

## 2019-12-21 ENCOUNTER — Encounter: Payer: Self-pay | Admitting: Podiatry

## 2019-12-21 ENCOUNTER — Other Ambulatory Visit: Payer: Self-pay

## 2019-12-21 ENCOUNTER — Ambulatory Visit: Payer: Medicare HMO | Admitting: Podiatry

## 2019-12-21 DIAGNOSIS — E1129 Type 2 diabetes mellitus with other diabetic kidney complication: Secondary | ICD-10-CM | POA: Diagnosis not present

## 2019-12-21 DIAGNOSIS — B351 Tinea unguium: Secondary | ICD-10-CM | POA: Diagnosis not present

## 2019-12-21 DIAGNOSIS — M79675 Pain in left toe(s): Secondary | ICD-10-CM | POA: Diagnosis not present

## 2019-12-21 DIAGNOSIS — M79674 Pain in right toe(s): Secondary | ICD-10-CM | POA: Diagnosis not present

## 2019-12-21 DIAGNOSIS — M2041 Other hammer toe(s) (acquired), right foot: Secondary | ICD-10-CM | POA: Diagnosis not present

## 2019-12-21 DIAGNOSIS — M2042 Other hammer toe(s) (acquired), left foot: Secondary | ICD-10-CM

## 2019-12-21 NOTE — Progress Notes (Signed)
.     Subjective:  Patient ID: [name], female    DOB: 01-18-1941,  MRN: [id number]  Chief Complaint  Patient presents with  . Nail Problem    pt is here for a nail fungus, as wserll as a possible nail trim   79 y.o. female returns for the above complaint.  Patient presents with thickened elongated dystrophic toenails x10.  Patient is a type II diabetic with last A1c of 6.8.  Patient states that she would like to have the nails debrided down as she has not been able to do it herself.  They are painful to touch.  She states is painful when ambulating.  She is also interested in getting diabetic shoes as well.  She denies any other acute complaints.  Objective:  There were no vitals filed for this visit. Podiatric Exam: Vascular: dorsalis pedis and posterior tibial pulses are palpable bilateral. Capillary return is immediate. Temperature gradient is WNL. Skin turgor WNL  Sensorium: Normal Semmes Weinstein monofilament test. Normal tactile sensation bilaterally. Nail Exam: Pt has thick disfigured discolored nails with subungual debris noted bilateral entire nail hallux through fifth toenails.  Pain on palpation to the nails. Ulcer Exam: There is no evidence of ulcer or pre-ulcerative changes or infection. Orthopedic Exam: Muscle tone and strength are WNL. No limitations in general ROM. No crepitus or effusions noted. HAV  B/L.  Hammer toes 2-5  B/L. Skin: No Porokeratosis. No infection or ulcers    Assessment & Plan:   1. Hammertoe, bilateral   2. Pain due to onychomycosis of toenails of both feet   3. 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  Hammertoe contractures bilaterally 2 through 5 -I explained to the patient the etiology of hammertoe contractures and various treatment options were discussed.  Given that patient does have these  contractures with the setting of diabetes patient is predisposed to developing ulcerations.  I believe she will benefit from diabetic shoes. -She will be scheduled see Liliane Channel for diabetic shoes.   Onychomycosis with pain  -Nails palliatively debrided as below. -Educated on self-care  Procedure: Nail Debridement Rationale: pain  Type of Debridement: manual, sharp debridement. Instrumentation: Nail nipper, rotary burr. Number of Nails: 10  Procedures and Treatment: Consent by patient was obtained for treatment procedures. The patient understood the discussion of treatment and procedures well. All questions were answered thoroughly reviewed. Debridement of mycotic and hypertrophic toenails, 1 through 5 bilateral and clearing of subungual debris. No ulceration, no infection noted.  Return Visit-Office Procedure: Patient instructed to return to the office for a follow up visit 3 months for continued evaluation and treatment.  Boneta Lucks, DPM    No follow-ups on file.

## 2019-12-23 ENCOUNTER — Telehealth: Payer: Self-pay | Admitting: Family Medicine

## 2019-12-23 ENCOUNTER — Other Ambulatory Visit: Payer: Self-pay | Admitting: Family Medicine

## 2019-12-23 DIAGNOSIS — I10 Essential (primary) hypertension: Secondary | ICD-10-CM | POA: Diagnosis not present

## 2019-12-23 MED ORDER — BENAZEPRIL HCL 40 MG PO TABS
40.0000 mg | ORAL_TABLET | Freq: Every day | ORAL | 1 refills | Status: DC
Start: 2019-12-23 — End: 2020-06-28

## 2019-12-23 NOTE — Telephone Encounter (Signed)
I went ahead and sent in benazepril 40 mg to her pharmacy.  If she wants [place] to send this to mail order please forward to them.

## 2019-12-23 NOTE — Telephone Encounter (Signed)
Pt stated she will be taking 2 of her benazepril starting this weekend.

## 2019-12-23 NOTE — Telephone Encounter (Signed)
error 

## 2019-12-26 NOTE — Telephone Encounter (Signed)
Patient informed of the message below.

## 2019-12-27 DIAGNOSIS — J301 Allergic rhinitis due to pollen: Secondary | ICD-10-CM | POA: Diagnosis not present

## 2019-12-27 DIAGNOSIS — J3089 Other allergic rhinitis: Secondary | ICD-10-CM | POA: Diagnosis not present

## 2019-12-30 ENCOUNTER — Ambulatory Visit (HOSPITAL_COMMUNITY)
Admission: EM | Admit: 2019-12-30 | Discharge: 2019-12-30 | Disposition: A | Discharge status: Home / Self Care | Payer: Medicare HMO | Attending: Family Medicine | Admitting: Family Medicine

## 2019-12-30 ENCOUNTER — Other Ambulatory Visit: Payer: Self-pay

## 2019-12-30 ENCOUNTER — Encounter (HOSPITAL_COMMUNITY): Payer: Self-pay

## 2019-12-30 DIAGNOSIS — I10 Essential (primary) hypertension: Secondary | ICD-10-CM

## 2019-12-30 DIAGNOSIS — J3089 Other allergic rhinitis: Secondary | ICD-10-CM | POA: Diagnosis not present

## 2019-12-30 DIAGNOSIS — J301 Allergic rhinitis due to pollen: Secondary | ICD-10-CM | POA: Diagnosis not present

## 2019-12-30 NOTE — ED Provider Notes (Signed)
[place]    CSN: [id number] Arrival date & time: 12/30/19  1003      History   Chief Complaint Chief Complaint  Patient presents with   Hypertension    HPI Doris Jackson is a 79 y.o. female.   HPI  Patient is here for blood pressure check She is having no headaches, dizzy spells, visual symptoms, chest pain or shortness of breath She is worried because her doctor has been adjusting her blood pressure medication Her blood pressure is going up and down She tells me that last night it was up to 160/100, and then 5 minutes later it was down to 1 4488 She worries that her blood pressure fluctuations are dangerous  Past Medical History:  Diagnosis Date   ALLERGIC RHINITIS 06/17/2010   ANEMIA-NOS 06/17/2010   BREAST CANCER, HX OF 06/17/2010   Cancer ([place]) 2008   right breast   Cataract    COPD 06/17/2010   DIABETES MELLITUS, TYPE II 06/17/2010   Endometriosis    Fibroid    FIBROID   GERD (gastroesophageal reflux disease)    HYPERLIPIDEMIA 06/17/2010   HYPERTENSION 06/17/2010   Leg swelling    LOW BACK PAIN 06/17/2010   OSTEOARTHRITIS 06/17/2010   OSTEOPENIA 06/17/2010   Weakness     Patient Active Problem List   Diagnosis Date Noted   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   Anxiety state    Acute blood loss anemia    Postoperative pain    Cervical myelopathy ([place]) 09/22/2019   S/P cervical spinal fusion 09/22/2019   Cervical spondylosis with myelopathy and radiculopathy 09/15/2019   AKI (acute kidney injury) ([place]) 09/14/2019   GERD (gastroesophageal reflux disease) 09/14/2019   Spinal stenosis in cervical region 09/14/2019   Weakness 09/14/2019   Numbness and tingling 09/14/2019   Cervical spinal stenosis 09/14/2019   Lumbar stenosis 08/24/2019   Obesity, morbid, BMI 40.0-49.9 ([place]) 04/19/2018   Cataract    Fibroid    Endometriosis    DM (diabetes mellitus) type II controlled with  renal manifestation ([place]) 06/17/2010   Dyslipidemia 06/17/2010   Microcytic anemia 06/17/2010   Essential hypertension 06/17/2010   Allergic rhinitis 06/17/2010   Osteoarthritis 06/17/2010   LOW BACK PAIN 06/17/2010   OSTEOPENIA 06/17/2010   BREAST CANCER, HX OF 06/17/2010    Past Surgical History:  Procedure Laterality Date   ABDOMINAL HYSTERECTOMY  1988   TAH,LSO   ANTERIOR CERVICAL DECOMP/DISCECTOMY FUSION N/A 09/15/2019   Procedure: Cervical five CorpectomyANTERIOR CERVICAL DECOMPRESSION/DISCECTOMY FUSION CERVICAL THREE-CERVICAL Four;  Surgeon: Ashok Pall, MD;  Location: [facility];  Service: Neurosurgery;  Laterality: N/A;   BREAST LUMPECTOMY Right 2007   LUMPECTOMY FOLLOWED BY RADIATION   COLONOSCOPY     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 Authorizing Provider  acetaminophen (TYLENOL) 500 MG tablet Take 500 mg by mouth every 6 (six) hours as needed for mild pain.    [provider]  amLODipine (NORVASC) 10 MG tablet Take 1 tablet (10 mg total) by mouth daily. 11/10/19   Caren Macadam, MD  Ascorbic Acid (VITAMIN C) 1000 MG tablet Take 1,000 mg by mouth daily.     [provider]  benazepril (LOTENSIN) 40 MG tablet Take 1 tablet (40 mg total) by mouth daily. 12/23/19   Caren Macadam, MD  Cholecalciferol (VITAMIN D) 2000 UNITS CAPS Take  4,000 Units by mouth daily.     [provider]  clotrimazole-betamethasone (LOTRISONE) cream APPLY TO AFFECTED AREA TWICE A DAY 11/30/19   Koberlein, Steele Berg, MD  cyanocobalamin 2000 MCG tablet Take 2,000 mcg by mouth daily.      [provider]  DULoxetine (CYMBALTA) 20 MG capsule Take 1 capsule (20 mg total) by mouth at bedtime. 11/30/19   Caren Macadam, MD  EPINEPHrine 0.3 mg/0.3 mL IJ SOAJ injection SMARTSIG:0.3 Milliliter(s) IM Once PRN 12/09/19   [provider]  FERREX 150 150 MG capsule TAKE 1 CAPSULE BY MOUTH EVERY DAY 11/01/19   Koberlein, Steele Berg, MD  fexofenadine (ALLEGRA) 180 MG tablet Take 180 mg by mouth daily as needed for allergies.     [provider]  furosemide (LASIX) 20 MG tablet Take 1 tablet (20 mg total) by mouth daily as needed. 11/16/19   Koberlein, Steele Berg, MD  glipiZIDE (GLUCOTROL XL) 5 MG 24 hr tablet TAKE 1 TABLET BY MOUTH EVERY DAY Patient taking differently: Take 5 mg by mouth daily.  06/07/19   Isaac Bliss, Rayford Halsted, MD  Lancets 30G MISC USE TWICE DAILY AS DIRECTED FOR GLUCOSE TESTING AND MONITORING 01/19/14   Marletta Lor, MD  metFORMIN (GLUCOPHAGE-XR) 500 MG 24 hr tablet Take 500 mg by mouth 2 (two) times daily. 10/07/19   [provider]  metoprolol succinate (TOPROL-XL) 50 MG 24 hr tablet Take 1 tablet (50 mg total) by mouth daily. Take with or immediately following a meal. 09/23/19   Donne Beila, MD  Multiple Vitamin (MULTIVITAMIN WITH MINERALS) TABS tablet Take 1 tablet by mouth daily. 10/07/19   Love, Ivan Anchors, PA-C  omeprazole (PRILOSEC) 40 MG capsule Take 1 capsule (40 mg total) by mouth at bedtime. 11/30/19   Caren Macadam, MD  ONE TOUCH LANCETS MISC Test twice daily. 08/01/14   Marletta Lor, MD  [facility] VERIO test strip TEST TWICE A DAY. DX E11.9 11/23/19   Koberlein, Andris Flurry C, MD  pioglitazone (ACTOS) 45 MG tablet TAKE 1 TABLET BY MOUTH EVERY DAY Patient taking differently: Take 45 mg by  mouth daily.  10/12/19   Caren Macadam, MD  potassium chloride (MICRO-K) 10 MEQ CR capsule Take 1 capsule BID x 3 days, then take with use of lasix 11/16/19   Koberlein, Steele Berg, MD  pravastatin (PRAVACHOL) 40 MG tablet Take 1 tablet (40 mg total) by mouth daily. Patient taking differently: Take 40 mg by mouth 2 (two) times a week.  02/25/18   Marletta Lor, MD  prednisoLONE acetate (PRED FORTE) 1 % ophthalmic suspension Place 1 drop into both eyes See admin instructions.  Place 1 drop into left eye three times daily Place 1 drop into right eye once daily 09/14/19   [provider]  Probiotic Product (PROBIOTIC-10 PO) Take 1 capsule by mouth daily.     [provider]  senna-docusate (SENOKOT-S) 8.6-50 MG tablet Take 1 tablet by mouth at bedtime. Patient taking differently: Take 1 tablet by mouth at bedtime as needed for mild constipation.  10/06/19   Love, Ivan Anchors, PA-C  sitaGLIPtin (JANUVIA) 100 MG tablet Take 1 tablet (100 mg total) by mouth daily. 10/06/19   Love, Ivan Anchors, PA-C    Family History Family History  Problem Relation Age of Onset   Diabetes Mother    Hypertension Sister    Asthma Father    Diabetes Brother     Social History Social History   Tobacco Use   Smoking status: Never Smoker   Smokeless tobacco: Never Used  Substance Use Topics   Alcohol use: No   Drug use: No     Allergies   Dust mite extract   Review of Systems Review of Systems See HPI Patient denies symptoms  Physical Exam Triage Vital Signs ED Triage Vitals  Enc Vitals Group     BP 12/30/19 1015 (!) 155/94     Pulse Rate 12/30/19 1015 87     Resp 12/30/19 1015 16     Temp 12/30/19 1015 97.8 F (36.6 C)     Temp Source 12/30/19 1015 Oral     SpO2 12/30/19 1015 99 %     Weight 12/30/19 1016 215 lb (97.5 kg)     Height 12/30/19 1016 5\' 2"  (1.575 m)     Head Circumference --      Peak Flow --      Pain Score 12/30/19 1016 0     Pain Loc --      Pain  Edu? --      Excl. in [place]? --    No data found.  Updated Vital Signs BP (!) 160/81 (BP Location: Left Arm)    Pulse 87    Temp 97.8 F (36.6 C) (Oral)    Resp 16    Ht 5\' 2"  (1.575 m)    Wt 97.5 kg    SpO2 99%    BMI 39.32 kg/m      Physical Exam Constitutional:      General: She is not in acute distress.    Appearance: She is well-developed. She is obese.  HENT:     Head: Normocephalic and atraumatic.     Mouth/Throat:     Comments: Mask is in place Eyes:     Conjunctiva/sclera: Conjunctivae normal.     Pupils: Pupils are equal, round, and reactive to light.  Cardiovascular:     Rate and Rhythm: Normal rate and regular rhythm.     Heart sounds: Normal heart sounds.  Pulmonary:     Effort: Pulmonary effort is normal. No respiratory distress.     Breath sounds: Normal breath sounds.  Abdominal:     General: There is no distension.     Palpations: Abdomen is soft.  Musculoskeletal:        General: Normal range of motion.     Cervical back: Normal range of motion.     Right lower leg: Edema present.     Left lower leg: Edema present.     Comments: Significant edema both ankles.  Compression stockings in place  Skin:    General: Skin is warm and dry.  Neurological:     General: No focal deficit present.     Mental Status: She is alert.  Psychiatric:        Mood and Affect: Mood normal.        Behavior: Behavior normal.   Ambulates with walker  UC Treatments / Results  Labs (all labs ordered are listed, but only abnormal results are displayed) Labs Reviewed - No data to display  EKG   Radiology No results found.  Procedures Procedures (including critical care time)  Medications Ordered in UC Medications - No data to display  Initial Impression / Assessment and Plan / UC Course  I have reviewed the triage vital signs and the nursing notes.  Pertinent labs & imaging results that were available during my care of the patient were reviewed by me and considered  in my medical decision making (see chart for details).     I discussed hypertension with patient.  Fluctuations are normal.  It sounds like her baseline blood pressure is reasonably well controlled.  She is under the care of her primary care doctor with regular follow-up.  She is taking her blood pressure at home.  Concerns were addressed. Final Clinical Impressions(s) / UC Diagnoses   Final diagnoses:  Essential hypertension     Discharge Instructions     See your primary care doctor as scheduled July 14. Your blood pressure is going up and down a bit , but not to a dangerous level.  Some variation in BP is normal.   Stay on your current medication Take your blood pressure one to two times a day, RESTING for 5 minutes,  and write down for your doctor Resist the urge to take over and over again unless you feel bad  Resting blood pressure today is:  160/81   ED Prescriptions    None     PDMP not reviewed this encounter.   Raylene Everts, MD 01/01/20 [phone]

## 2019-12-30 NOTE — ED Triage Notes (Signed)
Pt c/o HTN and states it's been up and down the past 2-3 wks and her PCP has been adjusting her bp meds. Pt states her bp was 161/100 last night. After 5 mins bp was 144/88.

## 2019-12-30 NOTE — Discharge Instructions (Addendum)
See your primary care doctor as scheduled July 14. Your blood pressure is going up and down a bit , but not to a dangerous level.  Some variation in BP is normal.   Stay on your current medication Take your blood pressure one to two times a day, RESTING for 5 minutes,  and write down for your doctor Resist the urge to take over and over again unless you feel bad  Resting blood pressure today is:  160/81

## 2020-01-03 ENCOUNTER — Ambulatory Visit (INDEPENDENT_AMBULATORY_CARE_PROVIDER_SITE_OTHER): Payer: Medicare HMO | Admitting: Family Medicine

## 2020-01-03 ENCOUNTER — Other Ambulatory Visit: Payer: Self-pay

## 2020-01-03 ENCOUNTER — Encounter: Payer: Self-pay | Admitting: Family Medicine

## 2020-01-03 VITALS — BP 128/76 | HR 91 | Temp 98.1°F | Ht 62.0 in | Wt 215.0 lb

## 2020-01-03 DIAGNOSIS — J3089 Other allergic rhinitis: Secondary | ICD-10-CM | POA: Diagnosis not present

## 2020-01-03 DIAGNOSIS — I10 Essential (primary) hypertension: Secondary | ICD-10-CM | POA: Diagnosis not present

## 2020-01-03 DIAGNOSIS — J301 Allergic rhinitis due to pollen: Secondary | ICD-10-CM | POA: Diagnosis not present

## 2020-01-03 NOTE — Progress Notes (Signed)
° °  Subjective:    Patient ID: Doris Jackson, female    DOB: 01-15-1941, 79 y.o.   MRN: [id number]  HPI Here to follow up an ER visit on 12-30-19 for BP fluctuations. She had been getting BP readings as high as the 865H systolic at home and this concerned here. She denies having any symptoms from this however, no headache or chest pain or SOB. A the ER her BP was 1155/94. Her exam and tests were otherwise unremarkable. They did not change her medications and told her to follow up with [place]. Since then she has felt fine, but she still has some fluctuations where her BP goes up to the high 155s over 90s. This makes her nervous when that happens.    Review of Systems  Constitutional: Negative.   Respiratory: Negative.   Cardiovascular: Positive for leg swelling. Negative for chest pain and palpitations.       Objective:   Physical Exam Constitutional:      Appearance: She is not ill-appearing.     Comments: Uses a walker   Cardiovascular:     Rate and Rhythm: Normal rate and regular rhythm.     Pulses: Normal pulses.     Heart sounds: Normal heart sounds.  Pulmonary:     Effort: Pulmonary effort is normal.     Breath sounds: Normal breath sounds.  Musculoskeletal:     Comments: 2+ edema in both ankles   Neurological:     Mental Status: She is alert.           Assessment & Plan:  HTN, we agreed to increase the Metoprolol succinate to 2 tablets (100 mg total) daily. She will follow up with Dr. Ethlyn Gallery next week as scheduled.  Alysia Penna, MD

## 2020-01-04 ENCOUNTER — Ambulatory Visit: Payer: Medicare HMO | Admitting: Family Medicine

## 2020-01-05 DIAGNOSIS — J3089 Other allergic rhinitis: Secondary | ICD-10-CM | POA: Diagnosis not present

## 2020-01-05 DIAGNOSIS — J301 Allergic rhinitis due to pollen: Secondary | ICD-10-CM | POA: Diagnosis not present

## 2020-01-09 DIAGNOSIS — I10 Essential (primary) hypertension: Secondary | ICD-10-CM | POA: Diagnosis not present

## 2020-01-09 DIAGNOSIS — Z6839 Body mass index (BMI) 39.0-39.9, adult: Secondary | ICD-10-CM | POA: Diagnosis not present

## 2020-01-09 DIAGNOSIS — T84216A Breakdown (mechanical) of internal fixation device of vertebrae, initial encounter: Secondary | ICD-10-CM | POA: Diagnosis not present

## 2020-01-11 ENCOUNTER — Encounter: Payer: Self-pay | Admitting: Family Medicine

## 2020-01-11 ENCOUNTER — Other Ambulatory Visit: Payer: Self-pay

## 2020-01-11 ENCOUNTER — Telehealth (INDEPENDENT_AMBULATORY_CARE_PROVIDER_SITE_OTHER): Payer: Medicare HMO | Admitting: Family Medicine

## 2020-01-11 VITALS — BP 134/64 | HR 72 | Temp 98.3°F | Ht 62.0 in | Wt 218.6 lb

## 2020-01-11 DIAGNOSIS — R490 Dysphonia: Secondary | ICD-10-CM

## 2020-01-11 DIAGNOSIS — D649 Anemia, unspecified: Secondary | ICD-10-CM

## 2020-01-11 DIAGNOSIS — E559 Vitamin D deficiency, unspecified: Secondary | ICD-10-CM

## 2020-01-11 DIAGNOSIS — E538 Deficiency of other specified B group vitamins: Secondary | ICD-10-CM | POA: Diagnosis not present

## 2020-01-11 DIAGNOSIS — E785 Hyperlipidemia, unspecified: Secondary | ICD-10-CM

## 2020-01-11 DIAGNOSIS — J3089 Other allergic rhinitis: Secondary | ICD-10-CM | POA: Diagnosis not present

## 2020-01-11 DIAGNOSIS — I10 Essential (primary) hypertension: Secondary | ICD-10-CM | POA: Diagnosis not present

## 2020-01-11 DIAGNOSIS — E1129 Type 2 diabetes mellitus with other diabetic kidney complication: Secondary | ICD-10-CM

## 2020-01-11 DIAGNOSIS — J301 Allergic rhinitis due to pollen: Secondary | ICD-10-CM | POA: Diagnosis not present

## 2020-01-11 MED ORDER — JANUMET XR 100-1000 MG PO TB24: 1.0000 | ORAL_TABLET | Freq: Every day | ORAL | 1 refills | Status: DC

## 2020-01-11 NOTE — Patient Instructions (Addendum)
We are going to try to combine the metformin and Januvia into 1 pill. You can stop the individual pills and start this combination pill (if insurance covers).   Start taking amlodipine at night. Keep taking rest of blood pressure medicines in the morning. Check blood pressures 1 hour after morning medications. Make sure to record your heart rate as well. Call me if your blood pressures are 943 or higher systolic on more than one occasion.

## 2020-01-11 NOTE — Progress Notes (Signed)
Doris Jackson DOB: 1941-04-24 Encounter date: 01/11/2020  This is a 79 y.o. female who presents with Chief Complaint  Patient presents with  . Follow-up    History of present illness: Saw Dr. Sarajane Jews on 7/6 for HTN - increased metoprolol to 100mg  daily. Was in ER prior for bp fluctuations.  She did not increase his medication if she wanted to wait and talk with me first.  Saw surgeon on Monday and pressure was 161/77. Usually at home running 150's/80-90. Machine is close to ours. Feels pretty good overall. Really eating healthy overall - notes increase in bp when she "cheats" - eats more sodium.   Currnetly on amlodipine 10mg , benazepril 40mg , metoprolol 100mg  Was on hctz in past, which was discontinued in April due to AKI during hospitalization related to cervical cord compression.   Blood sugar this morning was 149. Does check regularly.   Bowels have been more constipated. Working on more greens to help with this. Has been able to stop probiotic.   She is getting stronger daily.  She is doing more walking with her walker.  She is up and able to get around a little bit easier.  Allergies  Allergen Reactions  . Dust Mite Extract Itching and Cough   Current Meds  Medication Sig  . acetaminophen (TYLENOL) 500 MG tablet Take 500 mg by mouth every 6 (six) hours as needed for mild pain.  Marland Kitchen amLODipine (NORVASC) 10 MG tablet Take 1 tablet (10 mg total) by mouth daily.  . Ascorbic Acid (VITAMIN C) 1000 MG tablet Take 1,000 mg by mouth daily.   . benazepril (LOTENSIN) 40 MG tablet Take 1 tablet (40 mg total) by mouth daily.  . Cholecalciferol (VITAMIN D) 2000 UNITS CAPS Take 4,000 Units by mouth daily.   . clotrimazole-betamethasone (LOTRISONE) cream APPLY TO AFFECTED AREA TWICE A DAY  . cyanocobalamin 2000 MCG tablet Take 2,000 mcg by mouth daily.    . DULoxetine (CYMBALTA) 20 MG capsule Take 1 capsule (20 mg total) by mouth at bedtime.  Marland Kitchen EPINEPHrine 0.3 mg/0.3 mL IJ SOAJ injection  SMARTSIG:0.3 Milliliter(s) IM Once PRN  . FERREX 150 150 MG capsule TAKE 1 CAPSULE BY MOUTH EVERY DAY  . fexofenadine (ALLEGRA) 180 MG tablet Take 180 mg by mouth daily as needed for allergies.   . furosemide (LASIX) 20 MG tablet Take 1 tablet (20 mg total) by mouth daily as needed.  Marland Kitchen glipiZIDE (GLUCOTROL XL) 5 MG 24 hr tablet TAKE 1 TABLET BY MOUTH EVERY DAY (Patient taking differently: Take 5 mg by mouth daily. )  . Lancets 30G MISC USE TWICE DAILY AS DIRECTED FOR GLUCOSE TESTING AND MONITORING  . metoprolol succinate (TOPROL-XL) 50 MG 24 hr tablet Take 1 tablet (50 mg total) by mouth daily. Take with or immediately following a meal.  . Multiple Vitamin (MULTIVITAMIN WITH MINERALS) TABS tablet Take 1 tablet by mouth daily.  Marland Kitchen omeprazole (PRILOSEC) 40 MG capsule Take 1 capsule (40 mg total) by mouth at bedtime.  Marland Kitchen ONE TOUCH LANCETS MISC Test twice daily.  [facility] VERIO test strip TEST TWICE A DAY. DX E11.9  . pioglitazone (ACTOS) 45 MG tablet TAKE 1 TABLET BY MOUTH EVERY DAY (Patient taking differently: Take 45 mg by mouth daily. )  . potassium chloride (MICRO-K) 10 MEQ CR capsule Take 1 capsule BID x 3 days, then take with use of lasix  . pravastatin (PRAVACHOL) 40 MG tablet Take 1 tablet (40 mg total) by mouth daily. (Patient taking differently: Take  40 mg by mouth 2 (two) times a week. )  . prednisoLONE acetate (PRED FORTE) 1 % ophthalmic suspension Place 1 drop into both eyes See admin instructions. Place 1 drop into left eye three times daily Place 1 drop into right eye once daily  . Probiotic Product (PROBIOTIC-10 PO) Take 1 capsule by mouth daily.   Marland Kitchen senna-docusate (SENOKOT-S) 8.6-50 MG tablet Take 1 tablet by mouth at bedtime. (Patient taking differently: Take 1 tablet by mouth at bedtime as needed for mild constipation. )  . [DISCONTINUED] metFORMIN (GLUCOPHAGE-XR) 500 MG 24 hr tablet Take 500 mg by mouth 2 (two) times daily.  . [DISCONTINUED] sitaGLIPtin (JANUVIA) 100 MG tablet Take  1 tablet (100 mg total) by mouth daily.    Review of Systems  Constitutional: Negative for chills, fatigue and fever.  Respiratory: Negative for cough, chest tightness, shortness of breath and wheezing.   Cardiovascular: Negative for chest pain, palpitations and leg swelling.    Objective:  BP 134/64   Pulse 72   Temp 98.3 F (36.8 C) (Oral)   Ht 5\' 2"  (1.575 m)   Wt 218 lb 9.6 oz (99.2 kg)   SpO2 97%   BMI 39.98 kg/m   Weight: 218 lb 9.6 oz (99.2 kg)   BP Readings from Last 3 Encounters:  01/11/20 134/64  01/03/20 128/76  12/30/19 (!) 160/81   Wt Readings from Last 3 Encounters:  01/11/20 218 lb 9.6 oz (99.2 kg)  01/03/20 215 lb (97.5 kg)  12/30/19 215 lb (97.5 kg)    Physical Exam Constitutional:      General: She is not in acute distress.    Appearance: She is well-developed.  HENT:     Mouth/Throat:     Comments: Voice is hoarse Cardiovascular:     Rate and Rhythm: Normal rate and regular rhythm.     Heart sounds: Normal heart sounds. No murmur heard.  No friction rub.  Pulmonary:     Effort: Pulmonary effort is normal. No respiratory distress.     Breath sounds: Normal breath sounds. No wheezing or rales.  Musculoskeletal:     Right lower leg: No edema.     Left lower leg: No edema.  Neurological:     Mental Status: She is alert and oriented to person, place, and time.  Psychiatric:        Behavior: Behavior normal.     Assessment/Plan  1. Essential hypertension Blood pressure is really looking quite good today.  I do not want for her to increase her metoprolol due to numbers today due to risk of hypotension.  I am going to switch around her medications and have her start taking her amlodipine at night and continue the rest of her blood pressure medications in the morning.  I am going to have her check her blood pressures 1 hour after morning medications.  I have asked her to record her heart rate as well.  I will have her call me if blood pressures are  higher than 950 systolic on more than one occasion.  Of note, she was on hydrochlorothiazide in the past, and I would consider restarting this now that kidney function is back to baseline if blood pressure elevates. - CBC with Differential/Platelet; Future  2. Dyslipidemia Continue with pravastatin. - Lipid panel; Future  3. 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  She is taking a lot of medications.  We are going to try to combine what we can to make medicine management easier.  Going to start with combining the Metformin and Janumet.  If insurance covers this, we will see how she does with this change and continue to put her on combination medications as long as these are affordable for her. - Hemoglobin A1c; Future - Microalbumin / creatinine urine ratio; Future - Comprehensive metabolic panel; Future  4. Vitamin B12 deficiency Continue with supplementation.  We will recheck for levels. - Vitamin B12; Future  5. Anemia, unspecified type Recheck for baseline levels. - Ferritin; Future  6. Vitamin D deficiency Rechecking levels. - VITAMIN D 25 Hydroxy (Vit-D Deficiency, Fractures); Future  7. Hoarse voice quality Hoarse voice does not seem to be improving even after she is recovering from surgery.  Patient states that surgeon suggested consideration for ENT follow-up, so referral will be placed today. - Ambulatory referral to ENT    Return for pending bloodwork. Time spent in room with patient, reviewing blood pressures, reviewing when to take medications, reviewing what medications are for, reviewing dietary effects on blood pressure, charting time 40 minutes.   [name], MD

## 2020-01-19 DIAGNOSIS — Z947 Corneal transplant status: Secondary | ICD-10-CM | POA: Diagnosis not present

## 2020-01-19 DIAGNOSIS — H18519 Endothelial corneal dystrophy, unspecified eye: Secondary | ICD-10-CM | POA: Diagnosis not present

## 2020-01-23 ENCOUNTER — Telehealth: Payer: Self-pay | Admitting: Family Medicine

## 2020-01-23 DIAGNOSIS — K219 Gastro-esophageal reflux disease without esophagitis: Secondary | ICD-10-CM

## 2020-01-23 NOTE — Telephone Encounter (Signed)
Pt call and want a referral to [facility] for her acid reflux pt  call [facility] and they ask that the referral be fax to [phone] and this is the ph #[phone].

## 2020-01-24 ENCOUNTER — Encounter: Payer: Self-pay | Admitting: Family Medicine

## 2020-01-24 ENCOUNTER — Telehealth: Payer: Medicare HMO | Admitting: Family Medicine

## 2020-01-24 NOTE — Progress Notes (Signed)
Called pt as there was no reply when sent link for video visit. PT and caregiver report they no longer need virtual visit as they are going to the [facility] ER for her symptoms. Declined phone or video visit. Michela Pitcher they would call back if needed anything further after the ER visit.

## 2020-01-24 NOTE — Telephone Encounter (Signed)
Spoke with the pt and informed her of the message below.  Patient gave the phone to her husband and he was informed of the message below also.  Stated he prefers to have her seen at [facility] and wanted a virtual visit also.  Appt scheduled for today for a virtual visit with Dr Maudie Mercury as next opening with PCP is on 8/20.

## 2020-01-24 NOTE — Telephone Encounter (Signed)
If this is what she wants, I am ok with this, but just a couple of thoughts:  Our [place] [facility] would be more convenient and they are really good We haven't discussed acid reflux and I am happy to work with her to control symptoms if she would like to set up visit (virtual would be fine) to discuss.

## 2020-01-25 DIAGNOSIS — R0981 Nasal congestion: Secondary | ICD-10-CM | POA: Diagnosis not present

## 2020-01-25 DIAGNOSIS — L42 Pityriasis rosea: Secondary | ICD-10-CM | POA: Diagnosis not present

## 2020-01-25 DIAGNOSIS — E119 Type 2 diabetes mellitus without complications: Secondary | ICD-10-CM | POA: Diagnosis not present

## 2020-01-25 DIAGNOSIS — K219 Gastro-esophageal reflux disease without esophagitis: Secondary | ICD-10-CM | POA: Diagnosis not present

## 2020-01-25 DIAGNOSIS — Z853 Personal history of malignant neoplasm of breast: Secondary | ICD-10-CM | POA: Diagnosis not present

## 2020-01-25 DIAGNOSIS — K30 Functional dyspepsia: Secondary | ICD-10-CM | POA: Diagnosis not present

## 2020-01-25 DIAGNOSIS — I10 Essential (primary) hypertension: Secondary | ICD-10-CM | POA: Diagnosis not present

## 2020-01-25 DIAGNOSIS — Z20822 Contact with and (suspected) exposure to covid-19: Secondary | ICD-10-CM | POA: Diagnosis not present

## 2020-01-25 DIAGNOSIS — R490 Dysphonia: Secondary | ICD-10-CM | POA: Diagnosis not present

## 2020-01-26 ENCOUNTER — Telehealth: Payer: Self-pay | Admitting: Family Medicine

## 2020-01-26 NOTE — Telephone Encounter (Signed)
Per Doris Jackson she spoke with the pt and informed her to call back with the specific names and locations for the referrals requested as below.

## 2020-01-26 NOTE — Telephone Encounter (Signed)
Pt called and said she would like a referral to the following places at [place]   [phone]  Please advise

## 2020-02-02 DIAGNOSIS — J3089 Other allergic rhinitis: Secondary | ICD-10-CM | POA: Diagnosis not present

## 2020-02-02 DIAGNOSIS — R490 Dysphonia: Secondary | ICD-10-CM | POA: Diagnosis not present

## 2020-02-02 DIAGNOSIS — R1314 Dysphagia, pharyngoesophageal phase: Secondary | ICD-10-CM | POA: Diagnosis not present

## 2020-02-02 DIAGNOSIS — K219 Gastro-esophageal reflux disease without esophagitis: Secondary | ICD-10-CM | POA: Diagnosis not present

## 2020-02-02 DIAGNOSIS — J301 Allergic rhinitis due to pollen: Secondary | ICD-10-CM | POA: Diagnosis not present

## 2020-02-03 ENCOUNTER — Encounter: Payer: Self-pay | Admitting: Family Medicine

## 2020-02-03 ENCOUNTER — Ambulatory Visit (INDEPENDENT_AMBULATORY_CARE_PROVIDER_SITE_OTHER): Payer: Medicare HMO | Admitting: Family Medicine

## 2020-02-03 ENCOUNTER — Other Ambulatory Visit: Payer: Self-pay

## 2020-02-03 VITALS — BP 150/80 | Temp 98.1°F | Ht 62.0 in | Wt 219.2 lb

## 2020-02-03 DIAGNOSIS — E1129 Type 2 diabetes mellitus with other diabetic kidney complication: Secondary | ICD-10-CM | POA: Diagnosis not present

## 2020-02-03 DIAGNOSIS — L42 Pityriasis rosea: Secondary | ICD-10-CM

## 2020-02-03 DIAGNOSIS — E785 Hyperlipidemia, unspecified: Secondary | ICD-10-CM | POA: Diagnosis not present

## 2020-02-03 DIAGNOSIS — E876 Hypokalemia: Secondary | ICD-10-CM | POA: Diagnosis not present

## 2020-02-03 DIAGNOSIS — I10 Essential (primary) hypertension: Secondary | ICD-10-CM

## 2020-02-03 MED ORDER — HYDROCHLOROTHIAZIDE 12.5 MG PO TABS
12.5000 mg | ORAL_TABLET | Freq: Every day | ORAL | 1 refills | Status: DC
Start: 2020-02-03 — End: 2020-02-03

## 2020-02-03 MED ORDER — SPIRONOLACTONE 25 MG PO TABS: 12.5000 mg | ORAL_TABLET | Freq: Every day | ORAL | 1 refills | Status: DC

## 2020-02-03 NOTE — Progress Notes (Signed)
Doris Jackson DOB: 12-08-1940 Encounter date: 02/03/2020  This is a 79 y.o. female who presents with Chief Complaint  Patient presents with  . Hypertension    History of present illness: Blood pressure is still up and down. At [facility] last week was 160/97; yesterday was 160/84 - ate balogna sandwich and egg sandwich before she went to that first appointment. Seeing numbers in 140-160 at home. Thinks diastolic closer to 21-30'Q. No low pressures.   Seeing ENT next week due to hoarseness in throat.    Allergies  Allergen Reactions  . Dust Mite Extract Itching and Cough   Current Meds  Medication Sig  . acetaminophen (TYLENOL) 500 MG tablet Take 500 mg by mouth every 6 (six) hours as needed for mild pain.  Marland Kitchen amLODipine (NORVASC) 10 MG tablet Take 1 tablet (10 mg total) by mouth daily.  . Ascorbic Acid (VITAMIN C) 1000 MG tablet Take 1,000 mg by mouth daily.   . benazepril (LOTENSIN) 40 MG tablet Take 1 tablet (40 mg total) by mouth daily.  . Cholecalciferol (VITAMIN D) 2000 UNITS CAPS Take 4,000 Units by mouth daily.   . clotrimazole-betamethasone (LOTRISONE) cream APPLY TO AFFECTED AREA TWICE A DAY  . cyanocobalamin 2000 MCG tablet Take 2,000 mcg by mouth daily.    . DULoxetine (CYMBALTA) 20 MG capsule Take 1 capsule (20 mg total) by mouth at bedtime.  Marland Kitchen EPINEPHrine 0.3 mg/0.3 mL IJ SOAJ injection SMARTSIG:0.3 Milliliter(s) IM Once PRN  . FERREX 150 150 MG capsule TAKE 1 CAPSULE BY MOUTH EVERY DAY  . fexofenadine (ALLEGRA) 180 MG tablet Take 180 mg by mouth daily as needed for allergies.   . furosemide (LASIX) 20 MG tablet Take 1 tablet (20 mg total) by mouth daily as needed.  Marland Kitchen glipiZIDE (GLUCOTROL XL) 5 MG 24 hr tablet TAKE 1 TABLET BY MOUTH EVERY DAY (Patient taking differently: Take 5 mg by mouth daily. )  . Lancets 30G MISC USE TWICE DAILY AS DIRECTED FOR GLUCOSE TESTING AND MONITORING  . metoprolol succinate (TOPROL-XL) 50 MG 24 hr tablet Take 1 tablet (50 mg total) by mouth  daily. Take with or immediately following a meal.  . Multiple Vitamin (MULTIVITAMIN WITH MINERALS) TABS tablet Take 1 tablet by mouth daily.  Marland Kitchen omeprazole (PRILOSEC) 40 MG capsule Take 1 capsule (40 mg total) by mouth at bedtime.  Marland Kitchen ONE TOUCH LANCETS MISC Test twice daily.  Glory Rosebush VERIO test strip TEST TWICE A DAY. DX E11.9  . pioglitazone (ACTOS) 45 MG tablet TAKE 1 TABLET BY MOUTH EVERY DAY (Patient taking differently: Take 45 mg by mouth daily. )  . potassium chloride (MICRO-K) 10 MEQ CR capsule Take 1 capsule BID x 3 days, then take with use of lasix  . pravastatin (PRAVACHOL) 40 MG tablet Take 1 tablet (40 mg total) by mouth daily. (Patient taking differently: Take 40 mg by mouth 2 (two) times a week. )  . prednisoLONE acetate (PRED FORTE) 1 % ophthalmic suspension Place 1 drop into both eyes See admin instructions. Place 1 drop into left eye three times daily Place 1 drop into right eye once daily  . Probiotic Product (PROBIOTIC-10 PO) Take 1 capsule by mouth daily.   Marland Kitchen senna-docusate (SENOKOT-S) 8.6-50 MG tablet Take 1 tablet by mouth at bedtime. (Patient taking differently: Take 1 tablet by mouth at bedtime as needed for mild constipation. )  . SitaGLIPtin-MetFORMIN HCl (JANUMET XR) [phone] MG TB24 Take 1 tablet by mouth daily.    Review of Systems  Constitutional: Negative for chills, fatigue and fever.  Respiratory: Negative for cough, chest tightness, shortness of breath and wheezing.   Cardiovascular: Negative for chest pain, palpitations and leg swelling.    Objective:  BP (!) 150/80 (BP Location: Left Arm, Patient Position: Sitting, Cuff Size: Large)   Temp 98.1 F (36.7 C) (Oral)   Ht 5\' 2"  (1.575 m)   Wt 219 lb 3.2 oz (99.4 kg)   BMI 40.09 kg/m   Weight: 219 lb 3.2 oz (99.4 kg)   BP Readings from Last 3 Encounters:  02/03/20 (!) 150/80  01/11/20 134/64  01/03/20 128/76   Wt Readings from Last 3 Encounters:  02/03/20 219 lb 3.2 oz (99.4 kg)  01/11/20 218 lb  9.6 oz (99.2 kg)  01/03/20 215 lb (97.5 kg)    Physical Exam Constitutional:      General: She is not in acute distress.    Appearance: She is well-developed.  Cardiovascular:     Rate and Rhythm: Normal rate and regular rhythm.     Heart sounds: Normal heart sounds. No murmur heard.  No friction rub.  Pulmonary:     Effort: Pulmonary effort is normal. No respiratory distress.     Breath sounds: Normal breath sounds. No wheezing or rales.  Musculoskeletal:     Right lower leg: No edema.     Left lower leg: No edema.  Neurological:     Mental Status: She is alert and oriented to person, place, and time.  Psychiatric:        Behavior: Behavior normal.     Assessment/Plan  1. Pityriasis rosea Suspected arises since no ongoing symptoms, no itching, no irritation with rash.  2. Essential hypertension Blood pressures not ideally controlled.  Would like to avoid diuretics since she has kept hypokalemia in spite of taking potassium supplement.  Instead, I will add spironolactone and she will monitor blood pressures at home.  Follow-up in 1 month for recheck. - Ambulatory referral to Chronic Care Management Services  3. Hypokalemia She is taking potassium supplementation.  Willing to keep an eye on her potassium level given change in medications.  We will plan to have her repeat blood work in 1 month's time.  4. 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  We are going to separate medications again.  Once we get everything controlled, may be able to put her on combination pills with blood pressure medications to help decrease pill burden, but have also put in pharmacy referral since looking for ways to simplify medications without going to large pills. - Ambulatory referral to Chronic Care Management Services  5. Dyslipidemia Continue with pravastatin. - Ambulatory  referral to Chronic Care Management Services   Return for pending chart review.    [name], MD

## 2020-02-03 NOTE — Patient Instructions (Signed)

## 2020-02-06 MED ORDER — METFORMIN HCL ER 500 MG PO TB24: 1000.0000 mg | ORAL_TABLET | Freq: Every day | ORAL | 1 refills | Status: DC

## 2020-02-06 MED ORDER — SITAGLIPTIN PHOSPHATE 100 MG PO TABS
100.0000 mg | ORAL_TABLET | Freq: Every day | ORAL | 1 refills | Status: DC
Start: 2020-02-06 — End: 2020-04-06

## 2020-02-07 ENCOUNTER — Telehealth: Payer: Self-pay | Admitting: *Deleted

## 2020-02-07 ENCOUNTER — Telehealth: Payer: Self-pay | Admitting: Family Medicine

## 2020-02-07 NOTE — Progress Notes (Signed)
  Chronic Care Management   Note  02/07/2020 Name: Doris Jackson MRN: [id number] DOB: Jun 14, 1941  Doris Jackson is a 79 y.o. year old female who is a primary care patient of Koberlein, Steele Berg, MD. I reached out to [name] by phone today in response to a referral sent by Doris Jackson's PCP, Caren Macadam, MD.   Ms. Buren was given information about Chronic Care Management services today including:  1. CCM service includes personalized support from designated clinical staff supervised by her physician, including individualized plan of care and coordination with other care providers 2. 24/7 contact phone numbers for assistance for urgent and routine care needs. 3. Service will only be billed when office clinical staff spend 20 minutes or more in a month to coordinate care. 4. Only one practitioner may furnish and bill the service in a calendar month. 5. The patient may stop CCM services at any time (effective at the end of the month) by phone call to the office staff.   Patient agreed to services and verbal consent obtained.   Follow up plan:   Carley Perdue [employer] Scheduler

## 2020-02-07 NOTE — Telephone Encounter (Signed)
-----   Message from Caren Macadam, MD sent at 02/06/2020  4:12 PM EDT ----- Please see how blood pressures are doing on new medication?  I do not suspect that her potassium is causing current rash she has.  Let me know if any worsening.  Please set up lab visit to complete in 3 to 4 weeks time with office visit to follow.  Please let me know if not noting improvement in blood pressure in the meanwhile.

## 2020-02-07 NOTE — Telephone Encounter (Signed)
Spoke with the pt and she stated she has not checked her BP at home, has an appt with a doctor tomorrow and will have it checked at that time.  Patient stated she will call back for a lab and follow up appt.  Message sent to PCP.

## 2020-02-08 ENCOUNTER — Encounter (INDEPENDENT_AMBULATORY_CARE_PROVIDER_SITE_OTHER): Payer: Self-pay | Admitting: Otolaryngology

## 2020-02-08 ENCOUNTER — Ambulatory Visit (INDEPENDENT_AMBULATORY_CARE_PROVIDER_SITE_OTHER): Payer: Medicare HMO | Admitting: Otolaryngology

## 2020-02-08 ENCOUNTER — Other Ambulatory Visit: Payer: Self-pay

## 2020-02-08 VITALS — Temp 97.2°F

## 2020-02-08 DIAGNOSIS — J310 Chronic rhinitis: Secondary | ICD-10-CM

## 2020-02-08 DIAGNOSIS — R490 Dysphonia: Secondary | ICD-10-CM

## 2020-02-08 NOTE — Progress Notes (Addendum)
HPI: Doris Jackson is a 79 y.o. female who presents is referred by her PCP for evaluation of hoarseness.  Patient had 2 cervical fusions performed in March and April of this year with Dr Christella Noa.  She developed hoarseness following the second surgery.  Patient also complains of postnasal drainage as well as history of reflux disease.  She is using Flonase. She does not smoke.Marland Kitchen  Past Medical History:  Diagnosis Date  . ALLERGIC RHINITIS 06/17/2010  . ANEMIA-NOS 06/17/2010  . BREAST CANCER, HX OF 06/17/2010  . Cancer [facility]) 2008   right breast  . Cataract   . COPD 06/17/2010  . DIABETES MELLITUS, TYPE II 06/17/2010  . Endometriosis   . Fibroid    FIBROID  . GERD (gastroesophageal reflux disease)   . HYPERLIPIDEMIA 06/17/2010  . HYPERTENSION 06/17/2010  . Leg swelling   . LOW BACK PAIN 06/17/2010  . OSTEOARTHRITIS 06/17/2010  . OSTEOPENIA 06/17/2010  . Weakness    Past Surgical History:  Procedure Laterality Date  . ABDOMINAL HYSTERECTOMY  1988   TAH,LSO  . ANTERIOR CERVICAL DECOMP/DISCECTOMY FUSION N/A 09/15/2019   Procedure: Cervical five CorpectomyANTERIOR CERVICAL DECOMPRESSION/DISCECTOMY FUSION CERVICAL THREE-CERVICAL Four;  Surgeon: Ashok Pall, MD;  Location: [place];  Service: Neurosurgery;  Laterality: N/A;  . BREAST LUMPECTOMY Right 2007   LUMPECTOMY FOLLOWED BY RADIATION  . COLONOSCOPY    . CORNEAL TRANSPLANT Right 2020  . 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 EYE SURGERY Bilateral    Cataract  . HARDWARE REMOVAL N/A 10/21/2019   Procedure: Anterior cervical plate removal with revision;  Surgeon: Ashok Pall, MD;  Location: [place];  Service: Neurosurgery;  Laterality: N/A;  3C  . [place] Bilateral   . OOPHORECTOMY  1988   TAH,LSO  . TUBAL LIGATION     Social History   Socioeconomic History  . Marital status: Married    Spouse name: Not on file  . Number of children: Not on file  . Years of education: Not on file  . Highest education level: Not on file   Occupational History  . Not on file  Tobacco Use  . Smoking status: Never Smoker  . Smokeless tobacco: Never Used  Substance and Sexual Activity  . Alcohol use: No  . Drug use: No  . Sexual activity: Never    Birth control/protection: Post-menopausal, Surgical    Comment: Tubal Ligation  Other Topics Concern  . Not on file  Social History Narrative  . Not on file   Social Determinants of Health   Financial Resource Strain:   . Difficulty of Paying Living Expenses:   Food Insecurity:   . Worried About Charity fundraiser in the Last Year:   . Arboriculturist in the Last Year:   Transportation Needs:   . Film/video editor (Medical):   Marland Kitchen Lack of Transportation (Non-Medical):   Physical Activity:   . Days of Exercise per Week:   . Minutes of Exercise per Session:   Stress:   . Feeling of Stress :   Social Connections:   . Frequency of Communication with Friends and Family:   . Frequency of Social Gatherings with Friends and Family:   . Attends Religious Services:   . Active Member of Clubs or Organizations:   . Attends Archivist Meetings:   Marland Kitchen Marital Status:    Family History  Problem Relation Age of Onset  . Diabetes Mother   . Hypertension Sister   . Asthma Father   .  Diabetes Brother    Allergies  Allergen Reactions  . Dust Mite Extract Itching and Cough   Prior to Admission medications   Medication Sig Start Date End Date Taking? Authorizing Provider  acetaminophen (TYLENOL) 500 MG tablet Take 500 mg by mouth every 6 (six) hours as needed for mild pain.   Yes [provider]  Alum Hydroxide-Mag Carbonate (GAVISCON EXTRA RELIEF FORMULA) [id number] MG/10ML SUSP Take by mouth. 02/02/20  Yes [provider]  amLODipine (NORVASC) 10 MG tablet Take 1 tablet (10 mg total) by mouth daily. 11/10/19  Yes Koberlein, Steele Berg, MD  Ascorbic Acid (VITAMIN C) 1000 MG tablet Take 1,000 mg by mouth daily.    Yes [provider]  benazepril  (LOTENSIN) 40 MG tablet Take 1 tablet (40 mg total) by mouth daily. 12/23/19  Yes Koberlein, Steele Berg, MD  Cholecalciferol (VITAMIN D) 2000 UNITS CAPS Take 4,000 Units by mouth daily.    Yes [provider]  clotrimazole-betamethasone (LOTRISONE) cream APPLY TO AFFECTED AREA TWICE A DAY 11/30/19  Yes Koberlein, Junell C, MD  cyanocobalamin 2000 MCG tablet Take 2,000 mcg by mouth daily.     Yes [provider]  DULoxetine (CYMBALTA) 20 MG capsule Take 1 capsule (20 mg total) by mouth at bedtime. 11/30/19  Yes Koberlein, Andris Flurry C, MD  EPINEPHrine 0.3 mg/0.3 mL IJ SOAJ injection SMARTSIG:0.3 Milliliter(s) IM Once PRN 12/09/19  Yes [provider]  famotidine (PEPCID) 40 MG tablet Take by mouth. 02/02/20 02/01/21 Yes [provider]  FERREX 150 150 MG capsule TAKE 1 CAPSULE BY MOUTH EVERY DAY 11/01/19  Yes Koberlein, Junell C, MD  fexofenadine (ALLEGRA) 180 MG tablet Take 180 mg by mouth daily as needed for allergies.    Yes [provider]  furosemide (LASIX) 20 MG tablet Take 1 tablet (20 mg total) by mouth daily as needed. 11/16/19  Yes Koberlein, Junell C, MD  glipiZIDE (GLUCOTROL XL) 5 MG 24 hr tablet TAKE 1 TABLET BY MOUTH EVERY DAY Patient taking differently: Take 5 mg by mouth daily.  06/07/19  Yes Isaac Bliss, Rayford Halsted, MD  Lancets 30G MISC USE TWICE DAILY AS DIRECTED FOR GLUCOSE TESTING AND MONITORING 01/19/14  Yes Marletta Lor, MD  metFORMIN (GLUCOPHAGE-XR) 500 MG 24 hr tablet Take 2 tablets (1,000 mg total) by mouth daily with breakfast. 02/06/20  Yes Koberlein, Junell C, MD  metoprolol succinate (TOPROL-XL) 50 MG 24 hr tablet Take 1 tablet (50 mg total) by mouth daily. Take with or immediately following a meal. 09/23/19  Yes Donne Taziah, MD  Multiple Vitamin (MULTIVITAMIN WITH MINERALS) TABS tablet Take 1 tablet by mouth daily. 10/07/19  Yes Love, Ivan Anchors, PA-C  omeprazole (PRILOSEC) 40 MG capsule Take 1 capsule (40 mg total) by mouth at bedtime.  11/30/19  Yes Koberlein, Steele Berg, MD  ONE TOUCH LANCETS MISC Test twice daily. 08/01/14  Yes Marletta Lor, MD  [facility] VERIO test strip TEST TWICE A DAY. DX E11.9 11/23/19  Yes Koberlein, Junell C, MD  pioglitazone (ACTOS) 45 MG tablet TAKE 1 TABLET BY MOUTH EVERY DAY Patient taking differently: Take 45 mg by mouth daily.  10/12/19  Yes Koberlein, Steele Berg, MD  potassium chloride (MICRO-K) 10 MEQ CR capsule Take 1 capsule BID x 3 days, then take with use of lasix 11/16/19  Yes Koberlein, Junell C, MD  pravastatin (PRAVACHOL) 40 MG tablet Take 1 tablet (40 mg total) by mouth daily. Patient taking differently: Take 40 mg by mouth 2 (  two) times a week.  02/25/18  Yes Marletta Lor, MD  prednisoLONE acetate (PRED FORTE) 1 % ophthalmic suspension Place 1 drop into both eyes See admin instructions. Place 1 drop into left eye three times daily Place 1 drop into right eye once daily 09/14/19  Yes [provider]  Probiotic Product (PROBIOTIC-10 PO) Take 1 capsule by mouth daily.    Yes [provider]  senna-docusate (SENOKOT-S) 8.6-50 MG tablet Take 1 tablet by mouth at bedtime. Patient taking differently: Take 1 tablet by mouth at bedtime as needed for mild constipation.  10/06/19  Yes Love, Ivan Anchors, PA-C  sitaGLIPtin (JANUVIA) 100 MG tablet Take 1 tablet (100 mg total) by mouth daily. 02/06/20  Yes Koberlein, Steele Berg, MD  spironolactone (ALDACTONE) 25 MG tablet Take 0.5 tablets (12.5 mg total) by mouth daily. 02/03/20  Yes Koberlein, Steele Berg, MD     Positive ROS: Otherwise negative  All other systems have been reviewed and were otherwise negative with the exception of those mentioned in the HPI and as above.  Physical Exam: Constitutional: Alert, well-appearing, no acute distress.  Patient is moderately hoarse. Ears: External ears without lesions or tenderness. Ear canals are clear bilaterally with intact, clear TMs.  Nasal: External nose without lesions. Septum midline with  mild rhinitis.  Both middle meatus regions were clear with no clinical evidence of active infection..  Only clear mucus discharge within the nasal cavity. Oral: Lips and gums without lesions. Tongue and palate mucosa without lesions. Posterior oropharynx clear. Fiberoptic laryngoscopy was performed to the right nostril.  On fiberoptic laryngoscopy the nasopharynx was clear.  The base of tongue vallecula and epiglottis were normal.  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  Is in the paramedian position.  No vocal cord lesions noted on either side. Neck: No palpable adenopathy or masses.  Well-healed left lower neck scar from previous anterior cervical fusion surgery. Respiratory: Breathing comfortably  Skin: No facial/neck lesions or rash noted.  Laryngoscopy  Date/Time: 02/08/2020 1:58 PM Performed by: Rozetta Nunnery, MD Authorized by: Rozetta Nunnery, MD   Consent:    Consent obtained:  Verbal   Consent given by:  Patient Procedure details:    Indications: hoarseness, dysphagia, or aspiration     Medication:  Afrin   Instrument: flexible fiberoptic laryngoscope     Scope location: right nare   Sinus:    Right nasopharynx: normal   Mouth:    Base of tongue: normal     Epiglottis: normal   Throat:    True vocal cords: normal     immobility   Comments:     On fiberoptic laryngoscopy patient has minimal mobility of the left true vocal cord.  There are no vocal cord lesions.  Right vocal cord with normal mobility.    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 Chronic rhinitis.  Plan: Recommended follow-up in 3 to 4 months for recheck of the left true vocal cord.  At this point there is no specific therapy as this is secondary to recurrent laryngeal nerve trauma. For the postnasal drainage recommended regular use of the Flonase and saline irrigations. She will follow-up in 3 to 4 months.   Radene Journey, MD   CC:

## 2020-02-08 NOTE — Telephone Encounter (Signed)
Noted  

## 2020-02-13 DIAGNOSIS — R69 Illness, unspecified: Secondary | ICD-10-CM | POA: Diagnosis not present

## 2020-02-21 DIAGNOSIS — J3089 Other allergic rhinitis: Secondary | ICD-10-CM | POA: Diagnosis not present

## 2020-02-21 DIAGNOSIS — J301 Allergic rhinitis due to pollen: Secondary | ICD-10-CM | POA: Diagnosis not present

## 2020-02-22 ENCOUNTER — Telehealth: Payer: Self-pay | Admitting: Family Medicine

## 2020-02-22 NOTE — Telephone Encounter (Signed)
The patient needs for Dr. Ethlyn Gallery to separate her metFORMIN (GLUCOPHAGE-XR) 500 MG 24 hr tablet and her sitaGLIPtin (JANUVIA) 100 MG tablet.  She needs a new Rx for sitaGLIPtin (JANUVIA) 100 MG tablet sent to the pharmacy  [id number] - [place], [place] Phone:  [phone]  Fax:  [phone]         She also wanted to let Dr. Ethlyn Gallery know that her BP is doing pretty good.

## 2020-02-22 NOTE — Telephone Encounter (Signed)
Unable to leave a message due to voicemail being full. 

## 2020-02-22 NOTE — Telephone Encounter (Signed)
I did separate those at her last visit and [facility]? Please check with the pharamcy.   Glad pressures are doing better. Can she give me some specific numbers? thanks

## 2020-02-24 NOTE — Telephone Encounter (Signed)
Spoke with the pt and informed her of the message below.  Patient stated her BP reading was 122/87 one day this week and systolic readings have been ranging in the 190V-222I and diastolic readings have been in the 80s.  Message sent to PCP.

## 2020-02-24 NOTE — Telephone Encounter (Signed)
Great! Please make sure that pharmacy has the individual meds for her that I had sent in. Thanks!

## 2020-02-27 NOTE — Telephone Encounter (Signed)
Per the pharmacist Z[facility] Resides at [facility], the Rxs were received.

## 2020-03-13 DIAGNOSIS — T84216A Breakdown (mechanical) of internal fixation device of vertebrae, initial encounter: Secondary | ICD-10-CM | POA: Diagnosis not present

## 2020-03-13 DIAGNOSIS — J301 Allergic rhinitis due to pollen: Secondary | ICD-10-CM | POA: Diagnosis not present

## 2020-03-13 DIAGNOSIS — J3089 Other allergic rhinitis: Secondary | ICD-10-CM | POA: Diagnosis not present

## 2020-03-14 ENCOUNTER — Telehealth: Payer: Self-pay | Admitting: Family Medicine

## 2020-03-14 DIAGNOSIS — R490 Dysphonia: Secondary | ICD-10-CM | POA: Diagnosis not present

## 2020-03-14 DIAGNOSIS — R1312 Dysphagia, oropharyngeal phase: Secondary | ICD-10-CM | POA: Diagnosis not present

## 2020-03-14 DIAGNOSIS — K2100 Gastro-esophageal reflux disease with esophagitis, without bleeding: Secondary | ICD-10-CM | POA: Diagnosis not present

## 2020-03-14 DIAGNOSIS — R1314 Dysphagia, pharyngoesophageal phase: Secondary | ICD-10-CM | POA: Diagnosis not present

## 2020-03-14 DIAGNOSIS — J392 Other diseases of pharynx: Secondary | ICD-10-CM | POA: Diagnosis not present

## 2020-03-14 NOTE — Telephone Encounter (Signed)
error 

## 2020-03-19 ENCOUNTER — Telehealth: Payer: Self-pay | Admitting: Family Medicine

## 2020-03-19 NOTE — Telephone Encounter (Signed)
Pt said her medication was combined. She wants the medication separated. She wants to pay one at a time and not together. She said they are charging her to much for both of them.  metFORMIN (GLUCOPHAGE-XR) 500 MG 24 hr tablet   sitaGLIPtin (JANUVIA) 100 MG tablet     Please call the pt [phone]

## 2020-03-19 NOTE — Telephone Encounter (Signed)
Spoke with the pt and she stated the price for Januvia was $100, which used to cost $47.  I  informed her to let the pharmacy know she only wants to pick up one medication at a time to see if that would help.  I also advised the pt to call her insurance company to ask what other medications similar to this would be covered, cheaper and she agreed.

## 2020-03-21 ENCOUNTER — Ambulatory Visit (INDEPENDENT_AMBULATORY_CARE_PROVIDER_SITE_OTHER): Payer: Medicare HMO | Admitting: Family Medicine

## 2020-03-21 ENCOUNTER — Other Ambulatory Visit: Payer: Self-pay

## 2020-03-21 ENCOUNTER — Telehealth: Payer: Self-pay

## 2020-03-21 ENCOUNTER — Encounter: Payer: Self-pay | Admitting: Family Medicine

## 2020-03-21 VITALS — BP 118/70 | HR 85 | Temp 98.1°F | Ht 62.0 in | Wt 206.5 lb

## 2020-03-21 DIAGNOSIS — E559 Vitamin D deficiency, unspecified: Secondary | ICD-10-CM | POA: Diagnosis not present

## 2020-03-21 DIAGNOSIS — Z23 Encounter for immunization: Secondary | ICD-10-CM | POA: Diagnosis not present

## 2020-03-21 DIAGNOSIS — R944 Abnormal results of kidney function studies: Secondary | ICD-10-CM | POA: Diagnosis not present

## 2020-03-21 DIAGNOSIS — E785 Hyperlipidemia, unspecified: Secondary | ICD-10-CM | POA: Diagnosis not present

## 2020-03-21 DIAGNOSIS — E538 Deficiency of other specified B group vitamins: Secondary | ICD-10-CM | POA: Diagnosis not present

## 2020-03-21 DIAGNOSIS — I10 Essential (primary) hypertension: Secondary | ICD-10-CM

## 2020-03-21 DIAGNOSIS — E1129 Type 2 diabetes mellitus with other diabetic kidney complication: Secondary | ICD-10-CM

## 2020-03-21 DIAGNOSIS — R490 Dysphonia: Secondary | ICD-10-CM | POA: Diagnosis not present

## 2020-03-21 DIAGNOSIS — D649 Anemia, unspecified: Secondary | ICD-10-CM | POA: Diagnosis not present

## 2020-03-21 MED ORDER — OMEPRAZOLE 40 MG PO CPDR: 40.0000 mg | DELAYED_RELEASE_CAPSULE | Freq: Two times a day (BID) | ORAL | 1 refills | Status: DC

## 2020-03-21 NOTE — Progress Notes (Signed)
Chronic Care Management Pharmacy Assistant   Name: Doris Jackson  MRN: [id number] DOB: March 08, 1941  Reason for Encounter: Medication Review/ Initial question for initial visit with clinical pharmacist.  PCP : Caren Macadam, MD  Allergies:   Allergies  Allergen Reactions  . Dust Mite Extract Itching and Cough    Medications: Outpatient Encounter Medications as of 03/21/2020  Medication Sig Note  . acetaminophen (TYLENOL) 500 MG tablet Take 500 mg by mouth every 6 (six) hours as needed for mild pain.   Marland Kitchen Alum Hydroxide-Mag Carbonate (GAVISCON EXTRA RELIEF FORMULA) 508-475 MG/10ML SUSP Take by mouth.   Marland Kitchen amLODipine (NORVASC) 10 MG tablet Take 1 tablet (10 mg total) by mouth daily.   . Ascorbic Acid (VITAMIN C) 1000 MG tablet Take 1,000 mg by mouth daily.    . benazepril (LOTENSIN) 40 MG tablet Take 1 tablet (40 mg total) by mouth daily.   . Cholecalciferol (VITAMIN D) 2000 UNITS CAPS Take 4,000 Units by mouth daily.    . clotrimazole-betamethasone (LOTRISONE) cream APPLY TO AFFECTED AREA TWICE A DAY   . cyanocobalamin 2000 MCG tablet Take 2,000 mcg by mouth daily.     . DULoxetine (CYMBALTA) 20 MG capsule Take 1 capsule (20 mg total) by mouth at bedtime.   Marland Kitchen EPINEPHrine 0.3 mg/0.3 mL IJ SOAJ injection SMARTSIG:0.3 Milliliter(s) IM Once PRN   . famotidine (PEPCID) 40 MG tablet Take by mouth.   . FERREX 150 150 MG capsule TAKE 1 CAPSULE BY MOUTH EVERY DAY   . fexofenadine (ALLEGRA) 180 MG tablet Take 180 mg by mouth daily as needed for allergies.    . furosemide (LASIX) 20 MG tablet Take 1 tablet (20 mg total) by mouth daily as needed.   Marland Kitchen glipiZIDE (GLUCOTROL XL) 5 MG 24 hr tablet TAKE 1 TABLET BY MOUTH EVERY DAY (Patient taking differently: Take 5 mg by mouth daily. )   . Lancets 30G MISC USE TWICE DAILY AS DIRECTED FOR GLUCOSE TESTING AND MONITORING   . metFORMIN (GLUCOPHAGE-XR) 500 MG 24 hr tablet Take 2 tablets (1,000 mg total) by mouth daily with breakfast.   . metoprolol  succinate (TOPROL-XL) 50 MG 24 hr tablet Take 1 tablet (50 mg total) by mouth daily. Take with or immediately following a meal.   . Multiple Vitamin (MULTIVITAMIN WITH MINERALS) TABS tablet Take 1 tablet by mouth daily.   Marland Kitchen omeprazole (PRILOSEC) 40 MG capsule Take 1 capsule (40 mg total) by mouth at bedtime.   Marland Kitchen ONE TOUCH LANCETS MISC Test twice daily.   Glory Rosebush VERIO test strip TEST TWICE A DAY. DX E11.9   . pioglitazone (ACTOS) 45 MG tablet TAKE 1 TABLET BY MOUTH EVERY DAY (Patient taking differently: Take 45 mg by mouth daily. )   . potassium chloride (MICRO-K) 10 MEQ CR capsule Take 1 capsule BID x 3 days, then take with use of lasix   . pravastatin (PRAVACHOL) 40 MG tablet Take 1 tablet (40 mg total) by mouth daily. (Patient taking differently: Take 40 mg by mouth 2 (two) times a week. ) 09/14/2019: Pt said it upsets her stomach so she doesn't take it often.   . prednisoLONE acetate (PRED FORTE) 1 % ophthalmic suspension Place 1 drop into both eyes See admin instructions. Place 1 drop into left eye three times daily Place 1 drop into right eye once daily   . Probiotic Product (PROBIOTIC-10 PO) Take 1 capsule by mouth daily.    Marland Kitchen senna-docusate (SENOKOT-S) 8.6-50 MG tablet Take  1 tablet by mouth at bedtime. (Patient taking differently: Take 1 tablet by mouth at bedtime as needed for mild constipation. )   . sitaGLIPtin (JANUVIA) 100 MG tablet Take 1 tablet (100 mg total) by mouth daily.   Marland Kitchen spironolactone (ALDACTONE) 25 MG tablet Take 0.5 tablets (12.5 mg total) by mouth daily.    No facility-administered encounter medications on file as of 03/21/2020.    Current Diagnosis: Patient Active Problem List   Diagnosis Date Noted  . 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 Anxiety state   . Acute blood loss anemia   . Postoperative pain   . Cervical myelopathy ([place]) 09/22/2019  . S/P cervical spinal fusion 09/22/2019  . Cervical spondylosis with myelopathy and  radiculopathy 09/15/2019  . AKI (acute kidney injury) ([place]) 09/14/2019  . GERD (gastroesophageal reflux disease) 09/14/2019  . Spinal stenosis in cervical region 09/14/2019  . Weakness 09/14/2019  . Numbness and tingling 09/14/2019  . Cervical spinal stenosis 09/14/2019  . Lumbar stenosis 08/24/2019  . Obesity, morbid, BMI 40.0-49.9 ([place]) 04/19/2018  . Cataract   . Fibroid   . Endometriosis   . DM (diabetes mellitus) type II controlled with renal manifestation ([place]) 06/17/2010  . Dyslipidemia 06/17/2010  . Microcytic anemia 06/17/2010  . Essential hypertension 06/17/2010  . Allergic rhinitis 06/17/2010  . Osteoarthritis 06/17/2010  . LOW BACK PAIN 06/17/2010  . OSTEOPENIA 06/17/2010  . BREAST CANCER, HX OF 06/17/2010      Follow-Up:  Pharmacist Review  Have you seen any other providers since your last visit? no Any changes in your medications or health? no Any side effects from any medications? no Do you have an symptoms or problems not managed by your medications? no Any concerns about your health right now? Yes  Patient states she is suppose to receive speech therapy at [facility] due to her voice beng hoarse from her surgery she had.  Has your provider asked that you check blood pressure, blood sugar, or follow special diet at home? Yes  Patient states she checks her blood pressure at home ranging 140~160/70's. Patient states her blood pressure was good at her appointment on 03/21/2020 it was 118/70.  Patients states she eats out at restaurants most of the time.  Do you get any type of exercise on a regular basis? No  Patient states she uses a walker to help walk but she would like to start using a cane soon.Patient reports she does exercise in her chair by moving her legs up and down. Can you think of a goal you would like to reach for your health? None ID Do you have any problems getting your medications? Yes  Patient states her medication are expensive. Is there anything that  you would like to discuss during the appointment?   Patient states she would like to discuss patient assistance to help with expenses of her mediation. Hildred Priest)   Please bring medications and supplements to appointment  [place] Pharmacist Assistant [phone]

## 2020-03-21 NOTE — Progress Notes (Signed)
Doris Jackson DOB: [birth year] Encounter date: 03/21/2020  This is a 79 y.o. female who presents with Chief Complaint  Patient presents with  . Follow-up    History of present illness:  Got a little dizzy yesterday. Last week at doc was 779 systolic. This is first time she has seen this. Otherwise has really been doing well.   She is feeling pretty good overall.   Saw doc at [facility] for voice issue - they are going to work on her and recommend speech care center for further treatment. Feels tired after taking the pepcid. Doing better with cough now. She is taking the omeprazole twice daily.   Saw doc in [place] for reflux. She is doing better. She is walking with walker through house. Doesn't trust self to use cane yet.   Still has spots on back; getting more on chest. Some on legs; none on arm.   Just regular arthritis pain, can tell when it is going to rain. Neck is doing well. She is doing her range of motion exercises and has resistance bands but has not been using those regularly at this point.   Allergies  Allergen Reactions  . Dust Mite Extract Itching and Cough   Current Meds  Medication Sig  . acetaminophen (TYLENOL) 500 MG tablet Take 500 mg by mouth every 6 (six) hours as needed for mild pain.  Marland Kitchen Alum Hydroxide-Mag Carbonate (GAVISCON EXTRA RELIEF FORMULA) 508-475 MG/10ML SUSP Take by mouth.  Marland Kitchen amLODipine (NORVASC) 10 MG tablet Take 1 tablet (10 mg total) by mouth daily.  . Ascorbic Acid (VITAMIN C) 1000 MG tablet Take 1,000 mg by mouth daily.  (Patient not taking: Reported on 03/22/2020)  . benazepril (LOTENSIN) 40 MG tablet Take 1 tablet (40 mg total) by mouth daily.  . Cholecalciferol (VITAMIN D) 2000 UNITS CAPS Take 4,000 Units by mouth daily.   . cyanocobalamin 2000 MCG tablet Take 2,000 mcg by mouth daily.   (Patient not taking: Reported on 03/22/2020)  . DULoxetine (CYMBALTA) 20 MG capsule Take 1 capsule (20 mg total) by mouth at bedtime.  Marland Kitchen EPINEPHrine 0.3 mg/0.3 mL  IJ SOAJ injection SMARTSIG:0.3 Milliliter(s) IM Once PRN  . famotidine (PEPCID) 40 MG tablet Take 40 mg by mouth at bedtime.   Marland Kitchen FERREX 150 150 MG capsule TAKE 1 CAPSULE BY MOUTH EVERY DAY  . fexofenadine (ALLEGRA) 180 MG tablet Take 180 mg by mouth daily as needed for allergies.  (Patient not taking: Reported on 03/22/2020)  . glipiZIDE (GLUCOTROL XL) 5 MG 24 hr tablet TAKE 1 TABLET BY MOUTH EVERY DAY (Patient taking differently: Take 5 mg by mouth daily. )  . Lancets 30G MISC USE TWICE DAILY AS DIRECTED FOR GLUCOSE TESTING AND MONITORING  . metFORMIN (GLUCOPHAGE-XR) 500 MG 24 hr tablet Take 2 tablets (1,000 mg total) by mouth daily with breakfast. (Patient taking differently: Take 500 mg by mouth 2 (two) times daily. )  . metoprolol succinate (TOPROL-XL) 50 MG 24 hr tablet Take 1 tablet (50 mg total) by mouth daily. Take with or immediately following a meal.  . Multiple Vitamin (MULTIVITAMIN WITH MINERALS) TABS tablet Take 1 tablet by mouth daily. (Patient not taking: Reported on 03/22/2020)  . omeprazole (PRILOSEC) 40 MG capsule Take 1 capsule (40 mg total) by mouth in the morning and at bedtime.  Marland Kitchen ONE TOUCH LANCETS MISC Test twice daily.  Glory Rosebush VERIO test strip TEST TWICE A DAY. DX E11.9  . pioglitazone (ACTOS) 45 MG tablet TAKE 1 TABLET  BY MOUTH EVERY DAY (Patient taking differently: Take 45 mg by mouth daily. )  . potassium chloride (MICRO-K) 10 MEQ CR capsule Take 1 capsule BID x 3 days, then take with use of lasix (Patient not taking: Reported on 03/22/2020)  . pravastatin (PRAVACHOL) 40 MG tablet Take 1 tablet (40 mg total) by mouth daily. (Patient taking differently: Take 40 mg by mouth 2 (two) times a week. )  . prednisoLONE acetate (PRED FORTE) 1 % ophthalmic suspension Place 1 drop into both eyes See admin instructions. Place 1 drop into left eye three times daily Place 1 drop into right eye once daily  . Probiotic Product (PROBIOTIC-10 PO) Take 1 capsule by mouth daily.  (Patient not  taking: Reported on 03/22/2020)  . senna-docusate (SENOKOT-S) 8.6-50 MG tablet Take 1 tablet by mouth at bedtime. (Patient not taking: Reported on 03/22/2020)  . sitaGLIPtin (JANUVIA) 100 MG tablet Take 1 tablet (100 mg total) by mouth daily.  Marland Kitchen spironolactone (ALDACTONE) 25 MG tablet Take 0.5 tablets (12.5 mg total) by mouth daily.  . [DISCONTINUED] clotrimazole-betamethasone (LOTRISONE) cream APPLY TO AFFECTED AREA TWICE A DAY  . [DISCONTINUED] furosemide (LASIX) 20 MG tablet Take 1 tablet (20 mg total) by mouth daily as needed.  . [DISCONTINUED] omeprazole (PRILOSEC) 40 MG capsule Take 1 capsule (40 mg total) by mouth at bedtime.    Review of Systems  Constitutional: Negative for chills, fatigue and fever.  Respiratory: Negative for cough, chest tightness, shortness of breath and wheezing.   Cardiovascular: Positive for leg swelling (better). Negative for chest pain and palpitations.  Gastrointestinal: Negative for abdominal pain.  Musculoskeletal: Positive for arthralgias.  Skin: Positive for rash.  Neurological: Negative for syncope. Dizziness: single moentary episode getting out of car.    Objective:  BP 118/70 (BP Location: Left Arm, Patient Position: Sitting, Cuff Size: Large)   Pulse 85   Temp 98.1 F (36.7 C) (Oral)   Ht 5\' 2"  (1.575 m)   Wt 206 lb 8 oz (93.7 kg)   BMI 37.77 kg/m   Weight: 206 lb 8 oz (93.7 kg)   BP Readings from Last 3 Encounters:  03/21/20 118/70  02/03/20 (!) 150/80  01/11/20 134/64   Wt Readings from Last 3 Encounters:  03/21/20 206 lb 8 oz (93.7 kg)  02/03/20 219 lb 3.2 oz (99.4 kg)  01/11/20 218 lb 9.6 oz (99.2 kg)    Physical Exam Constitutional:      General: She is not in acute distress.    Appearance: She is well-developed.  Cardiovascular:     Rate and Rhythm: Normal rate and regular rhythm.     Heart sounds: Normal heart sounds. No murmur heard.  No friction rub.  Pulmonary:     Effort: Pulmonary effort is normal. No respiratory  distress.     Breath sounds: Normal breath sounds. No wheezing or rales.  Abdominal:     General: Bowel sounds are normal. There is no distension.     Palpations: Abdomen is soft.  Musculoskeletal:     Right lower leg: No edema.     Left lower leg: No edema.  Skin:    General: Skin is warm.     Comments: Hyperpigmented macular rash over upper back  Neurological:     Mental Status: She is alert and oriented to person, place, and time.  Psychiatric:        Mood and Affect: Mood normal.        Behavior: Behavior normal.  Assessment/Plan  1. Hoarse voice quality We will refer her to the voice center suggested by her [place] specialist. I have reached out to referrals to put this into the system.   2. Vitamin D deficiency - VITAMIN D 25 Hydroxy (Vit-D Deficiency, Fractures)  3. Vitamin B12 deficiency - Vitamin B12  4. Anemia, unspecified type - Ferritin  5. Essential hypertension Well controlled; she will monitor at home to make sure that pressures do not drop.  She will also monitor for dizziness/lightheadedness. - CBC with Differential/Platelet  6. 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  Message was sent to in-house pharmacist to see if they can help [place] with finding most affordable medications. - Comprehensive metabolic panel - Microalbumin / creatinine urine ratio - Hemoglobin A1c  7. Dyslipidemia Pravastatin 40 mg twice weekly. - Lipid panel  8. Need for immunization against influenza - Flu Vaccine QUAD High Dose(Fluad)  I was able to get some pictures of rash today. She does feel like rash has increased, but no symptoms associated with rash. She was instructed by specialist from [facility] to spend some time in sun to resolve rash. Antifungal-steroid cream given but she did not use this. I am going to consult with another physician and will  get back to her with follow up plan.  Return in about 6 months (around 09/18/2020).    [name], MD

## 2020-03-21 NOTE — Chronic Care Management (AMB) (Signed)
Chronic Care Management Pharmacy  Name: Doris Jackson  MRN: [id number] DOB: 08/06/1940   Chief Complaint/ HPI  Doris Jackson,  79 y.o. , female presents for their Initial CCM visit with the clinical pharmacist via telephone.  PCP : Caren Macadam, MD Patient Care Team: Caren Macadam, MD as PCP - General (Family Medicine) Germaine Pomfret, [facility] as Pharmacist (Pharmacist)  Their chronic conditions include: Hypertension, Hyperlipidemia, Diabetes, GERD, Anxiety, Osteopenia, Osteoarthritis and Allergic Rhinitis   Office Visits: 03/21/20: Patient presented to Dr. Ethlyn Gallery for hoarse voice. A1c improved to 6.2%. Furosemide, Clotrimazole stopped. Omeprazole increased to 40 mg twice daily.  01/11/20: Patient presented to Dr. Ethlyn Gallery for follow-up.   Consult Visit: 03/14/20: Patient presented to Dr. Adella Nissen (Ophthalmology) for swallow study.  02/08/20: Patient presented to Dr. Lucia Gaskins (Otolaryngology) for hoarseness.  02/03/20: Patient presented to Dr. Ethlyn Gallery for follow-up. Spironolactone 12.5 mg daily started.  02/02/20: Patient presented to Dr. Terrace Arabia (GI) for hoarseness.   Allergies  Allergen Reactions  . Dust Mite Extract Itching and Cough    Medications: Outpatient Encounter Medications as of 03/22/2020  Medication Sig Note  . acetaminophen (TYLENOL) 500 MG tablet Take 500 mg by mouth every 6 (six) hours as needed for mild pain.   Marland Kitchen Alum Hydroxide-Mag Carbonate (GAVISCON EXTRA RELIEF FORMULA) 508-475 MG/10ML SUSP Take by mouth.   Marland Kitchen amLODipine (NORVASC) 10 MG tablet Take 1 tablet (10 mg total) by mouth daily.   . Ascorbic Acid (VITAMIN C) 1000 MG tablet Take 1,000 mg by mouth daily.    . benazepril (LOTENSIN) 40 MG tablet Take 1 tablet (40 mg total) by mouth daily.   . Cholecalciferol (VITAMIN D) 2000 UNITS CAPS Take 4,000 Units by mouth daily.    . cyanocobalamin 2000 MCG tablet Take 2,000 mcg by mouth daily.     . DULoxetine (CYMBALTA) 20 MG capsule Take 1 capsule  (20 mg total) by mouth at bedtime.   Marland Kitchen EPINEPHrine 0.3 mg/0.3 mL IJ SOAJ injection SMARTSIG:0.3 Milliliter(s) IM Once PRN   . famotidine (PEPCID) 40 MG tablet Take by mouth.   . FERREX 150 150 MG capsule TAKE 1 CAPSULE BY MOUTH EVERY DAY   . fexofenadine (ALLEGRA) 180 MG tablet Take 180 mg by mouth daily as needed for allergies.    Marland Kitchen glipiZIDE (GLUCOTROL XL) 5 MG 24 hr tablet TAKE 1 TABLET BY MOUTH EVERY DAY (Patient taking differently: Take 5 mg by mouth daily. )   . Lancets 30G MISC USE TWICE DAILY AS DIRECTED FOR GLUCOSE TESTING AND MONITORING   . metFORMIN (GLUCOPHAGE-XR) 500 MG 24 hr tablet Take 2 tablets (1,000 mg total) by mouth daily with breakfast.   . metoprolol succinate (TOPROL-XL) 50 MG 24 hr tablet Take 1 tablet (50 mg total) by mouth daily. Take with or immediately following a meal.   . Multiple Vitamin (MULTIVITAMIN WITH MINERALS) TABS tablet Take 1 tablet by mouth daily.   Marland Kitchen omeprazole (PRILOSEC) 40 MG capsule Take 1 capsule (40 mg total) by mouth in the morning and at bedtime.   Marland Kitchen ONE TOUCH LANCETS MISC Test twice daily.   Glory Rosebush VERIO test strip TEST TWICE A DAY. DX E11.9   . pioglitazone (ACTOS) 45 MG tablet TAKE 1 TABLET BY MOUTH EVERY DAY (Patient taking differently: Take 45 mg by mouth daily. )   . potassium chloride (MICRO-K) 10 MEQ CR capsule Take 1 capsule BID x 3 days, then take with use of lasix   . pravastatin (PRAVACHOL) 40  MG tablet Take 1 tablet (40 mg total) by mouth daily. (Patient taking differently: Take 40 mg by mouth 2 (two) times a week. ) 09/14/2019: Pt said it upsets her stomach so she doesn't take it often.   . prednisoLONE acetate (PRED FORTE) 1 % ophthalmic suspension Place 1 drop into both eyes See admin instructions. Place 1 drop into left eye three times daily Place 1 drop into right eye once daily   . Probiotic Product (PROBIOTIC-10 PO) Take 1 capsule by mouth daily.    Marland Kitchen senna-docusate (SENOKOT-S) 8.6-50 MG tablet Take 1 tablet by mouth at  bedtime. (Patient taking differently: Take 1 tablet by mouth at bedtime as needed for mild constipation. )   . sitaGLIPtin (JANUVIA) 100 MG tablet Take 1 tablet (100 mg total) by mouth daily.   Marland Kitchen spironolactone (ALDACTONE) 25 MG tablet Take 0.5 tablets (12.5 mg total) by mouth daily.   . [DISCONTINUED] clotrimazole-betamethasone (LOTRISONE) cream APPLY TO AFFECTED AREA TWICE A DAY   . [DISCONTINUED] furosemide (LASIX) 20 MG tablet Take 1 tablet (20 mg total) by mouth daily as needed.   . [DISCONTINUED] omeprazole (PRILOSEC) 40 MG capsule Take 1 capsule (40 mg total) by mouth at bedtime.    No facility-administered encounter medications on file as of 03/22/2020.    Wt Readings from Last 3 Encounters:  03/21/20 206 lb 8 oz (93.7 kg)  02/03/20 219 lb 3.2 oz (99.4 kg)  01/11/20 218 lb 9.6 oz (99.2 kg)    Current Diagnosis/Assessment:    Goals Addressed   None    Diabetes   Diagnosed 1999  A1c goal <7%  Recent Relevant Labs: Lab Results  Component Value Date/Time   HGBA1C 6.2 (H) 03/21/2020 11:45 AM   HGBA1C 6.8 (H) 10/21/2019 01:07 PM   GFR 64.16 12/19/2019 01:04 PM   GFR 68.05 11/16/2019 02:55 PM   MICROALBUR 0.7 03/21/2020 11:45 AM   MICROALBUR 1.3 07/02/2017 01:38 PM    Last diabetic Eye exam:  Lab Results  Component Value Date/Time   HMDIABEYEEXA No Retinopathy 06/05/2016 12:00 AM    Last diabetic Foot exam: No results found for: HMDIABFOOTEX   Checking BG: 2x per Day  Recent FBG Readings: 104-105 Recent pre-meal BG readings: n/a Recent 2hr PP BG readings:  n/a Recent HS BG readings: 200, 157   Patient has failed these meds in past: n/a Patient is currently controlled on the following medications: Marland Kitchen Glipizide XL 5 mg daily (AM) . Metformin XR 500 mg 2 tablets daily (1 tablet twice daily) . Pioglitazone 45 mg daily (AM)  . Januvia 100 mg daily (PRN if >150) - AM   We discussed: diet and exercise extensively. Patient reports affordability concerns with  Januvia.  Plan  Continue current medications. Will start PAP application  Hypertension   BP goal is:  <130/80  Office blood pressures are  BP Readings from Last 3 Encounters:  03/21/20 118/70  02/03/20 (!) 150/80  01/11/20 134/64   CMP Latest Ref Rng & Units 03/21/2020 12/19/2019 11/16/2019  Glucose 65 - 99 mg/dL 94 244(H) 112(H)  BUN 7 - 25 mg/dL 37(H) 20 16  Creatinine 0.60 - 0.93 mg/dL 1.43(H) 1.01 0.96  Sodium 135 - 146 mmol/L 138 138 139  Potassium 3.5 - 5.3 mmol/L 4.0 3.7 3.3(L)  Chloride 98 - 110 mmol/L 102 102 102  CO2 20 - 32 mmol/L '26 27 30  ' Calcium 8.6 - 10.4 mg/dL 9.6 9.1 9.2  Total Protein 6.1 - 8.1 g/dL 7.1 - 7.3  Total Bilirubin 0.2 - 1.2 mg/dL 0.4 - 0.5  Alkaline Phos 39 - 117 U/L - - 110  AST 10 - 35 U/L 10 - 11  ALT 6 - 29 U/L 6 - 6     Patient checks BP at home 1-2x per week Patient home BP readings are ranging: 140~160/70's Typically, 112/67, 130/86.   Patient has failed these meds in the past: n/a Patient is currently controlled on the following medications:  . Amlodipine 10 mg daily (Afternoon)  . Benazepril 40 mg daily  . Hydrochlorothiazide 12.5 mg daily   . Metoprolol Succinate 50 mg daily  . Spironolactone 25 mg 1/2 tablet daily   We discussed: Eats out at [employer] 1-2 times weekly.   Plan  Continue current medications   Hyperlipidemia   LDL goal < 70  Lipid Panel     Component Value Date/Time   CHOL 192 03/21/2020 1145   TRIG 141 03/21/2020 1145   HDL 40 (L) 03/21/2020 1145   LDLCALC 127 (H) 03/21/2020 1145    Hepatic Function Latest Ref Rng & Units 03/21/2020 11/16/2019 09/23/2019  Total Protein 6.1 - 8.1 g/dL 7.1 7.3 6.2(L)  Albumin 3.5 - 5.2 g/dL - 3.9 2.7(L)  AST 10 - 35 U/L 10 11 14(L)  ALT 6 - 29 U/L '6 6 16  ' Alk Phosphatase 39 - 117 U/L - 110 71  Total Bilirubin 0.2 - 1.2 mg/dL 0.4 0.5 0.7  Bilirubin, Direct 0.0 - 0.3 mg/dL - - -     The 10-year ASCVD risk score Mikey Bussing DC Jr., et al., 2013) is: 28.4%   Values used to  calculate the score:     Age: 76 years     Sex: Female     Is Non-Hispanic African American: Yes     Diabetic: Yes     Tobacco smoker: No     Systolic Blood Pressure: 562 mmHg     Is BP treated: Yes     HDL Cholesterol: 40 mg/dL     Total Cholesterol: 192 mg/dL   Patient has failed these meds in past: n/a Patient is currently uncontrolled on the following medications:  . Pravastatin 40 mg daily (takes twice weekly - PRN Constipation)   We discussed:  diet and exercise extensively  Plan  Recommend stopping pravastatin due to intolerability at higher doses  Recommend starting Rosuvastatin 20 mg daily   GERD   Patient reports dysphagia, heartburn or regurgitation. Expresses understanding to avoid triggers such as citrus juices, large meals and lying down after eating.  Currently controlled on: Marland Kitchen Famotidine 40 mg QHS (Wooziness)  . Gaviscon Extra Relief 10 mL QHS  . Omeprazole 40 mg twice daily   Patient endorses not taking famotidine frequently due to dizziness she experiences with it.   Plan   Recommend decreasing famotidine to 20 mg daily.   Misc / OTC   . APAP 500 mg q6hr PRN  . Vitamin C 1000 mg daily  . Vitamin D 2000 units daily  . Lotrisone Cream . Vitamin B12 2000 mcg daily  . Duloxetine 20 mg daily - Spinal Fusion . Epinephrine  . Ferrex 150 mg daily - PRN  . Fexofenadine 180 mg daily PRN (1/2 tablet) . Multivitamin daily  . Potassium chloride 10 mEq  . Prednisolone 1% solution  . Probiotic daily  . Senna-Docusate 8.6-50 mg QHS PRN . Flonase 50 mcg/act 1-2 spray daily PRN . Advair 250-50  2 puff   Plan  Continue current medications  Medication  Management   Pt uses [facility] for all medications Uses pill box? Yes  We discussed: Discussed benefits of medication synchronization, packaging and delivery as well as enhanced pharmacist oversight with [employer].  Plan  Continue current medication management strategy  Follow up: 6 month phone  visit  Delia Pharmacist [facility] [place]

## 2020-03-22 ENCOUNTER — Ambulatory Visit: Payer: Medicare HMO

## 2020-03-22 ENCOUNTER — Telehealth: Payer: Self-pay

## 2020-03-22 DIAGNOSIS — E1129 Type 2 diabetes mellitus with other diabetic kidney complication: Secondary | ICD-10-CM

## 2020-03-22 DIAGNOSIS — I10 Essential (primary) hypertension: Secondary | ICD-10-CM

## 2020-03-22 LAB — CBC WITH DIFFERENTIAL/PLATELET
Absolute Monocytes: 666 cells/uL (ref 200–950)
Basophils Absolute: 30 cells/uL (ref 0–200)
Basophils Relative: 0.4 %
Eosinophils Absolute: 148 cells/uL (ref 15–500)
Eosinophils Relative: 2 %
HCT: 31.1 % — ABNORMAL LOW (ref 35.0–45.0)
Hemoglobin: 10.2 g/dL — ABNORMAL LOW (ref 11.7–15.5)
Lymphs Abs: 1576 cells/uL (ref 850–3900)
MCH: 26 pg — ABNORMAL LOW (ref 27.0–33.0)
MCHC: 32.8 g/dL (ref 32.0–36.0)
MCV: 79.3 fL — ABNORMAL LOW (ref 80.0–100.0)
MPV: 11.9 fL (ref 7.5–12.5)
Monocytes Relative: 9 %
Neutro Abs: 4980 cells/uL (ref 1500–7800)
Neutrophils Relative %: 67.3 %
Platelets: 173 10*3/uL (ref 140–400)
RBC: 3.92 10*6/uL (ref 3.80–5.10)
RDW: 18.1 % — ABNORMAL HIGH (ref 11.0–15.0)
Total Lymphocyte: 21.3 %
WBC: 7.4 10*3/uL (ref 3.8–10.8)

## 2020-03-22 LAB — LIPID PANEL
Cholesterol: 192 mg/dL (ref <200)
HDL: 40 mg/dL — ABNORMAL LOW (ref >=50)
LDL Cholesterol (Calc): 127 mg/dL (calc) — ABNORMAL HIGH
Non-HDL Cholesterol (Calc): 152 mg/dL (calc) — ABNORMAL HIGH (ref <130)
Total CHOL/HDL Ratio: 4.8 (calc) (ref <5.0)
Triglycerides: 141 mg/dL (ref <150)

## 2020-03-22 LAB — COMPREHENSIVE METABOLIC PANEL
AG Ratio: 1.2 (calc) (ref 1.0–2.5)
ALT: 6 U/L (ref 6–29)
AST: 10 U/L (ref 10–35)
Albumin: 3.9 g/dL (ref 3.6–5.1)
Alkaline phosphatase (APISO): 105 U/L (ref 37–153)
BUN/Creatinine Ratio: 26 (calc) — ABNORMAL HIGH (ref 6–22)
BUN: 37 mg/dL — ABNORMAL HIGH (ref 7–25)
CO2: 26 mmol/L (ref 20–32)
Calcium: 9.6 mg/dL (ref 8.6–10.4)
Chloride: 102 mmol/L (ref 98–110)
Creat: 1.43 mg/dL — ABNORMAL HIGH (ref 0.60–0.93)
Globulin: 3.2 g/dL (calc) (ref 1.9–3.7)
Glucose, Bld: 94 mg/dL (ref 65–99)
Potassium: 4 mmol/L (ref 3.5–5.3)
Sodium: 138 mmol/L (ref 135–146)
Total Bilirubin: 0.4 mg/dL (ref 0.2–1.2)
Total Protein: 7.1 g/dL (ref 6.1–8.1)

## 2020-03-22 LAB — HEMOGLOBIN A1C
Hgb A1c MFr Bld: 6.2 % of total Hgb — ABNORMAL HIGH (ref <5.7)
Mean Plasma Glucose: 131 (calc)
eAG (mmol/L): 7.3 (calc)

## 2020-03-22 LAB — MICROALBUMIN / CREATININE URINE RATIO
Creatinine, Urine: 95 mg/dL (ref 20–275)
Microalb Creat Ratio: 7 mcg/mg creat (ref <30)
Microalb, Ur: 0.7 mg/dL

## 2020-03-22 LAB — VITAMIN B12: Vitamin B-12: 341 pg/mL (ref 200–1100)

## 2020-03-22 LAB — FERRITIN: Ferritin: 55 ng/mL (ref 16–288)

## 2020-03-22 LAB — VITAMIN D 25 HYDROXY (VIT D DEFICIENCY, FRACTURES): Vit D, 25-Hydroxy: 61 ng/mL (ref 30–100)

## 2020-03-22 NOTE — Progress Notes (Signed)
Chronic Care Management Pharmacy Assistant   Name: Doris Jackson  MRN: [id number] DOB: 1941/06/09  Reason for Encounter: Medication Review  PCP : Caren Macadam, MD  Allergies:   Allergies  Allergen Reactions  . Dust Mite Extract Itching and Cough    Medications: Outpatient Encounter Medications as of 03/22/2020  Medication Sig Note  . acetaminophen (TYLENOL) 500 MG tablet Take 500 mg by mouth every 6 (six) hours as needed for mild pain.   Marland Kitchen Alum Hydroxide-Mag Carbonate (GAVISCON EXTRA RELIEF FORMULA) 508-475 MG/10ML SUSP Take by mouth.   Marland Kitchen amLODipine (NORVASC) 10 MG tablet Take 1 tablet (10 mg total) by mouth daily.   . Ascorbic Acid (VITAMIN C) 1000 MG tablet Take 1,000 mg by mouth daily.  (Patient not taking: Reported on 03/22/2020)   . benazepril (LOTENSIN) 40 MG tablet Take 1 tablet (40 mg total) by mouth daily.   . Cholecalciferol (VITAMIN D) 2000 UNITS CAPS Take 4,000 Units by mouth daily.    . cyanocobalamin 2000 MCG tablet Take 2,000 mcg by mouth daily.   (Patient not taking: Reported on 03/22/2020)   . DULoxetine (CYMBALTA) 20 MG capsule Take 1 capsule (20 mg total) by mouth at bedtime.   Marland Kitchen EPINEPHrine 0.3 mg/0.3 mL IJ SOAJ injection SMARTSIG:0.3 Milliliter(s) IM Once PRN   . famotidine (PEPCID) 40 MG tablet Take 40 mg by mouth at bedtime.    Marland Kitchen FERREX 150 150 MG capsule TAKE 1 CAPSULE BY MOUTH EVERY DAY   . fexofenadine (ALLEGRA) 180 MG tablet Take 180 mg by mouth daily as needed for allergies.  (Patient not taking: Reported on 03/22/2020)   . glipiZIDE (GLUCOTROL XL) 5 MG 24 hr tablet TAKE 1 TABLET BY MOUTH EVERY DAY (Patient taking differently: Take 5 mg by mouth daily. )   . Lancets 30G MISC USE TWICE DAILY AS DIRECTED FOR GLUCOSE TESTING AND MONITORING   . metFORMIN (GLUCOPHAGE-XR) 500 MG 24 hr tablet Take 2 tablets (1,000 mg total) by mouth daily with breakfast. (Patient taking differently: Take 500 mg by mouth 2 (two) times daily. )   . metoprolol succinate  (TOPROL-XL) 50 MG 24 hr tablet Take 1 tablet (50 mg total) by mouth daily. Take with or immediately following a meal.   . Multiple Vitamin (MULTIVITAMIN WITH MINERALS) TABS tablet Take 1 tablet by mouth daily. (Patient not taking: Reported on 03/22/2020)   . omeprazole (PRILOSEC) 40 MG capsule Take 1 capsule (40 mg total) by mouth in the morning and at bedtime.   Marland Kitchen ONE TOUCH LANCETS MISC Test twice daily.   Glory Rosebush VERIO test strip TEST TWICE A DAY. DX E11.9   . pioglitazone (ACTOS) 45 MG tablet TAKE 1 TABLET BY MOUTH EVERY DAY (Patient taking differently: Take 45 mg by mouth daily. )   . potassium chloride (MICRO-K) 10 MEQ CR capsule Take 1 capsule BID x 3 days, then take with use of lasix (Patient not taking: Reported on 03/22/2020)   . pravastatin (PRAVACHOL) 40 MG tablet Take 1 tablet (40 mg total) by mouth daily. (Patient taking differently: Take 40 mg by mouth 2 (two) times a week. ) 09/14/2019: Pt said it upsets her stomach so she doesn't take it often.   . prednisoLONE acetate (PRED FORTE) 1 % ophthalmic suspension Place 1 drop into both eyes See admin instructions. Place 1 drop into left eye three times daily Place 1 drop into right eye once daily   . Probiotic Product (PROBIOTIC-10 PO) Take 1 capsule by  mouth daily.  (Patient not taking: Reported on 03/22/2020)   . senna-docusate (SENOKOT-S) 8.6-50 MG tablet Take 1 tablet by mouth at bedtime. (Patient not taking: Reported on 03/22/2020)   . sitaGLIPtin (JANUVIA) 100 MG tablet Take 1 tablet (100 mg total) by mouth daily.   Marland Kitchen spironolactone (ALDACTONE) 25 MG tablet Take 0.5 tablets (12.5 mg total) by mouth daily.    No facility-administered encounter medications on file as of 03/22/2020.    Current Diagnosis: Patient Active Problem List   Diagnosis Date Noted  . 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 Anxiety state   . Acute blood loss anemia   . Postoperative pain   . Cervical myelopathy ([place]) 09/22/2019  . S/P  cervical spinal fusion 09/22/2019  . 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 AKI (acute kidney injury) ([place]) 09/14/2019  . GERD (gastroesophageal reflux disease) 09/14/2019  . Spinal stenosis in cervical region 09/14/2019  . Weakness 09/14/2019  . Numbness and tingling 09/14/2019  . Cervical spinal stenosis 09/14/2019  . Lumbar stenosis 08/24/2019  . Obesity, morbid, BMI 40.0-49.9 ([place]) 04/19/2018  . Cataract   . Fibroid   . Endometriosis   . DM (diabetes mellitus) type II controlled with renal manifestation ([place]) 06/17/2010  . Dyslipidemia 06/17/2010  . Microcytic anemia 06/17/2010  . Essential hypertension 06/17/2010  . Allergic rhinitis 06/17/2010  . Osteoarthritis 06/17/2010  . LOW BACK PAIN 06/17/2010  . OSTEOPENIA 06/17/2010  . BREAST CANCER, HX OF 06/17/2010    Goals Addressed   None     Follow-Up:  Patient Assistance Coordination   Spoke to patient to inform her that we are sending her a Patient assistance form  for Januvia and Advair by mail.Informed patient to include a copy of her proof of income AND a copy of her Explanation of Benefits (EOB) statement from her insurance.Advised patient to return the PAP forms back to the [place] office.  [place] Pharmacist Assistant [phone]

## 2020-03-23 ENCOUNTER — Telehealth: Payer: Self-pay | Admitting: *Deleted

## 2020-03-23 NOTE — Telephone Encounter (Signed)
Patient informed of the message below.

## 2020-03-23 NOTE — Telephone Encounter (Signed)
-----   Message from Caren Macadam, MD sent at 03/23/2020 12:05 PM EDT ----- I talked over rash with another provider - he agrees it appears to be a post inflammatory response -so something caused some irritation and now the darker spots are a response. Typically these will resolve on their own. She could use the topical medication I wrote for her previously which may help with lightening the marks, but I would not expect this rash to cause problems.

## 2020-03-23 NOTE — Addendum Note (Signed)
Addended by: Lahoma Crocker A on: 03/23/2020 11:30 AM   Modules accepted: Orders

## 2020-03-24 DIAGNOSIS — Z1231 Encounter for screening mammogram for malignant neoplasm of breast: Secondary | ICD-10-CM | POA: Diagnosis not present

## 2020-03-26 ENCOUNTER — Telehealth: Payer: Self-pay

## 2020-03-26 NOTE — Progress Notes (Signed)
Chronic Care Management Pharmacy Assistant   Name: Doris Jackson  MRN: [id number] DOB: 1940/11/14  Reason for Encounter: Medication Review/Patient Assistant forms   PCP : Caren Macadam, MD  Allergies:   Allergies  Allergen Reactions  . Dust Mite Extract Itching and Cough    Medications: Outpatient Encounter Medications as of 03/26/2020  Medication Sig Note  . acetaminophen (TYLENOL) 500 MG tablet Take 500 mg by mouth every 6 (six) hours as needed for mild pain.   Marland Kitchen Alum Hydroxide-Mag Carbonate (GAVISCON EXTRA RELIEF FORMULA) 508-475 MG/10ML SUSP Take by mouth.   Marland Kitchen amLODipine (NORVASC) 10 MG tablet Take 1 tablet (10 mg total) by mouth daily.   . Ascorbic Acid (VITAMIN C) 1000 MG tablet Take 1,000 mg by mouth daily.  (Patient not taking: Reported on 03/22/2020)   . benazepril (LOTENSIN) 40 MG tablet Take 1 tablet (40 mg total) by mouth daily.   . Cholecalciferol (VITAMIN D) 2000 UNITS CAPS Take 4,000 Units by mouth daily.    . cyanocobalamin 2000 MCG tablet Take 2,000 mcg by mouth daily.   (Patient not taking: Reported on 03/22/2020)   . DULoxetine (CYMBALTA) 20 MG capsule Take 1 capsule (20 mg total) by mouth at bedtime.   Marland Kitchen EPINEPHrine 0.3 mg/0.3 mL IJ SOAJ injection SMARTSIG:0.3 Milliliter(s) IM Once PRN   . famotidine (PEPCID) 40 MG tablet Take 40 mg by mouth at bedtime.    Marland Kitchen FERREX 150 150 MG capsule TAKE 1 CAPSULE BY MOUTH EVERY DAY   . fexofenadine (ALLEGRA) 180 MG tablet Take 180 mg by mouth daily as needed for allergies.  (Patient not taking: Reported on 03/22/2020)   . glipiZIDE (GLUCOTROL XL) 5 MG 24 hr tablet TAKE 1 TABLET BY MOUTH EVERY DAY (Patient taking differently: Take 5 mg by mouth daily. )   . Lancets 30G MISC USE TWICE DAILY AS DIRECTED FOR GLUCOSE TESTING AND MONITORING   . metFORMIN (GLUCOPHAGE-XR) 500 MG 24 hr tablet Take 2 tablets (1,000 mg total) by mouth daily with breakfast. (Patient taking differently: Take 500 mg by mouth 2 (two) times daily. )   .  metoprolol succinate (TOPROL-XL) 50 MG 24 hr tablet Take 1 tablet (50 mg total) by mouth daily. Take with or immediately following a meal.   . Multiple Vitamin (MULTIVITAMIN WITH MINERALS) TABS tablet Take 1 tablet by mouth daily. (Patient not taking: Reported on 03/22/2020)   . omeprazole (PRILOSEC) 40 MG capsule Take 1 capsule (40 mg total) by mouth in the morning and at bedtime.   Marland Kitchen ONE TOUCH LANCETS MISC Test twice daily.   Glory Rosebush VERIO test strip TEST TWICE A DAY. DX E11.9   . pioglitazone (ACTOS) 45 MG tablet TAKE 1 TABLET BY MOUTH EVERY DAY (Patient taking differently: Take 45 mg by mouth daily. )   . potassium chloride (MICRO-K) 10 MEQ CR capsule Take 1 capsule BID x 3 days, then take with use of lasix (Patient not taking: Reported on 03/22/2020)   . pravastatin (PRAVACHOL) 40 MG tablet Take 1 tablet (40 mg total) by mouth daily. (Patient taking differently: Take 40 mg by mouth 2 (two) times a week. ) 09/14/2019: Pt said it upsets her stomach so she doesn't take it often.   . prednisoLONE acetate (PRED FORTE) 1 % ophthalmic suspension Place 1 drop into both eyes See admin instructions. Place 1 drop into left eye three times daily Place 1 drop into right eye once daily   . Probiotic Product (PROBIOTIC-10 PO) Take  1 capsule by mouth daily.  (Patient not taking: Reported on 03/22/2020)   . senna-docusate (SENOKOT-S) 8.6-50 MG tablet Take 1 tablet by mouth at bedtime. (Patient not taking: Reported on 03/22/2020)   . sitaGLIPtin (JANUVIA) 100 MG tablet Take 1 tablet (100 mg total) by mouth daily.   Marland Kitchen spironolactone (ALDACTONE) 25 MG tablet Take 0.5 tablets (12.5 mg total) by mouth daily.    No facility-administered encounter medications on file as of 03/26/2020.    Current Diagnosis: Patient Active Problem List   Diagnosis Date Noted  . 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 Anxiety state   . Acute blood loss anemia   . Postoperative pain   . Cervical myelopathy  ([name]) 09/22/2019  . S/P cervical spinal fusion 09/22/2019  . 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 AKI (acute kidney injury) ([name]) 09/14/2019  . GERD (gastroesophageal reflux disease) 09/14/2019  . Spinal stenosis in cervical region 09/14/2019  . Weakness 09/14/2019  . Numbness and tingling 09/14/2019  . Cervical spinal stenosis 09/14/2019  . Lumbar stenosis 08/24/2019  . Obesity, morbid, BMI 40.0-49.9 ([name]) 04/19/2018  . Cataract   . Fibroid   . Endometriosis   . DM (diabetes mellitus) type II controlled with renal manifestation ([name]) 06/17/2010  . Dyslipidemia 06/17/2010  . Microcytic anemia 06/17/2010  . Essential hypertension 06/17/2010  . Allergic rhinitis 06/17/2010  . Osteoarthritis 06/17/2010  . LOW BACK PAIN 06/17/2010  . OSTEOPENIA 06/17/2010  . BREAST CANCER, HX OF 06/17/2010     Follow-Up:  Patient Assistance Coordination   Spoke with patient to help her complete the patient assistance forms for Januvia and Advair.  [place] Pharmacist Assistant [phone]

## 2020-03-27 ENCOUNTER — Other Ambulatory Visit: Payer: Self-pay | Admitting: Family Medicine

## 2020-04-03 ENCOUNTER — Other Ambulatory Visit: Payer: Medicare HMO

## 2020-04-03 ENCOUNTER — Other Ambulatory Visit: Payer: Self-pay

## 2020-04-03 ENCOUNTER — Telehealth: Payer: Self-pay | Admitting: Family Medicine

## 2020-04-03 DIAGNOSIS — J3089 Other allergic rhinitis: Secondary | ICD-10-CM | POA: Diagnosis not present

## 2020-04-03 DIAGNOSIS — J301 Allergic rhinitis due to pollen: Secondary | ICD-10-CM | POA: Diagnosis not present

## 2020-04-03 DIAGNOSIS — R944 Abnormal results of kidney function studies: Secondary | ICD-10-CM | POA: Diagnosis not present

## 2020-04-03 NOTE — Telephone Encounter (Signed)
Patient mailed [place] form  When completed fax to: [phone]  Disposition: Dr's Folder

## 2020-04-04 LAB — BASIC METABOLIC PANEL
BUN/Creatinine Ratio: 23 (calc) — ABNORMAL HIGH (ref 6–22)
BUN: 35 mg/dL — ABNORMAL HIGH (ref 7–25)
CO2: 28 mmol/L (ref 20–32)
Calcium: 9.7 mg/dL (ref 8.6–10.4)
Chloride: 103 mmol/L (ref 98–110)
Creat: 1.5 mg/dL — ABNORMAL HIGH (ref 0.60–0.93)
Glucose, Bld: 99 mg/dL (ref 65–99)
Potassium: 4.5 mmol/L (ref 3.5–5.3)
Sodium: 139 mmol/L (ref 135–146)

## 2020-04-06 ENCOUNTER — Other Ambulatory Visit: Payer: Self-pay | Admitting: Family Medicine

## 2020-04-06 MED ORDER — SITAGLIPTIN PHOSPHATE 100 MG PO TABS
100.0000 mg | ORAL_TABLET | Freq: Every day | ORAL | 3 refills | Status: DC
Start: 2020-04-06 — End: 2020-09-27

## 2020-04-06 NOTE — Telephone Encounter (Signed)
I completed my part and printed rx to include, but looks like she still needs to sign one page and include financials/proof of money spent on out of pocket meds? We can run this by Cristie Hem to review in case I'm missing something and she does not need to include those marked items.

## 2020-04-08 ENCOUNTER — Other Ambulatory Visit: Payer: Self-pay | Admitting: Internal Medicine

## 2020-04-09 NOTE — Telephone Encounter (Signed)
Spoke with the pt and informed her the forms were left at the front desk for her to complete her part and Cristie Hem is not in the office today.

## 2020-04-12 ENCOUNTER — Telehealth: Payer: Self-pay | Admitting: Family Medicine

## 2020-04-12 NOTE — Telephone Encounter (Signed)
PT is not able to pick up paperwork. She would like it to be mailed

## 2020-04-14 NOTE — Telephone Encounter (Signed)
Paperwork for [employer] Patient Assistance program and Rx for Sitagliptin was mailed to the pts home address as requested below.

## 2020-04-18 DIAGNOSIS — R059 Cough, unspecified: Secondary | ICD-10-CM | POA: Diagnosis not present

## 2020-04-18 DIAGNOSIS — K219 Gastro-esophageal reflux disease without esophagitis: Secondary | ICD-10-CM | POA: Diagnosis not present

## 2020-04-18 DIAGNOSIS — J3089 Other allergic rhinitis: Secondary | ICD-10-CM | POA: Diagnosis not present

## 2020-04-18 DIAGNOSIS — T781XXD Other adverse food reactions, not elsewhere classified, subsequent encounter: Secondary | ICD-10-CM | POA: Diagnosis not present

## 2020-04-22 ENCOUNTER — Other Ambulatory Visit: Payer: Self-pay | Admitting: Family Medicine

## 2020-04-23 ENCOUNTER — Telehealth: Payer: Self-pay | Admitting: Family Medicine

## 2020-04-23 ENCOUNTER — Other Ambulatory Visit: Payer: Self-pay | Admitting: Family Medicine

## 2020-04-24 DIAGNOSIS — R053 Chronic cough: Secondary | ICD-10-CM | POA: Diagnosis not present

## 2020-04-24 DIAGNOSIS — R490 Dysphonia: Secondary | ICD-10-CM | POA: Diagnosis not present

## 2020-04-24 DIAGNOSIS — K219 Gastro-esophageal reflux disease without esophagitis: Secondary | ICD-10-CM | POA: Diagnosis not present

## 2020-04-24 DIAGNOSIS — R1310 Dysphagia, unspecified: Secondary | ICD-10-CM | POA: Diagnosis not present

## 2020-04-24 DIAGNOSIS — R112 Nausea with vomiting, unspecified: Secondary | ICD-10-CM | POA: Diagnosis not present

## 2020-04-25 DIAGNOSIS — J301 Allergic rhinitis due to pollen: Secondary | ICD-10-CM | POA: Diagnosis not present

## 2020-04-25 DIAGNOSIS — J3089 Other allergic rhinitis: Secondary | ICD-10-CM | POA: Diagnosis not present

## 2020-04-30 ENCOUNTER — Other Ambulatory Visit: Payer: Self-pay | Admitting: Family Medicine

## 2020-05-09 DIAGNOSIS — R69 Illness, unspecified: Secondary | ICD-10-CM | POA: Diagnosis not present

## 2020-05-10 ENCOUNTER — Other Ambulatory Visit: Payer: Self-pay | Admitting: Family Medicine

## 2020-05-10 ENCOUNTER — Telehealth: Payer: Self-pay | Admitting: Family Medicine

## 2020-05-10 NOTE — Telephone Encounter (Signed)
VM FULL. Pt due to schedule Medicare Annual Wellness Visit (AWV) either virtually or in office.  Last AWV 02/26/16; please schedule at anytime with [facility] Nurse Health Advisor 2.  This should be a 45 minute visit.

## 2020-05-14 ENCOUNTER — Ambulatory Visit: Payer: Medicare HMO

## 2020-05-15 ENCOUNTER — Other Ambulatory Visit: Payer: Self-pay | Admitting: Family Medicine

## 2020-05-15 ENCOUNTER — Telehealth: Payer: Self-pay

## 2020-05-15 ENCOUNTER — Telehealth: Payer: Self-pay | Admitting: Family Medicine

## 2020-05-15 MED ORDER — PRAVASTATIN SODIUM 40 MG PO TABS
40.0000 mg | ORAL_TABLET | Freq: Every day | ORAL | 3 refills | Status: DC
Start: 2020-05-15 — End: 2021-05-31

## 2020-05-15 NOTE — Telephone Encounter (Signed)
done

## 2020-05-15 NOTE — Telephone Encounter (Signed)
Pt call and need a refill on pravastatin (PRAVACHOL) 40 MG tablet sent to  [id number] - [place], [place] Phone:  [phone]  Fax:  [phone]

## 2020-05-15 NOTE — Progress Notes (Signed)
Chronic Care Management Pharmacy Assistant   Name: Doris Jackson  MRN: [id number] DOB: 04-Nov-1940  Reason for Encounter: Medication Review/Patient Assistance forms for Januvia and Advair   PCP : Caren Macadam, MD  Allergies:   Allergies  Allergen Reactions  . Dust Mite Extract Itching and Cough    Medications: Outpatient Encounter Medications as of 05/15/2020  Medication Sig Note  . acetaminophen (TYLENOL) 500 MG tablet Take 500 mg by mouth every 6 (six) hours as needed for mild pain.   Marland Kitchen ADVAIR DISKUS 250-50 MCG/DOSE AEPB Inhale 1 puff into the lungs 2 (two) times daily.   . Alum Hydroxide-Mag Carbonate (GAVISCON EXTRA RELIEF FORMULA) 508-475 MG/10ML SUSP Take by mouth.   Marland Kitchen amLODipine (NORVASC) 10 MG tablet TAKE 1 TABLET BY MOUTH EVERY DAY   . Ascorbic Acid (VITAMIN C) 1000 MG tablet Take 1,000 mg by mouth daily.  (Patient not taking: Reported on 03/22/2020)   . benazepril (LOTENSIN) 40 MG tablet Take 1 tablet (40 mg total) by mouth daily.   . Cholecalciferol (VITAMIN D) 2000 UNITS CAPS Take 4,000 Units by mouth daily.    . cyanocobalamin 2000 MCG tablet Take 2,000 mcg by mouth daily.   (Patient not taking: Reported on 03/22/2020)   . DULoxetine (CYMBALTA) 20 MG capsule TAKE 1 CAPSULE (20 MG TOTAL) BY MOUTH AT BEDTIME.   Marland Kitchen EPINEPHrine 0.3 mg/0.3 mL IJ SOAJ injection SMARTSIG:0.3 Milliliter(s) IM Once PRN   . famotidine (PEPCID) 40 MG tablet Take 40 mg by mouth at bedtime.    Marland Kitchen FERREX 150 150 MG capsule TAKE 1 CAPSULE BY MOUTH EVERY DAY   . fexofenadine (ALLEGRA) 180 MG tablet Take 180 mg by mouth daily as needed for allergies.  (Patient not taking: Reported on 03/22/2020)   . fluticasone (FLONASE) 50 MCG/ACT nasal spray Place 1 spray into both nostrils daily.   Marland Kitchen glipiZIDE (GLUCOTROL XL) 5 MG 24 hr tablet TAKE 1 TABLET BY MOUTH EVERY DAY   . Lancets 30G MISC USE TWICE DAILY AS DIRECTED FOR GLUCOSE TESTING AND MONITORING   . metFORMIN (GLUCOPHAGE-XR) 500 MG 24 hr tablet Take  2 tablets (1,000 mg total) by mouth daily with breakfast. (Patient taking differently: Take 500 mg by mouth 2 (two) times daily. )   . metoprolol succinate (TOPROL-XL) 50 MG 24 hr tablet TAKE 1 TABLET BY MOUTH EVERY DAY   . Multiple Vitamin (MULTIVITAMIN WITH MINERALS) TABS tablet Take 1 tablet by mouth daily. (Patient not taking: Reported on 03/22/2020)   . omeprazole (PRILOSEC) 40 MG capsule Take 1 capsule (40 mg total) by mouth in the morning and at bedtime.   Marland Kitchen ONE TOUCH LANCETS MISC Test twice daily.   Glory Rosebush VERIO test strip TEST TWICE A DAY. DX E11.9   . pioglitazone (ACTOS) 45 MG tablet TAKE 1 TABLET BY MOUTH EVERY DAY   . potassium chloride (MICRO-K) 10 MEQ CR capsule TAKE 1 CAPSULE TWICE A DAY X 3 DAYS, THEN TAKE WITH USE OF LASIX   . pravastatin (PRAVACHOL) 40 MG tablet Take 1 tablet (40 mg total) by mouth daily. (Patient taking differently: Take 40 mg by mouth 2 (two) times a week. ) 09/14/2019: Pt said it upsets her stomach so she doesn't take it often.   . prednisoLONE acetate (PRED FORTE) 1 % ophthalmic suspension Place 1 drop into both eyes See admin instructions. Place 1 drop into left eye three times daily Place 1 drop into right eye once daily   . Probiotic Product (  PROBIOTIC-10 PO) Take 1 capsule by mouth daily.  (Patient not taking: Reported on 03/22/2020)   . senna-docusate (SENOKOT-S) 8.6-50 MG tablet Take 1 tablet by mouth at bedtime. (Patient not taking: Reported on 03/22/2020)   . sitaGLIPtin (JANUVIA) 100 MG tablet Take 1 tablet (100 mg total) by mouth daily.   Marland Kitchen spironolactone (ALDACTONE) 25 MG tablet Take 0.5 tablets (12.5 mg total) by mouth daily.    No facility-administered encounter medications on file as of 05/15/2020.    Current Diagnosis: Patient Active Problem List   Diagnosis Date Noted  . 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 Anxiety state   . Acute blood loss anemia   . Postoperative pain   . Cervical myelopathy ([place])  09/22/2019  . S/P cervical spinal fusion 09/22/2019  . 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 AKI (acute kidney injury) ([place]) 09/14/2019  . GERD (gastroesophageal reflux disease) 09/14/2019  . Spinal stenosis in cervical region 09/14/2019  . Weakness 09/14/2019  . Numbness and tingling 09/14/2019  . Cervical spinal stenosis 09/14/2019  . Lumbar stenosis 08/24/2019  . Obesity, morbid, BMI 40.0-49.9 ([place]) 04/19/2018  . Cataract   . Fibroid   . Endometriosis   . DM (diabetes mellitus) type II controlled with renal manifestation ([place]) 06/17/2010  . Dyslipidemia 06/17/2010  . Microcytic anemia 06/17/2010  . Essential hypertension 06/17/2010  . Allergic rhinitis 06/17/2010  . Osteoarthritis 06/17/2010  . LOW BACK PAIN 06/17/2010  . OSTEOPENIA 06/17/2010  . BREAST CANCER, HX OF 06/17/2010    Follow-Up:  Patient Assistance Coordination   Spoke with [employer] and [place] to check the status of the [employer] and [place] they have not receive patient assistance form for either.  Spoke with patient to inform her of the above , patient states she drop them off at [place] for them to mail it to the company.   Informed the clinical pharmacist of the above. [place] Pharmacist Assistant [phone]

## 2020-05-16 ENCOUNTER — Ambulatory Visit (INDEPENDENT_AMBULATORY_CARE_PROVIDER_SITE_OTHER): Payer: Medicare HMO | Admitting: Otolaryngology

## 2020-05-18 DIAGNOSIS — J3089 Other allergic rhinitis: Secondary | ICD-10-CM | POA: Diagnosis not present

## 2020-05-18 DIAGNOSIS — J301 Allergic rhinitis due to pollen: Secondary | ICD-10-CM | POA: Diagnosis not present

## 2020-05-23 ENCOUNTER — Telehealth: Payer: Self-pay | Admitting: Pharmacist

## 2020-05-23 ENCOUNTER — Other Ambulatory Visit: Payer: Self-pay | Admitting: Family Medicine

## 2020-05-23 MED ORDER — ADVAIR DISKUS 250-50 MCG/DOSE IN AEPB: 1.0000 | INHALATION_SPRAY | Freq: Two times a day (BID) | RESPIRATORY_TRACT | 3 refills | Status: DC

## 2020-05-23 NOTE — Progress Notes (Signed)
Spoke with patient regarding patient assistance forms, Patient states her and her husband brought the forms back in the same week and gave it to the front desk.  [place] Pharmacist Assistant [phone]

## 2020-05-23 NOTE — Telephone Encounter (Signed)
-----   Message from Viona Gilmore, [facility] sent at 05/21/2020  3:00 PM EST ----- Regarding: FW: Patient Assistance forms for Januvia and Advair Hi,  Please see below. Did [name] say when she dropped off the forms? Was it recently?  Thanks, Maddie ----- Message ----- From: Caren Macadam, MD Sent: 05/18/2020   1:31 PM EST To: Viona Gilmore, RPH Subject: RE: Patient Assistance forms for Januvia and#  No I haven't. Her husband brought them in before but she hadn't filled out the couple of signatures/info areas I needed her to do so he brought them back with him. I haven't seen them again since then (although I have a stack of papers so I'll let you know if I find them!) - did she tell you date she dropped them off? ----- Message ----- From: Viona Gilmore, Rosa Sanchez: 05/16/2020   4:50 PM EST To: Caren Macadam, MD Subject: FW: Patient Assistance forms for Januvia and#  Hi,  Have you come across any forms for [name] for patient assistance for Januvia and Advair? She states she dropped them off at the office but I wasn't sure if they made it to you or not and I haven't seen them.  Thanks, Maddie ----- Message ----- From: Winnifred Friar D Sent: 05/16/2020   2:02 PM EST To: Viona Gilmore, RPH Subject: Patient Assistance forms for Januvia and Adv#  Spoke with [employer] and [place] to check the status of the [employer] and [place] they have not receive patient assistance form for either.  Spoke with patient to inform her of the above , patient states she drop them off at [place] for them to mail it to the company.

## 2020-06-05 DIAGNOSIS — E119 Type 2 diabetes mellitus without complications: Secondary | ICD-10-CM | POA: Diagnosis not present

## 2020-06-05 DIAGNOSIS — Z0100 Encounter for examination of eyes and vision without abnormal findings: Secondary | ICD-10-CM | POA: Diagnosis not present

## 2020-06-05 LAB — HM DIABETES EYE EXAM

## 2020-06-06 DIAGNOSIS — J3089 Other allergic rhinitis: Secondary | ICD-10-CM | POA: Diagnosis not present

## 2020-06-06 DIAGNOSIS — J301 Allergic rhinitis due to pollen: Secondary | ICD-10-CM | POA: Diagnosis not present

## 2020-06-07 NOTE — Telephone Encounter (Signed)
Referral issue

## 2020-06-12 DIAGNOSIS — Z947 Corneal transplant status: Secondary | ICD-10-CM | POA: Diagnosis not present

## 2020-06-12 DIAGNOSIS — H18513 Endothelial corneal dystrophy, bilateral: Secondary | ICD-10-CM | POA: Diagnosis not present

## 2020-06-13 ENCOUNTER — Telehealth: Payer: Self-pay | Admitting: Family Medicine

## 2020-06-13 ENCOUNTER — Other Ambulatory Visit: Payer: Self-pay

## 2020-06-13 NOTE — Telephone Encounter (Signed)
Patient is calling and wanted to speak to someone regarding medication amlodipine , please advise. CB is [phone]

## 2020-06-14 ENCOUNTER — Encounter: Payer: Self-pay | Admitting: Family Medicine

## 2020-06-14 ENCOUNTER — Ambulatory Visit (INDEPENDENT_AMBULATORY_CARE_PROVIDER_SITE_OTHER): Payer: Medicare HMO | Admitting: Family Medicine

## 2020-06-14 ENCOUNTER — Other Ambulatory Visit: Payer: Self-pay

## 2020-06-14 VITALS — BP 120/62 | HR 94 | Temp 97.6°F | Wt 217.4 lb

## 2020-06-14 DIAGNOSIS — I10 Essential (primary) hypertension: Secondary | ICD-10-CM

## 2020-06-14 NOTE — Patient Instructions (Addendum)
Continue to hold amlodipine (Norvasc) 10 mg in the evening. Continue checking your blood pressure each morning and in the evening.  Write down the readings so that you can bring them to your next appointment.  It would also be helpful to bring your blood pressure monitor with you to your next appointment.  If your blood pressure is consistently greater than 130/90, contact the clinic.  It is important that you increase your intake of water.  Try drinking at least three to four 16.9 oz bottles of water per day Managing Your Hypertension Hypertension is commonly called high blood pressure. This is when the force of your blood pressing against the walls of your arteries is too strong. Arteries are blood vessels that carry blood from your heart throughout your body. Hypertension forces the heart to work harder to pump blood, and may cause the arteries to become narrow or stiff. Having untreated or uncontrolled hypertension can cause heart attack, stroke, kidney disease, and other problems. What are blood pressure readings? A blood pressure reading consists of a higher number over a lower number. Ideally, your blood pressure should be below 120/80. The first ("top") number is called the systolic pressure. It is a measure of the pressure in your arteries as your heart beats. The second ("bottom") number is called the diastolic pressure. It is a measure of the pressure in your arteries as the heart relaxes. What does my blood pressure reading mean? Blood pressure is classified into four stages. Based on your blood pressure reading, your health care provider may use the following stages to determine what type of treatment you need, if any. Systolic pressure and diastolic pressure are measured in a unit called mm Hg. Normal  Systolic pressure: below 017.  Diastolic pressure: below 80. Elevated  Systolic pressure: 793-903.  Diastolic pressure: below 80. Hypertension stage 1  Systolic pressure:  009-233.  Diastolic pressure: 00-76. Hypertension stage 2  Systolic pressure: 226 or above.  Diastolic pressure: 90 or above. What health risks are associated with hypertension? Managing your hypertension is an important responsibility. Uncontrolled hypertension can lead to:  A heart attack.  A stroke.  A weakened blood vessel (aneurysm).  Heart failure.  Kidney damage.  Eye damage.  Metabolic syndrome.  Memory and concentration problems. What changes can I make to manage my hypertension? Hypertension can be managed by making lifestyle changes and possibly by taking medicines. Your health care provider will help you make a plan to bring your blood pressure within a normal range. Eating and drinking   Eat a diet that is high in fiber and potassium, and low in salt (sodium), added sugar, and fat. An example eating plan is called the DASH (Dietary Approaches to Stop Hypertension) diet. To eat this way: ? Eat plenty of fresh fruits and vegetables. Try to fill half of your plate at each meal with fruits and vegetables. ? Eat whole grains, such as whole wheat pasta, brown rice, or whole grain bread. Fill about one quarter of your plate with whole grains. ? Eat low-fat diary products. ? Avoid fatty cuts of meat, processed or cured meats, and poultry with skin. Fill about one quarter of your plate with lean proteins such as fish, chicken without skin, beans, eggs, and tofu. ? Avoid premade and processed foods. These tend to be higher in sodium, added sugar, and fat.  Reduce your daily sodium intake. Most people with hypertension should eat less than 1,500 mg of sodium a day.  Limit alcohol intake  to no more than 1 drink a day for nonpregnant women and 2 drinks a day for men. One drink equals 12 oz of beer, 5 oz of wine, or 1 oz of hard liquor. Lifestyle  Work with your health care provider to maintain a healthy body weight, or to lose weight. Ask what an ideal weight is for  you.  Get at least 30 minutes of exercise that causes your heart to beat faster (aerobic exercise) most days of the week. Activities may include walking, swimming, or biking.  Include exercise to strengthen your muscles (resistance exercise), such as weight lifting, as part of your weekly exercise routine. Try to do these types of exercises for 30 minutes at least 3 days a week.  Do not use any products that contain nicotine or tobacco, such as cigarettes and e-cigarettes. If you need help quitting, ask your health care provider.  Control any long-term (chronic) conditions you have, such as high cholesterol or diabetes. Monitoring  Monitor your blood pressure at home as told by your health care provider. Your personal target blood pressure may vary depending on your medical conditions, your age, and other factors.  Have your blood pressure checked regularly, as often as told by your health care provider. Working with your health care provider  Review all the medicines you take with your health care provider because there may be side effects or interactions.  Talk with your health care provider about your diet, exercise habits, and other lifestyle factors that may be contributing to hypertension.  Visit your health care provider regularly. Your health care provider can help you create and adjust your plan for managing hypertension. Will I need medicine to control my blood pressure? Your health care provider may prescribe medicine if lifestyle changes are not enough to get your blood pressure under control, and if:  Your systolic blood pressure is 130 or higher.  Your diastolic blood pressure is 80 or higher. Take medicines only as told by your health care provider. Follow the directions carefully. Blood pressure medicines must be taken as prescribed. The medicine does not work as well when you skip doses. Skipping doses also puts you at risk for problems. Contact a health care provider  if:  You think you are having a reaction to medicines you have taken.  You have repeated (recurrent) headaches.  You feel dizzy.  You have swelling in your ankles.  You have trouble with your vision. Get help right away if:  You develop a severe headache or confusion.  You have unusual weakness or numbness, or you feel faint.  You have severe pain in your chest or abdomen.  You vomit repeatedly.  You have trouble breathing. Summary  Hypertension is when the force of blood pumping through your arteries is too strong. If this condition is not controlled, it may put you at risk for serious complications.  Your personal target blood pressure may vary depending on your medical conditions, your age, and other factors. For most people, a normal blood pressure is less than 120/80.  Hypertension is managed by lifestyle changes, medicines, or both. Lifestyle changes include weight loss, eating a healthy, low-sodium diet, exercising more, and limiting alcohol. This information is not intended to replace advice given to you by your health care provider. Make sure you discuss any questions you have with your health care provider. Document Revised: 10/08/2018 Document Reviewed: 05/14/2016 Elsevier Patient Education  [place].  Hypotension As your heart beats, it forces blood through  your body. This force is called blood pressure. If you have hypotension, you have low blood pressure. When your blood pressure is too low, you may not get enough blood to your brain or other parts of your body. This may cause you to feel weak, light-headed, have a fast heartbeat, or even pass out (faint). Low blood pressure may be harmless, or it may cause serious problems. What are the causes?  Blood loss.  Not enough water in the body (dehydration).  Heart problems.  Hormone problems.  Pregnancy.  A very bad infection.  Not having enough of certain nutrients.  Very bad allergic  reactions.  Certain medicines. What increases the risk?  Age. The risk increases as you get older.  Conditions that affect the heart or the brain and spinal cord (central nervous system).  Taking certain medicines.  Being pregnant. What are the signs or symptoms?  Feeling: ? Weak. ? Light-headed. ? Dizzy. ? Tired (fatigued).  Blurred vision.  Fast heartbeat.  Passing out, in very bad cases. How is this treated?  Changing your diet. This may involve eating more salt (sodium) or drinking more water.  Taking medicines to raise your blood pressure.  Changing how much you take (the dosage) of some of your medicines.  Wearing compression stockings. These stockings help to prevent blood clots and reduce swelling in your legs. In some cases, you may need to go to the hospital for:  Fluid replacement. This means you will receive fluids through an IV tube.  Blood replacement. This means you will receive donated blood through an IV tube (transfusion).  Treating an infection or heart problems, if this applies.  Monitoring. You may need to be monitored while medicines that you are taking wear off. Follow these instructions at home: Eating and drinking   Drink enough fluids to keep your pee (urine) pale yellow.  Eat a healthy diet. Follow instructions from your doctor about what you can eat or drink. A healthy diet includes: ? Fresh fruits and vegetables. ? Whole grains. ? Low-fat (lean) meats. ? Low-fat dairy products.  Eat extra salt only as told. Do not add extra salt to your diet unless your doctor tells you to.  Eat small meals often.  Avoid standing up quickly after you eat. Medicines  Take over-the-counter and prescription medicines only as told by your doctor. ? Follow instructions from your doctor about changing how much you take of your medicines, if this applies. ? Do not stop or change any of your medicines on your own. General instructions   Wear  compression stockings as told by your doctor.  Get up slowly from lying down or sitting.  Avoid hot showers and a lot of heat as told by your doctor.  Return to your normal activities as told by your doctor. Ask what activities are safe for you.  Do not use any products that contain nicotine or tobacco, such as cigarettes, e-cigarettes, and chewing tobacco. If you need help quitting, ask your doctor.  Keep all follow-up visits as told by your doctor. This is important. Contact a doctor if:  You throw up (vomit).  You have watery poop (diarrhea).  You have a fever for more than 2-3 days.  You feel more thirsty than normal.  You feel weak and tired. Get help right away if:  You have chest pain.  You have a fast or uneven heartbeat.  You lose feeling (have numbness) in any part of your body.  You cannot move  your arms or your legs.  You have trouble talking.  You get sweaty or feel light-headed.  You pass out.  You have trouble breathing.  You have trouble staying awake.  You feel mixed up (confused). Summary  Hypotension is also called low blood pressure. It is when the force of blood pumping through your arteries is too weak.  Hypotension may be harmless, or it may cause serious problems.  Treatment may include changing your diet and medicines, and wearing compression stockings.  In very bad cases, you may need to go to the hospital. This information is not intended to replace advice given to you by your health care provider. Make sure you discuss any questions you have with your health care provider. Document Revised: 12/10/2017 Document Reviewed: 12/10/2017 Elsevier Patient Education  2020 [place] for Gastroesophageal Reflux Disease, Adult When you have gastroesophageal reflux disease (GERD), the foods you eat and your eating habits are very important. Choosing the right foods can help ease your discomfort. Think about working with a  nutrition specialist (dietitian) to help you make good choices. What are tips for following this plan?  Meals  Choose healthy foods that are low in fat, such as fruits, vegetables, whole grains, low-fat dairy products, and lean meat, fish, and poultry.  Eat small meals often instead of 3 large meals a day. Eat your meals slowly, and in a place where you are relaxed. Avoid bending over or lying down until 2-3 hours after eating.  Avoid eating meals 2-3 hours before bed.  Avoid drinking a lot of liquid with meals.  Cook foods using methods other than frying. Bake, grill, or broil food instead.  Avoid or limit: ? Chocolate. ? Peppermint or spearmint. ? Alcohol. ? Pepper. ? Black and decaffeinated coffee. ? Black and decaffeinated tea. ? Bubbly (carbonated) soft drinks. ? Caffeinated energy drinks and soft drinks.  Limit high-fat foods such as: ? Fatty meat or fried foods. ? Whole milk, cream, butter, or ice cream. ? Nuts and nut butters. ? Pastries, donuts, and sweets made with butter or shortening.  Avoid foods that cause symptoms. These foods may be different for everyone. Common foods that cause symptoms include: ? Tomatoes. ? Oranges, lemons, and limes. ? Peppers. ? Spicy food. ? Onions and garlic. ? Vinegar. Lifestyle  Maintain a healthy weight. Ask your doctor what weight is healthy for you. If you need to lose weight, work with your doctor to do so safely.  Exercise for at least 30 minutes for 5 or more days each week, or as told by your doctor.  Wear loose-fitting clothes.  Do not smoke. If you need help quitting, ask your doctor.  Sleep with the head of your bed higher than your feet. Use a wedge under the mattress or blocks under the bed frame to raise the head of the bed. Summary  When you have gastroesophageal reflux disease (GERD), food and lifestyle choices are very important in easing your symptoms.  Eat small meals often instead of 3 large meals a  day. Eat your meals slowly, and in a place where you are relaxed.  Limit high-fat foods such as fatty meat or fried foods.  Avoid bending over or lying down until 2-3 hours after eating.  Avoid peppermint and spearmint, caffeine, alcohol, and chocolate. This information is not intended to replace advice given to you by your health care provider. Make sure you discuss any questions you have with your health care provider. Document  Revised: 10/07/2018 Document Reviewed: 07/22/2016 Elsevier Patient Education  [place].

## 2020-06-14 NOTE — Progress Notes (Signed)
Subjective:    Patient ID: Doris Jackson, female    DOB: Jun 09, 1941, 79 y.o.   MRN: [id number]  No chief complaint on file.   HPI Patient was seen today for fluctuating bp.  Pt concerned as her bp was 107/89 one afternoon.  Typically in 120s/60s-70s.  Pt stopped taking norvasc 10 mg for the last 3 nights.  Taking HCTZ 12.5 mg, Toprol XL 50 mg, spironolactone 25 mg qod, and benazepril 40 mg.  May drink 2 bottles of water per day.  Pt endorses feeling dizzy/presyncopal x 2 since Sept.    Pt mentions she is taking januvia 100 mg prn but has not been able to get it 2/2 cost.  Also taking metformin xr 500 mg BID, glipizide xl 5 mg, and actos 45 mg daily.  Past Medical History:  Diagnosis Date  . ALLERGIC RHINITIS 06/17/2010  . ANEMIA-NOS 06/17/2010  . BREAST CANCER, HX OF 06/17/2010  . Cancer [facility]) 2008   right breast  . Cataract   . COPD 06/17/2010  . DIABETES MELLITUS, TYPE II 06/17/2010  . Endometriosis   . Fibroid    FIBROID  . GERD (gastroesophageal reflux disease)   . HYPERLIPIDEMIA 06/17/2010  . HYPERTENSION 06/17/2010  . Leg swelling   . LOW BACK PAIN 06/17/2010  . OSTEOARTHRITIS 06/17/2010  . OSTEOPENIA 06/17/2010  . Weakness     Allergies  Allergen Reactions  . Dust Mite Extract Itching and Cough    ROS General: Denies fever, chills, night sweats, changes in weight, changes in appetite HEENT: Denies headaches, ear pain, changes in vision, rhinorrhea, sore throat CV: Denies CP, palpitations, SOB, orthopnea Pulm: Denies SOB, cough, wheezing GI: Denies abdominal pain, nausea, vomiting, diarrhea, constipation GU: Denies dysuria, hematuria, frequency, vaginal discharge Msk: Denies muscle cramps, joint pains Neuro: Denies weakness, numbness, tingling Skin: Denies rashes, bruising Psych: Denies depression, anxiety, hallucinations     Objective:    Blood pressure 120/62, pulse 94, temperature 97.6 F (36.4 C), temperature source Oral, weight 217 lb 6.4 oz (98.6 kg),  SpO2 96 %.  BP recheck 118/60   Gen. Pleasant, well-nourished, in no distress, normal affect   HEENT: [place]/AT, face symmetric, conjunctiva clear, no scleral icterus, PERRLA, EOMI, nares patent without drainage Lungs: no accessory muscle use, CTAB, no wheezes or rales Cardiovascular: RRR, no m/r/g, no peripheral edema Musculoskeletal: No deformities, no cyanosis or clubbing, normal tone Neuro:  A&Ox3, CN II-XII intact Skin:  Warm, no lesions/ rash   Wt Readings from Last 3 Encounters:  03/21/20 206 lb 8 oz (93.7 kg)  02/03/20 219 lb 3.2 oz (99.4 kg)  01/11/20 218 lb 9.6 oz (99.2 kg)    Lab Results  Component Value Date   WBC 7.4 03/21/2020   HGB 10.2 (L) 03/21/2020   HCT 31.1 (L) 03/21/2020   PLT 173 03/21/2020   GLUCOSE 99 04/03/2020   CHOL 192 03/21/2020   TRIG 141 03/21/2020   HDL 40 (L) 03/21/2020   LDLCALC 127 (H) 03/21/2020   ALT 6 03/21/2020   AST 10 03/21/2020   NA 139 04/03/2020   K 4.5 04/03/2020   CL 103 04/03/2020   CREATININE 1.50 (H) 04/03/2020   BUN 35 (H) 04/03/2020   CO2 28 04/03/2020   TSH 1.20 09/07/2019   INR 1.0 09/15/2019   HGBA1C 6.2 (H) 03/21/2020   MICROALBUR 0.7 03/21/2020    Assessment/Plan:  Essential hypertension -controlled -recheck 118/60 -will continue to hold norvasc 10 mg daily -continue other medications including Toprol XL 50  mg, benazepril 40 mg, spironolactone 25 mg -pt advised to continue checking bp at home and bring a log to clinic.  /pt advised to bring bp cuff to next OFV -increase po intake of water. -given precautions.   -Can restart norvasc 1/2 tab (5 mg) for bp consistently >140/90.  F/u in 2-4 wks with pcp  Grier Mitts, MD

## 2020-06-18 ENCOUNTER — Ambulatory Visit: Payer: Medicare HMO | Admitting: Family Medicine

## 2020-06-18 DIAGNOSIS — R053 Chronic cough: Secondary | ICD-10-CM | POA: Diagnosis not present

## 2020-06-18 NOTE — Telephone Encounter (Signed)
Pt was seen by Dr. Volanda Napoleon 12/16.

## 2020-06-20 IMAGING — RF DG CERVICAL SPINE 1V
1 series · 1 of 1 positions shown · non-contrast
Comparison: None.

CLINICAL DATA: Anterior cervical plate revision

EXAM:
DG CERVICAL SPINE - 1 VIEW; DG C-ARM 1-60 MIN

[Series 1: run · 1 of 1 slices shown]
[im 1/1]
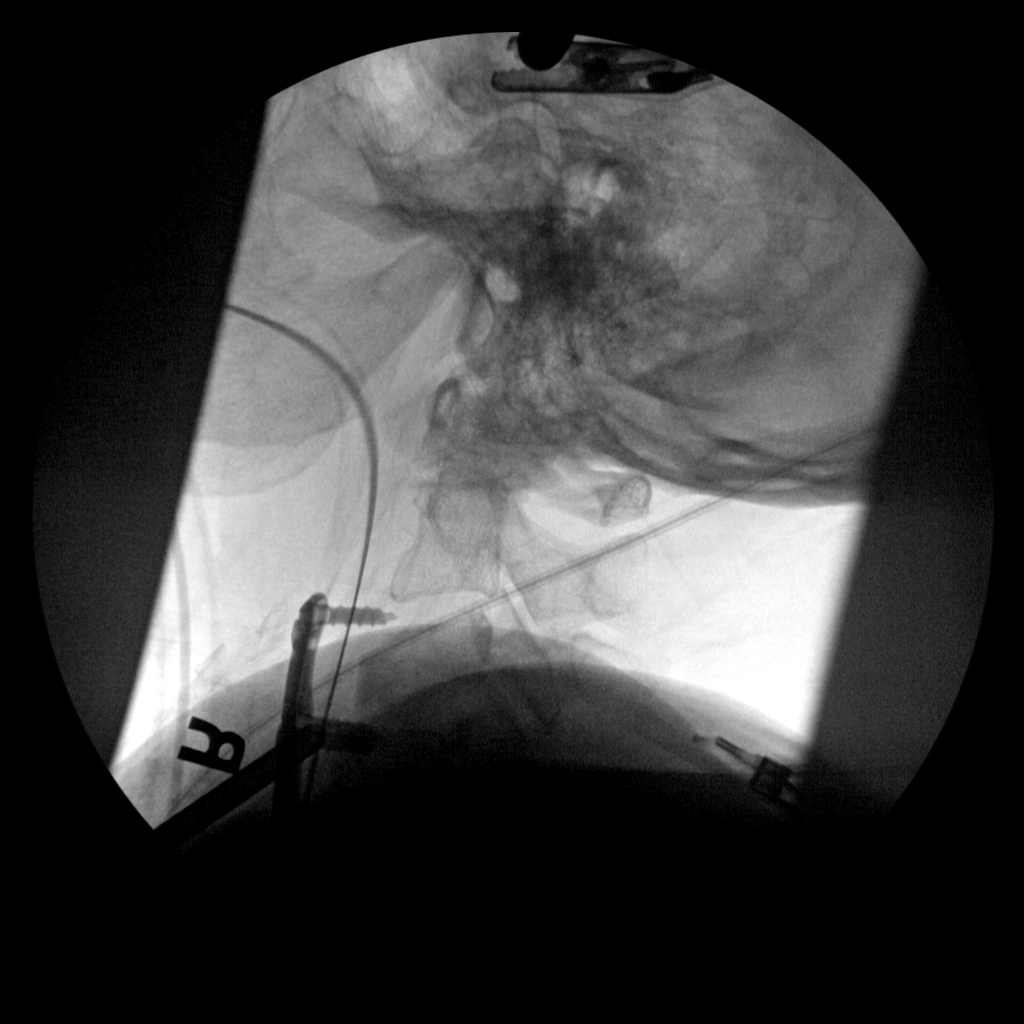

[1 of 1 positions shown; findings below may reference images not displayed]

FINDINGS: Single intraop view of ACDF plate extending from the C2-C3 inter
disc space inferiorly. Fluoro time 20 seconds
IMPRESSION: Single intraop view of ACDF.

## 2020-06-21 ENCOUNTER — Other Ambulatory Visit: Payer: Self-pay | Admitting: Family Medicine

## 2020-06-27 DIAGNOSIS — J3089 Other allergic rhinitis: Secondary | ICD-10-CM | POA: Diagnosis not present

## 2020-06-27 DIAGNOSIS — J301 Allergic rhinitis due to pollen: Secondary | ICD-10-CM | POA: Diagnosis not present

## 2020-06-28 ENCOUNTER — Other Ambulatory Visit: Payer: Self-pay | Admitting: Family Medicine

## 2020-07-11 DIAGNOSIS — J449 Chronic obstructive pulmonary disease, unspecified: Secondary | ICD-10-CM | POA: Diagnosis not present

## 2020-07-11 DIAGNOSIS — E1142 Type 2 diabetes mellitus with diabetic polyneuropathy: Secondary | ICD-10-CM | POA: Diagnosis not present

## 2020-07-11 DIAGNOSIS — H409 Unspecified glaucoma: Secondary | ICD-10-CM | POA: Diagnosis not present

## 2020-07-11 DIAGNOSIS — E785 Hyperlipidemia, unspecified: Secondary | ICD-10-CM | POA: Diagnosis not present

## 2020-07-11 DIAGNOSIS — R69 Illness, unspecified: Secondary | ICD-10-CM | POA: Diagnosis not present

## 2020-07-11 DIAGNOSIS — G8929 Other chronic pain: Secondary | ICD-10-CM | POA: Diagnosis not present

## 2020-07-11 DIAGNOSIS — E1122 Type 2 diabetes mellitus with diabetic chronic kidney disease: Secondary | ICD-10-CM | POA: Diagnosis not present

## 2020-07-11 DIAGNOSIS — E1159 Type 2 diabetes mellitus with other circulatory complications: Secondary | ICD-10-CM | POA: Diagnosis not present

## 2020-07-12 ENCOUNTER — Encounter: Payer: Self-pay | Admitting: Family Medicine

## 2020-07-17 ENCOUNTER — Other Ambulatory Visit: Payer: Self-pay | Admitting: Family Medicine

## 2020-07-18 ENCOUNTER — Other Ambulatory Visit: Payer: Self-pay

## 2020-07-18 ENCOUNTER — Ambulatory Visit (INDEPENDENT_AMBULATORY_CARE_PROVIDER_SITE_OTHER): Payer: Medicare HMO | Admitting: Family Medicine

## 2020-07-18 ENCOUNTER — Ambulatory Visit: Payer: Medicare HMO | Admitting: Family Medicine

## 2020-07-18 ENCOUNTER — Other Ambulatory Visit: Payer: Self-pay | Admitting: Family Medicine

## 2020-07-18 ENCOUNTER — Encounter: Payer: Self-pay | Admitting: Family Medicine

## 2020-07-18 VITALS — BP 128/72 | HR 86 | Ht 62.0 in | Wt 221.0 lb

## 2020-07-18 DIAGNOSIS — D509 Iron deficiency anemia, unspecified: Secondary | ICD-10-CM | POA: Diagnosis not present

## 2020-07-18 DIAGNOSIS — J301 Allergic rhinitis due to pollen: Secondary | ICD-10-CM | POA: Diagnosis not present

## 2020-07-18 DIAGNOSIS — D219 Benign neoplasm of connective and other soft tissue, unspecified: Secondary | ICD-10-CM | POA: Insufficient documentation

## 2020-07-18 DIAGNOSIS — R42 Dizziness and giddiness: Secondary | ICD-10-CM

## 2020-07-18 DIAGNOSIS — J3089 Other allergic rhinitis: Secondary | ICD-10-CM | POA: Diagnosis not present

## 2020-07-18 DIAGNOSIS — I10 Essential (primary) hypertension: Secondary | ICD-10-CM | POA: Diagnosis not present

## 2020-07-18 DIAGNOSIS — J3081 Allergic rhinitis due to animal (cat) (dog) hair and dander: Secondary | ICD-10-CM | POA: Insufficient documentation

## 2020-07-18 NOTE — Progress Notes (Signed)
Established Patient Office Visit  Subjective:  Patient ID: Doris Jackson, female    DOB: 1941/02/02  Age: 80 y.o. MRN: [id number]  CC:  Chief Complaint  Patient presents with  . Hypotension    HPI Doris Jackson presents for recent intermittent lightheadedness.  She has chronic problems including hypertension, GERD, type 2 diabetes, osteoarthritis, what looks like chronic microcytic anemia with recent normal ferritin.  She relates about a month ago she started having lightheadedness fairly consistently with standing.  She is on several blood pressure medications.  On her own, she held her amlodipine 10 mg daily and since that time she has had only rare episodes of dizziness.  Her last hemoglobin in September was 10.2 which was stable.  She does have type 2 diabetes but CBGs have been stable.  No recent hypoglycemic episodes.  Recent A1c 6.2%.  Generally stays well-hydrated.  She does apparently still remain on benazepril, metoprolol, and Aldactone every other day.  Her home blood pressures been very stable usually [id number] systolic and around [id number] to [id number] diastolic.  Past Medical History:  Diagnosis Date  . ALLERGIC RHINITIS 06/17/2010  . ANEMIA-NOS 06/17/2010  . BREAST CANCER, HX OF 06/17/2010  . Cancer [facility]) 2008   right breast  . Cataract   . COPD 06/17/2010  . DIABETES MELLITUS, TYPE II 06/17/2010  . Endometriosis   . Fibroid    FIBROID  . GERD (gastroesophageal reflux disease)   . HYPERLIPIDEMIA 06/17/2010  . HYPERTENSION 06/17/2010  . Leg swelling   . LOW BACK PAIN 06/17/2010  . OSTEOARTHRITIS 06/17/2010  . OSTEOPENIA 06/17/2010  . Weakness     Past Surgical History:  Procedure Laterality Date  . ABDOMINAL HYSTERECTOMY  1988   TAH,LSO  . ANTERIOR CERVICAL DECOMP/DISCECTOMY FUSION N/A 09/15/2019   Procedure: Cervical five CorpectomyANTERIOR CERVICAL DECOMPRESSION/DISCECTOMY FUSION CERVICAL THREE-CERVICAL Four;  Surgeon: Ashok Pall, MD;  Location: [place];  Service:  Neurosurgery;  Laterality: N/A;  . BREAST LUMPECTOMY Right 2007   LUMPECTOMY FOLLOWED BY RADIATION  . COLONOSCOPY    . CORNEAL TRANSPLANT Right 2020  . 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 EYE SURGERY Bilateral    Cataract  . HARDWARE REMOVAL N/A 10/21/2019   Procedure: Anterior cervical plate removal with revision;  Surgeon: Ashok Pall, MD;  Location: [place];  Service: Neurosurgery;  Laterality: N/A;  3C  . [place] Bilateral   . OOPHORECTOMY  1988   TAH,LSO  . TUBAL LIGATION      Family History  Problem Relation Age of Onset  . Diabetes Mother   . Hypertension Sister   . Asthma Father   . Diabetes Brother     Social History   Socioeconomic History  . Marital status: Married    Spouse name: Not on file  . Number of children: Not on file  . Years of education: Not on file  . Highest education level: Not on file  Occupational History  . Not on file  Tobacco Use  . Smoking status: Never Smoker  . Smokeless tobacco: Never Used  Substance and Sexual Activity  . Alcohol use: No  . Drug use: No  . Sexual activity: Never    Birth control/protection: Post-menopausal, Surgical    Comment: Tubal Ligation  Other Topics Concern  . Not on file  Social History Narrative  . Not on file   Social Determinants of Health   Financial Resource Strain: High Risk  . Difficulty of Paying Living Expenses: Hard  Food Insecurity: Not  on file  Transportation Needs: No Transportation Needs  . Lack of Transportation (Medical): No  . Lack of Transportation (Non-Medical): No  Physical Activity: Not on file  Stress: Not on file  Social Connections: Not on file  Intimate Partner Violence: Not on file    Outpatient Medications Prior to Visit  Medication Sig Dispense Refill  . acetaminophen (TYLENOL) 500 MG tablet Take 500 mg by mouth every 6 (six) hours as needed for mild pain.    Marland Kitchen ADVAIR DISKUS 250-50 MCG/DOSE AEPB Inhale 1 puff into the lungs 2 (two) times daily. 3 each 3  . Alum  Hydroxide-Mag Carbonate (GAVISCON EXTRA RELIEF FORMULA) 508-475 MG/10ML SUSP Take by mouth.    Marland Kitchen amLODipine (NORVASC) 10 MG tablet TAKE 1 TABLET BY MOUTH EVERY DAY (Patient not taking: Reported on 07/18/2020) 90 tablet 1  . Ascorbic Acid (VITAMIN C) 1000 MG tablet Take 1,000 mg by mouth daily.  (Patient not taking: Reported on 03/22/2020)    . benazepril (LOTENSIN) 40 MG tablet TAKE 1 TABLET BY MOUTH EVERY DAY 90 tablet 0  . Cholecalciferol (VITAMIN D) 2000 UNITS CAPS Take 4,000 Units by mouth daily.     . cyanocobalamin 2000 MCG tablet Take 2,000 mcg by mouth daily.   (Patient not taking: Reported on 03/22/2020)    . cyclopentolate (CYCLODRYL,CYCLOGYL) 1 % ophthalmic solution Apply to eye.    . DULoxetine (CYMBALTA) 20 MG capsule TAKE 1 CAPSULE (20 MG TOTAL) BY MOUTH AT BEDTIME. 90 capsule 1  . EPINEPHrine 0.3 mg/0.3 mL IJ SOAJ injection SMARTSIG:0.3 Milliliter(s) IM Once PRN    . famotidine (PEPCID) 40 MG tablet Take 40 mg by mouth at bedtime.     Marland Kitchen FERREX 150 150 MG capsule TAKE 1 CAPSULE BY MOUTH EVERY DAY 90 capsule 1  . fexofenadine (ALLEGRA) 180 MG tablet Take 180 mg by mouth daily as needed for allergies.  (Patient not taking: Reported on 03/22/2020)    . fluticasone (FLONASE) 50 MCG/ACT nasal spray Place 1 spray into both nostrils daily.    Marland Kitchen glipiZIDE (GLUCOTROL XL) 5 MG 24 hr tablet TAKE 1 TABLET BY MOUTH EVERY DAY 90 tablet 1  . Lancets 30G MISC USE TWICE DAILY AS DIRECTED FOR GLUCOSE TESTING AND MONITORING 100 each 4  . metFORMIN (GLUCOPHAGE-XR) 500 MG 24 hr tablet Take 2 tablets (1,000 mg total) by mouth daily with breakfast. (Patient taking differently: Take 500 mg by mouth 2 (two) times daily. ) 180 tablet 1  . metoprolol succinate (TOPROL-XL) 50 MG 24 hr tablet TAKE 1 TABLET BY MOUTH EVERY DAY 90 tablet 1  . Multiple Vitamin (MULTIVITAMIN WITH MINERALS) TABS tablet Take 1 tablet by mouth daily. (Patient not taking: Reported on 03/22/2020)    . omeprazole (PRILOSEC) 40 MG capsule TAKE 1  CAPSULE (40 MG TOTAL) BY MOUTH AT BEDTIME. 90 capsule 1  . ONE TOUCH LANCETS MISC Test twice daily. 200 each 5  . ONETOUCH VERIO test strip TEST TWICE A DAY. DX E11.9 200 strip 4  . pioglitazone (ACTOS) 45 MG tablet TAKE 1 TABLET BY MOUTH EVERY DAY 90 tablet 1  . potassium chloride (MICRO-K) 10 MEQ CR capsule TAKE 1 CAPSULE TWICE A DAY X 3 DAYS, THEN TAKE WITH USE OF LASIX 90 capsule 0  . pravastatin (PRAVACHOL) 40 MG tablet Take 1 tablet (40 mg total) by mouth daily. 90 tablet 3  . prednisoLONE acetate (PRED FORTE) 1 % ophthalmic suspension Place 1 drop into both eyes See admin instructions. Place 1 drop into  left eye three times daily Place 1 drop into right eye once daily    . Probiotic Product (PROBIOTIC-10 PO) Take 1 capsule by mouth daily.  (Patient not taking: Reported on 03/22/2020)    . senna-docusate (SENOKOT-S) 8.6-50 MG tablet Take 1 tablet by mouth at bedtime. (Patient not taking: Reported on 03/22/2020) 30 tablet 0  . sitaGLIPtin (JANUVIA) 100 MG tablet Take 1 tablet (100 mg total) by mouth daily. 90 tablet 3  . spironolactone (ALDACTONE) 25 MG tablet Take 0.5 tablets (12.5 mg total) by mouth daily. 45 tablet 1   No facility-administered medications prior to visit.    Allergies  Allergen Reactions  . Dust Mite Extract Itching and Cough    ROS Review of Systems  Constitutional: Negative for fatigue.  Eyes: Negative for visual disturbance.  Respiratory: Negative for cough, chest tightness, shortness of breath and wheezing.   Cardiovascular: Negative for chest pain, palpitations and leg swelling.  Neurological: Negative for seizures, syncope, weakness and headaches.       See HPI      Objective:    Physical Exam Vitals reviewed.  Constitutional:      Appearance: Normal appearance.  Cardiovascular:     Rate and Rhythm: Normal rate and regular rhythm.  Pulmonary:     Effort: Pulmonary effort is normal.     Breath sounds: Normal breath sounds.  Musculoskeletal:      Right lower leg: No edema.     Left lower leg: No edema.  Neurological:     Mental Status: She is alert.     BP 128/72 (BP Location: Left Arm, Cuff Size: Large)   Pulse 86   Ht 5\' 2"  (1.575 m)   Wt 221 lb (100.2 kg)   SpO2 99%   BMI 40.42 kg/m  Wt Readings from Last 3 Encounters:  07/18/20 221 lb (100.2 kg)  06/14/20 217 lb 6.4 oz (98.6 kg)  03/21/20 206 lb 8 oz (93.7 kg)     Health Maintenance Due  Topic Date Due  . TETANUS/TDAP  Never done  . PAP SMEAR-Modifier  09/21/2013  . FOOT EXAM  04/20/2019    There are no preventive care reminders to display for this patient.  Lab Results  Component Value Date   TSH 1.20 09/07/2019   Lab Results  Component Value Date   WBC 7.4 03/21/2020   HGB 10.2 (L) 03/21/2020   HCT 31.1 (L) 03/21/2020   MCV 79.3 (L) 03/21/2020   PLT 173 03/21/2020   Lab Results  Component Value Date   NA 139 04/03/2020   K 4.5 04/03/2020   CO2 28 04/03/2020   GLUCOSE 99 04/03/2020   BUN 35 (H) 04/03/2020   CREATININE 1.50 (H) 04/03/2020   BILITOT 0.4 03/21/2020   ALKPHOS 110 11/16/2019   AST 10 03/21/2020   ALT 6 03/21/2020   PROT 7.1 03/21/2020   ALBUMIN 3.9 11/16/2019   CALCIUM 9.7 04/03/2020   ANIONGAP 10 10/21/2019   GFR 64.16 12/19/2019   Lab Results  Component Value Date   CHOL 192 03/21/2020   Lab Results  Component Value Date   HDL 40 (L) 03/21/2020   Lab Results  Component Value Date   LDLCALC 127 (H) 03/21/2020   Lab Results  Component Value Date   TRIG 141 03/21/2020   Lab Results  Component Value Date   CHOLHDL 4.8 03/21/2020   Lab Results  Component Value Date   HGBA1C 6.2 (H) 03/21/2020      Assessment &  Plan:   Problem List Items Addressed This Visit      Unprioritized   Essential hypertension   Microcytic anemia - Primary    Other Visit Diagnoses    Lightheadedness        Patient relates some recent episodes of intermittent lightheadedness and after she stopped amlodipine on her own about a  month ago her symptoms are improved.  Blood pressure today left arm seated with large cuff 128/72 and standing obtained almost identical reading.  She does have some chronic anemia this appears to be stable by recent labs.  -We recommend she continue to hold amlodipine for now since her blood pressure is very well controlled.  She has follow-up with primary in February to reassess. -Consider follow-up A1c and hemoglobin at 1 month follow-up  No orders of the defined types were placed in this encounter.   Follow-up: No follow-ups on file.    Carolann Littler, MD

## 2020-07-18 NOTE — Patient Instructions (Signed)
Continue to hold the Amlodipine for now  Blood pressure today is stable off that - 128/72    If blood pressure starts to climb back over 140/90 be in follow up with Dr Ethlyn Gallery.

## 2020-07-26 ENCOUNTER — Telehealth: Payer: Self-pay | Admitting: Pharmacist

## 2020-07-27 ENCOUNTER — Telehealth: Payer: Self-pay | Admitting: Family Medicine

## 2020-07-27 ENCOUNTER — Other Ambulatory Visit: Payer: Self-pay | Admitting: Family Medicine

## 2020-07-27 NOTE — Telephone Encounter (Signed)
Pt call back and stated she need her

## 2020-08-01 NOTE — Chronic Care Management (AMB) (Signed)
Chronic Care Management Pharmacy Assistant   Name: Doris Jackson  MRN: [id number] DOB: 1940/09/01  Reason for Encounter: Medication Review  PCP : Caren Macadam, MD  Allergies:   Allergies  Allergen Reactions  . Dust Mite Extract Itching and Cough    Medications: Outpatient Encounter Medications as of 07/26/2020  Medication Sig  . acetaminophen (TYLENOL) 500 MG tablet Take 500 mg by mouth every 6 (six) hours as needed for mild pain.  Marland Kitchen ADVAIR DISKUS 250-50 MCG/DOSE AEPB Inhale 1 puff into the lungs 2 (two) times daily.  . Alum Hydroxide-Mag Carbonate (GAVISCON EXTRA RELIEF FORMULA) 508-475 MG/10ML SUSP Take by mouth.  Marland Kitchen amLODipine (NORVASC) 10 MG tablet TAKE 1 TABLET BY MOUTH EVERY DAY (Patient not taking: Reported on 07/18/2020)  . Ascorbic Acid (VITAMIN C) 1000 MG tablet Take 1,000 mg by mouth daily.  (Patient not taking: Reported on 03/22/2020)  . benazepril (LOTENSIN) 40 MG tablet TAKE 1 TABLET BY MOUTH EVERY DAY  . Cholecalciferol (VITAMIN D) 2000 UNITS CAPS Take 4,000 Units by mouth daily.   . cyanocobalamin 2000 MCG tablet Take 2,000 mcg by mouth daily.   (Patient not taking: Reported on 03/22/2020)  . cyclopentolate (CYCLODRYL,CYCLOGYL) 1 % ophthalmic solution Apply to eye.  . DULoxetine (CYMBALTA) 20 MG capsule TAKE 1 CAPSULE (20 MG TOTAL) BY MOUTH AT BEDTIME.  Marland Kitchen EPINEPHrine 0.3 mg/0.3 mL IJ SOAJ injection SMARTSIG:0.3 Milliliter(s) IM Once PRN  . famotidine (PEPCID) 40 MG tablet Take 40 mg by mouth at bedtime.   Marland Kitchen FERREX 150 150 MG capsule TAKE 1 CAPSULE BY MOUTH EVERY DAY  . fexofenadine (ALLEGRA) 180 MG tablet Take 180 mg by mouth daily as needed for allergies.  (Patient not taking: Reported on 03/22/2020)  . fluticasone (FLONASE) 50 MCG/ACT nasal spray Place 1 spray into both nostrils daily.  Marland Kitchen glipiZIDE (GLUCOTROL XL) 5 MG 24 hr tablet TAKE 1 TABLET BY MOUTH EVERY DAY  . Lancets 30G MISC USE TWICE DAILY AS DIRECTED FOR GLUCOSE TESTING AND MONITORING  . metFORMIN  (GLUCOPHAGE-XR) 500 MG 24 hr tablet Take 2 tablets (1,000 mg total) by mouth daily with breakfast. (Patient taking differently: Take 500 mg by mouth 2 (two) times daily. )  . metoprolol succinate (TOPROL-XL) 50 MG 24 hr tablet TAKE 1 TABLET BY MOUTH EVERY DAY  . Multiple Vitamin (MULTIVITAMIN WITH MINERALS) TABS tablet Take 1 tablet by mouth daily. (Patient not taking: Reported on 03/22/2020)  . omeprazole (PRILOSEC) 40 MG capsule TAKE 1 CAPSULE (40 MG TOTAL) BY MOUTH AT BEDTIME.  Marland Kitchen ONE TOUCH LANCETS MISC Test twice daily.  Glory Rosebush VERIO test strip TEST TWICE A DAY. DX E11.9  . pioglitazone (ACTOS) 45 MG tablet TAKE 1 TABLET BY MOUTH EVERY DAY  . potassium chloride (MICRO-K) 10 MEQ CR capsule TAKE 1 CAPSULE TWICE A DAY X 3 DAYS, THEN TAKE WITH USE OF LASIX  . pravastatin (PRAVACHOL) 40 MG tablet Take 1 tablet (40 mg total) by mouth daily.  . prednisoLONE acetate (PRED FORTE) 1 % ophthalmic suspension Place 1 drop into both eyes See admin instructions. Place 1 drop into left eye three times daily Place 1 drop into right eye once daily  . Probiotic Product (PROBIOTIC-10 PO) Take 1 capsule by mouth daily.  (Patient not taking: Reported on 03/22/2020)  . senna-docusate (SENOKOT-S) 8.6-50 MG tablet Take 1 tablet by mouth at bedtime. (Patient not taking: Reported on 03/22/2020)  . sitaGLIPtin (JANUVIA) 100 MG tablet Take 1 tablet (100 mg total) by mouth  daily.  . spironolactone (ALDACTONE) 25 MG tablet TAKE 1/2 TABLET BY MOUTH EVERY DAY   No facility-administered encounter medications on file as of 07/26/2020.    Current Diagnosis: Patient Active Problem List   Diagnosis Date Noted  . Allergic rhinitis due to animal (cat) (dog) hair and dander 07/18/2020  . Leiomyoma 07/18/2020  . 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 Anxiety state   . Acute blood loss anemia   . Postoperative pain   . Cervical myelopathy ([place]) 09/22/2019  . S/P cervical spinal fusion 09/22/2019  .  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 AKI (acute kidney injury) ([place]) 09/14/2019  . GERD (gastroesophageal reflux disease) 09/14/2019  . Spinal stenosis in cervical region 09/14/2019  . Weakness 09/14/2019  . Numbness and tingling 09/14/2019  . Cervical spinal stenosis 09/14/2019  . Status post cataract extraction and insertion of intraocular lens of left eye 09/04/2019  . Lumbar stenosis 08/24/2019  . Allergic rhinitis due to pollen 06/06/2019  . Persistent cough 06/06/2019  . History of Descemet membrane endothelial keratoplasty (DMEK) 01/04/2019  . Status post cataract extraction and insertion of intraocular lens of right eye 01/04/2019  . Senile nuclear sclerosis 01/02/2019  . 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 Obesity, morbid, BMI 40.0-49.9 ([place]) 04/19/2018  . Morbid obesity ([place]) 04/19/2018  . Cataract   . Fibroid   . Endometriosis   . DM (diabetes mellitus) type II controlled with renal manifestation ([place]) 06/17/2010  . Dyslipidemia 06/17/2010  . Microcytic anemia 06/17/2010  . Essential hypertension 06/17/2010  . Allergic rhinitis 06/17/2010  . Osteoarthritis 06/17/2010  . LOW BACK PAIN 06/17/2010  . OSTEOPENIA 06/17/2010  . BREAST CANCER, HX OF 06/17/2010  . Disorder of skeletal system 06/17/2010    Goals Addressed   None     Reviewed chart prior to disease state call. Spoke with patient regarding BP  Recent Office Vitals: BP Readings from Last 3 Encounters:  07/18/20 128/72  06/14/20 120/62  03/21/20 118/70   Pulse Readings from Last 3 Encounters:  07/18/20 86  06/14/20 94  03/21/20 85    Wt Readings from Last 3 Encounters:  07/18/20 221 lb (100.2 kg)  06/14/20 217 lb 6.4 oz (98.6 kg)  03/21/20 206 lb 8 oz (93.7 kg)     Kidney Function Lab Results  Component Value Date/Time   CREATININE 1.50 (H) 04/03/2020 09:13 AM   CREATININE 1.43 (H) 03/21/2020 11:45 AM    CREATININE 0.8 04/20/2012 11:11 AM   GFR 64.16 12/19/2019 01:04 PM   GFRNONAA >60 10/21/2019 12:50 PM   GFRAA >60 10/21/2019 12:50 PM    BMP Latest Ref Rng & Units 04/03/2020 03/21/2020 12/19/2019  Glucose 65 - 99 mg/dL 99 94 244(H)  BUN 7 - 25 mg/dL 35(H) 37(H) 20  Creatinine 0.60 - 0.93 mg/dL 1.50(H) 1.43(H) 1.01  BUN/Creat Ratio 6 - 22 (calc) 23(H) 26(H) -  Sodium 135 - 146 mmol/L 139 138 138  Potassium 3.5 - 5.3 mmol/L 4.5 4.0 3.7  Chloride 98 - 110 mmol/L 103 102 102  CO2 20 - 32 mmol/L 28 26 27   Calcium 8.6 - 10.4 mg/dL 9.7 9.6 9.1    . Current antihypertensive regimen:  o Metoprolol succinate (TOPROL-XL) 50 mg 24hr tablet daily o Benazepril (LOTENSIN) 40 mg tablets daily . How often are you checking your Blood Pressure? 3-5x per week . Current home BP readings:  o 01/23 108/60 o 01/24 104/66  o 01/27 101/60 . What recent interventions/DTPs have been made by any provider to improve Blood Pressure control since last CPP Visit: None . Any recent hospitalizations or ED visits since last visit with CPP? No . What diet changes have been made to improve Blood Pressure Control?  o No Change . What exercise is being done to improve your Blood Pressure Control?  o No Change  Adherence Review: Is the patient currently on ACE/ARB medication? Yes Does the patient have >5 day gap between last estimated fill dates? No   Follow-Up:  Pharmacist Review  I spoke with the patient and discussed medication adherence with the patient, no issues at this time with current medication. She states that she has been doing well. The patient states that she has a follow-up with her primary care doctor this month.  She stopped her Amlodipine in December due to lightheadedness. Per her last office visit in January, she is to continue to hold this. She denies ED visits since her last CPP follow-up. She denies any side effects from her current medication and denies any problems with her pharmacy.  Maia Breslow, [place] Assistant [phone]

## 2020-08-08 ENCOUNTER — Encounter: Payer: Self-pay | Admitting: Family Medicine

## 2020-08-08 ENCOUNTER — Telehealth (INDEPENDENT_AMBULATORY_CARE_PROVIDER_SITE_OTHER): Payer: Medicare HMO | Admitting: Family Medicine

## 2020-08-08 VITALS — BP 115/60

## 2020-08-08 DIAGNOSIS — I10 Essential (primary) hypertension: Secondary | ICD-10-CM | POA: Diagnosis not present

## 2020-08-08 DIAGNOSIS — K219 Gastro-esophageal reflux disease without esophagitis: Secondary | ICD-10-CM

## 2020-08-08 DIAGNOSIS — E1129 Type 2 diabetes mellitus with other diabetic kidney complication: Secondary | ICD-10-CM | POA: Diagnosis not present

## 2020-08-08 DIAGNOSIS — G959 Disease of spinal cord, unspecified: Secondary | ICD-10-CM | POA: Diagnosis not present

## 2020-08-08 MED ORDER — HYDROCHLOROTHIAZIDE 12.5 MG PO TABS: 12.5000 mg | ORAL_TABLET | Freq: Every day | ORAL | 1 refills | Status: DC

## 2020-08-08 MED ORDER — BLOOD GLUCOSE MONITOR KIT: PACK | 0 refills | Status: DC

## 2020-08-08 MED ORDER — DULOXETINE HCL 20 MG PO CPEP: 20.0000 mg | ORAL_CAPSULE | ORAL | 1 refills | Status: DC

## 2020-08-08 MED ORDER — FAMOTIDINE 20 MG PO TABS: 20.0000 mg | ORAL_TABLET | Freq: Every day | ORAL | 1 refills | Status: DC

## 2020-08-08 NOTE — Progress Notes (Signed)
Virtual Visit via Video Note; patient requested telephone call which was done instead of video.  I connected with Doris Jackson  on 08/08/20 at  1:30 PM EST by telephone and verified that I am speaking with the correct person using two identifiers.  Location patient: home Location provider: [place], [place] [address] Persons participating in the virtual visit: patient, provider  I discussed the limitations of evaluation and management by telemedicine and the availability of in person appointments. The patient expressed understanding and agreed to proceed.   Doris Jackson DOB: 09-Aug-1940 Encounter date: 08/08/2020  This is a 80 y.o. female who presents with Chief Complaint  Patient presents with   Follow-up    Patient states her blood pressure has not been elevated, concerned with HCTZ not being on her medication list as she is taking this, states she discontinued Amlodipine 10mg  2 months ago   requests Rx for BP cuff and diabetic shoes    History of present illness: Saw Dr. Elease Hashimoto back in January with hypotension. She had stopped amlodipine 10mg  daily and felt better.    HTN: spironolactone she is taking every other day; benazepril 40mg  daily, toprol 50mg  daily, hctz 12.5mg  daily. States that her numbers this morning 109/62, recheck 111/57. Has been about 115. Usually diastolic in the low 38'L.   *diabetic shoes, sugar meter requested.  Sugar has been staying low. She states that she hasn't been taking jardiance   Allergies  Allergen Reactions   Dust Mite Extract Itching and Cough   Current Meds  Medication Sig   acetaminophen (TYLENOL) 500 MG tablet Take 500 mg by mouth every 6 (six) hours as needed for mild pain.   ADVAIR DISKUS 250-50 MCG/DOSE AEPB Inhale 1 puff into the lungs 2 (two) times daily.   Alum Hydroxide-Mag Carbonate (GAVISCON EXTRA RELIEF FORMULA) 508-475 MG/10ML SUSP Take by mouth.   Ascorbic Acid (VITAMIN C) 1000 MG  tablet Take 1,000 mg by mouth daily.   benazepril (LOTENSIN) 40 MG tablet TAKE 1 TABLET BY MOUTH EVERY DAY   Cholecalciferol (VITAMIN D) 2000 UNITS CAPS Take 4,000 Units by mouth daily.    cyanocobalamin 2000 MCG tablet Take 2,000 mcg by mouth daily.   cyclopentolate (CYCLODRYL,CYCLOGYL) 1 % ophthalmic solution Apply to eye.   DULoxetine (CYMBALTA) 20 MG capsule TAKE 1 CAPSULE (20 MG TOTAL) BY MOUTH AT BEDTIME.   EPINEPHrine 0.3 mg/0.3 mL IJ SOAJ injection SMARTSIG:0.3 Milliliter(s) IM Once PRN   famotidine (PEPCID) 40 MG tablet Take 40 mg by mouth at bedtime.    FERREX 150 150 MG capsule TAKE 1 CAPSULE BY MOUTH EVERY DAY   fexofenadine (ALLEGRA) 180 MG tablet Take 180 mg by mouth daily as needed for allergies.   fluticasone (FLONASE) 50 MCG/ACT nasal spray Place 1 spray into both nostrils daily.   glipiZIDE (GLUCOTROL XL) 5 MG 24 hr tablet TAKE 1 TABLET BY MOUTH EVERY DAY   hydrochlorothiazide (HYDRODIURIL) 12.5 MG tablet Take 12.5 mg by mouth daily.   Lancets 30G MISC USE TWICE DAILY AS DIRECTED FOR GLUCOSE TESTING AND MONITORING   metFORMIN (GLUCOPHAGE-XR) 500 MG 24 hr tablet Take 2 tablets (1,000 mg total) by mouth daily with breakfast. (Patient taking differently: Take 500 mg by mouth 2 (two) times daily.)   metoprolol succinate (TOPROL-XL) 50 MG 24 hr tablet TAKE 1 TABLET BY MOUTH EVERY DAY   Multiple Vitamin (MULTIVITAMIN WITH MINERALS) TABS tablet Take 1 tablet by mouth daily.   omeprazole (  PRILOSEC) 40 MG capsule TAKE 1 CAPSULE (40 MG TOTAL) BY MOUTH AT BEDTIME.   ONE TOUCH LANCETS MISC Test twice daily.   ONETOUCH VERIO test strip TEST TWICE A DAY. DX E11.9   pioglitazone (ACTOS) 45 MG tablet TAKE 1 TABLET BY MOUTH EVERY DAY   potassium chloride (MICRO-K) 10 MEQ CR capsule TAKE 1 CAPSULE TWICE A DAY X 3 DAYS, THEN TAKE WITH USE OF LASIX   pravastatin (PRAVACHOL) 40 MG tablet Take 1 tablet (40 mg total) by mouth daily.   prednisoLONE acetate (PRED FORTE) 1 % ophthalmic suspension  Place 1 drop into both eyes See admin instructions. Place 1 drop into left eye three times daily Place 1 drop into right eye once daily   Probiotic Product (PROBIOTIC-10 PO) Take 1 capsule by mouth daily.   senna-docusate (SENOKOT-S) 8.6-50 MG tablet Take 1 tablet by mouth at bedtime.   sitaGLIPtin (JANUVIA) 100 MG tablet Take 1 tablet (100 mg total) by mouth daily.   spironolactone (ALDACTONE) 25 MG tablet TAKE 1/2 TABLET BY MOUTH EVERY DAY    Review of Systems  Constitutional: Negative for chills, fatigue and fever.  Respiratory: Negative for cough, chest tightness, shortness of breath and wheezing.   Cardiovascular: Negative for chest pain, palpitations and leg swelling.  Neurological: Negative for headaches.    Objective:  BP 115/60       BP Readings from Last 3 Encounters:  08/08/20 115/60  07/18/20 128/72  06/14/20 120/62   Wt Readings from Last 3 Encounters:  07/18/20 221 lb (100.2 kg)  06/14/20 217 lb 6.4 oz (98.6 kg)  03/21/20 206 lb 8 oz (93.7 kg)    EXAM:  GENERAL: sounds alert, oriented, appears well and in no acute distress LUNGS: no cough or noted sob during visit PSYCH/NEURO: pleasant and cooperative, no obvious depression or anxiety, speech and thought processing grossly intact   Assessment/Plan  1. Essential hypertension Bp well controlled; continue to monitor at home for low pressures; we may need to continue to adjust medications.   2. 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 Continue to monitor sugars. Cost has been issue with meds in past. Continue with glipizide 5mg  daily and metformin 500mg  daily, actos.  - For Home Use Only DME Diabetic Shoe  3. Gastroesophageal reflux disease, unspecified whether esophagitis present Continue with pepcid daily.   4. Cervical myelopathy ([place]) She has been doing better with neck and  myelopathy since surgery. Continues to note improvement in function.      I discussed the assessment and treatment plan with the patient. The patient was provided an opportunity to ask questions and all were answered. The patient agreed with the plan and demonstrated an understanding of the instructions.   The patient was advised to call back or seek an in-person evaluation if the symptoms worsen or if the condition fails to improve as anticipated.  I provided 20 minutes of non-face-to-face time during this encounter.   [name], MD

## 2020-08-09 ENCOUNTER — Other Ambulatory Visit: Payer: Self-pay | Admitting: Family Medicine

## 2020-08-09 DIAGNOSIS — J3089 Other allergic rhinitis: Secondary | ICD-10-CM | POA: Diagnosis not present

## 2020-08-09 DIAGNOSIS — J301 Allergic rhinitis due to pollen: Secondary | ICD-10-CM | POA: Diagnosis not present

## 2020-08-09 DIAGNOSIS — J3081 Allergic rhinitis due to animal (cat) (dog) hair and dander: Secondary | ICD-10-CM | POA: Diagnosis not present

## 2020-08-29 DIAGNOSIS — J3089 Other allergic rhinitis: Secondary | ICD-10-CM | POA: Diagnosis not present

## 2020-08-29 DIAGNOSIS — J301 Allergic rhinitis due to pollen: Secondary | ICD-10-CM | POA: Diagnosis not present

## 2020-08-30 ENCOUNTER — Telehealth: Payer: Self-pay | Admitting: Family Medicine

## 2020-08-30 NOTE — Telephone Encounter (Signed)
Pt call and stated she need a glucometer sent to  [id number] - [place], [place] Phone:  [phone]  Fax:  [phone]

## 2020-08-31 MED ORDER — BLOOD GLUCOSE METER KIT: PACK | 0 refills | Status: DC

## 2020-08-31 NOTE — Telephone Encounter (Signed)
Rx sent to [facility]

## 2020-09-10 DIAGNOSIS — T84216A Breakdown (mechanical) of internal fixation device of vertebrae, initial encounter: Secondary | ICD-10-CM | POA: Diagnosis not present

## 2020-09-12 ENCOUNTER — Other Ambulatory Visit: Payer: Self-pay

## 2020-09-12 ENCOUNTER — Ambulatory Visit: Payer: Medicare HMO

## 2020-09-12 VITALS — Wt 221.0 lb

## 2020-09-12 DIAGNOSIS — Z0000 Encounter for general adult medical examination without abnormal findings: Secondary | ICD-10-CM

## 2020-09-13 ENCOUNTER — Emergency Department (HOSPITAL_COMMUNITY): Payer: Medicare HMO

## 2020-09-13 ENCOUNTER — Emergency Department (HOSPITAL_COMMUNITY)
Admission: EM | Admit: 2020-09-13 | Discharge: 2020-09-13 | Disposition: A | Discharge status: Home / Self Care | Payer: Medicare HMO | Attending: Emergency Medicine | Admitting: Emergency Medicine

## 2020-09-13 DIAGNOSIS — Y9301 Activity, walking, marching and hiking: Secondary | ICD-10-CM | POA: Insufficient documentation

## 2020-09-13 DIAGNOSIS — J449 Chronic obstructive pulmonary disease, unspecified: Secondary | ICD-10-CM | POA: Insufficient documentation

## 2020-09-13 DIAGNOSIS — S42211A Unspecified displaced fracture of surgical neck of right humerus, initial encounter for closed fracture: Secondary | ICD-10-CM | POA: Diagnosis not present

## 2020-09-13 DIAGNOSIS — M542 Cervicalgia: Secondary | ICD-10-CM | POA: Diagnosis not present

## 2020-09-13 DIAGNOSIS — S42201A Unspecified fracture of upper end of right humerus, initial encounter for closed fracture: Secondary | ICD-10-CM | POA: Insufficient documentation

## 2020-09-13 DIAGNOSIS — E119 Type 2 diabetes mellitus without complications: Secondary | ICD-10-CM | POA: Diagnosis not present

## 2020-09-13 DIAGNOSIS — S01111A Laceration without foreign body of right eyelid and periocular area, initial encounter: Secondary | ICD-10-CM | POA: Insufficient documentation

## 2020-09-13 DIAGNOSIS — M79601 Pain in right arm: Secondary | ICD-10-CM | POA: Diagnosis not present

## 2020-09-13 DIAGNOSIS — Z7984 Long term (current) use of oral hypoglycemic drugs: Secondary | ICD-10-CM | POA: Insufficient documentation

## 2020-09-13 DIAGNOSIS — S42291A Other displaced fracture of upper end of right humerus, initial encounter for closed fracture: Secondary | ICD-10-CM | POA: Diagnosis not present

## 2020-09-13 DIAGNOSIS — I10 Essential (primary) hypertension: Secondary | ICD-10-CM | POA: Diagnosis not present

## 2020-09-13 DIAGNOSIS — Z79899 Other long term (current) drug therapy: Secondary | ICD-10-CM | POA: Diagnosis not present

## 2020-09-13 DIAGNOSIS — M25521 Pain in right elbow: Secondary | ICD-10-CM | POA: Diagnosis not present

## 2020-09-13 DIAGNOSIS — T148XXA Other injury of unspecified body region, initial encounter: Secondary | ICD-10-CM

## 2020-09-13 DIAGNOSIS — S0181XA Laceration without foreign body of other part of head, initial encounter: Secondary | ICD-10-CM | POA: Insufficient documentation

## 2020-09-13 DIAGNOSIS — Z743 Need for continuous supervision: Secondary | ICD-10-CM | POA: Diagnosis not present

## 2020-09-13 DIAGNOSIS — W01198A Fall on same level from slipping, tripping and stumbling with subsequent striking against other object, initial encounter: Secondary | ICD-10-CM | POA: Insufficient documentation

## 2020-09-13 DIAGNOSIS — W19XXXA Unspecified fall, initial encounter: Secondary | ICD-10-CM | POA: Diagnosis not present

## 2020-09-13 DIAGNOSIS — Z853 Personal history of malignant neoplasm of breast: Secondary | ICD-10-CM | POA: Insufficient documentation

## 2020-09-13 DIAGNOSIS — S0990XA Unspecified injury of head, initial encounter: Secondary | ICD-10-CM | POA: Diagnosis not present

## 2020-09-13 DIAGNOSIS — S4991XA Unspecified injury of right shoulder and upper arm, initial encounter: Secondary | ICD-10-CM | POA: Diagnosis present

## 2020-09-13 MED ORDER — OXYCODONE-ACETAMINOPHEN 5-325 MG PO TABS
1.0000 | ORAL_TABLET | Freq: Once | ORAL | Status: AC
  Administered 2020-09-13: 1 via ORAL
  Filled 2020-09-13: qty 1

## 2020-09-13 MED ORDER — OXYCODONE-ACETAMINOPHEN 5-325 MG PO TABS
1.0000 | ORAL_TABLET | Freq: Four times a day (QID) | ORAL | 0 refills | Status: AC | PRN
Start: 2020-09-13 — End: 2020-09-18

## 2020-09-13 MED ORDER — OXYCODONE-ACETAMINOPHEN 5-325 MG PO TABS: 1.0000 | ORAL_TABLET | Freq: Four times a day (QID) | ORAL | 0 refills | Status: DC | PRN

## 2020-09-13 NOTE — ED Triage Notes (Signed)
BIB ambulance was reported pt ambulate with a cane, while at [facility] pt lost footing and fell. She hit her head and fell on her right shoulder. Pt. Is unsure if she hit the carpet or bottom shelf. She has a laceration on above right eyebrow. RN made aware pt had a nose bleed but upon EMS arrival no active bleeding present. Pt. Is concerned about right eye redness because it wasn't present before. Pt. Had an eye surgery in July 2021 on the right eye. Pt. Stated pain 10/10 when moving. There were know obvious deformities palpated. VS provided by EMS BP 132/72 HR 90 SpO2 99% room air RR 20 Pt. Is diabetic CBG 204

## 2020-09-13 NOTE — ED Notes (Signed)
Pt requesting pain medication.  MD notified.

## 2020-09-13 NOTE — ED Notes (Signed)
Ortho tech notified of need for shoulder immobilizer.

## 2020-09-13 NOTE — ED Notes (Signed)
Ortho tech at bedside 

## 2020-09-13 NOTE — Discharge Instructions (Signed)
-   Please call Dr. Doreatha Martin office in the morning to schedule an appointment for early next week. - I sent pain medication to your pharmacy. Please take as directed. - Keep your arm in your splint at all times.

## 2020-09-13 NOTE — ED Provider Notes (Signed)
[facility] EMERGENCY DEPARTMENT Provider Note   CSN: [id number] Arrival date & time: 09/13/20  1550     History Chief Complaint  Patient presents with  . Fall    Doris Jackson is a 80 y.o. female.  Patient is a 80 year old female not on any anticoagulation medications who presents after a fall.  Patient reports that she was walking into a drug store when she had a mechanical fall and landed on her head and her right shoulder.  Patient currently reports pain in her head and pain in her right shoulder.  Denies neck pain, back pain, chest pain, abdominal pain.  Denies any symptoms preceding event like chest pain, shortness of breath.  States that her footing got messed up with her cane and she fell.  Reports mild laceration to the forehead.  Denies changes in vision.  Does not have any weakness in any extremity.  Reports pain in her right shoulder which makes it hard to move her right upper extremity at all.  Denies any recent fevers or chills.  Denies recent surgeries, however does report a cervical fusion surgery a few years back.        Past Medical History:  Diagnosis Date  . ALLERGIC RHINITIS 06/17/2010  . ANEMIA-NOS 06/17/2010  . BREAST CANCER, HX OF 06/17/2010  . Cancer [facility]) 2008   right breast  . Cataract   . COPD 06/17/2010  . DIABETES MELLITUS, TYPE II 06/17/2010  . Endometriosis   . Fibroid    FIBROID  . GERD (gastroesophageal reflux disease)   . HYPERLIPIDEMIA 06/17/2010  . HYPERTENSION 06/17/2010  . Leg swelling   . LOW BACK PAIN 06/17/2010  . OSTEOARTHRITIS 06/17/2010  . OSTEOPENIA 06/17/2010  . Weakness     Patient Active Problem List   Diagnosis Date Noted  . Allergic rhinitis due to animal (cat) (dog) hair and dander 07/18/2020  . Leiomyoma 07/18/2020  . 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 Anxiety state   . Acute blood loss anemia   . Postoperative pain   . Cervical myelopathy ([place]) 09/22/2019  . S/P  cervical spinal fusion 09/22/2019  . 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 AKI (acute kidney injury) ([place]) 09/14/2019  . GERD (gastroesophageal reflux disease) 09/14/2019  . Spinal stenosis in cervical region 09/14/2019  . Weakness 09/14/2019  . Numbness and tingling 09/14/2019  . Cervical spinal stenosis 09/14/2019  . Status post cataract extraction and insertion of intraocular lens of left eye 09/04/2019  . Lumbar stenosis 08/24/2019  . Allergic rhinitis due to pollen 06/06/2019  . Persistent cough 06/06/2019  . History of Descemet membrane endothelial keratoplasty (DMEK) 01/04/2019  . Status post cataract extraction and insertion of intraocular lens of right eye 01/04/2019  . Senile nuclear sclerosis 01/02/2019  . 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 Obesity, morbid, BMI 40.0-49.9 ([place]) 04/19/2018  . Morbid obesity ([place]) 04/19/2018  . Cataract   . Fibroid   . Endometriosis   . DM (diabetes mellitus) type II controlled with renal manifestation ([place]) 06/17/2010  . Dyslipidemia 06/17/2010  . Microcytic anemia 06/17/2010  . Essential hypertension 06/17/2010  . Allergic rhinitis 06/17/2010  . Osteoarthritis 06/17/2010  . LOW BACK PAIN 06/17/2010  . OSTEOPENIA 06/17/2010  . BREAST CANCER, HX OF 06/17/2010  . Disorder of skeletal system 06/17/2010    Past Surgical History:  Procedure Laterality Date  . ABDOMINAL HYSTERECTOMY  1988  TAH,LSO  . ANTERIOR CERVICAL DECOMP/DISCECTOMY FUSION N/A 09/15/2019   Procedure: Cervical five CorpectomyANTERIOR CERVICAL DECOMPRESSION/DISCECTOMY FUSION CERVICAL THREE-CERVICAL Four;  Surgeon: Ashok Pall, MD;  Location: [facility];  Service: Neurosurgery;  Laterality: N/A;  . BREAST LUMPECTOMY Right 2007   LUMPECTOMY FOLLOWED BY RADIATION  . COLONOSCOPY    . CORNEAL TRANSPLANT Right 2020  . 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 EYE SURGERY Bilateral    Cataract  .  HARDWARE REMOVAL N/A 10/21/2019   Procedure: Anterior cervical plate removal with revision;  Surgeon: Ashok Pall, MD;  Location: [facility];  Service: Neurosurgery;  Laterality: N/A;  3C  . [place] Bilateral   . OOPHORECTOMY  1988   TAH,LSO  . TUBAL LIGATION       OB History    Gravida  3   Para  3   Term  3   Preterm      AB  0   Living  2     SAB      IAB      Ectopic      Multiple      Live Births              Family History  Problem Relation Age of Onset  . Diabetes Mother   . Hypertension Sister   . Asthma Father   . Diabetes Brother     Social History   Tobacco Use  . Smoking status: Never Smoker  . Smokeless tobacco: Never Used  Substance Use Topics  . Alcohol use: No  . Drug use: No    Home Medications Prior to Admission medications   Medication Sig Start Date End Date Taking? Authorizing Provider  oxyCODONE-acetaminophen (PERCOCET) 5-325 MG tablet Take 1 tablet by mouth every 6 (six) hours as needed for up to 5 days for severe pain. 09/13/20 09/18/20 Yes Kugler, [place], MD  acetaminophen (TYLENOL) 500 MG tablet Take 500 mg by mouth every 6 (six) hours as needed for mild pain.    [provider]  ADVAIR DISKUS 250-50 MCG/DOSE AEPB Inhale 1 puff into the lungs 2 (two) times daily. 05/23/20   Caren Macadam, MD  Alum Hydroxide-Mag Carbonate (GAVISCON EXTRA RELIEF FORMULA) [phone] MG/10ML SUSP Take by mouth. 02/02/20   [provider]  Ascorbic Acid (VITAMIN C) 1000 MG tablet Take 1,000 mg by mouth daily.    [provider]  benazepril (LOTENSIN) 40 MG tablet TAKE 1 TABLET BY MOUTH EVERY DAY 06/28/20   Koberlein, Steele Berg, MD  blood glucose meter kit and supplies KIT Dispense based on patient and insurance preference. Use up to four times daily as directed. (FOR ICD-9 250.00, 250.01). 08/08/20   Caren Macadam, MD  blood glucose meter kit and supplies Dispense based on patient and insurance preference. Use up to four  times daily as directed. (FOR ICD-10 E10.9, E11.9). 08/31/20   Caren Macadam, MD  Cholecalciferol (VITAMIN D) 2000 UNITS CAPS Take 4,000 Units by mouth daily.     [provider]  cyanocobalamin 2000 MCG tablet Take 2,000 mcg by mouth daily.    [provider]  cyclopentolate (CYCLODRYL,CYCLOGYL) 1 % ophthalmic solution Apply to eye.    [provider]  DULoxetine (CYMBALTA) 20 MG capsule Take 1 capsule (20 mg total) by mouth every other day. 08/08/20   Caren Macadam, MD  EPINEPHrine 0.3 mg/0.3 mL IJ SOAJ injection SMARTSIG:0.3 Milliliter(s) IM Once PRN 12/09/19   [provider]  famotidine (PEPCID) 20 MG tablet  Take 1 tablet (20 mg total) by mouth at bedtime. 08/08/20   Koberlein, Steele Berg, MD  FERREX 150 150 MG capsule TAKE 1 CAPSULE BY MOUTH EVERY DAY 11/01/19   Koberlein, Andris Flurry C, MD  fexofenadine (ALLEGRA) 180 MG tablet Take 180 mg by mouth daily as needed for allergies.    [provider]  fluticasone (FLONASE) 50 MCG/ACT nasal spray Place 1 spray into both nostrils daily.    [provider]  glipiZIDE (GLUCOTROL XL) 5 MG 24 hr tablet TAKE 1 TABLET BY MOUTH EVERY DAY 04/09/20   Koberlein, Andris Flurry C, MD  hydrochlorothiazide (HYDRODIURIL) 12.5 MG tablet Take 1 tablet (12.5 mg total) by mouth daily. 08/08/20   Caren Macadam, MD  Lancets 30G MISC USE TWICE DAILY AS DIRECTED FOR GLUCOSE TESTING AND MONITORING 01/19/14   Marletta Lor, MD  metFORMIN (GLUCOPHAGE-XR) 500 MG 24 hr tablet Take 2 tablets (1,000 mg total) by mouth daily with breakfast. Patient taking differently: Take 500 mg by mouth 2 (two) times daily. 02/06/20   Caren Macadam, MD  metoprolol succinate (TOPROL-XL) 50 MG 24 hr tablet TAKE 1 TABLET BY MOUTH EVERY DAY 05/10/20   Caren Macadam, MD  Multiple Vitamin (MULTIVITAMIN WITH MINERALS) TABS tablet Take 1 tablet by mouth daily. 10/07/19   Love, Ivan Anchors, PA-C  ONE TOUCH LANCETS MISC Test twice daily. 08/01/14    Marletta Lor, MD  [facility] VERIO test strip TEST TWICE A DAY. DX E11.9 11/23/19   Koberlein, Andris Flurry C, MD  pioglitazone (ACTOS) 45 MG tablet TAKE 1 TABLET BY MOUTH EVERY DAY 03/27/20   Koberlein, Andris Flurry C, MD  potassium chloride (MICRO-K) 10 MEQ CR capsule TAKE 1 CAPSULE TWICE A DAY X 3 DAYS, THEN TAKE WITH USE OF LASIX 08/09/20   Koberlein, Junell C, MD  pravastatin (PRAVACHOL) 40 MG tablet Take 1 tablet (40 mg total) by mouth daily. 05/15/20   Caren Macadam, MD  prednisoLONE acetate (PRED FORTE) 1 % ophthalmic suspension Place 1 drop into both eyes See admin instructions. Place 1 drop into left eye three times daily Place 1 drop into right eye once daily 09/14/19   [provider]  Probiotic Product (PROBIOTIC-10 PO) Take 1 capsule by mouth daily.    [provider]  senna-docusate (SENOKOT-S) 8.6-50 MG tablet Take 1 tablet by mouth at bedtime. 10/06/19   Love, Ivan Anchors, PA-C  sitaGLIPtin (JANUVIA) 100 MG tablet Take 1 tablet (100 mg total) by mouth daily. 04/06/20   Caren Macadam, MD  spironolactone (ALDACTONE) 25 MG tablet TAKE 1/2 TABLET BY MOUTH EVERY DAY 07/18/20   Caren Macadam, MD    Allergies    Dust mite extract  Review of Systems   Review of Systems  Constitutional: Negative for chills and fever.  HENT: Negative for ear pain and sore throat.   Eyes: Negative for pain and visual disturbance.  Respiratory: Negative for cough and shortness of breath.   Cardiovascular: Negative for chest pain and palpitations.  Gastrointestinal: Negative for abdominal pain and vomiting.  Genitourinary: Negative for dysuria and hematuria.  Musculoskeletal: Negative for arthralgias and back pain.  Skin: Negative for color change and rash.  Neurological: Positive for headaches. Negative for seizures.  All other systems reviewed and are negative.   Physical Exam Updated Vital Signs BP (!) 144/81 (BP Location: Left Arm)   Pulse 92   Temp (!) 97.4 F (36.3 C)  (Oral)   Resp 18   Ht '5\' 2"'  (1.575  m)   Wt 102.1 kg   SpO2 100%   BMI 41.15 kg/m   Physical Exam Vitals and nursing note reviewed.  Constitutional:      General: She is not in acute distress.    Appearance: She is well-developed.  HENT:     Head: Normocephalic.     Comments: Small 0.5 cm laceration over right eyebrow Tenderness present over superior right orbital     Right Ear: External ear normal.     Left Ear: External ear normal.     Nose: Nose normal.     Mouth/Throat:     Mouth: Mucous membranes are moist.     Pharynx: Oropharynx is clear.  Eyes:     Extraocular Movements: Extraocular movements intact.     Comments: Evidence of prior cataract surgeries  Cardiovascular:     Rate and Rhythm: Normal rate and regular rhythm.  Pulmonary:     Effort: Pulmonary effort is normal.     Breath sounds: Normal breath sounds.  Abdominal:     Palpations: Abdomen is soft.     Tenderness: There is no abdominal tenderness.  Musculoskeletal:        General: Tenderness and signs of injury present.     Cervical back: Normal range of motion. No tenderness.     Comments: Tenderness over the right shoulder Patient refusing range of motion of the right shoulder due to pain Right arm sensation intact Strong right grip strength Radial pulse +2  Skin:    General: Skin is warm and dry.  Neurological:     Mental Status: She is alert and oriented to person, place, and time.     ED Results / Procedures / Treatments   Labs (all labs ordered are listed, but only abnormal results are displayed) Labs Reviewed - No data to display  EKG None  Radiology DG Shoulder Right  Result Date: 09/13/2020 CLINICAL DATA:  Fall landing on right arm. EXAM: RIGHT SHOULDER - 2+ VIEW COMPARISON:  None. FINDINGS: Significantly displaced right proximal humerus fracture with dominant fracture plane through the surgical neck. There is 2.4 cm medial displacement of the humeral shaft with respect to the humeral  head. Mild apex anterior angulation. Humeral head remains aligned with the glenoid with glenohumeral joint space narrowing. Incidental os acromial. Macrolobulated densities projecting over the right chest wall/breast. IMPRESSION: Significantly displaced and angulated right proximal humerus fracture with dominant fracture plane through the surgical neck. Electronically Signed   By: Keith Rake M.D.   On: 09/13/2020 18:07   DG Elbow 2 Views Right  Result Date: 09/13/2020 CLINICAL DATA:  Fall onto right arm.  Right arm pain. EXAM: RIGHT ELBOW - 2 VIEW COMPARISON:  None. FINDINGS: There is no evidence of fracture, dislocation, or joint effusion. Mild osteoarthritis with spurring. Soft tissues are unremarkable. IMPRESSION: No fracture or subluxation of the right elbow. Electronically Signed   By: Keith Rake M.D.   On: 09/13/2020 18:08   CT Head Wo Contrast  Result Date: 09/13/2020 CLINICAL DATA:  Fall striking head. No loss of consciousness. Cerebral hemorrhage suspected. EXAM: CT HEAD WITHOUT CONTRAST TECHNIQUE: Contiguous axial images were obtained from the base of the skull through the vertex without intravenous contrast. COMPARISON:  Brain MRI 09/10/2019 FINDINGS: Brain: And mild motion artifact. No intracranial hemorrhage, mass effect, or midline shift. Normal brain volume for age. No hydrocephalus. The basilar cisterns are patent. No evidence of territorial infarct or acute ischemia. No extra-axial or intracranial fluid collection. Vascular: No hyperdense  vessel. Skull: No fracture or focal lesion. Sinuses/Orbits: Motion artifact through the orbits and sinuses. The mastoid air cells are clear. Other: None. IMPRESSION: No acute intracranial abnormality. No skull fracture. Electronically Signed   By: Keith Rake M.D.   On: 09/13/2020 17:35   CT Cervical Spine Wo Contrast  Result Date: 09/13/2020 CLINICAL DATA:  Fall striking head. Cervical neck pain. Prior cervical surgery. EXAM: CT  CERVICAL SPINE WITHOUT CONTRAST TECHNIQUE: Multidetector CT imaging of the cervical spine was performed without intravenous contrast. Multiplanar CT image reconstructions were also generated. COMPARISON:  Radiograph 11/07/2019 FINDINGS: Alignment: No traumatic subluxation. Straightening of normal lordosis. Skull base and vertebrae: No acute fracture. The dens and skull base are intact. Anterior fusion C4 through C6. No hardware fracture. The C6 screws approach the C5-C6 disc space. Bone plug at C3-C4. Prior C5 corpectomy. Scalloping posterior margin of C5 is likely related to prior surgery. Soft tissues and spinal canal: No prevertebral fluid or swelling. No visible canal hematoma. Disc levels: Anterior fusion C4 through C6. Bone plug anterior C3 with C3-C4 disc space narrowing, unchanged from prior. Disc space narrowing C6-C7. Upper chest: No acute or unexpected finding. Other: None. IMPRESSION: 1. No acute fracture or subluxation of the cervical spine. 2. Anterior fusion C4 through C6. Electronically Signed   By: Keith Rake M.D.   On: 09/13/2020 17:33   DG Humerus Right  Result Date: 09/13/2020 CLINICAL DATA:  Fall onto right arm.  Right arm pain. EXAM: RIGHT HUMERUS - 2+ VIEW COMPARISON:  None. FINDINGS: Proximal humeral fracture primarily through the surgical neck better assessed on concurrent shoulder exam. The distal humerus is intact. No distal humerus fracture. Elbow alignment assessed on concurrent elbow exam. IMPRESSION: Proximal humeral fracture primarily through the surgical neck, better assessed on concurrent shoulder exam. Electronically Signed   By: Keith Rake M.D.   On: 09/13/2020 18:08    Procedures Procedures   Medications Ordered in ED Medications  oxyCODONE-acetaminophen (PERCOCET/ROXICET) 5-325 MG per tablet 1 tablet (1 tablet Oral Given 09/13/20 1920)    ED Course  I have reviewed the triage vital signs and the nursing notes.  Pertinent labs & imaging results that  were available during my care of the patient were reviewed by me and considered in my medical decision making (see chart for details).    MDM Rules/Calculators/A&P                         80 year old female who presents after a mechanical fall.  She has not on any blood thinners.  CT head and CT neck ordered.  These were unremarkable.  Patient without any significant headache, neurologic deficits or focal findings, no cervical tenderness, and no neck pain with motion.  X-ray of the right shoulder ordered given patient significant amount of pain.  This revealed proximal humerus fracture.  Patient's fracture was significant displacement.  Reviewed these images with orthopedic doctor on-call Dr. Doreatha Martin.  He recommends a simple right arm sling with close follow-up early next week.  Patient's pain treated with Percocet.  Will prescribe pain medication for the next 2 days in outpatient setting.  Patient placed in right arm sling.  Neurovascularly intact with good sensation throughout the right arm, appropriate grip strength, and +2 radial pulse.  No other injuries identifiable on exam.  Stable for discharge home.  Patient will follow up with orthopedics early next week.  Strict return precautions provided.  Final Clinical Impression(s) / ED Diagnoses Final  diagnoses:  Closed fracture of proximal end of right humerus, unspecified fracture morphology, initial encounter    Rx / DC Orders ED Discharge Orders         Ordered    oxyCODONE-acetaminophen (PERCOCET) 5-325 MG tablet  Every 6 hours PRN        09/13/20 [place], [place], MD 09/13/20 [place], [place], DO 09/13/20 2245

## 2020-09-13 NOTE — ED Provider Notes (Signed)
I have personally seen and examined the patient. I have reviewed the documentation on PMH/FH/Soc Hx. I have discussed the plan of care with the resident and patient.  I have reviewed and agree with the resident's documentation. Please see associated encounter note.  Briefly, the patient is a 80 y.o. female here with mechanical fall and right shoulder pain.  Patient fell while using walker at a store.  Landed on her right shoulder.  Also landed on her face.  Has small abrasion to the right eyebrow.  Did not lose consciousness.  Not on blood thinners.  Will get head CT and neck CT.  Will get x-rays of right shoulder to rule out fracture/dislocation.  She is neurovascularly neuromuscularly intact on exam.  Not having any chest pain or shortness of breath.  Patient with right proximal humerus fracture.  Significant displacement and angulation through surgical neck.  No dislocation.  CT of the head and neck is unremarkable.  Will touch base with orthopedics but anticipate sling and outpatient follow-up.  This chart was dictated using voice recognition software.  Despite best efforts to proofread,  errors can occur which can change the documentation meaning.     EKG Interpretation None         Lennice Sites, DO 09/13/20 1851

## 2020-09-14 ENCOUNTER — Other Ambulatory Visit: Payer: Self-pay | Admitting: Internal Medicine

## 2020-09-14 DIAGNOSIS — R197 Diarrhea, unspecified: Secondary | ICD-10-CM

## 2020-09-15 NOTE — Telephone Encounter (Signed)
Please confirm with patient she is still using this. [place] for refill if so. Make sure rx sig matches how she is taking it.

## 2020-09-17 DIAGNOSIS — S42294A Other nondisplaced fracture of upper end of right humerus, initial encounter for closed fracture: Secondary | ICD-10-CM | POA: Diagnosis not present

## 2020-09-17 DIAGNOSIS — J301 Allergic rhinitis due to pollen: Secondary | ICD-10-CM | POA: Diagnosis not present

## 2020-09-17 DIAGNOSIS — J3089 Other allergic rhinitis: Secondary | ICD-10-CM | POA: Diagnosis not present

## 2020-09-17 DIAGNOSIS — J3081 Allergic rhinitis due to animal (cat) (dog) hair and dander: Secondary | ICD-10-CM | POA: Diagnosis not present

## 2020-09-17 NOTE — Telephone Encounter (Signed)
Left a detailed message at the pts cell number asking if she is still taking the probiotic as below and if so, please let us know how often she is taking this and a refill can be sent in.

## 2020-09-18 ENCOUNTER — Other Ambulatory Visit: Payer: Self-pay

## 2020-09-18 ENCOUNTER — Encounter (HOSPITAL_COMMUNITY): Payer: Self-pay | Admitting: Orthopaedic Surgery

## 2020-09-18 ENCOUNTER — Other Ambulatory Visit (HOSPITAL_COMMUNITY)
Admission: RE | Admit: 2020-09-18 | Discharge: 2020-09-18 | Disposition: A | Discharge status: Home / Self Care | Payer: Medicare HMO | Source: Ambulatory Visit | Attending: Orthopaedic Surgery | Admitting: Orthopaedic Surgery

## 2020-09-18 DIAGNOSIS — S42294D Other nondisplaced fracture of upper end of right humerus, subsequent encounter for fracture with routine healing: Secondary | ICD-10-CM | POA: Diagnosis not present

## 2020-09-18 DIAGNOSIS — Z20822 Contact with and (suspected) exposure to covid-19: Secondary | ICD-10-CM | POA: Diagnosis not present

## 2020-09-18 DIAGNOSIS — Z01812 Encounter for preprocedural laboratory examination: Secondary | ICD-10-CM | POA: Insufficient documentation

## 2020-09-18 LAB — SARS CORONAVIRUS 2 (TAT 6-24 HRS): SARS Coronavirus 2: NEGATIVE

## 2020-09-18 NOTE — Progress Notes (Addendum)
COVID Vaccine Completed:Yes Date COVID Vaccine completed: 08/14/19 Has received booster: 04/18/20 COVID vaccine manufacturer: Pfizer     Date of COVID positive in last 90 days: No  PCP - Caren Macadam, MD Cardiologist - N/A  Chest x-ray - greater than 1 year in epic  EKG - greater than 1 year in epic Stress Test - N/A ECHO - N/A Cardiac Cath - N/A Pacemaker/ICD device last checked:N/A  Sleep Study - N/A CPAP - N/A  Fasting Blood Sugar - 120's Checks Blood Sugar _2____ times a day  Blood Thinner Instructions: N/A Aspirin Instructions: N/A Last Dose: N/A  Activity level:  Can go up a flight of stairs and activities of daily living without stopping and without symptoms       Anesthesia review: N/A  Patient denies shortness of breath, fever, cough and chest pain at PAT appointment   Patient verbalized understanding of instructions that were given to them at the PAT appointment. Patient was also instructed that they will need to review over the PAT instructions again at home before surgery.

## 2020-09-18 NOTE — H&P (Signed)
PREOPERATIVE H&P  Chief Complaint: right proximal humerus fracture  HPI: Doris Jackson is a 80 y.o. female who is scheduled for Procedure(s): REVERSE SHOULDER ARTHROPLASTY.   Patient has a past medical history significant for HTN, HLD, GERD, DM2, COPD .   Patient had a fall on 09/13/2020 when she was walking into a drug store. She fell landing on her right shoulder. She was taken to [facility] Emergency Department for evaluation. Xrays of the right shoulder demonstrated a displaced proximal humerus fracture. She was told to follow-up with orthopedics. She has been in a sling since she was seen in the ED.  Her symptoms are rated as moderate to severe, and have been worsening.  This is significantly impairing activities of daily living.    Please see clinic note for further details on this patient's care.    She has elected for surgical management.   Past Medical History:  Diagnosis Date  . ALLERGIC RHINITIS 06/17/2010  . ANEMIA-NOS 06/17/2010  . BREAST CANCER, HX OF 06/17/2010  . Breast cancer, right ([place]) 2008  . Cataract   . COPD 06/17/2010   pt denies  . DIABETES MELLITUS, TYPE II 06/17/2010  . Endometriosis   . Fibroid    FIBROID  . GERD (gastroesophageal reflux disease)   . HYPERLIPIDEMIA 06/17/2010  . HYPERTENSION 06/17/2010  . Leg swelling   . LOW BACK PAIN 06/17/2010  . OSTEOARTHRITIS 06/17/2010  . OSTEOPENIA 06/17/2010  . Weakness    Past Surgical History:  Procedure Laterality Date  . ABDOMINAL HYSTERECTOMY  1988   TAH,LSO  . ANTERIOR CERVICAL DECOMP/DISCECTOMY FUSION N/A 09/15/2019   Procedure: Cervical five CorpectomyANTERIOR CERVICAL DECOMPRESSION/DISCECTOMY FUSION CERVICAL THREE-CERVICAL Four;  Surgeon: Ashok Pall, MD;  Location: [place];  Service: Neurosurgery;  Laterality: N/A;  . BREAST LUMPECTOMY Right 2007   LUMPECTOMY FOLLOWED BY RADIATION  . COLONOSCOPY    . CORNEAL TRANSPLANT Right 2020  . 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 EYE SURGERY  Bilateral    Cataract  . HARDWARE REMOVAL N/A 10/21/2019   Procedure: Anterior cervical plate removal with revision;  Surgeon: Ashok Pall, MD;  Location: [place];  Service: Neurosurgery;  Laterality: N/A;  3C  . [place] Bilateral   . OOPHORECTOMY  1988   TAH,LSO  . TUBAL LIGATION     Social History   Socioeconomic History  . Marital status: Married    Spouse name: Not on file  . Number of children: Not on file  . Years of education: Not on file  . Highest education level: Not on file  Occupational History  . Not on file  Tobacco Use  . Smoking status: Never Smoker  . Smokeless tobacco: Never Used  Vaping Use  . Vaping Use: Never used  Substance and Sexual Activity  . Alcohol use: No  . Drug use: No  . Sexual activity: Never    Birth control/protection: Post-menopausal, Surgical    Comment: Tubal Ligation  Other Topics Concern  . Not on file  Social History Narrative  . Not on file   Social Determinants of Health   Financial Resource Strain: High Risk  . Difficulty of Paying Living Expenses: Hard  Food Insecurity: Not on file  Transportation Needs: No Transportation Needs  . Lack of Transportation (Medical): No  . Lack of Transportation (Non-Medical): No  Physical Activity: Not on file  Stress: Not on file  Social Connections: Not on file   Family History  Problem Relation Age of Onset  .  Diabetes Mother   . Hypertension Sister   . Asthma Father   . Diabetes Brother    Allergies  Allergen Reactions  . Dust Mite Extract Itching and Cough  . Latex Itching    Powder gloves   Prior to Admission medications   Medication Sig Start Date End Date Taking? Authorizing Provider  acetaminophen (TYLENOL) 500 MG tablet Take 500 mg by mouth every 6 (six) hours as needed for mild pain.    [provider]  ADVAIR DISKUS 250-50 MCG/DOSE AEPB Inhale 1 puff into the lungs 2 (two) times daily. 05/23/20   Caren Macadam, MD  Alum Hydroxide-Mag Carbonate (GAVISCON  EXTRA RELIEF FORMULA) [phone] MG/10ML SUSP Take by mouth. 02/02/20   [provider]  Ascorbic Acid (VITAMIN C) 1000 MG tablet Take 1,000 mg by mouth daily.    [provider]  benazepril (LOTENSIN) 40 MG tablet TAKE 1 TABLET BY MOUTH EVERY DAY 06/28/20   Koberlein, Steele Berg, MD  blood glucose meter kit and supplies KIT Dispense based on patient and insurance preference. Use up to four times daily as directed. (FOR ICD-9 250.00, 250.01). 08/08/20   Caren Macadam, MD  blood glucose meter kit and supplies Dispense based on patient and insurance preference. Use up to four times daily as directed. (FOR ICD-10 E10.9, E11.9). 08/31/20   Caren Macadam, MD  Cholecalciferol (VITAMIN D) 2000 UNITS CAPS Take 4,000 Units by mouth daily.     [provider]  cyanocobalamin 2000 MCG tablet Take 2,000 mcg by mouth daily.    [provider]  cyclopentolate (CYCLODRYL,CYCLOGYL) 1 % ophthalmic solution Apply to eye.    [provider]  DULoxetine (CYMBALTA) 20 MG capsule Take 1 capsule (20 mg total) by mouth every other day. 08/08/20   Caren Macadam, MD  EPINEPHrine 0.3 mg/0.3 mL IJ SOAJ injection SMARTSIG:0.3 Milliliter(s) IM Once PRN 12/09/19   [provider]  famotidine (PEPCID) 20 MG tablet Take 1 tablet (20 mg total) by mouth at bedtime. 08/08/20   Koberlein, Steele Berg, MD  FERREX 150 150 MG capsule TAKE 1 CAPSULE BY MOUTH EVERY DAY 11/01/19   Koberlein, Andris Flurry C, MD  fexofenadine (ALLEGRA) 180 MG tablet Take 180 mg by mouth daily as needed for allergies.    [provider]  fluticasone (FLONASE) 50 MCG/ACT nasal spray Place 1 spray into both nostrils daily.    [provider]  glipiZIDE (GLUCOTROL XL) 5 MG 24 hr tablet TAKE 1 TABLET BY MOUTH EVERY DAY 04/09/20   Koberlein, Andris Flurry C, MD  hydrochlorothiazide (HYDRODIURIL) 12.5 MG tablet Take 1 tablet (12.5 mg total) by mouth daily. 08/08/20   Caren Macadam, MD  Lancets 30G MISC USE  TWICE DAILY AS DIRECTED FOR GLUCOSE TESTING AND MONITORING 01/19/14   Marletta Lor, MD  metFORMIN (GLUCOPHAGE-XR) 500 MG 24 hr tablet Take 2 tablets (1,000 mg total) by mouth daily with breakfast. Patient taking differently: Take 500 mg by mouth 2 (two) times daily. 02/06/20   Caren Macadam, MD  metoprolol succinate (TOPROL-XL) 50 MG 24 hr tablet TAKE 1 TABLET BY MOUTH EVERY DAY 05/10/20   Caren Macadam, MD  Multiple Vitamin (MULTIVITAMIN WITH MINERALS) TABS tablet Take 1 tablet by mouth daily. 10/07/19   Love, Ivan Anchors, PA-C  ONE TOUCH LANCETS MISC Test twice daily. 08/01/14   Marletta Lor, MD  [facility] VERIO test strip TEST TWICE A DAY. DX E11.9 11/23/19   Caren Macadam, MD  oxyCODONE-acetaminophen (  PERCOCET) 5-325 MG tablet Take 1 tablet by mouth every 6 (six) hours as needed for up to 5 days for severe pain. 09/13/20 09/18/20  Curatolo, Adam, DO  pioglitazone (ACTOS) 45 MG tablet TAKE 1 TABLET BY MOUTH EVERY DAY 03/27/20   Koberlein, Andris Flurry C, MD  potassium chloride (MICRO-K) 10 MEQ CR capsule TAKE 1 CAPSULE TWICE A DAY X 3 DAYS, THEN TAKE WITH USE OF LASIX 08/09/20   Koberlein, Junell C, MD  pravastatin (PRAVACHOL) 40 MG tablet Take 1 tablet (40 mg total) by mouth daily. 05/15/20   Caren Macadam, MD  prednisoLONE acetate (PRED FORTE) 1 % ophthalmic suspension Place 1 drop into both eyes See admin instructions. Place 1 drop into left eye three times daily Place 1 drop into right eye once daily 09/14/19   [provider]  Probiotic Product (PROBIOTIC-10 PO) Take 1 capsule by mouth daily.    [provider]  senna-docusate (SENOKOT-S) 8.6-50 MG tablet Take 1 tablet by mouth at bedtime. 10/06/19   Love, Ivan Anchors, PA-C  sitaGLIPtin (JANUVIA) 100 MG tablet Take 1 tablet (100 mg total) by mouth daily. 04/06/20   Koberlein, Steele Berg, MD  spironolactone (ALDACTONE) 25 MG tablet TAKE 1/2 TABLET BY MOUTH EVERY DAY 07/18/20   Caren Macadam, MD    ROS: All  other systems have been reviewed and were otherwise negative with the exception of those mentioned in the HPI and as above.  Physical Exam: General: Alert, no acute distress Cardiovascular: No pedal edema Respiratory: No cyanosis, no use of accessory musculature GI: No organomegaly, abdomen is soft and non-tender Skin: No lesions in the area of chief complaint Neurologic: Sensation intact distally Psychiatric: Patient is competent for consent with normal mood and affect Lymphatic: No axillary or cervical lymphadenopathy  MUSCULOSKELETAL:  Right upper extremity: tender to palpation RUE. ROM not tested in setting of known fracture. Axillary nerve sensation intact. Distal motor and sensory function intact. Warm well perfused hand.   Imaging: Xrays of right shoulder demonstrate displaced proximal humerus fracture  Assessment: right proximal humerus fracture  Plan: Plan for Procedure(s): REVERSE SHOULDER ARTHROPLASTY  The risks benefits and alternatives were discussed with the patient including but not limited to the risks of nonoperative treatment, versus surgical intervention including infection, bleeding, nerve injury,  blood clots, cardiopulmonary complications, morbidity, mortality, among others, and they were willing to proceed.   We additionally specifically discussed risks of axillary nerve injury, infection, periprosthetic fracture, continued pain and longevity of implants prior to beginning procedure.    Patient will be closely monitored in PACU for medical stabilization and pain control. If found stable in PACU, patient may be discharged home with outpatient follow-up. If any concerns regarding patient's stabilization patient will be admitted for observation after surgery. The patient is planning to be discharged home with outpatient PT.   The patient acknowledged the explanation, agreed to proceed with the plan and consent was signed.   Operative Plan: Right reverse total  shoulder arthroplasty for fracture Discharge Medications: Tylenol, oxycodone, zofran DVT Prophylaxis: Aspirin vs short term DOAC Physical Therapy: Outpatient PT Special Discharge needs: Sling. IceMan.    Ethelda Chick, PA-C  09/18/2020 4:17 PM

## 2020-09-19 ENCOUNTER — Encounter (HOSPITAL_COMMUNITY)
Admission: RE | Disposition: A | Discharge status: Home / Self Care | Payer: Self-pay | Source: Home / Self Care | Attending: Orthopaedic Surgery

## 2020-09-19 ENCOUNTER — Observation Stay (HOSPITAL_COMMUNITY): Payer: Medicare HMO

## 2020-09-19 ENCOUNTER — Observation Stay (HOSPITAL_COMMUNITY)
Admission: RE | Admit: 2020-09-19 | Discharge: 2020-09-27 | Disposition: A | Discharge status: Home / Self Care | Payer: Medicare HMO | Attending: Orthopaedic Surgery | Admitting: Orthopaedic Surgery

## 2020-09-19 ENCOUNTER — Ambulatory Visit (HOSPITAL_COMMUNITY): Payer: Medicare HMO | Admitting: Anesthesiology

## 2020-09-19 ENCOUNTER — Encounter (HOSPITAL_COMMUNITY): Payer: Self-pay | Admitting: Orthopaedic Surgery

## 2020-09-19 ENCOUNTER — Ambulatory Visit (HOSPITAL_COMMUNITY): Payer: Medicare HMO

## 2020-09-19 ENCOUNTER — Telehealth: Payer: Self-pay | Admitting: Family Medicine

## 2020-09-19 DIAGNOSIS — I10 Essential (primary) hypertension: Secondary | ICD-10-CM | POA: Insufficient documentation

## 2020-09-19 DIAGNOSIS — S42251A Displaced fracture of greater tuberosity of right humerus, initial encounter for closed fracture: Secondary | ICD-10-CM | POA: Diagnosis not present

## 2020-09-19 DIAGNOSIS — Z419 Encounter for procedure for purposes other than remedying health state, unspecified: Secondary | ICD-10-CM

## 2020-09-19 DIAGNOSIS — W1830XA Fall on same level, unspecified, initial encounter: Secondary | ICD-10-CM | POA: Diagnosis not present

## 2020-09-19 DIAGNOSIS — Z79899 Other long term (current) drug therapy: Secondary | ICD-10-CM | POA: Diagnosis not present

## 2020-09-19 DIAGNOSIS — S42231A 3-part fracture of surgical neck of right humerus, initial encounter for closed fracture: Principal | ICD-10-CM | POA: Insufficient documentation

## 2020-09-19 DIAGNOSIS — Z853 Personal history of malignant neoplasm of breast: Secondary | ICD-10-CM | POA: Insufficient documentation

## 2020-09-19 DIAGNOSIS — E119 Type 2 diabetes mellitus without complications: Secondary | ICD-10-CM | POA: Diagnosis not present

## 2020-09-19 DIAGNOSIS — Z09 Encounter for follow-up examination after completed treatment for conditions other than malignant neoplasm: Secondary | ICD-10-CM

## 2020-09-19 DIAGNOSIS — Z96611 Presence of right artificial shoulder joint: Secondary | ICD-10-CM

## 2020-09-19 DIAGNOSIS — J449 Chronic obstructive pulmonary disease, unspecified: Secondary | ICD-10-CM | POA: Diagnosis not present

## 2020-09-19 DIAGNOSIS — Z20822 Contact with and (suspected) exposure to covid-19: Secondary | ICD-10-CM | POA: Diagnosis not present

## 2020-09-19 DIAGNOSIS — S42391A Other fracture of shaft of right humerus, initial encounter for closed fracture: Secondary | ICD-10-CM | POA: Diagnosis not present

## 2020-09-19 DIAGNOSIS — Z7984 Long term (current) use of oral hypoglycemic drugs: Secondary | ICD-10-CM | POA: Insufficient documentation

## 2020-09-19 DIAGNOSIS — N179 Acute kidney failure, unspecified: Secondary | ICD-10-CM | POA: Diagnosis not present

## 2020-09-19 DIAGNOSIS — M4802 Spinal stenosis, cervical region: Secondary | ICD-10-CM | POA: Diagnosis not present

## 2020-09-19 DIAGNOSIS — Z471 Aftercare following joint replacement surgery: Secondary | ICD-10-CM | POA: Diagnosis not present

## 2020-09-19 DIAGNOSIS — K219 Gastro-esophageal reflux disease without esophagitis: Secondary | ICD-10-CM

## 2020-09-19 DIAGNOSIS — G8918 Other acute postprocedural pain: Secondary | ICD-10-CM | POA: Diagnosis not present

## 2020-09-19 DIAGNOSIS — S42201A Unspecified fracture of upper end of right humerus, initial encounter for closed fracture: Secondary | ICD-10-CM | POA: Diagnosis not present

## 2020-09-19 DIAGNOSIS — Z91040 Latex allergy status: Secondary | ICD-10-CM | POA: Insufficient documentation

## 2020-09-19 HISTORY — PX: REVERSE SHOULDER ARTHROPLASTY: SHX5054

## 2020-09-19 HISTORY — DX: Presence of right artificial shoulder joint: Z96.611

## 2020-09-19 LAB — BASIC METABOLIC PANEL
Anion gap: 11 (ref 5–15)
BUN: 35 mg/dL — ABNORMAL HIGH (ref 8–23)
CO2: 24 mmol/L (ref 22–32)
Calcium: 9.4 mg/dL (ref 8.9–10.3)
Chloride: 104 mmol/L (ref 98–111)
Creatinine, Ser: 1.34 mg/dL — ABNORMAL HIGH (ref 0.44–1.00)
GFR, Estimated: 41 mL/min — ABNORMAL LOW (ref >60)
Glucose, Bld: 169 mg/dL — ABNORMAL HIGH (ref 70–99)
Potassium: 4.2 mmol/L (ref 3.5–5.1)
Sodium: 139 mmol/L (ref 135–145)

## 2020-09-19 LAB — CBC
HCT: 28.3 % — ABNORMAL LOW (ref 36.0–46.0)
HCT: 25.5 % — ABNORMAL LOW (ref 36.0–46.0)
Hemoglobin: 9.6 g/dL — ABNORMAL LOW (ref 12.0–15.0)
Hemoglobin: 8.5 g/dL — ABNORMAL LOW (ref 12.0–15.0)
MCH: 27.7 pg (ref 26.0–34.0)
MCH: 27.1 pg (ref 26.0–34.0)
MCHC: 33.9 g/dL (ref 30.0–36.0)
MCHC: 33.3 g/dL (ref 30.0–36.0)
MCV: 81.6 fL (ref 80.0–100.0)
MCV: 81.2 fL (ref 80.0–100.0)
Platelets: 168 10*3/uL (ref 150–400)
Platelets: 166 10*3/uL (ref 150–400)
RBC: 3.47 MIL/uL — ABNORMAL LOW (ref 3.87–5.11)
RBC: 3.14 MIL/uL — ABNORMAL LOW (ref 3.87–5.11)
RDW: 15.8 % — ABNORMAL HIGH (ref 11.5–15.5)
RDW: 15.9 % — ABNORMAL HIGH (ref 11.5–15.5)
WBC: 8.5 10*3/uL (ref 4.0–10.5)
WBC: 9.3 10*3/uL (ref 4.0–10.5)
nRBC: 0 % (ref 0.0–0.2)
nRBC: 0 % (ref 0.0–0.2)

## 2020-09-19 LAB — SURGICAL PCR SCREEN
MRSA, PCR: NEGATIVE
Staphylococcus aureus: NEGATIVE

## 2020-09-19 LAB — GLUCOSE, CAPILLARY
Glucose-Capillary: 179 mg/dL — ABNORMAL HIGH (ref 70–99)
Glucose-Capillary: 134 mg/dL — ABNORMAL HIGH (ref 70–99)
Glucose-Capillary: 260 mg/dL — ABNORMAL HIGH (ref 70–99)
Glucose-Capillary: 287 mg/dL — ABNORMAL HIGH (ref 70–99)

## 2020-09-19 LAB — HEMOGLOBIN A1C
Hgb A1c MFr Bld: 6.6 % — ABNORMAL HIGH (ref 4.8–5.6)
Mean Plasma Glucose: 142.72 mg/dL

## 2020-09-19 SURGERY — ARTHROPLASTY, SHOULDER, TOTAL, REVERSE
Anesthesia: General | Site: Shoulder | Laterality: Right

## 2020-09-19 MED ORDER — INSULIN ASPART 100 UNIT/ML ~~LOC~~ SOLN
0.0000 [IU] | Freq: Every day | SUBCUTANEOUS | Status: DC
  Administered 2020-09-19 – 2020-09-20 (×2): 3 [IU] via SUBCUTANEOUS
  Administered 2020-09-21 – 2020-09-23 (×2): 2 [IU] via SUBCUTANEOUS

## 2020-09-19 MED ORDER — ONDANSETRON HCL 4 MG/2ML IJ SOLN: 4.0000 mg | Freq: Four times a day (QID) | INTRAMUSCULAR | Status: DC | PRN

## 2020-09-19 MED ORDER — LIDOCAINE 2% (20 MG/ML) 5 ML SYRINGE
INTRAMUSCULAR | Status: AC
  Filled 2020-09-19: qty 5

## 2020-09-19 MED ORDER — DEXAMETHASONE SODIUM PHOSPHATE 10 MG/ML IJ SOLN
INTRAMUSCULAR | Status: DC | PRN
  Administered 2020-09-19: 10 mg via INTRAVENOUS

## 2020-09-19 MED ORDER — DOCUSATE SODIUM 100 MG PO CAPS
100.0000 mg | ORAL_CAPSULE | Freq: Two times a day (BID) | ORAL | Status: DC
  Administered 2020-09-19 – 2020-09-25 (×8): 100 mg via ORAL
  Filled 2020-09-19 (×14): qty 1

## 2020-09-19 MED ORDER — STERILE WATER FOR IRRIGATION IR SOLN
Status: DC | PRN
  Administered 2020-09-19: 2000 mL

## 2020-09-19 MED ORDER — INSULIN ASPART 100 UNIT/ML ~~LOC~~ SOLN
0.0000 [IU] | Freq: Three times a day (TID) | SUBCUTANEOUS | Status: DC
  Administered 2020-09-20: 7 [IU] via SUBCUTANEOUS
  Administered 2020-09-22: 4 [IU] via SUBCUTANEOUS
  Administered 2020-09-24: 3 [IU] via SUBCUTANEOUS
  Administered 2020-09-25: 7 [IU] via SUBCUTANEOUS
  Administered 2020-09-26: 4 [IU] via SUBCUTANEOUS
  Administered 2020-09-26 (×2): 3 [IU] via SUBCUTANEOUS

## 2020-09-19 MED ORDER — HYDROMORPHONE HCL 1 MG/ML IJ SOLN
0.5000 mg | INTRAMUSCULAR | Status: DC | PRN
Start: 2020-09-19 — End: 2020-09-21

## 2020-09-19 MED ORDER — LINAGLIPTIN 5 MG PO TABS
5.0000 mg | ORAL_TABLET | Freq: Every day | ORAL | Status: DC
  Administered 2020-09-22 – 2020-09-24 (×3): 5 mg via ORAL
  Filled 2020-09-19 (×6): qty 1

## 2020-09-19 MED ORDER — PROPOFOL 10 MG/ML IV BOLUS
INTRAVENOUS | Status: DC | PRN
  Administered 2020-09-19: 120 mg via INTRAVENOUS

## 2020-09-19 MED ORDER — CEFAZOLIN SODIUM-DEXTROSE 2-4 GM/100ML-% IV SOLN
2.0000 g | INTRAVENOUS | Status: AC
  Administered 2020-09-19: 2 g via INTRAVENOUS
  Filled 2020-09-19: qty 100

## 2020-09-19 MED ORDER — METFORMIN HCL ER 500 MG PO TB24: 500.0000 mg | ORAL_TABLET | Freq: Two times a day (BID) | ORAL | Status: DC

## 2020-09-19 MED ORDER — ORAL CARE MOUTH RINSE: 15.0000 mL | Freq: Once | OROMUCOSAL | Status: AC

## 2020-09-19 MED ORDER — OXYCODONE HCL 5 MG PO TABS: 5.0000 mg | ORAL_TABLET | Freq: Once | ORAL | Status: DC | PRN

## 2020-09-19 MED ORDER — ACETAMINOPHEN 500 MG PO TABS
1000.0000 mg | ORAL_TABLET | Freq: Three times a day (TID) | ORAL | Status: AC
  Administered 2020-09-19 – 2020-09-23 (×10): 1000 mg via ORAL
  Filled 2020-09-19 (×10): qty 2

## 2020-09-19 MED ORDER — FENTANYL CITRATE (PF) 100 MCG/2ML IJ SOLN
25.0000 ug | INTRAMUSCULAR | Status: DC | PRN
  Administered 2020-09-19 (×2): 50 ug via INTRAVENOUS

## 2020-09-19 MED ORDER — SODIUM CHLORIDE 0.9 % IR SOLN
Status: DC | PRN
  Administered 2020-09-19: 1000 mL

## 2020-09-19 MED ORDER — 0.9 % SODIUM CHLORIDE (POUR BTL) OPTIME
TOPICAL | Status: DC | PRN
  Administered 2020-09-19: 1000 mL

## 2020-09-19 MED ORDER — PIOGLITAZONE HCL 45 MG PO TABS
45.0000 mg | ORAL_TABLET | Freq: Every day | ORAL | Status: DC
  Filled 2020-09-19: qty 1

## 2020-09-19 MED ORDER — FLUTICASONE PROPIONATE 50 MCG/ACT NA SUSP
1.0000 | Freq: Every day | NASAL | Status: DC
  Administered 2020-09-22 – 2020-09-27 (×5): 1 via NASAL
  Filled 2020-09-19: qty 16

## 2020-09-19 MED ORDER — METHOCARBAMOL 1000 MG/10ML IJ SOLN
500.0000 mg | Freq: Four times a day (QID) | INTRAVENOUS | Status: DC | PRN
  Filled 2020-09-19: qty 5

## 2020-09-19 MED ORDER — VANCOMYCIN HCL 1 G IV SOLR
INTRAVENOUS | Status: DC | PRN
  Administered 2020-09-19: 1000 mg via TOPICAL

## 2020-09-19 MED ORDER — DIPHENHYDRAMINE HCL 12.5 MG/5ML PO ELIX
12.5000 mg | ORAL_SOLUTION | ORAL | Status: DC | PRN
  Administered 2020-09-21 – 2020-09-27 (×4): 12.5 mg via ORAL
  Filled 2020-09-19 (×2): qty 5
  Filled 2020-09-19: qty 10
  Filled 2020-09-19: qty 5

## 2020-09-19 MED ORDER — OXYCODONE HCL 5 MG PO TABS
5.0000 mg | ORAL_TABLET | ORAL | Status: DC | PRN
  Administered 2020-09-20 – 2020-09-23 (×8): 5 mg via ORAL
  Filled 2020-09-19 (×8): qty 1

## 2020-09-19 MED ORDER — SPIRONOLACTONE 12.5 MG HALF TABLET
12.5000 mg | ORAL_TABLET | Freq: Every day | ORAL | Status: DC
  Administered 2020-09-20: 12.5 mg via ORAL
  Filled 2020-09-19 (×8): qty 1

## 2020-09-19 MED ORDER — BISACODYL 10 MG RE SUPP: 10.0000 mg | Freq: Every day | RECTAL | Status: DC | PRN

## 2020-09-19 MED ORDER — MOMETASONE FURO-FORMOTEROL FUM 200-5 MCG/ACT IN AERO
2.0000 | INHALATION_SPRAY | Freq: Two times a day (BID) | RESPIRATORY_TRACT | Status: DC
  Filled 2020-09-19: qty 8.8

## 2020-09-19 MED ORDER — FAMOTIDINE 20 MG PO TABS
20.0000 mg | ORAL_TABLET | Freq: Every day | ORAL | Status: DC
  Administered 2020-09-19 – 2020-09-26 (×7): 20 mg via ORAL
  Filled 2020-09-19 (×7): qty 1

## 2020-09-19 MED ORDER — HYDROCHLOROTHIAZIDE 25 MG PO TABS
12.5000 mg | ORAL_TABLET | Freq: Every day | ORAL | Status: DC
  Administered 2020-09-20: 12.5 mg via ORAL
  Filled 2020-09-19 (×7): qty 1

## 2020-09-19 MED ORDER — METOPROLOL SUCCINATE ER 50 MG PO TB24
50.0000 mg | ORAL_TABLET | Freq: Every day | ORAL | Status: DC
  Administered 2020-09-20 – 2020-09-27 (×7): 50 mg via ORAL
  Filled 2020-09-19 (×7): qty 1

## 2020-09-19 MED ORDER — FENTANYL CITRATE (PF) 100 MCG/2ML IJ SOLN
INTRAMUSCULAR | Status: AC
  Filled 2020-09-19: qty 2

## 2020-09-19 MED ORDER — OXYCODONE HCL 5 MG/5ML PO SOLN
5.0000 mg | Freq: Once | ORAL | Status: DC | PRN
Start: 2020-09-19 — End: 2020-09-19

## 2020-09-19 MED ORDER — AMISULPRIDE (ANTIEMETIC) 5 MG/2ML IV SOLN: 10.0000 mg | Freq: Once | INTRAVENOUS | Status: DC | PRN

## 2020-09-19 MED ORDER — ROCURONIUM BROMIDE 10 MG/ML (PF) SYRINGE
PREFILLED_SYRINGE | INTRAVENOUS | Status: DC | PRN
  Administered 2020-09-19: 60 mg via INTRAVENOUS
  Administered 2020-09-19: 10 mg via INTRAVENOUS

## 2020-09-19 MED ORDER — TRANEXAMIC ACID-NACL 1000-0.7 MG/100ML-% IV SOLN
1000.0000 mg | INTRAVENOUS | Status: AC
  Administered 2020-09-19: 1000 mg via INTRAVENOUS
  Filled 2020-09-19: qty 100

## 2020-09-19 MED ORDER — ACETAMINOPHEN 500 MG PO TABS
1000.0000 mg | ORAL_TABLET | Freq: Once | ORAL | Status: AC
  Administered 2020-09-19: 1000 mg via ORAL
  Filled 2020-09-19: qty 2

## 2020-09-19 MED ORDER — VANCOMYCIN HCL 1000 MG IV SOLR
INTRAVENOUS | Status: AC
  Filled 2020-09-19: qty 1000

## 2020-09-19 MED ORDER — MAGNESIUM CITRATE PO SOLN: 1.0000 | Freq: Once | ORAL | Status: DC | PRN

## 2020-09-19 MED ORDER — OXYCODONE HCL 5 MG PO TABS
10.0000 mg | ORAL_TABLET | ORAL | Status: DC | PRN
  Administered 2020-09-20 (×2): 10 mg via ORAL
  Filled 2020-09-19 (×2): qty 2

## 2020-09-19 MED ORDER — FENTANYL CITRATE (PF) 100 MCG/2ML IJ SOLN
INTRAMUSCULAR | Status: AC
  Administered 2020-09-19: 100 ug
  Filled 2020-09-19: qty 2

## 2020-09-19 MED ORDER — DEXAMETHASONE SODIUM PHOSPHATE 10 MG/ML IJ SOLN
INTRAMUSCULAR | Status: AC
  Filled 2020-09-19: qty 1

## 2020-09-19 MED ORDER — METHOCARBAMOL 500 MG PO TABS
500.0000 mg | ORAL_TABLET | Freq: Four times a day (QID) | ORAL | Status: DC | PRN
Start: 2020-09-19 — End: 2020-09-21
  Administered 2020-09-20 (×2): 500 mg via ORAL
  Filled 2020-09-19 (×2): qty 1

## 2020-09-19 MED ORDER — METOCLOPRAMIDE HCL 5 MG/ML IJ SOLN
5.0000 mg | Freq: Three times a day (TID) | INTRAMUSCULAR | Status: DC | PRN
Start: 2020-09-19 — End: 2020-09-27

## 2020-09-19 MED ORDER — ONDANSETRON HCL 4 MG/2ML IJ SOLN
INTRAMUSCULAR | Status: DC | PRN
  Administered 2020-09-19: 4 mg via INTRAVENOUS

## 2020-09-19 MED ORDER — PRAVASTATIN SODIUM 20 MG PO TABS
40.0000 mg | ORAL_TABLET | Freq: Every day | ORAL | Status: DC
  Administered 2020-09-19 – 2020-09-27 (×9): 40 mg via ORAL
  Filled 2020-09-19 (×9): qty 2

## 2020-09-19 MED ORDER — METOCLOPRAMIDE HCL 5 MG PO TABS
5.0000 mg | ORAL_TABLET | Freq: Three times a day (TID) | ORAL | Status: DC | PRN
Start: 2020-09-19 — End: 2020-09-27

## 2020-09-19 MED ORDER — PROPOFOL 10 MG/ML IV BOLUS
INTRAVENOUS | Status: AC
  Filled 2020-09-19: qty 20

## 2020-09-19 MED ORDER — PHENYLEPHRINE HCL-NACL 10-0.9 MG/250ML-% IV SOLN
INTRAVENOUS | Status: DC | PRN
  Administered 2020-09-19: 50 ug/min via INTRAVENOUS

## 2020-09-19 MED ORDER — ONDANSETRON HCL 4 MG/2ML IJ SOLN: 4.0000 mg | Freq: Once | INTRAMUSCULAR | Status: DC | PRN

## 2020-09-19 MED ORDER — ONDANSETRON HCL 4 MG/2ML IJ SOLN
INTRAMUSCULAR | Status: AC
  Filled 2020-09-19: qty 2

## 2020-09-19 MED ORDER — SUGAMMADEX SODIUM 200 MG/2ML IV SOLN
INTRAVENOUS | Status: DC | PRN
  Administered 2020-09-19: 200 mg via INTRAVENOUS

## 2020-09-19 MED ORDER — ONDANSETRON HCL 4 MG PO TABS: 4.0000 mg | ORAL_TABLET | Freq: Four times a day (QID) | ORAL | Status: DC | PRN

## 2020-09-19 MED ORDER — BUPIVACAINE LIPOSOME 1.3 % IJ SUSP
INTRAMUSCULAR | Status: DC | PRN
  Administered 2020-09-19: 10 mL

## 2020-09-19 MED ORDER — METFORMIN HCL ER 500 MG PO TB24
500.0000 mg | ORAL_TABLET | Freq: Two times a day (BID) | ORAL | Status: DC
  Administered 2020-09-20 – 2020-09-26 (×8): 500 mg via ORAL
  Filled 2020-09-19 (×11): qty 1

## 2020-09-19 MED ORDER — GLIPIZIDE ER 5 MG PO TB24
5.0000 mg | ORAL_TABLET | Freq: Every day | ORAL | Status: DC
  Administered 2020-09-20 – 2020-09-26 (×7): 5 mg via ORAL
  Filled 2020-09-19 (×7): qty 1

## 2020-09-19 MED ORDER — LACTATED RINGERS IV SOLN: INTRAVENOUS | Status: DC

## 2020-09-19 MED ORDER — PHENOL 1.4 % MT LIQD: 1.0000 | OROMUCOSAL | Status: DC | PRN

## 2020-09-19 MED ORDER — ESMOLOL HCL 100 MG/10ML IV SOLN
INTRAVENOUS | Status: AC
  Filled 2020-09-19: qty 10

## 2020-09-19 MED ORDER — ROCURONIUM BROMIDE 10 MG/ML (PF) SYRINGE
PREFILLED_SYRINGE | INTRAVENOUS | Status: AC
  Filled 2020-09-19: qty 10

## 2020-09-19 MED ORDER — FENTANYL CITRATE (PF) 100 MCG/2ML IJ SOLN
INTRAMUSCULAR | Status: DC | PRN
  Administered 2020-09-19: 25 ug via INTRAVENOUS
  Administered 2020-09-19: 50 ug via INTRAVENOUS
  Administered 2020-09-19: 25 ug via INTRAVENOUS

## 2020-09-19 MED ORDER — ESMOLOL HCL 100 MG/10ML IV SOLN
INTRAVENOUS | Status: DC | PRN
Start: 2020-09-19 — End: 2020-09-19
  Administered 2020-09-19: 30 mg via INTRAVENOUS

## 2020-09-19 MED ORDER — CHLORHEXIDINE GLUCONATE 0.12 % MT SOLN
15.0000 mL | Freq: Once | OROMUCOSAL | Status: AC
  Administered 2020-09-19: 15 mL via OROMUCOSAL

## 2020-09-19 MED ORDER — CEFAZOLIN SODIUM-DEXTROSE 2-4 GM/100ML-% IV SOLN
2.0000 g | Freq: Four times a day (QID) | INTRAVENOUS | Status: AC
  Administered 2020-09-19 – 2020-09-20 (×3): 2 g via INTRAVENOUS
  Filled 2020-09-19 (×3): qty 100

## 2020-09-19 MED ORDER — MENTHOL 3 MG MT LOZG: 1.0000 | LOZENGE | OROMUCOSAL | Status: DC | PRN

## 2020-09-19 MED ORDER — BUPIVACAINE HCL (PF) 0.5 % IJ SOLN
INTRAMUSCULAR | Status: DC | PRN
  Administered 2020-09-19: 15 mL via PERINEURAL

## 2020-09-19 MED ORDER — POLYETHYLENE GLYCOL 3350 17 G PO PACK: 17.0000 g | PACK | Freq: Every day | ORAL | Status: DC | PRN

## 2020-09-19 MED ORDER — BENAZEPRIL HCL 20 MG PO TABS
40.0000 mg | ORAL_TABLET | Freq: Every day | ORAL | Status: DC
  Administered 2020-09-20 – 2020-09-27 (×7): 40 mg via ORAL
  Filled 2020-09-19 (×7): qty 2

## 2020-09-19 MED ORDER — LIDOCAINE 2% (20 MG/ML) 5 ML SYRINGE
INTRAMUSCULAR | Status: DC | PRN
  Administered 2020-09-19: 60 mg via INTRAVENOUS

## 2020-09-19 SURGICAL SUPPLY — 70 items
AID PSTN UNV HD RSTRNT DISP (MISCELLANEOUS) ×1
APL PRP STRL LF DISP 70% ISPRP (MISCELLANEOUS) ×2
BASEPLATE GLENOSPHERE 25 STD (Miscellaneous) ×2 IMPLANT
BIT DRILL 3.2 PERIPHERAL SCREW (BIT) ×1 IMPLANT
BLADE SAW SAG 73X25 THK (BLADE) ×1
BLADE SAW SGTL 73X25 THK (BLADE) ×1 IMPLANT
BODY PROXIMAL PTC 13X132.5 (Joint) IMPLANT
BSPLAT GLND STD 25 RVRS SHLDR (Miscellaneous) ×1 IMPLANT
CAP LOCKING COCR (Cap) ×1 IMPLANT
CHLORAPREP W/TINT 26 (MISCELLANEOUS) ×4 IMPLANT
CLSR STERI-STRIP ANTIMIC 1/2X4 (GAUZE/BANDAGES/DRESSINGS) ×2 IMPLANT
COOLER ICEMAN CLASSIC (MISCELLANEOUS) IMPLANT
COVER BACK TABLE 60X90IN (DRAPES) IMPLANT
COVER SURGICAL LIGHT HANDLE (MISCELLANEOUS) ×2 IMPLANT
COVER WAND RF STERILE (DRAPES) ×2 IMPLANT
DRAPE INCISE IOBAN 66X45 STRL (DRAPES) ×2 IMPLANT
DRAPE ORTHO SPLIT 77X108 STRL (DRAPES) ×4
DRAPE SHEET LG 3/4 BI-LAMINATE (DRAPES) ×4 IMPLANT
DRAPE SURG ORHT 6 SPLT 77X108 (DRAPES) ×2 IMPLANT
DRSG AQUACEL AG ADV 3.5X 6 (GAUZE/BANDAGES/DRESSINGS) ×2 IMPLANT
ELECT BLADE TIP CTD 4 INCH (ELECTRODE) ×2 IMPLANT
ELECT REM PT RETURN 15FT ADLT (MISCELLANEOUS) ×2 IMPLANT
GLENOSPHERE REV SHOULDER 36 (Joint) ×1 IMPLANT
GLOVE SRG 8 PF TXTR STRL LF DI (GLOVE) ×1 IMPLANT
GLOVE SURG ENC MOIS LTX SZ6.5 (GLOVE) ×4 IMPLANT
GLOVE SURG LTX SZ8 (GLOVE) ×4 IMPLANT
GLOVE SURG UNDER POLY LF SZ6.5 (GLOVE) ×2 IMPLANT
GLOVE SURG UNDER POLY LF SZ8 (GLOVE) ×2
GOWN STRL REUS W/TWL LRG LVL3 (GOWN DISPOSABLE) ×2 IMPLANT
GOWN STRL REUS W/TWL XL LVL3 (GOWN DISPOSABLE) ×2 IMPLANT
GUIDEWIRE GLENOID 2.5X220 (WIRE) ×2 IMPLANT
HANDPIECE INTERPULSE COAX TIP (DISPOSABLE) ×2
HEMOSTAT SURGICEL 2X14 (HEMOSTASIS) IMPLANT
IMPL REVERSE SHOULDER 0X3.5 (Shoulder) IMPLANT
IMPLANT REVERSE SHOULDER 0X3.5 (Shoulder) ×2 IMPLANT
INSERT HUMERAL 36X6MM 12.5DEG (Insert) ×1 IMPLANT
KIT BASIN OR (CUSTOM PROCEDURE TRAY) ×2 IMPLANT
KIT STABILIZATION SHOULDER (MISCELLANEOUS) ×2 IMPLANT
KIT TURNOVER KIT A (KITS) ×2 IMPLANT
MANIFOLD NEPTUNE II (INSTRUMENTS) ×2 IMPLANT
NEEDLE MAYO CATGUT SZ4 (NEEDLE) IMPLANT
NS IRRIG 1000ML POUR BTL (IV SOLUTION) ×2 IMPLANT
PACK SHOULDER (CUSTOM PROCEDURE TRAY) ×2 IMPLANT
PAD COLD SHLDR WRAP-ON (PAD) IMPLANT
PENCIL SMOKE EVACUATOR (MISCELLANEOUS) IMPLANT
PROXIMAL BODY PTC 13X132.5 (Joint) ×2 IMPLANT
PRT COAT DISTL STEM 13 SHOUL (Miscellaneous) ×2 IMPLANT
RESTRAINT HEAD UNIVERSAL NS (MISCELLANEOUS) ×2 IMPLANT
SCREW 5.5X22 (Screw) ×1 IMPLANT
SCREW BONE 6.5X40 SM (Screw) ×1 IMPLANT
SCREW PERIPHERAL 30 (Screw) ×1 IMPLANT
SCREW SHLD ASSEMBLY AEQ 20 (Spacer) ×1 IMPLANT
SET HNDPC FAN SPRY TIP SCT (DISPOSABLE) ×1 IMPLANT
SLING ULTRA II L (ORTHOPEDIC SUPPLIES) IMPLANT
SLING ULTRA III MED (ORTHOPEDIC SUPPLIES) ×2 IMPLANT
SPACER SHLD CMT AEQ 13X20 (Spacer) ×1 IMPLANT
STEM SHLDR DIST PRTLY COATD 13 (Miscellaneous) IMPLANT
SUCTION FRAZIER HANDLE 12FR (TUBING) ×2
SUCTION TUBE FRAZIER 12FR DISP (TUBING) ×1 IMPLANT
SUT ETHIBOND 2 V 37 (SUTURE) ×2 IMPLANT
SUT ETHIBOND NAB CT1 #1 30IN (SUTURE) ×2 IMPLANT
SUT FIBERWIRE #5 38 CONV NDL (SUTURE) ×12
SUT MNCRL AB 4-0 PS2 18 (SUTURE) ×2 IMPLANT
SUT VIC AB 0 CT1 36 (SUTURE) IMPLANT
SUT VIC AB 3-0 SH 27 (SUTURE) ×2
SUT VIC AB 3-0 SH 27X BRD (SUTURE) ×1 IMPLANT
SUTURE FIBERWR #5 38 CONV NDL (SUTURE) IMPLANT
TOWEL OR 17X26 10 PK STRL BLUE (TOWEL DISPOSABLE) ×2 IMPLANT
TUBE SUCTION HIGH CAP CLEAR NV (SUCTIONS) ×2 IMPLANT
WATER STERILE IRR 1000ML POUR (IV SOLUTION) ×4 IMPLANT

## 2020-09-19 NOTE — Interval H&P Note (Signed)
All questions answered

## 2020-09-19 NOTE — Anesthesia Postprocedure Evaluation (Signed)
Anesthesia Post Note  Patient: Doris Jackson  Procedure(s) Performed: REVERSE SHOULDER ARTHROPLASTY (Right Shoulder)     Patient location during evaluation: PACU Anesthesia Type: General and Regional Level of consciousness: awake and alert Pain management: pain level controlled Vital Signs Assessment: post-procedure vital signs reviewed and stable Respiratory status: spontaneous breathing, nonlabored ventilation, respiratory function stable and patient connected to nasal cannula oxygen Cardiovascular status: blood pressure returned to baseline and stable Postop Assessment: no apparent nausea or vomiting Anesthetic complications: no   No complications documented.  Last Vitals:  Vitals:   09/19/20 1400 09/19/20 1419  BP: 126/80 (!) 142/92  Pulse: 92 93  Resp: 15 18  Temp: 36.4 C (!) 36.4 C  SpO2: 100% 100%    Last Pain:  Vitals:   09/19/20 1419  TempSrc: Oral  PainSc:                  Ambrea Hegler,W. EDMOND

## 2020-09-19 NOTE — Transfer of Care (Signed)
Immediate Anesthesia Transfer of Care Note  Patient: Doris Jackson  Procedure(s) Performed: REVERSE SHOULDER ARTHROPLASTY (Right Shoulder)  Patient Location: PACU  Anesthesia Type:GA combined with regional for post-op pain  Level of Consciousness: awake and alert   Airway & Oxygen Therapy: Patient Spontanous Breathing and Patient connected to face mask oxygen  Post-op Assessment: Report given to RN and Post -op Vital signs reviewed and stable  Post vital signs: Reviewed and stable  Last Vitals:  Vitals Value Taken Time  BP 158/76 09/19/20 1300  Temp    Pulse 99 09/19/20 1300  Resp 16 09/19/20 1300  SpO2 100 % 09/19/20 1300  Vitals shown include unvalidated device data.  Last Pain:  Vitals:   09/19/20 0818  TempSrc: Oral  PainSc: 4       Patients Stated Pain Goal: 3 (04/25/24 3664)  Complications: No complications documented.

## 2020-09-19 NOTE — Anesthesia Procedure Notes (Signed)
Anesthesia Regional Block: Interscalene brachial plexus block   Pre-Anesthetic Checklist: ,, timeout performed, Correct Patient, Correct Site, Correct Laterality, Correct Procedure, Correct Position, site marked, Risks and benefits discussed,  Surgical consent,  Pre-op evaluation,  At surgeon's request and post-op pain management  Laterality: Right  Prep: chloraprep       Needles:  Injection technique: Single-shot  Needle Type: Echogenic Stimulator Needle     Needle Length: 10cm  Needle Gauge: 20     Additional Needles:   Procedures:,,,, ultrasound used (permanent image in chart),,,,  Narrative:  Start time: 09/19/2020 9:25 AM End time: 09/19/2020 9:29 AM Injection made incrementally with aspirations every 5 mL.  Performed by: Personally  Anesthesiologist: Lidia Collum, MD  Additional Notes: Standard monitors applied. Skin prepped. Good needle visualization with ultrasound. Injection made in 5cc increments with no resistance to injection. Patient tolerated the procedure well.

## 2020-09-19 NOTE — Anesthesia Preprocedure Evaluation (Addendum)
Anesthesia Evaluation  Patient identified by MRN, date of birth, ID band Patient awake    Reviewed: Allergy & Precautions, NPO status , Patient's Chart, lab work & pertinent test results  History of Anesthesia Complications Negative for: history of anesthetic complications  Airway Mallampati: II  TM Distance: >3 FB Neck ROM: Full    Dental  (+) Teeth Intact   Pulmonary COPD,    Pulmonary exam normal        Cardiovascular hypertension, Normal cardiovascular exam     Neuro/Psych    GI/Hepatic Neg liver ROS, GERD  ,  Endo/Other  diabetes, Type 2Morbid obesity  Renal/GU negative Renal ROS  negative genitourinary   Musculoskeletal  (+) Arthritis , Osteoarthritis,    Abdominal   Peds  Hematology  (+) anemia ,   Anesthesia Other Findings   Reproductive/Obstetrics                           Anesthesia Physical Anesthesia Plan  ASA: III  Anesthesia Plan: General   Post-op Pain Management: GA combined w/ Regional for post-op pain   Induction: Intravenous  PONV Risk Score and Plan: 3 and Ondansetron, Dexamethasone, Treatment may vary due to age or medical condition and Midazolam  Airway Management Planned: Oral ETT  Additional Equipment: None  Intra-op Plan:   Post-operative Plan: Extubation in OR  Informed Consent: I have reviewed the patients History and Physical, chart, labs and discussed the procedure including the risks, benefits and alternatives for the proposed anesthesia with the patient or authorized representative who has indicated his/her understanding and acceptance.     Dental advisory given  Plan Discussed with:   Anesthesia Plan Comments:         Anesthesia Quick Evaluation

## 2020-09-19 NOTE — Telephone Encounter (Signed)
The patients husband called to see about getting Dr. Ethlyn Gallery to write him a Rx for a new BP cuff to take to the medical supply store to get a big enough cuff for the patient's arm. She is getting released from the hospital today and is needing to keep track of her BP.  Please advise

## 2020-09-19 NOTE — Progress Notes (Signed)
AssistedDr. Bryson Ha with right, ultrasound guided, interscalene  block. Side rails up, monitors on throughout procedure. See vital signs in flow sheet. Tolerated Procedure well.

## 2020-09-19 NOTE — Anesthesia Procedure Notes (Signed)
Procedure Name: Intubation Date/Time: 09/19/2020 10:48 AM Performed by: Sharlette Dense, CRNA Patient Re-evaluated:Patient Re-evaluated prior to induction Oxygen Delivery Method: Circle system utilized Preoxygenation: Pre-oxygenation with 100% oxygen Induction Type: IV induction Ventilation: Mask ventilation without difficulty and Oral airway inserted - appropriate to patient size Laryngoscope Size: Sabra Heck and 2 Grade View: Grade I Tube type: Oral Tube size: 7.5 mm Number of attempts: 1 Airway Equipment and Method: Stylet Placement Confirmation: ETT inserted through vocal cords under direct vision,  positive ETCO2 and breath sounds checked- equal and bilateral Secured at: 22 cm Tube secured with: Tape Dental Injury: Teeth and Oropharynx as per pre-operative assessment

## 2020-09-19 NOTE — Op Note (Signed)
Orthopaedic Surgery Operative Note (CSN: [id number])  Doris Jackson  04-13-1941 Date of Surgery: 09/19/2020   Diagnoses:  Right proximal humerus fracture three-part with significant displacement  Procedure: Right reverse total Shoulder Arthroplasty   Operative Finding Successful completion of planned procedure.  Patient's fracture was extremely proximal and involve the inferior aspect of the articular surface.  The subscapularis was off however the greater tuberosity was still attached.  We osteotomized this and we noted that there was a extension through the attachment of the pec but the remainder of the humerus was intact.  We used longstem implants and got great fixation in the stem.  Due to the patient's significantly large habitus of her arm she is at high risk for dislocation as well as the fact that her axillary nerve had some paresthesias at least preoperatively.  Her nerve was intact during the case.  Post-operative plan: The patient will be NWB in sling.  The patient will be will be admitted to observation due to medical complexity, monitoring and pain management.  DVT prophylaxis Aspirin 81 mg twice daily for 6 weeks.  Pain control with PRN pain medication preferring oral medicines.  Follow up plan will be scheduled in approximately 7 days for incision check and XR.  Physical therapy to start after 4 weeks.  Implants: Tornier revive 13 partially coated distal stem, 20 mm spacer, 13 proximal body, 0 high offset tray with a 6 mm polyethylene, 36 standard glenosphere and 25 standard baseplate with a 40 center screw.  Post-Op Diagnosis: Same Surgeons:Primary: Doris Gash, MD Assistants:Caroline McBane PA-C Location: [facility] Anesthesia: General with Exparel Interscalene Antibiotics: Ancef 2g preop, Vancomycin 1050m locally Tourniquet time: None Estimated Blood Loss: 2212Complications: None Specimens: None Implants: Implant Name Type Inv. Item Serial No. Manufacturer Lot No.  LRB No. Used Action  BASEPLATE GLENOSPHERE 224MGSTD - [id number] BASEPLATE GLENOSPHERE 291QXSTD [id number] [employer] N/A Right 1 Implanted  SCREW BONE 6.5X40 SM - [id number] SCREW BONE 6.5X40 SM  [employer]  Right 1 Implanted  GLENOSPHERE REV SHOULDER 36 - [id number] GLENOSPHERE REV SHOULDER 36 [id number] N/A Right 1 Implanted  SCREW 5.5X22 - SN/A Screw SCREW 5.5X22 N/A [employer] N/A Right 1 Implanted  SCREW PERIPHERAL 30 - SN/A Screw SCREW PERIPHERAL 30 N/A [employer] N/A Right 1 Implanted  SPACER AEQUALIS 13X20 - [id number] SPACER AEQUALIS 13X20 [id number]  Right 1 Implanted  PROXIMAL BODY PTC 13X132.5 - [id number] PROXIMAL BODY PTC 13X132.5 [id number]  Right 1 Implanted  PRT COAT DISTL STEM 13 SHOUL - [id number] PRT COAT DISTL STEM 13 SHOUL [id number]  Right 1 Implanted  CAP LOCKING COCR - [id number] CAP LOCKING COCR [id number]  Right 1 Implanted  SPACER REV AEQUALIS 11 - [id number] SPACER REV AEQUALIS 11 [id number]  Right 1 Implanted  IMPLANT REVERSE SHOULDER 0X3.5 - [id number] IMPLANT REVERSE SHOULDER 0X3.5 [id number]  Right 1 Implanted  INSERT HUMERAL 36X6MM 12.5DEG - [id number] INSERT HUMERAL 36X6MM 12.5DEG [id number]  Right 1 Implanted    Indications for Surgery:   HDHRUTI GHUMANis a 80y.o. female with fall resulting in a complex proximal humerus fracture with significant displacement and the patient with multiple medical comorbidities.  Benefits and risks of operative and nonoperative management were discussed prior to surgery with patient/guardian(s) and informed consent form was completed.  Infection and need for further surgery  were discussed as was prosthetic stability and cuff issues.  We additionally specifically discussed risks of axillary nerve injury, infection, periprosthetic  fracture, continued pain and longevity of implants prior to beginning procedure.      Procedure:   The patient was identified in the preoperative holding area where the surgical site was marked. Block placed by anesthesia with exparel.  The patient was taken to the OR where a procedural timeout was called and the above noted anesthesia was induced.  The patient was positioned beachchair on allen table with spider arm positioner.  Preoperative antibiotics were dosed.  The patient's right shoulder was prepped and draped in the usual sterile fashion.  A second preoperative timeout was called.       Standard deltopectoral approach was performed with a #10 blade. We dissected down to the subcutaneous tissues and the cephalic vein was taken laterally with the deltoid. Clavipectoral fascia was incised in line with the incision. Deep retractors were placed. The long of the biceps tendon was identified and there was significant tenosynovitis present.  Tenodesis was performed to the pectoralis tendon with #2 Ethibond. The remaining biceps was followed up into the rotator interval where it was released.    We used the bicipital groove as a landmark for the lesser and greater tuberosity fragments.  We were able to mobilize the lesser tuberosity fragment and placed stay sutures in the bone tendon junction to help with mobilization.  This point we were able to identify the  greater tuberosity fragment and 4 #5  FiberWire sutures were used to place into this for eventual repair of the tuberosities.  Once these were both mobilized we took care to identify the shaft fragment as well as the head fragment.  We carefully identified the head fragment were able to manually remove it.  At this point the axillary nerve was found and palpated and with a tug test noted to be intact.  Protected throughout the remainder of the case with blunt retractors.    We then released the SGHL with bovie cautery prior to placing a curved mayo  at the junction of the anterior glenoid well above the axillary nerve and bluntly dissecting the subscapularis from the capsule.  We then carefully protected the axillary nerve as we gently released the inferior capsule to fully mobilize the subscapularis.  An anterior deltoid retractor was then placed as well as a small Hohmann retractor superiorly.   The glenoid was relatively preserved as we would expect in this fracture patient.  The remaining labrum was removed circumferentially taking great care not to disrupt the posterior capsule.    The glenoid drill guide was placed and used to drill a guide pin in the center, inferior position. The glenoid face was then reamed concentrically over the guide wire. The center hole was drilled over the guidepin in a near anatomic angle of version. Next the glenoid vault was drilled back to a depth of 40 mm.  We tapped and then placed a 12m size baseplate with 0 lateralization was selected with a 6.5 mm x 466mlength central screw.  The base plate was screwed into the glenoid vault obtaining secure fixation. We next placed superior and inferior locking screws for additional fixation.  Next a 36 mm glenosphere was selected and impacted onto the baseplate. The center screw was tightened.   We then repositioned the arm to give access to the humeral shaft fragment.  Drill holes were placed and fiberwire sutures in the shaft for  vertical fixation of the tuberosities.  We broached with the Revive stem implants  starting with a size 9 reamer and reaming up to 13 which obtained an appropriate fit.  The proximal body was sized separately and attached to trial and achieve a stable articulation.    We trialed with multiple size tray and polyethylene options and selected a 0 high which provided good stability and range of motion without excess soft tissue tension. The offset was dialed in to match the normal anatomy. The shoulder was trialed.  There was good ROM in all planes and  the shoulder was stable with no inferior translation.   We then mobilized her tuberosities again and placed the anterior deep limbs of the 4 #5 fiber wires around the stem.  1 of these was tied down fixing the greater tuberosity in place after bone graft harvest from the humeral head component was placed underneath.  A +0 high offset tray was selected and impacted onto the stem.   A 36+6 polyethylene liner was impacted onto the stem.  The joint was reduced and thoroughly irrigated with pulsatile lavage. The remaining sutures were then placed through the subscapularis and the bone tendon junction and the tuberosities were reduced after bone graft placed beneath as autograft at the subscap.  We horizontally secured the tuberosities before placing vertical fixation with the suture that was placed into the shaft.  Tuberosities moved as a unit were happy with her overall reduction.  This was checked on fluoroscopy confirming our position.  We irrigated copiously at this point.  Hemostasis was obtained. The deltopectoral interval was reapproximated with #1 Ethibond. The subcutaneous tissues were closed with 3-0 Vicryl and the skin was closed with running monocryl.     The wounds were cleaned and dried and an Aquacel dressing was placed. The drapes taken down. The arm was placed into sling with abduction pillow. Patient was awakened, extubated, and transferred to the recovery room in stable condition. There were no intraoperative complications. The sponge, needle, and attention counts were correct at the end of the case.    Noemi Chapel, PA-C, present and scrubbed throughout the case, critical for completion in a timely fashion, and for retraction, instrumentation, closure.

## 2020-09-19 NOTE — Telephone Encounter (Signed)
Left a message for the pt to return my call.  

## 2020-09-20 ENCOUNTER — Encounter (HOSPITAL_COMMUNITY): Payer: Self-pay | Admitting: Orthopaedic Surgery

## 2020-09-20 DIAGNOSIS — S42201A Unspecified fracture of upper end of right humerus, initial encounter for closed fracture: Secondary | ICD-10-CM | POA: Diagnosis not present

## 2020-09-20 DIAGNOSIS — I10 Essential (primary) hypertension: Secondary | ICD-10-CM | POA: Diagnosis not present

## 2020-09-20 DIAGNOSIS — E119 Type 2 diabetes mellitus without complications: Secondary | ICD-10-CM | POA: Diagnosis not present

## 2020-09-20 DIAGNOSIS — W1830XA Fall on same level, unspecified, initial encounter: Secondary | ICD-10-CM | POA: Diagnosis not present

## 2020-09-20 DIAGNOSIS — Z20822 Contact with and (suspected) exposure to covid-19: Secondary | ICD-10-CM | POA: Diagnosis not present

## 2020-09-20 DIAGNOSIS — S42231A 3-part fracture of surgical neck of right humerus, initial encounter for closed fracture: Secondary | ICD-10-CM | POA: Diagnosis not present

## 2020-09-20 DIAGNOSIS — Z853 Personal history of malignant neoplasm of breast: Secondary | ICD-10-CM | POA: Diagnosis not present

## 2020-09-20 DIAGNOSIS — Z79899 Other long term (current) drug therapy: Secondary | ICD-10-CM | POA: Diagnosis not present

## 2020-09-20 DIAGNOSIS — Z7984 Long term (current) use of oral hypoglycemic drugs: Secondary | ICD-10-CM | POA: Diagnosis not present

## 2020-09-20 DIAGNOSIS — J449 Chronic obstructive pulmonary disease, unspecified: Secondary | ICD-10-CM | POA: Diagnosis not present

## 2020-09-20 LAB — GLUCOSE, CAPILLARY
Glucose-Capillary: 215 mg/dL — ABNORMAL HIGH (ref 70–99)
Glucose-Capillary: 245 mg/dL — ABNORMAL HIGH (ref 70–99)
Glucose-Capillary: 156 mg/dL — ABNORMAL HIGH (ref 70–99)
Glucose-Capillary: 252 mg/dL — ABNORMAL HIGH (ref 70–99)

## 2020-09-20 MED ORDER — PIOGLITAZONE HCL 30 MG PO TABS
45.0000 mg | ORAL_TABLET | Freq: Every day | ORAL | Status: DC
  Administered 2020-09-20 – 2020-09-26 (×7): 45 mg via ORAL
  Filled 2020-09-20 (×8): qty 1

## 2020-09-20 MED ORDER — SODIUM CHLORIDE 0.9 % IV BOLUS
250.0000 mL | Freq: Once | INTRAVENOUS | Status: AC
  Administered 2020-09-20: 250 mL via INTRAVENOUS

## 2020-09-20 NOTE — Progress Notes (Signed)
   ORTHOPAEDIC PROGRESS NOTE  s/p Procedure(s): REVERSE SHOULDER ARTHROPLASTY for fracture on 09/19/2020 with Dr. Griffin Basil  SUBJECTIVE: Reports some pain in the right shoulder. Arm still feels numb due to the nerve block. Using purewick and voiding well. No bowel movements yet. No chest pain. No SOB. No nausea/vomiting. No other complaints.  OBJECTIVE: PE: General: sitting up in hospital bed, NAD Cardiac: regular rate Respiratory: no increased work of breathing GI: abdomen soft, non-tender Right upper extremity: Dressing CDI and sling well fitting.  Axillary nerve sensation/motor altered in setting of block and unable to be fully tested.  Distal motor and sensory altered in setting of block. Warm well perfused hand.    Vitals:   09/20/20 0143 09/20/20 0517  BP: 140/61 (!) 148/67  Pulse: 90 97  Resp: 16 16  Temp: 98.5 F (36.9 C) 98.2 F (36.8 C)  SpO2: 100% 100%     ASSESSMENT: PRANAVI AURE is a 80 y.o. female doing well postoperatively. POD#1  PLAN: Weightbearing: NWB RUE Insicional and dressing care: Reinforce dressings as needed Orthopedic device(s): Sling Showering: Post-op day #2 with assistance VTE prophylaxis: Aspirin BID. SCDs. Ambulation Pain control: PRN pain medications, preferring oral medications Follow - up plan: 1 week in office Contact information: Dr. Ophelia Charter, Noemi Chapel PA-C, After hours and holidays please check [email or web address] for group call information for [facility]  Dispo: TBD. Patient has had a difficult time ambulating since her fall. She normally walks with a walker. Her husband has been at home taking care of her, but she is nervous about going home. Will discuss with TOC team - may need home health vs SNF. PT/OT evaluation pending. Will also discuss discharge plans with her husband via telephone.    Noemi Chapel, PA-C 09/20/2020

## 2020-09-20 NOTE — Evaluation (Signed)
Occupational Therapy Evaluation Patient Details Name: Doris Jackson MRN: [id number] DOB: 19-Feb-1941 Today's Date: 09/20/2020    History of Present Illness Patient is a 80 year old female admitted 3/23 for R reverse total shoulder arthroplasty due to fall on 3/17 resulting in proximal humerus fracture. PMH includes HTN, HLD, GERD, DM2, COPD, anterior cervical disectomy   Clinical Impression   Patient lives at home with spouse in a single level home with 1 step to enter from back. Patient reports her spouse is semi-retired, goes to work daily for few hours and comes back throughout the day to help walk her to the bathroom. Patient reports she also has a daughter that comes by but she is disabled therefore unable to provide much physical assistance. Patient reports she was mod I with ADLs and ambulating with a walker. Currently patient requiring increased assistance with all ADLs due to shoulder precautions/WB restrictions. With min x2 for safety patient take few steps with quad cane however was very unsteady. Transitioned to a walker and provided steering assist as patient held on with L UE with increased stability. Patient needed total A to don shoes and adjust sling prior to transfer to chair. Due to increased assist level, poor balance and activity tolerance patient would need 24/7 supervision and assist at home at discharge. If family is unable to provide this would recommend short term rehab, which patient is agreeable to.     Follow Up Recommendations  SNF;Other (comment) (vs HH and 24/7 supervision + assist)    Equipment Recommendations  3 in 1 bedside commode       Precautions / Restrictions Precautions Precautions: Fall;Shoulder Type of Shoulder Precautions: AROM at elbow, wrist, hand ok. A/PROM shoulder NO Shoulder Interventions: Shoulder sling/immobilizer;Off for dressing/bathing/exercises Precaution Booklet Issued: Yes (comment) Required Braces or Orthoses:  Sling Restrictions Weight Bearing Restrictions: Yes RUE Weight Bearing: Non weight bearing      Mobility Bed Mobility Overal bed mobility: Needs Assistance Bed Mobility: Supine to Sit     Supine to sit: Mod assist;HOB elevated     General bed mobility comments: cues for sequencing and use of bed rail. mod A for trunk support and bringing LE to EOB. Educated patient on how to set up pillows to sleep in recliner at home, verbalize understanding and reports she typically sleeps in a recliner    Transfers Overall transfer level: Needs assistance Equipment used: Rolling walker (2 wheeled);Quad cane Transfers: Sit to/from Stand Sit to Stand: [employer] safety/equipment;From elevated surface         General transfer comment: please see toilet transfer in ADL section    Balance Overall balance assessment: History of Falls;Needs assistance Sitting-balance support: Feet supported Sitting balance-Leahy Scale: Fair     Standing balance support: Single extremity supported Standing balance-Leahy Scale: Poor Standing balance comment: reliant on UE support and external assistance                           ADL either performed or assessed with clinical judgement   ADL Overall ADL's : Needs assistance/impaired     Grooming: Wash/dry face;Set up;Sitting   Upper Body Bathing: Moderate assistance;Sitting   Lower Body Bathing: Maximal assistance;Sitting/lateral leans;Sit to/from stand   Upper Body Dressing : Moderate assistance;Sitting   Lower Body Dressing: Total assistance;Sitting/lateral leans Lower Body Dressing Details (indicate cue type and reason): to don shoes Toilet Transfer: Minimal assistance;+2 for safety/equipment;Ambulation;RW (cane) Toilet Transfer Details (indicate cue type and reason):  with bed height elevated patient min A x2 for safety to power up to standing. Initial ambulation with quad cane however patient unsteady, having difficulty keeping 4  points of quad cane on the ground. Transitioned to walker with assist steering due to pt only able to use L UE with increased stability. Toileting- Clothing Manipulation and Hygiene: Maximal assistance;Sitting/lateral lean;Sit to/from stand       Functional mobility during ADLs: Minimal assistance;+2 for safety/equipment;Cueing for sequencing;Cueing for safety;Rolling walker;Cane General ADL Comments: patient requiring increased assistance with self care tasks due to pain, inability to use R UE functionally, poor balance                  Pertinent Vitals/Pain Pain Assessment: Faces Faces Pain Scale: Hurts little more Pain Location: R shoulder Pain Descriptors / Indicators: Grimacing;Guarding Pain Intervention(s): Monitored during session     Hand Dominance Left   Extremity/Trunk Assessment Upper Extremity Assessment Upper Extremity Assessment: RUE deficits/detail RUE Deficits / Details: patient able to make a fist with R hand RUE: Unable to fully assess due to pain;Unable to fully assess due to immobilization   Lower Extremity Assessment Lower Extremity Assessment: Defer to PT evaluation       Communication Communication Communication: No difficulties   Cognition Arousal/Alertness: Awake/alert Behavior During Therapy: WFL for tasks assessed/performed Overall Cognitive Status: Within Functional Limits for tasks assessed                                 General Comments: tangential   General Comments  patient sat 97-98% RA    Exercises Exercises: Shoulder   Shoulder Instructions Shoulder Instructions Donning/doffing shirt without moving shoulder: Patient able to independently direct caregiver Method for sponge bathing under operated UE: Patient able to independently direct caregiver Donning/doffing sling/immobilizer: Patient able to independently direct caregiver Correct positioning of sling/immobilizer: Patient able to independently direct  caregiver Pendulum exercises (written home exercise program):  (N/A) ROM for elbow, wrist and digits of operated UE: Patient able to independently direct caregiver Sling wearing schedule (on at all times/off for ADL's): Patient able to independently direct caregiver Proper positioning of operated UE when showering: Patient able to independently direct caregiver Positioning of UE while sleeping: Patient able to independently direct caregiver    Home Living Family/patient expects to be discharged to:: Private residence Living Arrangements: Spouse/significant other Available Help at Discharge: Family;Available PRN/intermittently Type of Home: House Home Access: Stairs to enter [employer] of Steps: 1 step at back entrance without rails; 3-4 in front Entrance Stairs-Rails: None Home Layout: One level     Bathroom Shower/Tub: Tub/shower unit;Curtain   Bathroom Toilet: Standard Bathroom Accessibility: Yes   Home Equipment: Environmental consultant - 4 wheels;Walker - 2 wheels          Prior Functioning/Environment Level of Independence: Needs assistance  Gait / Transfers Assistance Needed: pt requires assistance to get legs back onto bed and some assistance for stairs, otherwise modI with RW ADL's / Homemaking Assistance Needed: patient reports mod I with BADL            OT Problem List: Decreased range of motion;Decreased activity tolerance;Impaired balance (sitting and/or standing);Decreased safety awareness;Decreased knowledge of use of DME or AE;Decreased knowledge of precautions;Pain;Impaired UE functional use;Obesity      OT Treatment/Interventions: Self-care/ADL training;Therapeutic exercise;DME and/or AE instruction;Therapeutic activities;Patient/family education;Balance training    OT Goals(Current goals can be found in the care plan section) Acute Rehab OT  Goals Patient Stated Goal: care for myself OT Goal Formulation: With patient Time For Goal Achievement:  10/04/20 Potential to Achieve Goals: Good  OT Frequency: Min 3X/week   Barriers to D/C: Decreased caregiver support  patient reports spouse works part time and is not sure if he will stay for initial 24/7 support       Co-evaluation PT/OT/SLP Co-Evaluation/Treatment: Yes Reason for Co-Treatment: For patient/therapist safety;To address functional/ADL transfers   OT goals addressed during session: ADL's and self-care      AM-PAC OT "6 Clicks" Daily Activity     Outcome Measure Help from another person eating meals?: None Help from another person taking care of personal grooming?: A Little Help from another person toileting, which includes using toliet, bedpan, or urinal?: A Lot Help from another person bathing (including washing, rinsing, drying)?: A Lot Help from another person to put on and taking off regular upper body clothing?: A Lot Help from another person to put on and taking off regular lower body clothing?: Total 6 Click Score: 14   End of Session Equipment Utilized During Treatment: Rolling walker;Other (comment) (sling) Nurse Communication: Mobility status  Activity Tolerance: Patient tolerated treatment well Patient left: in chair;with call bell/phone within reach;with chair alarm set  OT Visit Diagnosis: Unsteadiness on feet (R26.81);History of falling (Z91.81);Pain Pain - Right/Left: Right Pain - part of body: Shoulder                Time: 3267-1245 OT Time Calculation (min): 43 min Charges:  OT General Charges $OT Visit: 1 Visit OT Evaluation $OT Eval Low Complexity: 1 Low OT Treatments $Self Care/Home Management : 8-22 mins  Delbert Phenix OT OT pager: [place] 09/20/2020, 2:10 PM

## 2020-09-20 NOTE — Plan of Care (Signed)

## 2020-09-20 NOTE — NC FL2 (Signed)
[place] LEVEL OF CARE SCREENING TOOL     IDENTIFICATION  Patient Name: Doris Jackson Birthdate: 10-Nov-1940 Sex: female Admission Date (Current Location): 09/19/2020  [facility] and [place] Number:  Herbalist and Address:  [facility],  [place], [place]      Provider Number: [id number]  Attending Physician Name and Address:  Hiram Gash, MD  Relative Name and Phone Number:  spouse, Angelea Penny @ [phone]    Current Level of Care: Hospital Recommended Level of Care: [place] Prior Approval Number:    Date Approved/Denied:   PASRR Number: [id number]  Discharge Plan: SNF    Current Diagnoses: Patient Active Problem List   Diagnosis Date Noted  . 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  . Allergic rhinitis due to animal (cat) (dog) hair and dander 07/18/2020  . Leiomyoma 07/18/2020  . 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 Anxiety state   . Acute blood loss anemia   . Postoperative pain   . Cervical myelopathy ([place]) 09/22/2019  . S/P cervical spinal fusion 09/22/2019  . 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 AKI (acute kidney injury) ([place]) 09/14/2019  . GERD (gastroesophageal reflux disease) 09/14/2019  . Spinal stenosis in cervical region 09/14/2019  . Weakness 09/14/2019  . Numbness and tingling 09/14/2019  . Cervical spinal stenosis 09/14/2019  . Status post cataract extraction and insertion of intraocular lens of left eye 09/04/2019  . Lumbar stenosis 08/24/2019  . Allergic rhinitis due to pollen 06/06/2019  . Persistent cough 06/06/2019  . History of Descemet membrane endothelial keratoplasty (DMEK) 01/04/2019  . Status post cataract extraction and insertion of intraocular lens of right eye 01/04/2019  . Senile nuclear sclerosis 01/02/2019  . 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 Anatomical narrow  angle, 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  . Obesity, morbid, BMI 40.0-49.9 ([place]) 04/19/2018  . Morbid obesity ([place]) 04/19/2018  . Cataract   . Fibroid   . Endometriosis   . DM (diabetes mellitus) type II controlled with renal manifestation ([place]) 06/17/2010  . Dyslipidemia 06/17/2010  . Microcytic anemia 06/17/2010  . Essential hypertension 06/17/2010  . Allergic rhinitis 06/17/2010  . Osteoarthritis 06/17/2010  . LOW BACK PAIN 06/17/2010  . OSTEOPENIA 06/17/2010  . BREAST CANCER, HX OF 06/17/2010  . Disorder of skeletal system 06/17/2010    Orientation RESPIRATION BLADDER Height & Weight     Self,Time,Situation,Place  Normal Continent Weight: 226 lb 2.7 oz (102.6 kg) Height:  5\' 2"  (157.5 cm)  BEHAVIORAL SYMPTOMS/MOOD NEUROLOGICAL BOWEL NUTRITION STATUS      Continent    AMBULATORY STATUS COMMUNICATION OF NEEDS Skin   Limited Assist Verbally Surgical wounds                       Personal Care Assistance Level of Assistance  Bathing,Feeding,Dressing Bathing Assistance: Limited assistance Feeding assistance: Limited assistance Dressing Assistance: Limited assistance     Functional Limitations Info             SPECIAL CARE FACTORS FREQUENCY  PT (By licensed PT),OT (By licensed OT)     PT Frequency: 5x/wk OT Frequency: 5x/wk            Contractures Contractures Info: Not present    Additional Factors Info  Code Status,Allergies Code Status Info: Full Allergies Info: see MAR           Current Medications (09/20/2020):  This  is the current hospital active medication list Current Facility-Administered Medications  Medication Dose Route Frequency Provider Last Rate Last Admin  . acetaminophen (TYLENOL) tablet 1,000 mg  1,000 mg Oral Q8H McBane, Caroline N, PA-C   1,000 mg at 09/20/20 3151  . benazepril (LOTENSIN) tablet 40 mg  40 mg Oral Daily Ethelda Chick, PA-C   40 mg at 09/20/20 7616  . bisacodyl (DULCOLAX) suppository 10 mg  10 mg Rectal Daily PRN McBane,  Maylene Roes, PA-C      . diphenhydrAMINE (BENADRYL) 12.5 MG/5ML elixir 12.5-25 mg  12.5-25 mg Oral Q4H PRN McBane, Maylene Roes, PA-C      . docusate sodium (COLACE) capsule 100 mg  100 mg Oral BID Ethelda Chick, PA-C   100 mg at 09/19/20 2148  . famotidine (PEPCID) tablet 20 mg  20 mg Oral QHS Ethelda Chick, PA-C   20 mg at 09/19/20 2148  . fluticasone (FLONASE) 50 MCG/ACT nasal spray 1 spray  1 spray Each Nare Daily McBane, Caroline N, PA-C      . glipiZIDE (GLUCOTROL XL) 24 hr tablet 5 mg  5 mg Oral Q breakfast McBane, Maylene Roes, PA-C   5 mg at 09/20/20 0737  . hydrochlorothiazide (HYDRODIURIL) tablet 12.5 mg  12.5 mg Oral Daily Ethelda Chick, PA-C   12.5 mg at 09/20/20 1062  . HYDROmorphone (DILAUDID) injection 0.5-1 mg  0.5-1 mg Intravenous Q4H PRN McBane, Maylene Roes, PA-C      . insulin aspart (novoLOG) injection 0-20 Units  0-20 Units Subcutaneous TID WC Ethelda Chick, PA-C   7 Units at 09/20/20 1228  . insulin aspart (novoLOG) injection 0-5 Units  0-5 Units Subcutaneous QHS Ethelda Chick, PA-C   3 Units at 09/19/20 2149  . linagliptin (TRADJENTA) tablet 5 mg  5 mg Oral Daily McBane, Caroline N, PA-C      . magnesium citrate solution 1 Bottle  1 Bottle Oral Once PRN McBane, Maylene Roes, PA-C      . menthol-cetylpyridinium (CEPACOL) lozenge 3 mg  1 lozenge Oral PRN McBane, Maylene Roes, PA-C       Or  . phenol (CHLORASEPTIC) mouth spray 1 spray  1 spray Mouth/Throat PRN McBane, Maylene Roes, PA-C      . metFORMIN (GLUCOPHAGE-XR) 24 hr tablet 500 mg  500 mg Oral BID WC Ophelia Charter T, MD   500 mg at 09/20/20 0830  . methocarbamol (ROBAXIN) tablet 500 mg  500 mg Oral Q6H PRN Ethelda Chick, PA-C   500 mg at 09/20/20 0830   Or  . methocarbamol (ROBAXIN) 500 mg in dextrose 5 % 50 mL IVPB  500 mg Intravenous Q6H PRN McBane, Maylene Roes, PA-C      . metoCLOPramide (REGLAN) tablet 5-10 mg  5-10 mg Oral Q8H PRN McBane, Maylene Roes, PA-C       Or  . metoCLOPramide (REGLAN)  injection 5-10 mg  5-10 mg Intravenous Q8H PRN McBane, Maylene Roes, PA-C      . metoprolol succinate (TOPROL-XL) 24 hr tablet 50 mg  50 mg Oral Daily McBane, Caroline N, PA-C   50 mg at 09/20/20 0830  . mometasone-formoterol (DULERA) 200-5 MCG/ACT inhaler 2 puff  2 puff Inhalation BID McBane, Caroline N, PA-C      . ondansetron (ZOFRAN) tablet 4 mg  4 mg Oral Q6H PRN McBane, Caroline N, PA-C       Or  . ondansetron (ZOFRAN) injection 4 mg  4 mg Intravenous Q6H PRN Noemi Chapel  N, PA-C      . oxyCODONE (Oxy IR/ROXICODONE) immediate release tablet 10 mg  10 mg Oral Q4H PRN Ethelda Chick, PA-C   10 mg at 09/20/20 6269  . oxyCODONE (Oxy IR/ROXICODONE) immediate release tablet 5 mg  5 mg Oral Q4H PRN Ethelda Chick, PA-C   5 mg at 09/20/20 0350  . pioglitazone (ACTOS) tablet 45 mg  45 mg Oral Daily Ophelia Charter T, MD   45 mg at 09/20/20 1239  . polyethylene glycol (MIRALAX / GLYCOLAX) packet 17 g  17 g Oral Daily PRN McBane, Maylene Roes, PA-C      . pravastatin (PRAVACHOL) tablet 40 mg  40 mg Oral Daily Ethelda Chick, PA-C   40 mg at 09/20/20 4854  . spironolactone (ALDACTONE) tablet 12.5 mg  12.5 mg Oral Daily Ethelda Chick, PA-C   12.5 mg at 09/20/20 6270     Discharge Medications: Please see discharge summary for a list of discharge medications.  Relevant Imaging Results:  Relevant Lab Results:   Additional Information SS# [id number]  Lennart Pall, LCSW

## 2020-09-20 NOTE — TOC Initial Note (Addendum)
Transition of Care [facility]) - Initial/Assessment Note    Patient Details  Name: Doris Jackson MRN: [id number] Date of Birth: Jan 27, 1941  Transition of [facility]) CM/SW Contact:    Lennart Pall, LCSW Phone Number: 09/20/2020, 11:41 AM  Clinical Narrative:                 Met with pt this morning to introduce self/ role.  Pt states, "you finally came by".  Pt reports that she would like assistance with "finding somebody who can help me with getting a bath, fixing something to eat and cleaning up the house a little."  Reviewed with her that I can assist with arranging skilled services as MD recommends but that an aide in the home would need to be arranged by her/ her family.  Pt reports that she has her spouse and adult daughter in the home and both working part time.  Describes the assistance that her spouse has been providing at home prior to this admission.  Unclear at this point how limited pt is post surgery and will await tx evals and recommendations.  TOC will continue to follow.  ADDENDUM (2:09): Have spoken with PT who has worked with pt today and is recommending short term SNF rehab.  Discussed with pt and spouse and both are agreeable "as long as it's short term."  Will send pt's information out to area SNFs who are in network with insurance and review bed offers with pt/ spouse once received.  Expected Discharge Plan: [place] Barriers to Discharge: Continued Medical Work up   Patient Goals and CMS Choice Patient states their goals for this hospitalization and ongoing recovery are:: to go home with "somebody to help me"      Expected Discharge Plan and Services Expected Discharge Plan: [place] In-house Referral: Clinical Social Work     Living arrangements for the past 2 months: Single Family Home                                      Prior Living Arrangements/Services Living arrangements for the past 2 months: Single Family  Home Lives with:: Doris Jackson Patient language and need for interpreter reviewed:: No Do you feel safe going back to the place where you live?: Yes      Need for Family Participation in Patient Care: Yes (Comment) Care giver support system in place?: Yes (comment)   Criminal Activity/Legal Involvement Pertinent to Current Situation/Hospitalization: No - Comment as needed  Activities of Daily Living Home Assistive Devices/Equipment: Eyeglasses,Dentures (specify type),Cane (specify quad or straight),CBG Meter,Shower chair with back,Walker (specify type) ADL Screening (condition at time of admission) Patient's cognitive ability adequate to safely complete daily activities?: Yes Is the patient deaf or have difficulty hearing?: No Does the patient have difficulty seeing, even when wearing glasses/contacts?: No Does the patient have difficulty concentrating, remembering, or making decisions?: No Patient able to express need for assistance with ADLs?: Yes Does the patient have difficulty dressing or bathing?: No Independently performs ADLs?: Yes (appropriate for developmental age) Does the patient have difficulty walking or climbing stairs?: Yes Weakness of Legs: Both Weakness of Arms/Hands: Right  Permission Sought/Granted Permission sought to share information with : Family Supports Permission granted to share information with : Yes, Verbal Permission Granted  Share Information with NAME: Lovetta Condie     Permission granted to share info  w Relationship: spouse  Permission granted to share info w Contact Information: [phone]  Emotional Assessment Appearance:: Appears stated age Attitude/Demeanor/Rapport: Engaged Affect (typically observed): Accepting Orientation: : Oriented to Self,Oriented to Place,Oriented to  Time,Oriented to Situation Alcohol / Substance Use: Not Applicable Psych Involvement: No (comment)  Admission diagnosis:  Status post reverse total  arthroplasty of right shoulder [Z96.611] Patient Active Problem List   Diagnosis Date Noted  . 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  . Allergic rhinitis due to animal (cat) (dog) hair and dander 07/18/2020  . Leiomyoma 07/18/2020  . 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 Anxiety state   . Acute blood loss anemia   . Postoperative pain   . Cervical myelopathy ([place]) 09/22/2019  . S/P cervical spinal fusion 09/22/2019  . 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 AKI (acute kidney injury) ([place]) 09/14/2019  . GERD (gastroesophageal reflux disease) 09/14/2019  . Spinal stenosis in cervical region 09/14/2019  . Weakness 09/14/2019  . Numbness and tingling 09/14/2019  . Cervical spinal stenosis 09/14/2019  . Status post cataract extraction and insertion of intraocular lens of left eye 09/04/2019  . Lumbar stenosis 08/24/2019  . Allergic rhinitis due to pollen 06/06/2019  . Persistent cough 06/06/2019  . History of Descemet membrane endothelial keratoplasty (DMEK) 01/04/2019  . Status post cataract extraction and insertion of intraocular lens of right eye 01/04/2019  . Senile nuclear sclerosis 01/02/2019  . 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 Obesity, morbid, BMI 40.0-49.9 ([place]) 04/19/2018  . Morbid obesity ([place]) 04/19/2018  . Cataract   . Fibroid   . Endometriosis   . DM (diabetes mellitus) type II controlled with renal manifestation ([place]) 06/17/2010  . Dyslipidemia 06/17/2010  . Microcytic anemia 06/17/2010  . Essential hypertension 06/17/2010  . Allergic rhinitis 06/17/2010  . Osteoarthritis 06/17/2010  . LOW BACK PAIN 06/17/2010  . OSTEOPENIA 06/17/2010  . BREAST CANCER, HX OF 06/17/2010  . Disorder of skeletal system 06/17/2010   PCP:  Caren Macadam, MD Pharmacy:   [id number] [place] [place]Phone: [phone]: [phone]- [place]Phone: [phone]: [phone] [id number] [place] [place]one: [phone]: [phone]   Social Determinants of Health (SDOH) Interventions    Readmission Risk Interventions Readmission Risk Prevention Plan 09/16/2019  Transportation Screening Complete  PCP or Specialist Appt within 3-5 Days Complete  HRI or Home Care Consult Complete  Social Work Consult for [place]/Counseling Complete  Palliative Care Screening Not Applicable  Medication Review (RPress photographerComplete  Some recent data might be hidden

## 2020-09-20 NOTE — Evaluation (Signed)
Physical Therapy Evaluation Patient Details Name: Doris Jackson MRN: [id number] DOB: April 28, 1941 Today's Date: 09/20/2020   History of Present Illness  Patient is a 80 year old female admitted 3/23 for R reverse total shoulder arthroplasty due to fall on 3/17 resulting in proximal humerus fracture. PMH includes HTN, HLD, GERD, DM2, COPD, anterior cervical disectomy  Clinical Impression  Patient lives at home with spouse in a single level home with 1 step to enter from back. Patient reports her spouse is semi-retired, goes to work daily for few hours and comes back throughout the day to help walk her to the bathroom. Patient reports she also has a daughter that comes by but she is disabled therefore unable to provide much physical assistance. Patient reports she was mod I with ADLs and ambulating with a walker. Currently patient requiring increased assistance with all ADLs due to shoulder precautions/WB restrictions. With min x2 for safety patient take few steps with quad cane however was very unsteady. Transitioned to a walker and provided steering assist as patient held on with L UE with increased stability. Patient needed total A to don shoes and adjust sling prior to transfer to chair. Due to increased assist level, poor balance and activity tolerance patient would need 24/7 assist at home at discharge. If family is unable to provide this would recommend short term rehab, which patient is agreeable to.     Follow Up Recommendations SNF ([facility] HHPT and 24/7 assist at home)    Equipment Recommendations  None recommended by PT    Recommendations for Other Services       Precautions / Restrictions Precautions Precautions: Fall;Shoulder Type of Shoulder Precautions: AROM at elbow, wrist, hand ok. A/PROM shoulder NO Shoulder Interventions: Shoulder sling/immobilizer;Off for dressing/bathing/exercises Precaution Booklet Issued: Yes (comment) Required Braces or Orthoses: Sling Restrictions Weight  Bearing Restrictions: Yes RUE Weight Bearing: Non weight bearing      Mobility  Bed Mobility Overal bed mobility: Needs Assistance Bed Mobility: Supine to Sit     Supine to sit: Mod assist;HOB elevated     General bed mobility comments: cues for sequencing and use of bed rail. mod A for trunk support and bringing LE to EOB. Educated patient on how to set up pillows to sleep in recliner at home, verbalize understanding and reports she typically sleeps in a recliner    Transfers Overall transfer level: Needs assistance Equipment used: Rolling walker (2 wheeled);Quad cane Transfers: Sit to/from Stand Sit to Stand: [employer] safety/equipment;From elevated surface         General transfer comment: cues for use of L UE to self assist;  Physical assist to bring wt up and fwd and to balance in initial standing  Ambulation/Gait Ambulation/Gait assistance: Min assist Gait Distance (Feet): 12 Feet Assistive device: Rolling walker (2 wheeled);Quad cane Gait Pattern/deviations: Step-to pattern;Decreased step length - right;Decreased step length - left;Shuffle;Trunk flexed Gait velocity: decr   General Gait Details: Pt ambulated 3' with QC but very unstable and at high risk for LOB and falling.  Pt ambulated additional 9' with one hand on RW and assist to manage RW - marked improvement in stability vs use of QC.  Distance ltd by fatigue  Stairs            Wheelchair Mobility    Modified Rankin (Stroke Patients Only)       Balance Overall balance assessment: History of Falls;Needs assistance Sitting-balance support: Feet supported Sitting balance-Leahy Scale: Fair     Standing balance  support: Single extremity supported Standing balance-Leahy Scale: Poor Standing balance comment: reliant on UE support and external assistance                             Pertinent Vitals/Pain Pain Assessment: Faces Faces Pain Scale: Hurts little more Pain Location: R  shoulder Pain Descriptors / Indicators: Grimacing;Guarding Pain Intervention(s): Limited activity within patient's tolerance;Monitored during session    Home Living Family/patient expects to be discharged to:: Private residence Living Arrangements: Spouse/significant other Available Help at Discharge: Family;Available PRN/intermittently Type of Home: House Home Access: Stairs to enter Entrance Stairs-Rails: None Entrance Stairs-Number of Steps: 1 step at back entrance without rails; 3-4 in front Home Layout: One level Home Equipment: Walker - 4 wheels;Walker - 2 wheels;Cane - quad      Prior Function Level of Independence: Needs assistance   Gait / Transfers Assistance Needed: pt requires assistance to get legs back onto bed and some assistance for stairs, otherwise modI with RW  ADL's / Homemaking Assistance Needed: patient reports mod I with BADL  Comments[facility] rather than RW when she fell     Hand Dominance   Dominant Hand: Left    Extremity/Trunk Assessment   Upper Extremity Assessment Upper Extremity Assessment: RUE deficits/detail RUE Deficits / Details: patient able to make a fist with R hand RUE: Unable to fully assess due to immobilization    Lower Extremity Assessment Lower Extremity Assessment: Generalized weakness       Communication   Communication: No difficulties  Cognition Arousal/Alertness: Awake/alert Behavior During Therapy: WFL for tasks assessed/performed Overall Cognitive Status: Within Functional Limits for tasks assessed                                 General Comments: tangential      General Comments General comments (skin integrity, edema, etc.): patient sat 97-98% RA    Exercises Donning/doffing shirt without moving shoulder: Patient able to independently direct caregiver Method for sponge bathing under operated UE: Patient able to independently direct caregiver Donning/doffing sling/immobilizer:  Patient able to independently direct caregiver Correct positioning of sling/immobilizer: Patient able to independently direct caregiver Pendulum exercises (written home exercise program):  (N/A) ROM for elbow, wrist and digits of operated UE: Patient able to independently direct caregiver Sling wearing schedule (on at all times/off for ADL's): Patient able to independently direct caregiver Proper positioning of operated UE when showering: Patient able to independently direct caregiver Positioning of UE while sleeping: Patient able to independently direct caregiver   Assessment/Plan    PT Assessment Patient needs continued PT services  PT Problem List Decreased strength;Decreased activity tolerance;Decreased balance;Decreased mobility;Decreased knowledge of use of DME;Decreased safety awareness;Obesity;Pain       PT Treatment Interventions DME instruction;Gait training;Stair training;Functional mobility training;Therapeutic activities;Therapeutic exercise;Patient/family education;Cognitive remediation;Balance training    PT Goals (Current goals can be found in the Care Plan section)  Acute Rehab PT Goals Patient Stated Goal: care for myself PT Goal Formulation: With patient Time For Goal Achievement: 09/27/20 Potential to Achieve Goals: Fair    Frequency Min 6X/week   Barriers to discharge Decreased caregiver support Pt's spouse is 25yo and works out of the home    Co-evaluation PT/OT/SLP Co-Evaluation/Treatment: Yes Reason for Co-Treatment: For patient/therapist safety PT goals addressed during session: Mobility/safety with mobility OT goals addressed during session: ADL's and self-care  AM-PAC PT "6 Clicks" Mobility  Outcome Measure Help needed turning from your back to your side while in a flat bed without using bedrails?: A Lot Help needed moving from lying on your back to sitting on the side of a flat bed without using bedrails?: A Lot Help needed moving to and from  a bed to a chair (including a wheelchair)?: A Lot Help needed standing up from a chair using your arms (e.g., wheelchair or bedside chair)?: A Lot Help needed to walk in hospital room?: A Lot Help needed climbing 3-5 steps with a railing? : A Lot 6 Click Score: 12    End of Session Equipment Utilized During Treatment: Gait belt;Other (comment) (R arm sling) Activity Tolerance: Patient limited by fatigue Patient left: in chair;with call bell/phone within reach;with chair alarm set Nurse Communication: Mobility status PT Visit Diagnosis: Unsteadiness on feet (R26.81);Muscle weakness (generalized) (M62.81);History of falling (Z91.81);Difficulty in walking, not elsewhere classified (R26.2)    Time: 2119-4174 PT Time Calculation (min) (ACUTE ONLY): 24 min   Charges:   PT Evaluation $PT Eval Low Complexity: 1 Low          [place] Pager [phone] Office [phone]   Haidyn Kilburg 09/20/2020, 3:12 PM

## 2020-09-21 DIAGNOSIS — S42201A Unspecified fracture of upper end of right humerus, initial encounter for closed fracture: Secondary | ICD-10-CM | POA: Diagnosis not present

## 2020-09-21 DIAGNOSIS — J449 Chronic obstructive pulmonary disease, unspecified: Secondary | ICD-10-CM | POA: Diagnosis not present

## 2020-09-21 DIAGNOSIS — Z7984 Long term (current) use of oral hypoglycemic drugs: Secondary | ICD-10-CM | POA: Diagnosis not present

## 2020-09-21 DIAGNOSIS — I10 Essential (primary) hypertension: Secondary | ICD-10-CM | POA: Diagnosis not present

## 2020-09-21 DIAGNOSIS — Z20822 Contact with and (suspected) exposure to covid-19: Secondary | ICD-10-CM | POA: Diagnosis not present

## 2020-09-21 DIAGNOSIS — E119 Type 2 diabetes mellitus without complications: Secondary | ICD-10-CM | POA: Diagnosis not present

## 2020-09-21 DIAGNOSIS — W1830XA Fall on same level, unspecified, initial encounter: Secondary | ICD-10-CM | POA: Diagnosis not present

## 2020-09-21 DIAGNOSIS — Z79899 Other long term (current) drug therapy: Secondary | ICD-10-CM | POA: Diagnosis not present

## 2020-09-21 DIAGNOSIS — S42231A 3-part fracture of surgical neck of right humerus, initial encounter for closed fracture: Secondary | ICD-10-CM | POA: Diagnosis not present

## 2020-09-21 DIAGNOSIS — Z853 Personal history of malignant neoplasm of breast: Secondary | ICD-10-CM | POA: Diagnosis not present

## 2020-09-21 LAB — GLUCOSE, CAPILLARY
Glucose-Capillary: 144 mg/dL — ABNORMAL HIGH (ref 70–99)
Glucose-Capillary: 188 mg/dL — ABNORMAL HIGH (ref 70–99)
Glucose-Capillary: 163 mg/dL — ABNORMAL HIGH (ref 70–99)
Glucose-Capillary: 218 mg/dL — ABNORMAL HIGH (ref 70–99)

## 2020-09-21 MED ORDER — SODIUM CHLORIDE 0.9 % IV BOLUS
250.0000 mL | Freq: Once | INTRAVENOUS | Status: AC
  Administered 2020-09-21: 250 mL via INTRAVENOUS

## 2020-09-21 MED ORDER — PREDNISOLONE ACETATE 1 % OP SUSP
1.0000 [drp] | Freq: Three times a day (TID) | OPHTHALMIC | Status: DC
  Administered 2020-09-21 – 2020-09-27 (×19): 1 [drp] via OPHTHALMIC
  Filled 2020-09-21: qty 5

## 2020-09-21 MED ORDER — ACETAMINOPHEN 500 MG PO TABS: 1000.0000 mg | ORAL_TABLET | Freq: Three times a day (TID) | ORAL | 0 refills | Status: DC

## 2020-09-21 MED ORDER — PREDNISOLONE ACETATE 1 % OP SUSP: 1.0000 [drp] | OPHTHALMIC | Status: DC

## 2020-09-21 MED ORDER — HYDROMORPHONE HCL 1 MG/ML IJ SOLN: 0.2500 mg | Freq: Four times a day (QID) | INTRAMUSCULAR | Status: DC | PRN

## 2020-09-21 MED ORDER — OXYCODONE HCL 5 MG PO TABS: ORAL_TABLET | ORAL | 0 refills | Status: DC

## 2020-09-21 MED ORDER — RIVAROXABAN 10 MG PO TABS: 10.0000 mg | ORAL_TABLET | Freq: Every day | ORAL | 0 refills | Status: DC

## 2020-09-21 MED ORDER — PREDNISOLONE ACETATE 1 % OP SUSP
1.0000 [drp] | Freq: Every day | OPHTHALMIC | Status: DC
  Administered 2020-09-21 – 2020-09-27 (×7): 1 [drp] via OPHTHALMIC

## 2020-09-21 MED ORDER — RIVAROXABAN 10 MG PO TABS
10.0000 mg | ORAL_TABLET | Freq: Every day | ORAL | Status: DC
  Administered 2020-09-21 – 2020-09-27 (×7): 10 mg via ORAL
  Filled 2020-09-21 (×7): qty 1

## 2020-09-21 MED ORDER — LACTATED RINGERS IV BOLUS
500.0000 mL | Freq: Once | INTRAVENOUS | Status: AC
  Administered 2020-09-21: 500 mL via INTRAVENOUS

## 2020-09-21 NOTE — Progress Notes (Signed)
Physical Therapy Treatment Patient Details Name: Doris Jackson MRN: [id number] DOB: July 19, 1940 Today's Date: 09/21/2020    History of Present Illness Patient is a 80 year old female admitted 3/23 for R reverse total shoulder arthroplasty due to fall on 3/17 resulting in proximal humerus fracture. PMH includes HTN, HLD, GERD, DM2, COPD, anterior cervical disectomy    PT Comments    Pt requires occasional redirection to task, is anxious/ fearful regarding falls but agreeable to PT. Pt overall improved requiring decr assist with bed mobility and able to incr gait distance (in room distance, pt declined attempt to amb in hallway today). Pt will benefit from SNF post acute to maximize independence and prevent falls.   Follow Up Recommendations  SNF     Equipment Recommendations  None recommended by PT    Recommendations for Other Services       Precautions / Restrictions Precautions Precautions: Fall;Shoulder Type of Shoulder Precautions: AROM at elbow, wrist, hand ok. A/PROM shoulder NO Shoulder Interventions: Shoulder sling/immobilizer;Off for dressing/bathing/exercises Required Braces or Orthoses: Sling Restrictions RUE Weight Bearing: Non weight bearing    Mobility  Bed Mobility Overal bed mobility: Needs Assistance Bed Mobility: Supine to Sit     Supine to sit: HOB elevated;Min assist     General bed mobility comments: cues for sequencing and use of bed rail. min assist to elevate trunk. (sleeps in recliner at home)    Transfers Overall transfer level: Needs assistance Equipment used: Rolling walker (2 wheeled) Transfers: Sit to/from Stand Sit to Stand: Min assist         General transfer comment: cues for use of L UE to self assist;  Physical assist to bring wt up and fwd and to balance in initial standing  Ambulation/Gait Ambulation/Gait assistance: Min assist Gait Distance (Feet): 20 Feet Assistive device: Rolling walker (2 wheeled) Gait  Pattern/deviations: Step-through pattern;Decreased stride length;Wide base of support Gait velocity: decr   General Gait Details: Pt ambulated with one hand on RW and light assist from PT to manage RW (unstable with attempted use of QC on previous session).  Distance ltd by fatigue, pt fearful of falling   Stairs             Wheelchair Mobility    Modified Rankin (Stroke Patients Only)       Balance Overall balance assessment: History of Falls;Needs assistance Sitting-balance support: Feet supported Sitting balance-Leahy Scale: Fair     Standing balance support: Single extremity supported Standing balance-Leahy Scale: Poor Standing balance comment: reliant on UE support for static stand                            Cognition Arousal/Alertness: Awake/alert Behavior During Therapy: WFL for tasks assessed/performed Overall Cognitive Status: Within Functional Limits for tasks assessed                                 General Comments: occasional redirection to task      Exercises      General Comments        Pertinent Vitals/Pain Pain Assessment: Faces Pain Location: R shoulder Pain Descriptors / Indicators: Grimacing;Guarding Pain Intervention(s): Limited activity within patient's tolerance;Monitored during session;Repositioned;Premedicated before session;Patient requesting pain meds-RN notified;Ice applied;RN gave pain meds during session    Home Living  Prior Function            PT Goals (current goals can now be found in the care plan section) Acute Rehab PT Goals Patient Stated Goal: care for myself PT Goal Formulation: With patient Time For Goal Achievement: 09/27/20 Potential to Achieve Goals: Fair Progress towards PT goals: Progressing toward goals    Frequency    Min 4X/week (SNF)      PT Plan Current plan remains appropriate    Co-evaluation              AM-PAC PT "6 Clicks"  Mobility   Outcome Measure  Help needed turning from your back to your side while in a flat bed without using bedrails?: A Lot Help needed moving from lying on your back to sitting on the side of a flat bed without using bedrails?: A Lot Help needed moving to and from a bed to a chair (including a wheelchair)?: A Lot Help needed standing up from a chair using your arms (e.g., wheelchair or bedside chair)?: A Little Help needed to walk in hospital room?: A Little Help needed climbing 3-5 steps with a railing? : A Lot 6 Click Score: 14    End of Session Equipment Utilized During Treatment: Gait belt;Other (comment) (R UE sling) Activity Tolerance: Patient tolerated treatment well Patient left: in chair;with call bell/phone within reach;with chair alarm set;with nursing/sitter in room Nurse Communication: Mobility status PT Visit Diagnosis: Unsteadiness on feet (R26.81);Muscle weakness (generalized) (M62.81);History of falling (Z91.81);Difficulty in walking, not elsewhere classified (R26.2)     Time: 7290-2111 PT Time Calculation (min) (ACUTE ONLY): 24 min  Charges:  $Gait Training: 8-22 mins $Therapeutic Activity: 8-22 mins                     Baxter Flattery, PT  Acute Rehab Dept ([place]) [phone] Pager [phone]  09/21/2020    [facility] 09/21/2020, 11:27 AM

## 2020-09-21 NOTE — Progress Notes (Signed)
   ORTHOPAEDIC PROGRESS NOTE  s/p Procedure(s): REVERSE SHOULDER ARTHROPLASTY for fracture on 09/19/2020 with Dr. Griffin Basil  SUBJECTIVE: Patient denies any pain in the right arm. Her blood pressure was low overnight, but she is not having any symptoms. She thinks it is related to medications. Using purewick and voiding well. No abdominal pain. Tolerating diet. No chest pain. No SOB. No nausea/vomiting. No other complaints.  OBJECTIVE: PE: General: sitting up in hospital bed, NAD Cardiac: regular rate Respiratory: no increased work of breathing GI: abdomen soft, non-tender Right upper extremity: Dressing CDI and sling well fitting. + Motor in  AIN, PIN, Ulnar distributions. Axillary nerve sensation preserved and symmetric.  Sensation intact in medial, radial, and ulnar distributions. Well perfused digits.      Vitals:   09/21/20 0256 09/21/20 0603  BP: (!) 127/55 (!) 124/52  Pulse: 96 93  Resp:  16  Temp:  97.9 F (36.6 C)  SpO2:  100%     ASSESSMENT: Doris Jackson is a 80 y.o. female doing well postoperatively. POD#2  PLAN: Weightbearing: NWB RUE Insicional and dressing care: Reinforce dressings as needed Orthopedic device(s): Sling Showering: Post-op day #2 with assistance VTE prophylaxis: Xarelto. SCDs. Ambulation Pain control: PRN pain medications, preferring oral medications Follow - up plan: 1 week in office Contact information: Dr. Ophelia Charter, Noemi Chapel PA-C, After hours and holidays please check [email or web address] for group call information for [facility]  Dispo: SNF.  PT/OT recommending SNF. Patient and her husband agreeable to this. TOC following. Okay to discharge to SNF once bed is available.   Patient's BP was low overnight. Improving after bolus. Patient is asymptomatic. Will continue to monitor.    Noemi Chapel, PA-C 09/21/2020

## 2020-09-21 NOTE — Progress Notes (Signed)
Occupational Therapy Treatment Patient Details Name: Doris Jackson MRN: [id number] DOB: 05-26-1941 Today's Date: 09/21/2020    History of present illness Patient is a 80 year old female admitted 3/23 for R reverse total shoulder arthroplasty due to fall on 3/17 resulting in proximal humerus fracture. PMH includes HTN, HLD, GERD, DM2, COPD, anterior cervical disectomy   OT comments  Treatment focused on reiteration of shoulder precautions and education as well as AROM of elbow, wrist and hand. Patient did not have detailed carryover of previous education and therapist reiterated shoulder precautions, donning/doffing sling, sling position, arm positioning, allowed to dangle for washing but otherwise no AROM/PROM. Patient verbalized understanding. Able to perform hand, wrist, forearm exercises with some guarding but needed gentle active assist to perform elbow flexion/extension. Therapist assisted with repositioning of sling and arm and patient reports improved position. Will continue to follow acutely and hopefully progress to ADLs. Continue to recommend short term rehab.   Follow Up Recommendations  SNF;Other (comment) (24/7 assist)    Equipment Recommendations  3 in 1 bedside commode (wide BSC)    Recommendations for Other Services      Precautions / Restrictions Precautions Precautions: Fall;Shoulder Type of Shoulder Precautions: AROM at elbow, wrist, hand ok. A/PROM shoulder NO Shoulder Interventions: Shoulder sling/immobilizer;Off for dressing/bathing/exercises Precaution Booklet Issued: Yes (comment) Required Braces or Orthoses: Sling Restrictions Weight Bearing Restrictions: Yes RUE Weight Bearing: Non weight bearing       Mobility Bed Mobility Overal bed mobility: Needs Assistance Bed Mobility: Supine to Sit     Supine to sit: HOB elevated;Min assist     General bed mobility comments: cues for sequencing and use of bed rail. min assist to elevate trunk. (sleeps in  recliner at home)    Transfers Overall transfer level: Needs assistance Equipment used: Rolling walker (2 wheeled) Transfers: Sit to/from Stand Sit to Stand: Min assist         General transfer comment: cues for use of L UE to self assist;  Physical assist to bring wt up and fwd and to balance in initial standing    Balance Overall balance assessment: History of Falls;Needs assistance Sitting-balance support: Feet supported Sitting balance-Leahy Scale: Fair     Standing balance support: Single extremity supported Standing balance-Leahy Scale: Poor Standing balance comment: reliant on UE support for static stand                           ADL either performed or assessed with clinical judgement   ADL                                               Vision Baseline Vision/History: Wears glasses Wears Glasses: Reading only     Perception     Praxis      Cognition Arousal/Alertness: Awake/alert Behavior During Therapy: WFL for tasks assessed/performed Overall Cognitive Status: Within Functional Limits for tasks assessed                                 General Comments: occasional redirection to task        Exercises     Shoulder Instructions Shoulder Instructions Method for sponge bathing under operated UE: Patient able to independently direct caregiver Correct positioning of sling/immobilizer: Patient able to  independently direct caregiver ROM for elbow, wrist and digits of operated UE: Supervision/safety Sling wearing schedule (on at all times/off for ADL's): Patient able to independently direct caregiver Positioning of UE while sleeping: Patient able to independently direct caregiver     General Comments      Pertinent Vitals/ Pain       Pain Assessment: Faces Faces Pain Scale: Hurts little more Pain Location: R shoulder Pain Descriptors / Indicators: Grimacing;Guarding Pain Intervention(s): Limited activity  within patient's tolerance  Home Living                                          Prior Functioning/Environment              Frequency  Min 1X/week        Progress Toward Goals  OT Goals(current goals can now be found in the care plan section)  Progress towards OT goals: Progressing toward goals  Acute Rehab OT Goals Patient Stated Goal: care for myself OT Goal Formulation: With patient Time For Goal Achievement: 10/04/20 Potential to Achieve Goals: Good  Plan Discharge plan remains appropriate    Co-evaluation          OT goals addressed during session: Strengthening/ROM      AM-PAC OT "6 Clicks" Daily Activity     Outcome Measure   Help from another person eating meals?: None Help from another person taking care of personal grooming?: A Little Help from another person toileting, which includes using toliet, bedpan, or urinal?: A Lot Help from another person bathing (including washing, rinsing, drying)?: A Lot Help from another person to put on and taking off regular upper body clothing?: A Lot Help from another person to put on and taking off regular lower body clothing?: Total 6 Click Score: 14    End of Session    OT Visit Diagnosis: Unsteadiness on feet (R26.81);History of falling (Z91.81);Pain Pain - Right/Left: Right Pain - part of body: Shoulder   Activity Tolerance Patient tolerated treatment well   Patient Left in chair;with call bell/phone within reach;with chair alarm set   Nurse Communication  (okay to see)        Time: 4037-0964 OT Time Calculation (min): 22 min  Charges: OT General Charges $OT Visit: 1 Visit OT Treatments $Therapeutic Exercise: 8-22 mins  Derl Barrow, OTR/L [place]  Office [phone] Pager: [place] 09/21/2020, 12:48 PM

## 2020-09-21 NOTE — TOC Progression Note (Signed)
Transition of Care [facility]) - Progression Note    Patient Details  Name: Doris Jackson MRN: [id number] Date of Birth: 06-08-1941  [facility] of [facility]) CM/SW Contact  Lennart Pall, LCSW Phone Number: 09/21/2020, 1:46 PM  Clinical Narrative:    Have reviewed SNF bed offers with patient and spouse and they have accepted bed at [facility].  Facility alerted and they will begin the insurance authorization.   Expected Discharge Plan: Skilled Nursing Facility Barriers to Discharge: Insurance Authorization  Expected Discharge Plan and Services Expected Discharge Plan: [place] In-house Referral: Clinical Social Work     Living arrangements for the past 2 months: Single Family Home                                       Social Determinants of Health (SDOH) Interventions    Readmission Risk Interventions Readmission Risk Prevention Plan 09/16/2019  Transportation Screening Complete  PCP or Specialist Appt within 3-5 Days Complete  HRI or [place] Complete  Social Work Consult for [place] Planning/Counseling Complete  Palliative Care Screening Not Applicable  Medication Review [employer]) Complete  Some recent data might be hidden

## 2020-09-21 NOTE — Plan of Care (Signed)

## 2020-09-21 NOTE — Progress Notes (Addendum)
Patient BP noted to be soft at 90/63 at 2205. When this nurse reassessed, it was 98/46. Robaxin was administered at 2137. Discussed with charge RN Mortimer Fries and reviewed orders.  Patient does not feel weak or dizzy and is not symptomatic.    "Orthopedic Hypotension Protocol Documentation* was reviewed in sidebar and initiated  -a 268mL bolus was administered at 2251. The patient also requested a drink and stated it has helped in the past with her BP.   BP was reassessed at 2355: 99/53.   3/25 - 6734 - 110/52   3/25 - 0100 - Called ortho office to notify on call PA Virgel Bouquet of patient's hypotension per 'notify physician' order specifications.   3/25 - 0118 - Orders received to discontinue Robaxin and administer another 265mL bolus and reassess.

## 2020-09-22 DIAGNOSIS — S42201A Unspecified fracture of upper end of right humerus, initial encounter for closed fracture: Secondary | ICD-10-CM | POA: Diagnosis not present

## 2020-09-22 DIAGNOSIS — Z853 Personal history of malignant neoplasm of breast: Secondary | ICD-10-CM | POA: Diagnosis not present

## 2020-09-22 DIAGNOSIS — S42231A 3-part fracture of surgical neck of right humerus, initial encounter for closed fracture: Secondary | ICD-10-CM | POA: Diagnosis not present

## 2020-09-22 DIAGNOSIS — I10 Essential (primary) hypertension: Secondary | ICD-10-CM | POA: Diagnosis not present

## 2020-09-22 DIAGNOSIS — W1830XA Fall on same level, unspecified, initial encounter: Secondary | ICD-10-CM | POA: Diagnosis not present

## 2020-09-22 DIAGNOSIS — Z20822 Contact with and (suspected) exposure to covid-19: Secondary | ICD-10-CM | POA: Diagnosis not present

## 2020-09-22 DIAGNOSIS — J449 Chronic obstructive pulmonary disease, unspecified: Secondary | ICD-10-CM | POA: Diagnosis not present

## 2020-09-22 DIAGNOSIS — E119 Type 2 diabetes mellitus without complications: Secondary | ICD-10-CM | POA: Diagnosis not present

## 2020-09-22 DIAGNOSIS — Z79899 Other long term (current) drug therapy: Secondary | ICD-10-CM | POA: Diagnosis not present

## 2020-09-22 DIAGNOSIS — Z7984 Long term (current) use of oral hypoglycemic drugs: Secondary | ICD-10-CM | POA: Diagnosis not present

## 2020-09-22 LAB — GLUCOSE, CAPILLARY
Glucose-Capillary: 129 mg/dL — ABNORMAL HIGH (ref 70–99)
Glucose-Capillary: 180 mg/dL — ABNORMAL HIGH (ref 70–99)
Glucose-Capillary: 135 mg/dL — ABNORMAL HIGH (ref 70–99)
Glucose-Capillary: 171 mg/dL — ABNORMAL HIGH (ref 70–99)

## 2020-09-22 NOTE — Progress Notes (Signed)
SPORTS MEDICINE AND JOINT REPLACEMENT  Lara Mulch, MD    Carlyon Shadow, PA-C [place], [place],   [address]                             [phone]   PROGRESS NOTE  Subjective:  negative for Chest Pain  negative for Shortness of Breath  negative for Nausea/Vomiting   negative for Calf Pain  negative for Bowel Movement   Tolerating Diet: yes         Patient reports pain as 2 on 0-10 scale.    Objective: Vital signs in last 24 hours:   Patient Vitals for the past 24 hrs:  BP Temp Temp src Pulse Resp SpO2  09/22/20 1335 (!) 103/58 98.1 F (36.7 C) Oral 99 14 99 %  09/22/20 0504 (!) 144/69 99.1 F (37.3 C) Oral 70 17 99 %  09/21/20 2134 (!) 122/56 98.5 F (36.9 C) Oral 72 16 99 %    @flow {1959:LAST@   Intake/Output from previous day:   03/25 0701 - 03/26 0700 In: 0  Out: 2650 [Urine:2650]   Intake/Output this shift:   03/26 0701 - 03/26 1900 In: 480 [P.O.:480] Out: 700 [Urine:700]   Intake/Output      03/25 0701 03/26 0700 03/26 0701 03/27 0700   P.O.  480   I.V. (mL/kg) 0 (0)    IV Piggyback     Total Intake(mL/kg) 0 (0) 480 (4.7)   Urine (mL/kg/hr) 2650 (1.1) 700 (0.6)   Stool  0   Total Output 2650 700   Net -2650 -220        Urine Occurrence  2 x   Stool Occurrence  1 x      LABORATORY DATA: Recent Labs    09/19/20 0808 09/19/20 1912  WBC 8.5 9.3  HGB 9.6* 8.5*  HCT 28.3* 25.5*  PLT 168 166   Recent Labs    09/19/20 0808  NA 139  K 4.2  CL 104  CO2 24  BUN 35*  CREATININE 1.34*  GLUCOSE 169*  CALCIUM 9.4   Lab Results  Component Value Date   INR 1.0 09/15/2019    Examination:  General appearance: alert, cooperative and no distress Extremities: extremities normal, atraumatic, no cyanosis or edema  Wound Exam: clean, dry, intact   Drainage:  None: wound tissue dry  Motor Exam: Opposition, Pinch and Wrist Dorsiflexion Intact  Sensory Exam: Radial and Ulnar normal   Assessment:    3 Days Post-Op   Procedure(s) (LRB): REVERSE SHOULDER ARTHROPLASTY (Right)  ADDITIONAL DIAGNOSIS:  Active Problems:   Status post reverse total arthroplasty of right shoulder     Plan: Physical Therapy as ordered Non Weight Bearing (NWB)  DVT Prophylaxis:  Xarelto  DISCHARGE PLAN: Skilled Nursing Facility/Rehab  Patient doing well and resting comfortably in bed. Pain under control. Waiting on bed availability for SNF discharge. Will continue to follow over weekend.   Donia Ast 09/22/2020, 5:49 PM

## 2020-09-22 NOTE — Progress Notes (Signed)
Physical Therapy Treatment Patient Details Name: Doris Jackson MRN: [id number] DOB: 05/08/1941 Today's Date: 09/22/2020    History of Present Illness Patient is a 80 year old female admitted 3/23 for R reverse total shoulder arthroplasty due to fall on 3/17 resulting in proximal humerus fracture. PMH includes HTN, HLD, GERD, DM2, COPD, anterior cervical disectomy    PT Comments    Pt up from commode following assistance of nursing for hygiene and assisted to ambulate limited distance in room.   Pt encouraged to attempt ambulation to hall with chair following but insistent that her legs are too weak and she just can't do it.     Follow Up Recommendations  SNF     Equipment Recommendations  None recommended by PT    Recommendations for Other Services       Precautions / Restrictions Precautions Precautions: Fall;Shoulder Type of Shoulder Precautions: AROM at elbow, wrist, hand ok. A/PROM shoulder NO Shoulder Interventions: Shoulder sling/immobilizer;Off for dressing/bathing/exercises Precaution Booklet Issued: Yes (comment) Required Braces or Orthoses: Sling Restrictions Weight Bearing Restrictions: Yes RUE Weight Bearing: Non weight bearing    Mobility  Bed Mobility               General bed mobility comments: NT - Pt up from comode on arrival and to chair at session end    Transfers Overall transfer level: Needs assistance Equipment used: Rolling walker (2 wheeled) Transfers: Sit to/from Stand Sit to Stand: Min assist         General transfer comment: cues for use of L UE to self assist;  Physical assist to bring wt up and fwd and to balance in initial standing  Ambulation/Gait Ambulation/Gait assistance: Min assist Gait Distance (Feet): 16 Feet Assistive device: Rolling walker (2 wheeled) Gait Pattern/deviations: Step-through pattern;Decreased stride length;Wide base of support Gait velocity: decr   General Gait Details: Pt ambulated with one hand on RW  and light assist from PT to manage RW (unstable with attempted use of QC on previous session).  Distance ltd by fatigue, pt fearful of falling   Stairs             Wheelchair Mobility    Modified Rankin (Stroke Patients Only)       Balance Overall balance assessment: History of Falls;Needs assistance Sitting-balance support: Feet supported Sitting balance-Leahy Scale: Fair     Standing balance support: Single extremity supported Standing balance-Leahy Scale: Poor Standing balance comment: reliant on UE support for static stand                            Cognition Arousal/Alertness: Awake/alert Behavior During Therapy: WFL for tasks assessed/performed Overall Cognitive Status: Within Functional Limits for tasks assessed                                 General Comments: occasional redirection to task      Exercises      General Comments        Pertinent Vitals/Pain Pain Assessment: Faces Faces Pain Scale: Hurts little more Pain Location: R shoulder Pain Descriptors / Indicators: Grimacing;Guarding Pain Intervention(s): Limited activity within patient's tolerance    Home Living                      Prior Function            PT Goals (current goals  can now be found in the care plan section) Acute Rehab PT Goals Patient Stated Goal: care for myself PT Goal Formulation: With patient Time For Goal Achievement: 09/27/20 Potential to Achieve Goals: Fair Progress towards PT goals: Progressing toward goals    Frequency    Min 4X/week      PT Plan Current plan remains appropriate    Co-evaluation              AM-PAC PT "6 Clicks" Mobility   Outcome Measure  Help needed turning from your back to your side while in a flat bed without using bedrails?: A Lot Help needed moving from lying on your back to sitting on the side of a flat bed without using bedrails?: A Lot Help needed moving to and from a bed to a  chair (including a wheelchair)?: A Lot Help needed standing up from a chair using your arms (e.g., wheelchair or bedside chair)?: A Little Help needed to walk in hospital room?: A Little Help needed climbing 3-5 steps with a railing? : A Lot 6 Click Score: 14    End of Session Equipment Utilized During Treatment: Gait belt;Other (comment) Activity Tolerance: Patient tolerated treatment well;Patient limited by fatigue Patient left: in chair;with nursing/sitter in room Nurse Communication: Mobility status PT Visit Diagnosis: Unsteadiness on feet (R26.81);Muscle weakness (generalized) (M62.81);History of falling (Z91.81);Difficulty in walking, not elsewhere classified (R26.2)     Time: 5883-2549 PT Time Calculation (min) (ACUTE ONLY): 13 min  Charges:  $Gait Training: 8-22 mins                     [place] Pager [phone] Office [phone]    Zi New[place] 09/22/2020, 3:25 PM

## 2020-09-22 NOTE — Progress Notes (Signed)
Physical Therapy Treatment Patient Details Name: Doris Jackson MRN: [id number] DOB: 10-05-1940 Today's Date: 09/22/2020    History of Present Illness Patient is a 80 year old female admitted 3/23 for R reverse total shoulder arthroplasty due to fall on 3/17 resulting in proximal humerus fracture. PMH includes HTN, HLD, GERD, DM2, COPD, anterior cervical disectomy    PT Comments    Pt cooperative but requiring encouragement to participate with PT.  Pt assisted from bed to standing but then requesting assist to bathroom.  Pt ambulated limited distance and assisted to commode.  Pt states "I'm going to need some time"  Call light provided.  Follow Up Recommendations  SNF     Equipment Recommendations  None recommended by PT    Recommendations for Other Services       Precautions / Restrictions Precautions Precautions: Fall;Shoulder Type of Shoulder Precautions: AROM at elbow, wrist, hand ok. A/PROM shoulder NO Shoulder Interventions: Shoulder sling/immobilizer;Off for dressing/bathing/exercises Required Braces or Orthoses: Sling Restrictions Weight Bearing Restrictions: Yes RUE Weight Bearing: Non weight bearing    Mobility  Bed Mobility Overal bed mobility: Needs Assistance Bed Mobility: Supine to Sit     Supine to sit: HOB elevated;Min assist     General bed mobility comments: Increased time with cues for sequencing and use of bed rail. min assist to elevate trunk. (sleeps in recliner at home)    Transfers Overall transfer level: Needs assistance Equipment used: Rolling walker (2 wheeled) Transfers: Sit to/from Stand Sit to Stand: Min assist         General transfer comment: cues for use of L UE to self assist;  Physical assist to bring wt up and fwd and to balance in initial standing  Ambulation/Gait Ambulation/Gait assistance: Min assist Gait Distance (Feet): 14 Feet Assistive device: Rolling walker (2 wheeled) Gait Pattern/deviations: Step-through  pattern;Decreased stride length;Wide base of support Gait velocity: decr   General Gait Details: Pt ambulated with one hand on RW and light assist from PT to manage RW (unstable with attempted use of QC on previous session).  Distance ltd by fatigue, pt fearful of falling   Stairs             Wheelchair Mobility    Modified Rankin (Stroke Patients Only)       Balance Overall balance assessment: History of Falls;Needs assistance Sitting-balance support: Feet supported Sitting balance-Leahy Scale: Fair     Standing balance support: Single extremity supported Standing balance-Leahy Scale: Poor Standing balance comment: reliant on UE support for static stand                            Cognition Arousal/Alertness: Awake/alert Behavior During Therapy: WFL for tasks assessed/performed Overall Cognitive Status: Within Functional Limits for tasks assessed                                 General Comments: occasional redirection to task      Exercises      General Comments        Pertinent Vitals/Pain Pain Assessment: Faces Faces Pain Scale: Hurts little more Pain Location: R shoulder Pain Descriptors / Indicators: Grimacing;Guarding Pain Intervention(s): Limited activity within patient's tolerance;Monitored during session;Premedicated before session    Home Living                      Prior Function  PT Goals (current goals can now be found in the care plan section) Acute Rehab PT Goals Patient Stated Goal: care for myself PT Goal Formulation: With patient Time For Goal Achievement: 09/27/20 Potential to Achieve Goals: Fair Progress towards PT goals: Progressing toward goals    Frequency    Min 4X/week      PT Plan Current plan remains appropriate    Co-evaluation              AM-PAC PT "6 Clicks" Mobility   Outcome Measure  Help needed turning from your back to your side while in a flat bed  without using bedrails?: A Lot Help needed moving from lying on your back to sitting on the side of a flat bed without using bedrails?: A Lot Help needed moving to and from a bed to a chair (including a wheelchair)?: A Lot Help needed standing up from a chair using your arms (e.g., wheelchair or bedside chair)?: A Little Help needed to walk in hospital room?: A Little Help needed climbing 3-5 steps with a railing? : A Lot 6 Click Score: 14    End of Session Equipment Utilized During Treatment: Gait belt;Other (comment) Activity Tolerance: Patient tolerated treatment well;Patient limited by fatigue Patient left: Other (comment) (bathroom) Nurse Communication: Mobility status PT Visit Diagnosis: Unsteadiness on feet (R26.81);Muscle weakness (generalized) (M62.81);History of falling (Z91.81);Difficulty in walking, not elsewhere classified (R26.2)     Time: 1050-1105 PT Time Calculation (min) (ACUTE ONLY): 15 min  Charges:  $Gait Training: 8-22 mins                     [place] Pager [phone] Office [phone]    Amado Andal 09/22/2020, 3:10 PM

## 2020-09-22 NOTE — Plan of Care (Signed)
  Problem: Education: Goal: Knowledge of the prescribed therapeutic regimen will improve Outcome: Progressing   Problem: Activity: Goal: Ability to tolerate increased activity will improve Outcome: Progressing   Problem: Pain Management: Goal: Pain level will decrease with appropriate interventions Outcome: Progressing   

## 2020-09-23 DIAGNOSIS — S42201A Unspecified fracture of upper end of right humerus, initial encounter for closed fracture: Secondary | ICD-10-CM | POA: Diagnosis not present

## 2020-09-23 DIAGNOSIS — Z20822 Contact with and (suspected) exposure to covid-19: Secondary | ICD-10-CM | POA: Diagnosis not present

## 2020-09-23 DIAGNOSIS — I10 Essential (primary) hypertension: Secondary | ICD-10-CM | POA: Diagnosis not present

## 2020-09-23 DIAGNOSIS — J449 Chronic obstructive pulmonary disease, unspecified: Secondary | ICD-10-CM | POA: Diagnosis not present

## 2020-09-23 DIAGNOSIS — E119 Type 2 diabetes mellitus without complications: Secondary | ICD-10-CM | POA: Diagnosis not present

## 2020-09-23 DIAGNOSIS — S42231A 3-part fracture of surgical neck of right humerus, initial encounter for closed fracture: Secondary | ICD-10-CM | POA: Diagnosis not present

## 2020-09-23 DIAGNOSIS — Z79899 Other long term (current) drug therapy: Secondary | ICD-10-CM | POA: Diagnosis not present

## 2020-09-23 DIAGNOSIS — Z853 Personal history of malignant neoplasm of breast: Secondary | ICD-10-CM | POA: Diagnosis not present

## 2020-09-23 DIAGNOSIS — W1830XA Fall on same level, unspecified, initial encounter: Secondary | ICD-10-CM | POA: Diagnosis not present

## 2020-09-23 DIAGNOSIS — Z7984 Long term (current) use of oral hypoglycemic drugs: Secondary | ICD-10-CM | POA: Diagnosis not present

## 2020-09-23 LAB — GLUCOSE, CAPILLARY
Glucose-Capillary: 124 mg/dL — ABNORMAL HIGH (ref 70–99)
Glucose-Capillary: 154 mg/dL — ABNORMAL HIGH (ref 70–99)
Glucose-Capillary: 173 mg/dL — ABNORMAL HIGH (ref 70–99)
Glucose-Capillary: 204 mg/dL — ABNORMAL HIGH (ref 70–99)

## 2020-09-23 NOTE — Progress Notes (Signed)
Orthopaedic Trauma Progress Note  SUBJECTIVE: Reports minimal to mild pain about operative site, controlled on current regimen. No chest pain. No SOB. No nausea/vomiting. No other complaints.  Is working well with therapies, awaiting SNF. Blood pressures have been stable but overall running a little low over the weekend. Having no symptoms.   OBJECTIVE:  Vitals:   09/23/20 0446 09/23/20 0456  BP: 109/60   Pulse: 99   Resp: 17   Temp: 98.1 F (36.7 C)   SpO2: (!) 84% 97%    General: Resting comfortably, no acute distress Respiratory: No increased work of breathing.  Right upper extremity: Dressing clean, dry, intact.  Tender about the shoulder as expected. Wrist motion intact without pain.  Motor and sensory function intact over the axillary nerve as well as through the median, ulnar, radial nerve distributions. Warm, well perfused digits.    LABS:  Results for orders placed or performed during the hospital encounter of 09/19/20 (from the past 24 hour(s))  Glucose, capillary     Status: Abnormal   Collection Time: 09/22/20  7:34 AM  Result Value Ref Range   Glucose-Capillary 129 (H) 70 - 99 mg/dL  Glucose, capillary     Status: Abnormal   Collection Time: 09/22/20 11:22 AM  Result Value Ref Range   Glucose-Capillary 180 (H) 70 - 99 mg/dL  Glucose, capillary     Status: Abnormal   Collection Time: 09/22/20  4:11 PM  Result Value Ref Range   Glucose-Capillary 135 (H) 70 - 99 mg/dL  Glucose, capillary     Status: Abnormal   Collection Time: 09/22/20  8:52 PM  Result Value Ref Range   Glucose-Capillary 171 (H) 70 - 99 mg/dL    ASSESSMENT: Doris Jackson is a 80 y.o. female, 4 Days Post-Op s/p Procedure(s): REVERSE SHOULDER ARTHROPLASTY  PLAN: Weightbearing: NWB RUE Incisional and dressing care: Reinforce dressings as needed  Showering: Okay to begin showering with assistance Orthopedic device(s): Sling  Pain management: PRN pain medications, preferring oral meds VTE  prophylaxis: Xarelto, SCDs, ambulation  Dispo: Therapies as tolerated, PT/OT recommending SNF, patient is agreeable to this.  TOC following and is awaiting insurance authorization.  Patient medically stable for discharge once insurance authorization is approved.  We will continue to monitor blood pressure.  Follow - up plan: 1 week with Dr. Damien Fusi information:  Katha Hamming MD, Patrecia Pace PA-C Sunday, 09/24/2018. After hours and holidays please check [email or web address] for group call information for [facility]   Skiler Tye A. Ricci Barker, PA-C [phone] (office) [email or web address]

## 2020-09-23 NOTE — Plan of Care (Signed)
°  Problem: Pain Management: °Goal: Pain level will decrease with appropriate interventions °Outcome: Progressing °  °Problem: Safety: °Goal: Ability to remain free from injury will improve °Outcome: Progressing °  °

## 2020-09-23 NOTE — Plan of Care (Signed)
  Problem: Pain Management: Goal: Pain level will decrease with appropriate interventions Outcome: Progressing   

## 2020-09-23 NOTE — Progress Notes (Signed)
Occupational Therapy Treatment Patient Details Name: Doris Jackson MRN: [id number] DOB: [birth year] Today's Date: 09/23/2020    History of present illness Patient is a 80 year old female admitted 3/23 for R reverse total shoulder arthroplasty due to fall on 3/17 resulting in proximal humerus fracture. PMH includes HTN, HLD, GERD, DM2, COPD, anterior cervical disectomy   OT comments  Treatment focused on perform self care task at side of bed, reiteration of shoulder precautions and compensatory strategies and exercise program. Patient continues to need reiteration of all education and instruction. Patient reporting mild dizziness and nausea throughout treatment therefore ambulation deferred and patient only pivoted to chair. Continue to recommend short term rehab at discharge - patient not fully maintaining education consistently and a high fall risk.   Follow Up Recommendations  SNF;Other (comment)    Equipment Recommendations   ([facility])    Recommendations for Other Services      Precautions / Restrictions Precautions Precautions: Fall;Shoulder Type of Shoulder Precautions: AROM at elbow, wrist, hand ok. A/PROM shoulder NO Shoulder Interventions: Shoulder sling/immobilizer;Off for dressing/bathing/exercises Precaution Booklet Issued: Yes (comment) Required Braces or Orthoses: Sling Restrictions Weight Bearing Restrictions: Yes RUE Weight Bearing: Non weight bearing       Mobility Bed Mobility Overal bed mobility: Needs Assistance Bed Mobility: Supine to Sit     Supine to sit: HOB elevated;Min assist     General bed mobility comments: hand hold to pull patient up to transfer to side of bed, needed increased time. patient reporting mildly nauseous and "swimmy headed"    Transfers Overall transfer level: Needs assistance Equipment used: Hemi-walker Transfers: Sit to/from [facility] Sit to Stand: Min guard Stand pivot transfers: Min guard        General transfer comment: Min guard to stand from elevated bed height x 2. With standing patient reports her legs feeling "a little wobbly" and had reports light headedness with supine and seated position. BP taken in sitting with systolic 503. No significant increase in complaints with standing however ambulation deferred. Patient performed stand pivot with hemiwalker to recliner only.    Balance Overall balance assessment: History of Falls;Needs assistance Sitting-balance support: Feet supported Sitting balance-Leahy Scale: Fair     Standing balance support: Single extremity supported Standing balance-Leahy Scale: Poor                             ADL either performed or assessed with clinical judgement   ADL Overall ADL's : Needs assistance/impaired     Grooming: Set up;Wash/dry face;Wash/dry hands;Sitting;Oral care Grooming Details (indicate cue type and reason): at side of bed Upper Body Bathing: Moderate assistance;Sitting;Set up Upper Body Bathing Details (indicate cue type and reason): Mod assist for UB bathing at side of bed. Reiteration of how to manage RUE with bating - adhering to UE precautions. Instructed patient on how to instruct caregiver.                                 Vision Baseline Vision/History: Wears glasses Wears Glasses: At all times     Perception     Praxis      Cognition Arousal/Alertness: Awake/alert Behavior During Therapy: [facility] for tasks assessed/performed Overall Cognitive Status: Within Functional Limits for tasks assessed  General Comments: Needs reiteration of education        Exercises     Shoulder Instructions Shoulder Instructions Method for sponge bathing under operated UE:  (Patient instructed. verbalizes understanding.) Correct positioning of sling/immobilizer:  (patient instructed. patient verbalizes understanding.) ROM for elbow, wrist and digits of  operated UE:  (Performed each exercise x 10. Encouarged patient to perform without therapist presence.) Positioning of UE while sleeping:  (Instructed patient with positioning in reclilner. Able to remember 1/2 positioning point.)     General Comments      Pertinent Vitals/ Pain       Pain Assessment: Faces Faces Pain Scale: Hurts little more Pain Location: R shoulder Pain Descriptors / Indicators: Grimacing;Guarding Pain Intervention(s): Limited activity within patient's tolerance;Monitored during session;Premedicated before session  Home Living                                          Prior Functioning/Environment              Frequency  Min 1X/week        Progress Toward Goals  OT Goals(current goals can now be found in the care plan section)  Progress towards OT goals: Progressing toward goals  Acute Rehab OT Goals Patient Stated Goal: care for myself OT Goal Formulation: With patient Time For Goal Achievement: 10/04/20 Potential to Achieve Goals: Good  Plan Discharge plan remains appropriate    Co-evaluation          OT goals addressed during session: ADL's and self-care;Strengthening/ROM      AM-PAC OT "6 Clicks" Daily Activity     Outcome Measure   Help from another person eating meals?: None Help from another person taking care of personal grooming?: A Little Help from another person toileting, which includes using toliet, bedpan, or urinal?: A Lot Help from another person bathing (including washing, rinsing, drying)?: A Lot Help from another person to put on and taking off regular upper body clothing?: A Lot Help from another person to put on and taking off regular lower body clothing?: Total 6 Click Score: 14    End of Session Equipment Utilized During Treatment: Gait belt  OT Visit Diagnosis: Unsteadiness on feet (R26.81);History of falling (Z91.81);Pain Pain - Right/Left: Right Pain - part of body: Shoulder   Activity  Tolerance Patient tolerated treatment well   Patient Left in chair;with call bell/phone within reach;with chair alarm set   Nurse Communication  (reports of dizziness)        Time: 1443-1540 OT Time Calculation (min): 45 min  Charges: OT General Charges $OT Visit: 1 Visit OT Treatments $Self Care/Home Management : 23-37 mins $Therapeutic Exercise: 8-22 mins  Ladaysha Soutar, OTR/L [place]  Office [phone] Pager: [place] 09/23/2020, 1:15 PM

## 2020-09-24 ENCOUNTER — Observation Stay (HOSPITAL_COMMUNITY): Payer: Medicare HMO

## 2020-09-24 DIAGNOSIS — Z7984 Long term (current) use of oral hypoglycemic drugs: Secondary | ICD-10-CM | POA: Diagnosis not present

## 2020-09-24 DIAGNOSIS — S42231A 3-part fracture of surgical neck of right humerus, initial encounter for closed fracture: Secondary | ICD-10-CM | POA: Diagnosis not present

## 2020-09-24 DIAGNOSIS — I10 Essential (primary) hypertension: Secondary | ICD-10-CM | POA: Diagnosis not present

## 2020-09-24 DIAGNOSIS — J449 Chronic obstructive pulmonary disease, unspecified: Secondary | ICD-10-CM | POA: Diagnosis not present

## 2020-09-24 DIAGNOSIS — Z471 Aftercare following joint replacement surgery: Secondary | ICD-10-CM | POA: Diagnosis not present

## 2020-09-24 DIAGNOSIS — E119 Type 2 diabetes mellitus without complications: Secondary | ICD-10-CM | POA: Diagnosis not present

## 2020-09-24 DIAGNOSIS — Z853 Personal history of malignant neoplasm of breast: Secondary | ICD-10-CM | POA: Diagnosis not present

## 2020-09-24 DIAGNOSIS — Z96611 Presence of right artificial shoulder joint: Secondary | ICD-10-CM | POA: Diagnosis not present

## 2020-09-24 DIAGNOSIS — Z20822 Contact with and (suspected) exposure to covid-19: Secondary | ICD-10-CM | POA: Diagnosis not present

## 2020-09-24 DIAGNOSIS — Z79899 Other long term (current) drug therapy: Secondary | ICD-10-CM | POA: Diagnosis not present

## 2020-09-24 DIAGNOSIS — S42201A Unspecified fracture of upper end of right humerus, initial encounter for closed fracture: Secondary | ICD-10-CM | POA: Diagnosis not present

## 2020-09-24 DIAGNOSIS — R600 Localized edema: Secondary | ICD-10-CM | POA: Diagnosis not present

## 2020-09-24 DIAGNOSIS — W1830XA Fall on same level, unspecified, initial encounter: Secondary | ICD-10-CM | POA: Diagnosis not present

## 2020-09-24 LAB — BASIC METABOLIC PANEL
Anion gap: 7 (ref 5–15)
BUN: 46 mg/dL — ABNORMAL HIGH (ref 8–23)
CO2: 27 mmol/L (ref 22–32)
Calcium: 8.7 mg/dL — ABNORMAL LOW (ref 8.9–10.3)
Chloride: 103 mmol/L (ref 98–111)
Creatinine, Ser: 1.24 mg/dL — ABNORMAL HIGH (ref 0.44–1.00)
GFR, Estimated: 45 mL/min — ABNORMAL LOW (ref >60)
Glucose, Bld: 107 mg/dL — ABNORMAL HIGH (ref 70–99)
Potassium: 4.7 mmol/L (ref 3.5–5.1)
Sodium: 137 mmol/L (ref 135–145)

## 2020-09-24 LAB — GLUCOSE, CAPILLARY
Glucose-Capillary: 95 mg/dL (ref 70–99)
Glucose-Capillary: 129 mg/dL — ABNORMAL HIGH (ref 70–99)
Glucose-Capillary: 94 mg/dL (ref 70–99)
Glucose-Capillary: 155 mg/dL — ABNORMAL HIGH (ref 70–99)

## 2020-09-24 LAB — CBC
HCT: 22 % — ABNORMAL LOW (ref 36.0–46.0)
Hemoglobin: 7.2 g/dL — ABNORMAL LOW (ref 12.0–15.0)
MCH: 26.6 pg (ref 26.0–34.0)
MCHC: 32.7 g/dL (ref 30.0–36.0)
MCV: 81.2 fL (ref 80.0–100.0)
Platelets: 221 10*3/uL (ref 150–400)
RBC: 2.71 MIL/uL — ABNORMAL LOW (ref 3.87–5.11)
RDW: 16 % — ABNORMAL HIGH (ref 11.5–15.5)
WBC: 8 10*3/uL (ref 4.0–10.5)
nRBC: 0.3 % — ABNORMAL HIGH (ref 0.0–0.2)

## 2020-09-24 MED ORDER — FAMOTIDINE 20 MG PO TABS
40.0000 mg | ORAL_TABLET | Freq: Every day | ORAL | Status: DC
  Filled 2020-09-24: qty 2

## 2020-09-24 MED ORDER — OXYCODONE HCL 5 MG PO TABS: 10.0000 mg | ORAL_TABLET | Freq: Four times a day (QID) | ORAL | Status: DC | PRN

## 2020-09-24 MED ORDER — VITAMIN B-12 1000 MCG PO TABS
1000.0000 ug | ORAL_TABLET | Freq: Every day | ORAL | Status: DC
  Administered 2020-09-25 – 2020-09-27 (×3): 1000 ug via ORAL
  Filled 2020-09-24 (×3): qty 1

## 2020-09-24 MED ORDER — AMLODIPINE BESYLATE 10 MG PO TABS: 10.0000 mg | ORAL_TABLET | Freq: Every day | ORAL | Status: DC

## 2020-09-24 MED ORDER — POLYSACCHARIDE IRON COMPLEX 150 MG PO CAPS
150.0000 mg | ORAL_CAPSULE | Freq: Every day | ORAL | Status: DC
  Administered 2020-09-25 – 2020-09-27 (×3): 150 mg via ORAL
  Filled 2020-09-24 (×3): qty 1

## 2020-09-24 MED ORDER — DULOXETINE HCL 20 MG PO CPEP
20.0000 mg | ORAL_CAPSULE | ORAL | Status: DC
  Filled 2020-09-24 (×2): qty 1

## 2020-09-24 MED ORDER — OXYCODONE HCL 5 MG PO TABS
5.0000 mg | ORAL_TABLET | Freq: Four times a day (QID) | ORAL | Status: DC | PRN
  Administered 2020-09-24 – 2020-09-27 (×5): 5 mg via ORAL
  Filled 2020-09-24 (×7): qty 1

## 2020-09-24 MED ORDER — ACETAMINOPHEN 500 MG PO TABS
1000.0000 mg | ORAL_TABLET | Freq: Three times a day (TID) | ORAL | Status: DC
  Administered 2020-09-24 – 2020-09-26 (×9): 1000 mg via ORAL
  Filled 2020-09-24 (×10): qty 2

## 2020-09-24 MED ORDER — ACETAMINOPHEN 325 MG PO TABS: 650.0000 mg | ORAL_TABLET | Freq: Four times a day (QID) | ORAL | Status: DC | PRN

## 2020-09-24 MED ORDER — LACTATED RINGERS IV BOLUS
500.0000 mL | Freq: Once | INTRAVENOUS | Status: AC
  Administered 2020-09-24: 500 mL via INTRAVENOUS

## 2020-09-24 MED ORDER — PANTOPRAZOLE SODIUM 40 MG PO TBEC
80.0000 mg | DELAYED_RELEASE_TABLET | Freq: Every day | ORAL | Status: DC
  Administered 2020-09-25 – 2020-09-27 (×3): 80 mg via ORAL
  Filled 2020-09-24 (×3): qty 2

## 2020-09-24 NOTE — TOC Progression Note (Signed)
Transition of Care [facility]) - Progression Note    Patient Details  Name: Doris Jackson MRN: [id number] Date of Birth: 05/22/1941  Transition of Care [facility]) CM/SW Contact  Joaquin Courts, RN Phone Number: 09/24/2020, 11:34 AM  Clinical Narrative:    Attempted to reach Westley Chandler at [facility] regarding auth status, called twice and left VM, waiting for response.    Expected Discharge Plan: Skilled Nursing Facility Barriers to Discharge: Insurance Authorization  Expected Discharge Plan and Services Expected Discharge Plan: [place] In-house Referral: Clinical Social Work     Living arrangements for the past 2 months: Single Family Home                                       Social Determinants of Health (SDOH) Interventions    Readmission Risk Interventions Readmission Risk Prevention Plan 09/16/2019  Transportation Screening Complete  PCP or Specialist Appt within 3-5 Days Complete  HRI or [place] Complete  Social Work Consult for [place] Planning/Counseling Complete  Palliative Care Screening Not Applicable  Medication Review Press photographer) Complete  Some recent data might be hidden

## 2020-09-24 NOTE — Plan of Care (Signed)
Plan of care discussed with pt.

## 2020-09-24 NOTE — Telephone Encounter (Signed)
Left a message for the pt to return my call.  

## 2020-09-24 NOTE — Progress Notes (Signed)
Physical Therapy Treatment Patient Details Name: Doris Jackson MRN: [id number] DOB: 23-Sep-1940 Today's Date: 09/24/2020    History of Present Illness Patient is a 80 year old female admitted 3/23 for R reverse total shoulder arthroplasty due to fall on 3/17 resulting in proximal humerus fracture. PMH includes HTN, HLD, GERD, DM2, COPD, anterior cervical disectomy    PT Comments    Progressing slowly with mobility. Pt requires encouragement to increase ambulation distance. She is fearful of falling. Increased time to complete all tasks. Cues required throughout session. Continue to recommend ST rehab stay at SNF to improve safety and regain independence prior to returning home.     Follow Up Recommendations  SNF     Equipment Recommendations  None recommended by PT    Recommendations for Other Services       Precautions / Restrictions Precautions Precautions: Fall;Shoulder Type of Shoulder Precautions: AROM at elbow/wrist/hand ok. NO A/PROM shoulder  Shoulder Interventions: Shoulder sling/immobilizer;Off for dressing/bathing/exercises Required Braces or Orthoses: Sling Restrictions Weight Bearing Restrictions: Yes RUE Weight Bearing: Non weight bearing    Mobility  Bed Mobility Overal bed mobility: Needs Assistance Bed Mobility: Supine to Sit     Supine to sit: HOB elevated;Mod assist     General bed mobility comments: Assist for trunk and to scoot to EOB. Increased time. Cues for safety, technique, and for pt to do as much for herself as she can    Transfers Overall transfer level: Needs assistance Equipment used: Rolling walker (2 wheeled) Transfers: Sit to/from Stand Sit to Stand: Min assist         General transfer comment: Assist to steady once standing. Cues for safety, technique, hand placement. Increased time.  Ambulation/Gait Ambulation/Gait assistance: Min assist Gait Distance (Feet): 18 Feet Assistive device: Rolling walker (2 wheeled) Gait  Pattern/deviations: Step-through pattern;Decreased stride length     General Gait Details: Pt ambulated with one hand on RW and light assist from PT to manage RW. Distance ltd by fatigue. Pt is fearful of falling and requires encouragement to increase ambulation distance.   Stairs             Wheelchair Mobility    Modified Rankin (Stroke Patients Only)       Balance Overall balance assessment: History of Falls         Standing balance support: Single extremity supported Standing balance-Leahy Scale: Poor                              Cognition Arousal/Alertness: Awake/alert Behavior During Therapy: WFL for tasks assessed/performed Overall Cognitive Status: Within Functional Limits for tasks assessed                                 General Comments: repeated cueing required thorughout session      Exercises      General Comments        Pertinent Vitals/Pain Pain Assessment: Faces Faces Pain Scale: Hurts little more Pain Location: R shoulder Pain Descriptors / Indicators: Guarding;Discomfort Pain Intervention(s): Limited activity within patient's tolerance;Monitored during session;Repositioned    Home Living                      Prior Function            PT Goals (current goals can now be found in the care plan section) Progress towards  PT goals: Progressing toward goals    Frequency    Min 3X/week      PT Plan Frequency needs to be updated    Co-evaluation              AM-PAC PT "6 Clicks" Mobility   Outcome Measure  Help needed turning from your back to your side while in a flat bed without using bedrails?: A Lot Help needed moving from lying on your back to sitting on the side of a flat bed without using bedrails?: A Lot Help needed moving to and from a bed to a chair (including a wheelchair)?: A Little Help needed standing up from a chair using your arms (e.g., wheelchair or bedside chair)?: A  Little Help needed to walk in hospital room?: A Little Help needed climbing 3-5 steps with a railing? : A Lot 6 Click Score: 15    End of Session Equipment Utilized During Treatment: Gait belt (sling) Activity Tolerance: Patient tolerated treatment well Patient left: in chair;with call bell/phone within reach;with chair alarm set   PT Visit Diagnosis: Unsteadiness on feet (R26.81);Muscle weakness (generalized) (M62.81);History of falling (Z91.81);Difficulty in walking, not elsewhere classified (R26.2)     Time: 4862-8241 PT Time Calculation (min) (ACUTE ONLY): 29 min  Charges:  $Gait Training: 23-37 mins                        Doreatha Massed, PT Acute Rehabilitation  Office: [phone] Pager: [phone]

## 2020-09-24 NOTE — TOC Progression Note (Signed)
Transition of Care [facility]) - Progression Note    Patient Details  Name: Doris Jackson MRN: [id number] Date of Birth: [birth year]  Transition of [facility]) CM/SW Contact  Joaquin Courts, RN Phone Number: 09/24/2020, 2:31 PM  Clinical Narrative:    Per Loma [place] at [facility], Josem Kaufmann is still pending.   Expected Discharge Plan: Skilled Nursing Facility Barriers to Discharge: Insurance Authorization  Expected Discharge Plan and Services Expected Discharge Plan: [place] In-house Referral: Clinical Social Work     Living arrangements for the past 2 months: Single Family Home                                       Social Determinants of Health (SDOH) Interventions    Readmission Risk Interventions Readmission Risk Prevention Plan 09/16/2019  Transportation Screening Complete  PCP or Specialist Appt within 3-5 Days Complete  HRI or [place] Complete  Social Work Consult for [place] Planning/Counseling Complete  Palliative Care Screening Not Applicable  Medication Review Press photographer) Complete  Some recent data might be hidden

## 2020-09-24 NOTE — Progress Notes (Signed)
   ORTHOPAEDIC PROGRESS NOTE  s/p Procedure(s): REVERSE SHOULDER ARTHROPLASTY for fracture on 09/19/2020 with Dr. Griffin Basil  SUBJECTIVE: Reports some pain in her right arm this morning. No abdominal pain. Tolerating diet. Voiding well. Had BM two days ago. No chest pain. No SOB. No nausea/vomiting. No other complaints. Still waiting on SNF placement.   OBJECTIVE: PE: General: sitting up in hospital bed, NAD Cardiac: regular rate Respiratory: no increased work of breathing GI: abdomen soft, non-tender Right upper extremity: Dressing CDI and sling well fitting. + Motor in  AIN, PIN, Ulnar distributions. Axillary nerve sensation preserved.  Sensation intact in medial, radial, and ulnar distributions. Well perfused digits.      Vitals:   09/23/20 2129 09/24/20 0443  BP: 129/70 135/79  Pulse: 76 76  Resp: 16 17  Temp: 98.5 F (36.9 C) 99 F (37.2 C)  SpO2: 98% 92%     ASSESSMENT: GORDON CARLSON is a 80 y.o. female doing well postoperatively. POD#5  PLAN: Weightbearing: NWB RUE Insicional and dressing care: Reinforce dressings as needed Orthopedic device(s): Sling Showering: Post-op day #2 with assistance VTE prophylaxis: Xarelto. SCDs. Ambulation Pain control: PRN pain medications, preferring oral medications Follow - up plan: 1 week in office Contact information: Dr. Ophelia Charter, Noemi Chapel PA-C, After hours and holidays please check [email or web address] for group call information for [facility]  Dispo: SNF.  PT/OT recommending SNF. Patient and her husband agreeable to this. TOC following. Okay to discharge to SNF once bed is available.      Noemi Chapel, PA-C 09/24/2020

## 2020-09-25 DIAGNOSIS — E119 Type 2 diabetes mellitus without complications: Secondary | ICD-10-CM | POA: Diagnosis not present

## 2020-09-25 DIAGNOSIS — S42231A 3-part fracture of surgical neck of right humerus, initial encounter for closed fracture: Secondary | ICD-10-CM | POA: Diagnosis not present

## 2020-09-25 DIAGNOSIS — Z20822 Contact with and (suspected) exposure to covid-19: Secondary | ICD-10-CM | POA: Diagnosis not present

## 2020-09-25 DIAGNOSIS — Z7984 Long term (current) use of oral hypoglycemic drugs: Secondary | ICD-10-CM | POA: Diagnosis not present

## 2020-09-25 DIAGNOSIS — J449 Chronic obstructive pulmonary disease, unspecified: Secondary | ICD-10-CM | POA: Diagnosis not present

## 2020-09-25 DIAGNOSIS — Z853 Personal history of malignant neoplasm of breast: Secondary | ICD-10-CM | POA: Diagnosis not present

## 2020-09-25 DIAGNOSIS — I10 Essential (primary) hypertension: Secondary | ICD-10-CM | POA: Diagnosis not present

## 2020-09-25 DIAGNOSIS — Z79899 Other long term (current) drug therapy: Secondary | ICD-10-CM | POA: Diagnosis not present

## 2020-09-25 DIAGNOSIS — S42201A Unspecified fracture of upper end of right humerus, initial encounter for closed fracture: Secondary | ICD-10-CM | POA: Diagnosis not present

## 2020-09-25 DIAGNOSIS — W1830XA Fall on same level, unspecified, initial encounter: Secondary | ICD-10-CM | POA: Diagnosis not present

## 2020-09-25 LAB — CBC
HCT: 24.3 % — ABNORMAL LOW (ref 36.0–46.0)
Hemoglobin: 8.1 g/dL — ABNORMAL LOW (ref 12.0–15.0)
MCH: 26.9 pg (ref 26.0–34.0)
MCHC: 33.3 g/dL (ref 30.0–36.0)
MCV: 80.7 fL (ref 80.0–100.0)
Platelets: 242 10*3/uL (ref 150–400)
RBC: 3.01 MIL/uL — ABNORMAL LOW (ref 3.87–5.11)
RDW: 16.1 % — ABNORMAL HIGH (ref 11.5–15.5)
WBC: 8.4 10*3/uL (ref 4.0–10.5)
nRBC: 0 % (ref 0.0–0.2)

## 2020-09-25 LAB — GLUCOSE, CAPILLARY
Glucose-Capillary: 114 mg/dL — ABNORMAL HIGH (ref 70–99)
Glucose-Capillary: 205 mg/dL — ABNORMAL HIGH (ref 70–99)
Glucose-Capillary: 110 mg/dL — ABNORMAL HIGH (ref 70–99)
Glucose-Capillary: 163 mg/dL — ABNORMAL HIGH (ref 70–99)

## 2020-09-25 LAB — SARS CORONAVIRUS 2 (TAT 6-24 HRS): SARS Coronavirus 2: NEGATIVE

## 2020-09-25 MED ORDER — LACTATED RINGERS IV BOLUS
500.0000 mL | Freq: Once | INTRAVENOUS | Status: AC
  Administered 2020-09-25: 500 mL via INTRAVENOUS

## 2020-09-25 NOTE — TOC Progression Note (Signed)
Transition of Care [facility]) - Progression Note    Patient Details  Name: Doris Jackson MRN: [id number] Date of Birth: 1940/11/21  Transition of Care [facility]) CM/SW Contact  Lennart Pall, LCSW Phone Number: 09/25/2020, 11:35 AM  Clinical Narrative:    Have confirmed with [facility] that the SNF authorization is still pending.   Expected Discharge Plan: Skilled Nursing Facility Barriers to Discharge: Insurance Authorization  Expected Discharge Plan and Services Expected Discharge Plan: [place] In-house Referral: Clinical Social Work     Living arrangements for the past 2 months: Single Family Home                                       Social Determinants of Health (SDOH) Interventions    Readmission Risk Interventions Readmission Risk Prevention Plan 09/16/2019  Transportation Screening Complete  PCP or Specialist Appt within 3-5 Days Complete  HRI or [place] Complete  Social Work Consult for [place] Planning/Counseling Complete  Palliative Care Screening Not Applicable  Medication Review [employer]) Complete  Some recent data might be hidden

## 2020-09-25 NOTE — Telephone Encounter (Signed)
Spoke with the pts husband and informed him a prescription is not needed for a BP and advised he contact [facility] at [phone]  to see if they have a size available for himself and the pt.

## 2020-09-25 NOTE — Progress Notes (Signed)
Inpatient Diabetes Program Recommendations  AACE/ADA: New Consensus Statement on Inpatient Glycemic Control (2015)  Target Ranges:  Prepandial:   less than 140 mg/dL      Peak postprandial:   less than 180 mg/dL (1-2 hours)      Critically ill patients:  140 - 180 mg/dL   Lab Results  Component Value Date   GLUCAP 205 (H) 09/25/2020   HGBA1C 6.6 (H) 09/19/2020    Review of Glycemic Control Results for CYNARA, TATHAM (MRN [id number]) as of 09/25/2020 11:50  Ref. Range 09/24/2020 11:22 09/24/2020 16:39 09/24/2020 20:14 09/25/2020 07:43 09/25/2020 11:43  Glucose-Capillary Latest Ref Range: 70 - 99 mg/dL 129 (H) 94 155 (H) 114 (H) 205 (H)   Diabetes history: DM 2 Outpatient Diabetes medications:  Glucotrol XL 5 mg daily, Metformin XR 500 mg bid, Actos 45 mg daily Current orders for Inpatient glycemic control:  Novolog resistant tid with meals and HS Tradjenta 5 mg daily Actos 45 mg daily Glipzide XL 5 mg daily Metformin XL 500 mg bid Inpatient Diabetes Program Recommendations:    Agree with current orders.  Should be okay to discharge patient home on previous home DM regimen.  A1C indicates great control of DM prior to admit.    Thanks  Adah Perl, RN, BC-ADM Inpatient Diabetes Coordinator Pager [phone] (8a-5p)

## 2020-09-25 NOTE — Progress Notes (Signed)
   ORTHOPAEDIC PROGRESS NOTE  s/p Procedure(s): REVERSE SHOULDER ARTHROPLASTY for fracture on 09/19/2020 with Dr. Griffin Basil  SUBJECTIVE: Reports some pain in her right arm this morning. Anxious with removal of dressing. Nervous ambulating because she is afraid she is going to fall. No abdominal pain. Tolerating diet. Voiding well. Had BM. No chest pain. No SOB. No nausea/vomiting. No other complaints. Still waiting on SNF placement.   OBJECTIVE: PE: General: sitting up in hospital bed, NAD Cardiac: regular rate Respiratory: no increased work of breathing GI: abdomen soft, non-tender Right upper extremity: Incision CDI. New steri-strips place. sling well fitting. + Motor in  AIN, PIN, Ulnar distributions. Axillary nerve sensation preserved.  Sensation intact in medial, radial, and ulnar distributions. Well perfused digits.    Vitals:   09/25/20 0522 09/25/20 1406  BP: (!) 143/53 122/62  Pulse: 99 97  Resp: 18 16  Temp: 98.2 F (36.8 C) 98 F (36.7 C)  SpO2: 97% 95%   Stable post-op imaging.   ASSESSMENT: Doris Jackson is a 80 y.o. female doing well postoperatively. POD#6  PLAN: Weightbearing: NWB RUE Insicional and dressing care: New steri-strips placed, leave in place until they fall off on their own, keep incision clean and dry Orthopedic device(s): Sling Showering: Post-op day #2 with assistance VTE prophylaxis: Xarelto. SCDs. Ambulation ABLA: Hgb improving. 8.1 today. Continue to monitor Hypotension: Blood pressure improving after bolus. Continue to monitor.  Diabetes: Glucose levels running from 90s-200s. Inpatient diabetes coordinator, Adah Perl RN BC-ADM, agree with current orders.  Acute on chronic kidney disease: Avoid NSAIDs. Continue fluids.  Pain control: PRN pain medications, preferring oral medications Follow - up plan: 3 weeks in office Contact information: Dr. Ophelia Charter, Noemi Chapel PA-C, After hours and holidays please check [email or web address] for group call  information for [facility]  Dispo: SNF.  PT/OT recommending SNF. Patient and her husband agreeable to this. TOC following. Okay to discharge to SNF once bed is available. SNF authorization still pending. Updated COVID test placed in anticipation for discharge.     Noemi Chapel, PA-C 09/25/2020

## 2020-09-25 NOTE — Plan of Care (Signed)
Plan of care discussed with pt.

## 2020-09-25 NOTE — Telephone Encounter (Signed)
Patient's husband is returning a call from [place].  Please advise.

## 2020-09-25 NOTE — Progress Notes (Signed)
Occupational Therapy Treatment Patient Details Name: LEON GOODNOW MRN: [id number] DOB: 1941-05-13 Today's Date: 09/25/2020    History of present illness Patient is a 80 year old female admitted 3/23 for R reverse total shoulder arthroplasty due to fall on 3/17 resulting in proximal humerus fracture. PMH includes HTN, HLD, GERD, DM2, COPD, anterior cervical disectomy   OT comments  Patient needing mod A and increased time to complete bed mobility with cues to use L UE on bed rail to assist with trunk, however patient perseverates on guarding R UE with L arm. Patient improved with controlling walker with L UE only, min A to safely transfer to toilet. With increased time patient able to complete cleaning buttock while seated on toilet, needing assist to wash peri area in standing after voiding/BM. Patient reporting dizziness with ambulation from bathroom to recliner, BP taken once seated on L LE reading 177/80, RN aware. Patient continues to need consistent supervision and up to min A for functional ambulation as well as increased assist with UB/LB ADLs. Continue to recommend ST rehab before D/C home.    Follow Up Recommendations  SNF    Equipment Recommendations  Other (comment) (bariatric BSC)       Precautions / Restrictions Precautions Precautions: Fall;Shoulder Type of Shoulder Precautions: AROM at elbow, wrist, hand ok. A/PROM shoulder NO Shoulder Interventions: Shoulder sling/immobilizer;Off for dressing/bathing/exercises Precaution Booklet Issued: Yes (comment) Required Braces or Orthoses: Sling Restrictions Weight Bearing Restrictions: Yes RUE Weight Bearing: Non weight bearing       Mobility Bed Mobility Overal bed mobility: Needs Assistance Bed Mobility: Supine to Sit     Supine to sit: HOB elevated;Mod assist     General bed mobility comments: patient needing assist to bring LEs and pivot hips with bed pad, cues to use L UE on bed rail to assist with trunk. needs  increased time    Transfers Overall transfer level: Needs assistance Equipment used: Rolling walker (2 wheeled) Transfers: Sit to/from Stand Sit to Stand: Min assist         General transfer comment: please see toilet transfer in ADL section; verbal cues for body mechanics during sit <> stand    Balance Overall balance assessment: History of Falls;Needs assistance Sitting-balance support: Feet supported Sitting balance-Leahy Scale: Fair     Standing balance support: Single extremity supported Standing balance-Leahy Scale: Poor Standing balance comment: reliant on UE                           ADL either performed or assessed with clinical judgement   ADL Overall ADL's : Needs assistance/impaired     Grooming: Wash/dry hands;Set up;Sitting                   Toilet Transfer: Minimal assistance;Ambulation;RW (bari commode over toilet) Toilet Transfer Details (indicate cue type and reason): cues to push with RLUE to stand, patient guarding R UE with L therefore tightened sling for more support. patient improved with ability to maneuver walker with L UE only Toileting- Clothing Manipulation and Hygiene: Sitting/lateral lean;Sit to/from stand;Moderate assistance Toileting - Clothing Manipulation Details (indicate cue type and reason): with increased time patient able to wash buttock after bowel movement, needing assist to wash peri area in standing     Functional mobility during ADLs: Minimal assistance;Rolling walker;Cueing for safety;Cueing for sequencing General ADL Comments: patient reports feeling dizzy with ambulation from bathroom to recliner. BP taken 177/80 with RN made aware  Cognition Arousal/Alertness: Awake/alert Behavior During Therapy: WFL for tasks assessed/performed Overall Cognitive Status: Within Functional Limits for tasks assessed                                 General Comments: tangential at times                    Pertinent Vitals/ Pain       Pain Assessment: Faces Faces Pain Scale: Hurts even more Pain Location: R shoulder Pain Descriptors / Indicators: Guarding;Discomfort Pain Intervention(s): RN gave pain meds during session         Frequency  Min 2X/week        Progress Toward Goals  OT Goals(current goals can now be found in the care plan section)  Progress towards OT goals: Progressing toward goals  Acute Rehab OT Goals Patient Stated Goal: care for myself OT Goal Formulation: With patient Time For Goal Achievement: 10/04/20 Potential to Achieve Goals: Good ADL Goals Pt Will Perform Upper Body Dressing: with min assist;sitting Pt Will Perform Lower Body Dressing: with min assist;sit to/from stand;sitting/lateral leans Pt Will Transfer to Toilet: ambulating;bedside commode;with min guard assist (walker) Pt Will Perform Toileting - Clothing Manipulation and hygiene: with min assist;sitting/lateral leans;sit to/from stand  Plan Discharge plan remains appropriate       AM-PAC OT "6 Clicks" Daily Activity     Outcome Measure   Help from another person eating meals?: None Help from another person taking care of personal grooming?: A Little Help from another person toileting, which includes using toliet, bedpan, or urinal?: A Lot Help from another person bathing (including washing, rinsing, drying)?: A Lot Help from another person to put on and taking off regular upper body clothing?: A Lot Help from another person to put on and taking off regular lower body clothing?: Total 6 Click Score: 14    End of Session Equipment Utilized During Treatment: Gait belt;Rolling walker;Other (comment) (sling)  OT Visit Diagnosis: Unsteadiness on feet (R26.81);History of falling (Z91.81);Pain Pain - Right/Left: Right Pain - part of body: Shoulder   Activity Tolerance Patient tolerated treatment well   Patient Left in chair;with call bell/phone within reach;with chair  alarm set;with nursing/sitter in room   Nurse Communication Mobility status        Time: 8309-4076 OT Time Calculation (min): 45 min  Charges: OT General Charges $OT Visit: 1 Visit OT Treatments $Self Care/Home Management : 38-52 mins  Delbert Phenix OT OT pager: [phone]   Rosemary Holms 09/25/2020, 11:02 AM

## 2020-09-26 ENCOUNTER — Telehealth: Payer: Self-pay | Admitting: Family Medicine

## 2020-09-26 DIAGNOSIS — Z20822 Contact with and (suspected) exposure to covid-19: Secondary | ICD-10-CM | POA: Diagnosis not present

## 2020-09-26 DIAGNOSIS — J449 Chronic obstructive pulmonary disease, unspecified: Secondary | ICD-10-CM | POA: Diagnosis not present

## 2020-09-26 DIAGNOSIS — S42231A 3-part fracture of surgical neck of right humerus, initial encounter for closed fracture: Secondary | ICD-10-CM | POA: Diagnosis not present

## 2020-09-26 DIAGNOSIS — I10 Essential (primary) hypertension: Secondary | ICD-10-CM | POA: Diagnosis not present

## 2020-09-26 DIAGNOSIS — Z79899 Other long term (current) drug therapy: Secondary | ICD-10-CM | POA: Diagnosis not present

## 2020-09-26 DIAGNOSIS — E119 Type 2 diabetes mellitus without complications: Secondary | ICD-10-CM | POA: Diagnosis not present

## 2020-09-26 DIAGNOSIS — S42201A Unspecified fracture of upper end of right humerus, initial encounter for closed fracture: Secondary | ICD-10-CM | POA: Diagnosis not present

## 2020-09-26 DIAGNOSIS — Z853 Personal history of malignant neoplasm of breast: Secondary | ICD-10-CM | POA: Diagnosis not present

## 2020-09-26 DIAGNOSIS — W1830XA Fall on same level, unspecified, initial encounter: Secondary | ICD-10-CM | POA: Diagnosis not present

## 2020-09-26 DIAGNOSIS — Z7984 Long term (current) use of oral hypoglycemic drugs: Secondary | ICD-10-CM | POA: Diagnosis not present

## 2020-09-26 LAB — GLUCOSE, CAPILLARY
Glucose-Capillary: 121 mg/dL — ABNORMAL HIGH (ref 70–99)
Glucose-Capillary: 167 mg/dL — ABNORMAL HIGH (ref 70–99)
Glucose-Capillary: 134 mg/dL — ABNORMAL HIGH (ref 70–99)
Glucose-Capillary: 116 mg/dL — ABNORMAL HIGH (ref 70–99)

## 2020-09-26 MED ORDER — LACTATED RINGERS IV BOLUS
500.0000 mL | Freq: Once | INTRAVENOUS | Status: AC
  Administered 2020-09-26: 500 mL via INTRAVENOUS

## 2020-09-26 NOTE — Progress Notes (Signed)
   ORTHOPAEDIC PROGRESS NOTE  s/p Procedure(s): REVERSE SHOULDER ARTHROPLASTY for fracture on 09/19/2020 with Dr. Griffin Basil  SUBJECTIVE: Patient tearful today. Wanting to go home, but does not think her husband can take care of her. She thinks she is able to go home with assistance. States she thinks she is doing better with therapy.  No abdominal pain. Tolerating diet. Voiding well. Had BM. No chest pain. No SOB. No nausea/vomiting. No other complaints.   OBJECTIVE: PE: General: sitting up in hospital bed, NAD Cardiac: regular rate Respiratory: no increased work of breathing GI: abdomen soft, non-tender Right upper extremity: Steri-strips place. sling well fitting. + Motor in  AIN, PIN, Ulnar distributions. Axillary nerve sensation preserved.  Sensation intact in medial, radial, and ulnar distributions. Well perfused digits.    Vitals:   09/25/20 2028 09/26/20 0604  BP: (!) 132/51 125/65  Pulse: 97 93  Resp: 18 18  Temp: 97.6 F (36.4 C) 98.5 F (36.9 C)  SpO2: 100% 100%   Stable post-op imaging.   ASSESSMENT: Doris Jackson is a 80 y.o. female doing well postoperatively. POD#7  PLAN: Weightbearing: NWB RUE Insicional and dressing care: Leave steri-strips in place until they fall off on their own, keep incision clean and dry Orthopedic device(s): Sling Showering: Post-op day #2 with assistance VTE prophylaxis: Xarelto. SCDs. Ambulation ABLA: Hgb improving. 8.1 yesterday. Hemodynamically stable Hypotension: Blood pressure improving after bolus. Continue to monitor. BP stable this AM.  Diabetes: Glucose levels running from 90s-200s. Inpatient diabetes coordinator, Adah Perl RN BC-ADM, agree with current orders.  Acute on chronic kidney disease: Avoid NSAIDs. Continue fluids.  Pain control: PRN pain medications, preferring oral medications Follow - up plan: 3 weeks in office Contact information: Dr. Ophelia Charter, Noemi Chapel PA-C, After hours and holidays please check  [email or web address] for group call information for [facility]  Dispo: SNF.  PT/OT recommending SNF. Patient and her husband agreeable have been agreeable to this. TOC following. SNF authorization still pending. Updated COVID test placed in anticipation for discharge.   Patient now stating she does not want to go to SNF and would like to go home with home health, but she does not know if her husband would be able to help care for her. I will discuss this with PT/OT to get their opinion.   PT revaluation today - patient did well and recommending home health PT/OT. Discussed this with the husband. He would like to get a hospital bed for her. We will work on DME needs for discharge home, with a plan for discharge home tomorrow.     Noemi Chapel, PA-C 09/26/2020

## 2020-09-26 NOTE — Progress Notes (Signed)
Physical Therapy Treatment Patient Details Name: Doris Jackson MRN: [id number] DOB: [birth year] Today's Date: 09/26/2020    History of Present Illness Patient is a 80 year old female admitted 3/23 for R reverse total shoulder arthroplasty due to fall on 3/17 resulting in proximal humerus fracture. PMH includes HTN, HLD, GERD, DM2, COPD, anterior cervical disectomy    PT Comments    Pt is progressing, she continues to be anxious regarding mobility/fearful of falls and requires maximum encouragement to mobilize with PT.  However pt t is min/guard overall for mobility, was able to amb ~ 35' with quad cane and min-guard, no LOB. Pt has QC at home, states her husband will assist with meals and be able to provide min-guard for household amb. Recommend HHPT, HHOT at d/c. Pt reports she strongly desires to go home and does not want to go to SNF.   Follow Up Recommendations  HHPT, HHOT     Equipment Recommendations  None recommended by PT    Recommendations for Other Services       Precautions / Restrictions Precautions Precautions: Fall;Shoulder Type of Shoulder Precautions: AROM at elbow, wrist, hand ok. A/PROM shoulder NO Shoulder Interventions: Shoulder sling/immobilizer;Off for dressing/bathing/exercises Required Braces or Orthoses: Sling Restrictions RUE Weight Bearing: Non weight bearing    Mobility  Bed Mobility   Bed Mobility: Supine to Sit     Supine to sit: HOB elevated;Min guard     General bed mobility comments: effortful transition with incr time and cues to self assist, min/guard for trunk however no actual physical assist, heavy encouragement needed for task completion    Transfers Overall transfer level: Needs assistance Equipment used: Rolling walker (2 wheeled);Quad cane Transfers: Sit to/from Stand Sit to Stand: [employer] transfer comment: cues for hand placement, to power up with LEs  Ambulation/Gait Ambulation/Gait assistance: Min  guard;Supervision Gait Distance (Feet): 35 Feet Assistive device: Rolling walker (2 wheeled);Quad cane Gait Pattern/deviations: Step-through pattern;Decreased stride length Gait velocity: decr   General Gait Details: Pt ambulated with one hand on RW and light intermittent assist  from PT to manage RW, switched to quad cane after pt agreeable. Distance ltd by fatigue. Pt is fearful of falling and requires encouragement to increase ambulation distance. fatigued and tearful however gait steady, no overt LOB, chair to pt d/t anxiety   Stairs             Wheelchair Mobility    Modified Rankin (Stroke Patients Only)       Balance     Sitting balance-Leahy Scale: Fair     Standing balance support: Single extremity supported   Standing balance comment: reliant on UE, able to briefly maintain static stand without UE support, wide BOS                            Cognition Arousal/Alertness: Awake/alert Behavior During Therapy: WFL for tasks assessed/performed;Anxious Overall Cognitive Status: Within Functional Limits for tasks assessed                                 General Comments: tangential at times, requires redirection to task      Exercises      General Comments        Pertinent Vitals/Pain Pain Assessment: 0-10 Pain Score: 4  Pain Location: R shoulder Pain Descriptors / Indicators:  Guarding;Discomfort;Sore Pain Intervention(s): Limited activity within patient's tolerance;Monitored during session;Premedicated before session;Repositioned    Home Living                      Prior Function            PT Goals (current goals can now be found in the care plan section) Acute Rehab PT Goals Patient Stated Goal: care for myself PT Goal Formulation: With patient Time For Goal Achievement: 09/27/20 Potential to Achieve Goals: Fair Progress towards PT goals: Progressing toward goals    Frequency    Min 3X/week       PT Plan Frequency needs to be updated    Co-evaluation              AM-PAC PT "6 Clicks" Mobility   Outcome Measure  Help needed turning from your back to your side while in a flat bed without using bedrails?: A Lot Help needed moving from lying on your back to sitting on the side of a flat bed without using bedrails?: A Little Help needed moving to and from a bed to a chair (including a wheelchair)?: A Little Help needed standing up from a chair using your arms (e.g., wheelchair or bedside chair)?: A Little Help needed to walk in hospital room?: A Little Help needed climbing 3-5 steps with a railing? : A Lot 6 Click Score: 16    End of Session Equipment Utilized During Treatment: Gait belt;Other (comment) (sling) Activity Tolerance: Patient tolerated treatment well Patient left: in chair;with call bell/phone within reach;with chair alarm set;with nursing/sitter in room Nurse Communication: Mobility status PT Visit Diagnosis: Unsteadiness on feet (R26.81);Muscle weakness (generalized) (M62.81);History of falling (Z91.81);Difficulty in walking, not elsewhere classified (R26.2)     Time: 2725-3664 PT Time Calculation (min) (ACUTE ONLY): 33 min  Charges:  $Gait Training: 8-22 mins $Therapeutic Activity: 8-22 mins                     Baxter Flattery, PT  Acute Rehab Dept ([place]) [phone] Pager [phone]  09/26/2020    [facility] 09/26/2020, 2:44 PM

## 2020-09-26 NOTE — Telephone Encounter (Signed)
Called patient. No answer. Left message to call if she needs anything. She debated home versus SNF and looks like SNF was what was recommended for her.

## 2020-09-26 NOTE — Telephone Encounter (Signed)
Patient is calling and wanted to let the provider know that she was in the hospital due to a fall and will be discharging to rehab facility. CB is [phone]

## 2020-09-26 NOTE — Plan of Care (Signed)
Plan of care discussed with pt.

## 2020-09-27 ENCOUNTER — Telehealth: Payer: Self-pay | Admitting: Pharmacist

## 2020-09-27 DIAGNOSIS — Z20822 Contact with and (suspected) exposure to covid-19: Secondary | ICD-10-CM | POA: Diagnosis not present

## 2020-09-27 DIAGNOSIS — I10 Essential (primary) hypertension: Secondary | ICD-10-CM | POA: Diagnosis not present

## 2020-09-27 DIAGNOSIS — Z79899 Other long term (current) drug therapy: Secondary | ICD-10-CM | POA: Diagnosis not present

## 2020-09-27 DIAGNOSIS — J449 Chronic obstructive pulmonary disease, unspecified: Secondary | ICD-10-CM | POA: Diagnosis not present

## 2020-09-27 DIAGNOSIS — W1830XA Fall on same level, unspecified, initial encounter: Secondary | ICD-10-CM | POA: Diagnosis not present

## 2020-09-27 DIAGNOSIS — S42231A 3-part fracture of surgical neck of right humerus, initial encounter for closed fracture: Secondary | ICD-10-CM | POA: Diagnosis not present

## 2020-09-27 DIAGNOSIS — S42201A Unspecified fracture of upper end of right humerus, initial encounter for closed fracture: Secondary | ICD-10-CM | POA: Diagnosis not present

## 2020-09-27 DIAGNOSIS — Z853 Personal history of malignant neoplasm of breast: Secondary | ICD-10-CM | POA: Diagnosis not present

## 2020-09-27 DIAGNOSIS — E119 Type 2 diabetes mellitus without complications: Secondary | ICD-10-CM | POA: Diagnosis not present

## 2020-09-27 DIAGNOSIS — Z7984 Long term (current) use of oral hypoglycemic drugs: Secondary | ICD-10-CM | POA: Diagnosis not present

## 2020-09-27 DIAGNOSIS — M4802 Spinal stenosis, cervical region: Secondary | ICD-10-CM | POA: Diagnosis not present

## 2020-09-27 LAB — GLUCOSE, CAPILLARY
Glucose-Capillary: 61 mg/dL — ABNORMAL LOW (ref 70–99)
Glucose-Capillary: 138 mg/dL — ABNORMAL HIGH (ref 70–99)

## 2020-09-27 MED ORDER — SPIRONOLACTONE 12.5 MG HALF TABLET
12.5000 mg | ORAL_TABLET | Freq: Every day | ORAL | Status: DC
  Filled 2020-09-27: qty 1

## 2020-09-27 MED ORDER — FAMOTIDINE 20 MG PO TABS: 20.0000 mg | ORAL_TABLET | Freq: Every day | ORAL | 0 refills | Status: DC

## 2020-09-27 MED ORDER — METFORMIN HCL ER 500 MG PO TB24: 500.0000 mg | ORAL_TABLET | Freq: Two times a day (BID) | ORAL | 0 refills | Status: DC

## 2020-09-27 MED ORDER — SPIRONOLACTONE 25 MG PO TABS: 12.5000 mg | ORAL_TABLET | Freq: Every day | ORAL | 0 refills | Status: DC

## 2020-09-27 MED ORDER — PIOGLITAZONE HCL 30 MG PO TABS
45.0000 mg | ORAL_TABLET | Freq: Every day | ORAL | Status: DC
  Filled 2020-09-27: qty 1

## 2020-09-27 MED ORDER — METFORMIN HCL ER 500 MG PO TB24: 500.0000 mg | ORAL_TABLET | Freq: Two times a day (BID) | ORAL | Status: DC

## 2020-09-27 MED ORDER — OXYCODONE HCL 5 MG PO TABS: ORAL_TABLET | ORAL | 0 refills | Status: DC

## 2020-09-27 MED ORDER — AMLODIPINE BESYLATE 10 MG PO TABS: 10.0000 mg | ORAL_TABLET | Freq: Every day | ORAL | Status: DC

## 2020-09-27 MED ORDER — ACETAMINOPHEN 500 MG PO TABS: 1000.0000 mg | ORAL_TABLET | Freq: Three times a day (TID) | ORAL | 0 refills | Status: AC

## 2020-09-27 MED ORDER — GLIPIZIDE ER 5 MG PO TB24: 5.0000 mg | ORAL_TABLET | Freq: Every day | ORAL | 0 refills | Status: DC

## 2020-09-27 MED ORDER — POTASSIUM CHLORIDE ER 10 MEQ PO CPCR: ORAL_CAPSULE | ORAL | 0 refills | Status: DC

## 2020-09-27 MED ORDER — PREDNISOLONE ACETATE 1 % OP SUSP: 1.0000 [drp] | OPHTHALMIC | Status: DC

## 2020-09-27 MED ORDER — OXYCODONE HCL 5 MG PO TABS
5.0000 mg | ORAL_TABLET | Freq: Three times a day (TID) | ORAL | Status: DC | PRN
  Administered 2020-09-27: 5 mg via ORAL

## 2020-09-27 MED ORDER — FAMOTIDINE 20 MG PO TABS: 20.0000 mg | ORAL_TABLET | Freq: Every day | ORAL | 1 refills | Status: DC

## 2020-09-27 MED ORDER — HYDROCHLOROTHIAZIDE 25 MG PO TABS: 12.5000 mg | ORAL_TABLET | Freq: Every day | ORAL | Status: DC

## 2020-09-27 MED ORDER — RIVAROXABAN 10 MG PO TABS: 10.0000 mg | ORAL_TABLET | Freq: Every day | ORAL | 0 refills | Status: DC

## 2020-09-27 MED ORDER — PIOGLITAZONE HCL 45 MG PO TABS
45.0000 mg | ORAL_TABLET | Freq: Every day | ORAL | Status: DC
  Filled 2020-09-27: qty 1

## 2020-09-27 NOTE — Progress Notes (Addendum)
   ORTHOPAEDIC PROGRESS NOTE  s/p Procedure(s): REVERSE SHOULDER ARTHROPLASTY for fracture on 09/19/2020 with Dr. Griffin Basil  SUBJECTIVE: Patient wanting to go home and not SNF. Pain controlled. Improving with therapy.  No abdominal pain. Tolerating diet. Voiding well. Had BM. No chest pain. No SOB. No nausea/vomiting. No other complaints.   OBJECTIVE: PE: General: resting in hospital bed, NAD Cardiac: regular rate Respiratory: no increased work of breathing GI: abdomen soft, non-tender Right upper extremity: Steri-strips place. sling well fitting. + Motor in  AIN, PIN, Ulnar distributions. Axillary nerve sensation preserved.  Sensation intact in medial, radial, and ulnar distributions. Well perfused digits.    Vitals:   09/26/20 2104 09/27/20 0429  BP: (!) 146/48 129/71  Pulse: 83 88  Resp: 18 16  Temp: 97.8 F (36.6 C) 97.8 F (36.6 C)  SpO2: 96% 100%   Stable post-op imaging.   ASSESSMENT: Doris Jackson is a 80 y.o. female doing well postoperatively. POD#8  PLAN: Weightbearing: NWB RUE Insicional and dressing care: Leave steri-strips in place until they fall off on their own, keep incision clean and dry Orthopedic device(s): Sling Showering: Post-op day #2 with assistance VTE prophylaxis: Xarelto. SCDs. Ambulation ABLA: Hemodynamically stable Hypotension: Blood pressure improving after bolus. Continue to monitor. BP stable this AM.  Diabetes: Glucose levels running from 90s-200s. Inpatient diabetes coordinator, Adah Perl RN BC-ADM, agree with current orders. Glucose 61 this morning.   Acute on chronic kidney disease: Avoid NSAIDs. Continue fluids.  Pain control: PRN pain medications, preferring oral medications Follow - up plan: 3 weeks in office Contact information: Dr. Ophelia Charter, Noemi Chapel PA-C, After hours and holidays please check [email or web address] for group call information for [facility]  Dispo: SNF.  PT/OT recommending HHPT/HHOT. Patient has improved with  therapy over the past week. Medically stable for discharge. Post-op pain controlled. Discharge home later today with husband. Will get DME needs and home health set up for her. TOC following and updated on plan.     Noemi Chapel, PA-C 09/27/2020

## 2020-09-27 NOTE — Plan of Care (Signed)
Patient discharged home in stable condition, waiting on husband to pick her up

## 2020-09-27 NOTE — Discharge Instructions (Signed)
Ophelia Charter MD, MPH Noemi Chapel, PA-C [place] [address] [phone] (tel)   [phone] (fax)   [facility] REPLACEMENT    WOUND CARE ? Leave steri-strips in place until they fall off on their own o Usually 1-2 weeks after surgery ? After they fall off, it is okay to leave incision clean and open to air ? KEEP THE INCISIONS CLEAN AND DRY. ? Use the provided ice machine or Ice packs as often as possible as needed for pain relief.   o Keep a layer of cloth or a shirt between your skin and the cooling unit to prevent frost bite as it can get very cold.  SHOWERING: - You may shower with assistance - Steri-strips can get wet, but pat them dry after showering - You may remove the sling for showering, but keep a water resistant pillow under the arm to keep both the  elbow and shoulder away from the body (mimicking the abduction sling).  - Gently pat the area dry.  - Do not soak the shoulder in water. - Do not go swimming in the pool or ocean - [place].  EXERCISES ? Wear the sling at all times except when doing your exercises.  ? You may remove the sling for showering, but keep the arm across the chest or in a secondary sling.    ? Accidental/Purposeful External Rotation and shoulder flexion (reaching behind you) is to be avoided at all costs for the first month. ? It is ok to come out of your sling if your are sitting and have assistance for eating.   ? Do not lift anything heavier than 1 pound until we discuss it further in clinic. ? Please perform the exercises:(as taught by the physical and occupational therapist)   . Elbow / Hand / Wrist  Range of Motion Exercises . Grip strengthening   REGIONAL ANESTHESIA (NERVE BLOCKS) . The anesthesia team may have performed a nerve block for you if safe in the setting of your care.  This is a great tool used to minimize pain.  Typically the  block may start wearing off overnight but the long acting medicine may last for 3-4 days.  The nerve block wearing off can be a challenging period but please utilize your as needed pain medications to try and manage this period.    POST-OP MEDICATIONS- Multimodal approach to pain control . In general your pain will be controlled with a combination of substances.  Prescriptions unless otherwise discussed are electronically sent to your pharmacy.  This is a carefully made plan we use to minimize narcotic use.     ? Acetaminophen - Non-narcotic pain medicine taken on a scheduled basis  ? Oxycodone - This is a strong narcotic, to be used only on an "as needed" basis for pain. - Begin to wean off of your pain medication as soon as you are able ? Xarelto - This medicine is used to minimize the risk of blood clots after surgery.    FOLLOW-UP ? If you develop a Fever (>101.5), Redness or Drainage from the surgical incision site, please call our office to arrange for an evaluation. ? Please call the office to schedule a follow-up appointment 3 weeks after discharge from the hospital  IF [place], PLEASE FEEL FREE TO CALL OUR OFFICE.  HELPFUL INFORMATION  . If you had a block, it will wear off between 8-24 hrs  postop typically.  This is period when your pain may go from nearly zero to the pain you would have had post-op without the block.  This is an abrupt transition but nothing dangerous is happening.  You may take an extra dose of narcotic when this happens.  Your arm will be in a sling following surgery. You will be in this sling for the next 4 weeks.  ? You may be more comfortable sleeping in a semi-seated position the first few nights following surgery.  Keep a pillow propped under the elbow and forearm for comfort.  If you have a recliner type of chair it might be beneficial.  If not that is fine too, but it would be helpful to sleep propped up with pillows behind your operated  shoulder as well under your elbow and forearm.  This will reduce pulling on the suture lines.  ? When dressing, put your operative arm in the sleeve first.  When getting undressed, take your operative arm out last.  Loose fitting, button-down shirts are recommended.  ? In most states it is against the law to drive while your arm is in a sling. And certainly against the law to drive while taking narcotics.  ? You may return to work/school in the next couple of days when you feel up to it. Desk work and typing in the sling is fine.  ? We suggest you use the pain medication the first night prior to going to bed, in order to ease any pain when the anesthesia wears off. You should avoid taking pain medications on an empty stomach as it will make you nauseous.  ? Do not drink alcoholic beverages or take illicit drugs when taking pain medications.  ? Pain medication may make you constipated.  Below are a few solutions to try in this order: - Decrease the amount of pain medication if you aren't having pain. - Drink lots of decaffeinated fluids. - Drink prune juice and/or each dried prunes  o If the first 3 don't work start with additional solutions - Take Colace - an over-the-counter stool softener - Take Senokot - an over-the-counter laxative - Take Miralax - a stronger over-the-counter laxative   Dental Antibiotics:  In most cases prophylactic antibiotics for Dental procdeures after total joint surgery are not necessary.  Exceptions are as follows:  1. History of prior total joint infection  2. Severely immunocompromised (Organ Transplant, cancer chemotherapy, Rheumatoid biologic meds such as [place])  3. Poorly controlled diabetes (A1C &gt; 8.0, blood glucose over 200)  If you have one of these conditions, contact your surgeon for an antibiotic prescription, prior to your dental procedure.   For more information including helpful videos and documents visit our website:    [email or web address]   Information on my medicine - XARELTO (Rivaroxaban)  Why was Xarelto prescribed for you? Xarelto was prescribed for you to reduce the risk of blood clots forming after orthopedic surgery. The medical term for these abnormal blood clots is venous thromboembolism (VTE).  What do you need to know about xarelto ? Take your Xarelto ONCE DAILY at the same time every day. You may take it either with or without food.  If you have difficulty swallowing the tablet whole, you may crush it and mix in applesauce just prior to taking your dose.  Take Xarelto exactly as prescribed by your doctor and DO NOT stop taking Xarelto without talking to the doctor who prescribed the medication.  Stopping without other VTE prevention medication  to take the place of Xarelto may increase your risk of developing a clot.  After discharge, you should have regular check-up appointments with your healthcare provider that is prescribing your Xarelto.    What do you do if you miss a dose? If you miss a dose, take it as soon as you remember on the same day then continue your regularly scheduled once daily regimen the next day. Do not take two doses of Xarelto on the same day.   Important Safety Information A possible side effect of Xarelto is bleeding. You should call your healthcare provider right away if you experience any of the following: ? Bleeding from an injury or your nose that does not stop. ? Unusual colored urine (red or dark brown) or unusual colored stools (red or black). ? Unusual bruising for unknown reasons. ? A serious fall or if you hit your head (even if there is no bleeding).  Some medicines may interact with Xarelto and might increase your risk of bleeding while on Xarelto. To help avoid this, consult your healthcare provider or pharmacist prior to using any new prescription or non-prescription medications, including herbals,  vitamins, non-steroidal anti-inflammatory drugs (NSAIDs) and supplements.  This website has more information on Xarelto: [email or web address]

## 2020-09-27 NOTE — TOC Transition Note (Signed)
Transition of Care [facility]) - CM/SW Discharge Note   Patient Details  Name: Doris Jackson MRN: [id number] Date of Birth: Apr 19, 1941  [facility] CM/SW Contact:  Lennart Pall, LCSW Phone Number: 09/27/2020, 11:25 AM   Clinical Narrative:    Have confirmed with pt and spouse that they now wish to change dc back to home and cancel plan for SNF (STILL have not received insurance auth).   They are requesting hospital bed for home - referral made to [facility].  Pt already has quad cane at home for mobility.  Have placed referral for HHPT/OT with [facility] ([facility]).  Spouse plans to pick up patient later this afternoon.  No further TOC needs.   Final next level of care: [place] Barriers to Discharge: Barriers Resolved   Patient Goals and CMS Choice Patient states their goals for this hospitalization and ongoing recovery are:: to go home with "somebody to help me"      Discharge Placement                       Discharge Plan and Services In-house Referral: Clinical Social Work              DME Arranged: Hospital bed DME Agency: [employer] Date DME Agency Contacted: 09/27/20 Time DME Agency Contacted: 1030 Representative spoke with at DME Agency: Freda Munro [place]: PT,OT [place] Agency: [facility] [employer] Date [place]: 09/27/20 Time [place]: 1030 Representative spoke with at [place]: Theotis Barrio.  Social Determinants of Health (SDOH) Interventions     Readmission Risk Interventions Readmission Risk Prevention Plan 09/16/2019  Transportation Screening Complete  PCP or Specialist Appt within 3-5 Days Complete  HRI or [place] Complete  Social Work Consult for [place] Planning/Counseling Complete  Palliative Care Screening Not Applicable  Medication Review Press photographer) Complete  Some recent data might be hidden

## 2020-09-27 NOTE — Chronic Care Management (AMB) (Addendum)
I left the patient a message about her upcoming appointment on 09/28/2020 @ 9:30 am with the clinical pharmacist. She was asked to please have all medication on hand to review with the pharmacist.   Doris Jackson) [place], [place] [phone]  Called patient to reschedule appt for after PCP visit on 09/28/20.  [name], PharmD [facility] Clinical Pharmacist [place] at [place]

## 2020-09-27 NOTE — Progress Notes (Signed)
Physical Therapy Treatment Patient Details Name: Doris Jackson MRN: [id number] DOB: 1940-10-14 Today's Date: 09/27/2020    History of Present Illness Patient is a 80 year old female admitted 3/23 for R reverse total shoulder arthroplasty due to fall on 3/17 resulting in proximal humerus fracture. PMH includes HTN, HLD, GERD, DM2, COPD, anterior cervical disectomy    PT Comments    Pt in bed incont urine.  Assisted OOB to amb to bathroom.  General bed mobility comments: increased time to "scoot" self and self able with use of rail.  Struggled but able.  Pt stated she plans to sleep in a recliner at home. General transfer comment: pt insistant she uses a walker despite onlt functional use of one arm as other is in a sling.  "I feel more secure" with the walker.  Pt able to self rise to feet from elevated bed and only required assist to steay walker to prevent lateral tipping.  Pt also self able to rise from toilet using forward momentum and pulling on walker as therapist secrured. General Gait Details: pt agreed to amb but only with the walker due to fear of falling and instability with QC.  Pt did pretty well advancing walker with just one arm as R UE is in a sling.  Did well amb to and from bathroom as well as safe turns.  Also assisted with a wash up.  Carefully removed sling for hygiene then reapplied after new gown.  Pt left in recliner and positioned to comfort.   Pt plans to go home later today back home with spouse.     Follow Up Recommendations  Home health PT     Equipment Recommendations  None recommended by PT    Recommendations for Other Services       Precautions / Restrictions Precautions Precautions: Fall;Shoulder Type of Shoulder Precautions: AROM at elbow, wrist, hand ok. A/PROM shoulder NO Shoulder Interventions: Shoulder sling/immobilizer;Off for dressing/bathing/exercises Required Braces or Orthoses: Sling Restrictions Weight Bearing Restrictions: No RUE Weight  Bearing: Non weight bearing    Mobility  Bed Mobility Overal bed mobility: Needs Assistance Bed Mobility: Supine to Sit     Supine to sit: HOB elevated;Min guard     General bed mobility comments: increased time to "scoot" self and self able with use of rail.  Struggled but able.  Pt stated she plans to sleep in a recliner at home.    Transfers Overall transfer level: Needs assistance Equipment used: Rolling walker (2 wheeled);Quad cane Transfers: Sit to/from [employer] to Stand: Min guard Stand pivot transfers: Min guard       General transfer comment: pt insistant she uses a walker despite onlt functional use of one arm as other is in a sling.  "I feel more secure" with the walker.  Pt able to self rise to feet from elevated bed and only required assist to steay walker to prevent lateral tipping.  Pt also self able to rise from toilet using forward momentum and pulling on walker as therapist secrured.  Ambulation/Gait Ambulation/Gait assistance: Min Gaffer (Feet): 24 Feet Assistive device: Rolling walker (2 wheeled) Gait Pattern/deviations: Step-through pattern;Decreased stride length Gait velocity: decr   General Gait Details: pt agreed to amb but only with the walker due to fear of falling and instability with QC.  Pt did pretty well advancing walker with just one arm as R UE is in a sling.  Did well amb to and from bathroom as well  as safe turns.   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 [place] Comments        Pertinent Vitals/Pain Pain Assessment: Faces Faces Pain Scale: Hurts little more Pain Location: R shoulder Pain Descriptors / Indicators: Guarding;Discomfort;Sore Pain Intervention(s): Monitored during session;Premedicated before session;Repositioned;Ice applied    Home Living                      Prior Function            PT Goals (current goals can now be found in the care plan section) Progress towards PT goals: Progressing toward goals    Frequency    Min 3X/week      PT Plan Current plan remains appropriate    Co-evaluation              AM-PAC PT "6 Clicks" Mobility   Outcome Measure  Help needed turning from your back to your side while in a flat bed without using bedrails?: A Little Help needed moving from lying on your back to sitting on the side of a flat bed without using bedrails?: A Little Help needed moving to and from a bed to a chair (including a wheelchair)?: A Little Help needed standing up from a chair using your arms (e.g., wheelchair or bedside chair)?: A Little Help needed to walk in hospital room?: A Little Help needed climbing 3-5 steps with a railing? : A Little 6 Click Score: 18    End of Session Equipment Utilized During Treatment: Gait belt   Patient left: in chair;with call bell/phone within reach;with chair alarm set;with nursing/sitter in room Nurse Communication: Mobility status PT Visit Diagnosis: Unsteadiness on feet (R26.81);Muscle weakness (generalized) (M62.81);History of falling (Z91.81);Difficulty in walking, not elsewhere classified (R26.2)     Time: 9381-8299 PT Time Calculation (min) (ACUTE ONLY): 38 min  Charges:  $Gait Training: 8-22 mins $Therapeutic Exercise: 23-37 mins                     Rica Koyanagi  PTA Acute  Rehabilitation Services Pager      [phone] Office      [phone]

## 2020-09-28 ENCOUNTER — Telehealth: Payer: Medicare HMO

## 2020-09-28 DIAGNOSIS — I10 Essential (primary) hypertension: Secondary | ICD-10-CM | POA: Diagnosis not present

## 2020-09-28 DIAGNOSIS — Z7901 Long term (current) use of anticoagulants: Secondary | ICD-10-CM | POA: Diagnosis not present

## 2020-09-28 DIAGNOSIS — M1990 Unspecified osteoarthritis, unspecified site: Secondary | ICD-10-CM | POA: Diagnosis not present

## 2020-09-28 DIAGNOSIS — Z981 Arthrodesis status: Secondary | ICD-10-CM | POA: Diagnosis not present

## 2020-09-28 DIAGNOSIS — Z9181 History of falling: Secondary | ICD-10-CM | POA: Diagnosis not present

## 2020-09-28 DIAGNOSIS — S42201D Unspecified fracture of upper end of right humerus, subsequent encounter for fracture with routine healing: Secondary | ICD-10-CM | POA: Diagnosis not present

## 2020-09-28 DIAGNOSIS — E785 Hyperlipidemia, unspecified: Secondary | ICD-10-CM | POA: Diagnosis not present

## 2020-09-28 DIAGNOSIS — Z7984 Long term (current) use of oral hypoglycemic drugs: Secondary | ICD-10-CM | POA: Diagnosis not present

## 2020-09-28 DIAGNOSIS — Z4789 Encounter for other orthopedic aftercare: Secondary | ICD-10-CM | POA: Diagnosis not present

## 2020-09-28 DIAGNOSIS — Z7951 Long term (current) use of inhaled steroids: Secondary | ICD-10-CM | POA: Diagnosis not present

## 2020-09-28 DIAGNOSIS — J449 Chronic obstructive pulmonary disease, unspecified: Secondary | ICD-10-CM | POA: Diagnosis not present

## 2020-09-28 DIAGNOSIS — Z947 Corneal transplant status: Secondary | ICD-10-CM | POA: Diagnosis not present

## 2020-09-28 DIAGNOSIS — D649 Anemia, unspecified: Secondary | ICD-10-CM | POA: Diagnosis not present

## 2020-09-28 DIAGNOSIS — Z853 Personal history of malignant neoplasm of breast: Secondary | ICD-10-CM | POA: Diagnosis not present

## 2020-09-28 DIAGNOSIS — E119 Type 2 diabetes mellitus without complications: Secondary | ICD-10-CM | POA: Diagnosis not present

## 2020-09-28 DIAGNOSIS — Z96611 Presence of right artificial shoulder joint: Secondary | ICD-10-CM | POA: Diagnosis not present

## 2020-09-28 DIAGNOSIS — K219 Gastro-esophageal reflux disease without esophagitis: Secondary | ICD-10-CM | POA: Diagnosis not present

## 2020-09-28 NOTE — Discharge Summary (Signed)
Patient ID: Doris Jackson MRN: [id number] DOB/AGE: [birth year] 80 y.o.  Admit date: 09/19/2020 Discharge date: 09/28/2020  Admission Diagnoses: Right proximal humerus fracture three-part with significant displacement  Discharge Diagnoses:  Active Problems:   Status post reverse total arthroplasty of right shoulder   Past Medical History:  Diagnosis Date  . ALLERGIC RHINITIS 06/17/2010  . ANEMIA-NOS 06/17/2010  . BREAST CANCER, HX OF 06/17/2010  . Breast cancer, right ([place]) 2008  . Cataract   . COPD 06/17/2010   pt denies  . DIABETES MELLITUS, TYPE II 06/17/2010  . Endometriosis   . Fibroid    FIBROID  . GERD (gastroesophageal reflux disease)   . HYPERLIPIDEMIA 06/17/2010  . HYPERTENSION 06/17/2010  . Leg swelling   . LOW BACK PAIN 06/17/2010  . OSTEOARTHRITIS 06/17/2010  . OSTEOPENIA 06/17/2010  . Weakness    Procedures Performed: Right reverse total shoulder arthroplasty for fracture  Discharged Condition: good/stable  Hospital Course: Patient brought in to [facility] for schedule procedure. She tolerated procedure well.  She waas kept for monitoring overnight for pain control, medical monitoring postop, and discharge planning. PT/OT evaluated the patient and recommended SNF as patient was very unstable and had difficulty ambulating without use of her left arm. She stayed in the hospital while waiting for authorization for SNF. She continued to improve with therapy while in the hospital. Patient then felt comfortable going home with home health therapy. Both the patient and her husband were agreeable to home with home health. Patient no longer was interested in SNF and declined going. She was set up for home health therapies and all DME needs were provided. She was discharged home with her family on 09/27/2020. She was instructed to follow-up with her PCP for re-check after discharge. She was instructed on restrictions and plan for care after discharge. She was found to be  stable for DC home on 09/27/2020.  Patient was instructed on specific activity restrictions and all questions were answered.  Consults: None  Significant Diagnostic Studies: No additional pertinent studies  Treatments: Surgery  Discharge Exam: General: resting in hospital bed, NAD Cardiac: regular rate Respiratory: no increased work of breathing GI: abdomen soft, non-tender Right upper extremity: Steri-strips place. sling well fitting. + Motor in  AIN, PIN, Ulnar distributions. Axillary nerve sensation preserved.  Sensation intact in medial, radial, and ulnar distributions. Well perfused digits.   Disposition: Discharge disposition: 01-Home or Self Care      Post-op medications and wound care was reviewed with the patient. Patient to follow-up in office in 3 weeks. Dressings were changed prior to discharge and new post-op x-rays were obtained which showed appropriate position of the reverse total shoulder implants without any hardware complicating features.   Discharge Instructions    Call MD for:  redness, tenderness, or signs of infection (pain, swelling, redness, odor or green/yellow discharge around incision site)   Complete by: As directed    Call MD for:  severe uncontrolled pain   Complete by: As directed    Call MD for:  temperature >100.4   Complete by: As directed    Diet - low sodium heart healthy   Complete by: As directed      Allergies as of 09/27/2020      Reactions   Dust Mite Extract Itching, Cough   Latex Itching   Powder gloves      Medication List    STOP taking these medications   amLODipine 10 MG tablet Commonly known as: NORVASC  oxyCODONE-acetaminophen 5-325 MG tablet Commonly known as: PERCOCET/ROXICET   potassium chloride 10 MEQ CR capsule Commonly known as: MICRO-K   sitaGLIPtin 100 MG tablet Commonly known as: Januvia     TAKE these medications   acetaminophen 500 MG tablet Commonly known as: TYLENOL Take 2 tablets (1,000 mg  total) by mouth every 8 (eight) hours for 14 days. What changed:   how much to take  when to take this  reasons to take this   Advair Diskus 250-50 MCG/DOSE Aepb Generic drug: Fluticasone-Salmeterol Inhale 1 puff into the lungs 2 (two) times daily.   benazepril 40 MG tablet Commonly known as: LOTENSIN TAKE 1 TABLET BY MOUTH EVERY DAY   blood glucose meter kit and supplies Dispense based on patient and insurance preference. Use up to four times daily as directed. (FOR ICD-10 E10.9, E11.9).   blood glucose meter kit and supplies Kit Dispense based on patient and insurance preference. Use up to four times daily as directed. (FOR ICD-9 250.00, 250.01).   cyanocobalamin 2000 MCG tablet Take 2,000 mcg by mouth daily.   DULoxetine 20 MG capsule Commonly known as: CYMBALTA Take 1 capsule (20 mg total) by mouth every other day.   famotidine 20 MG tablet Commonly known as: PEPCID Take 1 tablet (20 mg total) by mouth daily. What changed:   when to take this  Another medication with the same name was removed. Continue taking this medication, and follow the directions you see here.   Ferrex 150 150 MG capsule Generic drug: iron polysaccharides TAKE 1 CAPSULE BY MOUTH EVERY DAY What changed: how much to take   fexofenadine 180 MG tablet Commonly known as: ALLEGRA Take 180 mg by mouth daily as needed for allergies.   fluticasone 50 MCG/ACT nasal spray Commonly known as: FLONASE Place 1 spray into both nostrils daily.   glipiZIDE 5 MG 24 hr tablet Commonly known as: Glucotrol XL Take 1 tablet (5 mg total) by mouth daily. What changed: when to take this   hydrochlorothiazide 12.5 MG tablet Commonly known as: HYDRODIURIL Take 1 tablet (12.5 mg total) by mouth daily.   Lancets 30G Misc USE TWICE DAILY AS DIRECTED FOR GLUCOSE TESTING AND MONITORING   ONE TOUCH LANCETS Misc Test twice daily.   metFORMIN 500 MG 24 hr tablet Commonly known as: Glucophage XR Take 1 tablet  (500 mg total) by mouth 2 (two) times daily.   metoprolol succinate 50 MG 24 hr tablet Commonly known as: TOPROL-XL TAKE 1 TABLET BY MOUTH EVERY DAY   multivitamin with minerals Tabs tablet Take 1 tablet by mouth daily.   omeprazole 40 MG capsule Commonly known as: PRILOSEC Take 40 mg by mouth daily.   OneTouch Verio test strip Generic drug: glucose blood TEST TWICE A DAY. DX E11.9   oxyCODONE 5 MG immediate release tablet Commonly known as: Oxy IR/ROXICODONE Take 1 pills every 8 hrs as needed for severe pain   pioglitazone 45 MG tablet Commonly known as: ACTOS TAKE 1 TABLET BY MOUTH EVERY DAY   pravastatin 40 MG tablet Commonly known as: PRAVACHOL Take 1 tablet (40 mg total) by mouth daily.   prednisoLONE acetate 1 % ophthalmic suspension Commonly known as: PRED FORTE Place 1 drop into both eyes See admin instructions. Place 1 drop into left eye three times daily Place 1 drop into right eye once daily   PROBIOTIC-10 PO Take 1 capsule by mouth daily.   rivaroxaban 10 MG Tabs tablet Commonly known as: Xarelto Take 1 tablet (  10 mg total) by mouth daily. For DVT prophylaxis after surgery   senna-docusate 8.6-50 MG tablet Commonly known as: Senokot-S Take 1 tablet by mouth at bedtime.   spironolactone 25 MG tablet Commonly known as: Aldactone Take 0.5 tablets (12.5 mg total) by mouth daily.   vitamin C 1000 MG tablet Take 1,000 mg by mouth daily.   Vitamin D 50 MCG (2000 UT) Caps Take 4,000 Units by mouth daily.       Contact information for follow-up providers    Schedule an appointment as soon as possible for a visit with Hiram Gash, MD.   Specialty: Orthopedic Surgery Why: For wound re-check Contact information: [address] [address] [phone]        [facility] [place] Follow up.   Specialty: [place] Why: to provide home health physical and occupational therapy Contact information: [place] [address] [phone]            Contact information for after-discharge care    Destination    [facility] .   Service: Skilled Nursing Contact information: [address] [place], PA-C 09/27/2020

## 2020-09-28 NOTE — Progress Notes (Deleted)
Chronic Care Management Pharmacy Note  09/28/2020 Name:  Doris Jackson MRN:  [id number] DOB:  02-Jan-1941  Subjective: Doris Jackson is an 80 y.o. year old female who is a primary patient of Koberlein, Steele Berg, MD.  The CCM team was consulted for assistance with disease management and care coordination needs.    Engaged with patient by telephone for follow up visit in response to provider referral for pharmacy case management and/or care coordination services.   Consent to Services:  The patient was given information about Chronic Care Management services, agreed to services, and gave verbal consent prior to initiation of services.  Please see initial visit note for detailed documentation.   Patient Care Team: Caren Macadam, MD as PCP - General (Family Medicine) Germaine Pomfret, [facility] as Pharmacist (Pharmacist)  Recent office visits: ***  Recent consult visits: [facility] visits: {Hospital DC Yes/No:25215}  Objective:  Lab Results  Component Value Date   CREATININE 1.24 (H) 09/24/2020   BUN 46 (H) 09/24/2020   GFR 64.16 12/19/2019   GFRNONAA 45 (L) 09/24/2020   GFRAA >60 10/21/2019   NA 137 09/24/2020   K 4.7 09/24/2020   CALCIUM 8.7 (L) 09/24/2020   CO2 27 09/24/2020   GLUCOSE 107 (H) 09/24/2020    Lab Results  Component Value Date/Time   HGBA1C 6.6 (H) 09/19/2020 08:08 AM   HGBA1C 6.2 (H) 03/21/2020 11:45 AM   GFR 64.16 12/19/2019 01:04 PM   GFR 68.05 11/16/2019 02:55 PM   MICROALBUR 0.7 03/21/2020 11:45 AM   MICROALBUR 1.3 07/02/2017 01:38 PM    Last diabetic Eye exam:  Lab Results  Component Value Date/Time   HMDIABEYEEXA No Retinopathy 06/05/2020 12:00 AM    Last diabetic Foot exam: No results found for: HMDIABFOOTEX   Lab Results  Component Value Date   CHOL 192 03/21/2020   HDL 40 (L) 03/21/2020   LDLCALC 127 (H) 03/21/2020   TRIG 141 03/21/2020   CHOLHDL 4.8 03/21/2020    Hepatic Function Latest Ref Rng & Units 03/21/2020 11/16/2019  09/23/2019  Total Protein 6.1 - 8.1 g/dL 7.1 7.3 6.2(L)  Albumin 3.5 - 5.2 g/dL - 3.9 2.7(L)  AST 10 - 35 U/L 10 11 14(L)  ALT 6 - 29 U/L _0 Alk Phosphatase 39 - 117 U/L - 110 71  Total Bilirubin 0.2 - 1.2 mg/dL 0.4 0.5 0.7  Bilirubin, Direct 0.0 - 0.3 mg/dL - - -    Lab Results  Component Value Date/Time   TSH 1.20 09/07/2019 10:52 AM   TSH 1.20 07/02/2017 01:30 PM    CBC Latest Ref Rng & Units 09/25/2020 09/24/2020 09/19/2020  WBC 4.0 - 10.5 K/uL 8.4 8.0 9.3  Hemoglobin 12.0 - 15.0 g/dL 8.1(L) 7.2(L) 8.5(L)  Hematocrit 36.0 - 46.0 % 24.3(L) 22.0(L) 25.5(L)  Platelets 150 - 400 K/uL 242 221 166    Lab Results  Component Value Date/Time   VD25OH 61 03/21/2020 11:45 AM   VD25OH 37 04/20/2012 11:11 AM    Clinical ASCVD: {YES/NO:21197} The 10-year ASCVD risk score Mikey Bussing DC Jr., et al., 2013) is: 32%   Values used to calculate the score:     Age: 53 years     Sex: Female     Is Non-Hispanic African American: Yes     Diabetic: Yes     Tobacco smoker: No     Systolic Blood Pressure: 520 mmHg     Is BP treated: Yes  HDL Cholesterol: 40 mg/dL     Total Cholesterol: 192 mg/dL    Depression screen [facility] 2/9 05/25/2018 04/19/2018 02/26/2016  Decreased Interest 0 0 0  Down, Depressed, Hopeless 0 0 0  PHQ - 2 Score 0 0 0  Some recent data might be hidden     ***Other: (CHADS2VASc if Afib, MMRC or CAT for COPD, ACT, DEXA)  Social History   Tobacco Use  Smoking Status Never Smoker  Smokeless Tobacco Never Used   BP Readings from Last 3 Encounters:  09/27/20 132/64  09/13/20 137/79  08/08/20 115/60   Pulse Readings from Last 3 Encounters:  09/27/20 90  09/13/20 93  07/18/20 86   Wt Readings from Last 3 Encounters:  09/19/20 226 lb 2.7 oz (102.6 kg)  09/13/20 225 lb (102.1 kg)  09/12/20 221 lb (100.2 kg)   BMI Readings from Last 3 Encounters:  09/19/20 41.37 kg/m  09/13/20 41.15 kg/m  09/12/20 40.42 kg/m    Assessment/Interventions: Review of patient  past medical history, allergies, medications, health status, including review of consultants reports, laboratory and other test data, was performed as part of comprehensive evaluation and provision of chronic care management services.   SDOH:  (Social Determinants of Health) assessments and interventions performed: {yes/no:20286}  SDOH Screenings   Alcohol Screen: Not on file  Depression (ZOX0-9): Not on file  Financial Resource Strain: High Risk  . Difficulty of Paying Living Expenses: Hard  Food Insecurity: Not on file  Housing: Not on file  Physical Activity: Not on file  Social Connections: Not on file  Stress: Not on file  Tobacco Use: Low Risk   . Smoking Tobacco Use: Never Smoker  . Smokeless Tobacco Use: Never Used  Transportation Needs: No Transportation Needs  . Lack of Transportation (Medical): No  . Lack of Transportation (Non-Medical): No    CCM Care Plan  Allergies  Allergen Reactions  . Dust Mite Extract Itching and Cough  . Latex Itching    Powder gloves    Medications Reviewed Today    Reviewed by Marcell Barlow, CPhT (Pharmacy Technician) on 09/20/20 at [place] List Status: Complete  Medication Order Taking? Sig Documenting Provider Last Dose Status Informant  acetaminophen (TYLENOL) 500 MG tablet [id number]  Take 500 mg by mouth every 6 (six) hours as needed for mild pain. [provider]  Active Self  ADVAIR DISKUS 250-50 MCG/DOSE AEPB [id number] Yes Inhale 1 puff into the lungs 2 (two) times daily. Caren Macadam, MD Past Month Unknown time Active   amLODipine (NORVASC) 10 MG tablet [id number] Yes Take 10 mg by mouth daily. [provider] 09/19/2020 Unknown time Active            Med Note (SUTPHIN, CHASIE F   Thu Sep 20, 2020 10:58 AM)    Ascorbic Acid (VITAMIN C) 1000 MG tablet [id number] Yes Take 1,000 mg by mouth daily. [provider] Past Week Unknown time Active Self  benazepril (LOTENSIN) 40 MG tablet [id number] Yes  TAKE 1 TABLET BY MOUTH EVERY DAY  Patient taking differently: Take 40 mg by mouth daily.   Caren Macadam, MD 09/19/2020 Unknown time Active            Med Note (SUTPHIN, CHASIE F   Thu Sep 20, 2020 10:58 AM)    blood glucose meter kit and supplies [id number]  Dispense based on patient and insurance preference. Use up to four times daily as directed. (FOR ICD-10 E10.9, E11.9). Koberlein, Junell C,  MD  Active   blood glucose meter kit and supplies KIT [id number]  Dispense based on patient and insurance preference. Use up to four times daily as directed. (FOR ICD-9 250.00, 250.01). Caren Macadam, MD  Active   Cholecalciferol (VITAMIN D) 2000 UNITS CAPS [id number] Yes Take 4,000 Units by mouth daily.  [provider] Past Week Unknown time Active Self  cyanocobalamin 2000 MCG tablet [id number] Yes Take 2,000 mcg by mouth daily. [provider] Past Week Unknown time Active Self  DULoxetine (CYMBALTA) 20 MG capsule [id number] Yes Take 1 capsule (20 mg total) by mouth every other day. Caren Macadam, MD 09/19/2020 Unknown time Active            Med Note (SUTPHIN, CHASIE F   Thu Sep 20, 2020 10:59 AM)    famotidine (PEPCID) 20 MG tablet [id number] No Take 1 tablet (20 mg total) by mouth at bedtime.  Patient not taking: Reported on 09/20/2020   Caren Macadam, MD Not Taking Unknown time Active   famotidine (PEPCID) 40 MG tablet [id number] Yes Take 40 mg by mouth daily. [provider] 09/18/2020 Active            Med Note Alesia Banda, CHASIE F   Thu Sep 20, 2020 10:59 AM)    FERREX 150 150 MG capsule [id number] Yes TAKE 1 CAPSULE BY MOUTH EVERY DAY  Patient taking differently: Take 150 mg by mouth daily.   Caren Macadam, MD 09/18/2020 Unknown time Active   fexofenadine (ALLEGRA) 180 MG tablet [id number] Yes Take 180 mg by mouth daily as needed for allergies. [provider] Past Week Unknown time Active Self  fluticasone (FLONASE) 50 MCG/ACT nasal spray  [id number] Yes Place 1 spray into both nostrils daily. [provider] Past Week Unknown time Active   glipiZIDE (GLUCOTROL XL) 5 MG 24 hr tablet [id number] Yes TAKE 1 TABLET BY MOUTH EVERY DAY  Patient taking differently: Take 5 mg by mouth daily with breakfast.   Caren Macadam, MD Past Week Unknown time Active            Med Note (SUTPHIN, CHASIE F   Thu Sep 20, 2020 11:00 AM)    hydrochlorothiazide (HYDRODIURIL) 12.5 MG tablet [id number] Yes Take 1 tablet (12.5 mg total) by mouth daily. Caren Macadam, MD 09/19/2020 Unknown time Active            Med Note Alesia Banda, CHASIE F   Thu Sep 20, 2020 11:00 AM)    Lancets 30G MISC [id number]  USE TWICE DAILY AS DIRECTED FOR GLUCOSE TESTING AND MONITORING Marletta Lor, MD  Active Self  metFORMIN (GLUCOPHAGE-XR) 500 MG 24 hr tablet [id number] Yes Take 2 tablets (1,000 mg total) by mouth daily with breakfast.  Patient taking differently: Take 500 mg by mouth 2 (two) times daily.   Caren Macadam, MD 09/18/2020 Unknown time Active            Med Note (SUTPHIN, CHASIE F   Thu Sep 20, 2020 11:00 AM)    metoprolol succinate (TOPROL-XL) 50 MG 24 hr tablet [id number] Yes TAKE 1 TABLET BY MOUTH EVERY DAY  Patient taking differently: Take 50 mg by mouth daily.   Caren Macadam, MD 09/19/2020 morning Active            Med Note Alesia Banda, CHASIE F   Thu Sep 20, 2020 11:00 AM)    Multiple Vitamin (MULTIVITAMIN WITH MINERALS) TABS tablet [id number] Yes Take 1 tablet by mouth  daily. Love, Pamela S, PA-C Past Week Unknown time Active Self  omeprazole (PRILOSEC) 40 MG capsule [id number] Yes Take 40 mg by mouth daily. [provider] 09/19/2020 Unknown time Active            Med Note Fransico Him Sep 20, 2020 11:01 AM)    Theadora Rama LANCETS MISC [id number]  Test twice daily. Marletta Lor, MD  Active Self  [facility] VERIO test strip [id number]  TEST TWICE A DAY. DX E11.9 Caren Macadam, MD  Active    oxyCODONE-acetaminophen (PERCOCET/ROXICET) 5-325 MG tablet [id number] Yes Take by mouth every 4 (four) hours as needed for severe pain. [provider] Past Week Unknown time Active            Med Note (SUTPHIN, CHASIE F   Thu Sep 20, 2020 11:01 AM)    pioglitazone (ACTOS) 45 MG tablet [id number] Yes TAKE 1 TABLET BY MOUTH EVERY DAY  Patient taking differently: Take 45 mg by mouth daily.   Caren Macadam, MD 09/18/2020 Unknown time Active            Med Note (SUTPHIN, CHASIE F   Thu Sep 20, 2020 11:01 AM)    potassium chloride (MICRO-K) 10 MEQ CR capsule [id number] Yes TAKE 1 CAPSULE TWICE A DAY X 3 DAYS, THEN TAKE WITH USE OF LASIX  Patient taking differently: Take 10 mEq by mouth daily.   Caren Macadam, MD 09/18/2020 Unknown time Active            Med Note (SUTPHIN, CHASIE F   Thu Sep 20, 2020 11:02 AM)    pravastatin (PRAVACHOL) 40 MG tablet [id number] Yes Take 1 tablet (40 mg total) by mouth daily. Caren Macadam, MD 09/18/2020 Unknown time Active            Med Note (SUTPHIN, CHASIE F   Thu Sep 20, 2020 11:02 AM)    prednisoLONE acetate (PRED FORTE) 1 % ophthalmic suspension [id number] Yes Place 1 drop into both eyes See admin instructions. Place 1 drop into left eye three times daily Place 1 drop into right eye once daily [provider] Past Week Unknown time Active Self           Med Note Maud Deed   Wed Sep 19, 2020  6:10 PM) LF : 08/21/20 30 day supply  Probiotic Product (PROBIOTIC-10 PO) [id number] Yes Take 1 capsule by mouth daily. [provider] Past Week Unknown time Active Self  senna-docusate (SENOKOT-S) 8.6-50 MG tablet [id number] Yes Take 1 tablet by mouth at bedtime. Bary Leriche, PA-C Past Week Unknown time Active Self  sitaGLIPtin (JANUVIA) 100 MG tablet [id number] No Take 1 tablet (100 mg total) by mouth daily.  Patient not taking: Reported on 09/20/2020   Caren Macadam, MD Not Taking Unknown time Active            Med  Note Marcell Barlow   Thu Sep 20, 2020 11:02 AM)    spironolactone (ALDACTONE) 25 MG tablet [id number] Yes TAKE 1/2 TABLET BY MOUTH EVERY DAY  Patient taking differently: Take 12.5 mg by mouth daily.   Caren Macadam, MD 09/19/2020 Unknown time Active            Med Note Marcell Barlow   Thu Sep 20, 2020 11:03 AM)            Patient Active Problem List   Diagnosis Date Noted  . Status post  reverse total arthroplasty of right shoulder 09/19/2020  . Allergic rhinitis due to animal (cat) (dog) hair and dander 07/18/2020  . Leiomyoma 07/18/2020  . 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 Anxiety state   . Acute blood loss anemia   . Postoperative pain   . Cervical myelopathy ([place]) 09/22/2019  . S/P cervical spinal fusion 09/22/2019  . 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 AKI (acute kidney injury) ([place]) 09/14/2019  . GERD (gastroesophageal reflux disease) 09/14/2019  . Spinal stenosis in cervical region 09/14/2019  . Weakness 09/14/2019  . Numbness and tingling 09/14/2019  . Cervical spinal stenosis 09/14/2019  . Status post cataract extraction and insertion of intraocular lens of left eye 09/04/2019  . Lumbar stenosis 08/24/2019  . Allergic rhinitis due to pollen 06/06/2019  . Persistent cough 06/06/2019  . History of Descemet membrane endothelial keratoplasty (DMEK) 01/04/2019  . Status post cataract extraction and insertion of intraocular lens of right eye 01/04/2019  . Senile nuclear sclerosis 01/02/2019  . 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 Obesity, morbid, BMI 40.0-49.9 ([place]) 04/19/2018  . Morbid obesity ([place]) 04/19/2018  . Cataract   . Fibroid   . Endometriosis   . DM (diabetes mellitus) type II controlled with renal manifestation ([place]) 06/17/2010  . Dyslipidemia 06/17/2010  . Microcytic anemia 06/17/2010  . Essential hypertension 06/17/2010  .  Allergic rhinitis 06/17/2010  . Osteoarthritis 06/17/2010  . LOW BACK PAIN 06/17/2010  . OSTEOPENIA 06/17/2010  . BREAST CANCER, HX OF 06/17/2010  . Disorder of skeletal system 06/17/2010    Immunization History  Administered Date(s) Administered  . Fluad Quad(high Dose 65+) 04/28/2019, 03/21/2020  . Influenza Split 04/30/2011, 04/28/2012, 04/13/2013  . Influenza Whole 04/02/2010  . Influenza, High Dose Seasonal PF 04/10/2015, 04/16/2016, 12/10/2016, 04/13/2017, 04/15/2018, 08/04/2018, 04/19/2019, 04/18/2020  . Influenza,inj,Quad PF,6+ Mos 04/06/2014  . PFIZER(Purple Top)SARS-COV-2 Vaccination 08/01/2019, 08/14/2019, 04/18/2020  . PPD Test 10/15/2010, 10/15/2010, 10/17/2010, 10/27/2011, 12/13/2012, 11/13/2015, 04/25/2019  . Pneumococcal Conjugate-13 07/12/2014  . Pneumococcal Polysaccharide-23 12/10/2016, 12/25/2017, 03/23/2019, 04/19/2019, 04/18/2020    Conditions to be addressed/monitored:  {USCCMDZASSESSMENTOPTIONS:23563}  There are no care plans that you recently modified to display for this patient.    Medication Assistance: {MEDASSISTANCEINFO:25044}  Patient's preferred pharmacy is:  [id number] [place] [place]Phone: [phone]: [phone] [address] [place] [address]Phone: [phone]: [phone] [id number] [address] [place]one: [phone]: [phone] pill box? {Yes or If no, why not?:20788} Pt endorses ***% compliance  We discussed: {Pharmacy options:24294} Patient decided to: {US Pharmacy Plan:23885}  Care Plan and Follow Up Patient Decision:  {FOLLOWUP:24991}  Plan: {CM FOLLOW UP PLAN:25073}  ***

## 2020-10-01 ENCOUNTER — Ambulatory Visit: Payer: Medicare HMO | Admitting: Family Medicine

## 2020-10-01 DIAGNOSIS — D649 Anemia, unspecified: Secondary | ICD-10-CM | POA: Diagnosis not present

## 2020-10-01 DIAGNOSIS — E119 Type 2 diabetes mellitus without complications: Secondary | ICD-10-CM | POA: Diagnosis not present

## 2020-10-01 DIAGNOSIS — M1990 Unspecified osteoarthritis, unspecified site: Secondary | ICD-10-CM | POA: Diagnosis not present

## 2020-10-01 DIAGNOSIS — K219 Gastro-esophageal reflux disease without esophagitis: Secondary | ICD-10-CM | POA: Diagnosis not present

## 2020-10-01 DIAGNOSIS — J449 Chronic obstructive pulmonary disease, unspecified: Secondary | ICD-10-CM | POA: Diagnosis not present

## 2020-10-01 DIAGNOSIS — Z947 Corneal transplant status: Secondary | ICD-10-CM | POA: Diagnosis not present

## 2020-10-01 DIAGNOSIS — Z7984 Long term (current) use of oral hypoglycemic drugs: Secondary | ICD-10-CM | POA: Diagnosis not present

## 2020-10-01 DIAGNOSIS — E785 Hyperlipidemia, unspecified: Secondary | ICD-10-CM | POA: Diagnosis not present

## 2020-10-01 DIAGNOSIS — I10 Essential (primary) hypertension: Secondary | ICD-10-CM | POA: Diagnosis not present

## 2020-10-01 DIAGNOSIS — Z981 Arthrodesis status: Secondary | ICD-10-CM | POA: Diagnosis not present

## 2020-10-01 DIAGNOSIS — S42201D Unspecified fracture of upper end of right humerus, subsequent encounter for fracture with routine healing: Secondary | ICD-10-CM | POA: Diagnosis not present

## 2020-10-01 DIAGNOSIS — Z7901 Long term (current) use of anticoagulants: Secondary | ICD-10-CM | POA: Diagnosis not present

## 2020-10-01 NOTE — Progress Notes (Deleted)
  BRIDGETTE WOLDEN DOB: 1940/12/06 Encounter date: 10/01/2020  This is a 80 y.o. female who presents with No chief complaint on file.   History of present illness: Patient recently admitted to the hospital after a right proximal humerus fracture and surgical reverse total arthroplasty.  Admitted: 09/19/2020.  Discharge 09/28/2020.  Discharged to rehab facility    HPI   Allergies  Allergen Reactions  . Dust Mite Extract Itching and Cough  . Latex Itching    Powder gloves   No outpatient medications have been marked as taking for the 10/01/20 encounter (Appointment) with Caren Macadam, MD.    Review of Systems  Objective:  There were no vitals taken for this visit.      BP Readings from Last 3 Encounters:  09/27/20 132/64  09/13/20 137/79  08/08/20 115/60   Wt Readings from Last 3 Encounters:  09/19/20 226 lb 2.7 oz (102.6 kg)  09/13/20 225 lb (102.1 kg)  09/12/20 221 lb (100.2 kg)    Physical Exam  Assessment/Plan  There are no diagnoses linked to this encounter.       [name], MD

## 2020-10-03 ENCOUNTER — Other Ambulatory Visit: Payer: Self-pay | Admitting: Family Medicine

## 2020-10-04 ENCOUNTER — Other Ambulatory Visit: Payer: Self-pay | Admitting: Family Medicine

## 2020-10-04 DIAGNOSIS — I10 Essential (primary) hypertension: Secondary | ICD-10-CM | POA: Diagnosis not present

## 2020-10-04 DIAGNOSIS — E119 Type 2 diabetes mellitus without complications: Secondary | ICD-10-CM | POA: Diagnosis not present

## 2020-10-04 DIAGNOSIS — Z947 Corneal transplant status: Secondary | ICD-10-CM | POA: Diagnosis not present

## 2020-10-04 DIAGNOSIS — Z7901 Long term (current) use of anticoagulants: Secondary | ICD-10-CM | POA: Diagnosis not present

## 2020-10-04 DIAGNOSIS — Z7984 Long term (current) use of oral hypoglycemic drugs: Secondary | ICD-10-CM | POA: Diagnosis not present

## 2020-10-04 DIAGNOSIS — E785 Hyperlipidemia, unspecified: Secondary | ICD-10-CM | POA: Diagnosis not present

## 2020-10-04 DIAGNOSIS — J449 Chronic obstructive pulmonary disease, unspecified: Secondary | ICD-10-CM | POA: Diagnosis not present

## 2020-10-04 DIAGNOSIS — K219 Gastro-esophageal reflux disease without esophagitis: Secondary | ICD-10-CM | POA: Diagnosis not present

## 2020-10-04 DIAGNOSIS — Z981 Arthrodesis status: Secondary | ICD-10-CM | POA: Diagnosis not present

## 2020-10-04 DIAGNOSIS — M1990 Unspecified osteoarthritis, unspecified site: Secondary | ICD-10-CM | POA: Diagnosis not present

## 2020-10-04 DIAGNOSIS — S42201D Unspecified fracture of upper end of right humerus, subsequent encounter for fracture with routine healing: Secondary | ICD-10-CM | POA: Diagnosis not present

## 2020-10-04 DIAGNOSIS — D649 Anemia, unspecified: Secondary | ICD-10-CM | POA: Diagnosis not present

## 2020-10-08 DIAGNOSIS — Z947 Corneal transplant status: Secondary | ICD-10-CM | POA: Diagnosis not present

## 2020-10-08 DIAGNOSIS — Z7901 Long term (current) use of anticoagulants: Secondary | ICD-10-CM | POA: Diagnosis not present

## 2020-10-08 DIAGNOSIS — I10 Essential (primary) hypertension: Secondary | ICD-10-CM | POA: Diagnosis not present

## 2020-10-08 DIAGNOSIS — Z981 Arthrodesis status: Secondary | ICD-10-CM | POA: Diagnosis not present

## 2020-10-08 DIAGNOSIS — K219 Gastro-esophageal reflux disease without esophagitis: Secondary | ICD-10-CM | POA: Diagnosis not present

## 2020-10-08 DIAGNOSIS — J449 Chronic obstructive pulmonary disease, unspecified: Secondary | ICD-10-CM | POA: Diagnosis not present

## 2020-10-08 DIAGNOSIS — S42201D Unspecified fracture of upper end of right humerus, subsequent encounter for fracture with routine healing: Secondary | ICD-10-CM | POA: Diagnosis not present

## 2020-10-08 DIAGNOSIS — D649 Anemia, unspecified: Secondary | ICD-10-CM | POA: Diagnosis not present

## 2020-10-08 DIAGNOSIS — M1990 Unspecified osteoarthritis, unspecified site: Secondary | ICD-10-CM | POA: Diagnosis not present

## 2020-10-08 DIAGNOSIS — Z7984 Long term (current) use of oral hypoglycemic drugs: Secondary | ICD-10-CM | POA: Diagnosis not present

## 2020-10-08 DIAGNOSIS — E119 Type 2 diabetes mellitus without complications: Secondary | ICD-10-CM | POA: Diagnosis not present

## 2020-10-08 DIAGNOSIS — E785 Hyperlipidemia, unspecified: Secondary | ICD-10-CM | POA: Diagnosis not present

## 2020-10-09 DIAGNOSIS — J3089 Other allergic rhinitis: Secondary | ICD-10-CM | POA: Diagnosis not present

## 2020-10-09 DIAGNOSIS — J301 Allergic rhinitis due to pollen: Secondary | ICD-10-CM | POA: Diagnosis not present

## 2020-10-10 DIAGNOSIS — Z7901 Long term (current) use of anticoagulants: Secondary | ICD-10-CM | POA: Diagnosis not present

## 2020-10-10 DIAGNOSIS — E785 Hyperlipidemia, unspecified: Secondary | ICD-10-CM | POA: Diagnosis not present

## 2020-10-10 DIAGNOSIS — Z981 Arthrodesis status: Secondary | ICD-10-CM | POA: Diagnosis not present

## 2020-10-10 DIAGNOSIS — Z947 Corneal transplant status: Secondary | ICD-10-CM | POA: Diagnosis not present

## 2020-10-10 DIAGNOSIS — D649 Anemia, unspecified: Secondary | ICD-10-CM | POA: Diagnosis not present

## 2020-10-10 DIAGNOSIS — I10 Essential (primary) hypertension: Secondary | ICD-10-CM | POA: Diagnosis not present

## 2020-10-10 DIAGNOSIS — S42201D Unspecified fracture of upper end of right humerus, subsequent encounter for fracture with routine healing: Secondary | ICD-10-CM | POA: Diagnosis not present

## 2020-10-10 DIAGNOSIS — Z7984 Long term (current) use of oral hypoglycemic drugs: Secondary | ICD-10-CM | POA: Diagnosis not present

## 2020-10-10 DIAGNOSIS — J449 Chronic obstructive pulmonary disease, unspecified: Secondary | ICD-10-CM | POA: Diagnosis not present

## 2020-10-10 DIAGNOSIS — K219 Gastro-esophageal reflux disease without esophagitis: Secondary | ICD-10-CM | POA: Diagnosis not present

## 2020-10-10 DIAGNOSIS — M1990 Unspecified osteoarthritis, unspecified site: Secondary | ICD-10-CM | POA: Diagnosis not present

## 2020-10-10 DIAGNOSIS — E119 Type 2 diabetes mellitus without complications: Secondary | ICD-10-CM | POA: Diagnosis not present

## 2020-10-11 DIAGNOSIS — M1990 Unspecified osteoarthritis, unspecified site: Secondary | ICD-10-CM | POA: Diagnosis not present

## 2020-10-11 DIAGNOSIS — S42201D Unspecified fracture of upper end of right humerus, subsequent encounter for fracture with routine healing: Secondary | ICD-10-CM | POA: Diagnosis not present

## 2020-10-11 DIAGNOSIS — I10 Essential (primary) hypertension: Secondary | ICD-10-CM | POA: Diagnosis not present

## 2020-10-11 DIAGNOSIS — Z981 Arthrodesis status: Secondary | ICD-10-CM | POA: Diagnosis not present

## 2020-10-11 DIAGNOSIS — J449 Chronic obstructive pulmonary disease, unspecified: Secondary | ICD-10-CM | POA: Diagnosis not present

## 2020-10-11 DIAGNOSIS — Z7901 Long term (current) use of anticoagulants: Secondary | ICD-10-CM | POA: Diagnosis not present

## 2020-10-11 DIAGNOSIS — K219 Gastro-esophageal reflux disease without esophagitis: Secondary | ICD-10-CM | POA: Diagnosis not present

## 2020-10-11 DIAGNOSIS — E785 Hyperlipidemia, unspecified: Secondary | ICD-10-CM | POA: Diagnosis not present

## 2020-10-11 DIAGNOSIS — Z947 Corneal transplant status: Secondary | ICD-10-CM | POA: Diagnosis not present

## 2020-10-11 DIAGNOSIS — Z7984 Long term (current) use of oral hypoglycemic drugs: Secondary | ICD-10-CM | POA: Diagnosis not present

## 2020-10-11 DIAGNOSIS — E119 Type 2 diabetes mellitus without complications: Secondary | ICD-10-CM | POA: Diagnosis not present

## 2020-10-11 DIAGNOSIS — D649 Anemia, unspecified: Secondary | ICD-10-CM | POA: Diagnosis not present

## 2020-10-15 DIAGNOSIS — D649 Anemia, unspecified: Secondary | ICD-10-CM | POA: Diagnosis not present

## 2020-10-15 DIAGNOSIS — E785 Hyperlipidemia, unspecified: Secondary | ICD-10-CM | POA: Diagnosis not present

## 2020-10-15 DIAGNOSIS — M1990 Unspecified osteoarthritis, unspecified site: Secondary | ICD-10-CM | POA: Diagnosis not present

## 2020-10-15 DIAGNOSIS — Z981 Arthrodesis status: Secondary | ICD-10-CM | POA: Diagnosis not present

## 2020-10-15 DIAGNOSIS — Z7901 Long term (current) use of anticoagulants: Secondary | ICD-10-CM | POA: Diagnosis not present

## 2020-10-15 DIAGNOSIS — I10 Essential (primary) hypertension: Secondary | ICD-10-CM | POA: Diagnosis not present

## 2020-10-15 DIAGNOSIS — K219 Gastro-esophageal reflux disease without esophagitis: Secondary | ICD-10-CM | POA: Diagnosis not present

## 2020-10-15 DIAGNOSIS — Z947 Corneal transplant status: Secondary | ICD-10-CM | POA: Diagnosis not present

## 2020-10-15 DIAGNOSIS — S42201D Unspecified fracture of upper end of right humerus, subsequent encounter for fracture with routine healing: Secondary | ICD-10-CM | POA: Diagnosis not present

## 2020-10-15 DIAGNOSIS — E119 Type 2 diabetes mellitus without complications: Secondary | ICD-10-CM | POA: Diagnosis not present

## 2020-10-15 DIAGNOSIS — J449 Chronic obstructive pulmonary disease, unspecified: Secondary | ICD-10-CM | POA: Diagnosis not present

## 2020-10-15 DIAGNOSIS — Z7984 Long term (current) use of oral hypoglycemic drugs: Secondary | ICD-10-CM | POA: Diagnosis not present

## 2020-10-18 DIAGNOSIS — S42201D Unspecified fracture of upper end of right humerus, subsequent encounter for fracture with routine healing: Secondary | ICD-10-CM | POA: Diagnosis not present

## 2020-10-18 DIAGNOSIS — D649 Anemia, unspecified: Secondary | ICD-10-CM | POA: Diagnosis not present

## 2020-10-18 DIAGNOSIS — Z7984 Long term (current) use of oral hypoglycemic drugs: Secondary | ICD-10-CM | POA: Diagnosis not present

## 2020-10-18 DIAGNOSIS — M1990 Unspecified osteoarthritis, unspecified site: Secondary | ICD-10-CM | POA: Diagnosis not present

## 2020-10-18 DIAGNOSIS — Z7901 Long term (current) use of anticoagulants: Secondary | ICD-10-CM | POA: Diagnosis not present

## 2020-10-18 DIAGNOSIS — Z947 Corneal transplant status: Secondary | ICD-10-CM | POA: Diagnosis not present

## 2020-10-18 DIAGNOSIS — K219 Gastro-esophageal reflux disease without esophagitis: Secondary | ICD-10-CM | POA: Diagnosis not present

## 2020-10-18 DIAGNOSIS — E785 Hyperlipidemia, unspecified: Secondary | ICD-10-CM | POA: Diagnosis not present

## 2020-10-18 DIAGNOSIS — E119 Type 2 diabetes mellitus without complications: Secondary | ICD-10-CM | POA: Diagnosis not present

## 2020-10-18 DIAGNOSIS — J449 Chronic obstructive pulmonary disease, unspecified: Secondary | ICD-10-CM | POA: Diagnosis not present

## 2020-10-18 DIAGNOSIS — Z981 Arthrodesis status: Secondary | ICD-10-CM | POA: Diagnosis not present

## 2020-10-18 DIAGNOSIS — I10 Essential (primary) hypertension: Secondary | ICD-10-CM | POA: Diagnosis not present

## 2020-10-19 DIAGNOSIS — J449 Chronic obstructive pulmonary disease, unspecified: Secondary | ICD-10-CM | POA: Diagnosis not present

## 2020-10-19 DIAGNOSIS — M1990 Unspecified osteoarthritis, unspecified site: Secondary | ICD-10-CM | POA: Diagnosis not present

## 2020-10-19 DIAGNOSIS — K219 Gastro-esophageal reflux disease without esophagitis: Secondary | ICD-10-CM | POA: Diagnosis not present

## 2020-10-19 DIAGNOSIS — Z947 Corneal transplant status: Secondary | ICD-10-CM | POA: Diagnosis not present

## 2020-10-19 DIAGNOSIS — E119 Type 2 diabetes mellitus without complications: Secondary | ICD-10-CM | POA: Diagnosis not present

## 2020-10-19 DIAGNOSIS — I10 Essential (primary) hypertension: Secondary | ICD-10-CM | POA: Diagnosis not present

## 2020-10-19 DIAGNOSIS — Z7984 Long term (current) use of oral hypoglycemic drugs: Secondary | ICD-10-CM | POA: Diagnosis not present

## 2020-10-19 DIAGNOSIS — D649 Anemia, unspecified: Secondary | ICD-10-CM | POA: Diagnosis not present

## 2020-10-19 DIAGNOSIS — Z7901 Long term (current) use of anticoagulants: Secondary | ICD-10-CM | POA: Diagnosis not present

## 2020-10-19 DIAGNOSIS — E785 Hyperlipidemia, unspecified: Secondary | ICD-10-CM | POA: Diagnosis not present

## 2020-10-19 DIAGNOSIS — Z981 Arthrodesis status: Secondary | ICD-10-CM | POA: Diagnosis not present

## 2020-10-19 DIAGNOSIS — S42201D Unspecified fracture of upper end of right humerus, subsequent encounter for fracture with routine healing: Secondary | ICD-10-CM | POA: Diagnosis not present

## 2020-10-25 DIAGNOSIS — E785 Hyperlipidemia, unspecified: Secondary | ICD-10-CM | POA: Diagnosis not present

## 2020-10-25 DIAGNOSIS — D649 Anemia, unspecified: Secondary | ICD-10-CM | POA: Diagnosis not present

## 2020-10-25 DIAGNOSIS — S42201D Unspecified fracture of upper end of right humerus, subsequent encounter for fracture with routine healing: Secondary | ICD-10-CM | POA: Diagnosis not present

## 2020-10-25 DIAGNOSIS — Z7901 Long term (current) use of anticoagulants: Secondary | ICD-10-CM | POA: Diagnosis not present

## 2020-10-25 DIAGNOSIS — K219 Gastro-esophageal reflux disease without esophagitis: Secondary | ICD-10-CM | POA: Diagnosis not present

## 2020-10-25 DIAGNOSIS — E119 Type 2 diabetes mellitus without complications: Secondary | ICD-10-CM | POA: Diagnosis not present

## 2020-10-25 DIAGNOSIS — J449 Chronic obstructive pulmonary disease, unspecified: Secondary | ICD-10-CM | POA: Diagnosis not present

## 2020-10-25 DIAGNOSIS — M1990 Unspecified osteoarthritis, unspecified site: Secondary | ICD-10-CM | POA: Diagnosis not present

## 2020-10-25 DIAGNOSIS — I10 Essential (primary) hypertension: Secondary | ICD-10-CM | POA: Diagnosis not present

## 2020-10-25 DIAGNOSIS — Z981 Arthrodesis status: Secondary | ICD-10-CM | POA: Diagnosis not present

## 2020-10-25 DIAGNOSIS — Z7984 Long term (current) use of oral hypoglycemic drugs: Secondary | ICD-10-CM | POA: Diagnosis not present

## 2020-10-25 DIAGNOSIS — Z947 Corneal transplant status: Secondary | ICD-10-CM | POA: Diagnosis not present

## 2020-10-26 ENCOUNTER — Encounter: Payer: Self-pay | Admitting: Pharmacist

## 2020-10-26 ENCOUNTER — Telehealth: Payer: Self-pay

## 2020-10-26 DIAGNOSIS — Z947 Corneal transplant status: Secondary | ICD-10-CM | POA: Diagnosis not present

## 2020-10-26 DIAGNOSIS — S42201D Unspecified fracture of upper end of right humerus, subsequent encounter for fracture with routine healing: Secondary | ICD-10-CM | POA: Diagnosis not present

## 2020-10-26 DIAGNOSIS — M1990 Unspecified osteoarthritis, unspecified site: Secondary | ICD-10-CM | POA: Diagnosis not present

## 2020-10-26 DIAGNOSIS — J449 Chronic obstructive pulmonary disease, unspecified: Secondary | ICD-10-CM | POA: Diagnosis not present

## 2020-10-26 DIAGNOSIS — D649 Anemia, unspecified: Secondary | ICD-10-CM | POA: Diagnosis not present

## 2020-10-26 DIAGNOSIS — E119 Type 2 diabetes mellitus without complications: Secondary | ICD-10-CM | POA: Diagnosis not present

## 2020-10-26 DIAGNOSIS — Z7901 Long term (current) use of anticoagulants: Secondary | ICD-10-CM | POA: Diagnosis not present

## 2020-10-26 DIAGNOSIS — I10 Essential (primary) hypertension: Secondary | ICD-10-CM | POA: Diagnosis not present

## 2020-10-26 DIAGNOSIS — Z7984 Long term (current) use of oral hypoglycemic drugs: Secondary | ICD-10-CM | POA: Diagnosis not present

## 2020-10-26 DIAGNOSIS — Z981 Arthrodesis status: Secondary | ICD-10-CM | POA: Diagnosis not present

## 2020-10-26 DIAGNOSIS — E785 Hyperlipidemia, unspecified: Secondary | ICD-10-CM | POA: Diagnosis not present

## 2020-10-26 DIAGNOSIS — K219 Gastro-esophageal reflux disease without esophagitis: Secondary | ICD-10-CM | POA: Diagnosis not present

## 2020-10-26 NOTE — Chronic Care Management (AMB) (Signed)
    Chronic Care Management Pharmacy Assistant   Name: Doris Jackson  MRN: [id number] DOB: January 14, 1941  Spoke with patient and reminded her of upcoming appointment on Monday 10/29/20 at 12pm over telephone. Informed patient to have her her medications close by and to have any Blood pressure or blood glucose readings ready to report to [name] the clinical pharmacist. Patient verbalized understanding and thanked me for my call.   [place] [phone]

## 2020-10-26 NOTE — Chronic Care Management (AMB) (Signed)
Error

## 2020-10-27 DIAGNOSIS — M4802 Spinal stenosis, cervical region: Secondary | ICD-10-CM | POA: Diagnosis not present

## 2020-10-29 ENCOUNTER — Ambulatory Visit (INDEPENDENT_AMBULATORY_CARE_PROVIDER_SITE_OTHER): Payer: Medicare HMO | Admitting: Pharmacist

## 2020-10-29 DIAGNOSIS — E1129 Type 2 diabetes mellitus with other diabetic kidney complication: Secondary | ICD-10-CM | POA: Diagnosis not present

## 2020-10-29 DIAGNOSIS — I10 Essential (primary) hypertension: Secondary | ICD-10-CM

## 2020-10-29 NOTE — Progress Notes (Signed)
Chronic Care Management Pharmacy Note  11/05/2020 Name:  KENNEISHA COCHRANE MRN:  [id number] DOB:  [birth year]  Subjective: Doris Jackson is an 80 y.o. year old female who is a primary patient of Koberlein, Steele Berg, MD.  The CCM team was consulted for assistance with disease management and care coordination needs.    Engaged with patient by telephone for follow up visit in response to provider referral for pharmacy case management and/or care coordination services.   Consent to Services:  The patient was given information about Chronic Care Management services, agreed to services, and gave verbal consent prior to initiation of services.  Please see initial visit note for detailed documentation.   Patient Care Team: Caren Macadam, MD as PCP - General (Family Medicine) Viona Gilmore, [facility] as Pharmacist (Pharmacist)  Recent office visits: 08/08/20 [name], MD: Patient presented for video visit for hypertension follow up.  07/18/20 Carolann Littler, MD: Patient presented with hypotension. Recommended continue holding amlodipine. Consider repeat A1c and hemoglobin in 1 month.   06/14/20 Grier Mitts, MD: Patient presented for hypertension. Continue holding amlodipine 10 mg daily.   Recent consult visits: 10/09/20 [name], MD (allergy): Patient presented for allergy shots.  06/18/20 Worthy Keeler, PA-C (pulmonology): Patient presented the cough follow up. Recommended to stop omeprazole and continue famotidine 20 mg daily. Pt reports Advair is not covered by insurance.  06/12/20 Dineen Kid, MD (ophthalmology): Patient presented for K recheck.  06/05/20 Heather [place] (optometry): Patient presented for diabetic eye exam.  Hospital visits: 3/23-3/31/22 Patient admitted for reverse shoulder arthroplasty after fracture.  09/13/20 Patient presented to the ED post fall and shoulder fracture.  Objective:  Lab Results  Component Value Date   CREATININE 1.24 (H) 09/24/2020    BUN 46 (H) 09/24/2020   GFR 64.16 12/19/2019   GFRNONAA 45 (L) 09/24/2020   GFRAA >60 10/21/2019   NA 137 09/24/2020   K 4.7 09/24/2020   CALCIUM 8.7 (L) 09/24/2020   CO2 27 09/24/2020   GLUCOSE 107 (H) 09/24/2020    Lab Results  Component Value Date/Time   HGBA1C 6.6 (H) 09/19/2020 08:08 AM   HGBA1C 6.2 (H) 03/21/2020 11:45 AM   GFR 64.16 12/19/2019 01:04 PM   GFR 68.05 11/16/2019 02:55 PM   MICROALBUR 0.7 03/21/2020 11:45 AM   MICROALBUR 1.3 07/02/2017 01:38 PM    Last diabetic Eye exam:  Lab Results  Component Value Date/Time   HMDIABEYEEXA No Retinopathy 06/05/2020 12:00 AM    Last diabetic Foot exam: No results found for: HMDIABFOOTEX   Lab Results  Component Value Date   CHOL 192 03/21/2020   HDL 40 (L) 03/21/2020   LDLCALC 127 (H) 03/21/2020   TRIG 141 03/21/2020   CHOLHDL 4.8 03/21/2020    Hepatic Function Latest Ref Rng & Units 03/21/2020 11/16/2019 09/23/2019  Total Protein 6.1 - 8.1 g/dL 7.1 7.3 6.2(L)  Albumin 3.5 - 5.2 g/dL - 3.9 2.7(L)  AST 10 - 35 U/L 10 11 14(L)  ALT 6 - 29 U/L '6 6 16  ' Alk Phosphatase 39 - 117 U/L - 110 71  Total Bilirubin 0.2 - 1.2 mg/dL 0.4 0.5 0.7  Bilirubin, Direct 0.0 - 0.3 mg/dL - - -    Lab Results  Component Value Date/Time   TSH 1.20 09/07/2019 10:52 AM   TSH 1.20 07/02/2017 01:30 PM    CBC Latest Ref Rng & Units 09/25/2020 09/24/2020 09/19/2020  WBC 4.0 - 10.5 K/uL 8.4 8.0 9.3  Hemoglobin 12.0 -  15.0 g/dL 8.1(L) 7.2(L) 8.5(L)  Hematocrit 36.0 - 46.0 % 24.3(L) 22.0(L) 25.5(L)  Platelets 150 - 400 K/uL 242 221 166    Lab Results  Component Value Date/Time   VD25OH 61 03/21/2020 11:45 AM   VD25OH 37 04/20/2012 11:11 AM    Clinical ASCVD: No  The 10-year ASCVD risk score Mikey Bussing DC Jr., et al., 2013) is: 32%   Values used to calculate the score:     Age: 80 years     Sex: Female     Is Non-Hispanic African American: Yes     Diabetic: Yes     Tobacco smoker: No     Systolic Blood Pressure: 509 mmHg     Is BP  treated: Yes     HDL Cholesterol: 40 mg/dL     Total Cholesterol: 192 mg/dL    Depression screen [facility] 2/9 05/25/2018 04/19/2018 02/26/2016  Decreased Interest 0 0 0  Down, Depressed, Hopeless 0 0 0  PHQ - 2 Score 0 0 0  Some recent data might be hidden      Social History   Tobacco Use  Smoking Status Never Smoker  Smokeless Tobacco Never Used   BP Readings from Last 3 Encounters:  09/27/20 132/64  09/13/20 137/79  08/08/20 115/60   Pulse Readings from Last 3 Encounters:  09/27/20 90  09/13/20 93  07/18/20 86   Wt Readings from Last 3 Encounters:  09/19/20 226 lb 2.7 oz (102.6 kg)  09/13/20 225 lb (102.1 kg)  09/12/20 221 lb (100.2 kg)   BMI Readings from Last 3 Encounters:  09/19/20 41.37 kg/m  09/13/20 41.15 kg/m  09/12/20 40.42 kg/m    Assessment/Interventions: Review of patient past medical history, allergies, medications, health status, including review of consultants reports, laboratory and other test data, was performed as part of comprehensive evaluation and provision of chronic care management services.   SDOH:  (Social Determinants of Health) assessments and interventions performed: No  SDOH Screenings   Alcohol Screen: Not on file  Depression (TOI7-1): Not on file  Financial Resource Strain: High Risk  . Difficulty of Paying Living Expenses: Hard  Food Insecurity: Not on file  Housing: Not on file  Physical Activity: Not on file  Social Connections: Not on file  Stress: Not on file  Tobacco Use: Low Risk   . Smoking Tobacco Use: Never Smoker  . Smokeless Tobacco Use: Never Used  Transportation Needs: No Transportation Needs  . Lack of Transportation (Medical): No  . Lack of Transportation (Non-Medical): No   Patient reports she is currently going to OT sessions twice a week and this will be going on for 4 more weeks. She did not end up getting admitted to a SNF.   CCM Care Plan  Allergies  Allergen Reactions  . Dust Mite Extract Itching  and Cough  . Latex Itching    Powder gloves    Medications Reviewed Today    Reviewed by Marcell Barlow, CPhT (Pharmacy Technician) on 09/20/20 at [facility] Status: Complete  Medication Order Taking? Sig Documenting Provider Last Dose Status Informant  acetaminophen (TYLENOL) 500 MG tablet [id number]  Take 500 mg by mouth every 6 (six) hours as needed for mild pain. [provider]  Active Self  ADVAIR DISKUS 250-50 MCG/DOSE AEPB [id number] Yes Inhale 1 puff into the lungs 2 (two) times daily. Caren Macadam, MD Past Month Unknown time Active   amLODipine (NORVASC) 10 MG tablet [id number] Yes Take 10 mg by  mouth daily. [provider] 09/19/2020 Unknown time Active            Med Note (SUTPHIN, CHASIE F   Thu Sep 20, 2020 10:58 AM)    Ascorbic Acid (VITAMIN C) 1000 MG tablet [id number] Yes Take 1,000 mg by mouth daily. [provider] Past Week Unknown time Active Self  benazepril (LOTENSIN) 40 MG tablet [id number] Yes TAKE 1 TABLET BY MOUTH EVERY DAY  Patient taking differently: Take 40 mg by mouth daily.   Caren Macadam, MD 09/19/2020 Unknown time Active            Med Note (SUTPHIN, CHASIE F   Thu Sep 20, 2020 10:58 AM)    blood glucose meter kit and supplies [id number]  Dispense based on patient and insurance preference. Use up to four times daily as directed. (FOR ICD-10 E10.9, E11.9). Caren Macadam, MD  Active   blood glucose meter kit and supplies KIT [id number]  Dispense based on patient and insurance preference. Use up to four times daily as directed. (FOR ICD-9 250.00, 250.01). Caren Macadam, MD  Active   Cholecalciferol (VITAMIN D) 2000 UNITS CAPS [id number] Yes Take 4,000 Units by mouth daily.  [provider] Past Week Unknown time Active Self  cyanocobalamin 2000 MCG tablet [id number] Yes Take 2,000 mcg by mouth daily. [provider] Past Week Unknown time Active Self  DULoxetine (CYMBALTA) 20 MG capsule [id number]  Yes Take 1 capsule (20 mg total) by mouth every other day. Caren Macadam, MD 09/19/2020 Unknown time Active            Med Note (SUTPHIN, CHASIE F   Thu Sep 20, 2020 10:59 AM)    famotidine (PEPCID) 20 MG tablet [id number] No Take 1 tablet (20 mg total) by mouth at bedtime.  Patient not taking: Reported on 09/20/2020   Caren Macadam, MD Not Taking Unknown time Active   famotidine (PEPCID) 40 MG tablet [id number] Yes Take 40 mg by mouth daily. [provider] 09/18/2020 Active            Med Note Alesia Banda, CHASIE F   Thu Sep 20, 2020 10:59 AM)    FERREX 150 150 MG capsule [id number] Yes TAKE 1 CAPSULE BY MOUTH EVERY DAY  Patient taking differently: Take 150 mg by mouth daily.   Caren Macadam, MD 09/18/2020 Unknown time Active   fexofenadine (ALLEGRA) 180 MG tablet [id number] Yes Take 180 mg by mouth daily as needed for allergies. [provider] Past Week Unknown time Active Self  fluticasone (FLONASE) 50 MCG/ACT nasal spray [id number] Yes Place 1 spray into both nostrils daily. [provider] Past Week Unknown time Active   glipiZIDE (GLUCOTROL XL) 5 MG 24 hr tablet [id number] Yes TAKE 1 TABLET BY MOUTH EVERY DAY  Patient taking differently: Take 5 mg by mouth daily with breakfast.   Caren Macadam, MD Past Week Unknown time Active            Med Note (SUTPHIN, CHASIE F   Thu Sep 20, 2020 11:00 AM)    hydrochlorothiazide (HYDRODIURIL) 12.5 MG tablet [id number] Yes Take 1 tablet (12.5 mg total) by mouth daily. Caren Macadam, MD 09/19/2020 Unknown time Active            Med Note Marcell Barlow   Thu Sep 20, 2020 11:00 AM)    Lancets 30G MISC [id number]  USE TWICE DAILY AS DIRECTED FOR GLUCOSE TESTING AND MONITORING Marletta Lor,  MD  Active Self  metFORMIN (GLUCOPHAGE-XR) 500 MG 24 hr tablet [id number] Yes Take 2 tablets (1,000 mg total) by mouth daily with breakfast.  Patient taking differently: Take 500 mg by mouth 2 (two) times daily.    Caren Macadam, MD 09/18/2020 Unknown time Active            Med Note (SUTPHIN, CHASIE F   Thu Sep 20, 2020 11:00 AM)    metoprolol succinate (TOPROL-XL) 50 MG 24 hr tablet [id number] Yes TAKE 1 TABLET BY MOUTH EVERY DAY  Patient taking differently: Take 50 mg by mouth daily.   Caren Macadam, MD 09/19/2020 morning Active            Med Note Alesia Banda, CHASIE F   Thu Sep 20, 2020 11:00 AM)    Multiple Vitamin (MULTIVITAMIN WITH MINERALS) TABS tablet [id number] Yes Take 1 tablet by mouth daily. Bary Leriche, PA-C Past Week Unknown time Active Self  omeprazole (PRILOSEC) 40 MG capsule [id number] Yes Take 40 mg by mouth daily. [provider] 09/19/2020 Unknown time Active            Med Note Fransico Him Sep 20, 2020 11:01 AM)    Theadora Rama LANCETS MISC [id number]  Test twice daily. Marletta Lor, MD  Active Self  [facility] VERIO test strip [id number]  TEST TWICE A DAY. DX E11.9 Caren Macadam, MD  Active   oxyCODONE-acetaminophen (PERCOCET/ROXICET) 5-325 MG tablet [id number] Yes Take by mouth every 4 (four) hours as needed for severe pain. [provider] Past Week Unknown time Active            Med Note (SUTPHIN, CHASIE F   Thu Sep 20, 2020 11:01 AM)    pioglitazone (ACTOS) 45 MG tablet [id number] Yes TAKE 1 TABLET BY MOUTH EVERY DAY  Patient taking differently: Take 45 mg by mouth daily.   Caren Macadam, MD 09/18/2020 Unknown time Active            Med Note (SUTPHIN, CHASIE F   Thu Sep 20, 2020 11:01 AM)    potassium chloride (MICRO-K) 10 MEQ CR capsule [id number] Yes TAKE 1 CAPSULE TWICE A DAY X 3 DAYS, THEN TAKE WITH USE OF LASIX  Patient taking differently: Take 10 mEq by mouth daily.   Caren Macadam, MD 09/18/2020 Unknown time Active            Med Note (SUTPHIN, CHASIE F   Thu Sep 20, 2020 11:02 AM)    pravastatin (PRAVACHOL) 40 MG tablet [id number] Yes Take 1 tablet (40 mg total) by mouth daily. Caren Macadam, MD 09/18/2020  Unknown time Active            Med Note (SUTPHIN, CHASIE F   Thu Sep 20, 2020 11:02 AM)    prednisoLONE acetate (PRED FORTE) 1 % ophthalmic suspension [id number] Yes Place 1 drop into both eyes See admin instructions. Place 1 drop into left eye three times daily Place 1 drop into right eye once daily [provider] Past Week Unknown time Active Self           Med Note Maud Deed   Wed Sep 19, 2020  6:10 PM) LF : 08/21/20 30 day supply  Probiotic Product (PROBIOTIC-10 PO) [id number] Yes Take 1 capsule by mouth daily. [provider] Past Week Unknown time Active Self  senna-docusate (SENOKOT-S) 8.6-50 MG tablet [id number] Yes Take 1 tablet by mouth at bedtime. Reesa Chew  S, PA-C Past Week Unknown time Active Self  sitaGLIPtin (JANUVIA) 100 MG tablet [id number] No Take 1 tablet (100 mg total) by mouth daily.  Patient not taking: Reported on 09/20/2020   Caren Macadam, MD Not Taking Unknown time Active            Med Note Marcell Barlow   Thu Sep 20, 2020 11:02 AM)    spironolactone (ALDACTONE) 25 MG tablet [id number] Yes TAKE 1/2 TABLET BY MOUTH EVERY DAY  Patient taking differently: Take 12.5 mg by mouth daily.   Caren Macadam, MD 09/19/2020 Unknown time Active            Med Note Marcell Barlow   Thu Sep 20, 2020 11:03 AM)            Patient Active Problem List   Diagnosis Date Noted  . 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  . Allergic rhinitis due to animal (cat) (dog) hair and dander 07/18/2020  . Leiomyoma 07/18/2020  . 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 Anxiety state   . Acute blood loss anemia   . Postoperative pain   . Cervical myelopathy ([place]) 09/22/2019  . S/P cervical spinal fusion 09/22/2019  . 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 AKI (acute kidney injury) ([place]) 09/14/2019  . GERD (gastroesophageal reflux disease) 09/14/2019  . Spinal  stenosis in cervical region 09/14/2019  . Weakness 09/14/2019  . Numbness and tingling 09/14/2019  . Cervical spinal stenosis 09/14/2019  . Status post cataract extraction and insertion of intraocular lens of left eye 09/04/2019  . Lumbar stenosis 08/24/2019  . Allergic rhinitis due to pollen 06/06/2019  . Persistent cough 06/06/2019  . History of Descemet membrane endothelial keratoplasty (DMEK) 01/04/2019  . Status post cataract extraction and insertion of intraocular lens of right eye 01/04/2019  . Senile nuclear sclerosis 01/02/2019  . 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 Obesity, morbid, BMI 40.0-49.9 ([place]) 04/19/2018  . Morbid obesity ([place]) 04/19/2018  . Cataract   . Fibroid   . Endometriosis   . 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 Dyslipidemia 06/17/2010  . Microcytic anemia 06/17/2010  . Essential hypertension 06/17/2010  . Allergic rhinitis 06/17/2010  . Osteoarthritis 06/17/2010  . LOW BACK PAIN 06/17/2010  . OSTEOPENIA 06/17/2010  . BREAST CANCER, HX OF 06/17/2010  . Disorder of skeletal system 06/17/2010    Immunization History  Administered Date(s) Administered  . Fluad Quad(high Dose 65+) 04/28/2019, 03/21/2020  . Influenza Split 04/30/2011, 04/28/2012, 04/13/2013  . Influenza Whole 04/02/2010  . Influenza, High Dose Seasonal PF 04/10/2015, 04/16/2016, 12/10/2016, 04/13/2017, 04/15/2018, 08/04/2018, 04/19/2019, 04/18/2020  . Influenza,inj,Quad PF,6+ Mos 04/06/2014  . PFIZER(Purple Top)SARS-COV-2 Vaccination 08/01/2019, 08/14/2019, 04/18/2020  . PPD Test 10/15/2010, 10/15/2010, 10/17/2010, 10/27/2011, 12/13/2012, 11/13/2015, 04/25/2019  . Pneumococcal Conjugate-13 07/12/2014  . Pneumococcal Polysaccharide-23 12/10/2016, 12/25/2017, 03/23/2019, 04/19/2019, 04/18/2020    Conditions to be addressed/monitored:  Hypertension, Hyperlipidemia, Diabetes, GERD,  Anxiety, Osteopenia, Osteoarthritis and Allergic Rhinitis  Care Plan : CCM Pharmacy Care Plan  Updates made by Viona Gilmore, [place] since 11/05/2020 12:00 AM    Problem: Problem: Hypertension, Hyperlipidemia, Diabetes, GERD, Anxiety, Osteopenia, Osteoarthritis and Allergic Rhinitis     Long-Range Goal: Patient-Specific Goal   Start Date: 10/29/2020  Expected End Date: 10/29/2021  This Visit's Progress: On track  Priority: High  Note:   Current Barriers:  .  Unable to independently monitor therapeutic efficacy . Unable to achieve control of cholesterol   Pharmacist Clinical Goal(s):  Marland Kitchen Patient will achieve adherence to monitoring guidelines and medication adherence to achieve therapeutic efficacy . achieve control of cholesterol as evidenced by next lipid panel  through collaboration with PharmD and provider.   Interventions: . 1:1 collaboration with Caren Macadam, MD regarding development and update of comprehensive plan of care as evidenced by provider attestation and co-signature . Inter-disciplinary care team collaboration (see longitudinal plan of care) . Comprehensive medication review performed; medication list updated in electronic medical record  Hypertension (BP goal <140/90) -Uncontrolled -Current treatment:  Benazepril 40 mg daily   Hydrochlorothiazide 12.5 mg daily    Metoprolol Succinate 50 mg daily   Spironolactone 25 mg 1/2 tablet daily - stopped taking it a few weeks ago  -Medications previously tried: amlodipine (hypotension) -Current home readings: 130/70s (checking a couple times a week) -Current dietary habits: increased water intake -Current exercise habits: did not discuss -Denies hypotensive/hypertensive symptoms -Educated on BP goals and benefits of medications for prevention of heart attack, stroke and kidney damage; Importance of home blood pressure monitoring; Proper BP monitoring technique; Symptoms of hypotension and importance of maintaining  adequate hydration; -Counseled to monitor BP at home weekl, document, and provide log at future appointments -Counseled on diet and exercise extensively Recommended to continue current medication  Hyperlipidemia: (LDL goal < 70) -Uncontrolled -Current treatment: . Pravastatin 40 mg daily at bedtime -Medications previously tried: none  -Current dietary patterns: did not discuss -Current exercise habits: did not discuss -Educated on Cholesterol goals;  Importance of limiting foods high in cholesterol; Exercise goal of 150 minutes per week; -Counseled on diet and exercise extensively Recommended switch to high intensity statin for better LDL lowering  Diabetes (A1c goal <7%) -Controlled -Current medications:  Glipizide XL 5 mg daily (AM)  Metformin XR 500 mg 1 tablet twice daily - adjusts or takes it in the middle of the day  Pioglitazone 45 mg daily (AM)  -Medications previously tried: Januvia (low blood sugars) -Current home glucose readings . fasting glucose: 120,90 (lowest) . post prandial glucose: 150 (1 hour after dinner) -Denies hypoglycemic/hyperglycemic symptoms -Current meal patterns:  . breakfast: did not discuss . lunch: did not discuss  . dinner: did not discuss  . snacks: limiting fried foods and sweets . drinks: did not discuss  -Current exercise: did not discuss -Educated on A1c and blood sugar goals; Prevention and management of hypoglycemic episodes; Benefits of routine self-monitoring of blood sugar; Carbohydrate counting and/or plate method -Counseled to check feet daily and get yearly eye exams -Counseled on diet and exercise extensively Recommended to continue current medication  Anxiety/spinal pain (Goal: minimize symptoms) -Controlled -Current treatment: . Duloxetine 20 mg 1 capsule every other day -Medications previously tried/failed: n/a -PHQ9: 0 -GAD7: n/a -Educated on Benefits of medication for symptom control -Recommended to continue  current medication  Osteoporosis (Goal prevent fractures) -Uncontrolled -Last DEXA Scan: 08/31/2018  T-Score femoral neck: -2.5  T-Score total hip:n/a  T-Score lumbar spine: -0.2  T-Score forearm radius: n/a  10-year probability of major osteoporotic fracture: n/a  10-year probability of hip fracture: n/a -Patient is a candidate for pharmacologic treatment due to T-Score < -2.5 in femoral neck -Current treatment  . Vitamin D 4000 units daily -Medications previously tried: none  -Recommend [phone] units of vitamin D daily. Recommend 1200 mg of calcium daily from dietary and supplemental sources. Recommend weight-bearing and muscle strengthening exercises for building and  maintaining bone density. -Recommended to continue current medication Recommended repeat DEXA and vitamin D level  GERD (Goal: minimize symptoms) -Controlled -Current treatment   Famotidine 20 mg at bedtime   Gaviscon Extra Relief 10 mL at bedtime  -Medications previously tried: omeprazole (not needed); famotidine 40 mg (dizziness)  -Recommended to continue current medication  Persistent cough (Goal: minimize coughing) -Controlled -Current treatment   Flonase 50 mcg/act 1-2 spray daily as needed  Advair 250-50 mcg  1 puff twice daily  Incentive spirometer -Medications previously tried: none  -Recommended to continue current medication   Health Maintenance -Vaccine gaps: shingrix -Current therapy:   APAP 500 mg as needed   Vitamin C 1000 mg daily   Vitamin D 2000 units daily   Lotrisone Cream  Vitamin B12 2000 mcg daily   Epinephrine   Ferrex 150 mg daily as needed  Fexofenadine 180 mg daily as needed (1/2 tablet)  Multivitamin daily   Prednisolone 1% solution   Probiotic daily   Senna-Docusate 8.6-50 mg as needed -Educated on Cost vs benefit of each product must be carefully weighed by individual consumer -Patient is satisfied with current therapy and denies issues -Recommended to  continue current medication  Patient Goals/Self-Care Activities . Patient will:  - take medications as prescribed check glucose daily, document, and provide at future appointments check blood pressure weekly, document, and provide at future appointments target a minimum of 150 minutes of moderate intensity exercise weekly  Follow Up Plan: Telephone follow up appointment with care management team member scheduled for: 3 months        Medication Assistance: None required.  Patient affirms current coverage meets needs.  Patient's preferred pharmacy is:  [facility] - [address], [place] [address] Phone: [phone] Fax: [place], [address] [address] Phone: [phone] Fax: [phone]  [id number] [place]: [phone]: [phone] Uses pill box? Yes  Pt endorses 99% compliance   We discussed: Current pharmacy is preferred with insurance plan and patient is satisfied with pharmacy services Patient decided to: Continue current medication management strategy  Care Plan and Follow Up Patient Decision:  Patient agrees to Care Plan and Follow-up.  Plan: Telephone follow up appointment with care management team member scheduled for:  3 months  MJeni Salles PharmD BCreightonPharmacist LShelburnat [phone]

## 2020-10-30 DIAGNOSIS — E119 Type 2 diabetes mellitus without complications: Secondary | ICD-10-CM | POA: Diagnosis not present

## 2020-10-30 DIAGNOSIS — J449 Chronic obstructive pulmonary disease, unspecified: Secondary | ICD-10-CM | POA: Diagnosis not present

## 2020-10-30 DIAGNOSIS — S42201D Unspecified fracture of upper end of right humerus, subsequent encounter for fracture with routine healing: Secondary | ICD-10-CM | POA: Diagnosis not present

## 2020-10-30 DIAGNOSIS — E785 Hyperlipidemia, unspecified: Secondary | ICD-10-CM | POA: Diagnosis not present

## 2020-10-30 DIAGNOSIS — I10 Essential (primary) hypertension: Secondary | ICD-10-CM | POA: Diagnosis not present

## 2020-10-30 DIAGNOSIS — M1990 Unspecified osteoarthritis, unspecified site: Secondary | ICD-10-CM | POA: Diagnosis not present

## 2020-10-30 DIAGNOSIS — D649 Anemia, unspecified: Secondary | ICD-10-CM | POA: Diagnosis not present

## 2020-10-30 DIAGNOSIS — Z947 Corneal transplant status: Secondary | ICD-10-CM | POA: Diagnosis not present

## 2020-10-30 DIAGNOSIS — Z981 Arthrodesis status: Secondary | ICD-10-CM | POA: Diagnosis not present

## 2020-10-30 DIAGNOSIS — K219 Gastro-esophageal reflux disease without esophagitis: Secondary | ICD-10-CM | POA: Diagnosis not present

## 2020-10-31 DIAGNOSIS — J3089 Other allergic rhinitis: Secondary | ICD-10-CM | POA: Diagnosis not present

## 2020-10-31 DIAGNOSIS — J301 Allergic rhinitis due to pollen: Secondary | ICD-10-CM | POA: Diagnosis not present

## 2020-11-01 DIAGNOSIS — K219 Gastro-esophageal reflux disease without esophagitis: Secondary | ICD-10-CM | POA: Diagnosis not present

## 2020-11-01 DIAGNOSIS — S42201D Unspecified fracture of upper end of right humerus, subsequent encounter for fracture with routine healing: Secondary | ICD-10-CM | POA: Diagnosis not present

## 2020-11-01 DIAGNOSIS — M1990 Unspecified osteoarthritis, unspecified site: Secondary | ICD-10-CM | POA: Diagnosis not present

## 2020-11-01 DIAGNOSIS — D649 Anemia, unspecified: Secondary | ICD-10-CM | POA: Diagnosis not present

## 2020-11-01 DIAGNOSIS — E119 Type 2 diabetes mellitus without complications: Secondary | ICD-10-CM | POA: Diagnosis not present

## 2020-11-01 DIAGNOSIS — Z947 Corneal transplant status: Secondary | ICD-10-CM | POA: Diagnosis not present

## 2020-11-01 DIAGNOSIS — E785 Hyperlipidemia, unspecified: Secondary | ICD-10-CM | POA: Diagnosis not present

## 2020-11-01 DIAGNOSIS — J449 Chronic obstructive pulmonary disease, unspecified: Secondary | ICD-10-CM | POA: Diagnosis not present

## 2020-11-01 DIAGNOSIS — Z981 Arthrodesis status: Secondary | ICD-10-CM | POA: Diagnosis not present

## 2020-11-01 DIAGNOSIS — I10 Essential (primary) hypertension: Secondary | ICD-10-CM | POA: Diagnosis not present

## 2020-11-03 ENCOUNTER — Other Ambulatory Visit: Payer: Self-pay | Admitting: Family Medicine

## 2020-11-05 DIAGNOSIS — Z947 Corneal transplant status: Secondary | ICD-10-CM | POA: Diagnosis not present

## 2020-11-05 DIAGNOSIS — J449 Chronic obstructive pulmonary disease, unspecified: Secondary | ICD-10-CM | POA: Diagnosis not present

## 2020-11-05 DIAGNOSIS — K219 Gastro-esophageal reflux disease without esophagitis: Secondary | ICD-10-CM | POA: Diagnosis not present

## 2020-11-05 DIAGNOSIS — E119 Type 2 diabetes mellitus without complications: Secondary | ICD-10-CM | POA: Diagnosis not present

## 2020-11-05 DIAGNOSIS — Z981 Arthrodesis status: Secondary | ICD-10-CM | POA: Diagnosis not present

## 2020-11-05 DIAGNOSIS — S42201D Unspecified fracture of upper end of right humerus, subsequent encounter for fracture with routine healing: Secondary | ICD-10-CM | POA: Diagnosis not present

## 2020-11-05 DIAGNOSIS — M1990 Unspecified osteoarthritis, unspecified site: Secondary | ICD-10-CM | POA: Diagnosis not present

## 2020-11-05 DIAGNOSIS — E785 Hyperlipidemia, unspecified: Secondary | ICD-10-CM | POA: Diagnosis not present

## 2020-11-05 DIAGNOSIS — I10 Essential (primary) hypertension: Secondary | ICD-10-CM | POA: Diagnosis not present

## 2020-11-05 DIAGNOSIS — D649 Anemia, unspecified: Secondary | ICD-10-CM | POA: Diagnosis not present

## 2020-11-07 DIAGNOSIS — E119 Type 2 diabetes mellitus without complications: Secondary | ICD-10-CM | POA: Diagnosis not present

## 2020-11-07 DIAGNOSIS — J449 Chronic obstructive pulmonary disease, unspecified: Secondary | ICD-10-CM | POA: Diagnosis not present

## 2020-11-07 DIAGNOSIS — Z981 Arthrodesis status: Secondary | ICD-10-CM | POA: Diagnosis not present

## 2020-11-07 DIAGNOSIS — D649 Anemia, unspecified: Secondary | ICD-10-CM | POA: Diagnosis not present

## 2020-11-07 DIAGNOSIS — E785 Hyperlipidemia, unspecified: Secondary | ICD-10-CM | POA: Diagnosis not present

## 2020-11-07 DIAGNOSIS — K219 Gastro-esophageal reflux disease without esophagitis: Secondary | ICD-10-CM | POA: Diagnosis not present

## 2020-11-07 DIAGNOSIS — S42201D Unspecified fracture of upper end of right humerus, subsequent encounter for fracture with routine healing: Secondary | ICD-10-CM | POA: Diagnosis not present

## 2020-11-07 DIAGNOSIS — Z947 Corneal transplant status: Secondary | ICD-10-CM | POA: Diagnosis not present

## 2020-11-07 DIAGNOSIS — M1990 Unspecified osteoarthritis, unspecified site: Secondary | ICD-10-CM | POA: Diagnosis not present

## 2020-11-07 DIAGNOSIS — I10 Essential (primary) hypertension: Secondary | ICD-10-CM | POA: Diagnosis not present

## 2020-11-08 ENCOUNTER — Telehealth: Payer: Self-pay | Admitting: Family Medicine

## 2020-11-08 DIAGNOSIS — E785 Hyperlipidemia, unspecified: Secondary | ICD-10-CM | POA: Diagnosis not present

## 2020-11-08 DIAGNOSIS — K219 Gastro-esophageal reflux disease without esophagitis: Secondary | ICD-10-CM | POA: Diagnosis not present

## 2020-11-08 DIAGNOSIS — D649 Anemia, unspecified: Secondary | ICD-10-CM | POA: Diagnosis not present

## 2020-11-08 DIAGNOSIS — M1990 Unspecified osteoarthritis, unspecified site: Secondary | ICD-10-CM | POA: Diagnosis not present

## 2020-11-08 DIAGNOSIS — E119 Type 2 diabetes mellitus without complications: Secondary | ICD-10-CM | POA: Diagnosis not present

## 2020-11-08 DIAGNOSIS — I10 Essential (primary) hypertension: Secondary | ICD-10-CM | POA: Diagnosis not present

## 2020-11-08 DIAGNOSIS — S42201D Unspecified fracture of upper end of right humerus, subsequent encounter for fracture with routine healing: Secondary | ICD-10-CM | POA: Diagnosis not present

## 2020-11-08 DIAGNOSIS — Z981 Arthrodesis status: Secondary | ICD-10-CM | POA: Diagnosis not present

## 2020-11-08 DIAGNOSIS — J449 Chronic obstructive pulmonary disease, unspecified: Secondary | ICD-10-CM | POA: Diagnosis not present

## 2020-11-08 DIAGNOSIS — Z947 Corneal transplant status: Secondary | ICD-10-CM | POA: Diagnosis not present

## 2020-11-08 MED ORDER — POLYSACCHARIDE IRON COMPLEX 150 MG PO CAPS: 150.0000 mg | ORAL_CAPSULE | Freq: Every day | ORAL | 0 refills | Status: DC

## 2020-11-08 MED ORDER — METOPROLOL SUCCINATE ER 50 MG PO TB24: 50.0000 mg | ORAL_TABLET | Freq: Every day | ORAL | 0 refills | Status: DC

## 2020-11-08 NOTE — Telephone Encounter (Signed)
Sent to PCP as last Rx for Metformin was given by ortho.

## 2020-11-08 NOTE — Telephone Encounter (Signed)
The patient needs refills on   FERREX 150 150 MG capsule  metFORMIN (GLUCOPHAGE XR) 500 MG 24 hr tablet(Expired)  metoprolol succinate (TOPROL-XL) 50 MG 24 hr tablet  [place], [place] [address] Phone:  [phone]  Fax:  [phone]

## 2020-11-09 ENCOUNTER — Other Ambulatory Visit: Payer: Self-pay | Admitting: Family Medicine

## 2020-11-09 MED ORDER — METFORMIN HCL ER 500 MG PO TB24: 500.0000 mg | ORAL_TABLET | Freq: Two times a day (BID) | ORAL | 1 refills | Status: DC

## 2020-11-09 NOTE — Telephone Encounter (Signed)
Patient informed. 

## 2020-11-09 NOTE — Telephone Encounter (Signed)
The patient is wanting to know if her Metformin can be filled today because she really needs her medication.  Please advise

## 2020-11-09 NOTE — Telephone Encounter (Signed)
Metformin sent.

## 2020-11-13 ENCOUNTER — Other Ambulatory Visit: Payer: Self-pay

## 2020-11-13 ENCOUNTER — Ambulatory Visit (INDEPENDENT_AMBULATORY_CARE_PROVIDER_SITE_OTHER): Payer: Medicare HMO

## 2020-11-13 DIAGNOSIS — I10 Essential (primary) hypertension: Secondary | ICD-10-CM | POA: Diagnosis not present

## 2020-11-13 DIAGNOSIS — E785 Hyperlipidemia, unspecified: Secondary | ICD-10-CM | POA: Diagnosis not present

## 2020-11-13 DIAGNOSIS — K219 Gastro-esophageal reflux disease without esophagitis: Secondary | ICD-10-CM | POA: Diagnosis not present

## 2020-11-13 DIAGNOSIS — Z0000 Encounter for general adult medical examination without abnormal findings: Secondary | ICD-10-CM

## 2020-11-13 DIAGNOSIS — Z981 Arthrodesis status: Secondary | ICD-10-CM | POA: Diagnosis not present

## 2020-11-13 DIAGNOSIS — E119 Type 2 diabetes mellitus without complications: Secondary | ICD-10-CM | POA: Diagnosis not present

## 2020-11-13 DIAGNOSIS — M1990 Unspecified osteoarthritis, unspecified site: Secondary | ICD-10-CM | POA: Diagnosis not present

## 2020-11-13 DIAGNOSIS — Z947 Corneal transplant status: Secondary | ICD-10-CM | POA: Diagnosis not present

## 2020-11-13 DIAGNOSIS — D649 Anemia, unspecified: Secondary | ICD-10-CM | POA: Diagnosis not present

## 2020-11-13 DIAGNOSIS — S42201D Unspecified fracture of upper end of right humerus, subsequent encounter for fracture with routine healing: Secondary | ICD-10-CM | POA: Diagnosis not present

## 2020-11-13 DIAGNOSIS — J449 Chronic obstructive pulmonary disease, unspecified: Secondary | ICD-10-CM | POA: Diagnosis not present

## 2020-11-13 NOTE — Progress Notes (Addendum)
Virtual Visit via Telephone Note  I connected with  Doris Jackson on 11/13/20 at  3:15 PM EDT by telephone and verified that I am speaking with the correct person using two identifiers.  Medicare Annual Wellness visit completed telephonically due to Covid-19 pandemic.   Persons participating in this call: This Health Coach and this patient.   Location: Patient: Home Provider: Office    I discussed the limitations, risks, security and privacy concerns of performing an evaluation and management service by telephone and the availability of in person appointments. The patient expressed understanding and agreed to proceed.  Unable to perform video visit due to video visit attempted and failed and/or patient does not have video capability.   Some vital signs may be absent or patient reported.   Willette Brace, LPN    Subjective:   Doris Jackson is a 80 y.o. female who presents for Medicare Annual (Subsequent) preventive examination.  Review of Systems           Objective:    Today's Vitals   11/13/20 1512  PainSc: 4    There is no height or weight on file to calculate BMI.  Advanced Directives 11/13/2020 09/18/2020 09/13/2020 12/30/2019 10/21/2019 09/22/2019 09/16/2019  Does Patient Have a Medical Advance Directive? No No No No No No No  Would patient like information on creating a medical advance directive? No - Patient declined - - No - Patient declined No - Patient declined No - Patient declined No - Patient declined    Current Medications (verified) Outpatient Encounter Medications as of 11/13/2020  Medication Sig   benazepril (LOTENSIN) 40 MG tablet TAKE 1 TABLET BY MOUTH EVERY DAY   blood glucose meter kit and supplies KIT Dispense based on patient and insurance preference. Use up to four times daily as directed. (FOR ICD-9 250.00, 250.01).   Blood Glucose Monitoring Suppl (ONETOUCH VERIO REFLECT) w/Device KIT USE UP TO FOUR TIMES DAILY AS DIRECTED.   Cholecalciferol  (VITAMIN D) 2000 UNITS CAPS Take 4,000 Units by mouth daily.    cyanocobalamin 2000 MCG tablet Take 2,000 mcg by mouth daily.   fluticasone (FLONASE) 50 MCG/ACT nasal spray Place 1 spray into both nostrils daily.   glipiZIDE (GLUCOTROL XL) 5 MG 24 hr tablet TAKE 1 TABLET BY MOUTH EVERY DAY   hydrochlorothiazide (HYDRODIURIL) 12.5 MG tablet Take 1 tablet (12.5 mg total) by mouth daily.   iron polysaccharides (FERREX 150) 150 MG capsule Take 1 capsule (150 mg total) by mouth daily.   Lancets 30G MISC USE TWICE DAILY AS DIRECTED FOR GLUCOSE TESTING AND MONITORING   metFORMIN (GLUCOPHAGE XR) 500 MG 24 hr tablet Take 1 tablet (500 mg total) by mouth 2 (two) times daily.   metoprolol succinate (TOPROL-XL) 50 MG 24 hr tablet Take 1 tablet (50 mg total) by mouth daily. Take with or immediately following a meal.   ONE TOUCH LANCETS MISC Test twice daily.   ONETOUCH VERIO test strip TEST TWICE A DAY. DX E11.9   pioglitazone (ACTOS) 45 MG tablet TAKE 1 TABLET BY MOUTH EVERY DAY   pravastatin (PRAVACHOL) 40 MG tablet Take 1 tablet (40 mg total) by mouth daily.   prednisoLONE acetate (PRED FORTE) 1 % ophthalmic suspension Place 1 drop into both eyes See admin instructions. Place 1 drop into left eye three times daily Place 1 drop into right eye once daily   Probiotic Product (PROBIOTIC-10 PO) Take 1 capsule by mouth daily.   Ascorbic Acid (VITAMIN C) 1000 MG  tablet Take 1,000 mg by mouth daily. (Patient not taking: Reported on 11/13/2020)   famotidine (PEPCID) 20 MG tablet Take 1 tablet (20 mg total) by mouth daily.   fexofenadine (ALLEGRA) 180 MG tablet Take 180 mg by mouth daily as needed for allergies. (Patient not taking: Reported on 11/13/2020)   senna-docusate (SENOKOT-S) 8.6-50 MG tablet Take 1 tablet by mouth at bedtime. (Patient not taking: Reported on 11/13/2020)   spironolactone (ALDACTONE) 25 MG tablet Take 0.5 tablets (12.5 mg total) by mouth daily.   [DISCONTINUED] ADVAIR DISKUS 250-50 MCG/DOSE  AEPB Inhale 1 puff into the lungs 2 (two) times daily. (Patient not taking: Reported on 11/13/2020)   [DISCONTINUED] DULoxetine (CYMBALTA) 20 MG capsule Take 1 capsule (20 mg total) by mouth every other day.   [DISCONTINUED] famotidine (PEPCID) 40 MG tablet Take 40 mg by mouth at bedtime.   [DISCONTINUED] Multiple Vitamin (MULTIVITAMIN WITH MINERALS) TABS tablet Take 1 tablet by mouth daily. (Patient not taking: Reported on 11/13/2020)   [DISCONTINUED] oxyCODONE (OXY IR/ROXICODONE) 5 MG immediate release tablet Take 1 pills every 8 hrs as needed for severe pain (Patient not taking: Reported on 11/13/2020)   [DISCONTINUED] rivaroxaban (XARELTO) 10 MG TABS tablet Take 1 tablet (10 mg total) by mouth daily. For DVT prophylaxis after surgery   No facility-administered encounter medications on file as of 11/13/2020.    Allergies (verified) Dust mite extract and Latex   History: Past Medical History:  Diagnosis Date   ALLERGIC RHINITIS 06/17/2010   ANEMIA-NOS 06/17/2010   BREAST CANCER, HX OF 06/17/2010   Breast cancer, right ([place]) 2008   Cataract    COPD 06/17/2010   pt denies   DIABETES MELLITUS, TYPE II 06/17/2010   Endometriosis    Fibroid    FIBROID   GERD (gastroesophageal reflux disease)    HYPERLIPIDEMIA 06/17/2010   HYPERTENSION 06/17/2010   Leg swelling    LOW BACK PAIN 06/17/2010   OSTEOARTHRITIS 06/17/2010   OSTEOPENIA 06/17/2010   Weakness    Past Surgical History:  Procedure Laterality Date   ABDOMINAL HYSTERECTOMY  1988   TAH,LSO   ANTERIOR CERVICAL DECOMP/DISCECTOMY FUSION N/A 09/15/2019   Procedure: Cervical five CorpectomyANTERIOR CERVICAL DECOMPRESSION/DISCECTOMY FUSION CERVICAL THREE-CERVICAL Four;  Surgeon: Ashok Pall, MD;  Location: [facility];  Service: Neurosurgery;  Laterality: N/A;   BREAST LUMPECTOMY Right 2007   LUMPECTOMY FOLLOWED BY RADIATION   COLONOSCOPY     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   EYE SURGERY Bilateral     Cataract   HARDWARE REMOVAL N/A 10/21/2019   Procedure: Anterior cervical plate removal with revision;  Surgeon: Ashok Pall, MD;  Location: [facility];  Service: Neurosurgery;  Laterality: N/A;  3C   [place]azer Bilateral    OOPHORECTOMY  1988   TAH,LSO   REVERSE SHOULDER ARTHROPLASTY Right 09/19/2020   Procedure: REVERSE SHOULDER ARTHROPLASTY;  Surgeon: Hiram Gash, MD;  Location: [facility];  Service: Orthopedics;  Laterality: Right;   TUBAL LIGATION     Family History  Problem Relation Age of Onset   Diabetes Mother    Hypertension Sister    Asthma Father    Diabetes Brother    Social History   Socioeconomic History   Marital status: Married    Spouse name: Not on file   Number of children: Not on file   Years of education: Not on file   Highest education level: Not on file  Occupational History   Not on file  Tobacco Use  Smoking status: Never Smoker   Smokeless tobacco: Never Used  Vaping Use   Vaping Use: Never used  Substance and Sexual Activity   Alcohol use: No   Drug use: No   Sexual activity: Never    Birth control/protection: Post-menopausal, Surgical    Comment: Tubal Ligation  Other Topics Concern   Not on file  Social History Narrative   Not on file   Social Determinants of Health   Financial Resource Strain: Low Risk    Difficulty of Paying Living Expenses: Not very hard  Food Insecurity: No Food Insecurity   Worried About Charity fundraiser in the Last Year: Never true   Ran Out of Food in the Last Year: Never true  Transportation Needs: No Transportation Needs   Lack of Transportation (Medical): No   Lack of Transportation (Non-Medical): No  Physical Activity: Insufficiently Active   Days of Exercise per Week: 3 days   Minutes of Exercise per Session: 30 min  Stress: No Stress Concern Present   Feeling of Stress : Not at all  Social Connections: Moderately Isolated   Frequency of Communication with Friends and Family: Twice a week   Frequency of  Social Gatherings with Friends and Family: Twice a week   Attends Religious Services: Never   Printmaker: No   Attends Music therapist: Never   Marital Status: Married    Tobacco Counseling Counseling given: Not Answered   Clinical Intake:  Pre-visit preparation completed: Yes  Pain : 0-10 Pain Score: 4  Pain Type: Chronic pain Pain Location: Back Pain Orientation: Lower Pain Descriptors / Indicators: Aching Pain Onset: More than a month ago Pain Frequency: Intermittent     BMI - recorded: 41.36 Nutritional Status: BMI > 30  Obese Nutritional Risks: None Diabetes: Yes CBG done?: Yes (93) CBG resulted in Enter/ Edit results?: No Did pt. bring in CBG monitor from home?: No  How often do you need to have someone help you when you read instructions, pamphlets, or other written materials from your doctor or pharmacy?: 1 - Never  Diabetic?Nutrition Risk Assessment:  Has the patient had any N/V/D within the last 2 months?  No  Does the patient have any non-healing wounds?  No  Has the patient had any unintentional weight loss or weight gain?  No   Diabetes:  Is the patient diabetic?  Yes  If diabetic, was a CBG obtained today?  Yes  Did the patient bring in their glucometer from home?  No  How often do you monitor your CBG's? daily.   Financial Strains and Diabetes Management:  Are you having any financial strains with the device, your supplies or your medication? No .  Does the patient want to be seen by Chronic Care Management for management of their diabetes?  No  Would the patient like to be referred to a Nutritionist or for Diabetic Management?  No   Diabetic Exams:  Diabetic Eye Exam: Completed 06/05/20 Diabetic Foot Exam: Overdue, Pt has been advised about the importance in completing this exam. Pt is scheduled for diabetic foot exam on pt has appt 6/15.   Interpreter Needed?: No  Information entered by :: Charlott Rakes, LPN   Activities of Daily Living In your present state of health, do you have any difficulty performing the following activities: 11/13/2020 09/19/2020  Hearing? N N  Vision? N N  Difficulty concentrating or making decisions? Y N  Comment memory at times -  Walking or climbing stairs? N Y  Dressing or bathing? N N  Doing errands, shopping? N -  Preparing Food and eating ? N -  Using the Toilet? N -  In the past six months, have you accidently leaked urine? Y -  Comment wears pad -  Do you have problems with loss of bowel control? N -  Managing your Medications? N -  Managing your Finances? N -  Housekeeping or managing your Housekeeping? N -  Some recent data might be hidden    Patient Care Team: Caren Macadam, MD as PCP - General (Family Medicine) Viona Gilmore, [facility] as Pharmacist (Pharmacist)  Indicate any recent Medical Services you may have received from other than [facility] providers in the past year (date may be approximate).     Assessment:   This is a routine wellness examination for [facility].  Hearing/Vision screen  Hearing Screening   '125Hz'  '250Hz'  '500Hz'  '1000Hz'  '2000Hz'  '3000Hz'  '4000Hz'  '6000Hz'  '8000Hz'   Right ear:           Left ear:           Comments: Pt denies any hearing issues   Dietary issues and exercise activities discussed: Current Exercise Habits: Structured exercise class  Goals Addressed             This Visit's Progress    Patient Stated       Keep up with therapy        Depression Screen PHQ 2/9 Scores 11/13/2020 05/25/2018 04/19/2018 02/26/2016 07/06/2014 01/28/2013  PHQ - 2 Score 0 0 0 0 0 0    Fall Risk Fall Risk  11/13/2020 05/25/2018 04/19/2018 02/26/2016 07/06/2014  Falls in the past year? 1 0 No Yes Yes  Number falls in past yr: 1 0 - 1 1  Injury with Fall? 1 0 - - No  Comment soulder surgery - - - -  Risk for fall due to : History of fall(s);Impaired balance/gait;Impaired mobility;Impaired vision - - - Impaired balance/gait   Follow up Falls prevention discussed - - - -    FALL RISK PREVENTION PERTAINING TO THE HOME:  Any stairs in or around the home? Yes  If so, are there any without handrails? No  Home free of loose throw rugs in walkways, pet beds, electrical cords, etc? Yes  Adequate lighting in your home to reduce risk of falls? Yes   ASSISTIVE DEVICES UTILIZED TO PREVENT FALLS:  Life alert? No  Use of a cane, walker or w/c? Yes  Grab bars in the bathroom? No  Shower chair or bench in shower? Yes  Elevated toilet seat or a handicapped toilet? No   TIMED UP AND GO:  Was the test performed? No .      Cognitive Function:     6CIT Screen 11/13/2020  What Year? 0 points  What month? 0 points  Count back from 20 0 points  Months in reverse 0 points  Repeat phrase 10 points    Immunizations Immunization History  Administered Date(s) Administered   Fluad Quad(high Dose 65+) 04/28/2019, 03/21/2020   Influenza Split 04/30/2011, 04/28/2012, 04/13/2013   Influenza Whole 04/02/2010   Influenza, High Dose Seasonal PF 04/10/2015, 04/16/2016, 12/10/2016, 04/13/2017, 04/15/2018, 08/04/2018, 04/19/2019, 04/18/2020   Influenza,inj,Quad PF,6+ Mos 04/06/2014   PFIZER(Purple Top)SARS-COV-2 Vaccination 08/01/2019, 08/14/2019, 04/18/2020   PPD Test 10/15/2010, 10/15/2010, 10/17/2010, 10/27/2011, 12/13/2012, 11/13/2015, 04/25/2019   Pneumococcal Conjugate-13 07/12/2014   Pneumococcal Polysaccharide-23 12/10/2016, 12/25/2017, 03/23/2019, 04/19/2019, 04/18/2020    TDAP status: Due,  Education has been provided regarding the importance of this vaccine. Advised may receive this vaccine at local pharmacy or Health Dept. Aware to provide a copy of the vaccination record if obtained from local pharmacy or Health Dept. Verbalized acceptance and understanding.  Flu Vaccine status: Up to date  Pneumococcal vaccine status: Up to date  Covid-19 vaccine status: Completed vaccines  Qualifies for Shingles Vaccine?  Yes   Zostavax completed No   Shingrix Completed?: No.    Education has been provided regarding the importance of this vaccine. Patient has been advised to call insurance company to determine out of pocket expense if they have not yet received this vaccine. Advised may also receive vaccine at local pharmacy or Health Dept. Verbalized acceptance and understanding.  Screening Tests Health Maintenance  Topic Date Due   TETANUS/TDAP  Never done   PAP SMEAR-Modifier  09/21/2013   FOOT EXAM  04/20/2019   MAMMOGRAM  08/27/2019   INFLUENZA VACCINE  01/28/2021   HEMOGLOBIN A1C  03/22/2021   OPHTHALMOLOGY EXAM  06/05/2021   DEXA SCAN  Completed   COVID-19 Vaccine  Completed   Hepatitis C Screening  Completed   PNA vac Low Risk Adult  Completed   HPV VACCINES  Aged Out    Health Maintenance  Health Maintenance Due  Topic Date Due   TETANUS/TDAP  Never done   PAP SMEAR-Modifier  09/21/2013   FOOT EXAM  04/20/2019   MAMMOGRAM  08/27/2019    Colorectal cancer screening: No longer required.   Mammogram status: Completed 08/26/18. Repeat every year  Bone Density status: Completed 08/26/18. Results reflect: Bone density results: OSTEOPOROSIS. Repeat every 0 years.   Additional Screening:  Hepatitis C Screening:  Completed 07/02/17  Vision Screening: Recommended annual ophthalmology exams for early detection of glaucoma and other disorders of the eye. Is the patient up to date with their annual eye exam?  Yes  Who is the provider or what is the name of the office in which the patient attends annual eye exams? Dr Nira Conn If pt is not established with a provider, would they like to be referred to a provider to establish care? No .   Dental Screening: Recommended annual dental exams for proper oral hygiene  Community Resource Referral / Chronic Care Management: CRR required this visit?  No   CCM required this visit?  No      Plan:     I have personally reviewed and noted the  following in the patient's chart:   Medical and social history Use of alcohol, tobacco or illicit drugs  Current medications and supplements including opioid prescriptions.  Functional ability and status Nutritional status Physical activity Advanced directives List of other physicians Hospitalizations, surgeries, and ER visits in previous 12 months Vitals Screenings to include cognitive, depression, and falls Referrals and appointments  In addition, I have reviewed and discussed with patient certain preventive protocols, quality metrics, and best practice recommendations. A written personalized care plan for preventive services as well as general preventive health recommendations were provided to patient.     Willette Brace, LPN   [id number]   Nurse Notes: None   I have reviewed the documentation for the AWV and Advanced Care Planning provided by the health coach and agree with their documentation. I was immediately available for any questions  Eulas Post MD [facility]

## 2020-11-13 NOTE — Patient Instructions (Addendum)
Doris Jackson , Thank you for taking time to come for your Medicare Wellness Visit. I appreciate your ongoing commitment to your health goals. Please review the following plan we discussed and let me know if I can assist you in the future.   Screening recommendations/referrals: Colonoscopy: No longer required  Mammogram: Done 08/26/18 Bone Density: Done 08/26/18 Recommended yearly ophthalmology/optometry visit for glaucoma screening and checkup Recommended yearly dental visit for hygiene and checkup  Vaccinations: Influenza vaccine: Up to date Pneumococcal vaccine: Up to date Tdap vaccine: due and discussed Shingles vaccine: Shingrix discussed. Please contact your pharmacy for coverage information.    Covid-19:Completed 2/1, 2/14, & 04/18/20  Advanced directives: Advance directive discussed with you today. Even though you declined this today please call our office should you change your mind and we can give you the proper paperwork for you to fill out.  Conditions/risks identified: keep working with PT/OT  Next appointment: Follow up in one year for your annual wellness visit    Preventive Care 65 Years and Older, Female Preventive care refers to lifestyle choices and visits with your health care provider that can promote health and wellness. What does preventive care include?  A yearly physical exam. This is also called an annual well check.  Dental exams once or twice a year.  Routine eye exams. Ask your health care provider how often you should have your eyes checked.  Personal lifestyle choices, including:  Daily care of your teeth and gums.  Regular physical activity.  Eating a healthy diet.  Avoiding tobacco and drug use.  Limiting alcohol use.  Practicing safe sex.  Taking low-dose aspirin every day.  Taking vitamin and mineral supplements as recommended by your health care provider. What happens during an annual well check? The services and screenings done by  your health care provider during your annual well check will depend on your age, overall health, lifestyle risk factors, and family history of disease. Counseling  Your health care provider may ask you questions about your:  Alcohol use.  Tobacco use.  Drug use.  Emotional well-being.  Home and relationship well-being.  Sexual activity.  Eating habits.  History of falls.  Memory and ability to understand (cognition).  Work and work Statistician.  Reproductive health. Screening  You may have the following tests or measurements:  Height, weight, and BMI.  Blood pressure.  Lipid and cholesterol levels. These may be checked every 5 years, or more frequently if you are over 53 years old.  Skin check.  Lung cancer screening. You may have this screening every year starting at age 30 if you have a 30-pack-year history of smoking and currently smoke or have quit within the past 15 years.  Fecal occult blood test (FOBT) of the stool. You may have this test every year starting at age 74.  Flexible sigmoidoscopy or colonoscopy. You may have a sigmoidoscopy every 5 years or a colonoscopy every 10 years starting at age 20.  Hepatitis C blood test.  Hepatitis B blood test.  Sexually transmitted disease (STD) testing.  Diabetes screening. This is done by checking your blood sugar (glucose) after you have not eaten for a while (fasting). You may have this done every 1-3 years.  Bone density scan. This is done to screen for osteoporosis. You may have this done starting at age 56.  Mammogram. This may be done every 1-2 years. Talk to your health care provider about how often you should have regular mammograms. Talk with your health  care provider about your test results, treatment options, and if necessary, the need for more tests. Vaccines  Your health care provider may recommend certain vaccines, such as:  Influenza vaccine. This is recommended every year.  Tetanus,  diphtheria, and acellular pertussis (Tdap, Td) vaccine. You may need a Td booster every 10 years.  Zoster vaccine. You may need this after age 42.  Pneumococcal 13-valent conjugate (PCV13) vaccine. One dose is recommended after age 68.  Pneumococcal polysaccharide (PPSV23) vaccine. One dose is recommended after age 44. Talk to your health care provider about which screenings and vaccines you need and how often you need them. This information is not intended to replace advice given to you by your health care provider. Make sure you discuss any questions you have with your health care provider. Document Released: 07/13/2015 Document Revised: 03/05/2016 Document Reviewed: 04/17/2015 Elsevier Interactive Patient Education  2017 [place] Prevention in the Home Falls can cause injuries. They can happen to people of all ages. There are many things you can do to make your home safe and to help prevent falls. What can I do on the outside of my home?  Regularly fix the edges of walkways and driveways and fix any cracks.  Remove anything that might make you trip as you walk through a door, such as a raised step or threshold.  Trim any bushes or trees on the path to your home.  Use bright outdoor lighting.  Clear any walking paths of anything that might make someone trip, such as rocks or tools.  Regularly check to see if handrails are loose or broken. Make sure that both sides of any steps have handrails.  Any raised decks and porches should have guardrails on the edges.  Have any leaves, snow, or ice cleared regularly.  Use sand or salt on walking paths during winter.  Clean up any spills in your garage right away. This includes oil or grease spills. What can I do in the bathroom?  Use night lights.  Install grab bars by the toilet and in the tub and shower. Do not use towel bars as grab bars.  Use non-skid mats or decals in the tub or shower.  If you need to sit down in  the shower, use a plastic, non-slip stool.  Keep the floor dry. Clean up any water that spills on the floor as soon as it happens.  Remove soap buildup in the tub or shower regularly.  Attach bath mats securely with double-sided non-slip rug tape.  Do not have throw rugs and other things on the floor that can make you trip. What can I do in the bedroom?  Use night lights.  Make sure that you have a light by your bed that is easy to reach.  Do not use any sheets or blankets that are too big for your bed. They should not hang down onto the floor.  Have a firm chair that has side arms. You can use this for support while you get dressed.  Do not have throw rugs and other things on the floor that can make you trip. What can I do in the kitchen?  Clean up any spills right away.  Avoid walking on wet floors.  Keep items that you use a lot in easy-to-reach places.  If you need to reach something above you, use a strong step stool that has a grab bar.  Keep electrical cords out of the way.  Do not use floor  polish or wax that makes floors slippery. If you must use wax, use non-skid floor wax.  Do not have throw rugs and other things on the floor that can make you trip. What can I do with my stairs?  Do not leave any items on the stairs.  Make sure that there are handrails on both sides of the stairs and use them. Fix handrails that are broken or loose. Make sure that handrails are as long as the stairways.  Check any carpeting to make sure that it is firmly attached to the stairs. Fix any carpet that is loose or worn.  Avoid having throw rugs at the top or bottom of the stairs. If you do have throw rugs, attach them to the floor with carpet tape.  Make sure that you have a light switch at the top of the stairs and the bottom of the stairs. If you do not have them, ask someone to add them for you. What else can I do to help prevent falls?  Wear shoes that:  Do not have high  heels.  Have rubber bottoms.  Are comfortable and fit you well.  Are closed at the toe. Do not wear sandals.  If you use a stepladder:  Make sure that it is fully opened. Do not climb a closed stepladder.  Make sure that both sides of the stepladder are locked into place.  Ask someone to hold it for you, if possible.  Clearly mark and make sure that you can see:  Any grab bars or handrails.  First and last steps.  Where the edge of each step is.  Use tools that help you move around (mobility aids) if they are needed. These include:  Canes.  Walkers.  Scooters.  Crutches.  Turn on the lights when you go into a dark area. Replace any light bulbs as soon as they burn out.  Set up your furniture so you have a clear path. Avoid moving your furniture around.  If any of your floors are uneven, fix them.  If there are any pets around you, be aware of where they are.  Review your medicines with your doctor. Some medicines can make you feel dizzy. This can increase your chance of falling. Ask your doctor what other things that you can do to help prevent falls. This information is not intended to replace advice given to you by your health care provider. Make sure you discuss any questions you have with your health care provider. Document Released: 04/12/2009 Document Revised: 11/22/2015 Document Reviewed: 07/21/2014 Elsevier Interactive Patient Education  2017 [employer].

## 2020-11-14 DIAGNOSIS — Z0100 Encounter for examination of eyes and vision without abnormal findings: Secondary | ICD-10-CM | POA: Diagnosis not present

## 2020-11-15 DIAGNOSIS — E119 Type 2 diabetes mellitus without complications: Secondary | ICD-10-CM | POA: Diagnosis not present

## 2020-11-15 DIAGNOSIS — M1990 Unspecified osteoarthritis, unspecified site: Secondary | ICD-10-CM | POA: Diagnosis not present

## 2020-11-15 DIAGNOSIS — Z981 Arthrodesis status: Secondary | ICD-10-CM | POA: Diagnosis not present

## 2020-11-15 DIAGNOSIS — D649 Anemia, unspecified: Secondary | ICD-10-CM | POA: Diagnosis not present

## 2020-11-15 DIAGNOSIS — J449 Chronic obstructive pulmonary disease, unspecified: Secondary | ICD-10-CM | POA: Diagnosis not present

## 2020-11-15 DIAGNOSIS — I10 Essential (primary) hypertension: Secondary | ICD-10-CM | POA: Diagnosis not present

## 2020-11-15 DIAGNOSIS — E785 Hyperlipidemia, unspecified: Secondary | ICD-10-CM | POA: Diagnosis not present

## 2020-11-15 DIAGNOSIS — Z947 Corneal transplant status: Secondary | ICD-10-CM | POA: Diagnosis not present

## 2020-11-15 DIAGNOSIS — S42201D Unspecified fracture of upper end of right humerus, subsequent encounter for fracture with routine healing: Secondary | ICD-10-CM | POA: Diagnosis not present

## 2020-11-15 DIAGNOSIS — K219 Gastro-esophageal reflux disease without esophagitis: Secondary | ICD-10-CM | POA: Diagnosis not present

## 2020-11-19 ENCOUNTER — Other Ambulatory Visit: Payer: Self-pay | Admitting: Family Medicine

## 2020-11-19 DIAGNOSIS — M8000XD Age-related osteoporosis with current pathological fracture, unspecified site, subsequent encounter for fracture with routine healing: Secondary | ICD-10-CM

## 2020-11-20 DIAGNOSIS — S42201D Unspecified fracture of upper end of right humerus, subsequent encounter for fracture with routine healing: Secondary | ICD-10-CM | POA: Diagnosis not present

## 2020-11-20 DIAGNOSIS — K219 Gastro-esophageal reflux disease without esophagitis: Secondary | ICD-10-CM | POA: Diagnosis not present

## 2020-11-20 DIAGNOSIS — I10 Essential (primary) hypertension: Secondary | ICD-10-CM | POA: Diagnosis not present

## 2020-11-20 DIAGNOSIS — E119 Type 2 diabetes mellitus without complications: Secondary | ICD-10-CM | POA: Diagnosis not present

## 2020-11-20 DIAGNOSIS — D649 Anemia, unspecified: Secondary | ICD-10-CM | POA: Diagnosis not present

## 2020-11-20 DIAGNOSIS — M1990 Unspecified osteoarthritis, unspecified site: Secondary | ICD-10-CM | POA: Diagnosis not present

## 2020-11-20 DIAGNOSIS — E785 Hyperlipidemia, unspecified: Secondary | ICD-10-CM | POA: Diagnosis not present

## 2020-11-20 DIAGNOSIS — Z981 Arthrodesis status: Secondary | ICD-10-CM | POA: Diagnosis not present

## 2020-11-20 DIAGNOSIS — Z947 Corneal transplant status: Secondary | ICD-10-CM | POA: Diagnosis not present

## 2020-11-20 DIAGNOSIS — J449 Chronic obstructive pulmonary disease, unspecified: Secondary | ICD-10-CM | POA: Diagnosis not present

## 2020-11-22 DIAGNOSIS — Z947 Corneal transplant status: Secondary | ICD-10-CM | POA: Diagnosis not present

## 2020-11-22 DIAGNOSIS — H18513 Endothelial corneal dystrophy, bilateral: Secondary | ICD-10-CM | POA: Diagnosis not present

## 2020-11-23 DIAGNOSIS — J449 Chronic obstructive pulmonary disease, unspecified: Secondary | ICD-10-CM | POA: Diagnosis not present

## 2020-11-23 DIAGNOSIS — M1990 Unspecified osteoarthritis, unspecified site: Secondary | ICD-10-CM | POA: Diagnosis not present

## 2020-11-23 DIAGNOSIS — S42201D Unspecified fracture of upper end of right humerus, subsequent encounter for fracture with routine healing: Secondary | ICD-10-CM | POA: Diagnosis not present

## 2020-11-23 DIAGNOSIS — I10 Essential (primary) hypertension: Secondary | ICD-10-CM | POA: Diagnosis not present

## 2020-11-23 DIAGNOSIS — E785 Hyperlipidemia, unspecified: Secondary | ICD-10-CM | POA: Diagnosis not present

## 2020-11-23 DIAGNOSIS — Z947 Corneal transplant status: Secondary | ICD-10-CM | POA: Diagnosis not present

## 2020-11-23 DIAGNOSIS — J301 Allergic rhinitis due to pollen: Secondary | ICD-10-CM | POA: Diagnosis not present

## 2020-11-23 DIAGNOSIS — D649 Anemia, unspecified: Secondary | ICD-10-CM | POA: Diagnosis not present

## 2020-11-23 DIAGNOSIS — K219 Gastro-esophageal reflux disease without esophagitis: Secondary | ICD-10-CM | POA: Diagnosis not present

## 2020-11-23 DIAGNOSIS — Z981 Arthrodesis status: Secondary | ICD-10-CM | POA: Diagnosis not present

## 2020-11-23 DIAGNOSIS — J3089 Other allergic rhinitis: Secondary | ICD-10-CM | POA: Diagnosis not present

## 2020-11-23 DIAGNOSIS — E119 Type 2 diabetes mellitus without complications: Secondary | ICD-10-CM | POA: Diagnosis not present

## 2020-11-27 DIAGNOSIS — M4802 Spinal stenosis, cervical region: Secondary | ICD-10-CM | POA: Diagnosis not present

## 2020-11-28 DIAGNOSIS — I10 Essential (primary) hypertension: Secondary | ICD-10-CM | POA: Diagnosis not present

## 2020-11-28 DIAGNOSIS — J449 Chronic obstructive pulmonary disease, unspecified: Secondary | ICD-10-CM | POA: Diagnosis not present

## 2020-11-28 DIAGNOSIS — Z7984 Long term (current) use of oral hypoglycemic drugs: Secondary | ICD-10-CM | POA: Diagnosis not present

## 2020-11-28 DIAGNOSIS — E785 Hyperlipidemia, unspecified: Secondary | ICD-10-CM | POA: Diagnosis not present

## 2020-11-28 DIAGNOSIS — E119 Type 2 diabetes mellitus without complications: Secondary | ICD-10-CM | POA: Diagnosis not present

## 2020-11-28 DIAGNOSIS — K219 Gastro-esophageal reflux disease without esophagitis: Secondary | ICD-10-CM | POA: Diagnosis not present

## 2020-11-28 DIAGNOSIS — S42201D Unspecified fracture of upper end of right humerus, subsequent encounter for fracture with routine healing: Secondary | ICD-10-CM | POA: Diagnosis not present

## 2020-11-28 DIAGNOSIS — Z7951 Long term (current) use of inhaled steroids: Secondary | ICD-10-CM | POA: Diagnosis not present

## 2020-11-28 DIAGNOSIS — M1990 Unspecified osteoarthritis, unspecified site: Secondary | ICD-10-CM | POA: Diagnosis not present

## 2020-11-28 DIAGNOSIS — D649 Anemia, unspecified: Secondary | ICD-10-CM | POA: Diagnosis not present

## 2020-11-30 DIAGNOSIS — E785 Hyperlipidemia, unspecified: Secondary | ICD-10-CM | POA: Diagnosis not present

## 2020-11-30 DIAGNOSIS — M1990 Unspecified osteoarthritis, unspecified site: Secondary | ICD-10-CM | POA: Diagnosis not present

## 2020-11-30 DIAGNOSIS — D649 Anemia, unspecified: Secondary | ICD-10-CM | POA: Diagnosis not present

## 2020-11-30 DIAGNOSIS — E119 Type 2 diabetes mellitus without complications: Secondary | ICD-10-CM | POA: Diagnosis not present

## 2020-11-30 DIAGNOSIS — Z7984 Long term (current) use of oral hypoglycemic drugs: Secondary | ICD-10-CM | POA: Diagnosis not present

## 2020-11-30 DIAGNOSIS — S42201D Unspecified fracture of upper end of right humerus, subsequent encounter for fracture with routine healing: Secondary | ICD-10-CM | POA: Diagnosis not present

## 2020-11-30 DIAGNOSIS — K219 Gastro-esophageal reflux disease without esophagitis: Secondary | ICD-10-CM | POA: Diagnosis not present

## 2020-11-30 DIAGNOSIS — J449 Chronic obstructive pulmonary disease, unspecified: Secondary | ICD-10-CM | POA: Diagnosis not present

## 2020-11-30 DIAGNOSIS — I10 Essential (primary) hypertension: Secondary | ICD-10-CM | POA: Diagnosis not present

## 2020-11-30 DIAGNOSIS — Z7951 Long term (current) use of inhaled steroids: Secondary | ICD-10-CM | POA: Diagnosis not present

## 2020-12-02 ENCOUNTER — Other Ambulatory Visit: Payer: Self-pay | Admitting: Family Medicine

## 2020-12-04 DIAGNOSIS — D649 Anemia, unspecified: Secondary | ICD-10-CM | POA: Diagnosis not present

## 2020-12-04 DIAGNOSIS — Z7984 Long term (current) use of oral hypoglycemic drugs: Secondary | ICD-10-CM | POA: Diagnosis not present

## 2020-12-04 DIAGNOSIS — Z7951 Long term (current) use of inhaled steroids: Secondary | ICD-10-CM | POA: Diagnosis not present

## 2020-12-04 DIAGNOSIS — I10 Essential (primary) hypertension: Secondary | ICD-10-CM | POA: Diagnosis not present

## 2020-12-04 DIAGNOSIS — J449 Chronic obstructive pulmonary disease, unspecified: Secondary | ICD-10-CM | POA: Diagnosis not present

## 2020-12-04 DIAGNOSIS — S42201D Unspecified fracture of upper end of right humerus, subsequent encounter for fracture with routine healing: Secondary | ICD-10-CM | POA: Diagnosis not present

## 2020-12-04 DIAGNOSIS — E785 Hyperlipidemia, unspecified: Secondary | ICD-10-CM | POA: Diagnosis not present

## 2020-12-04 DIAGNOSIS — K219 Gastro-esophageal reflux disease without esophagitis: Secondary | ICD-10-CM | POA: Diagnosis not present

## 2020-12-04 DIAGNOSIS — M1990 Unspecified osteoarthritis, unspecified site: Secondary | ICD-10-CM | POA: Diagnosis not present

## 2020-12-04 DIAGNOSIS — E119 Type 2 diabetes mellitus without complications: Secondary | ICD-10-CM | POA: Diagnosis not present

## 2020-12-06 DIAGNOSIS — J301 Allergic rhinitis due to pollen: Secondary | ICD-10-CM | POA: Diagnosis not present

## 2020-12-06 DIAGNOSIS — J3089 Other allergic rhinitis: Secondary | ICD-10-CM | POA: Diagnosis not present

## 2020-12-07 ENCOUNTER — Inpatient Hospital Stay: Admission: RE | Admit: 2020-12-07 | Payer: Medicare HMO | Source: Ambulatory Visit

## 2020-12-07 DIAGNOSIS — J449 Chronic obstructive pulmonary disease, unspecified: Secondary | ICD-10-CM | POA: Diagnosis not present

## 2020-12-07 DIAGNOSIS — Z7951 Long term (current) use of inhaled steroids: Secondary | ICD-10-CM | POA: Diagnosis not present

## 2020-12-07 DIAGNOSIS — D649 Anemia, unspecified: Secondary | ICD-10-CM | POA: Diagnosis not present

## 2020-12-07 DIAGNOSIS — E785 Hyperlipidemia, unspecified: Secondary | ICD-10-CM | POA: Diagnosis not present

## 2020-12-07 DIAGNOSIS — K219 Gastro-esophageal reflux disease without esophagitis: Secondary | ICD-10-CM | POA: Diagnosis not present

## 2020-12-07 DIAGNOSIS — Z7984 Long term (current) use of oral hypoglycemic drugs: Secondary | ICD-10-CM | POA: Diagnosis not present

## 2020-12-07 DIAGNOSIS — S42201D Unspecified fracture of upper end of right humerus, subsequent encounter for fracture with routine healing: Secondary | ICD-10-CM | POA: Diagnosis not present

## 2020-12-07 DIAGNOSIS — E119 Type 2 diabetes mellitus without complications: Secondary | ICD-10-CM | POA: Diagnosis not present

## 2020-12-07 DIAGNOSIS — M1990 Unspecified osteoarthritis, unspecified site: Secondary | ICD-10-CM | POA: Diagnosis not present

## 2020-12-07 DIAGNOSIS — I10 Essential (primary) hypertension: Secondary | ICD-10-CM | POA: Diagnosis not present

## 2020-12-10 DIAGNOSIS — D649 Anemia, unspecified: Secondary | ICD-10-CM | POA: Diagnosis not present

## 2020-12-10 DIAGNOSIS — Z7951 Long term (current) use of inhaled steroids: Secondary | ICD-10-CM | POA: Diagnosis not present

## 2020-12-10 DIAGNOSIS — J449 Chronic obstructive pulmonary disease, unspecified: Secondary | ICD-10-CM | POA: Diagnosis not present

## 2020-12-10 DIAGNOSIS — M1990 Unspecified osteoarthritis, unspecified site: Secondary | ICD-10-CM | POA: Diagnosis not present

## 2020-12-10 DIAGNOSIS — I10 Essential (primary) hypertension: Secondary | ICD-10-CM | POA: Diagnosis not present

## 2020-12-10 DIAGNOSIS — K219 Gastro-esophageal reflux disease without esophagitis: Secondary | ICD-10-CM | POA: Diagnosis not present

## 2020-12-10 DIAGNOSIS — E119 Type 2 diabetes mellitus without complications: Secondary | ICD-10-CM | POA: Diagnosis not present

## 2020-12-10 DIAGNOSIS — E785 Hyperlipidemia, unspecified: Secondary | ICD-10-CM | POA: Diagnosis not present

## 2020-12-10 DIAGNOSIS — S42201D Unspecified fracture of upper end of right humerus, subsequent encounter for fracture with routine healing: Secondary | ICD-10-CM | POA: Diagnosis not present

## 2020-12-10 DIAGNOSIS — Z7984 Long term (current) use of oral hypoglycemic drugs: Secondary | ICD-10-CM | POA: Diagnosis not present

## 2020-12-12 ENCOUNTER — Encounter: Payer: Self-pay | Admitting: Family Medicine

## 2020-12-12 ENCOUNTER — Other Ambulatory Visit: Payer: Self-pay

## 2020-12-12 ENCOUNTER — Ambulatory Visit (INDEPENDENT_AMBULATORY_CARE_PROVIDER_SITE_OTHER): Payer: Medicare HMO | Admitting: Family Medicine

## 2020-12-12 VITALS — BP 132/60 | HR 83 | Temp 98.3°F | Ht 62.0 in | Wt 223.3 lb

## 2020-12-12 DIAGNOSIS — I10 Essential (primary) hypertension: Secondary | ICD-10-CM

## 2020-12-12 DIAGNOSIS — E785 Hyperlipidemia, unspecified: Secondary | ICD-10-CM | POA: Diagnosis not present

## 2020-12-12 DIAGNOSIS — E1129 Type 2 diabetes mellitus with other diabetic kidney complication: Secondary | ICD-10-CM

## 2020-12-12 DIAGNOSIS — J3089 Other allergic rhinitis: Secondary | ICD-10-CM | POA: Diagnosis not present

## 2020-12-12 DIAGNOSIS — M810 Age-related osteoporosis without current pathological fracture: Secondary | ICD-10-CM | POA: Diagnosis not present

## 2020-12-12 DIAGNOSIS — J301 Allergic rhinitis due to pollen: Secondary | ICD-10-CM | POA: Diagnosis not present

## 2020-12-12 DIAGNOSIS — K219 Gastro-esophageal reflux disease without esophagitis: Secondary | ICD-10-CM | POA: Diagnosis not present

## 2020-12-12 DIAGNOSIS — I890 Lymphedema, not elsewhere classified: Secondary | ICD-10-CM | POA: Diagnosis not present

## 2020-12-12 MED ORDER — ONETOUCH VERIO VI STRP: ORAL_STRIP | 2 refills | Status: DC

## 2020-12-12 NOTE — Progress Notes (Signed)
Doris Jackson DOB: 06-02-1941 Encounter date: 12/12/2020  This is a 80 y.o. female who presents with Chief Complaint  Patient presents with   Hospitalization Follow-up    History of present illness: Status post total arthroplasty right proximal humerus admitted on 3/23, discharged on 4/1. She has been getting home therapy. She is still a little unsteady; using walker just to aid her. Does feel like she is recovering well. This was one week after she was released following neck surgery. Has been out to store once or twice, but tries to limit self to be careful.   HTN: benazepril 20m, hctz 12.524m metoprolol 508mStates that bp is checked by occ therapist regularly 118/72 yesterday. Usually between 132202-542stolic and 70'70'Wastolic. Sometimes feels dizzy after taking pepcid. She stopped taking spironolactone; felt like she was urinating too much. With incontinence issues this was difficult. Cough is better than it used to be. Urinates every 2 hours. Gets up at least 2-3 times/night.   Does wear support stockings at home; but not every day. Swelling has been stable. Gets more swelling in left leg.   HL: pravastatin 57m31ms.   DMII:sugar last night down to 89. States that runs up to 144. Metformin does bring sugar down for most of day when she takes it. Even half tab seems to help. Still taking actos; still taking glipizide. Avoiding sodas.   Still hurting in lower back. Feels she may need to follow up with back specialist.   Anxiety/pain: cymbalta 20mg70mly  Osteoporosis: vitamin D. Last dexa 08/31/18. She has not been on medication before for bone density.   GERD: famotidine 20mg 51medtime Cough: flonase, advair and doing better. Once GERD treated this seemed to help more with cough.   Had eye exam last month; fall didn't affect corneal transplant.   Allergies  Allergen Reactions   Dust Mite Extract Itching and Cough   Latex Itching    Powder gloves   Current Meds   Medication Sig   Ascorbic Acid (VITAMIN C) 1000 MG tablet Take 1,000 mg by mouth daily.   benazepril (LOTENSIN) 40 MG tablet TAKE 1 TABLET BY MOUTH EVERY DAY   Blood Glucose Monitoring Suppl (ONETOUCH VERIO REFLECT) w/Device KIT USE UP TO FOUR TIMES DAILY AS DIRECTED.   Cholecalciferol (VITAMIN D) 2000 UNITS CAPS Take 4,000 Units by mouth daily.    cyanocobalamin 2000 MCG tablet Take 2,000 mcg by mouth daily.   fexofenadine (ALLEGRA) 180 MG tablet Take 180 mg by mouth daily as needed for allergies.   fluticasone (FLONASE) 50 MCG/ACT nasal spray Place 1 spray into both nostrils daily.   glipiZIDE (GLUCOTROL XL) 5 MG 24 hr tablet TAKE 1 TABLET BY MOUTH EVERY DAY   hydrochlorothiazide (HYDRODIURIL) 12.5 MG tablet Take 1 tablet (12.5 mg total) by mouth daily.   iron polysaccharides (FERREX 150) 150 MG capsule Take 1 capsule (150 mg total) by mouth daily.   Lancets 30G MISC USE TWICE DAILY AS DIRECTED FOR GLUCOSE TESTING AND MONITORING   metoprolol succinate (TOPROL-XL) 50 MG 24 hr tablet Take 1 tablet (50 mg total) by mouth daily. Take with or immediately following a meal.   ONE TOUCH LANCETS MISC Test twice daily.   pioglitazone (ACTOS) 45 MG tablet TAKE 1 TABLET BY MOUTH EVERY DAY   pravastatin (PRAVACHOL) 40 MG tablet Take 1 tablet (40 mg total) by mouth daily.   prednisoLONE acetate (PRED FORTE) 1 % ophthalmic suspension Place 1 drop into both eyes See admin instructions.  Place 1 drop into left eye three times daily Place 1 drop into right eye once daily   Probiotic Product (PROBIOTIC-10 PO) Take 1 capsule by mouth daily.   senna-docusate (SENOKOT-S) 8.6-50 MG tablet Take 1 tablet by mouth at bedtime.   [DISCONTINUED] blood glucose meter kit and supplies KIT Dispense based on patient and insurance preference. Use up to four times daily as directed. (FOR ICD-9 250.00, 250.01).   [DISCONTINUED] ONETOUCH VERIO test strip USE  STRIP TO CHECK GLUCOSE TWICE DAILY    Review of Systems   Constitutional:  Negative for chills, fatigue and fever.  Respiratory:  Negative for cough, chest tightness, shortness of breath and wheezing.   Cardiovascular:  Negative for chest pain, palpitations and leg swelling.  Musculoskeletal:  Positive for arthralgias (right shoulder). Neck stiffness: improved. Neurological:  Positive for weakness (getting stronger with pt work at home).   Objective:  BP 132/60 (BP Location: Left Arm, Patient Position: Sitting, Cuff Size: Large)   Pulse 83   Temp 98.3 F (36.8 C) (Oral)   Ht '5\' 2"'  (1.575 m)   Wt 223 lb 4.8 oz (101.3 kg)   SpO2 99%   BMI 40.84 kg/m   Weight: 223 lb 4.8 oz (101.3 kg)   BP Readings from Last 3 Encounters:  12/12/20 132/60  09/27/20 132/64  09/13/20 137/79   Wt Readings from Last 3 Encounters:  12/12/20 223 lb 4.8 oz (101.3 kg)  09/19/20 226 lb 2.7 oz (102.6 kg)  09/13/20 225 lb (102.1 kg)    Physical Exam Constitutional:      General: She is not in acute distress.    Appearance: She is well-developed.  Cardiovascular:     Rate and Rhythm: Normal rate and regular rhythm.     Heart sounds: Normal heart sounds. No murmur heard.   No friction rub.  Pulmonary:     Effort: Pulmonary effort is normal. No respiratory distress.     Breath sounds: Normal breath sounds. No wheezing or rales.  Musculoskeletal:     Right lower leg: No edema.     Left lower leg: No edema.     Comments: Limited abduction right shoulder to 45 degrees. Pain with abduction, flexion of shoulder. Unable to posteriorly move shoulder. Full rom of left shoulder.   Lymphadenopathy:     Comments: Swelling of right arm - upper arm  Neurological:     Mental Status: She is alert and oriented to person, place, and time.  Psychiatric:        Behavior: Behavior normal.     Assessment/Plan  1. Essential hypertension Well controlled. Continue with benazepril 91m, hctz 12.551m toprol 5046mPatient stopped spironolactone and pressures at home have been  on lower end, so we will continue with other meds. Encouraged her to keep checking at home as we may be able to further cut back on meds.   2. Gastroesophageal reflux disease, unspecified whether esophagitis present Continue with pepcid; taking with foods helps her with this medication.   3. 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   4. Dyslipidemia Continue with pravastatin 21m92mily  5. Osteoporosis, unspecified osteoporosis type, unspecified pathological fracture presence Continue with vitamin D, working on weight bearing exercise. We will re-order bone density to get new baseline. She has not been on medication before, but is interested in making sure to maintain bone health.   6. Lymphedema of right arm  Referring for lymphedema treatment with OT. Suspect this will help her with therapy for shoulder as well. Sent message to ortho to see if anything additional would be recommended by them.  - Ambulatory referral to Occupational Therapy   Return in about 3 months (around 03/14/2021) for Chronic condition visit. 50 minutes spent in chart review, time with patient, exam, discussion of follow up plan/OT/home PT (patient preferred), and charting.     [name], MD

## 2020-12-13 DIAGNOSIS — K219 Gastro-esophageal reflux disease without esophagitis: Secondary | ICD-10-CM | POA: Diagnosis not present

## 2020-12-13 DIAGNOSIS — E785 Hyperlipidemia, unspecified: Secondary | ICD-10-CM | POA: Diagnosis not present

## 2020-12-13 DIAGNOSIS — I10 Essential (primary) hypertension: Secondary | ICD-10-CM | POA: Diagnosis not present

## 2020-12-13 DIAGNOSIS — M1990 Unspecified osteoarthritis, unspecified site: Secondary | ICD-10-CM | POA: Diagnosis not present

## 2020-12-13 DIAGNOSIS — E119 Type 2 diabetes mellitus without complications: Secondary | ICD-10-CM | POA: Diagnosis not present

## 2020-12-13 DIAGNOSIS — D649 Anemia, unspecified: Secondary | ICD-10-CM | POA: Diagnosis not present

## 2020-12-13 DIAGNOSIS — S42201D Unspecified fracture of upper end of right humerus, subsequent encounter for fracture with routine healing: Secondary | ICD-10-CM | POA: Diagnosis not present

## 2020-12-13 DIAGNOSIS — Z7984 Long term (current) use of oral hypoglycemic drugs: Secondary | ICD-10-CM | POA: Diagnosis not present

## 2020-12-13 DIAGNOSIS — J449 Chronic obstructive pulmonary disease, unspecified: Secondary | ICD-10-CM | POA: Diagnosis not present

## 2020-12-13 DIAGNOSIS — Z7951 Long term (current) use of inhaled steroids: Secondary | ICD-10-CM | POA: Diagnosis not present

## 2020-12-21 DIAGNOSIS — D649 Anemia, unspecified: Secondary | ICD-10-CM | POA: Diagnosis not present

## 2020-12-21 DIAGNOSIS — J449 Chronic obstructive pulmonary disease, unspecified: Secondary | ICD-10-CM | POA: Diagnosis not present

## 2020-12-21 DIAGNOSIS — M1990 Unspecified osteoarthritis, unspecified site: Secondary | ICD-10-CM | POA: Diagnosis not present

## 2020-12-21 DIAGNOSIS — I10 Essential (primary) hypertension: Secondary | ICD-10-CM | POA: Diagnosis not present

## 2020-12-21 DIAGNOSIS — E119 Type 2 diabetes mellitus without complications: Secondary | ICD-10-CM | POA: Diagnosis not present

## 2020-12-21 DIAGNOSIS — Z7984 Long term (current) use of oral hypoglycemic drugs: Secondary | ICD-10-CM | POA: Diagnosis not present

## 2020-12-21 DIAGNOSIS — S42201D Unspecified fracture of upper end of right humerus, subsequent encounter for fracture with routine healing: Secondary | ICD-10-CM | POA: Diagnosis not present

## 2020-12-21 DIAGNOSIS — E785 Hyperlipidemia, unspecified: Secondary | ICD-10-CM | POA: Diagnosis not present

## 2020-12-21 DIAGNOSIS — Z7951 Long term (current) use of inhaled steroids: Secondary | ICD-10-CM | POA: Diagnosis not present

## 2020-12-21 DIAGNOSIS — K219 Gastro-esophageal reflux disease without esophagitis: Secondary | ICD-10-CM | POA: Diagnosis not present

## 2020-12-25 DIAGNOSIS — J449 Chronic obstructive pulmonary disease, unspecified: Secondary | ICD-10-CM | POA: Diagnosis not present

## 2020-12-25 DIAGNOSIS — K219 Gastro-esophageal reflux disease without esophagitis: Secondary | ICD-10-CM | POA: Diagnosis not present

## 2020-12-25 DIAGNOSIS — D649 Anemia, unspecified: Secondary | ICD-10-CM | POA: Diagnosis not present

## 2020-12-25 DIAGNOSIS — Z7984 Long term (current) use of oral hypoglycemic drugs: Secondary | ICD-10-CM | POA: Diagnosis not present

## 2020-12-25 DIAGNOSIS — E785 Hyperlipidemia, unspecified: Secondary | ICD-10-CM | POA: Diagnosis not present

## 2020-12-25 DIAGNOSIS — I10 Essential (primary) hypertension: Secondary | ICD-10-CM | POA: Diagnosis not present

## 2020-12-25 DIAGNOSIS — Z7951 Long term (current) use of inhaled steroids: Secondary | ICD-10-CM | POA: Diagnosis not present

## 2020-12-25 DIAGNOSIS — M1990 Unspecified osteoarthritis, unspecified site: Secondary | ICD-10-CM | POA: Diagnosis not present

## 2020-12-25 DIAGNOSIS — E119 Type 2 diabetes mellitus without complications: Secondary | ICD-10-CM | POA: Diagnosis not present

## 2020-12-25 DIAGNOSIS — S42201D Unspecified fracture of upper end of right humerus, subsequent encounter for fracture with routine healing: Secondary | ICD-10-CM | POA: Diagnosis not present

## 2020-12-27 DIAGNOSIS — M1990 Unspecified osteoarthritis, unspecified site: Secondary | ICD-10-CM | POA: Diagnosis not present

## 2020-12-27 DIAGNOSIS — M4802 Spinal stenosis, cervical region: Secondary | ICD-10-CM | POA: Diagnosis not present

## 2020-12-27 DIAGNOSIS — I10 Essential (primary) hypertension: Secondary | ICD-10-CM | POA: Diagnosis not present

## 2020-12-27 DIAGNOSIS — Z9181 History of falling: Secondary | ICD-10-CM | POA: Diagnosis not present

## 2020-12-27 DIAGNOSIS — E119 Type 2 diabetes mellitus without complications: Secondary | ICD-10-CM | POA: Diagnosis not present

## 2020-12-27 DIAGNOSIS — Z7951 Long term (current) use of inhaled steroids: Secondary | ICD-10-CM | POA: Diagnosis not present

## 2020-12-27 DIAGNOSIS — Z981 Arthrodesis status: Secondary | ICD-10-CM | POA: Diagnosis not present

## 2020-12-27 DIAGNOSIS — Z7984 Long term (current) use of oral hypoglycemic drugs: Secondary | ICD-10-CM | POA: Diagnosis not present

## 2020-12-27 DIAGNOSIS — Z7901 Long term (current) use of anticoagulants: Secondary | ICD-10-CM | POA: Diagnosis not present

## 2020-12-27 DIAGNOSIS — Z947 Corneal transplant status: Secondary | ICD-10-CM | POA: Diagnosis not present

## 2020-12-27 DIAGNOSIS — D649 Anemia, unspecified: Secondary | ICD-10-CM | POA: Diagnosis not present

## 2020-12-27 DIAGNOSIS — Z96611 Presence of right artificial shoulder joint: Secondary | ICD-10-CM | POA: Diagnosis not present

## 2020-12-27 DIAGNOSIS — J449 Chronic obstructive pulmonary disease, unspecified: Secondary | ICD-10-CM | POA: Diagnosis not present

## 2020-12-27 DIAGNOSIS — Z853 Personal history of malignant neoplasm of breast: Secondary | ICD-10-CM | POA: Diagnosis not present

## 2020-12-27 DIAGNOSIS — E785 Hyperlipidemia, unspecified: Secondary | ICD-10-CM | POA: Diagnosis not present

## 2020-12-27 DIAGNOSIS — K219 Gastro-esophageal reflux disease without esophagitis: Secondary | ICD-10-CM | POA: Diagnosis not present

## 2020-12-27 DIAGNOSIS — S42201D Unspecified fracture of upper end of right humerus, subsequent encounter for fracture with routine healing: Secondary | ICD-10-CM | POA: Diagnosis not present

## 2020-12-28 ENCOUNTER — Telehealth: Payer: Self-pay

## 2020-12-28 NOTE — Telephone Encounter (Signed)
PA approved Key: [id number]

## 2020-12-31 DIAGNOSIS — Z7984 Long term (current) use of oral hypoglycemic drugs: Secondary | ICD-10-CM | POA: Diagnosis not present

## 2020-12-31 DIAGNOSIS — S42201D Unspecified fracture of upper end of right humerus, subsequent encounter for fracture with routine healing: Secondary | ICD-10-CM | POA: Diagnosis not present

## 2020-12-31 DIAGNOSIS — Z981 Arthrodesis status: Secondary | ICD-10-CM | POA: Diagnosis not present

## 2020-12-31 DIAGNOSIS — K219 Gastro-esophageal reflux disease without esophagitis: Secondary | ICD-10-CM | POA: Diagnosis not present

## 2020-12-31 DIAGNOSIS — Z96611 Presence of right artificial shoulder joint: Secondary | ICD-10-CM | POA: Diagnosis not present

## 2020-12-31 DIAGNOSIS — E119 Type 2 diabetes mellitus without complications: Secondary | ICD-10-CM | POA: Diagnosis not present

## 2020-12-31 DIAGNOSIS — Z947 Corneal transplant status: Secondary | ICD-10-CM | POA: Diagnosis not present

## 2020-12-31 DIAGNOSIS — Z853 Personal history of malignant neoplasm of breast: Secondary | ICD-10-CM | POA: Diagnosis not present

## 2020-12-31 DIAGNOSIS — J449 Chronic obstructive pulmonary disease, unspecified: Secondary | ICD-10-CM | POA: Diagnosis not present

## 2020-12-31 DIAGNOSIS — D649 Anemia, unspecified: Secondary | ICD-10-CM | POA: Diagnosis not present

## 2020-12-31 DIAGNOSIS — E785 Hyperlipidemia, unspecified: Secondary | ICD-10-CM | POA: Diagnosis not present

## 2020-12-31 DIAGNOSIS — M1990 Unspecified osteoarthritis, unspecified site: Secondary | ICD-10-CM | POA: Diagnosis not present

## 2020-12-31 DIAGNOSIS — Z7951 Long term (current) use of inhaled steroids: Secondary | ICD-10-CM | POA: Diagnosis not present

## 2020-12-31 DIAGNOSIS — Z7901 Long term (current) use of anticoagulants: Secondary | ICD-10-CM | POA: Diagnosis not present

## 2020-12-31 DIAGNOSIS — I10 Essential (primary) hypertension: Secondary | ICD-10-CM | POA: Diagnosis not present

## 2020-12-31 DIAGNOSIS — Z9181 History of falling: Secondary | ICD-10-CM | POA: Diagnosis not present

## 2021-01-01 DIAGNOSIS — J301 Allergic rhinitis due to pollen: Secondary | ICD-10-CM | POA: Diagnosis not present

## 2021-01-01 DIAGNOSIS — J3081 Allergic rhinitis due to animal (cat) (dog) hair and dander: Secondary | ICD-10-CM | POA: Diagnosis not present

## 2021-01-03 ENCOUNTER — Other Ambulatory Visit: Payer: Self-pay | Admitting: Family Medicine

## 2021-01-03 DIAGNOSIS — E785 Hyperlipidemia, unspecified: Secondary | ICD-10-CM | POA: Diagnosis not present

## 2021-01-03 DIAGNOSIS — S42201D Unspecified fracture of upper end of right humerus, subsequent encounter for fracture with routine healing: Secondary | ICD-10-CM | POA: Diagnosis not present

## 2021-01-03 DIAGNOSIS — Z7984 Long term (current) use of oral hypoglycemic drugs: Secondary | ICD-10-CM | POA: Diagnosis not present

## 2021-01-03 DIAGNOSIS — D649 Anemia, unspecified: Secondary | ICD-10-CM | POA: Diagnosis not present

## 2021-01-03 DIAGNOSIS — M1990 Unspecified osteoarthritis, unspecified site: Secondary | ICD-10-CM | POA: Diagnosis not present

## 2021-01-03 DIAGNOSIS — Z7951 Long term (current) use of inhaled steroids: Secondary | ICD-10-CM | POA: Diagnosis not present

## 2021-01-03 DIAGNOSIS — E119 Type 2 diabetes mellitus without complications: Secondary | ICD-10-CM | POA: Diagnosis not present

## 2021-01-03 DIAGNOSIS — K219 Gastro-esophageal reflux disease without esophagitis: Secondary | ICD-10-CM | POA: Diagnosis not present

## 2021-01-03 DIAGNOSIS — Z9181 History of falling: Secondary | ICD-10-CM | POA: Diagnosis not present

## 2021-01-03 DIAGNOSIS — Z981 Arthrodesis status: Secondary | ICD-10-CM | POA: Diagnosis not present

## 2021-01-03 DIAGNOSIS — Z947 Corneal transplant status: Secondary | ICD-10-CM | POA: Diagnosis not present

## 2021-01-03 DIAGNOSIS — Z7901 Long term (current) use of anticoagulants: Secondary | ICD-10-CM | POA: Diagnosis not present

## 2021-01-03 DIAGNOSIS — J449 Chronic obstructive pulmonary disease, unspecified: Secondary | ICD-10-CM | POA: Diagnosis not present

## 2021-01-03 DIAGNOSIS — Z853 Personal history of malignant neoplasm of breast: Secondary | ICD-10-CM | POA: Diagnosis not present

## 2021-01-03 DIAGNOSIS — Z96611 Presence of right artificial shoulder joint: Secondary | ICD-10-CM | POA: Diagnosis not present

## 2021-01-03 DIAGNOSIS — I10 Essential (primary) hypertension: Secondary | ICD-10-CM | POA: Diagnosis not present

## 2021-01-07 DIAGNOSIS — Z9181 History of falling: Secondary | ICD-10-CM | POA: Diagnosis not present

## 2021-01-07 DIAGNOSIS — Z853 Personal history of malignant neoplasm of breast: Secondary | ICD-10-CM | POA: Diagnosis not present

## 2021-01-07 DIAGNOSIS — I10 Essential (primary) hypertension: Secondary | ICD-10-CM | POA: Diagnosis not present

## 2021-01-07 DIAGNOSIS — S42201D Unspecified fracture of upper end of right humerus, subsequent encounter for fracture with routine healing: Secondary | ICD-10-CM | POA: Diagnosis not present

## 2021-01-07 DIAGNOSIS — K219 Gastro-esophageal reflux disease without esophagitis: Secondary | ICD-10-CM | POA: Diagnosis not present

## 2021-01-07 DIAGNOSIS — Z7951 Long term (current) use of inhaled steroids: Secondary | ICD-10-CM | POA: Diagnosis not present

## 2021-01-07 DIAGNOSIS — E785 Hyperlipidemia, unspecified: Secondary | ICD-10-CM | POA: Diagnosis not present

## 2021-01-07 DIAGNOSIS — D649 Anemia, unspecified: Secondary | ICD-10-CM | POA: Diagnosis not present

## 2021-01-07 DIAGNOSIS — Z947 Corneal transplant status: Secondary | ICD-10-CM | POA: Diagnosis not present

## 2021-01-07 DIAGNOSIS — Z7901 Long term (current) use of anticoagulants: Secondary | ICD-10-CM | POA: Diagnosis not present

## 2021-01-07 DIAGNOSIS — Z7984 Long term (current) use of oral hypoglycemic drugs: Secondary | ICD-10-CM | POA: Diagnosis not present

## 2021-01-07 DIAGNOSIS — J449 Chronic obstructive pulmonary disease, unspecified: Secondary | ICD-10-CM | POA: Diagnosis not present

## 2021-01-07 DIAGNOSIS — M1990 Unspecified osteoarthritis, unspecified site: Secondary | ICD-10-CM | POA: Diagnosis not present

## 2021-01-07 DIAGNOSIS — Z96611 Presence of right artificial shoulder joint: Secondary | ICD-10-CM | POA: Diagnosis not present

## 2021-01-07 DIAGNOSIS — Z981 Arthrodesis status: Secondary | ICD-10-CM | POA: Diagnosis not present

## 2021-01-07 DIAGNOSIS — E119 Type 2 diabetes mellitus without complications: Secondary | ICD-10-CM | POA: Diagnosis not present

## 2021-01-10 DIAGNOSIS — Z981 Arthrodesis status: Secondary | ICD-10-CM | POA: Diagnosis not present

## 2021-01-10 DIAGNOSIS — Z7984 Long term (current) use of oral hypoglycemic drugs: Secondary | ICD-10-CM | POA: Diagnosis not present

## 2021-01-10 DIAGNOSIS — M1990 Unspecified osteoarthritis, unspecified site: Secondary | ICD-10-CM | POA: Diagnosis not present

## 2021-01-10 DIAGNOSIS — Z9181 History of falling: Secondary | ICD-10-CM | POA: Diagnosis not present

## 2021-01-10 DIAGNOSIS — Z7951 Long term (current) use of inhaled steroids: Secondary | ICD-10-CM | POA: Diagnosis not present

## 2021-01-10 DIAGNOSIS — K219 Gastro-esophageal reflux disease without esophagitis: Secondary | ICD-10-CM | POA: Diagnosis not present

## 2021-01-10 DIAGNOSIS — E785 Hyperlipidemia, unspecified: Secondary | ICD-10-CM | POA: Diagnosis not present

## 2021-01-10 DIAGNOSIS — S42201D Unspecified fracture of upper end of right humerus, subsequent encounter for fracture with routine healing: Secondary | ICD-10-CM | POA: Diagnosis not present

## 2021-01-10 DIAGNOSIS — I10 Essential (primary) hypertension: Secondary | ICD-10-CM | POA: Diagnosis not present

## 2021-01-10 DIAGNOSIS — Z947 Corneal transplant status: Secondary | ICD-10-CM | POA: Diagnosis not present

## 2021-01-10 DIAGNOSIS — Z7901 Long term (current) use of anticoagulants: Secondary | ICD-10-CM | POA: Diagnosis not present

## 2021-01-10 DIAGNOSIS — Z96611 Presence of right artificial shoulder joint: Secondary | ICD-10-CM | POA: Diagnosis not present

## 2021-01-10 DIAGNOSIS — D649 Anemia, unspecified: Secondary | ICD-10-CM | POA: Diagnosis not present

## 2021-01-10 DIAGNOSIS — Z853 Personal history of malignant neoplasm of breast: Secondary | ICD-10-CM | POA: Diagnosis not present

## 2021-01-10 DIAGNOSIS — E119 Type 2 diabetes mellitus without complications: Secondary | ICD-10-CM | POA: Diagnosis not present

## 2021-01-10 DIAGNOSIS — J449 Chronic obstructive pulmonary disease, unspecified: Secondary | ICD-10-CM | POA: Diagnosis not present

## 2021-01-14 ENCOUNTER — Other Ambulatory Visit: Payer: Self-pay

## 2021-01-14 ENCOUNTER — Ambulatory Visit: Payer: Medicare HMO | Admitting: Physical Therapy

## 2021-01-14 DIAGNOSIS — J449 Chronic obstructive pulmonary disease, unspecified: Secondary | ICD-10-CM | POA: Diagnosis not present

## 2021-01-14 DIAGNOSIS — Z7901 Long term (current) use of anticoagulants: Secondary | ICD-10-CM | POA: Diagnosis not present

## 2021-01-14 DIAGNOSIS — Z981 Arthrodesis status: Secondary | ICD-10-CM | POA: Diagnosis not present

## 2021-01-14 DIAGNOSIS — Z96611 Presence of right artificial shoulder joint: Secondary | ICD-10-CM | POA: Diagnosis not present

## 2021-01-14 DIAGNOSIS — Z853 Personal history of malignant neoplasm of breast: Secondary | ICD-10-CM | POA: Diagnosis not present

## 2021-01-14 DIAGNOSIS — E785 Hyperlipidemia, unspecified: Secondary | ICD-10-CM | POA: Diagnosis not present

## 2021-01-14 DIAGNOSIS — I10 Essential (primary) hypertension: Secondary | ICD-10-CM | POA: Diagnosis not present

## 2021-01-14 DIAGNOSIS — Z7984 Long term (current) use of oral hypoglycemic drugs: Secondary | ICD-10-CM | POA: Diagnosis not present

## 2021-01-14 DIAGNOSIS — Z7951 Long term (current) use of inhaled steroids: Secondary | ICD-10-CM | POA: Diagnosis not present

## 2021-01-14 DIAGNOSIS — M1990 Unspecified osteoarthritis, unspecified site: Secondary | ICD-10-CM | POA: Diagnosis not present

## 2021-01-14 DIAGNOSIS — D649 Anemia, unspecified: Secondary | ICD-10-CM | POA: Diagnosis not present

## 2021-01-14 DIAGNOSIS — S42201D Unspecified fracture of upper end of right humerus, subsequent encounter for fracture with routine healing: Secondary | ICD-10-CM | POA: Diagnosis not present

## 2021-01-14 DIAGNOSIS — Z947 Corneal transplant status: Secondary | ICD-10-CM | POA: Diagnosis not present

## 2021-01-14 DIAGNOSIS — E119 Type 2 diabetes mellitus without complications: Secondary | ICD-10-CM | POA: Diagnosis not present

## 2021-01-14 DIAGNOSIS — K219 Gastro-esophageal reflux disease without esophagitis: Secondary | ICD-10-CM | POA: Diagnosis not present

## 2021-01-14 DIAGNOSIS — Z9181 History of falling: Secondary | ICD-10-CM | POA: Diagnosis not present

## 2021-01-16 DIAGNOSIS — M1990 Unspecified osteoarthritis, unspecified site: Secondary | ICD-10-CM | POA: Diagnosis not present

## 2021-01-16 DIAGNOSIS — Z7901 Long term (current) use of anticoagulants: Secondary | ICD-10-CM | POA: Diagnosis not present

## 2021-01-16 DIAGNOSIS — E119 Type 2 diabetes mellitus without complications: Secondary | ICD-10-CM | POA: Diagnosis not present

## 2021-01-16 DIAGNOSIS — J449 Chronic obstructive pulmonary disease, unspecified: Secondary | ICD-10-CM | POA: Diagnosis not present

## 2021-01-16 DIAGNOSIS — Z981 Arthrodesis status: Secondary | ICD-10-CM | POA: Diagnosis not present

## 2021-01-16 DIAGNOSIS — Z7984 Long term (current) use of oral hypoglycemic drugs: Secondary | ICD-10-CM | POA: Diagnosis not present

## 2021-01-16 DIAGNOSIS — D649 Anemia, unspecified: Secondary | ICD-10-CM | POA: Diagnosis not present

## 2021-01-16 DIAGNOSIS — K219 Gastro-esophageal reflux disease without esophagitis: Secondary | ICD-10-CM | POA: Diagnosis not present

## 2021-01-16 DIAGNOSIS — Z853 Personal history of malignant neoplasm of breast: Secondary | ICD-10-CM | POA: Diagnosis not present

## 2021-01-16 DIAGNOSIS — S42201D Unspecified fracture of upper end of right humerus, subsequent encounter for fracture with routine healing: Secondary | ICD-10-CM | POA: Diagnosis not present

## 2021-01-16 DIAGNOSIS — Z7951 Long term (current) use of inhaled steroids: Secondary | ICD-10-CM | POA: Diagnosis not present

## 2021-01-16 DIAGNOSIS — Z9181 History of falling: Secondary | ICD-10-CM | POA: Diagnosis not present

## 2021-01-16 DIAGNOSIS — Z947 Corneal transplant status: Secondary | ICD-10-CM | POA: Diagnosis not present

## 2021-01-16 DIAGNOSIS — Z96611 Presence of right artificial shoulder joint: Secondary | ICD-10-CM | POA: Diagnosis not present

## 2021-01-16 DIAGNOSIS — E785 Hyperlipidemia, unspecified: Secondary | ICD-10-CM | POA: Diagnosis not present

## 2021-01-16 DIAGNOSIS — I10 Essential (primary) hypertension: Secondary | ICD-10-CM | POA: Diagnosis not present

## 2021-01-21 DIAGNOSIS — J3089 Other allergic rhinitis: Secondary | ICD-10-CM | POA: Diagnosis not present

## 2021-01-21 DIAGNOSIS — J301 Allergic rhinitis due to pollen: Secondary | ICD-10-CM | POA: Diagnosis not present

## 2021-01-23 ENCOUNTER — Other Ambulatory Visit: Payer: Self-pay | Admitting: *Deleted

## 2021-01-23 MED ORDER — BENAZEPRIL HCL 40 MG PO TABS: 40.0000 mg | ORAL_TABLET | Freq: Every day | ORAL | 0 refills | Status: DC

## 2021-01-23 NOTE — Telephone Encounter (Signed)
[facility] faxed a refill request for Benazepril '40mg'$  and Furosemide '20mg'$ .  Message sent to PCP as Furosemide is not on the patients current med list.

## 2021-01-24 DIAGNOSIS — J449 Chronic obstructive pulmonary disease, unspecified: Secondary | ICD-10-CM | POA: Diagnosis not present

## 2021-01-24 DIAGNOSIS — Z9181 History of falling: Secondary | ICD-10-CM | POA: Diagnosis not present

## 2021-01-24 DIAGNOSIS — Z7984 Long term (current) use of oral hypoglycemic drugs: Secondary | ICD-10-CM | POA: Diagnosis not present

## 2021-01-24 DIAGNOSIS — K219 Gastro-esophageal reflux disease without esophagitis: Secondary | ICD-10-CM | POA: Diagnosis not present

## 2021-01-24 DIAGNOSIS — Z96611 Presence of right artificial shoulder joint: Secondary | ICD-10-CM | POA: Diagnosis not present

## 2021-01-24 DIAGNOSIS — D649 Anemia, unspecified: Secondary | ICD-10-CM | POA: Diagnosis not present

## 2021-01-24 DIAGNOSIS — M1990 Unspecified osteoarthritis, unspecified site: Secondary | ICD-10-CM | POA: Diagnosis not present

## 2021-01-24 DIAGNOSIS — Z947 Corneal transplant status: Secondary | ICD-10-CM | POA: Diagnosis not present

## 2021-01-24 DIAGNOSIS — Z7951 Long term (current) use of inhaled steroids: Secondary | ICD-10-CM | POA: Diagnosis not present

## 2021-01-24 DIAGNOSIS — Z853 Personal history of malignant neoplasm of breast: Secondary | ICD-10-CM | POA: Diagnosis not present

## 2021-01-24 DIAGNOSIS — E119 Type 2 diabetes mellitus without complications: Secondary | ICD-10-CM | POA: Diagnosis not present

## 2021-01-24 DIAGNOSIS — I10 Essential (primary) hypertension: Secondary | ICD-10-CM | POA: Diagnosis not present

## 2021-01-24 DIAGNOSIS — Z7901 Long term (current) use of anticoagulants: Secondary | ICD-10-CM | POA: Diagnosis not present

## 2021-01-24 DIAGNOSIS — S42201D Unspecified fracture of upper end of right humerus, subsequent encounter for fracture with routine healing: Secondary | ICD-10-CM | POA: Diagnosis not present

## 2021-01-24 DIAGNOSIS — Z981 Arthrodesis status: Secondary | ICD-10-CM | POA: Diagnosis not present

## 2021-01-24 DIAGNOSIS — E785 Hyperlipidemia, unspecified: Secondary | ICD-10-CM | POA: Diagnosis not present

## 2021-01-25 ENCOUNTER — Other Ambulatory Visit: Payer: Self-pay | Admitting: Family Medicine

## 2021-01-25 DIAGNOSIS — R600 Localized edema: Secondary | ICD-10-CM

## 2021-01-25 MED ORDER — FUROSEMIDE 20 MG PO TABS: 20.0000 mg | ORAL_TABLET | Freq: Every day | ORAL | 1 refills | Status: DC | PRN

## 2021-01-27 DIAGNOSIS — M4802 Spinal stenosis, cervical region: Secondary | ICD-10-CM | POA: Diagnosis not present

## 2021-01-28 ENCOUNTER — Other Ambulatory Visit: Payer: Self-pay | Admitting: Family Medicine

## 2021-01-30 ENCOUNTER — Telehealth: Payer: Self-pay | Admitting: Family Medicine

## 2021-01-30 NOTE — Telephone Encounter (Signed)
PT went to get her glucose blood (ONETOUCH VERIO) test strip but the Rx told her she needs it increase as it shows 2 times daily on their end where as on our end and the PT end it shows 2-4 times daily. PT would like this corrected.

## 2021-01-31 NOTE — Telephone Encounter (Signed)
Spoke with Ronny Flurry, pharmacist at [facility] and informed her of the message below.  Ronny Flurry stated generally this is not approved unless the patient is on insulin.  Ronny Flurry stated the Rx for test strips to use 2-4 times a day was resubmitted,  went through and will be covered for the pt.  I called the pt and informed her of this and the pharmacy will contact her once the Rx is ready to be picked up.

## 2021-02-01 ENCOUNTER — Telehealth: Payer: Self-pay | Admitting: Pharmacist

## 2021-02-01 NOTE — Chronic Care Management (AMB) (Signed)
    Chronic Care Management Pharmacy Assistant   Name: Doris Jackson  MRN: [id number] DOB: [birth year]  02/01/21-  Called patient to remind of appointment with [name]) on (02/04/21 at 1pm via telephone.)  Patient stated she needed to reschedule. Patient rescheduled to 03/28/21 at 12:30pm via telephone.   Patient aware of appointment date, time, and type of appointment (either telephone or in person). Patient aware to have/bring all medications, supplements, blood pressure and/or blood sugar logs to visit.  Care Gaps:  AWV -  scheduled for 11/19/21 Tetanus/ TDAP - never done Zoster vaccines - never done PAP smear -  overdue since 09/21/13 Foot exam -  overdue since 04/20/19 Covid-19 vaccine booster 4- overdue since 07/19/20 Influenza vaccine -  overdue since 01/28/21  Star Rating Drug:  Benazepril '40mg'$  - last filled on 01/23/21 90DS at [facility] Glipizide '5mg'$  - last filled on 01/22/21 [facility] at [facility] Metformin '500mg'$  - last filled on 11/09/20 90DS at [facility] Pioglitazone '45mg'$  - last filled on 01/08/21 90DS at [facility] Pravastatin '40mg'$  - last filled on 01/05/21 90DS at [facility]  Any gaps in medications fill history? No.  [place]  Clinical Pharmacist Assistant [phone]

## 2021-02-04 ENCOUNTER — Other Ambulatory Visit: Payer: Self-pay

## 2021-02-04 ENCOUNTER — Telehealth: Payer: Self-pay | Admitting: *Deleted

## 2021-02-04 ENCOUNTER — Other Ambulatory Visit: Payer: Self-pay | Admitting: Family Medicine

## 2021-02-04 ENCOUNTER — Ambulatory Visit: Payer: Medicare HMO | Attending: Orthopaedic Surgery

## 2021-02-04 ENCOUNTER — Telehealth: Payer: Medicare HMO

## 2021-02-04 DIAGNOSIS — G8929 Other chronic pain: Secondary | ICD-10-CM | POA: Insufficient documentation

## 2021-02-04 DIAGNOSIS — M545 Low back pain, unspecified: Secondary | ICD-10-CM | POA: Diagnosis present

## 2021-02-04 DIAGNOSIS — I890 Lymphedema, not elsewhere classified: Secondary | ICD-10-CM | POA: Insufficient documentation

## 2021-02-04 DIAGNOSIS — Z483 Aftercare following surgery for neoplasm: Secondary | ICD-10-CM | POA: Insufficient documentation

## 2021-02-04 DIAGNOSIS — R262 Difficulty in walking, not elsewhere classified: Secondary | ICD-10-CM | POA: Diagnosis present

## 2021-02-04 DIAGNOSIS — M6281 Muscle weakness (generalized): Secondary | ICD-10-CM | POA: Insufficient documentation

## 2021-02-04 DIAGNOSIS — M25611 Stiffness of right shoulder, not elsewhere classified: Secondary | ICD-10-CM | POA: Diagnosis present

## 2021-02-04 MED ORDER — FAMOTIDINE 40 MG PO TABS: 40.0000 mg | ORAL_TABLET | Freq: Every day | ORAL | 1 refills | Status: DC

## 2021-02-04 NOTE — Therapy (Signed)
[place], [place], [address] Phone: [phone]   Fax:  [phone]  Physical Therapy Evaluation  Patient Details  Name: Doris Jackson MRN: [id number] Date of Birth: Sep 19, 1940 Referring Provider (PT): Dr. Griffin Basil   Encounter Date: 02/04/2021   PT End of Session - 02/04/21 1154     Visit Number 1    Number of Visits 18    Date for PT Re-Evaluation 03/18/21    PT Start Time 1104    PT Stop Time 1150    PT Time Calculation (min) 46 min    Activity Tolerance Patient tolerated treatment well    Behavior During Therapy [facility] for tasks assessed/performed             Past Medical History:  Diagnosis Date   ALLERGIC RHINITIS 06/17/2010   ANEMIA-NOS 06/17/2010   BREAST CANCER, HX OF 06/17/2010   Breast cancer, right ([place]) 2008   Cataract    COPD 06/17/2010   pt denies   DIABETES MELLITUS, TYPE II 06/17/2010   Endometriosis    Fibroid    FIBROID   GERD (gastroesophageal reflux disease)    HYPERLIPIDEMIA 06/17/2010   HYPERTENSION 06/17/2010   Leg swelling    LOW BACK PAIN 06/17/2010   OSTEOARTHRITIS 06/17/2010   OSTEOPENIA 06/17/2010   Weakness     Past Surgical History:  Procedure Laterality Date   ABDOMINAL HYSTERECTOMY  1988   TAH,LSO   ANTERIOR CERVICAL DECOMP/DISCECTOMY FUSION N/A 09/15/2019   Procedure: Cervical five CorpectomyANTERIOR CERVICAL DECOMPRESSION/DISCECTOMY FUSION CERVICAL THREE-CERVICAL Four;  Surgeon: Ashok Pall, MD;  Location: [place];  Service: Neurosurgery;  Laterality: N/A;   BREAST LUMPECTOMY Right 2007   LUMPECTOMY FOLLOWED BY RADIATION   COLONOSCOPY     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    Subjective Assessment - 02/04/21 1148     Subjective Pt had a right breast lumpectomy F/b radiation in 2007, and for a number of years she has noticed a little bit of swelling.  She had a fall at [facility] in March and fractured her shoulder resulting in a Right Reverse Total Shoulder repolacement on 09/19/2020.  Following that she experienced more swelling and heaviness.  She is referred today for Lymphedema treatment    Patient is accompained by: Family member   husband   Pertinent History Pt is s/p right breast lumpectomy with radiation in 2007.  She has had some swelling for a number of years, but has never been treated.  She suffered a fall at [facility] in March and fractured her right shoulder resulting in a Right reverse total Shoulder.  After that she noticed more swelling in the right UE and was referred by Dr. Griffin Basil for lymphedema treatment. She is also diabetic and has hypertension. She has LBP, prior cervical fusion, OA. She is using a rollator    Patient Stated Goals Decrease  swelling in arm, gain information from provider    Currently in Pain? No/denies                [facility] PT Assessment - 02/04/21 0001       Assessment   Medical Diagnosis Lymphedema    Referring Provider (PT) Dr. Griffin Basil    Onset Date/Surgical Date 09/19/20      Balance Screen   Has the patient fallen in the past 6 months Yes    How many times? 1    Has the patient had a decrease in activity level because of a fear of falling?  Yes    Is the patient reluctant to leave their home because of a fear of falling?  Yes      [place] residence    Living Arrangements Spouse/significant other     Available Help at Discharge Family    Type of [place] Access Level entry      Prior Function   Level of Independence Independent with basic ADLs    Vocation Retired    Leisure none      Observation/Other Assessments   Observations healed surgical scar from reverse TSA      Posture/Postural Control   Posture/Postural Control Postural limitations    Postural Limitations Rounded Shoulders;Forward head      AROM   Right/Left Shoulder Right    Right Shoulder Flexion 75 Degrees    Right Shoulder ABduction 60 Degrees      Ambulation/Gait   Assistive device 4-wheeled walker;Straight cane               LYMPHEDEMA/ONCOLOGY QUESTIONNAIRE - 02/04/21 0001       Type   Cancer Type Right breast Cancer      Surgeries   Lumpectomy Date --   2007   Number Lymph Nodes Removed --   not sure     Treatment   Active Chemotherapy Treatment No    Past Chemotherapy Treatment No    Active Radiation Treatment No    Past Radiation Treatment Yes    Date --   33 treatments   Current Hormone Treatment No    Past Hormone Therapy Yes    Drug Name pt unsure      What other symptoms do you have   Are you Having Heaviness or Tightness Yes   after she exercises   Are you having Pain No    Is it Hard or Difficult finding clothes that fit No    Do you have infections No      Right Upper Extremity Lymphedema   At Axilla  38.8 cm    15 cm Proximal to Olecranon Process 46.4 cm    Olecranon Process 30.5 cm    15 cm Proximal to Ulnar Styloid Process 29.3 cm    10 cm Proximal to Ulnar Styloid Process 26.7 cm    Just Proximal to Ulnar Styloid Process 21.9 cm    Across Hand at [employer] 19.5 cm    At [place] of 2nd Digit 6.7 cm      Left Upper Extremity Lymphedema   At Axilla  38.4 cm    15 cm Proximal to Olecranon Process 40.8 cm    Olecranon Process 28 cm    15 cm Proximal to Ulnar Styloid Process 26 cm    10 cm Proximal to Ulnar Styloid Process 24 cm    Just Proximal to  Ulnar Styloid Process 18.6 cm    Across Hand at [employer] 19.9 cm    At [place] of 2nd Digit 6.4 cm                     Objective measurements completed on examination: See above findings.                    PT Long Term Goals - 02/04/21 1208       PT LONG TERM GOAL #1   Title Pt will have decreased edema  at 15 cm prox to ulna and olecranon by 2-4 cm    Time 6    Period Weeks    Status New    Target Date 03/18/21      PT LONG TERM GOAL #2   Title Pts husband will be independent with compression bandaging to help reduce right UE swelling.    Time 6    Period Weeks    Status New    Target Date 03/18/21      PT LONG TERM GOAL #3   Title Pt will be fit for appropiate compression garments and will be independent with husbands help in their use    Time 6    Status New    Target Date 03/18/21      PT LONG TERM GOAL #4   Title Pt will be educated in skin care, and precautions for lymphedema    Time 6    Period Weeks    Status New    Target Date 03/18/21                    Plan - 02/04/21 1203     Clinical Impression Statement Pt had a right lumpectomy in 2007 and for a number of years had mild swelling.  A fall followed by a reverse total shoulder surgery on March 23,2022 caused an increase in right arm swelling.  She was referred for lymphedema treatment to decrease right arm swelling.  Pt and her husband were educated on lymphedema and treatment that will be provided. They will return on Wednesday for the initiation of MLD and compression bandaging. Pt will also benefit from AA/A/PROM of the right shoulder and gentle strength   Personal Factors and Comorbidities Age;Comorbidity 3+    Comorbidities Right lumpectomy with SLNB and radiation, Recent Reverse right TSA, Cervical Fusion, LBP, OA,hypertension    Examination-Activity Limitations Lift;Reach Overhead;Locomotion Level    Examination-Participation Restrictions Cleaning     Stability/Clinical Decision Making Stable/Uncomplicated    Clinical Decision Making Low    Rehab Potential Good    PT Frequency 3x / week    PT Duration 6 weeks    PT Treatment/Interventions ADLs/Self Care Home Management;Therapeutic exercise;Manual techniques;Orthotic Fit/Training;Patient/family education;Manual lymph drainage;Compression bandaging;Scar mobilization;Passive range of motion    PT Next Visit Plan initiate CDP, MLD, compression bandaging, May include AAROM, PROM,strength for right UE.  (per MD;no limitations)    Consulted and Agree with Plan of Care Patient;Family member/caregiver   husband            Patient will benefit from skilled therapeutic intervention in order to improve the following deficits and impairments:  Decreased range of motion, Decreased scar mobility, Decreased strength, Increased edema, Postural dysfunction, Impaired UE functional use  Visit Diagnosis: Aftercare following surgery for neoplasm  Lymphedema, not elsewhere classified  Stiffness of right shoulder, not elsewhere classified  Muscle weakness (generalized)     Problem  List Patient Active Problem List   Diagnosis Date Noted   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   Allergic rhinitis due to animal (cat) (dog) hair and dander 07/18/2020   Leiomyoma 07/18/2020   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   Anxiety state    Acute blood loss anemia    Postoperative pain    Cervical myelopathy ([place]) 09/22/2019   S/P cervical spinal fusion 09/22/2019   Cervical spondylosis with myelopathy and radiculopathy 09/15/2019   AKI (acute kidney injury) ([place]) 09/14/2019   GERD (gastroesophageal reflux disease) 09/14/2019   Spinal stenosis in cervical region 09/14/2019   Weakness 09/14/2019   Numbness and tingling 09/14/2019   Cervical spinal stenosis 09/14/2019   Status post cataract extraction and insertion of intraocular lens of left eye  09/04/2019   Lumbar stenosis 08/24/2019   Allergic rhinitis due to pollen 06/06/2019   Persistent cough 06/06/2019   History of Descemet membrane endothelial keratoplasty (DMEK) 01/04/2019   Status post cataract extraction and insertion of intraocular lens of right eye 01/04/2019   Senile nuclear sclerosis 01/02/2019   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   Anatomical narrow angle, 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   Obesity, morbid, BMI 40.0-49.9 ([place]) 04/19/2018   Morbid obesity ([place]) 04/19/2018   Cataract    Fibroid    Endometriosis    DM (diabetes mellitus) type II controlled with renal manifestation ([place]) 06/17/2010   Dyslipidemia 06/17/2010   Microcytic anemia 06/17/2010   Essential hypertension 06/17/2010   Allergic rhinitis 06/17/2010   Osteoarthritis 06/17/2010   LOW BACK PAIN 06/17/2010   OSTEOPENIA 06/17/2010   BREAST CANCER, HX OF 06/17/2010   Disorder of skeletal system 06/17/2010    Doris Jackson 02/04/2021, 12:12 PM  [place], [place], [address] Phone: [phone]   Fax:  [phone]  Name: Doris Jackson MRN: [id number] Date of Birth: [birth year] [name], PT 02/04/21 12:15 PM

## 2021-02-04 NOTE — Telephone Encounter (Signed)
[facility] faxed a refill request for Famotodine '40mg'$ -take 1 tablet by mouth once daily at night-#30.  Message sent to PCP as Rx listed as historical med.

## 2021-02-04 NOTE — Telephone Encounter (Signed)
Refill sent.

## 2021-02-06 ENCOUNTER — Ambulatory Visit: Payer: Medicare HMO

## 2021-02-06 ENCOUNTER — Other Ambulatory Visit: Payer: Self-pay

## 2021-02-06 DIAGNOSIS — M6281 Muscle weakness (generalized): Secondary | ICD-10-CM | POA: Diagnosis not present

## 2021-02-06 DIAGNOSIS — M25611 Stiffness of right shoulder, not elsewhere classified: Secondary | ICD-10-CM

## 2021-02-06 DIAGNOSIS — G8929 Other chronic pain: Secondary | ICD-10-CM | POA: Diagnosis not present

## 2021-02-06 DIAGNOSIS — I890 Lymphedema, not elsewhere classified: Secondary | ICD-10-CM | POA: Diagnosis not present

## 2021-02-06 DIAGNOSIS — Z483 Aftercare following surgery for neoplasm: Secondary | ICD-10-CM | POA: Diagnosis not present

## 2021-02-06 DIAGNOSIS — R262 Difficulty in walking, not elsewhere classified: Secondary | ICD-10-CM

## 2021-02-06 DIAGNOSIS — M545 Low back pain, unspecified: Secondary | ICD-10-CM

## 2021-02-06 NOTE — Patient Instructions (Signed)

## 2021-02-06 NOTE — Therapy (Signed)
[place], [place], [address] Phone: [phone]   Fax:  [phone]  Physical Therapy Treatment  Patient Details  Name: Doris Jackson MRN: [id number] Date of Birth: 08-23-1940 Referring Provider (PT): Dr. Griffin Basil   Encounter Date: 02/06/2021   PT End of Session - 02/06/21 1514     Visit Number 2    Number of Visits 18    Date for PT Re-Evaluation 03/18/21    PT Start Time [id number]    PT Stop Time [id number]    PT Time Calculation (min) 55 min    Activity Tolerance Patient tolerated treatment well    Behavior During Therapy [facility] for tasks assessed/performed             Past Medical History:  Diagnosis Date   ALLERGIC RHINITIS 06/17/2010   ANEMIA-NOS 06/17/2010   BREAST CANCER, HX OF 06/17/2010   Breast cancer, right ([place]) 2008   Cataract    COPD 06/17/2010   pt denies   DIABETES MELLITUS, TYPE II 06/17/2010   Endometriosis    Fibroid    FIBROID   GERD (gastroesophageal reflux disease)    HYPERLIPIDEMIA 06/17/2010   HYPERTENSION 06/17/2010   Leg swelling    LOW BACK PAIN 06/17/2010   OSTEOARTHRITIS 06/17/2010   OSTEOPENIA 06/17/2010   Weakness     Past Surgical History:  Procedure Laterality Date   ABDOMINAL HYSTERECTOMY  1988   TAH,LSO   ANTERIOR CERVICAL DECOMP/DISCECTOMY FUSION N/A 09/15/2019   Procedure: Cervical five CorpectomyANTERIOR CERVICAL DECOMPRESSION/DISCECTOMY FUSION CERVICAL THREE-CERVICAL Four;  Surgeon: Ashok Pall, MD;  Location: [place];  Service: Neurosurgery;  Laterality: N/A;   BREAST LUMPECTOMY Right 2007   LUMPECTOMY FOLLOWED BY RADIATION   COLONOSCOPY     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   Subjective Assessment - 02/06/21 1306     Subjective Pt here to initiate treatment.  Arm does fine unless the therapists mess with it when they were rehabbing the shoulder. Pt reports she prefers to sit up for treatment and will be comfortable reclined on the table with head of table elevated.   Pertinent History Pt is s/p right breast lumpectomy with radiation in 2007.  She has had some swelling for a number of years, but has never been treated.  [facility] and fractured her righ shoulder resulting in a Right reverse total Shoulder.  After that she noticed more swelling in the right UE and was referred by Dr. Griffin Basil for lymphedema treatment. She is also diabetic and has hypertension. She has LBP, prior cervical fusion, OA. She is using a rollator    Patient Stated Goals Decrease swelling in arm, gain information from provider    Currently in Pain? No/denies  [place] Adult PT Treatment/Exercise - 02/06/21 0001       Manual Therapy   Manual Lymphatic Drainage (MLD) short neck, left axillary LN's, right inguinal LN's, anterior interaxillary pathway, right axillo inguinal pathway and right UE starting proximally and working distally, retracing all steps. Pt instructed in mechanism of MLD while performing.    Compression Bandaging Large TG soft at upper arm, and medium at lower arm.  Artiflex applied wrist to axilla.  fingers 1-4 wrapped with elastomull, 6 cm to hand and wrist, 10 cm wrist to mid upper arm, and 12 cm mid forearm to axilla. TG soft pulled down over top of bandage                         PT Long Term Goals - 02/04/21  1208       PT LONG TERM GOAL #1   Title Pt will have decreased edema  at 15 cm prox to ulna and olecranon by 2-4 cm    Time 6    Period Weeks    Status New    Target Date 03/18/21      PT LONG TERM GOAL #2   Title Pts husband will be independent with compression bandaging to help reduce right UE swelling.    Time 6    Period Weeks    Status New    Target Date 03/18/21      PT LONG TERM GOAL #3   Title Pt will be fit for appropiate compression garments and will be independent with husbands help in their use    Time 6    Status New    Target Date 03/18/21      PT LONG TERM GOAL #4   Title Pt will be educated in skin care, and precautions for lymphedema    Time 6    Period Weeks    Status New    Target Date 03/18/21                   Plan - 02/06/21 1514     Clinical Impression Statement Initiated MLD to the right UE with head of table elevated for pt comfort and with special emphasis on upper arm.  Pt was instructed in workings of lymphatic system and reasoning behind light touch.  Compression bandaging was applied to right UE with TG soft, elastomull to fingers 1-4,6,10, and 12 cm wrap.  Wrap was comfortable for pt.  She was instructed in wear times and the importance of removing if wrap is ever painful, causes numbness or tingling or if it slides down ino the elbow area.  Pt verbalized understanding.    Personal Factors and Comorbidities Age;Comorbidity 3+    Comorbidities Right lumpectomy with SLNB and radiation, Recent Reverse right TSA, Cervical Fusion, LBP, OA,hypertension    Examination-Activity Limitations Lift;Reach Overhead;Locomotion Level    Examination-Participation Restrictions Cleaning    Stability/Clinical Decision Making Stable/Uncomplicated    Rehab Potential Good    PT Frequency 3x / week    PT Duration 6 weeks    PT Treatment/Interventions ADLs/Self Care Home Management;Therapeutic exercise;Manual techniques;Orthotic Fit/Training;Patient/family  education;Manual lymph drainage;Compression bandaging;Scar mobilization;Passive range of motion    PT Next Visit Plan initiate CDP, MLD, compression bandaging, May include AAROM, PROM,strength for right UE.  (per MD;no limitations), may need to add comprilan soft  next   Consulted and Agree with Plan of Care Patient;Family member/caregiver  Patient will benefit from skilled therapeutic intervention in order to improve the following deficits and impairments:  Decreased range of motion, Decreased scar mobility, Decreased strength, Increased edema, Postural dysfunction, Impaired UE functional use  Visit Diagnosis: Aftercare following surgery for neoplasm  Lymphedema, not elsewhere classified  Stiffness of right shoulder, not elsewhere classified  Muscle weakness (generalized)  Chronic low back pain, unspecified back pain laterality, unspecified whether sciatica present  Difficulty in walking, not elsewhere classified     Problem List Patient Active Problem List   Diagnosis Date Noted   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   Allergic rhinitis due to animal (cat) (dog) hair and dander 07/18/2020   Leiomyoma 07/18/2020   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   Anxiety state    Acute blood loss anemia    Postoperative pain    Cervical myelopathy ([place]) 09/22/2019   S/P cervical spinal fusion 09/22/2019   Cervical spondylosis with myelopathy and radiculopathy 09/15/2019   AKI (acute kidney injury) ([place]) 09/14/2019   GERD (gastroesophageal reflux disease) 09/14/2019   Spinal stenosis in cervical region 09/14/2019   Weakness 09/14/2019   Numbness and tingling 09/14/2019   Cervical spinal stenosis 09/14/2019   Status post cataract extraction and insertion of intraocular lens of left eye 09/04/2019   Lumbar stenosis 08/24/2019   Allergic rhinitis due to pollen 06/06/2019   Persistent cough 06/06/2019   History of  Descemet membrane endothelial keratoplasty (DMEK) 01/04/2019   Status post cataract extraction and insertion of intraocular lens of right eye 01/04/2019   Senile nuclear sclerosis 01/02/2019   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   Anatomical narrow angle, 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   Obesity, morbid, BMI 40.0-49.9 ([place]) 04/19/2018   Morbid obesity ([place]) 04/19/2018   Cataract    Fibroid    Endometriosis    DM (diabetes mellitus) type II controlled with renal manifestation ([place]) 06/17/2010   Dyslipidemia 06/17/2010   Microcytic anemia 06/17/2010   Essential hypertension 06/17/2010   Allergic rhinitis 06/17/2010   Osteoarthritis 06/17/2010   LOW BACK PAIN 06/17/2010   OSTEOPENIA 06/17/2010   BREAST CANCER, HX OF 06/17/2010   Disorder of skeletal system 06/17/2010    Claris Pong 02/06/2021, 3:20 PM  [place], [place], [address] Phone: [phone]   Fax:  [phone]  Name: Doris Jackson MRN: [id number] Date of Birth: 1940-09-03  [name], PT 02/06/21 3:33 PM

## 2021-02-08 ENCOUNTER — Other Ambulatory Visit: Payer: Self-pay

## 2021-02-08 ENCOUNTER — Ambulatory Visit: Payer: Medicare HMO

## 2021-02-08 DIAGNOSIS — M6281 Muscle weakness (generalized): Secondary | ICD-10-CM | POA: Diagnosis not present

## 2021-02-08 DIAGNOSIS — I890 Lymphedema, not elsewhere classified: Secondary | ICD-10-CM

## 2021-02-08 DIAGNOSIS — R262 Difficulty in walking, not elsewhere classified: Secondary | ICD-10-CM | POA: Diagnosis not present

## 2021-02-08 DIAGNOSIS — Z483 Aftercare following surgery for neoplasm: Secondary | ICD-10-CM

## 2021-02-08 DIAGNOSIS — G8929 Other chronic pain: Secondary | ICD-10-CM

## 2021-02-08 DIAGNOSIS — M25611 Stiffness of right shoulder, not elsewhere classified: Secondary | ICD-10-CM | POA: Diagnosis not present

## 2021-02-08 DIAGNOSIS — M545 Low back pain, unspecified: Secondary | ICD-10-CM

## 2021-02-08 NOTE — Therapy (Signed)
[place], [place], [address] Phone: [phone]   Fax:  [phone]  Physical Therapy Treatment  Patient Details  Name: Doris Jackson MRN: [id number] Date of Birth: [birth year] Referring Provider (PT): Dr. Griffin Basil   Encounter Date: 02/08/2021   PT End of Session - 02/08/21 1136     Visit Number 3    Number of Visits 18    Date for PT Re-Evaluation 03/18/21    PT Start Time [id number]    PT Stop Time [id number]    PT Time Calculation (min) 10 min    Activity Tolerance Patient tolerated treatment well    Behavior During Therapy [facility] for tasks assessed/performed             Past Medical History:  Diagnosis Date   ALLERGIC RHINITIS 06/17/2010   ANEMIA-NOS 06/17/2010   BREAST CANCER, HX OF 06/17/2010   Breast cancer, right ([place]) 2008   Cataract    COPD 06/17/2010   pt denies   DIABETES MELLITUS, TYPE II 06/17/2010   Endometriosis    Fibroid    FIBROID   GERD (gastroesophageal reflux disease)    HYPERLIPIDEMIA 06/17/2010   HYPERTENSION 06/17/2010   Leg swelling    LOW BACK PAIN 06/17/2010   OSTEOARTHRITIS 06/17/2010   OSTEOPENIA 06/17/2010   Weakness     Past Surgical History:  Procedure Laterality Date   ABDOMINAL HYSTERECTOMY  1988   TAH,LSO   ANTERIOR CERVICAL DECOMP/DISCECTOMY FUSION N/A 09/15/2019   Procedure: Cervical five CorpectomyANTERIOR CERVICAL DECOMPRESSION/DISCECTOMY FUSION CERVICAL THREE-CERVICAL Four;  Surgeon: Ashok Pall, MD;  Location: [facility];  Service: Neurosurgery;  Laterality: N/A;   BREAST LUMPECTOMY Right 2007   LUMPECTOMY FOLLOWED BY RADIATION   COLONOSCOPY     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   Subjective Assessment - 02/08/21 1032     Subjective I did pretty well with the wrap. The wrap felt good on my arm.  Do we have to take it off today?    Pertinent History Pt is s/p right breast lumpectomy with radiation in 2007.  She has had some swelling for a number of years, but has never been treated.  She suffered a fall at [facility] in March and fractured her righ shoulder resulting in a Right reverse total Shoulder.  After that she noticed more swelling in the right UE and was referred by Dr. Griffin Basil for lymphedema treatment. She is also diabetic and has hypertension. She has LBP, prior cervical fusion, OA. She is using a rollator    Patient Stated Goals Decrease swelling in arm, gain information from provider    Currently in Pain? No/denies                   LYMPHEDEMA/ONCOLOGY QUESTIONNAIRE - 02/08/21 0001       Right Upper Extremity Lymphedema   15 cm Proximal to  Olecranon Process 45.8 cm    15 cm Proximal to Ulnar Styloid Process 28.6 cm                        [facility] Adult PT Treatment/Exercise - 02/08/21 0001       Manual Therapy   Manual Lymphatic Drainage (MLD) short neck, left axillary LN's, right inguinal LN's, anterior interaxillary pathway, right axillo inguinal pathway and right UE starting proximally and working distally, retracing all steps. Pt instructed in mechanism of MLD while performing.    Compression Bandaging Large TG soft at upper arm, and medium at lower arm.  Artiflex applied wrist to axilla.  fingers 1-4 wrapped with elastomull, 6 cm to hand and wrist, 10 cm wrist to mid upper arm, and 12 cm mid forearm to axilla. 12 cm wrist to axilla. TG soft pulled down over top  of bandage                         PT Long Term Goals - 02/04/21 1208       PT LONG TERM GOAL #1   Title Pt will have decreased edema  at 15 cm prox to ulna and olecranon by 2-4 cm    Time 6    Period Weeks    Status New    Target Date 03/18/21      PT LONG TERM GOAL #2   Title Pts husband will be independent with compression bandaging to help reduce right UE swelling.    Time 6    Period Weeks    Status New    Target Date 03/18/21      PT LONG TERM GOAL #3   Title Pt will be fit for appropiate compression garments and will be independent with husbands help in their use    Time 6    Status New    Target Date 03/18/21      PT LONG TERM GOAL #4   Title Pt will be educated in skin care, and precautions for lymphedema    Time 6    Period Weeks    Status New    Target Date 03/18/21                   Plan - 02/08/21 1137     Clinical Impression Statement Pt did well with first wrap and it was comfortable for her.  She got a little reduction in the forearm and upper arm. Wraps were removed and pt washed her arm. Lotion was applied.  We continued MLD and pt was rewrapped.  An additional wrap was added today from the wrist to the axilla. Pt was educated to remove or have her husband remove the top wrap if it ever feels too tight or is uncomfortable. She was reminded to exercise her wrist and elbow to loosen wraps as needed.    Personal Factors and Comorbidities Age;Comorbidity 3+    Comorbidities Right lumpectomy with SLNB and radiation, Recent Reverse right TSA, Cervical Fusion, LBP, OA,hypertension    Examination-Activity Limitations Lift;Reach Overhead;Locomotion Level    Examination-Participation Restrictions Cleaning    Stability/Clinical Decision Making Stable/Uncomplicated    Rehab Potential Good    PT Frequency 3x / week    PT Duration 6 weeks    PT Treatment/Interventions ADLs/Self Care Home Management;Therapeutic exercise;Manual  techniques;Orthotic Fit/Training;Patient/family education;Manual lymph drainage;Compression bandaging;Scar mobilization;Passive range of motion    PT Next Visit Plan initiate CDP, see  how extra wrap did, MLD, compression bandaging, May include AAROM, PROM,strength for right UE.  (per MD;no limitations), may need to add comprilan soft    Consulted and Agree with Plan of Care Patient             Patient will benefit from skilled therapeutic intervention in order to improve the following deficits and impairments:  Decreased range of motion, Decreased scar mobility, Decreased strength, Increased edema, Postural dysfunction, Impaired UE functional use  Visit Diagnosis: Aftercare following surgery for neoplasm  Lymphedema, not elsewhere classified  Stiffness of right shoulder, not elsewhere classified  Muscle weakness (generalized)  Chronic low back pain, unspecified back pain laterality, unspecified whether sciatica present  Difficulty in walking, not elsewhere classified     Problem List Patient Active Problem List   Diagnosis Date Noted   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   Allergic rhinitis due to animal (cat) (dog) hair and dander 07/18/2020   Leiomyoma 07/18/2020   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   Anxiety state    Acute blood loss anemia    Postoperative pain    Cervical myelopathy ([place]) 09/22/2019   S/P cervical spinal fusion 09/22/2019   Cervical spondylosis with myelopathy and radiculopathy 09/15/2019   AKI (acute kidney injury) ([place]) 09/14/2019   GERD (gastroesophageal reflux disease) 09/14/2019   Spinal stenosis in cervical region 09/14/2019   Weakness 09/14/2019   Numbness and tingling 09/14/2019   Cervical spinal stenosis 09/14/2019   Status post cataract extraction and insertion of intraocular lens of left eye 09/04/2019   Lumbar stenosis 08/24/2019   Allergic rhinitis due to pollen 06/06/2019    Persistent cough 06/06/2019   History of Descemet membrane endothelial keratoplasty (DMEK) 01/04/2019   Status post cataract extraction and insertion of intraocular lens of right eye 01/04/2019   Senile nuclear sclerosis 01/02/2019   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   Anatomical narrow angle, 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   Obesity, morbid, BMI 40.0-49.9 ([place]) 04/19/2018   Morbid obesity ([place]) 04/19/2018   Cataract    Fibroid    Endometriosis    DM (diabetes mellitus) type II controlled with renal manifestation ([place]) 06/17/2010   Dyslipidemia 06/17/2010   Microcytic anemia 06/17/2010   Essential hypertension 06/17/2010   Allergic rhinitis 06/17/2010   Osteoarthritis 06/17/2010   LOW BACK PAIN 06/17/2010   OSTEOPENIA 06/17/2010   BREAST CANCER, HX OF 06/17/2010   Disorder of skeletal system 06/17/2010    Doris Jackson 02/08/2021, 11:46 AM  [place], [place], [address] Phone: [phone]   Fax:  [phone]  Name: Doris Jackson MRN: [id number] Date of Birth: Sep 09, 1940 Doris Jackson, PT 02/08/21 11:47 AM

## 2021-02-11 ENCOUNTER — Other Ambulatory Visit: Payer: Self-pay

## 2021-02-11 ENCOUNTER — Ambulatory Visit: Payer: Medicare HMO | Admitting: Physical Therapy

## 2021-02-11 ENCOUNTER — Encounter: Payer: Self-pay | Admitting: Physical Therapy

## 2021-02-11 DIAGNOSIS — J301 Allergic rhinitis due to pollen: Secondary | ICD-10-CM | POA: Diagnosis not present

## 2021-02-11 DIAGNOSIS — M6281 Muscle weakness (generalized): Secondary | ICD-10-CM | POA: Diagnosis not present

## 2021-02-11 DIAGNOSIS — R262 Difficulty in walking, not elsewhere classified: Secondary | ICD-10-CM | POA: Diagnosis not present

## 2021-02-11 DIAGNOSIS — G8929 Other chronic pain: Secondary | ICD-10-CM | POA: Diagnosis not present

## 2021-02-11 DIAGNOSIS — Z483 Aftercare following surgery for neoplasm: Secondary | ICD-10-CM

## 2021-02-11 DIAGNOSIS — M25611 Stiffness of right shoulder, not elsewhere classified: Secondary | ICD-10-CM | POA: Diagnosis not present

## 2021-02-11 DIAGNOSIS — M545 Low back pain, unspecified: Secondary | ICD-10-CM | POA: Diagnosis not present

## 2021-02-11 DIAGNOSIS — I890 Lymphedema, not elsewhere classified: Secondary | ICD-10-CM

## 2021-02-11 DIAGNOSIS — J3089 Other allergic rhinitis: Secondary | ICD-10-CM | POA: Diagnosis not present

## 2021-02-11 NOTE — Therapy (Signed)
[place], [place], [address] Phone: [phone]   Fax:  [phone]  Physical Therapy Treatment  Patient Details  Name: Doris Jackson MRN: [id number] Date of Birth: 01/30/1941 Referring Provider (PT): Dr. Griffin Basil   Encounter Date: 02/11/2021   PT End of Session - 02/11/21 1206     Visit Number 4    Number of Visits 18    Date for PT Re-Evaluation 03/18/21    PT Start Time 1103    PT Stop Time [id number]    PT Time Calculation (min) 52 min    Activity Tolerance Patient tolerated treatment well    Behavior During Therapy [facility] for tasks assessed/performed             Past Medical History:  Diagnosis Date   ALLERGIC RHINITIS 06/17/2010   ANEMIA-NOS 06/17/2010   BREAST CANCER, HX OF 06/17/2010   Breast cancer, right ([place]) 2008   Cataract    COPD 06/17/2010   pt denies   DIABETES MELLITUS, TYPE II 06/17/2010   Endometriosis    Fibroid    FIBROID   GERD (gastroesophageal reflux disease)    HYPERLIPIDEMIA 06/17/2010   HYPERTENSION 06/17/2010   Leg swelling    LOW BACK PAIN 06/17/2010   OSTEOARTHRITIS 06/17/2010   OSTEOPENIA 06/17/2010   Weakness     Past Surgical History:  Procedure Laterality Date   ABDOMINAL HYSTERECTOMY  1988   TAH,LSO   ANTERIOR CERVICAL DECOMP/DISCECTOMY FUSION N/A 09/15/2019   Procedure: Cervical five CorpectomyANTERIOR CERVICAL DECOMPRESSION/DISCECTOMY FUSION CERVICAL THREE-CERVICAL Four;  Surgeon: Ashok Pall, MD;  Location: [facility];  Service: Neurosurgery;  Laterality: N/A;   BREAST LUMPECTOMY Right 2007   LUMPECTOMY FOLLOWED BY RADIATION   COLONOSCOPY     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   Subjective Assessment - 02/11/21 1104     Subjective The wraps felt tight but I hung in there and I didn't take them off.    Pertinent History Pt is s/p right breast lumpectomy with radiation in 2007.  She has had some swelling for a number of years, but has never been treated.  She suffered a fall at [facility] in March and fractured her righ shoulder resulting in a Right reverse total Shoulder.  After that she noticed more swelling in the right UE and was referred by Dr. Griffin Basil for lymphedema treatment. She is also diabetic and has hypertension. She has LBP, prior cervical fusion, OA. She is using a rollator    Patient Stated Goals Decrease swelling in arm, gain information from provider    Currently in Pain? No/denies    Multiple Pain Sites No                   LYMPHEDEMA/ONCOLOGY QUESTIONNAIRE - 02/11/21 0001       Right Upper Extremity Lymphedema   15 cm Proximal to Olecranon  Process 46.5 cm    Olecranon Process 30.1 cm    15 cm Proximal to Ulnar Styloid Process 28 cm    10 cm Proximal to Ulnar Styloid Process 25 cm    Just Proximal to Ulnar Styloid Process 19.8 cm    Across Hand at [employer] 19.1 cm    At [place] of 2nd Digit 6.2 cm                        [facility] Adult PT Treatment/Exercise - 02/11/21 0001       Manual Therapy   Manual Lymphatic Drainage (MLD) In supine with head of bed elevated: short neck, 5 diaphragmatic breaths, left axillary LN's, right inguinal LN's, anterior interaxillary pathway, right axillo inguinal pathway and right UE starting proximally and working distally, retracing all steps.    Compression Bandaging Pt washed arm at sink after removing bandages,  Large TG soft turned inside out due to skin irritation at upper arm, and medium at lower arm.  Artiflex applied wrist to axilla.  fingers 1-4 wrapped with elastomull, 6 cm to hand and wrist, 10 cm wrist to mid upper arm, and 12 cm  in herringbone style from mid forearm to axilla. 12 cm wrist to axilla. TG soft pulled down over top of bandage                         PT Long Term Goals - 02/04/21 1208       PT LONG TERM GOAL #1   Title Pt will have decreased edema  at 15 cm prox to ulna and olecranon by 2-4 cm    Time 6    Period Weeks    Status New    Target Date 03/18/21      PT LONG TERM GOAL #2   Title Pts husband will be independent with compression bandaging to help reduce right UE swelling.    Time 6    Period Weeks    Status New    Target Date 03/18/21      PT LONG TERM GOAL #3   Title Pt will be fit for appropiate compression garments and will be independent with husbands help in their use    Time 6    Status New    Target Date 03/18/21      PT LONG TERM GOAL #4   Title Pt will be educated in skin care, and precautions for lymphedema    Time 6    Period Weeks    Status New    Target Date 03/18/21                   Plan - 02/11/21 1208     Clinical Impression Statement Remeasured pt's circumferences and pt demonstrates a decrease throughout RUE except at 15 cm proximal to olecranon which may have been due to the bandages loosening since they have been on since Friday. Pt has been compliant with bandaging and left bandaging intact until her appointment. Continued with MLD to RUE and compression bandaging. Will reassess circumference at 15 cm proximal to olecranon to see if changes need to be made to bandaging routine to help decrease this area.    PT Frequency 3x / week    PT Duration 6 weeks    PT Treatment/Interventions ADLs/Self Care Home Management;Therapeutic exercise;Manual techniques;Orthotic Fit/Training;Patient/family education;Manual  lymph drainage;Compression bandaging;Scar mobilization;Passive range of motion    PT Next  Visit Plan how did skin do with thick stockinette turned inside out? initiate CDP, see how extra wrap did, MLD, compression bandaging, May include AAROM, PROM,strength for right UE.  (per MD;no limitations), may need to add comprilan soft    Consulted and Agree with Plan of Care Patient             Patient will benefit from skilled therapeutic intervention in order to improve the following deficits and impairments:  Decreased range of motion, Decreased scar mobility, Decreased strength, Increased edema, Postural dysfunction, Impaired UE functional use  Visit Diagnosis: Lymphedema, not elsewhere classified  Stiffness of right shoulder, not elsewhere classified  Aftercare following surgery for neoplasm  Muscle weakness (generalized)     Problem List Patient Active Problem List   Diagnosis Date Noted   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   Allergic rhinitis due to animal (cat) (dog) hair and dander 07/18/2020   Leiomyoma 07/18/2020   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   Anxiety state    Acute blood loss anemia    Postoperative pain    Cervical myelopathy ([place]) 09/22/2019   S/P cervical spinal fusion 09/22/2019   Cervical spondylosis with myelopathy and radiculopathy 09/15/2019   AKI (acute kidney injury) ([place]) 09/14/2019   GERD (gastroesophageal reflux disease) 09/14/2019   Spinal stenosis in cervical region 09/14/2019   Weakness 09/14/2019   Numbness and tingling 09/14/2019   Cervical spinal stenosis 09/14/2019   Status post cataract extraction and insertion of intraocular lens of left eye 09/04/2019   Lumbar stenosis 08/24/2019   Allergic rhinitis due to pollen 06/06/2019   Persistent cough 06/06/2019   History of Descemet membrane endothelial keratoplasty (DMEK) 01/04/2019   Status post cataract extraction and insertion of  intraocular lens of right eye 01/04/2019   Senile nuclear sclerosis 01/02/2019   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   Anatomical narrow angle, 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   Obesity, morbid, BMI 40.0-49.9 ([place]) 04/19/2018   Morbid obesity ([place]) 04/19/2018   Cataract    Fibroid    Endometriosis    DM (diabetes mellitus) type II controlled with renal manifestation ([place]) 06/17/2010   Dyslipidemia 06/17/2010   Microcytic anemia 06/17/2010   Essential hypertension 06/17/2010   Allergic rhinitis 06/17/2010   Osteoarthritis 06/17/2010   LOW BACK PAIN 06/17/2010   OSTEOPENIA 06/17/2010   BREAST CANCER, HX OF 06/17/2010   Disorder of skeletal system 06/17/2010    Allyson Sabal [facility] 02/11/2021, 12:13 PM  [place], [place], [address] Phone: [phone]   Fax:  [phone]  Name: Doris Jackson MRN: [id number] Date of Birth: [birth year]  Manus Gunning, PT 02/11/21 12:13 PM  Manus Gunning, PT 02/11/21 12:13 PM

## 2021-02-13 ENCOUNTER — Ambulatory Visit: Payer: Medicare HMO

## 2021-02-13 ENCOUNTER — Other Ambulatory Visit: Payer: Self-pay

## 2021-02-13 DIAGNOSIS — I890 Lymphedema, not elsewhere classified: Secondary | ICD-10-CM | POA: Diagnosis not present

## 2021-02-13 DIAGNOSIS — Z483 Aftercare following surgery for neoplasm: Secondary | ICD-10-CM

## 2021-02-13 DIAGNOSIS — R262 Difficulty in walking, not elsewhere classified: Secondary | ICD-10-CM | POA: Diagnosis not present

## 2021-02-13 DIAGNOSIS — M6281 Muscle weakness (generalized): Secondary | ICD-10-CM

## 2021-02-13 DIAGNOSIS — M545 Low back pain, unspecified: Secondary | ICD-10-CM | POA: Diagnosis not present

## 2021-02-13 DIAGNOSIS — M25611 Stiffness of right shoulder, not elsewhere classified: Secondary | ICD-10-CM | POA: Diagnosis not present

## 2021-02-13 DIAGNOSIS — G8929 Other chronic pain: Secondary | ICD-10-CM | POA: Diagnosis not present

## 2021-02-13 NOTE — Therapy (Signed)
[place], [place], [address] Phone: [phone]   Fax:  [phone]  Physical Therapy Treatment  Patient Details  Name: Doris Jackson MRN: [id number] Date of Birth: 12-09-1940 Referring Provider (PT): Dr. Griffin Basil   Encounter Date: 02/13/2021   PT End of Session - 02/13/21 1357     Visit Number 5    Number of Visits 18    Date for PT Re-Evaluation 03/18/21    PT Start Time 1300    PT Stop Time [id number]    PT Time Calculation (min) 53 min    Activity Tolerance Patient tolerated treatment well    Behavior During Therapy [facility] for tasks assessed/performed             Past Medical History:  Diagnosis Date   ALLERGIC RHINITIS 06/17/2010   ANEMIA-NOS 06/17/2010   BREAST CANCER, HX OF 06/17/2010   Breast cancer, right ([place]) 2008   Cataract    COPD 06/17/2010   pt denies   DIABETES MELLITUS, TYPE II 06/17/2010   Endometriosis    Fibroid    FIBROID   GERD (gastroesophageal reflux disease)    HYPERLIPIDEMIA 06/17/2010   HYPERTENSION 06/17/2010   Leg swelling    LOW BACK PAIN 06/17/2010   OSTEOARTHRITIS 06/17/2010   OSTEOPENIA 06/17/2010   Weakness     Past Surgical History:  Procedure Laterality Date   ABDOMINAL HYSTERECTOMY  1988   TAH,LSO   ANTERIOR CERVICAL DECOMP/DISCECTOMY FUSION N/A 09/15/2019   Procedure: Cervical five CorpectomyANTERIOR CERVICAL DECOMPRESSION/DISCECTOMY FUSION CERVICAL THREE-CERVICAL Four;  Surgeon: Ashok Pall, MD;  Location: [place];  Service: Neurosurgery;  Laterality: N/A;   BREAST LUMPECTOMY Right 2007   LUMPECTOMY FOLLOWED BY RADIATION   COLONOSCOPY     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   Subjective Assessment - 02/13/21 1258     Subjective New wrap did well but yesterday I had plastic gloves on and it worked out alright.    Pertinent History Pt is s/p right breast lumpectomy with radiation in 2007.  She has had some swelling for a number of years, but has never been treated.  She suffered a fall at [facility] in March and fractured her righ shoulder resulting in a Right reverse total Shoulder.  After that she noticed more swelling in the right UE and was referred by Dr. Griffin Basil for lymphedema treatment. She is also diabetic and has hypertension. She has LBP, prior cervical fusion, OA. She is using a rollator                               [facility] Adult PT Treatment/Exercise - 02/13/21 0001       Manual Therapy   Manual Lymphatic Drainage (MLD) In supine with head of bed elevated: short neck, 5 diaphragmatic breaths, left axillary LN's, right  inguinal LN's, anterior interaxillary pathway, right axillo inguinal pathway and right UE starting proximally and working distally, retracing all steps.    Compression Bandaging Pt washed arm  after removing bandages, Large TG soft turned inside out due to skin irritation at upper arm, and medium at lower arm.  Artiflex applied wrist to axilla.  fingers 1-4 wrapped with elastomull, 6 cm to hand and wrist, 10 cm wrist to mid upper arm, and 12 cm  from mid forearm to axilla. 12 cm wrist to axilla. TG soft pulled down over top of bandage                         PT Long Term Goals - 02/04/21 1208       PT LONG TERM GOAL #1   Title Pt will have decreased edema  at 15 cm prox to ulna and olecranon by 2-4 cm    Time 6    Period Weeks     Status New    Target Date 03/18/21      PT LONG TERM GOAL #2   Title Pts husband will be independent with compression bandaging to help reduce right UE swelling.    Time 6    Period Weeks    Status New    Target Date 03/18/21      PT LONG TERM GOAL #3   Title Pt will be fit for appropiate compression garments and will be independent with husbands help in their use    Time 6    Status New    Target Date 03/18/21      PT LONG TERM GOAL #4   Title Pt will be educated in skin care, and precautions for lymphedema    Time 6    Period Weeks    Status New    Target Date 03/18/21                   Plan - 02/13/21 1357     Clinical Impression Statement TG soft inside out helped with itching.  Pt was able to keep her bandage intact very well from last visit and arm lookded good.  Continued MLD and compression bandaging.  Will assess upper arm next visit.    Personal Factors and Comorbidities Age;Comorbidity 3+    Comorbidities Right lumpectomy with SLNB and radiation, Recent Reverse right TSA, Cervical Fusion, LBP, OA,hypertension    Examination-Activity Limitations Lift;Reach Overhead;Locomotion Level    Examination-Participation Restrictions Cleaning    Stability/Clinical Decision Making Stable/Uncomplicated    Rehab Potential Good    PT Frequency 3x / week    PT Duration 6 weeks    PT Treatment/Interventions ADLs/Self Care Home Management;Therapeutic exercise;Manual techniques;Orthotic Fit/Training;Patient/family education;Manual lymph drainage;Compression bandaging;Scar mobilization;Passive range of motion    PT Next Visit Plan nitiate CDP, see how extra wrap did, MLD, compression bandaging, May include AAROM, PROM,strength for right UE.  (per MD;no limitations), may need to add comprilan soft    Recommended Other Services new sleeve/glove    Consulted and Agree with Plan of Care Patient             Patient will benefit from skilled therapeutic intervention in order  to improve the following deficits and impairments:  Decreased range of motion, Decreased scar mobility, Decreased strength, Increased edema, Postural dysfunction, Impaired UE functional use  Visit Diagnosis: Lymphedema, not elsewhere classified  Stiffness of right shoulder, not elsewhere classified  Aftercare following surgery for neoplasm  Muscle  weakness (generalized)     Problem List Patient Active Problem List   Diagnosis Date Noted   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   Allergic rhinitis due to animal (cat) (dog) hair and dander 07/18/2020   Leiomyoma 07/18/2020   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   Anxiety state    Acute blood loss anemia    Postoperative pain    Cervical myelopathy ([place]) 09/22/2019   S/P cervical spinal fusion 09/22/2019   Cervical spondylosis with myelopathy and radiculopathy 09/15/2019   AKI (acute kidney injury) ([place]) 09/14/2019   GERD (gastroesophageal reflux disease) 09/14/2019   Spinal stenosis in cervical region 09/14/2019   Weakness 09/14/2019   Numbness and tingling 09/14/2019   Cervical spinal stenosis 09/14/2019   Status post cataract extraction and insertion of intraocular lens of left eye 09/04/2019   Lumbar stenosis 08/24/2019   Allergic rhinitis due to pollen 06/06/2019   Persistent cough 06/06/2019   History of Descemet membrane endothelial keratoplasty (DMEK) 01/04/2019   Status post cataract extraction and insertion of intraocular lens of right eye 01/04/2019   Senile nuclear sclerosis 01/02/2019   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   Anatomical narrow angle, 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   Obesity, morbid, BMI 40.0-49.9 ([place]) 04/19/2018   Morbid obesity ([place]) 04/19/2018   Cataract    Fibroid    Endometriosis    DM (diabetes mellitus) type II controlled with renal manifestation ([place]) 06/17/2010   Dyslipidemia 06/17/2010   Microcytic anemia 06/17/2010   Essential  hypertension 06/17/2010   Allergic rhinitis 06/17/2010   Osteoarthritis 06/17/2010   LOW BACK PAIN 06/17/2010   OSTEOPENIA 06/17/2010   BREAST CANCER, HX OF 06/17/2010   Disorder of skeletal system 06/17/2010    Claris Pong 02/13/2021, 2:01 PM  [place], [place], [address] Phone: [phone]   Fax:  [phone]  Name: Doris Jackson MRN: [id number] Date of Birth: 13-Feb-1941 [name], PT 02/13/21 2:02 PM

## 2021-02-15 ENCOUNTER — Ambulatory Visit: Payer: Medicare HMO

## 2021-02-15 ENCOUNTER — Other Ambulatory Visit: Payer: Self-pay

## 2021-02-15 DIAGNOSIS — Z483 Aftercare following surgery for neoplasm: Secondary | ICD-10-CM

## 2021-02-15 DIAGNOSIS — M25611 Stiffness of right shoulder, not elsewhere classified: Secondary | ICD-10-CM | POA: Diagnosis not present

## 2021-02-15 DIAGNOSIS — R262 Difficulty in walking, not elsewhere classified: Secondary | ICD-10-CM | POA: Diagnosis not present

## 2021-02-15 DIAGNOSIS — M6281 Muscle weakness (generalized): Secondary | ICD-10-CM | POA: Diagnosis not present

## 2021-02-15 DIAGNOSIS — M545 Low back pain, unspecified: Secondary | ICD-10-CM | POA: Diagnosis not present

## 2021-02-15 DIAGNOSIS — G8929 Other chronic pain: Secondary | ICD-10-CM

## 2021-02-15 DIAGNOSIS — I890 Lymphedema, not elsewhere classified: Secondary | ICD-10-CM | POA: Diagnosis not present

## 2021-02-15 NOTE — Therapy (Signed)
[place], [place], [address] Phone: [phone]   Fax:  [phone]  Physical Therapy Treatment  Patient Details  Name: Doris Jackson MRN: [id number] Date of Birth: 08/31/1940 Referring Provider (PT): Dr. Griffin Basil   Encounter Date: 02/15/2021   PT End of Session - 02/15/21 1211     Visit Number 6    Number of Visits 18    Date for PT Re-Evaluation 03/18/21    PT Start Time 1106    PT Stop Time 1203    PT Time Calculation (min) 57 min    Activity Tolerance Patient tolerated treatment well    Behavior During Therapy [facility] for tasks assessed/performed             Past Medical History:  Diagnosis Date   ALLERGIC RHINITIS 06/17/2010   ANEMIA-NOS 06/17/2010   BREAST CANCER, HX OF 06/17/2010   Breast cancer, right ([place]) 2008   Cataract    COPD 06/17/2010   pt denies   DIABETES MELLITUS, TYPE II 06/17/2010   Endometriosis    Fibroid    FIBROID   GERD (gastroesophageal reflux disease)    HYPERLIPIDEMIA 06/17/2010   HYPERTENSION 06/17/2010   Leg swelling    LOW BACK PAIN 06/17/2010   OSTEOARTHRITIS 06/17/2010   OSTEOPENIA 06/17/2010   Weakness     Past Surgical History:  Procedure Laterality Date   ABDOMINAL HYSTERECTOMY  1988   TAH,LSO   ANTERIOR CERVICAL DECOMP/DISCECTOMY FUSION N/A 09/15/2019   Procedure: Cervical five CorpectomyANTERIOR CERVICAL DECOMPRESSION/DISCECTOMY FUSION CERVICAL THREE-CERVICAL Four;  Surgeon: Ashok Pall, MD;  Location: [place];  Service: Neurosurgery;  Laterality: N/A;   BREAST LUMPECTOMY Right 2007   LUMPECTOMY FOLLOWED BY RADIATION   COLONOSCOPY     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   Subjective Assessment - 02/15/21 1112     Subjective Wrap stayed up well and it was comfortable.  It itched just a little but I rubbed it alittle and it was fine.    Patient is accompained by: Family member                   LYMPHEDEMA/ONCOLOGY QUESTIONNAIRE - 02/15/21 0001       Right Upper Extremity Lymphedema   15 cm Proximal to Olecranon Process 43 cm   measured from anterior antecubital fossa   10 cm Proximal to Olecranon Process 46.6 cm    15 cm Proximal to Ulnar Styloid Process 28.6 cm                        [facility] Adult PT Treatment/Exercise - 02/15/21 0001       Manual Therapy   Manual Lymphatic Drainage (MLD) In supine with head of bed elevated: short neck, 5 diaphragmatic breaths, left axillary LN's, right inguinal LN's, anterior interaxillary pathway, right axillo inguinal pathway and  right UE starting proximally and working distally, retracing all steps.    Compression Bandaging Pt washed arm  after removing bandages, Large TG soft turned inside out due to skin irritation at upper arm, and medium at lower arm.  Artiflex applied wrist to axilla.  fingers 1-4 wrapped with elastomull, 6 cm to hand and wrist, 10 cm wrist to mid upper arm, and 12 cm  from mid forearm to axilla. 12 cm wrist to axilla. TG soft pulled down over top of bandage. used new bandages and gave pt other bandages to take home and wash for next visit                    PT Education - 02/15/21 1211     Education Details Pt educated on washing bandages and drying    Person(s) Educated Patient    Methods Explanation;Handout    Comprehension Verbalized understanding                 PT  Long Term Goals - 02/04/21 1208       PT LONG TERM GOAL #1   Title Pt will have decreased edema  at 15 cm prox to ulna and olecranon by 2-4 cm    Time 6    Period Weeks    Status New    Target Date 03/18/21      PT LONG TERM GOAL #2   Title Pts husband will be independent with compression bandaging to help reduce right UE swelling.    Time 6    Period Weeks    Status New    Target Date 03/18/21      PT LONG TERM GOAL #3   Title Pt will be fit for appropiate compression garments and will be independent with husbands help in their use    Time 6    Status New    Target Date 03/18/21      PT LONG TERM GOAL #4   Title Pt will be educated in skin care, and precautions for lymphedema    Time 6    Period Weeks    Status New    Target Date 03/18/21                   Plan - 02/15/21 1212     Clinical Impression Statement Wraps today still nicely intact when pt came in.  Wraps removed and given to pt so she could bring home to launder and a new set was given today.  There was mild redness at mid upper arm and forearm but pt had rubbed her arm after bandages were removed and scrubbed when washing.  Remeasured upper arm with good result at 15 cm prox to ulna.  Continued MLDd and pt was wrapped with new bandages.  Pt reminded to remove if having any real discomfort in her arm or if too tight to remove the top wrap.  She verbalized understanding    Personal Factors and Comorbidities Age;Comorbidity 3+    Comorbidities Right lumpectomy with SLNB and radiation, Recent Reverse right TSA, Cervical Fusion, LBP, OA,hypertension    Examination-Activity Limitations Lift;Reach Overhead;Locomotion Level    Examination-Participation Restrictions Cleaning    Stability/Clinical Decision Making Stable/Uncomplicated    Rehab Potential Good    PT Frequency 3x / week    PT Duration 6 weeks    PT Treatment/Interventions ADLs/Self Care Home Management;Therapeutic exercise;Manual techniques;Orthotic  Fit/Training;Patient/family education;Manual lymph drainage;Compression bandaging;Scar mobilization;Passive range of motion    PT  Next Visit Plan nitiate CDP, see how extra wrap did, MLD, compression bandaging, May include AAROM, PROM,strength for right UE.  (per MD;no limitations), may need to add comprilan soft    Consulted and Agree with Plan of Care Patient             Patient will benefit from skilled therapeutic intervention in order to improve the following deficits and impairments:  Decreased range of motion, Decreased scar mobility, Decreased strength, Increased edema, Postural dysfunction, Impaired UE functional use  Visit Diagnosis: Lymphedema, not elsewhere classified  Stiffness of right shoulder, not elsewhere classified  Aftercare following surgery for neoplasm  Muscle weakness (generalized)  Chronic low back pain, unspecified back pain laterality, unspecified whether sciatica present  Difficulty in walking, not elsewhere classified     Problem List Patient Active Problem List   Diagnosis Date Noted   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   Allergic rhinitis due to animal (cat) (dog) hair and dander 07/18/2020   Leiomyoma 07/18/2020   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   Anxiety state    Acute blood loss anemia    Postoperative pain    Cervical myelopathy ([place]) 09/22/2019   S/P cervical spinal fusion 09/22/2019   Cervical spondylosis with myelopathy and radiculopathy 09/15/2019   AKI (acute kidney injury) ([place]) 09/14/2019   GERD (gastroesophageal reflux disease) 09/14/2019   Spinal stenosis in cervical region 09/14/2019   Weakness 09/14/2019   Numbness and tingling 09/14/2019   Cervical spinal stenosis 09/14/2019   Status post cataract extraction and insertion of intraocular lens of left eye 09/04/2019   Lumbar stenosis 08/24/2019   Allergic rhinitis due to pollen 06/06/2019   Persistent cough  06/06/2019   History of Descemet membrane endothelial keratoplasty (DMEK) 01/04/2019   Status post cataract extraction and insertion of intraocular lens of right eye 01/04/2019   Senile nuclear sclerosis 01/02/2019   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   Anatomical narrow angle, 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   Obesity, morbid, BMI 40.0-49.9 ([place]) 04/19/2018   Morbid obesity ([place]) 04/19/2018   Cataract    Fibroid    Endometriosis    DM (diabetes mellitus) type II controlled with renal manifestation ([place]) 06/17/2010   Dyslipidemia 06/17/2010   Microcytic anemia 06/17/2010   Essential hypertension 06/17/2010   Allergic rhinitis 06/17/2010   Osteoarthritis 06/17/2010   LOW BACK PAIN 06/17/2010   OSTEOPENIA 06/17/2010   BREAST CANCER, HX OF 06/17/2010   Disorder of skeletal system 06/17/2010    Doris Jackson 02/15/2021, 12:17 PM  [place], [place], [address] Phone: [phone]   Fax:  [phone]  Name: Doris Jackson MRN: [id number] Date of Birth: 1941/05/26  [name], PT 02/15/21 12:18 PM

## 2021-02-15 NOTE — Patient Instructions (Signed)

## 2021-02-18 ENCOUNTER — Ambulatory Visit: Payer: Medicare HMO

## 2021-02-18 ENCOUNTER — Other Ambulatory Visit: Payer: Self-pay

## 2021-02-18 DIAGNOSIS — Z483 Aftercare following surgery for neoplasm: Secondary | ICD-10-CM | POA: Diagnosis not present

## 2021-02-18 DIAGNOSIS — M545 Low back pain, unspecified: Secondary | ICD-10-CM | POA: Diagnosis not present

## 2021-02-18 DIAGNOSIS — M6281 Muscle weakness (generalized): Secondary | ICD-10-CM

## 2021-02-18 DIAGNOSIS — G8929 Other chronic pain: Secondary | ICD-10-CM

## 2021-02-18 DIAGNOSIS — I890 Lymphedema, not elsewhere classified: Secondary | ICD-10-CM

## 2021-02-18 DIAGNOSIS — R262 Difficulty in walking, not elsewhere classified: Secondary | ICD-10-CM | POA: Diagnosis not present

## 2021-02-18 DIAGNOSIS — M25611 Stiffness of right shoulder, not elsewhere classified: Secondary | ICD-10-CM | POA: Diagnosis not present

## 2021-02-18 NOTE — Therapy (Signed)
[place], [place], [address] Phone: [phone]   Fax:  [phone]  Physical Therapy Treatment  Patient Details  Name: Doris Jackson MRN: [id number] Date of Birth: Dec 15, 1940 Referring Provider (PT): Dr. Griffin Basil   Encounter Date: 02/18/2021   PT End of Session - 02/18/21 1203     Visit Number 7    Number of Visits 18    Date for PT Re-Evaluation 03/18/21    PT Start Time 73    PT Stop Time 1200    PT Time Calculation (min) 58 min    Activity Tolerance Patient tolerated treatment well    Behavior During Therapy [facility] for tasks assessed/performed             Past Medical History:  Diagnosis Date   ALLERGIC RHINITIS 06/17/2010   ANEMIA-NOS 06/17/2010   BREAST CANCER, HX OF 06/17/2010   Breast cancer, right ([place]) 2008   Cataract    COPD 06/17/2010   pt denies   DIABETES MELLITUS, TYPE II 06/17/2010   Endometriosis    Fibroid    FIBROID   GERD (gastroesophageal reflux disease)    HYPERLIPIDEMIA 06/17/2010   HYPERTENSION 06/17/2010   Leg swelling    LOW BACK PAIN 06/17/2010   OSTEOARTHRITIS 06/17/2010   OSTEOPENIA 06/17/2010   Weakness     Past Surgical History:  Procedure Laterality Date   ABDOMINAL HYSTERECTOMY  1988   TAH,LSO   ANTERIOR CERVICAL DECOMP/DISCECTOMY FUSION N/A 09/15/2019   Procedure: Cervical five CorpectomyANTERIOR CERVICAL DECOMPRESSION/DISCECTOMY FUSION CERVICAL THREE-CERVICAL Four;  Surgeon: Ashok Pall, MD;  Location: [place];  Service: Neurosurgery;  Laterality: N/A;   BREAST LUMPECTOMY Right 2007   LUMPECTOMY FOLLOWED BY RADIATION   COLONOSCOPY     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   Subjective Assessment - 02/18/21 1102     Subjective Wrap stayed up well and has been comfortable.  A little itchy but not bad at all.    Pertinent History Pt is s/p right breast lumpectomy with radiation in 2007.  She has had some swelling for a number of years, but has never been treated.  She suffered a fall at [facility] in March and fractured her righ shoulder resulting in a Right reverse total Shoulder.  After that she noticed more swelling in the right UE and was referred by Dr. Griffin Basil for lymphedema treatment. She is also diabetic and has hypertension. She has LBP, prior cervical fusion, OA. She is using a rollator    Patient Stated Goals Decrease swelling in arm, gain information from provider    Currently in Pain? No/denies    Multiple Pain Sites No                               [facility] Adult PT Treatment/Exercise - 02/18/21 0001  Manual Therapy   Manual therapy comments Discussed Pump.  Pt agreed to allow me to send demographics to [employer]    Manual Lymphatic Drainage (MLD) In supine with head of bed elevated: short neck, 5 diaphragmatic breaths, left axillary LN's, right inguinal LN's, anterior interaxillary pathway, right axillo inguinal pathway and right UE starting proximally and working distally, retracing all steps.    Compression Bandaging Pt washed arm  after removing bandages, New Large TG soft turned inside out due to skin irritation at upper arm, and new medium at lower arm.  10 cm soft comprilan foam applied wrist to axilla.  fingers 1-4 wrapped with elastomull, 6 cm to hand and wrist, 10 cm wrist to mid upper arm, and 12 cm  from mid forearm to axilla. 12 cm wrist to axilla.  TG soft pulled down over top of bandage.                         PT Long Term Goals - 02/04/21 1208       PT LONG TERM GOAL #1   Title Pt will have decreased edema  at 15 cm prox to ulna and olecranon by 2-4 cm    Time 6    Period Weeks    Status New    Target Date 03/18/21      PT LONG TERM GOAL #2   Title Pts husband will be independent with compression bandaging to help reduce right UE swelling.    Time 6    Period Weeks    Status New    Target Date 03/18/21      PT LONG TERM GOAL #3   Title Pt will be fit for appropiate compression garments and will be independent with husbands help in their use    Time 6    Status New    Target Date 03/18/21      PT LONG TERM GOAL #4   Title Pt will be educated in skin care, and precautions for lymphedema    Time 6    Period Weeks    Status New    Target Date 03/18/21                   Plan - 02/18/21 1204     Clinical Impression Statement Wraps still nicely intact when pt came in today.  Removed wraps and pt washed her arm. Lotion was applied.  Continued MLD with head of table elevated.  pt was then rewrapped with new TG soft at upper arm and forearm. Used a comprilan soft toll today from wrist to axilla instread of artiflex to see if swelling might improve more.  Will remeasure on Wednesday. Pt has good tolerance for wraps and arm was not as red today.    Personal Factors and Comorbidities Age;Comorbidity 3+    Comorbidities Right lumpectomy with SLNB and radiation, Recent Reverse right TSA, Cervical Fusion, LBP, OA,hypertension    Examination-Activity Limitations Lift;Reach Overhead;Locomotion Level    Examination-Participation Restrictions Cleaning    Stability/Clinical Decision Making Stable/Uncomplicated    Clinical Decision Making Low    Rehab Potential Good    PT Frequency 3x / week    PT Duration 6 weeks    PT Treatment/Interventions ADLs/Self Care Home Management;Therapeutic exercise;Manual  techniques;Orthotic Fit/Training;Patient/family education;Manual lymph drainage;Compression bandaging;Scar mobilization;Passive range of motion    PT Next Visit Plan see how comprilan soft was,initiate CDP, see how extra wrap did, MLD, compression bandaging, May include AAROM,  PROM,strength for right UE.  (per MD;no limitations),    Consulted and Agree with Plan of Care Patient             Patient will benefit from skilled therapeutic intervention in order to improve the following deficits and impairments:  Decreased range of motion, Decreased scar mobility, Decreased strength, Increased edema, Postural dysfunction, Impaired UE functional use  Visit Diagnosis: Lymphedema, not elsewhere classified  Stiffness of right shoulder, not elsewhere classified  Aftercare following surgery for neoplasm  Muscle weakness (generalized)  Chronic low back pain, unspecified back pain laterality, unspecified whether sciatica present  Difficulty in walking, not elsewhere classified     Problem List Patient Active Problem List   Diagnosis Date Noted   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   Allergic rhinitis due to animal (cat) (dog) hair and dander 07/18/2020   Leiomyoma 07/18/2020   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   Anxiety state    Acute blood loss anemia    Postoperative pain    Cervical myelopathy (HCC) 09/22/2019   S/P cervical spinal fusion 09/22/2019   Cervical spondylosis with myelopathy and radiculopathy 09/15/2019   AKI (acute kidney injury) ([place]) 09/14/2019   GERD (gastroesophageal reflux disease) 09/14/2019   Spinal stenosis in cervical region 09/14/2019   Weakness 09/14/2019   Numbness and tingling 09/14/2019   Cervical spinal stenosis 09/14/2019   Status post cataract extraction and insertion of intraocular lens of left eye 09/04/2019   Lumbar stenosis 08/24/2019   Allergic rhinitis due to pollen 06/06/2019    Persistent cough 06/06/2019   History of Descemet membrane endothelial keratoplasty (DMEK) 01/04/2019   Status post cataract extraction and insertion of intraocular lens of right eye 01/04/2019   Senile nuclear sclerosis 01/02/2019   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   Anatomical narrow angle, 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   Obesity, morbid, BMI 40.0-49.9 ([place]) 04/19/2018   Morbid obesity ([place]) 04/19/2018   Cataract    Fibroid    Endometriosis    DM (diabetes mellitus) type II controlled with renal manifestation ([place]) 06/17/2010   Dyslipidemia 06/17/2010   Microcytic anemia 06/17/2010   Essential hypertension 06/17/2010   Allergic rhinitis 06/17/2010   Osteoarthritis 06/17/2010   LOW BACK PAIN 06/17/2010   OSTEOPENIA 06/17/2010   BREAST CANCER, HX OF 06/17/2010   Disorder of skeletal system 06/17/2010    Claris Pong 02/18/2021, 12:08 PM  [place], [place], [address] Phone: [phone]   Fax:  [phone]  Name: JAVETTA PITKIN MRN: [id number] Date of Birth: [birth year] [name], PT 02/18/21 12:09 PM

## 2021-02-20 ENCOUNTER — Ambulatory Visit: Payer: Medicare HMO

## 2021-02-20 ENCOUNTER — Other Ambulatory Visit: Payer: Self-pay

## 2021-02-20 DIAGNOSIS — R262 Difficulty in walking, not elsewhere classified: Secondary | ICD-10-CM | POA: Diagnosis not present

## 2021-02-20 DIAGNOSIS — I890 Lymphedema, not elsewhere classified: Secondary | ICD-10-CM | POA: Diagnosis not present

## 2021-02-20 DIAGNOSIS — Z483 Aftercare following surgery for neoplasm: Secondary | ICD-10-CM | POA: Diagnosis not present

## 2021-02-20 DIAGNOSIS — M25611 Stiffness of right shoulder, not elsewhere classified: Secondary | ICD-10-CM

## 2021-02-20 DIAGNOSIS — G8929 Other chronic pain: Secondary | ICD-10-CM | POA: Diagnosis not present

## 2021-02-20 DIAGNOSIS — M545 Low back pain, unspecified: Secondary | ICD-10-CM | POA: Diagnosis not present

## 2021-02-20 DIAGNOSIS — M6281 Muscle weakness (generalized): Secondary | ICD-10-CM | POA: Diagnosis not present

## 2021-02-20 NOTE — Therapy (Signed)
[place], [place], [address] Phone: [phone]   Fax:  [phone]  Physical Therapy Treatment  Patient Details  Name: Doris Jackson MRN: [id number] Date of Birth: Mar 02, 1941 Referring Provider (PT): Dr. Griffin Basil   Encounter Date: 02/20/2021   PT End of Session - 02/20/21 1201     Visit Number 8    Number of Visits 18    Date for PT Re-Evaluation 03/18/21    PT Start Time 1103    PT Stop Time [id number]    PT Time Calculation (min) 53 min    Activity Tolerance Patient tolerated treatment well    Behavior During Therapy [facility] for tasks assessed/performed             Past Medical History:  Diagnosis Date   ALLERGIC RHINITIS 06/17/2010   ANEMIA-NOS 06/17/2010   BREAST CANCER, HX OF 06/17/2010   Breast cancer, right ([place]) 2008   Cataract    COPD 06/17/2010   pt denies   DIABETES MELLITUS, TYPE II 06/17/2010   Endometriosis    Fibroid    FIBROID   GERD (gastroesophageal reflux disease)    HYPERLIPIDEMIA 06/17/2010   HYPERTENSION 06/17/2010   Leg swelling    LOW BACK PAIN 06/17/2010   OSTEOARTHRITIS 06/17/2010   OSTEOPENIA 06/17/2010   Weakness     Past Surgical History:  Procedure Laterality Date   ABDOMINAL HYSTERECTOMY  1988   TAH,LSO   ANTERIOR CERVICAL DECOMP/DISCECTOMY FUSION N/A 09/15/2019   Procedure: Cervical five CorpectomyANTERIOR CERVICAL DECOMPRESSION/DISCECTOMY FUSION CERVICAL THREE-CERVICAL Four;  Surgeon: Ashok Pall, MD;  Location: [place];  Service: Neurosurgery;  Laterality: N/A;   BREAST LUMPECTOMY Right 2007   LUMPECTOMY FOLLOWED BY RADIATION   COLONOSCOPY     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   Subjective Assessment - 02/20/21 1110     Subjective wrap stayed up well and stayed tight.  Its a little sore around my wrist . [employer] contacted her and said she qualifies for a compression pump.  Will see her tomorrow   Pertinent History Pt is s/p right breast lumpectomy with radiation in 2007.  She has had some swelling for a number of years, but has never been treated.  She suffered a fall at [facility] in March and fractured her right shoulder resulting in a Right reverse total Shoulder.  After that she noticed more swelling in the right UE and was referred by Dr. Griffin Basil for lymphedema treatment. She is also diabetic and has hypertension. She has LBP, prior cervical fusion, OA. She is using a rollator    Patient Stated Goals Decrease swelling in arm, gain information from provider    Currently in Pain? No/denies    Multiple Pain Sites No                   LYMPHEDEMA/ONCOLOGY QUESTIONNAIRE - 02/20/21 0001  Right Upper Extremity Lymphedema   15 cm Proximal to Olecranon Process 42.5 cm    10 cm Proximal to Olecranon Process 45.9 cm    Olecranon Process 29.4 cm    15 cm Proximal to Ulnar Styloid Process 27.6 cm    10 cm Proximal to Ulnar Styloid Process 26 cm    Just Proximal to Ulnar Styloid Process 19.8 cm    Across Hand at [employer] 19.7 cm    At [place] of 2nd Digit 6.2 cm                        [facility] Adult PT Treatment/Exercise - 02/20/21 0001       Manual Therapy   Manual therapy comments pt reports received a call from pump company.  going to come to her house tomorrow    Manual Lymphatic Drainage (MLD) In supine with head of bed elevated: short neck, 5  diaphragmatic breaths, left axillary LN's, right inguinal LN's, anterior interaxillary pathway, right axillo inguinal pathway and right UE starting proximally and working distally, retracing all steps.    Compression Bandaging Pt washed arm  after removing bandages Large TG soft turned inside out due to skin irritation at upper arm, and  medium at lower arm.  10 cm soft comprilan foam applied wrist to axilla.  fingers 1-4 wrapped with elastomull, 6 cm to hand and wrist, 10 cm wrist to mid upper arm, and 12 cm  from mid forearm to axilla. 12 cm wrist to axilla. TG soft pulled down over top of bandage.                         PT Long Term Goals - 02/04/21 1208       PT LONG TERM GOAL #1   Title Pt will have decreased edema  at 15 cm prox to ulna and olecranon by 2-4 cm    Time 6    Period Weeks    Status New    Target Date 03/18/21      PT LONG TERM GOAL #2   Title Pts husband will be independent with compression bandaging to help reduce right UE swelling.    Time 6    Period Weeks    Status New    Target Date 03/18/21      PT LONG TERM GOAL #3   Title Pt will be fit for appropiate compression garments and will be independent with husbands help in their use    Time 6    Status New    Target Date 03/18/21      PT LONG TERM GOAL #4   Title Pt will be educated in skin care, and precautions for lymphedema    Time 6    Period Weeks    Status New    Target Date 03/18/21                   Plan - 02/20/21 1201     Clinical Impression Statement Wraps still nicely intact.  Pt was contacted by [employer] and they will meet at her house tomorrow.  Pt remeasured with good reduction at 15 cm prox to olecranon.  Mild increases at hand and wrist.  Performed MLD and pt rewrapped by PT. Pt toleates good pressure. Will likely be fit for sleeve next week    Personal Factors and Comorbidities Age;Comorbidity 3+    Comorbidities Right lumpectomy with SLNB  and radiation,  Recent Reverse right TSA, Cervical Fusion, LBP, OA,hypertension    Examination-Activity Limitations Lift;Reach Overhead;Locomotion Level    Stability/Clinical Decision Making Stable/Uncomplicated    Rehab Potential Good    PT Frequency 3x / week    PT Duration 6 weeks    PT Treatment/Interventions ADLs/Self Care Home Management;Therapeutic exercise;Manual techniques;Orthotic Fit/Training;Patient/family education;Manual lymph drainage;Compression bandaging;Scar mobilization;Passive range of motion    PT Next Visit Plan CDP, see how extra wrap did, MLD, compression bandaging, May include AAROM, PROM,strength for right UE.  (per MD;no limitations),    Consulted and Agree with Plan of Care Patient             Patient will benefit from skilled therapeutic intervention in order to improve the following deficits and impairments:  Decreased range of motion, Decreased scar mobility, Decreased strength, Increased edema, Postural dysfunction, Impaired UE functional use  Visit Diagnosis: Lymphedema, not elsewhere classified  Stiffness of right shoulder, not elsewhere classified  Aftercare following surgery for neoplasm  Muscle weakness (generalized)  Chronic low back pain, unspecified back pain laterality, unspecified whether sciatica present  Difficulty in walking, not elsewhere classified     Problem List Patient Active Problem List   Diagnosis Date Noted   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   Allergic rhinitis due to animal (cat) (dog) hair and dander 07/18/2020   Leiomyoma 07/18/2020   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   Anxiety state    Acute blood loss anemia    Postoperative pain    Cervical myelopathy ([place]) 09/22/2019   S/P cervical spinal fusion 09/22/2019   Cervical spondylosis with myelopathy and radiculopathy 09/15/2019   AKI (acute kidney injury) ([place]) 09/14/2019   GERD (gastroesophageal reflux disease)  09/14/2019   Spinal stenosis in cervical region 09/14/2019   Weakness 09/14/2019   Numbness and tingling 09/14/2019   Cervical spinal stenosis 09/14/2019   Status post cataract extraction and insertion of intraocular lens of left eye 09/04/2019   Lumbar stenosis 08/24/2019   Allergic rhinitis due to pollen 06/06/2019   Persistent cough 06/06/2019   History of Descemet membrane endothelial keratoplasty (DMEK) 01/04/2019   Status post cataract extraction and insertion of intraocular lens of right eye 01/04/2019   Senile nuclear sclerosis 01/02/2019   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   Anatomical narrow angle, 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   Obesity, morbid, BMI 40.0-49.9 ([place]) 04/19/2018   Morbid obesity ([place]) 04/19/2018   Cataract    Fibroid    Endometriosis    DM (diabetes mellitus) type II controlled with renal manifestation ([place]) 06/17/2010   Dyslipidemia 06/17/2010   Microcytic anemia 06/17/2010   Essential hypertension 06/17/2010   Allergic rhinitis 06/17/2010   Osteoarthritis 06/17/2010   LOW BACK PAIN 06/17/2010   OSTEOPENIA 06/17/2010   BREAST CANCER, HX OF 06/17/2010   Disorder of skeletal system 06/17/2010    Claris Pong 02/20/2021, 12:04 PM  [place], [place], [address] Phone: [phone]   Fax:  [phone]  Name: REO FERRENCE MRN: [id number] Date of Birth: [birth year] [name], PT 02/20/21 12:06 PM

## 2021-02-22 ENCOUNTER — Telehealth: Payer: Self-pay | Admitting: Family Medicine

## 2021-02-22 ENCOUNTER — Other Ambulatory Visit: Payer: Self-pay

## 2021-02-22 ENCOUNTER — Ambulatory Visit: Payer: Medicare HMO

## 2021-02-22 DIAGNOSIS — R262 Difficulty in walking, not elsewhere classified: Secondary | ICD-10-CM

## 2021-02-22 DIAGNOSIS — M6281 Muscle weakness (generalized): Secondary | ICD-10-CM

## 2021-02-22 DIAGNOSIS — M545 Low back pain, unspecified: Secondary | ICD-10-CM

## 2021-02-22 DIAGNOSIS — I890 Lymphedema, not elsewhere classified: Secondary | ICD-10-CM

## 2021-02-22 DIAGNOSIS — Z483 Aftercare following surgery for neoplasm: Secondary | ICD-10-CM | POA: Diagnosis not present

## 2021-02-22 DIAGNOSIS — M25611 Stiffness of right shoulder, not elsewhere classified: Secondary | ICD-10-CM

## 2021-02-22 DIAGNOSIS — G8929 Other chronic pain: Secondary | ICD-10-CM | POA: Diagnosis not present

## 2021-02-22 NOTE — Therapy (Signed)
[place], [place], [address] Phone: [phone]   Fax:  [phone]  Physical Therapy Treatment  Patient Details  Name: Doris Jackson MRN: [id number] Date of Birth: [birth year] Referring Provider (PT): Dr. Griffin Basil   Encounter Date: 02/22/2021   PT End of Session - 02/22/21 1217     Visit Number 9    Number of Visits 18    Date for PT Re-Evaluation 03/18/21    PT Start Time 1103    PT Stop Time 1201    PT Time Calculation (min) 58 min    Activity Tolerance Patient tolerated treatment well    Behavior During Therapy [facility] for tasks assessed/performed             Past Medical History:  Diagnosis Date   ALLERGIC RHINITIS 06/17/2010   ANEMIA-NOS 06/17/2010   BREAST CANCER, HX OF 06/17/2010   Breast cancer, right ([place]) 2008   Cataract    COPD 06/17/2010   pt denies   DIABETES MELLITUS, TYPE II 06/17/2010   Endometriosis    Fibroid    FIBROID   GERD (gastroesophageal reflux disease)    HYPERLIPIDEMIA 06/17/2010   HYPERTENSION 06/17/2010   Leg swelling    LOW BACK PAIN 06/17/2010   OSTEOARTHRITIS 06/17/2010   OSTEOPENIA 06/17/2010   Weakness     Past Surgical History:  Procedure Laterality Date   ABDOMINAL HYSTERECTOMY  1988   TAH,LSO   ANTERIOR CERVICAL DECOMP/DISCECTOMY FUSION N/A 09/15/2019   Procedure: Cervical five CorpectomyANTERIOR CERVICAL DECOMPRESSION/DISCECTOMY FUSION CERVICAL THREE-CERVICAL Four;  Surgeon: Ashok Pall, MD;  Location: [place];  Service: Neurosurgery;  Laterality: N/A;   BREAST LUMPECTOMY Right 2007   LUMPECTOMY FOLLOWED BY RADIATION   COLONOSCOPY     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   Subjective Assessment - 02/22/21 1109     Subjective Wrap did well and wasn't uncomfortable or itchy this time. The [employer] rep came and did a demonstration on me. It felt good and now i have to fill out paperwork to see if I can get help with it.    Pertinent History Pt is s/p right breast lumpectomy with radiation in 2007.  She has had some swelling for a number of years, but has never been treated.  She suffered a fall at [facility] in March and fractured her righ shoulder resulting in a Right reverse total Shoulder.  After that she noticed more swelling in the right UE and was referred by Dr. Griffin Basil for lymphedema treatment. She is also diabetic and has hypertension. She has LBP, prior cervical fusion, OA. She is using a rollator    Patient Stated Goals Decrease swelling in arm, gain information from provider    Currently in Pain? No/denies  [place] Adult PT Treatment/Exercise - 02/22/21 0001       Manual Therapy   Manual therapy comments Had flexi trial yesterday.  Has some paperwork to fill out for assistance;tried to explain to pt what items she needed to send    Manual Lymphatic Drainage (MLD) In supine with head of bed elevated: short neck, 5 diaphragmatic breaths, left axillary LN's, right inguinal LN's, anterior interaxillary pathway, right axillo inguinal pathway and right UE starting proximally and working distally, retracing all steps.    Compression Bandaging Pt washed arm  after removing bandages Large TG soft turned inside out due to skin irritation at upper arm, and  medium at lower arm.  10 cm soft comprilan foam applied wrist to axilla.  fingers 1-4  wrapped with elastomull, 6 cm to hand and wrist, 10 cm wrist to mid upper arm, and 12 cm  from mid forearm to axilla. 12 cm wrist to axilla. TG soft pulled down over top of bandage.                         PT Long Term Goals - 02/04/21 1208       PT LONG TERM GOAL #1   Title Pt will have decreased edema  at 15 cm prox to ulna and olecranon by 2-4 cm    Time 6    Period Weeks    Status New    Target Date 03/18/21      PT LONG TERM GOAL #2   Title Pts husband will be independent with compression bandaging to help reduce right UE swelling.    Time 6    Period Weeks    Status New    Target Date 03/18/21      PT LONG TERM GOAL #3   Title Pt will be fit for appropiate compression garments and will be independent with husbands help in their use    Time 6    Status New    Target Date 03/18/21      PT LONG TERM GOAL #4   Title Pt will be educated in skin care, and precautions for lymphedema    Time 6    Period Weeks    Status New    Target Date 03/18/21                   Plan - 02/22/21 1217     Clinical Impression Statement Pt was seen by Jinny Blossom at [employer] yesterday.  She was given a form to fill out to try and get some financial assistance and she had many ?? about it. Pts wraps were intact but slightly loose.  Removed wraps and pt washed her arm.  Continued MLD to the right UE with head of table elevated.  Pt was wrapped as done previously and will be measured for sleeve next Friday by [place].    Personal Factors and Comorbidities Age;Comorbidity 3+    Comorbidities Right lumpectomy with SLNB and radiation, Recent Reverse right TSA, Cervical Fusion, LBP, OA,hypertension    Examination-Activity Limitations Lift;Reach Overhead;Locomotion Level    Examination-Participation Restrictions Cleaning    Stability/Clinical Decision Making Stable/Uncomplicated    Rehab Potential Good    PT Frequency 3x / week    PT Duration 6 weeks    PT  Treatment/Interventions ADLs/Self Care Home Management;Therapeutic exercise;Manual techniques;Orthotic Fit/Training;Patient/family education;Manual lymph drainage;Compression bandaging;Scar mobilization;Passive range of motion    PT Next Visit Plan CDP, see how extra wrap did,  MLD, compression bandaging, May include AAROM, PROM,strength for right UE.  (per MD;no limitations),    Recommended Other Services sleeve/glove, possible flexitouch, night garment?    Consulted and Agree with Plan of Care Patient             Patient will benefit from skilled therapeutic intervention in order to improve the following deficits and impairments:  Decreased range of motion, Decreased scar mobility, Decreased strength, Increased edema, Postural dysfunction, Impaired UE functional use  Visit Diagnosis: Lymphedema, not elsewhere classified  Stiffness of right shoulder, not elsewhere classified  Aftercare following surgery for neoplasm  Muscle weakness (generalized)  Chronic low back pain, unspecified back pain laterality, unspecified whether sciatica present  Difficulty in walking, not elsewhere classified     Problem List Patient Active Problem List   Diagnosis Date Noted   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   Allergic rhinitis due to animal (cat) (dog) hair and dander 07/18/2020   Leiomyoma 07/18/2020   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   Anxiety state    Acute blood loss anemia    Postoperative pain    Cervical myelopathy ([facility]) 09/22/2019   S/P cervical spinal fusion 09/22/2019   Cervical spondylosis with myelopathy and radiculopathy 09/15/2019   AKI (acute kidney injury) ([place]) 09/14/2019   GERD (gastroesophageal reflux disease) 09/14/2019   Spinal stenosis in cervical region 09/14/2019   Weakness 09/14/2019   Numbness and tingling 09/14/2019   Cervical spinal stenosis 09/14/2019   Status post cataract extraction and  insertion of intraocular lens of left eye 09/04/2019   Lumbar stenosis 08/24/2019   Allergic rhinitis due to pollen 06/06/2019   Persistent cough 06/06/2019   History of Descemet membrane endothelial keratoplasty (DMEK) 01/04/2019   Status post cataract extraction and insertion of intraocular lens of right eye 01/04/2019   Senile nuclear sclerosis 01/02/2019   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   Anatomical narrow angle, 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   Obesity, morbid, BMI 40.0-49.9 ([place]) 04/19/2018   Morbid obesity ([place]) 04/19/2018   Cataract    Fibroid    Endometriosis    DM (diabetes mellitus) type II controlled with renal manifestation ([place]) 06/17/2010   Dyslipidemia 06/17/2010   Microcytic anemia 06/17/2010   Essential hypertension 06/17/2010   Allergic rhinitis 06/17/2010   Osteoarthritis 06/17/2010   LOW BACK PAIN 06/17/2010   OSTEOPENIA 06/17/2010   BREAST CANCER, HX OF 06/17/2010   Disorder of skeletal system 06/17/2010    Claris Pong 02/22/2021, 12:23 PM  [place], [place], [address] Phone: [phone]   Fax:  [phone]  Name: Doris Jackson MRN: [id number] Date of Birth: January 15, 1941  [name], PT 02/22/21 12:25 PM

## 2021-02-22 NOTE — Telephone Encounter (Signed)
Lovena Le from [employer] to check on a fax sent yesterday for arm compression pump for Patient.     Good callback number is [phone]   Please Advise

## 2021-02-24 ENCOUNTER — Other Ambulatory Visit: Payer: Self-pay | Admitting: Family Medicine

## 2021-02-25 ENCOUNTER — Ambulatory Visit: Payer: Medicare HMO

## 2021-02-25 ENCOUNTER — Other Ambulatory Visit: Payer: Self-pay

## 2021-02-25 DIAGNOSIS — I890 Lymphedema, not elsewhere classified: Secondary | ICD-10-CM | POA: Diagnosis not present

## 2021-02-25 DIAGNOSIS — Z483 Aftercare following surgery for neoplasm: Secondary | ICD-10-CM | POA: Diagnosis not present

## 2021-02-25 DIAGNOSIS — G8929 Other chronic pain: Secondary | ICD-10-CM | POA: Diagnosis not present

## 2021-02-25 DIAGNOSIS — M25611 Stiffness of right shoulder, not elsewhere classified: Secondary | ICD-10-CM

## 2021-02-25 DIAGNOSIS — M6281 Muscle weakness (generalized): Secondary | ICD-10-CM | POA: Diagnosis not present

## 2021-02-25 DIAGNOSIS — R262 Difficulty in walking, not elsewhere classified: Secondary | ICD-10-CM | POA: Diagnosis not present

## 2021-02-25 DIAGNOSIS — M545 Low back pain, unspecified: Secondary | ICD-10-CM | POA: Diagnosis not present

## 2021-02-25 NOTE — Therapy (Signed)
[place], [place], [address] Phone: [phone]   Fax:  [phone]  Physical Therapy Treatment  Patient Details  Name: Doris Jackson MRN: [id number] Date of Birth: 1941-03-21 Referring Provider (PT): Dr. Griffin Basil   Encounter Date: 02/25/2021   PT End of Session - 02/25/21 1156     Visit Number 10    Number of Visits 18    Date for PT Re-Evaluation 03/18/21    PT Start Time 1104    PT Stop Time 1151    PT Time Calculation (min) 47 min    Activity Tolerance Patient tolerated treatment well    Behavior During Therapy [facility] for tasks assessed/performed             Past Medical History:  Diagnosis Date   ALLERGIC RHINITIS 06/17/2010   ANEMIA-NOS 06/17/2010   BREAST CANCER, HX OF 06/17/2010   Breast cancer, right ([place]) 2008   Cataract    COPD 06/17/2010   pt denies   DIABETES MELLITUS, TYPE II 06/17/2010   Endometriosis    Fibroid    FIBROID   GERD (gastroesophageal reflux disease)    HYPERLIPIDEMIA 06/17/2010   HYPERTENSION 06/17/2010   Leg swelling    LOW BACK PAIN 06/17/2010   OSTEOARTHRITIS 06/17/2010   OSTEOPENIA 06/17/2010   Weakness     Past Surgical History:  Procedure Laterality Date   ABDOMINAL HYSTERECTOMY  1988   TAH,LSO   ANTERIOR CERVICAL DECOMP/DISCECTOMY FUSION N/A 09/15/2019   Procedure: Cervical five CorpectomyANTERIOR CERVICAL DECOMPRESSION/DISCECTOMY FUSION CERVICAL THREE-CERVICAL Four;  Surgeon: Ashok Pall, MD;  Location: [place];  Service: Neurosurgery;  Laterality: N/A;   BREAST LUMPECTOMY Right 2007   LUMPECTOMY FOLLOWED BY RADIATION   COLONOSCOPY     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   Subjective Assessment - 02/25/21 1117     Subjective I bought the paperwork for you to help me with.  Wrap stayed on OK over the weekend but got a little loose at wrist   Pertinent History Pt is s/p right breast lumpectomy with radiation in 2007.  She has had some swelling for a number of years, but has never been treated.  She suffered a [facility] and fractured her righ shoulder resulting in a Right reverse total Shoulder.  After that she noticed more swelling in the right UE and was referred by Dr. Griffin Basil for lymphedema treatment. She is also diabetic and has hypertension. She has LBP, prior cervical fusion, OA. She is using a rollator    Patient Stated Goals Decrease swelling in arm, gain information from provider    Currently in Pain? No/denies    Multiple Pain Sites No                               [facility]  Adult PT Treatment/Exercise - 02/25/21 0001       Manual Therapy   Manual Lymphatic Drainage (MLD) In supine with head of bed elevated: short neck, 5 diaphragmatic breaths, left axillary LN's, right inguinal LN's, anterior interaxillary pathway, right axillo inguinal pathway and right UE starting proximally and working distally, retracing all steps.    Compression Bandaging Pt washed arm  after removing bandages Large TG soft turned inside out due to skin irritation at upper arm, and  medium at lower arm.  10 cm soft comprilan foam applied wrist to axilla.  fingers 1-4 wrapped with elastomull, 6 cm to hand and wrist, 10 cm wrist to mid upper arm, and 12 cm  from mid forearm to axilla. 12 cm wrist to axilla. TG soft pulled down over top of bandage.                          PT Long Term Goals - 02/04/21 1208       PT LONG TERM GOAL #1   Title Pt will have decreased edema  at 15 cm prox to ulna and olecranon by 2-4 cm    Time 6    Period Weeks    Status New    Target Date 03/18/21      PT LONG TERM GOAL #2   Title Pts husband will be independent with compression bandaging to help reduce right UE swelling.    Time 6    Period Weeks    Status New    Target Date 03/18/21      PT LONG TERM GOAL #3   Title Pt will be fit for appropiate compression garments and will be independent with husbands help in their use    Time 6    Status New    Target Date 03/18/21      PT LONG TERM GOAL #4   Title Pt will be educated in skin care, and precautions for lymphedema    Time 6    Period Weeks    Status New    Target Date 03/18/21                   Plan - 02/25/21 1157     Clinical Impression Statement Pt came in wrapped and was unwrapped by [place].  Arm was washed and lotion applied.  Continued MLD and compression bandaging as done previously.  Pt has good tolerance for wraps and will be measured on Friday for compression sleeve. Pt bought in paperwork to fill out to try and get assistance for flexitouch.  Gave pt some guidance as far as what she needs to send.    Personal Factors and Comorbidities Age;Comorbidity 3+    Comorbidities Right lumpectomy with SLNB and radiation, Recent Reverse right TSA, Cervical Fusion, LBP, OA,hypertension    Examination-Activity Limitations Lift;Reach Overhead;Locomotion Level    Examination-Participation Restrictions Cleaning    Stability/Clinical Decision Making Stable/Uncomplicated    Rehab Potential Good    PT Frequency 3x / week    PT Duration 6 weeks    PT Treatment/Interventions ADLs/Self Care Home Management;Therapeutic exercise;Manual techniques;Orthotic Fit/Training;Patient/family education;Manual lymph drainage;Compression bandaging;Scar mobilization;Passive range of motion    PT  Next Visit Plan CDP, see how extra wrap did, MLD, compression bandaging, May include AAROM, PROM,strength for right UE.  (per MD;no limitations),    Consulted and Agree with Plan of Care Patient             Patient  will benefit from skilled therapeutic intervention in order to improve the following deficits and impairments:  Decreased range of motion, Decreased scar mobility, Decreased strength, Increased edema, Postural dysfunction, Impaired UE functional use  Visit Diagnosis: Lymphedema, not elsewhere classified  Stiffness of right shoulder, not elsewhere classified  Aftercare following surgery for neoplasm  Muscle weakness (generalized)     Problem List Patient Active Problem List   Diagnosis Date Noted   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   Allergic rhinitis due to animal (cat) (dog) hair and dander 07/18/2020   Leiomyoma 07/18/2020   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   Anxiety state    Acute blood loss anemia    Postoperative pain    Cervical myelopathy ([place]) 09/22/2019   S/P cervical spinal fusion 09/22/2019   Cervical spondylosis with myelopathy and radiculopathy 09/15/2019   AKI (acute kidney injury) ([place]) 09/14/2019   GERD (gastroesophageal reflux disease) 09/14/2019   Spinal stenosis in cervical region 09/14/2019   Weakness 09/14/2019   Numbness and tingling 09/14/2019   Cervical spinal stenosis 09/14/2019   Status post cataract extraction and insertion of intraocular lens of left eye 09/04/2019   Lumbar stenosis 08/24/2019   Allergic rhinitis due to pollen 06/06/2019   Persistent cough 06/06/2019   History of Descemet membrane endothelial keratoplasty (DMEK) 01/04/2019   Status post cataract extraction and insertion of intraocular lens of right eye 01/04/2019   Senile nuclear sclerosis 01/02/2019   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   Anatomical narrow angle, 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   Obesity,  morbid, BMI 40.0-49.9 ([place]) 04/19/2018   Morbid obesity ([place]) 04/19/2018   Cataract    Fibroid    Endometriosis    DM (diabetes mellitus) type II controlled with renal manifestation ([place]) 06/17/2010   Dyslipidemia 06/17/2010   Microcytic anemia 06/17/2010   Essential hypertension 06/17/2010   Allergic rhinitis 06/17/2010   Osteoarthritis 06/17/2010   LOW BACK PAIN 06/17/2010   OSTEOPENIA 06/17/2010   BREAST CANCER, HX OF 06/17/2010   Disorder of skeletal system 06/17/2010    Claris Pong 02/25/2021, 12:02 PM  [place], [place], [address] Phone: [phone]   Fax:  [phone]  Name: Doris Jackson MRN: [id number] Date of Birth: 08-23-1940  [name], PT 02/25/21 12:03 PM

## 2021-02-25 NOTE — Telephone Encounter (Signed)
Completed and returned

## 2021-02-27 ENCOUNTER — Other Ambulatory Visit: Payer: Self-pay

## 2021-02-27 ENCOUNTER — Ambulatory Visit: Payer: Medicare HMO

## 2021-02-27 DIAGNOSIS — M6281 Muscle weakness (generalized): Secondary | ICD-10-CM | POA: Diagnosis not present

## 2021-02-27 DIAGNOSIS — I890 Lymphedema, not elsewhere classified: Secondary | ICD-10-CM | POA: Diagnosis not present

## 2021-02-27 DIAGNOSIS — M25611 Stiffness of right shoulder, not elsewhere classified: Secondary | ICD-10-CM

## 2021-02-27 DIAGNOSIS — Z483 Aftercare following surgery for neoplasm: Secondary | ICD-10-CM

## 2021-02-27 DIAGNOSIS — G8929 Other chronic pain: Secondary | ICD-10-CM | POA: Diagnosis not present

## 2021-02-27 DIAGNOSIS — M4802 Spinal stenosis, cervical region: Secondary | ICD-10-CM | POA: Diagnosis not present

## 2021-02-27 DIAGNOSIS — R262 Difficulty in walking, not elsewhere classified: Secondary | ICD-10-CM | POA: Diagnosis not present

## 2021-02-27 DIAGNOSIS — M545 Low back pain, unspecified: Secondary | ICD-10-CM | POA: Diagnosis not present

## 2021-02-27 NOTE — Therapy (Signed)
[place], [place], [address] Phone: [phone]   Fax:  [phone]  Physical Therapy Treatment  Patient Details  Name: Doris Jackson MRN: [id number] Date of Birth: 05/01/1941 Referring Provider (PT): Dr. Griffin Basil   Encounter Date: 02/27/2021   PT End of Session - 02/27/21 1210     Visit Number 11    Number of Visits 18    Date for PT Re-Evaluation 03/18/21    PT Start Time 1104    PT Stop Time 2505    PT Time Calculation (min) 54 min             Past Medical History:  Diagnosis Date   ALLERGIC RHINITIS 06/17/2010   ANEMIA-NOS 06/17/2010   BREAST CANCER, HX OF 06/17/2010   Breast cancer, right ([place]) 2008   Cataract    COPD 06/17/2010   pt denies   DIABETES MELLITUS, TYPE II 06/17/2010   Endometriosis    Fibroid    FIBROID   GERD (gastroesophageal reflux disease)    HYPERLIPIDEMIA 06/17/2010   HYPERTENSION 06/17/2010   Leg swelling    LOW BACK PAIN 06/17/2010   OSTEOARTHRITIS 06/17/2010   OSTEOPENIA 06/17/2010   Weakness     Past Surgical History:  Procedure Laterality Date   ABDOMINAL HYSTERECTOMY  1988   TAH,LSO   ANTERIOR CERVICAL DECOMP/DISCECTOMY FUSION N/A 09/15/2019   Procedure: Cervical five CorpectomyANTERIOR CERVICAL DECOMPRESSION/DISCECTOMY FUSION CERVICAL THREE-CERVICAL Four;  Surgeon: Ashok Pall, MD;  Location: [facility];  Service: Neurosurgery;  Laterality: N/A;   BREAST LUMPECTOMY Right 2007   LUMPECTOMY FOLLOWED BY RADIATION   COLONOSCOPY     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   Subjective Assessment - 02/27/21 1111     Subjective The wrap stayed up well for me.    Pertinent History Pt is s/p right breast lumpectomy with radiation in 2007.  She has had some swelling for a number of years, but has never been treated.  She suffered a fall at [facility] in March and fractured her righ shoulder resulting in a Right reverse total Shoulder.  After that she noticed more swelling in the right UE and was referred by Dr. Griffin Basil for lymphedema treatment. She is also diabetic and has hypertension. She has LBP, prior cervical fusion, OA. She is using a rollator    Currently in Pain? No/denies    Multiple Pain Sites No                   LYMPHEDEMA/ONCOLOGY QUESTIONNAIRE - 02/27/21 0001       Right Upper Extremity Lymphedema   15 cm Proximal to Olecranon Process 40.8 cm    10 cm Proximal to Olecranon Process 45.7 cm    Olecranon Process 29.2 cm    15 cm Proximal to Ulnar Styloid Process 27.8 cm    10 cm Proximal to Ulnar  Styloid Process 26.1 cm    Just Proximal to Ulnar Styloid Process 19.4 cm    Across Hand at [employer] 19.5 cm    At [place] of 2nd Digit 6.4 cm                        [facility] Adult PT Treatment/Exercise - 02/27/21 0001       Manual Therapy   Manual therapy comments pt remeasured    Manual Lymphatic Drainage (MLD) In supine with head of bed elevated: short neck, 5 diaphragmatic breaths, left axillary LN's, right inguinal LN's, anterior interaxillary pathway, right axillo inguinal pathway and right UE starting proximally and working distally, retracing all steps.    Compression Bandaging Pt washed arm  after removing bandages Large TG soft turned inside out due to skin irritation at upper arm, and  medium at lower arm.  10 cm soft comprilan foam applied wrist to axilla.   fingers 1-4 wrapped with elastomull, 6 cm to hand and wrist, 10 cm wrist to mid upper arm, and 12 cm  from mid forearm to axilla. 12 cm wrist to axilla. TG soft pulled down over top of bandage.                         PT Long Term Goals - 02/27/21 1220       PT LONG TERM GOAL #1   Title Pt will have decreased edema  at 15 cm prox to ulna and olecranon by 2-4 cm    Baseline 1.5 cm decrease 15 cm prox to ulna, 5.6 cm decrease 15 cm prox to olecranon    Time 6    Period Weeks    Status Partially Met      PT LONG TERM GOAL #2   Title Pts husband will be independent with compression bandaging to help reduce right UE swelling.    Time 6    Period Weeks    Status On-going      PT LONG TERM GOAL #3   Title Pt will be fit for appropiate compression garments and will be independent with husbands help in their use    Time 6 being measured Fri   Period Weeks    Status On-going      PT LONG TERM GOAL #4   Title Pt will be educated in skin care, and precautions for lymphedema    Status On-going                   Plan - 02/27/21 1211     Clinical Impression Statement Pts wrap removed and arm washed. Pt was remeasured today with good reduction at the upper arm.  We continued manual lymph drainage and bandaging.She is tolerating the wrap well and it stays intact for several days.  She will be measured for sleeve/night garment if possible on Friday by [place]    Personal Factors and Comorbidities Age;Comorbidity 3+    Comorbidities Right lumpectomy with SLNB and radiation, Recent Reverse right TSA, Cervical Fusion, LBP, OA,hypertension    Examination-Activity Limitations Lift;Reach Overhead;Locomotion Level    Examination-Participation Restrictions Cleaning    Stability/Clinical Decision Making Stable/Uncomplicated    Rehab Potential Good    PT Frequency 3x / week    PT Duration 6 weeks    PT Treatment/Interventions ADLs/Self Care Home Management;Therapeutic  exercise;Manual techniques;Orthotic Fit/Training;Patient/family education;Manual lymph drainage;Compression bandaging;Scar mobilization;Passive range of motion    PT Next Visit  Plan MLD, compression bandaging, measure    Consulted and Agree with Plan of Care Patient             Patient will benefit from skilled therapeutic intervention in order to improve the following deficits and impairments:  Decreased range of motion, Decreased scar mobility, Decreased strength, Increased edema, Postural dysfunction, Impaired UE functional use  Visit Diagnosis: Lymphedema, not elsewhere classified  Stiffness of right shoulder, not elsewhere classified  Aftercare following surgery for neoplasm  Muscle weakness (generalized)     Problem List Patient Active Problem List   Diagnosis Date Noted   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   Allergic rhinitis due to animal (cat) (dog) hair and dander 07/18/2020   Leiomyoma 07/18/2020   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   Anxiety state    Acute blood loss anemia    Postoperative pain    Cervical myelopathy (H[facility]) 09/22/2019   S/P cervical spinal fusion 09/22/2019   Cervical spondylosis with myelopathy and radiculopathy 09/15/2019   AKI (acute kidney injury) ([place]) 09/14/2019   GERD (gastroesophageal reflux disease) 09/14/2019   Spinal stenosis in cervical region 09/14/2019   Weakness 09/14/2019   Numbness and tingling 09/14/2019   Cervical spinal stenosis 09/14/2019   Status post cataract extraction and insertion of intraocular lens of left eye 09/04/2019   Lumbar stenosis 08/24/2019   Allergic rhinitis due to pollen 06/06/2019   Persistent cough 06/06/2019   History of Descemet membrane endothelial keratoplasty (DMEK) 01/04/2019   Status post cataract extraction and insertion of intraocular lens of right eye 01/04/2019   Senile nuclear sclerosis 01/02/2019   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   Anatomical narrow angle, 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   Obesity, morbid, BMI 40.0-49.9 ([place]) 04/19/2018   Morbid obesity ([place]) 04/19/2018   Cataract    Fibroid    Endometriosis    DM (diabetes mellitus) type II controlled with renal manifestation ([place]) 06/17/2010   Dyslipidemia 06/17/2010   Microcytic anemia 06/17/2010   Essential hypertension 06/17/2010   Allergic rhinitis 06/17/2010   Osteoarthritis 06/17/2010   LOW BACK PAIN 06/17/2010   OSTEOPENIA 06/17/2010   BREAST CANCER, HX OF 06/17/2010   Disorder of skeletal system 06/17/2010    Claris Pong 02/27/2021, 12:22 PM  [place], [place], [address] Phone: [phone]   Fax:  [phone]  Name: Doris Jackson MRN: [id number] Date of Birth: [birth year] [name], PT 02/27/21 12:23 PM

## 2021-03-01 ENCOUNTER — Ambulatory Visit: Payer: Medicare HMO | Attending: Orthopaedic Surgery | Admitting: Rehabilitation

## 2021-03-01 ENCOUNTER — Encounter: Payer: Self-pay | Admitting: Rehabilitation

## 2021-03-01 ENCOUNTER — Other Ambulatory Visit: Payer: Self-pay

## 2021-03-01 DIAGNOSIS — R262 Difficulty in walking, not elsewhere classified: Secondary | ICD-10-CM | POA: Insufficient documentation

## 2021-03-01 DIAGNOSIS — I890 Lymphedema, not elsewhere classified: Secondary | ICD-10-CM | POA: Diagnosis not present

## 2021-03-01 DIAGNOSIS — G8929 Other chronic pain: Secondary | ICD-10-CM | POA: Insufficient documentation

## 2021-03-01 DIAGNOSIS — Z483 Aftercare following surgery for neoplasm: Secondary | ICD-10-CM | POA: Diagnosis not present

## 2021-03-01 DIAGNOSIS — M6281 Muscle weakness (generalized): Secondary | ICD-10-CM | POA: Diagnosis not present

## 2021-03-01 DIAGNOSIS — M545 Low back pain, unspecified: Secondary | ICD-10-CM | POA: Insufficient documentation

## 2021-03-01 DIAGNOSIS — M25611 Stiffness of right shoulder, not elsewhere classified: Secondary | ICD-10-CM | POA: Diagnosis not present

## 2021-03-01 NOTE — Therapy (Signed)
[place], [place], [address] Phone: [phone]   Fax:  [phone]  Physical Therapy Treatment  Patient Details  Name: Doris Jackson MRN: [id number] Date of Birth: [birth year] Referring Provider (PT): Dr. Griffin Basil   Encounter Date: 03/01/2021   PT End of Session - 03/01/21 1153     Visit Number 12    Number of Visits 18    Date for PT Re-Evaluation 03/18/21    PT Start Time 1100    PT Stop Time 1150    PT Time Calculation (min) 50 min    Activity Tolerance Patient tolerated treatment well    Behavior During Therapy [facility] for tasks assessed/performed             Past Medical History:  Diagnosis Date   ALLERGIC RHINITIS 06/17/2010   ANEMIA-NOS 06/17/2010   BREAST CANCER, HX OF 06/17/2010   Breast cancer, right ([place]) 2008   Cataract    COPD 06/17/2010   pt denies   DIABETES MELLITUS, TYPE II 06/17/2010   Endometriosis    Fibroid    FIBROID   GERD (gastroesophageal reflux disease)    HYPERLIPIDEMIA 06/17/2010   HYPERTENSION 06/17/2010   Leg swelling    LOW BACK PAIN 06/17/2010   OSTEOARTHRITIS 06/17/2010   OSTEOPENIA 06/17/2010   Weakness     Past Surgical History:  Procedure Laterality Date   ABDOMINAL HYSTERECTOMY  1988   TAH,LSO   ANTERIOR CERVICAL DECOMP/DISCECTOMY FUSION N/A 09/15/2019   Procedure: Cervical five CorpectomyANTERIOR CERVICAL DECOMPRESSION/DISCECTOMY FUSION CERVICAL THREE-CERVICAL Four;  Surgeon: Ashok Pall, MD;  Location: [facility];  Service: Neurosurgery;  Laterality: N/A;   BREAST LUMPECTOMY Right 2007   LUMPECTOMY FOLLOWED BY RADIATION   COLONOSCOPY     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   Subjective Assessment - 03/01/21 1058     Subjective Here to be measured    Pertinent History Pt is s/p right breast lumpectomy with radiation in 2007.  She has had some swelling for a number of years, but has never been treated.  She suffered a [facility] in [facility] and fractured her righ shoulder resulting in a Right reverse total Shoulder.  After that she noticed more swelling in the right UE and was referred by Dr. Griffin Basil for lymphedema treatment. She is also diabetic and has hypertension. She has LBP, prior cervical fusion, OA. She is using a rollator    Currently in Pain? No/denies                               [facility] Adult PT Treatment/Exercise - 03/01/21 0001       Manual Therapy   Compression Bandaging Pt washed arm  after removing bandages Large TG soft turned inside out due to skin irritation at upper arm, and  medium  at lower arm.  10 cm soft comprilan foam applied wrist to axilla.  fingers 1-4 wrapped with elastomull, 6 cm to hand and wrist, 10 cm wrist to mid upper arm, and 12 cm  from mid forearm to axilla. 12 cm wrist to axilla. TG soft pulled down over top of bandage.      Prosthetics   Prosthetic Care Comments  pt measured for custom medi arm sleeve and glove sent over today for ordering                         PT Long Term Goals - 02/27/21 1220       PT LONG TERM GOAL #1   Title Pt will have decreased edema  at 15 cm prox to ulna and olecranon by 2-4 cm    Baseline 1.5 cm decrease 15 cm prox to ulna, 5.6 cm decrease 15 cm prox to olecranon    Time 6    Period Weeks    Status Partially Met      PT LONG TERM GOAL #2   Title Pts husband will be independent  with compression bandaging to help reduce right UE swelling.    Time 6    Period Weeks    Status On-going      PT LONG TERM GOAL #3   Title Pt will be fit for appropiate compression garments and will be independent with husbands help in their use    Time 6    Period Weeks    Status On-going      PT LONG TERM GOAL #4   Title Pt will be educated in skin care, and precautions for lymphedema    Status On-going                   Plan - 03/01/21 1153     Clinical Impression Statement Measured UE for new garment and glove x 34mn, Reapplied bandage with pt reporting comfortable upon leaving.  Pt will not be back for 5 days so she was educated on how she may need to remove the bandage by then due to comfort and effectiveness.    PT Frequency 3x / week    PT Duration 6 weeks    PT Treatment/Interventions ADLs/Self Care Home Management;Therapeutic exercise;Manual techniques;Orthotic Fit/Training;Patient/family education;Manual lymph drainage;Compression bandaging;Scar mobilization;Passive range of motion    PT Next Visit Plan MLD, compression bandaging, show pt night garment options and see if she fits in and OTS triute velcro    Consulted and Agree with Plan of Care Patient             Patient will benefit from skilled therapeutic intervention in order to improve the following deficits and impairments:     Visit Diagnosis: Lymphedema, not elsewhere classified  Stiffness of right shoulder, not elsewhere classified  Aftercare following surgery for neoplasm  Muscle weakness (generalized)     Problem List Patient Active Problem List   Diagnosis Date Noted   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   Allergic rhinitis due to animal (cat) (dog) hair and dander 07/18/2020   Leiomyoma 07/18/2020   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 ([place] 10/21/2019   Anxiety state    Acute blood loss anemia    Postoperative pain    Cervical  myelopathy ([place] 09/22/2019   S/P cervical spinal fusion 09/22/2019   Cervical spondylosis with myelopathy and radiculopathy 09/15/2019   AKI (acute kidney injury) ([place] 09/14/2019  GERD (gastroesophageal reflux disease) 09/14/2019   Spinal stenosis in cervical region 09/14/2019   Weakness 09/14/2019   Numbness and tingling 09/14/2019   Cervical spinal stenosis 09/14/2019   Status post cataract extraction and insertion of intraocular lens of left eye 09/04/2019   Lumbar stenosis 08/24/2019   Allergic rhinitis due to pollen 06/06/2019   Persistent cough 06/06/2019   History of Descemet membrane endothelial keratoplasty (DMEK) 01/04/2019   Status post cataract extraction and insertion of intraocular lens of right eye 01/04/2019   Senile nuclear sclerosis 01/02/2019   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   Anatomical narrow angle, 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   Obesity, morbid, BMI 40.0-49.9 ([place]) 04/19/2018   Morbid obesity ([place]) 04/19/2018   Cataract    Fibroid    Endometriosis    DM (diabetes mellitus) type II controlled with renal manifestation ([place]) 06/17/2010   Dyslipidemia 06/17/2010   Microcytic anemia 06/17/2010   Essential hypertension 06/17/2010   Allergic rhinitis 06/17/2010   Osteoarthritis 06/17/2010   LOW BACK PAIN 06/17/2010   OSTEOPENIA 06/17/2010   BREAST CANCER, HX OF 06/17/2010   Disorder of skeletal system 06/17/2010    Stark Bray 03/01/2021, 11:56 AM  [place], [place], [address] Phone: [phone]   Fax:  [phone]  Name: Doris Jackson MRN: [id number] Date of Birth: [birth year]

## 2021-03-05 DIAGNOSIS — J3089 Other allergic rhinitis: Secondary | ICD-10-CM | POA: Diagnosis not present

## 2021-03-05 DIAGNOSIS — J301 Allergic rhinitis due to pollen: Secondary | ICD-10-CM | POA: Diagnosis not present

## 2021-03-06 ENCOUNTER — Ambulatory Visit: Payer: Medicare HMO

## 2021-03-06 ENCOUNTER — Other Ambulatory Visit: Payer: Self-pay

## 2021-03-06 DIAGNOSIS — M6281 Muscle weakness (generalized): Secondary | ICD-10-CM | POA: Diagnosis not present

## 2021-03-06 DIAGNOSIS — M545 Low back pain, unspecified: Secondary | ICD-10-CM

## 2021-03-06 DIAGNOSIS — I890 Lymphedema, not elsewhere classified: Secondary | ICD-10-CM

## 2021-03-06 DIAGNOSIS — G8929 Other chronic pain: Secondary | ICD-10-CM | POA: Diagnosis not present

## 2021-03-06 DIAGNOSIS — M25611 Stiffness of right shoulder, not elsewhere classified: Secondary | ICD-10-CM | POA: Diagnosis not present

## 2021-03-06 DIAGNOSIS — R262 Difficulty in walking, not elsewhere classified: Secondary | ICD-10-CM | POA: Diagnosis not present

## 2021-03-06 DIAGNOSIS — Z483 Aftercare following surgery for neoplasm: Secondary | ICD-10-CM | POA: Diagnosis not present

## 2021-03-06 NOTE — Therapy (Signed)
[place], [place], [address] Phone: [phone]   Fax:  [phone]  Physical Therapy Treatment  Patient Details  Name: Doris Jackson MRN: [id number] Date of Birth: 1940/12/23 Referring Provider (PT): Dr. Griffin Basil   Encounter Date: 03/06/2021   PT End of Session - 03/06/21 1155     Visit Number 13    Number of Visits 18    Date for PT Re-Evaluation 03/18/21    PT Start Time 1104    PT Stop Time 4650    PT Time Calculation (min) 50 min    Activity Tolerance Patient tolerated treatment well    Behavior During Therapy [facility] for tasks assessed/performed             Past Medical History:  Diagnosis Date   ALLERGIC RHINITIS 06/17/2010   ANEMIA-NOS 06/17/2010   BREAST CANCER, HX OF 06/17/2010   Breast cancer, right ([place]) 2008   Cataract    COPD 06/17/2010   pt denies   DIABETES MELLITUS, TYPE II 06/17/2010   Endometriosis    Fibroid    FIBROID   GERD (gastroesophageal reflux disease)    HYPERLIPIDEMIA 06/17/2010   HYPERTENSION 06/17/2010   Leg swelling    LOW BACK PAIN 06/17/2010   OSTEOARTHRITIS 06/17/2010   OSTEOPENIA 06/17/2010   Weakness     Past Surgical History:  Procedure Laterality Date   ABDOMINAL HYSTERECTOMY  1988   TAH,LSO   ANTERIOR CERVICAL DECOMP/DISCECTOMY FUSION N/A 09/15/2019   Procedure: Cervical five CorpectomyANTERIOR CERVICAL DECOMPRESSION/DISCECTOMY FUSION CERVICAL THREE-CERVICAL Four;  Surgeon: Ashok Pall, MD;  Location: [place] [facility];  Service: Neurosurgery;  Laterality: N/A;   BREAST LUMPECTOMY Right 2007   LUMPECTOMY FOLLOWED BY RADIATION   COLONOSCOPY     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   EYE SURGERY Bilateral    Cataract   HARDWARE REMOVAL N/A 10/21/2019   Procedure: Anterior cervical plate removal with revision;  Surgeon: Ashok Pall, MD;  Location: [facility];  Service: Neurosurgery;  Laterality: N/A;  3C   Lazer Bilateral     OOPHORECTOMY  1988   TAH,LSO   REVERSE SHOULDER ARTHROPLASTY Right 09/19/2020   Procedure: REVERSE SHOULDER ARTHROPLASTY;  Surgeon: Hiram Gash, MD;  [facility];  Service: Orthopedics;  Laterality: Right;   TUBAL LIGATION      There were no vitals filed for this visit.   Subjective Assessment - 03/06/21 1102     Subjective Wrap stayed on for 5 days, but it is loose now    Pertinent History Pt is s/p right breast lumpectomy with radiation in 2007.  She has had some swelling for a number of years, but has never been treated.  [facility] and fractured her righ shoulder resulting in a Right reverse total Shoulder.  After that she noticed more swelling in the right UE and was referred by Dr. Griffin Basil for lymphedema treatment. She is also diabetic and has hypertension. She has LBP, prior cervical fusion, OA. She is using a rollator    Patient Stated Goals Decrease swelling in arm, gain information from provider    Currently in Pain? No/denies    Multiple Pain Sites No                               [facility] Adult PT Treatment/Exercise - 03/06/21 0001       Manual  Therapy   Manual therapy comments remeasure Friday secondary to wrap loose    Manual Lymphatic Drainage (MLD) In supine with head of bed elevated: short neck, 5 diaphragmatic breaths, left axillary LN's, right inguinal LN's, anterior interaxillary pathway, right axillo inguinal pathway and right UE starting proximally and working distally, retracing all steps.    Compression Bandaging Pt washed arm  after removing bandages Large TG soft turned inside out due to skin irritation at upper arm, and  medium at lower arm.  10 cm soft comprilan foam applied wrist to axilla.  fingers 1-4 wrapped with elastomull, 6 cm to hand and wrist, 10 cm wrist to mid upper arm, and 12 cm  from mid forearm to axilla. 12 cm wrist to axilla. TG soft pulled down over top of bandage.                  Upper  Extremity Functional Index Score :   /80        PT Long Term Goals - 02/27/21 1220       PT LONG TERM GOAL #1   Title Pt will have decreased edema  at 15 cm prox to ulna and olecranon by 2-4 cm    Baseline 1.5 cm decrease 15 cm prox to ulna, 5.6 cm decrease 15 cm prox to olecranon    Time 6    Period Weeks    Status Partially Met      PT LONG TERM GOAL #2   Title Pts husband will be independent with compression bandaging to help reduce right UE swelling.    Time 6    Period Weeks    Status On-going      PT LONG TERM GOAL #3   Title Pt will be fit for appropiate compression garments and will be independent with husbands help in their use    Time 6    Period Weeks    Status On-going      PT LONG TERM GOAL #4   Title Pt will be educated in skin care, and precautions for lymphedema    Status On-going                   Plan - 03/06/21 1157     Clinical Impression Statement pt was able to leaave wrap on for 5 days and it remained intact without sliding but did get loose.  Pts arm was washed and lotion applied and MLD was performed before rewrapping with freshly laundered wraps. Measure next visit . Pt awaiting compression sleeve   Personal Factors and Comorbidities Age;Comorbidity 3+    Comorbidities Right lumpectomy with SLNB and radiation, Recent Reverse right TSA, Cervical Fusion, LBP, OA,hypertension    Examination-Activity Limitations Lift;Reach Overhead;Locomotion Level    Stability/Clinical Decision Making Stable/Uncomplicated    Rehab Potential Good    PT Frequency 3x / week    PT Duration 6 weeks    PT Treatment/Interventions ADLs/Self Care Home Management;Therapeutic exercise;Manual techniques;Orthotic Fit/Training;Patient/family education;Manual lymph drainage;Compression bandaging;Scar mobilization;Passive range of motion    PT Next Visit Plan MLD, compression bandaging, show pt night garment options and see if she fits in and OTS triute velcro     Consulted and Agree with Plan of Care Patient             Patient will benefit from skilled therapeutic intervention in order to improve the following deficits and impairments:  Decreased range of motion, Decreased scar mobility, Decreased strength, Increased edema, Postural dysfunction, Impaired UE  functional use  Visit Diagnosis: Lymphedema, not elsewhere classified  Stiffness of right shoulder, not elsewhere classified  Aftercare following surgery for neoplasm  Muscle weakness (generalized)  Chronic low back pain, unspecified back pain laterality, unspecified whether sciatica present  Difficulty in walking, not elsewhere classified     Problem List Patient Active Problem List   Diagnosis Date Noted   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   Allergic rhinitis due to animal (cat) (dog) hair and dander 07/18/2020   Leiomyoma 07/18/2020   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   Anxiety state    Acute blood loss anemia    Postoperative pain    Cervical myelopathy ([place]) 09/22/2019   S/P cervical spinal fusion 09/22/2019   Cervical spondylosis with myelopathy and radiculopathy 09/15/2019   AKI (acute kidney injury) ([place]) 09/14/2019   GERD (gastroesophageal reflux disease) 09/14/2019   Spinal stenosis in cervical region 09/14/2019   Weakness 09/14/2019   Numbness and tingling 09/14/2019   Cervical spinal stenosis 09/14/2019   Status post cataract extraction and insertion of intraocular lens of left eye 09/04/2019   Lumbar stenosis 08/24/2019   Allergic rhinitis due to pollen 06/06/2019   Persistent cough 06/06/2019   History of Descemet membrane endothelial keratoplasty (DMEK) 01/04/2019   Status post cataract extraction and insertion of intraocular lens of right eye 01/04/2019   Senile nuclear sclerosis 01/02/2019   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   Anatomical narrow angle, 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   Obesity,  morbid, BMI 40.0-49.9 ([place]) 04/19/2018   Morbid obesity ([place]) 04/19/2018   Cataract    Fibroid    Endometriosis    DM (diabetes mellitus) type II controlled with renal manifestation ([place]) 06/17/2010   Dyslipidemia 06/17/2010   Microcytic anemia 06/17/2010   Essential hypertension 06/17/2010   Allergic rhinitis 06/17/2010   Osteoarthritis 06/17/2010   LOW BACK PAIN 06/17/2010   OSTEOPENIA 06/17/2010   BREAST CANCER, HX OF 06/17/2010   Disorder of skeletal system 06/17/2010    Claris Pong, PT 03/06/2021, 12:01 PM  [place], [place], [address] Phone: [phone]   Fax:  [phone]  Name: Doris Jackson MRN: [id number] Date of Birth: 1941-02-03 [name], PT 03/06/21 12:02 PM

## 2021-03-08 ENCOUNTER — Other Ambulatory Visit: Payer: Self-pay

## 2021-03-08 ENCOUNTER — Telehealth: Payer: Self-pay | Admitting: Pharmacist

## 2021-03-08 ENCOUNTER — Ambulatory Visit: Payer: Medicare HMO

## 2021-03-08 DIAGNOSIS — M25611 Stiffness of right shoulder, not elsewhere classified: Secondary | ICD-10-CM

## 2021-03-08 DIAGNOSIS — R262 Difficulty in walking, not elsewhere classified: Secondary | ICD-10-CM | POA: Diagnosis not present

## 2021-03-08 DIAGNOSIS — Z483 Aftercare following surgery for neoplasm: Secondary | ICD-10-CM | POA: Diagnosis not present

## 2021-03-08 DIAGNOSIS — M545 Low back pain, unspecified: Secondary | ICD-10-CM | POA: Diagnosis not present

## 2021-03-08 DIAGNOSIS — I890 Lymphedema, not elsewhere classified: Secondary | ICD-10-CM

## 2021-03-08 DIAGNOSIS — G8929 Other chronic pain: Secondary | ICD-10-CM | POA: Diagnosis not present

## 2021-03-08 DIAGNOSIS — M6281 Muscle weakness (generalized): Secondary | ICD-10-CM

## 2021-03-08 NOTE — Chronic Care Management (AMB) (Signed)
Chronic Care Management Pharmacy Assistant   Name: Doris Jackson  MRN: [id number] DOB: Mar 04, 1941  Reason for Encounter: Disease State/ Diabetes Assessment Call.    Conditions to be addressed/monitored: DMII   Recent office visits:  None.  Recent consult visits:  03/08/21 [name] PT (Physical Therapy) - seen for lymphedema and other issues. No medication changes. PT 3x week for 6 weeks.   Hospital visits:  None in previous 6 months  Medications: Outpatient Encounter Medications as of 03/08/2021  Medication Sig Note   Ascorbic Acid (VITAMIN C) 1000 MG tablet Take 1,000 mg by mouth daily.    benazepril (LOTENSIN) 40 MG tablet Take 1 tablet (40 mg total) by mouth daily.    Blood Glucose Monitoring Suppl (ONETOUCH VERIO REFLECT) w/Device KIT USE UP TO FOUR TIMES DAILY AS DIRECTED.    Cholecalciferol (VITAMIN D) 2000 UNITS CAPS Take 4,000 Units by mouth daily.     cyanocobalamin 2000 MCG tablet Take 2,000 mcg by mouth daily.    famotidine (PEPCID) 40 MG tablet Take 1 tablet (40 mg total) by mouth at bedtime.    fexofenadine (ALLEGRA) 180 MG tablet Take 180 mg by mouth daily as needed for allergies.    fluticasone (FLONASE) 50 MCG/ACT nasal spray Place 1 spray into both nostrils daily.    furosemide (LASIX) 20 MG tablet Take 1 tablet (20 mg total) by mouth daily as needed.    glipiZIDE (GLUCOTROL XL) 5 MG 24 hr tablet TAKE 1 TABLET BY MOUTH EVERY DAY    glucose blood (ONETOUCH VERIO) test strip USE  STRIP TO CHECK GLUCOSE 2-4 times daily as needed for glucose control    hydrochlorothiazide (HYDRODIURIL) 12.5 MG tablet Take 1 tablet by mouth once daily    iron polysaccharides (FERREX 150) 150 MG capsule Take 1 capsule (150 mg total) by mouth daily.    Lancets 30G MISC USE TWICE DAILY AS DIRECTED FOR GLUCOSE TESTING AND MONITORING    metFORMIN (GLUCOPHAGE XR) 500 MG 24 hr tablet Take 1 tablet (500 mg total) by mouth 2 (two) times daily.    metoprolol succinate (TOPROL-XL) 50 MG 24  hr tablet TAKE 1 TABLET BY MOUTH ONCE DAILY .  TAKE  WITH  OR  IMMEDIATELY  FOLLOWING  A  MEAL ** PATIENT NEEDS AN APPOINTMENT**    ONE TOUCH LANCETS MISC Test twice daily.    pioglitazone (ACTOS) 45 MG tablet TAKE 1 TABLET BY MOUTH EVERY DAY    pravastatin (PRAVACHOL) 40 MG tablet Take 1 tablet (40 mg total) by mouth daily.    prednisoLONE acetate (PRED FORTE) 1 % ophthalmic suspension Place 1 drop into both eyes See admin instructions. Place 1 drop into left eye three times daily Place 1 drop into right eye once daily 09/19/2020: LF : 08/21/20 30 day supply   Probiotic Product (PROBIOTIC-10 PO) Take 1 capsule by mouth daily.    senna-docusate (SENOKOT-S) 8.6-50 MG tablet Take 1 tablet by mouth at bedtime.    No facility-administered encounter medications on file as of 03/08/2021.   Fill History: BENAZEPRIL 40MG     TAB 01/23/2021 90   FAMOTIDINE 40MG TAB 02/04/2021 90   FUROSEMIDE 20MG     TAB 01/25/2021 90   ONETOUCH VERIO TES 01/31/2021 50   METOPROLOL ER 50MG  TAB 02/25/2021 90   OMEPRAZOLE DR 40MG CAP 09/19/2020 90   PIOGLITAZONE 45MG TAB 01/08/2021 90   FERREX 150 150MG    CAP 11/08/2020 90   POTASSIUM CL ER 10  MEQ CAPSULE 08/09/2020 90   PRAVASTATIN 40MG    TAB 01/05/2021 90   SPIRONOLACTONE 25MG TAB 01/18/2021 90   GLIPIZIDE ER 5MG    TAB 01/22/2021 90   HYDROCHLOROTHIAZIDE 12.5MG TAB 01/28/2021 90   METFORMIN ER 500MG  TAB 11/09/2020 90   PREDNISOLONE ACETATE 1% SUS 12/06/2020 50   Recent Relevant Labs: Lab Results  Component Value Date/Time   HGBA1C 6.6 (H) 09/19/2020 08:08 AM   HGBA1C 6.2 (H) 03/21/2020 11:45 AM   MICROALBUR 0.7 03/21/2020 11:45 AM   MICROALBUR 1.3 07/02/2017 01:38 PM    Kidney Function Lab Results  Component Value Date/Time   CREATININE 1.24 (H) 09/24/2020 05:24 PM   CREATININE 1.34 (H) 09/19/2020 08:08 AM   CREATININE 1.50 (H) 04/03/2020 09:13 AM   CREATININE 1.43 (H) 03/21/2020 11:45 AM   CREATININE 0.8 04/20/2012 11:11 AM   GFR  64.16 12/19/2019 01:04 PM   GFRNONAA 45 (L) 09/24/2020 05:24 PM   GFRAA >60 10/21/2019 12:50 PM    Current antihyperglycemic regimen:  Glipizide 62m - take 1 tablet by mouth everyday. Metformin 50110m- take 1 tablet by mouth twice daily.  Pioglitazone 4554m take 1 tablet by mouth everyday. What recent interventions/DTPs have been made to improve glycemic control:  None. Have there been any recent hospitalizations or ED visits since last visit with CPP? No Patient denies hypoglycemic symptoms, including None Patient denies hyperglycemic symptoms, including none How often are you checking your blood sugar? twice daily What are your blood sugars ranging?  Fasting: 128 this am. Bedtime: around 150s at night.  During the week, how often does your blood glucose drop below 70? Never Are you checking your feet daily/regularly? Yes. Patient checks feet daily.   Adherence Review: Is the patient currently on a STATIN medication? Yes Is the patient currently on ACE/ARB medication? No Does the patient have >5 day gap between last estimated fill dates? No  Notes: Spoke with patient and reviewed all medications as prescribed and patient stated that she no longer takes the vitamin C and her Flonase and allegra are only as needed. Patients eye drops were changed to only 3 days a week. Patient reports no issues with her medications at this time. Patient stated she eats red meat a couple times a week and she does not really add salt to her food. Patient drinks at least 3 glasses of water a day due to her kidneys. Patient does get a sweet craving here and there and she will have something but tries to avoid icing. Patient goes to physical therapy 3 times a week currently and said that is her activity right now. Patient rescheduled her appointment with MadJeni Sallese clinical pharmacist. Patient thanked me for my call.   Care Gaps:  AWV -  scheduled for 11/19/21 Tetanus/ TDAP - never done Zoster  vaccines - never done PAP smear -  overdue since 09/21/13 Foot exam -  overdue since 04/20/19 Covid-19 vaccine booster 4- overdue since 07/19/20 Influenza vaccine -  overdue since 01/28/21   Star Rating Drugs:  Benazepril 4m66mlast filled on 01/23/21 90DS at [facility] Glipizide 5mg 16mast filled on 01/22/21 90DS at [facility] Metformin 500mg 79mst filled on 11/09/20 90DS at [facility] Pioglitazone 45mg -17mt filled on 01/08/21 90DS at [facility] Pravastatin 4mg - 71m filled on 01/05/21 90DS at [facility] [phone]

## 2021-03-08 NOTE — Therapy (Addendum)
[place], [place], [address] Phone: [phone]   Fax:  [phone]  Physical Therapy Treatment  Patient Details  Name: Doris Jackson MRN: [id number] Date of Birth: 1941/05/26 Referring Provider (PT): Dr. Griffin Basil   Encounter Date: 03/08/2021   PT End of Session - 03/08/21 1210     Visit Number 14    Number of Visits 18    Date for PT Re-Evaluation 03/18/21    PT Start Time 1106    PT Stop Time 1200    PT Time Calculation (min) 54 min    Activity Tolerance Patient tolerated treatment well    Behavior During Therapy [facility] for tasks assessed/performed             Past Medical History:  Diagnosis Date   ALLERGIC RHINITIS 06/17/2010   ANEMIA-NOS 06/17/2010   BREAST CANCER, HX OF 06/17/2010   Breast cancer, right ([place]) 2008   Cataract    COPD 06/17/2010   pt denies   DIABETES MELLITUS, TYPE II 06/17/2010   Endometriosis    Fibroid    FIBROID   GERD (gastroesophageal reflux disease)    HYPERLIPIDEMIA 06/17/2010   HYPERTENSION 06/17/2010   Leg swelling    LOW BACK PAIN 06/17/2010   OSTEOARTHRITIS 06/17/2010   OSTEOPENIA 06/17/2010   Weakness     Past Surgical History:  Procedure Laterality Date   ABDOMINAL HYSTERECTOMY  1988   TAH,LSO   ANTERIOR CERVICAL DECOMP/DISCECTOMY FUSION N/A 09/15/2019   Procedure: Cervical five CorpectomyANTERIOR CERVICAL DECOMPRESSION/DISCECTOMY FUSION CERVICAL THREE-CERVICAL Four;  Surgeon: Ashok Pall, MD;  Location: [place];  Service: Neurosurgery;  Laterality: N/A;   BREAST LUMPECTOMY Right 2007   LUMPECTOMY FOLLOWED BY RADIATION   COLONOSCOPY     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   Subjective Assessment - 03/08/21 1115     Subjective The wrap stayed up real good    Pertinent History Pt is s/p right breast lumpectomy with radiation in 2007.  She has had some swelling for a number of years, but has never been treated.  She suffered a fall at [facility] in March and fractured her righ shoulder resulting in a Right reverse total Shoulder.  After that she noticed more swelling in the right UE and was referred by Dr. Griffin Basil for lymphedema treatment. She is also diabetic and has hypertension. She has LBP, prior cervical fusion, OA. She is using a rollator    Patient Stated Goals Decrease swelling in arm, gain information from provider    Currently in Pain? No/denies                   LYMPHEDEMA/ONCOLOGY QUESTIONNAIRE - 03/08/21 0001       Right Upper Extremity Lymphedema   15 cm Proximal to Olecranon Process 41.5 cm    10 cm Proximal to Olecranon Process 45.3 cm  Olecranon Process 29.4 cm    15 cm Proximal to Ulnar Styloid Process 28.1 cm    10 cm Proximal to Ulnar Styloid Process 26.2 cm    Just Proximal to Ulnar Styloid Process 19.9 cm    Across Hand at [employer] 19.5 cm    At [place] of 2nd Digit 6.3 cm                        [facility] Adult PT Treatment/Exercise - 03/08/21 0001       Manual Therapy   Manual therapy comments remeasured    Manual Lymphatic Drainage (MLD) In supine with head of bed elevated: short neck, 5 diaphragmatic breaths, left axillary LN's, right inguinal LN's, anterior interaxillary pathway, right axillo inguinal pathway and right UE starting proximally and working distally, retracing all steps.    Compression Bandaging Pt washed arm  after  removing bandages Large TG soft turned inside out due to skin irritation at upper arm, and  medium at lower arm.  10 cm soft comprilan foam applied wrist to axilla.  fingers 1-4 wrapped with elastomull, 6 cm to hand and wrist, 10 cm wrist to mid upper arm, and 12 cm  from mid forearm to axilla. 12 cm wrist to axilla. TG soft pulled down over top of bandage.                          PT Long Term Goals - 02/27/21 1220       PT LONG TERM GOAL #1   Title Pt will have decreased edema  at 15 cm prox to ulna and olecranon by 2-4 cm    Baseline 1.5 cm decrease 15 cm prox to ulna, 5.6 cm decrease 15 cm prox to olecranon    Time 6    Period Weeks    Status Partially Met      PT LONG TERM GOAL #2   Title Pts husband will be independent with compression bandaging to help reduce right UE swelling.    Time 6    Period Weeks    Status On-going      PT LONG TERM GOAL #3   Title Pt will be fit for appropiate compression garments and will be independent with husbands help in their use    Time 6    Period Weeks    Status On-going      PT LONG TERM GOAL #4   Title Pt will be educated in skin care, and precautions for lymphedema    Status On-going                   Plan - 03/08/21 1211     Clinical Impression Statement Pts. wrap remained intact and with good pressure, however  measurements are slightly up throughout.  Continued  MLD and bandaging. Pt is awaiting sleeve.  Pt will benefit from Flexitouch sequential compression pump as she has significant lymphedema from breast surgery and a recent serious injury to the right shoulder requiring a Total shoulder replacement.  Despite compliance with all aspects of treatment her arm remains very swollen and with hyperkeratosis . She also has some mild trunk swelling. She failed the Flexitouch basic pump secondary to pain , but would benefit from the advanced pump. Having the flexitouch will help her continue to reduce her arm and  trunk and will help prevent infections thereby decreasing further potential medical costs.   Personal  Factors and Comorbidities Age;Comorbidity 3+    Comorbidities Right lumpectomy with SLNB and radiation, Recent Reverse right TSA, Cervical Fusion, LBP, OA,hypertension    Examination-Activity Limitations Lift;Reach Overhead;Locomotion Level    Examination-Participation Restrictions Cleaning    Stability/Clinical Decision Making Stable/Uncomplicated    Rehab Potential Good    PT Frequency 3x / week    PT Duration 6 weeks    PT Treatment/Interventions ADLs/Self Care Home Management;Therapeutic exercise;Manual techniques;Orthotic Fit/Training;Patient/family education;Manual lymph drainage;Compression bandaging;Scar mobilization;Passive range of motion    PT Next Visit Plan MLD, compression bandaging, show pt night garment options and see if she fits in and OTS triute velcro    Consulted and Agree with Plan of Care Patient             Patient will benefit from skilled therapeutic intervention in order to improve the following deficits and impairments:  Decreased range of motion, Decreased scar mobility, Decreased strength, Increased edema, Postural dysfunction, Impaired UE functional use  Visit Diagnosis: Lymphedema, not elsewhere classified  Stiffness of right shoulder, not elsewhere classified  Aftercare following surgery for neoplasm  Muscle weakness (generalized)     Problem List Patient Active Problem List   Diagnosis Date Noted   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   Allergic rhinitis due to animal (cat) (dog) hair and dander 07/18/2020   Leiomyoma 07/18/2020   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   Anxiety state    Acute blood loss anemia    Postoperative pain    Cervical myelopathy ([place]) 09/22/2019   S/P cervical spinal fusion 09/22/2019   Cervical spondylosis with myelopathy and radiculopathy 09/15/2019   AKI  (acute kidney injury) ([place]) 09/14/2019   GERD (gastroesophageal reflux disease) 09/14/2019   Spinal stenosis in cervical region 09/14/2019   Weakness 09/14/2019   Numbness and tingling 09/14/2019   Cervical spinal stenosis 09/14/2019   Status post cataract extraction and insertion of intraocular lens of left eye 09/04/2019   Lumbar stenosis 08/24/2019   Allergic rhinitis due to pollen 06/06/2019   Persistent cough 06/06/2019   History of Descemet membrane endothelial keratoplasty (DMEK) 01/04/2019   Status post cataract extraction and insertion of intraocular lens of right eye 01/04/2019   Senile nuclear sclerosis 01/02/2019   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   Anatomical narrow angle, 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   Obesity, morbid, BMI 40.0-49.9 ([place]) 04/19/2018   Morbid obesity ([place]) 04/19/2018   Cataract    Fibroid    Endometriosis    DM (diabetes mellitus) type II controlled with renal manifestation ([place]) 06/17/2010   Dyslipidemia 06/17/2010   Microcytic anemia 06/17/2010   Essential hypertension 06/17/2010   Allergic rhinitis 06/17/2010   Osteoarthritis 06/17/2010   LOW BACK PAIN 06/17/2010   OSTEOPENIA 06/17/2010   BREAST CANCER, HX OF 06/17/2010   Disorder of skeletal system 06/17/2010    Claris Pong, PT 03/08/2021, 12:26 PM  [place], [place], [address] Phone: [phone]   Fax:  [phone]  Name: Doris Jackson MRN: [id number] Date of Birth: [birth year]

## 2021-03-11 ENCOUNTER — Other Ambulatory Visit: Payer: Self-pay

## 2021-03-11 ENCOUNTER — Ambulatory Visit: Payer: Medicare HMO

## 2021-03-11 DIAGNOSIS — G8929 Other chronic pain: Secondary | ICD-10-CM | POA: Diagnosis not present

## 2021-03-11 DIAGNOSIS — M6281 Muscle weakness (generalized): Secondary | ICD-10-CM

## 2021-03-11 DIAGNOSIS — Z483 Aftercare following surgery for neoplasm: Secondary | ICD-10-CM | POA: Diagnosis not present

## 2021-03-11 DIAGNOSIS — M545 Low back pain, unspecified: Secondary | ICD-10-CM | POA: Diagnosis not present

## 2021-03-11 DIAGNOSIS — M25611 Stiffness of right shoulder, not elsewhere classified: Secondary | ICD-10-CM

## 2021-03-11 DIAGNOSIS — I890 Lymphedema, not elsewhere classified: Secondary | ICD-10-CM

## 2021-03-11 DIAGNOSIS — R262 Difficulty in walking, not elsewhere classified: Secondary | ICD-10-CM | POA: Diagnosis not present

## 2021-03-11 NOTE — Therapy (Signed)
[place], [place], [address] Phone: [phone]   Fax:  [phone]  Physical Therapy Treatment  Patient Details  Name: Doris Jackson MRN: [id number] Date of Birth: 08-17-1940 Referring Provider (PT): Dr. Griffin Basil   Encounter Date: 03/11/2021   PT End of Session - 03/11/21 1201     Visit Number 15    Number of Visits 18    Date for PT Re-Evaluation 03/18/21    PT Start Time 1109    PT Stop Time 1200    PT Time Calculation (min) 51 min    Activity Tolerance Patient tolerated treatment well    Behavior During Therapy [facility] for tasks assessed/performed             Past Medical History:  Diagnosis Date   ALLERGIC RHINITIS 06/17/2010   ANEMIA-NOS 06/17/2010   BREAST CANCER, HX OF 06/17/2010   Breast cancer, right ([place]) 2008   Cataract    COPD 06/17/2010   pt denies   DIABETES MELLITUS, TYPE II 06/17/2010   Endometriosis    Fibroid    FIBROID   GERD (gastroesophageal reflux disease)    HYPERLIPIDEMIA 06/17/2010   HYPERTENSION 06/17/2010   Leg swelling    LOW BACK PAIN 06/17/2010   OSTEOARTHRITIS 06/17/2010   OSTEOPENIA 06/17/2010   Weakness     Past Surgical History:  Procedure Laterality Date   ABDOMINAL HYSTERECTOMY  1988   TAH,LSO   ANTERIOR CERVICAL DECOMP/DISCECTOMY FUSION N/A 09/15/2019   Procedure: Cervical five CorpectomyANTERIOR CERVICAL DECOMPRESSION/DISCECTOMY FUSION CERVICAL THREE-CERVICAL Four;  Surgeon: Ashok Pall, MD;  Location: [place];  Service: Neurosurgery;  Laterality: N/A;   BREAST LUMPECTOMY Right 2007   LUMPECTOMY FOLLOWED BY RADIATION   COLONOSCOPY     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   Subjective Assessment - 03/11/21 1121     Subjective The wrap still stays up well.  I rested most of the weekend. Have not had a call back yet about shoulder surgery payments to send to flexi touch   Pertinent History Pt is s/p right breast lumpectomy with radiation in 2007.  She has had some swelling for a number of years, but has never been treated.  She suffered a [facility] and fractured her righ shoulder resulting in a Right reverse total Shoulder.  After that she noticed more swelling in the right UE and was referred by Dr. Griffin Basil for lymphedema treatment. She is also diabetic and has hypertension. She has LBP, prior cervical fusion, OA. She is using a rollator    Patient Stated Goals Decrease swelling in arm, gain information from provider    Currently in Pain? No/denies                               [facility] Adult PT Treatment/Exercise -  03/11/21 0001       Manual Therapy   Manual Lymphatic Drainage (MLD) In supine with head of bed elevated: short neck, 5 diaphragmatic breaths, left axillary LN's, right inguinal LN's, anterior interaxillary pathway, right axillo inguinal pathway and right UE starting proximally and working distally, retracing all steps.    Compression Bandaging Pt washed arm  after removing bandages Large TG soft turned inside out due to skin irritation at upper arm, and  medium at lower arm.  10 cm soft comprilan foam applied wrist to axilla.  fingers 1-4 wrapped with elastomull, 6 cm to hand and wrist, 10 cm wrist to mid upper arm, and 12 cm  from mid forearm to axilla. 12 cm wrist to axilla. TG soft pulled down over top of bandage.                           PT Long Term Goals - 02/27/21 1220       PT LONG TERM GOAL #1   Title Pt will have decreased edema  at 15 cm prox to ulna and olecranon by 2-4 cm    Baseline 1.5 cm decrease 15 cm prox to ulna, 5.6 cm decrease 15 cm prox to olecranon    Time 6    Period Weeks    Status Partially Met      PT LONG TERM GOAL #2   Title Pts husband will be independent with compression bandaging to help reduce right UE swelling.    Time 6    Period Weeks    Status On-going      PT LONG TERM GOAL #3   Title Pt will be fit for appropiate compression garments and will be independent with husbands help in their use    Time 6    Period Weeks    Status On-going      PT LONG TERM GOAL #4   Title Pt will be educated in skin care, and precautions for lymphedema    Status On-going                   Plan - 03/11/21 1202     Clinical Impression Statement Pts wraps removed and arm washed and lotion applied.  Continued manual lymph drainage and compression bandaging to the right UE.  We discussed Tribute night and I believe she will fit into ready made however, she is not sure she can afford it.  We are also awaiting flexi touch.    Personal Factors and Comorbidities Age;Comorbidity 3+    Comorbidities Right lumpectomy with SLNB and radiation, Recent Reverse right TSA, Cervical Fusion, LBP, OA,hypertension    Examination-Activity Limitations Lift;Reach Overhead;Locomotion Level    Examination-Participation Restrictions Cleaning    Stability/Clinical Decision Making Stable/Uncomplicated    Rehab Potential Good    PT Frequency 3x / week    PT Duration 6 weeks    PT Treatment/Interventions ADLs/Self Care Home Management;Therapeutic exercise;Manual techniques;Orthotic Fit/Training;Patient/family education;Manual lymph drainage;Compression bandaging;Scar mobilization;Passive range of motion    PT Next Visit Plan MLD, compression bandaging, show pt night garment options     Consulted and Agree with Plan of Care Patient             Patient will benefit from skilled therapeutic intervention in order to improve the following deficits and impairments:  Decreased range of motion, Decreased scar mobility, Decreased strength, Increased edema, Postural dysfunction, Impaired UE functional use  Visit Diagnosis: Lymphedema, not elsewhere classified  Stiffness of right shoulder, not elsewhere classified  Aftercare following surgery for neoplasm  Muscle weakness (generalized)     Problem List Patient Active Problem List   Diagnosis Date Noted   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   Allergic rhinitis due to animal (cat) (dog) hair and dander 07/18/2020   Leiomyoma 07/18/2020   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   Anxiety state    Acute blood loss anemia    Postoperative pain    Cervical myelopathy ([place]) 09/22/2019   S/P cervical spinal fusion 09/22/2019   Cervical spondylosis with myelopathy and radiculopathy 09/15/2019   AKI (acute kidney injury) ([place]) 09/14/2019   GERD (gastroesophageal reflux disease) 09/14/2019   Spinal stenosis in cervical region 09/14/2019   Weakness 09/14/2019   Numbness and tingling 09/14/2019   Cervical spinal stenosis 09/14/2019   Status post cataract extraction and insertion of intraocular lens of left eye 09/04/2019   Lumbar stenosis 08/24/2019   Allergic rhinitis due to pollen 06/06/2019   Persistent cough 06/06/2019   History of Descemet membrane endothelial keratoplasty (DMEK) 01/04/2019   Status post cataract extraction and insertion of intraocular lens of right eye 01/04/2019   Senile nuclear sclerosis 01/02/2019   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   Anatomical narrow angle, 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   Obesity, morbid, BMI 40.0-49.9 ([place]) 04/19/2018   Morbid obesity ([place]) 04/19/2018   Cataract    Fibroid    Endometriosis    DM (diabetes mellitus) type II  controlled with renal manifestation ([place]) 06/17/2010   Dyslipidemia 06/17/2010   Microcytic anemia 06/17/2010   Essential hypertension 06/17/2010   Allergic rhinitis 06/17/2010   Osteoarthritis 06/17/2010   LOW BACK PAIN 06/17/2010   OSTEOPENIA 06/17/2010   BREAST CANCER, HX OF 06/17/2010   Disorder of skeletal system 06/17/2010    Claris Pong, PT 03/11/2021, 12:05 PM  [place], [place], [address] Phone: [phone]   Fax:  [phone]  Name: LYLIE BLACKLOCK MRN: [id number] Date of Birth: Jan 30, 1941 [name], PT 03/11/21 12:07 PM

## 2021-03-13 ENCOUNTER — Ambulatory Visit: Payer: Medicare HMO | Admitting: Rehabilitation

## 2021-03-13 ENCOUNTER — Encounter: Payer: Self-pay | Admitting: Rehabilitation

## 2021-03-13 ENCOUNTER — Other Ambulatory Visit: Payer: Self-pay

## 2021-03-13 ENCOUNTER — Encounter: Payer: Self-pay | Admitting: *Deleted

## 2021-03-13 DIAGNOSIS — M6281 Muscle weakness (generalized): Secondary | ICD-10-CM

## 2021-03-13 DIAGNOSIS — R262 Difficulty in walking, not elsewhere classified: Secondary | ICD-10-CM | POA: Diagnosis not present

## 2021-03-13 DIAGNOSIS — M25611 Stiffness of right shoulder, not elsewhere classified: Secondary | ICD-10-CM | POA: Diagnosis not present

## 2021-03-13 DIAGNOSIS — G8929 Other chronic pain: Secondary | ICD-10-CM | POA: Diagnosis not present

## 2021-03-13 DIAGNOSIS — M545 Low back pain, unspecified: Secondary | ICD-10-CM | POA: Diagnosis not present

## 2021-03-13 DIAGNOSIS — I890 Lymphedema, not elsewhere classified: Secondary | ICD-10-CM

## 2021-03-13 DIAGNOSIS — Z483 Aftercare following surgery for neoplasm: Secondary | ICD-10-CM

## 2021-03-13 NOTE — Progress Notes (Signed)
Reviewed chart for [facility] lymphedema funds approval for [facility] request.

## 2021-03-13 NOTE — Therapy (Signed)
[place], [place], [address] Phone: [phone]   Fax:  [phone]  Physical Therapy Treatment  Patient Details  Name: Doris Jackson MRN: [id number] Date of Birth: 10/20/1940 Referring Provider (PT): Dr. Griffin Basil   Encounter Date: 03/13/2021   PT End of Session - 03/13/21 1325     Visit Number 16    Number of Visits 18    Date for PT Re-Evaluation 03/18/21    PT Start Time 1100    PT Stop Time 4128    PT Time Calculation (min) 53 min    Activity Tolerance Patient tolerated treatment well    Behavior During Therapy [facility] for tasks assessed/performed             Past Medical History:  Diagnosis Date   ALLERGIC RHINITIS 06/17/2010   ANEMIA-NOS 06/17/2010   BREAST CANCER, HX OF 06/17/2010   Breast cancer, right ([place]) 2008   Cataract    COPD 06/17/2010   pt denies   DIABETES MELLITUS, TYPE II 06/17/2010   Endometriosis    Fibroid    FIBROID   GERD (gastroesophageal reflux disease)    HYPERLIPIDEMIA 06/17/2010   HYPERTENSION 06/17/2010   Leg swelling    LOW BACK PAIN 06/17/2010   OSTEOARTHRITIS 06/17/2010   OSTEOPENIA 06/17/2010   Weakness     Past Surgical History:  Procedure Laterality Date   ABDOMINAL HYSTERECTOMY  1988   TAH,LSO   ANTERIOR CERVICAL DECOMP/DISCECTOMY FUSION N/A 09/15/2019   Procedure: Cervical five CorpectomyANTERIOR CERVICAL DECOMPRESSION/DISCECTOMY FUSION CERVICAL THREE-CERVICAL Four;  Surgeon: Ashok Pall, MD;  Location: [place];  Service: Neurosurgery;  Laterality: N/A;   BREAST LUMPECTOMY Right 2007   LUMPECTOMY FOLLOWED BY RADIATION   COLONOSCOPY     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   Subjective Assessment - 03/13/21 1056     Subjective I hope my sleeve comes soon I want to stop this bandaging    Pertinent History Pt is s/p right breast lumpectomy with radiation in 2007.  She has had some swelling for a number of years, but has never been treated.  She suffered a fall at [facility] in March and fractured her righ shoulder resulting in a Right reverse total Shoulder.  After that she noticed more swelling in the right UE and was referred by Dr. Griffin Basil for lymphedema treatment. She is also diabetic and has hypertension. She has LBP, prior cervical fusion, OA. She is using a rollator    Currently in Pain? No/denies                               [facility] Adult PT Treatment/Exercise - 03/13/21 0001       Manual Therapy   Manual Lymphatic Drainage (MLD) In supine with head of bed elevated: short neck, 5 diaphragmatic breaths, left  axillary LN's, right inguinal LN's, anterior interaxillary pathway, right axillo inguinal pathway and right UE starting proximally and working distally, retracing all steps.    Compression Bandaging Pt washed arm  after removing bandages Large TG soft turned inside out due to skin irritation at upper arm, and  medium at lower arm.  10 cm soft comprilan foam applied wrist to axilla.  fingers 1-4 wrapped with elastomull, 6 cm to hand and wrist, 10 cm wrist to mid upper arm, and 12 cm  from mid forearm to axilla. 12 cm wrist to axilla. TG soft pulled down over top of bandage.                          PT Long Term Goals - 02/27/21 1220       PT LONG TERM GOAL #1   Title Pt will have decreased edema  at 15 cm prox to ulna and olecranon by 2-4 cm     Baseline 1.5 cm decrease 15 cm prox to ulna, 5.6 cm decrease 15 cm prox to olecranon    Time 6    Period Weeks    Status Partially Met      PT LONG TERM GOAL #2   Title Pts husband will be independent with compression bandaging to help reduce right UE swelling.    Time 6    Period Weeks    Status On-going      PT LONG TERM GOAL #3   Title Pt will be fit for appropiate compression garments and will be independent with husbands help in their use    Time 6    Period Weeks    Status On-going      PT LONG TERM GOAL #4   Title Pt will be educated in skin care, and precautions for lymphedema    Status On-going                   Plan - 03/13/21 1326     Clinical Impression Statement Continued CDT with MLD and bandaging for the Rt UE.  Pt brought some supporting documents for the flexitouch which will be sent over.    PT Frequency 3x / week    PT Duration 6 weeks    PT Treatment/Interventions ADLs/Self Care Home Management;Therapeutic exercise;Manual techniques;Orthotic Fit/Training;Patient/family education;Manual lymph drainage;Compression bandaging;Scar mobilization;Passive range of motion    PT Next Visit Plan MLD, compression bandaging, show pt night garment options    Consulted and Agree with Plan of Care Patient             Patient will benefit from skilled therapeutic intervention in order to improve the following deficits and impairments:     Visit Diagnosis: Lymphedema, not elsewhere classified  Stiffness of right shoulder, not elsewhere classified  Muscle weakness (generalized)  Aftercare following surgery for neoplasm     Problem List Patient Active Problem List   Diagnosis Date Noted   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   Allergic rhinitis due to animal (cat) (dog) hair and dander 07/18/2020   Leiomyoma 07/18/2020   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   Anxiety state    Acute blood loss  anemia    Postoperative pain    Cervical myelopathy ([place]) 09/22/2019   S/P cervical spinal fusion 09/22/2019   Cervical spondylosis with myelopathy and radiculopathy 09/15/2019   AKI (acute kidney injury) ([place]) 09/14/2019   GERD (gastroesophageal reflux disease)  09/14/2019   Spinal stenosis in cervical region 09/14/2019   Weakness 09/14/2019   Numbness and tingling 09/14/2019   Cervical spinal stenosis 09/14/2019   Status post cataract extraction and insertion of intraocular lens of left eye 09/04/2019   Lumbar stenosis 08/24/2019   Allergic rhinitis due to pollen 06/06/2019   Persistent cough 06/06/2019   History of Descemet membrane endothelial keratoplasty (DMEK) 01/04/2019   Status post cataract extraction and insertion of intraocular lens of right eye 01/04/2019   Senile nuclear sclerosis 01/02/2019   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   Anatomical narrow angle, 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   Obesity, morbid, BMI 40.0-49.9 ([place]) 04/19/2018   Morbid obesity ([place]) 04/19/2018   Cataract    Fibroid    Endometriosis    DM (diabetes mellitus) type II controlled with renal manifestation ([place]) 06/17/2010   Dyslipidemia 06/17/2010   Microcytic anemia 06/17/2010   Essential hypertension 06/17/2010   Allergic rhinitis 06/17/2010   Osteoarthritis 06/17/2010   LOW BACK PAIN 06/17/2010   OSTEOPENIA 06/17/2010   BREAST CANCER, HX OF 06/17/2010   Disorder of skeletal system 06/17/2010    Stark Bray, PT 03/13/2021, 1:27 PM  [place], [place], [address] Phone: [phone]   Fax:  [phone]  Name: Doris Jackson MRN: [id number] Date of Birth: 1941-05-04

## 2021-03-15 ENCOUNTER — Other Ambulatory Visit: Payer: Self-pay

## 2021-03-15 ENCOUNTER — Ambulatory Visit: Payer: Medicare HMO

## 2021-03-15 DIAGNOSIS — M545 Low back pain, unspecified: Secondary | ICD-10-CM | POA: Diagnosis not present

## 2021-03-15 DIAGNOSIS — M6281 Muscle weakness (generalized): Secondary | ICD-10-CM | POA: Diagnosis not present

## 2021-03-15 DIAGNOSIS — Z483 Aftercare following surgery for neoplasm: Secondary | ICD-10-CM | POA: Diagnosis not present

## 2021-03-15 DIAGNOSIS — I890 Lymphedema, not elsewhere classified: Secondary | ICD-10-CM

## 2021-03-15 DIAGNOSIS — G8929 Other chronic pain: Secondary | ICD-10-CM | POA: Diagnosis not present

## 2021-03-15 DIAGNOSIS — M25611 Stiffness of right shoulder, not elsewhere classified: Secondary | ICD-10-CM

## 2021-03-15 DIAGNOSIS — R262 Difficulty in walking, not elsewhere classified: Secondary | ICD-10-CM | POA: Diagnosis not present

## 2021-03-15 NOTE — Therapy (Signed)
[place], [place], [address] Phone: [phone]   Fax:  [phone]  Physical Therapy Treatment  Patient Details  Name: Doris Jackson MRN: [id number] Date of Birth: Dec 21, 1940 Referring Provider (PT): Dr. Griffin Basil   Encounter Date: 03/15/2021   PT End of Session - 03/15/21 1203     Visit Number 17    Number of Visits 18    Date for PT Re-Evaluation 03/18/21    PT Start Time 18    PT Stop Time 8099    PT Time Calculation (min) 55 min    Activity Tolerance Patient tolerated treatment well    Behavior During Therapy [facility] for tasks assessed/performed             Past Medical History:  Diagnosis Date   ALLERGIC RHINITIS 06/17/2010   ANEMIA-NOS 06/17/2010   BREAST CANCER, HX OF 06/17/2010   Breast cancer, right ([place]) 2008   Cataract    COPD 06/17/2010   pt denies   DIABETES MELLITUS, TYPE II 06/17/2010   Endometriosis    Fibroid    FIBROID   GERD (gastroesophageal reflux disease)    HYPERLIPIDEMIA 06/17/2010   HYPERTENSION 06/17/2010   Leg swelling    LOW BACK PAIN 06/17/2010   OSTEOARTHRITIS 06/17/2010   OSTEOPENIA 06/17/2010   Weakness     Past Surgical History:  Procedure Laterality Date   ABDOMINAL HYSTERECTOMY  1988   TAH,LSO   ANTERIOR CERVICAL DECOMP/DISCECTOMY FUSION N/A 09/15/2019   Procedure: Cervical five CorpectomyANTERIOR CERVICAL DECOMPRESSION/DISCECTOMY FUSION CERVICAL THREE-CERVICAL Four;  Surgeon: Ashok Pall, MD;  Location: [place];  Service: Neurosurgery;  Laterality: N/A;   BREAST LUMPECTOMY Right 2007   LUMPECTOMY FOLLOWED BY RADIATION   COLONOSCOPY     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   Subjective Assessment - 03/15/21 1104     Subjective I took my wrap off this morning so I could take a shower.  The wrap did well.    Pertinent History Pt is s/p right breast lumpectomy with radiation in 2007.  She has had some swelling for a number of years, but has never been treated.  She suffered a [facility] and fractured her righ shoulder resulting in a Right reverse total Shoulder.  After that she noticed more swelling in the right UE and was referred by Dr. Griffin Basil for lymphedema treatment. She is also diabetic and has hypertension. She has LBP, prior cervical fusion, OA. She is using a rollator    Patient Stated Goals Decrease swelling in arm, gain information from provider    Currently in Pain? No/denies    Multiple Pain Sites No                               [facility] Adult PT Treatment/Exercise - 03/15/21 0001  Manual Therapy   Manual Lymphatic Drainage (MLD) In supine with head of bed elevated: short neck, 5 diaphragmatic breaths, left axillary LN's, right inguinal LN's, anterior interaxillary pathway, right axillo inguinal pathway and right UE starting proximally and working distally, retracing all steps.    Compression Bandaging Large TG soft turned inside out due to skin irritation at upper arm, and  medium at lower arm.  10 cm soft comprilan foam applied wrist to axilla.  fingers 1-4 wrapped with elastomull, 6 cm to hand and wrist, 10 cm wrist to mid upper arm, and 12 cm  from mid forearm to axilla. 12 cm wrist to axilla. TG soft pulled down over top of bandage.                          PT Long Term Goals - 02/27/21 1220       PT LONG TERM GOAL #1    Title Pt will have decreased edema  at 15 cm prox to ulna and olecranon by 2-4 cm    Baseline 1.5 cm decrease 15 cm prox to ulna, 5.6 cm decrease 15 cm prox to olecranon    Time 6    Period Weeks    Status Partially Met      PT LONG TERM GOAL #2   Title Pts husband will be independent with compression bandaging to help reduce right UE swelling.    Time 6    Period Weeks    Status On-going      PT LONG TERM GOAL #3   Title Pt will be fit for appropiate compression garments and will be independent with husbands help in their use    Time 6    Period Weeks    Status On-going      PT LONG TERM GOAL #4   Title Pt will be educated in skin care, and precautions for lymphedema    Status On-going                   Plan - 03/15/21 1203     Clinical Impression Statement Pt came in unwrapped today so that she could take a shower. Continued CDT including MLD and compression bandaging.  Pt is still awaiting  daytime garment and to hear from [place].    Personal Factors and Comorbidities Age;Comorbidity 3+    Comorbidities Right lumpectomy with SLNB and radiation, Recent Reverse right TSA, Cervical Fusion, LBP, OA,hypertension    Examination-Activity Limitations Lift;Reach Overhead;Locomotion Level    Examination-Participation Restrictions Cleaning    Stability/Clinical Decision Making Stable/Uncomplicated    Rehab Potential Good    PT Frequency 3x / week    PT Duration 6 weeks    PT Treatment/Interventions ADLs/Self Care Home Management;Therapeutic exercise;Manual techniques;Orthotic Fit/Training;Patient/family education;Manual lymph drainage;Compression bandaging;Scar mobilization;Passive range of motion    PT Next Visit Plan MLD, compression bandaging, show pt night garment options    Consulted and Agree with Plan of Care Patient             Patient will benefit from skilled therapeutic intervention in order to improve the following deficits and impairments:  Decreased  range of motion, Decreased scar mobility, Decreased strength, Increased edema, Postural dysfunction, Impaired UE functional use  Visit Diagnosis: Lymphedema, not elsewhere classified  Stiffness of right shoulder, not elsewhere classified  Muscle weakness (generalized)  Aftercare following surgery for neoplasm     Problem List Patient Active Problem List   Diagnosis Date Noted  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   Allergic rhinitis due to animal (cat) (dog) hair and dander 07/18/2020   Leiomyoma 07/18/2020   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   Anxiety state    Acute blood loss anemia    Postoperative pain    Cervical myelopathy ([facility]) 09/22/2019   S/P cervical spinal fusion 09/22/2019   Cervical spondylosis with myelopathy and radiculopathy 09/15/2019   AKI (acute kidney injury) ([place]) 09/14/2019   GERD (gastroesophageal reflux disease) 09/14/2019   Spinal stenosis in cervical region 09/14/2019   Weakness 09/14/2019   Numbness and tingling 09/14/2019   Cervical spinal stenosis 09/14/2019   Status post cataract extraction and insertion of intraocular lens of left eye 09/04/2019   Lumbar stenosis 08/24/2019   Allergic rhinitis due to pollen 06/06/2019   Persistent cough 06/06/2019   History of Descemet membrane endothelial keratoplasty (DMEK) 01/04/2019   Status post cataract extraction and insertion of intraocular lens of right eye 01/04/2019   Senile nuclear sclerosis 01/02/2019   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   Anatomical narrow angle, 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   Obesity, morbid, BMI 40.0-49.9 ([place]) 04/19/2018   Morbid obesity ([place]) 04/19/2018   Cataract    Fibroid    Endometriosis    DM (diabetes mellitus) type II controlled with renal manifestation ([place]) 06/17/2010   Dyslipidemia 06/17/2010   Microcytic anemia 06/17/2010   Essential hypertension 06/17/2010   Allergic rhinitis 06/17/2010   Osteoarthritis  06/17/2010   LOW BACK PAIN 06/17/2010   OSTEOPENIA 06/17/2010   BREAST CANCER, HX OF 06/17/2010   Disorder of skeletal system 06/17/2010    Claris Pong, PT 03/15/2021, 12:06 PM  [place], [place], [address] Phone: [phone]   Fax:  [phone]  Name: Doris Jackson MRN: [id number] Date of Birth: 1941-04-29

## 2021-03-18 ENCOUNTER — Ambulatory Visit: Payer: Medicare HMO

## 2021-03-18 ENCOUNTER — Other Ambulatory Visit: Payer: Self-pay

## 2021-03-18 DIAGNOSIS — M545 Low back pain, unspecified: Secondary | ICD-10-CM | POA: Diagnosis not present

## 2021-03-18 DIAGNOSIS — R262 Difficulty in walking, not elsewhere classified: Secondary | ICD-10-CM | POA: Diagnosis not present

## 2021-03-18 DIAGNOSIS — M6281 Muscle weakness (generalized): Secondary | ICD-10-CM | POA: Diagnosis not present

## 2021-03-18 DIAGNOSIS — M25611 Stiffness of right shoulder, not elsewhere classified: Secondary | ICD-10-CM

## 2021-03-18 DIAGNOSIS — G8929 Other chronic pain: Secondary | ICD-10-CM | POA: Diagnosis not present

## 2021-03-18 DIAGNOSIS — I890 Lymphedema, not elsewhere classified: Secondary | ICD-10-CM

## 2021-03-18 DIAGNOSIS — Z483 Aftercare following surgery for neoplasm: Secondary | ICD-10-CM

## 2021-03-18 NOTE — Therapy (Addendum)
[place], [place], [address] Phone: [phone]   Fax:  [phone]  Physical Therapy Treatment  Patient Details  Name: Doris Jackson MRN: [id number] Date of Birth: 1941-06-06 Referring Provider (PT): Dr. Griffin Basil   Encounter Date: 03/18/2021   PT End of Session - 03/18/21 1406     Visit Number 18    Number of Visits 30    Date for PT Re-Evaluation 04/15/2021    PT Start Time 1300    PT Stop Time 8588    PT Time Calculation (min) 59 min    Activity Tolerance Patient tolerated treatment well    Behavior During Therapy [facility] for tasks assessed/performed             Past Medical History:  Diagnosis Date   ALLERGIC RHINITIS 06/17/2010   ANEMIA-NOS 06/17/2010   BREAST CANCER, HX OF 06/17/2010   Breast cancer, right ([place]) 2008   Cataract    COPD 06/17/2010   pt denies   DIABETES MELLITUS, TYPE II 06/17/2010   Endometriosis    Fibroid    FIBROID   GERD (gastroesophageal reflux disease)    HYPERLIPIDEMIA 06/17/2010   HYPERTENSION 06/17/2010   Leg swelling    LOW BACK PAIN 06/17/2010   OSTEOARTHRITIS 06/17/2010   OSTEOPENIA 06/17/2010   Weakness     Past Surgical History:  Procedure Laterality Date   ABDOMINAL HYSTERECTOMY  1988   TAH,LSO   ANTERIOR CERVICAL DECOMP/DISCECTOMY FUSION N/A 09/15/2019   Procedure: Cervical five CorpectomyANTERIOR CERVICAL DECOMPRESSION/DISCECTOMY FUSION CERVICAL THREE-CERVICAL Four;  Surgeon: Ashok Pall, MD;  Location: [place];  Service: Neurosurgery;  Laterality: N/A;   BREAST LUMPECTOMY Right 2007   LUMPECTOMY FOLLOWED BY RADIATION   COLONOSCOPY     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          LYMPHEDEMA/ONCOLOGY QUESTIONNAIRE - 03/18/21 0001       Right Upper Extremity Lymphedema   15 cm Proximal to Olecranon Process 40.8 cm    10 cm Proximal to Olecranon Process 44.5 cm    Olecranon Process 29.1 cm    15 cm Proximal to Ulnar Styloid Process 27.9 cm    10 cm Proximal to Ulnar Styloid Process 25.6 cm    Just Proximal to Ulnar Styloid Process 19.9 cm    Across Hand at [employer] 19.2 cm    At [place] of 2nd Digit 6.2 cm                        [facility] Adult PT Treatment/Exercise - 03/18/21 0001       Manual Therapy   Manual therapy comments wraps removed and pt remeasured. lotion applied    Manual Lymphatic Drainage (MLD) In supine with head of bed elevated: short neck, 5 diaphragmatic breaths, left axillary LN's, right inguinal LN's, anterior interaxillary pathway,  right axillo inguinal pathway and right UE starting proximally and working distally, retracing all steps.    Compression Bandaging Large TG soft turned inside out due to skin irritation at upper arm, and  medium at lower arm.  10 cm soft comprilan foam applied wrist to axilla.  fingers 1-4 wrapped with elastomull, 6 cm to hand and wrist, 10 cm wrist to mid upper arm, and 12 cm  from mid forearm to axilla. 12 cm wrist to axilla. TG soft pulled down over top of bandage.                          PT Long Term Goals - 02/27/21 1220       PT LONG TERM GOAL #1   Title Pt will have decreased edema  at 15 cm prox to ulna and olecranon by 2-4 cm    Baseline 1.5 cm decrease 15 cm prox to ulna, 5.6 cm decrease 15 cm prox to olecranon    Time 6    Period Weeks    Status Partially Met      PT LONG  TERM GOAL #2   Title Pts husband will be independent with compression bandaging to help reduce right UE swelling.    Time 6    Period Weeks    Status On-going      PT LONG TERM GOAL #3   Title Pt will be fit for appropiate compression garments and will be independent with husbands help in their use .    Time 6    Period Weeks fit for garment, but not yet received   Status On-going      PT LONG TERM GOAL #4   Title Pt will be educated in skin care, and precautions for lymphedema    Status On-going                   Plan - 03/18/21 1407     Clinical Impression Statement Wraps removed and pt remeasured for re-certification.  Meaurements improved from last visit. Continued manual lymph drainage and compression bandaging. pt tolerates a wrap with good pressure, and wrap remains intact very well until next visit. Pt is awaiting her new sleeve and glove, as well as information from [employer] regarding the pump/. We will continue MLD and compression bandaging until pt receives her garments.    Personal Factors and Comorbidities Age;Comorbidity 3+    Comorbidities Right lumpectomy with SLNB and radiation, Recent Reverse right TSA, Cervical Fusion, LBP, OA,hypertension    Examination-Activity Limitations Lift;Reach Overhead;Locomotion Level    Examination-Participation Restrictions Cleaning    Stability/Clinical Decision Making Stable/Uncomplicated    Rehab Potential Good    PT Frequency 3x / week    PT Duration 4 weeks    PT Treatment/Interventions ADLs/Self Care Home Management;Therapeutic exercise;Manual techniques;Orthotic Fit/Training;Patient/family education;Manual lymph drainage;Compression bandaging;Scar mobilization;Passive range of motion    PT Next Visit Plan MLD, compression bandaging, show pt night garment options while awaiting garments/flexitouch info    Consulted and Agree with Plan of Care Patient             Patient will benefit from skilled therapeutic  intervention in order to improve the following deficits and impairments:  Decreased range of motion, Decreased scar mobility, Decreased strength, Increased edema, Postural dysfunction, Impaired UE functional use  Visit Diagnosis: Lymphedema, not elsewhere classified  Stiffness of right shoulder, not elsewhere classified  Muscle weakness (generalized)  Aftercare following surgery for neoplasm  Problem List Patient Active Problem List   Diagnosis Date Noted   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   Allergic rhinitis due to animal (cat) (dog) hair and dander 07/18/2020   Leiomyoma 07/18/2020   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   Anxiety state    Acute blood loss anemia    Postoperative pain    Cervical myelopathy ([place]) 09/22/2019   S/P cervical spinal fusion 09/22/2019   Cervical spondylosis with myelopathy and radiculopathy 09/15/2019   AKI (acute kidney injury) ([place]) 09/14/2019   GERD (gastroesophageal reflux disease) 09/14/2019   Spinal stenosis in cervical region 09/14/2019   Weakness 09/14/2019   Numbness and tingling 09/14/2019   Cervical spinal stenosis 09/14/2019   Status post cataract extraction and insertion of intraocular lens of left eye 09/04/2019   Lumbar stenosis 08/24/2019   Allergic rhinitis due to pollen 06/06/2019   Persistent cough 06/06/2019   History of Descemet membrane endothelial keratoplasty (DMEK) 01/04/2019   Status post cataract extraction and insertion of intraocular lens of right eye 01/04/2019   Senile nuclear sclerosis 01/02/2019   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   Anatomical narrow angle, 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   Obesity, morbid, BMI 40.0-49.9 ([place]) 04/19/2018   Morbid obesity ([place]) 04/19/2018   Cataract    Fibroid    Endometriosis    DM (diabetes mellitus) type II controlled with renal manifestation ([place]) 06/17/2010   Dyslipidemia 06/17/2010   Microcytic anemia 06/17/2010    Essential hypertension 06/17/2010   Allergic rhinitis 06/17/2010   Osteoarthritis 06/17/2010   LOW BACK PAIN 06/17/2010   OSTEOPENIA 06/17/2010   BREAST CANCER, HX OF 06/17/2010   Disorder of skeletal system 06/17/2010    Claris Pong, PT 03/18/2021, 2:11 PM  [place], [place], [address] Phone: [phone]   Fax:  [phone]  Name: MERIN BORJON MRN: [id number] Date of Birth: 01/16/1941

## 2021-03-19 ENCOUNTER — Ambulatory Visit (INDEPENDENT_AMBULATORY_CARE_PROVIDER_SITE_OTHER): Payer: Medicare HMO | Admitting: *Deleted

## 2021-03-19 DIAGNOSIS — Z23 Encounter for immunization: Secondary | ICD-10-CM | POA: Diagnosis not present

## 2021-03-19 DIAGNOSIS — I890 Lymphedema, not elsewhere classified: Secondary | ICD-10-CM | POA: Diagnosis not present

## 2021-03-20 ENCOUNTER — Ambulatory Visit: Payer: Medicare HMO

## 2021-03-20 ENCOUNTER — Other Ambulatory Visit: Payer: Self-pay

## 2021-03-20 DIAGNOSIS — M25611 Stiffness of right shoulder, not elsewhere classified: Secondary | ICD-10-CM

## 2021-03-20 DIAGNOSIS — I890 Lymphedema, not elsewhere classified: Secondary | ICD-10-CM

## 2021-03-20 DIAGNOSIS — Z483 Aftercare following surgery for neoplasm: Secondary | ICD-10-CM

## 2021-03-20 DIAGNOSIS — M6281 Muscle weakness (generalized): Secondary | ICD-10-CM

## 2021-03-20 DIAGNOSIS — M545 Low back pain, unspecified: Secondary | ICD-10-CM | POA: Diagnosis not present

## 2021-03-20 DIAGNOSIS — R262 Difficulty in walking, not elsewhere classified: Secondary | ICD-10-CM | POA: Diagnosis not present

## 2021-03-20 DIAGNOSIS — G8929 Other chronic pain: Secondary | ICD-10-CM | POA: Diagnosis not present

## 2021-03-20 NOTE — Therapy (Addendum)
[place], [place], [address] Phone: [phone]   Fax:  [phone]  Physical Therapy Treatment  Patient Details  Name: Doris Jackson MRN: [id number] Date of Birth: 1940/09/08 Referring Provider (PT): Dr. Griffin Basil   Encounter Date: 03/20/2021   PT End of Session - 03/20/21 1354     Visit Number 19    Number of Visits 30    Date for PT Re-Evaluation 04/15/21    PT Start Time 7544    PT Stop Time 9201    PT Time Calculation (min) 49 min    Activity Tolerance Patient tolerated treatment well    Behavior During Therapy [facility] for tasks assessed/performed             Past Medical History:  Diagnosis Date   ALLERGIC RHINITIS 06/17/2010   ANEMIA-NOS 06/17/2010   BREAST CANCER, HX OF 06/17/2010   Breast cancer, right ([place]) 2008   Cataract    COPD 06/17/2010   pt denies   DIABETES MELLITUS, TYPE II 06/17/2010   Endometriosis    Fibroid    FIBROID   GERD (gastroesophageal reflux disease)    HYPERLIPIDEMIA 06/17/2010   HYPERTENSION 06/17/2010   Leg swelling    LOW BACK PAIN 06/17/2010   OSTEOARTHRITIS 06/17/2010   OSTEOPENIA 06/17/2010   Weakness     Past Surgical History:  Procedure Laterality Date   ABDOMINAL HYSTERECTOMY  1988   TAH,LSO   ANTERIOR CERVICAL DECOMP/DISCECTOMY FUSION N/A 09/15/2019   Procedure: Cervical five CorpectomyANTERIOR CERVICAL DECOMPRESSION/DISCECTOMY FUSION CERVICAL THREE-CERVICAL Four;  Surgeon: Ashok Pall, MD;  Location: [place];  Service: Neurosurgery;  Laterality: N/A;   BREAST LUMPECTOMY Right 2007   LUMPECTOMY FOLLOWED BY RADIATION   COLONOSCOPY     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   Subjective Assessment - 03/20/21 1310     Subjective The wrap stayed up well    Pertinent History Pt is s/p right breast lumpectomy with radiation in 2007.  She has had some swelling for a number of years, but has never been treated.  She suffered a fall at [facility] in March and fractured her righ shoulder resulting in a Right reverse total Shoulder.  After that she noticed more swelling in the right UE and was referred by Dr. Griffin Basil for lymphedema treatment. She is also diabetic and has hypertension. She has LBP, prior cervical fusion, OA. She is using a rollator    Patient Stated Goals Decrease swelling in arm, gain information from provider    Currently in Pain? No/denies    Multiple Pain Sites No                               [facility] Adult PT Treatment/Exercise - 03/20/21 0001       Manual Therapy   Manual therapy comments  wraps removed and pt remeasured. lotion applied    Manual Lymphatic Drainage (MLD) In supine with head of bed elevated: short neck, 5 diaphragmatic breaths, left axillary LN's, right inguinal LN's, anterior interaxillary pathway, right axillo inguinal pathway and right UE starting proximally and working distally, retracing all steps.    Compression Bandaging Large TG soft turned inside out due to skin irritation at upper arm, and  medium at lower arm.  10 cm soft comprilan foam applied wrist to axilla.  fingers 1-4 wrapped with elastomull, 6 cm to hand and wrist, 10 cm wrist to mid upper arm, and 12 cm  from mid forearm to axilla. 12 cm wrist to axilla. TG soft pulled down over top of bandage.                          PT Long Term Goals - 02/27/21 1220        PT LONG TERM GOAL #1   Title Pt will have decreased edema  at 15 cm prox to ulna and olecranon by 2-4 cm    Baseline 1.5 cm decrease 15 cm prox to ulna, 5.6 cm decrease 15 cm prox to olecranon    Time 6    Period Weeks    Status Partially Met      PT LONG TERM GOAL #2   Title Pts husband will be independent with compression bandaging to help reduce right UE swelling.    Time 6    Period Weeks    Status On-going      PT LONG TERM GOAL #3   Title Pt will be fit for appropiate compression garments and will be independent with husbands help in their use    Time 6    Period Weeks    Status On-going      PT LONG TERM GOAL #4   Title Pt will be educated in skin care, and precautions for lymphedema    Status On-going                   Plan - 03/20/21 1708     Clinical Impression Statement Wraps removed and pt washed arm.  Continued Manual lymph drainage and compression bandaging.  Sent info again regarding financial documents to [employer] for pt, assistance. Still awaiting her new sleeve and glove and must continue treatment until it is received.    Personal Factors and Comorbidities Age;Comorbidity 3+    Comorbidities Right lumpectomy with SLNB and radiation, Recent Reverse right TSA, Cervical Fusion, LBP, OA,hypertension    Examination-Activity Limitations Lift;Reach Overhead;Locomotion Level    Examination-Participation Restrictions Cleaning    Stability/Clinical Decision Making Stable/Uncomplicated    Rehab Potential Good    PT Frequency 3x / week    PT Duration 4 weeks    PT Treatment/Interventions ADLs/Self Care Home Management;Therapeutic exercise;Manual techniques;Orthotic Fit/Training;Patient/family education;Manual lymph drainage;Compression bandaging;Scar mobilization;Passive range of motion    PT Next Visit Plan MLD, compression bandaging, show pt night garment options while awaiting garments/flexitouch info    Recommended Other Services all financial info sent  for flexitouch patient  assistance    Consulted and Agree with Plan of Care Patient             Patient will benefit from skilled therapeutic intervention in order to improve the following deficits and impairments:  Decreased range of motion, Decreased scar mobility, Decreased strength, Increased edema, Postural dysfunction, Impaired UE functional use  Visit Diagnosis: Lymphedema, not elsewhere  classified  Stiffness of right shoulder, not elsewhere classified  Muscle weakness (generalized)  Aftercare following surgery for neoplasm     Problem List Patient Active Problem List   Diagnosis Date Noted   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   Allergic rhinitis due to animal (cat) (dog) hair and dander 07/18/2020   Leiomyoma 07/18/2020   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   Anxiety state    Acute blood loss anemia    Postoperative pain    Cervical myelopathy ([place]) 09/22/2019   S/P cervical spinal fusion 09/22/2019   Cervical spondylosis with myelopathy and radiculopathy 09/15/2019   AKI (acute kidney injury) ([place]) 09/14/2019   GERD (gastroesophageal reflux disease) 09/14/2019   Spinal stenosis in cervical region 09/14/2019   Weakness 09/14/2019   Numbness and tingling 09/14/2019   Cervical spinal stenosis 09/14/2019   Status post cataract extraction and insertion of intraocular lens of left eye 09/04/2019   Lumbar stenosis 08/24/2019   Allergic rhinitis due to pollen 06/06/2019   Persistent cough 06/06/2019   History of Descemet membrane endothelial keratoplasty (DMEK) 01/04/2019   Status post cataract extraction and insertion of intraocular lens of right eye 01/04/2019   Senile nuclear sclerosis 01/02/2019   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   Anatomical narrow angle, 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   Obesity, morbid, BMI 40.0-49.9 ([place]) 04/19/2018   Morbid obesity ([place]) 04/19/2018   Cataract    Fibroid     Endometriosis    DM (diabetes mellitus) type II controlled with renal manifestation ([place]) 06/17/2010   Dyslipidemia 06/17/2010   Microcytic anemia 06/17/2010   Essential hypertension 06/17/2010   Allergic rhinitis 06/17/2010   Osteoarthritis 06/17/2010   LOW BACK PAIN 06/17/2010   OSTEOPENIA 06/17/2010   BREAST CANCER, HX OF 06/17/2010   Disorder of skeletal system 06/17/2010    Claris Pong, PT 03/20/2021, 5:12 PM  [place], [place], [address] Phone: [phone]   Fax:  [phone]  Name: AVANELLE PIXLEY MRN: [id number] Date of Birth: 01-08-1941

## 2021-03-22 ENCOUNTER — Other Ambulatory Visit: Payer: Self-pay

## 2021-03-22 ENCOUNTER — Ambulatory Visit: Payer: Medicare HMO

## 2021-03-22 DIAGNOSIS — M6281 Muscle weakness (generalized): Secondary | ICD-10-CM | POA: Diagnosis not present

## 2021-03-22 DIAGNOSIS — I890 Lymphedema, not elsewhere classified: Secondary | ICD-10-CM | POA: Diagnosis not present

## 2021-03-22 DIAGNOSIS — Z483 Aftercare following surgery for neoplasm: Secondary | ICD-10-CM | POA: Diagnosis not present

## 2021-03-22 DIAGNOSIS — M25611 Stiffness of right shoulder, not elsewhere classified: Secondary | ICD-10-CM

## 2021-03-22 DIAGNOSIS — G8929 Other chronic pain: Secondary | ICD-10-CM | POA: Diagnosis not present

## 2021-03-22 DIAGNOSIS — M545 Low back pain, unspecified: Secondary | ICD-10-CM | POA: Diagnosis not present

## 2021-03-22 DIAGNOSIS — R262 Difficulty in walking, not elsewhere classified: Secondary | ICD-10-CM | POA: Diagnosis not present

## 2021-03-22 NOTE — Therapy (Deleted)
[place], [place], [address] Phone: [phone]   Fax:  [phone]  Patient Details  Name: Doris Jackson MRN: [id number] Date of Birth: [birth year] Referring Provider:  Caren Macadam, MD  Encounter Date: 03/22/2021   Claris Pong 03/22/2021, 12:18 PM  [place], [place], [address] Phone: [phone]   Fax:  [phone]

## 2021-03-22 NOTE — Therapy (Addendum)
[place], [place], [address] Phone: [phone]   Fax:  [phone]  Physical Therapy Treatment  Patient Details  Name: Doris Jackson MRN: [id number] Date of Birth: 27-Sep-1940 Referring Provider (PT): Dr. Griffin Basil   Encounter Date: 03/22/2021   PT End of Session - 03/22/21 1208     Visit Number 20    Number of Visits 30    Date for PT Re-Evaluation 04/15/21    PT Start Time 1100    PT Stop Time 1200    PT Time Calculation (min) 60 min    Activity Tolerance Patient tolerated treatment well    Behavior During Therapy [facility] for tasks assessed/performed             Past Medical History:  Diagnosis Date   ALLERGIC RHINITIS 06/17/2010   ANEMIA-NOS 06/17/2010   BREAST CANCER, HX OF 06/17/2010   Breast cancer, right ([place]) 2008   Cataract    COPD 06/17/2010   pt denies   DIABETES MELLITUS, TYPE II 06/17/2010   Endometriosis    Fibroid    FIBROID   GERD (gastroesophageal reflux disease)    HYPERLIPIDEMIA 06/17/2010   HYPERTENSION 06/17/2010   Leg swelling    LOW BACK PAIN 06/17/2010   OSTEOARTHRITIS 06/17/2010   OSTEOPENIA 06/17/2010   Weakness     Past Surgical History:  Procedure Laterality Date   ABDOMINAL HYSTERECTOMY  1988   TAH,LSO   ANTERIOR CERVICAL DECOMP/DISCECTOMY FUSION N/A 09/15/2019   Procedure: Cervical five CorpectomyANTERIOR CERVICAL DECOMPRESSION/DISCECTOMY FUSION CERVICAL THREE-CERVICAL Four;  Surgeon: Ashok Pall, MD;  Location: [place];  Service: Neurosurgery;  Laterality: N/A;   BREAST LUMPECTOMY Right 2007   LUMPECTOMY FOLLOWED BY RADIATION   COLONOSCOPY     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   Subjective Assessment - 03/22/21 1100     Subjective The wrap did well. My sleeve arrived yesterday but not the glove.    Pertinent History Pt is s/p right breast lumpectomy with radiation in 2007.  She has had some swelling for a number of years, but has never been treated.  She suffered a fall at [facility] in March and fractured her righ shoulder resulting in a Right reverse total Shoulder.  After that she noticed more swelling in the right UE and was referred by Dr. Griffin Basil for lymphedema treatment. She is also diabetic and has hypertension. She has LBP, prior cervical fusion, OA. She is using a rollator    Patient Stated Goals Decrease swelling in arm, gain information from provider    Currently in Pain? No/denies                               [facility] Adult PT Treatment/Exercise - 03/22/21 0001       Manual Therapy   Manual therapy comments  wraps removed and had pt don sleeve using green arm slip eze. She required some assist especially at the top to get soft tissue in. Sleeve is about 3.5 cm short at the top,    Manual Lymphatic Drainage (MLD) In supine with head of bed elevated: short neck, 5 diaphragmatic breaths, left axillary LN's, right inguinal LN's, anterior interaxillary pathway, right axillo inguinal pathway and right UE starting proximally and working distally, retracing all steps.    Compression Bandaging Large TG soft turned inside out due to skin irritation at upper arm, and  medium at lower arm.  10 cm soft comprilan foam applied wrist to axilla.  fingers 1-4 wrapped with elastomull, 6 cm to hand and wrist, 10 cm wrist to mid upper arm, and 12 cm  from mid forearm to axilla. 12 cm wrist to  axilla. TG soft pulled down over top of bandage.                          PT Long Term Goals - 02/27/21 1220       PT LONG TERM GOAL #1   Title Pt will have decreased edema  at 15 cm prox to ulna and olecranon by 2-4 cm    Baseline 1.5 cm decrease 15 cm prox to ulna, 5.6 cm decrease 15 cm prox to olecranon    Time 6    Period Weeks    Status Partially Met      PT LONG TERM GOAL #2   Title Pts husband will be independent with compression bandaging to help reduce right UE swelling.    Time 6    Period Weeks    Status On-going      PT LONG TERM GOAL #3   Title Pt will be fit for appropiate compression garments and will be independent with husbands help in their use    Time 6    Period Weeks    Status On-going      PT LONG TERM GOAL #4   Title Pt will be educated in skin care, and precautions for lymphedema    Status On-going                   Plan - 03/22/21 1208     Clinical Impression Statement Pt brought her new sleeve but did not receive her glove yet.  We removed wraps and used green slipeze to try her sleeve on. She did quite well getting in on but required assist at the top of the sleeve to tuck in soft tissue.  Sleeve is about 3.5 cm too short at the top. We contiued MLD and compression bandaging.  Pt has only 2 appts next week. She is awaiting flexitouch which should arrive at her home this weekend    Personal Factors and Comorbidities Age;Comorbidity 3+    Comorbidities Right lumpectomy with SLNB and radiation, Recent Reverse right TSA, Cervical Fusion, LBP, OA,hypertension    Examination-Activity Limitations Lift;Reach Overhead;Locomotion Level    Examination-Participation Restrictions Cleaning    Stability/Clinical Decision Making Stable/Uncomplicated    Rehab Potential Good    PT Frequency 3x / week    PT Duration 4 weeks    PT Treatment/Interventions ADLs/Self Care Home Management;Therapeutic exercise;Manual techniques;Orthotic  Fit/Training;Patient/family education;Manual lymph drainage;Compression bandaging;Scar mobilization;Passive range of motion    PT Next Visit Plan MLD, compression bandaging, show pt night garment options while awaiting garments/flexitouch info    Consulted and Agree with Plan of Care  Patient             Patient will benefit from skilled therapeutic intervention in order to improve the following deficits and impairments:  Decreased range of motion, Decreased scar mobility, Decreased strength, Increased edema, Postural dysfunction, Impaired UE functional use  Visit Diagnosis: Lymphedema, not elsewhere classified  Stiffness of right shoulder, not elsewhere classified  Muscle weakness (generalized)  Aftercare following surgery for neoplasm     Problem List Patient Active Problem List   Diagnosis Date Noted   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   Allergic rhinitis due to animal (cat) (dog) hair and dander 07/18/2020   Leiomyoma 07/18/2020   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   Anxiety state    Acute blood loss anemia    Postoperative pain    Cervical myelopathy ([facility]) 09/22/2019   S/P cervical spinal fusion 09/22/2019   Cervical spondylosis with myelopathy and radiculopathy 09/15/2019   AKI (acute kidney injury) ([place]) 09/14/2019   GERD (gastroesophageal reflux disease) 09/14/2019   Spinal stenosis in cervical region 09/14/2019   Weakness 09/14/2019   Numbness and tingling 09/14/2019   Cervical spinal stenosis 09/14/2019   Status post cataract extraction and insertion of intraocular lens of left eye 09/04/2019   Lumbar stenosis 08/24/2019   Allergic rhinitis due to pollen 06/06/2019   Persistent cough 06/06/2019   History of Descemet membrane endothelial keratoplasty (DMEK) 01/04/2019   Status post cataract extraction and insertion of intraocular lens of right eye 01/04/2019   Senile nuclear sclerosis 01/02/2019    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   Anatomical narrow angle, 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   Obesity, morbid, BMI 40.0-49.9 ([place]) 04/19/2018   Morbid obesity ([place]) 04/19/2018   Cataract    Fibroid    Endometriosis    DM (diabetes mellitus) type II controlled with renal manifestation ([place]) 06/17/2010   Dyslipidemia 06/17/2010   Microcytic anemia 06/17/2010   Essential hypertension 06/17/2010   Allergic rhinitis 06/17/2010   Osteoarthritis 06/17/2010   LOW BACK PAIN 06/17/2010   OSTEOPENIA 06/17/2010   BREAST CANCER, HX OF 06/17/2010   Disorder of skeletal system 06/17/2010    Claris Pong, PT 03/22/2021, 12:20 PM  [place], [place], [address] Phone: [phone]   Fax:  [phone]  Name: ANTONEA GAUT MRN: [id number] Date of Birth: [birth year]

## 2021-03-25 ENCOUNTER — Other Ambulatory Visit: Payer: Self-pay

## 2021-03-25 ENCOUNTER — Ambulatory Visit: Payer: Medicare HMO

## 2021-03-25 DIAGNOSIS — Z483 Aftercare following surgery for neoplasm: Secondary | ICD-10-CM

## 2021-03-25 DIAGNOSIS — M6281 Muscle weakness (generalized): Secondary | ICD-10-CM | POA: Diagnosis not present

## 2021-03-25 DIAGNOSIS — I890 Lymphedema, not elsewhere classified: Secondary | ICD-10-CM

## 2021-03-25 DIAGNOSIS — M25611 Stiffness of right shoulder, not elsewhere classified: Secondary | ICD-10-CM | POA: Diagnosis not present

## 2021-03-25 DIAGNOSIS — M545 Low back pain, unspecified: Secondary | ICD-10-CM | POA: Diagnosis not present

## 2021-03-25 DIAGNOSIS — J3089 Other allergic rhinitis: Secondary | ICD-10-CM | POA: Diagnosis not present

## 2021-03-25 DIAGNOSIS — R262 Difficulty in walking, not elsewhere classified: Secondary | ICD-10-CM | POA: Diagnosis not present

## 2021-03-25 DIAGNOSIS — J301 Allergic rhinitis due to pollen: Secondary | ICD-10-CM | POA: Diagnosis not present

## 2021-03-25 DIAGNOSIS — G8929 Other chronic pain: Secondary | ICD-10-CM | POA: Diagnosis not present

## 2021-03-25 NOTE — Therapy (Addendum)
[place], [place], [address] Phone: [phone]   Fax:  [phone]  Physical Therapy Treatment  Patient Details  Name: Doris Jackson MRN: [id number] Date of Birth: 05-12-1941 Referring Provider (PT): Dr. Griffin Basil   Encounter Date: 03/25/2021   PT End of Session - 03/25/21 1200     Visit Number 21    Number of Visits 30    Date for PT Re-Evaluation 04/15/2021    PT Start Time 1103    PT Stop Time 1150    PT Time Calculation (min) 47 min    Activity Tolerance Patient tolerated treatment well    Behavior During Therapy [facility] for tasks assessed/performed             Past Medical History:  Diagnosis Date   ALLERGIC RHINITIS 06/17/2010   ANEMIA-NOS 06/17/2010   BREAST CANCER, HX OF 06/17/2010   Breast cancer, right ([place]) 2008   Cataract    COPD 06/17/2010   pt denies   DIABETES MELLITUS, TYPE II 06/17/2010   Endometriosis    Fibroid    FIBROID   GERD (gastroesophageal reflux disease)    HYPERLIPIDEMIA 06/17/2010   HYPERTENSION 06/17/2010   Leg swelling    LOW BACK PAIN 06/17/2010   OSTEOARTHRITIS 06/17/2010   OSTEOPENIA 06/17/2010   Weakness     Past Surgical History:  Procedure Laterality Date   ABDOMINAL HYSTERECTOMY  1988   TAH,LSO   ANTERIOR CERVICAL DECOMP/DISCECTOMY FUSION N/A 09/15/2019   Procedure: Cervical five CorpectomyANTERIOR CERVICAL DECOMPRESSION/DISCECTOMY FUSION CERVICAL THREE-CERVICAL Four;  Surgeon: Ashok Pall, MD;  Location: [place];  Service: Neurosurgery;  Laterality: N/A;   BREAST LUMPECTOMY Right 2007   LUMPECTOMY FOLLOWED BY RADIATION   COLONOSCOPY     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   Subjective Assessment - 03/25/21 1159     Subjective The big box with the pump arrived this weekend. I didn't open it yet because I can't use it until we finish.    Pertinent History Pt is s/p right breast lumpectomy with radiation in 2007.  She has had some swelling for a number of years, but has never been treated.  She suffered a fall at [facility] in March and fractured her righ shoulder resulting in a Right reverse total Shoulder.  After that she noticed more swelling in the right UE and was referred by Dr. Griffin Basil for lymphedema treatment. She is also diabetic and has hypertension. She has LBP, prior cervical fusion, OA. She is using a rollator    Patient Stated Goals Decrease swelling in arm, gain information from provider    Currently in Pain? No/denies    Multiple Pain Sites No                               [facility] Adult PT  Treatment/Exercise - 03/25/21 0001       Manual Therapy   Manual therapy comments wraps removed and arm washed. Lotion applied    Manual Lymphatic Drainage (MLD) In supine with head of bed elevated: short neck, 5 diaphragmatic breaths, left axillary LN's, right inguinal LN's, anterior interaxillary pathway, right axillo inguinal pathway and right UE starting proximally and working distally, retracing all steps.    Compression Bandaging Large TG soft turned inside out due to skin irritation at upper arm, and  medium at lower arm.  10 cm soft comprilan foam applied wrist to axilla.  fingers 1-4 wrapped with elastomull, 6 cm to hand and wrist, 10 cm wrist to mid upper arm, and 12 cm  from mid forearm to axilla. 12 cm wrist to axilla. TG soft pulled down over top of bandage.                           PT Long Term Goals - 02/27/21 1220       PT LONG TERM GOAL #1   Title Pt will have decreased edema  at 15 cm prox to ulna and olecranon by 2-4 cm    Baseline 1.5 cm decrease 15 cm prox to ulna, 5.6 cm decrease 15 cm prox to olecranon    Time 6    Period Weeks    Status Partially Met      PT LONG TERM GOAL #2   Title Pts husband will be independent with compression bandaging to help reduce right UE swelling.    Time 6    Period Weeks    Status On-going      PT LONG TERM GOAL #3   Title Pt will be fit for appropiate compression garments and will be independent with husbands help in their use    Time 6    Period Weeks    Status On-going      PT LONG TERM GOAL #4   Title Pt will be educated in skin care, and precautions for lymphedema    Status On-going                   Plan - 03/25/21 1201     Clinical Impression Statement Pt received her flexi touch in the mail this weekend.  She has not received the glove yet, and her sleeve needs to be remade a little longer so she will require continuation of MLD and bandaging until her new sleeve arrives. She has been very compliant with remaining in the bandages and they stay up very well.   Personal Factors and Comorbidities Age;Comorbidity 3+    Comorbidities Right lumpectomy with SLNB and radiation, Recent Reverse right TSA, Cervical Fusion, LBP, OA,hypertension    Examination-Activity Limitations Lift;Reach Overhead;Locomotion Level    Examination-Participation Restrictions Cleaning    Stability/Clinical Decision Making Stable/Uncomplicated    Rehab Potential Good    PT Frequency 3x / week    PT Duration 4 weeks    PT Treatment/Interventions ADLs/Self Care Home Management;Therapeutic exercise;Manual techniques;Orthotic Fit/Training;Patient/family education;Manual lymph drainage;Compression bandaging;Scar mobilization;Passive range of motion    PT Next Visit Plan MLD, compression bandaging,  show pt night garment options while awaiting garments/flexitouch info    Consulted and Agree with Plan of Care Patient             Patient will benefit from skilled therapeutic intervention in order to improve the following deficits and impairments:  Decreased range of motion, Decreased scar  mobility, Decreased strength, Increased edema, Postural dysfunction, Impaired UE functional use  Visit Diagnosis: Lymphedema, not elsewhere classified  Stiffness of right shoulder, not elsewhere classified  Muscle weakness (generalized)  Aftercare following surgery for neoplasm     Problem List Patient Active Problem List   Diagnosis Date Noted   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   Allergic rhinitis due to animal (cat) (dog) hair and dander 07/18/2020   Leiomyoma 07/18/2020   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   Anxiety state    Acute blood loss anemia    Postoperative pain    Cervical myelopathy ([place]) 09/22/2019   S/P cervical spinal fusion 09/22/2019   Cervical spondylosis with myelopathy and radiculopathy 09/15/2019   AKI (acute kidney injury) ([place]) 09/14/2019   GERD (gastroesophageal reflux disease) 09/14/2019   Spinal stenosis in cervical region 09/14/2019   Weakness 09/14/2019   Numbness and tingling 09/14/2019   Cervical spinal stenosis 09/14/2019   Status post cataract extraction and insertion of intraocular lens of left eye 09/04/2019   Lumbar stenosis 08/24/2019   Allergic rhinitis due to pollen 06/06/2019   Persistent cough 06/06/2019   History of Descemet membrane endothelial keratoplasty (DMEK) 01/04/2019   Status post cataract extraction and insertion of intraocular lens of right eye 01/04/2019   Senile nuclear sclerosis 01/02/2019   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   Anatomical narrow angle, 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   Obesity, morbid, BMI 40.0-49.9 ([place]) 04/19/2018   Morbid obesity ([place]) 04/19/2018    Cataract    Fibroid    Endometriosis    DM (diabetes mellitus) type II controlled with renal manifestation ([place]) 06/17/2010   Dyslipidemia 06/17/2010   Microcytic anemia 06/17/2010   Essential hypertension 06/17/2010   Allergic rhinitis 06/17/2010   Osteoarthritis 06/17/2010   LOW BACK PAIN 06/17/2010   OSTEOPENIA 06/17/2010   BREAST CANCER, HX OF 06/17/2010   Disorder of skeletal system 06/17/2010    Claris Pong, PT 03/25/2021, 12:03 PM  [place], [place], [address] Phone: [phone]   Fax:  [phone]  Name: Doris Jackson MRN: [id number] Date of Birth: [birth year]

## 2021-03-27 ENCOUNTER — Other Ambulatory Visit: Payer: Self-pay

## 2021-03-27 ENCOUNTER — Ambulatory Visit: Payer: Medicare HMO

## 2021-03-27 DIAGNOSIS — M25611 Stiffness of right shoulder, not elsewhere classified: Secondary | ICD-10-CM | POA: Diagnosis not present

## 2021-03-27 DIAGNOSIS — I890 Lymphedema, not elsewhere classified: Secondary | ICD-10-CM | POA: Diagnosis not present

## 2021-03-27 DIAGNOSIS — M545 Low back pain, unspecified: Secondary | ICD-10-CM | POA: Diagnosis not present

## 2021-03-27 DIAGNOSIS — Z483 Aftercare following surgery for neoplasm: Secondary | ICD-10-CM | POA: Diagnosis not present

## 2021-03-27 DIAGNOSIS — M6281 Muscle weakness (generalized): Secondary | ICD-10-CM

## 2021-03-27 DIAGNOSIS — R262 Difficulty in walking, not elsewhere classified: Secondary | ICD-10-CM | POA: Diagnosis not present

## 2021-03-27 DIAGNOSIS — G8929 Other chronic pain: Secondary | ICD-10-CM | POA: Diagnosis not present

## 2021-03-27 NOTE — Therapy (Signed)
[place], [place], [address] Phone: [phone]   Fax:  [phone]  Physical Therapy Treatment  Patient Details  Name: Doris Jackson MRN: [id number] Date of Birth: 09-22-1940 Referring Provider (PT): Dr. Griffin Basil   Encounter Date: 03/27/2021   PT End of Session - 03/27/21 1202     Visit Number 22    Number of Visits 30    Date for PT Re-Evaluation 04/15/21    PT Start Time 1110    PT Stop Time 1200    PT Time Calculation (min) 50 min    Activity Tolerance Patient tolerated treatment well    Behavior During Therapy [facility] for tasks assessed/performed             Past Medical History:  Diagnosis Date   ALLERGIC RHINITIS 06/17/2010   ANEMIA-NOS 06/17/2010   BREAST CANCER, HX OF 06/17/2010   Breast cancer, right ([place]) 2008   Cataract    COPD 06/17/2010   pt denies   DIABETES MELLITUS, TYPE II 06/17/2010   Endometriosis    Fibroid    FIBROID   GERD (gastroesophageal reflux disease)    HYPERLIPIDEMIA 06/17/2010   HYPERTENSION 06/17/2010   Leg swelling    LOW BACK PAIN 06/17/2010   OSTEOARTHRITIS 06/17/2010   OSTEOPENIA 06/17/2010   Weakness     Past Surgical History:  Procedure Laterality Date   ABDOMINAL HYSTERECTOMY  1988   TAH,LSO   ANTERIOR CERVICAL DECOMP/DISCECTOMY FUSION N/A 09/15/2019   Procedure: Cervical five CorpectomyANTERIOR CERVICAL DECOMPRESSION/DISCECTOMY FUSION CERVICAL THREE-CERVICAL Four;  Surgeon: Ashok Pall, MD;  Location: [place];  Service: Neurosurgery;  Laterality: N/A;   BREAST LUMPECTOMY Right 2007   LUMPECTOMY FOLLOWED BY RADIATION   COLONOSCOPY     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   Subjective Assessment - 03/27/21 1118     Subjective Wrap felt good and it stayed on well    Pertinent History Pt is s/p right breast lumpectomy with radiation in 2007.  She has had some swelling for a number of years, but has never been treated.  She suffered a fall at [facility] in March and fractured her righ shoulder resulting in a Right reverse total Shoulder.  After that she noticed more swelling in the right UE and was referred by Dr. Griffin Basil for lymphedema treatment. She is also diabetic and has hypertension. She has LBP, prior cervical fusion, OA. She is using a rollator    Patient Stated Goals Decrease swelling in arm, gain information from provider    Currently in Pain? No/denies    Multiple Pain Sites No                   LYMPHEDEMA/ONCOLOGY QUESTIONNAIRE - 03/27/21 0001       Right Upper Extremity Lymphedema   15 cm Proximal to Olecranon Process 41 cm    10  cm Proximal to Olecranon Process 44.5 cm    Olecranon Process 28.6 cm    15 cm Proximal to Ulnar Styloid Process 27.4 cm    10 cm Proximal to Ulnar Styloid Process 25.9 cm    Just Proximal to Ulnar Styloid Process 19.7 cm    Across Hand at [employer] 19.6 cm    At [place] of 2nd Digit 6.2 cm                        [facility] Adult PT Treatment/Exercise - 03/27/21 0001       Manual Therapy   Manual therapy comments wraps removed and arm washed. Lotion applied    Manual Lymphatic Drainage (MLD) In supine with head of bed elevated: short neck, 5 diaphragmatic breaths, left axillary LN's, right inguinal LN's, anterior interaxillary pathway, right axillo inguinal pathway and right UE starting proximally and working distally,  retracing all steps.    Compression Bandaging Large TG soft turned inside out due to skin irritation at upper arm, and  medium at lower arm.  10 cm soft comprilan foam applied wrist to axilla.  fingers 1-4 wrapped with elastomull, 6 cm to hand and wrist, 10 cm wrist to mid upper arm, and 12 cm  from mid forearm to axilla. 12 cm wrist to axilla. TG soft pulled down over top of bandage.                          PT Long Term Goals - 02/27/21 1220       PT LONG TERM GOAL #1   Title Pt will have decreased edema  at 15 cm prox to ulna and olecranon by 2-4 cm    Baseline 1.5 cm decrease 15 cm prox to ulna, 5.6 cm decrease 15 cm prox to olecranon    Time 6    Period Weeks    Status Partially Met      PT LONG TERM GOAL #2   Title Pts husband will be independent with compression bandaging to help reduce right UE swelling.    Time 6    Period Weeks    Status On-going      PT LONG TERM GOAL #3   Title Pt will be fit for appropiate compression garments and will be independent with husbands help in their use    Time 6    Period Weeks    Status On-going      PT LONG TERM GOAL #4   Title Pt will be educated in skin care, and precautions for lymphedema    Status On-going                   Plan - 03/27/21 1217     Clinical Impression Statement Pt was remeasured with arm remaining fairly stable with some areas increased slightly and some decreased slightly.  Wraps are remaining intact and pt is undergoing therapy now to maintain edema reduction until she gets her new sleeve.    Personal Factors and Comorbidities Age;Comorbidity 3+    Comorbidities Right lumpectomy with SLNB and radiation, Recent Reverse right TSA, Cervical Fusion, LBP, OA,hypertension    Examination-Activity Limitations Lift;Reach Overhead;Locomotion Level    Examination-Participation Restrictions Cleaning    Stability/Clinical Decision Making Stable/Uncomplicated    Rehab Potential Good    PT  Frequency 3x / week    PT Duration 4 weeks    PT Treatment/Interventions ADLs/Self Care Home  Management;Therapeutic exercise;Manual techniques;Orthotic Fit/Training;Patient/family education;Manual lymph drainage;Compression bandaging;Scar mobilization;Passive range of motion    PT Next Visit Plan MLD, compression bandaging, show pt night garment options while awaiting garments/flexitouch info    Consulted and Agree with Plan of Care Patient             Patient will benefit from skilled therapeutic intervention in order to improve the following deficits and impairments:  Decreased range of motion, Decreased scar mobility, Decreased strength, Increased edema, Postural dysfunction, Impaired UE functional use  Visit Diagnosis: Lymphedema, not elsewhere classified  Stiffness of right shoulder, not elsewhere classified  Muscle weakness (generalized)  Aftercare following surgery for neoplasm     Problem List Patient Active Problem List   Diagnosis Date Noted   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   Allergic rhinitis due to animal (cat) (dog) hair and dander 07/18/2020   Leiomyoma 07/18/2020   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   Anxiety state    Acute blood loss anemia    Postoperative pain    Cervical myelopathy ([facility]) 09/22/2019   S/P cervical spinal fusion 09/22/2019   Cervical spondylosis with myelopathy and radiculopathy 09/15/2019   AKI (acute kidney injury) ([place]) 09/14/2019   GERD (gastroesophageal reflux disease) 09/14/2019   Spinal stenosis in cervical region 09/14/2019   Weakness 09/14/2019   Numbness and tingling 09/14/2019   Cervical spinal stenosis 09/14/2019   Status post cataract extraction and insertion of intraocular lens of left eye 09/04/2019   Lumbar stenosis 08/24/2019   Allergic rhinitis due to pollen 06/06/2019   Persistent cough 06/06/2019   History of Descemet membrane endothelial keratoplasty  (DMEK) 01/04/2019   Status post cataract extraction and insertion of intraocular lens of right eye 01/04/2019   Senile nuclear sclerosis 01/02/2019   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   Anatomical narrow angle, 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   Obesity, morbid, BMI 40.0-49.9 ([place]) 04/19/2018   Morbid obesity ([place]) 04/19/2018   Cataract    Fibroid    Endometriosis    DM (diabetes mellitus) type II controlled with renal manifestation ([place]) 06/17/2010   Dyslipidemia 06/17/2010   Microcytic anemia 06/17/2010   Essential hypertension 06/17/2010   Allergic rhinitis 06/17/2010   Osteoarthritis 06/17/2010   LOW BACK PAIN 06/17/2010   OSTEOPENIA 06/17/2010   BREAST CANCER, HX OF 06/17/2010   Disorder of skeletal system 06/17/2010    Claris Pong, PT 03/27/2021, 12:19 PM  [place], [place], [address] Phone: [phone]   Fax:  [phone]  Name: YOMAYRA TATE MRN: [id number] Date of Birth: 10/09/1940

## 2021-03-28 ENCOUNTER — Telehealth: Payer: Medicare HMO

## 2021-03-29 DIAGNOSIS — M4802 Spinal stenosis, cervical region: Secondary | ICD-10-CM | POA: Diagnosis not present

## 2021-04-02 ENCOUNTER — Ambulatory Visit: Payer: Medicare HMO | Attending: Orthopaedic Surgery | Admitting: Rehabilitation

## 2021-04-02 ENCOUNTER — Other Ambulatory Visit: Payer: Self-pay

## 2021-04-02 ENCOUNTER — Encounter: Payer: Self-pay | Admitting: Rehabilitation

## 2021-04-02 DIAGNOSIS — M25611 Stiffness of right shoulder, not elsewhere classified: Secondary | ICD-10-CM | POA: Diagnosis not present

## 2021-04-02 DIAGNOSIS — M545 Low back pain, unspecified: Secondary | ICD-10-CM | POA: Insufficient documentation

## 2021-04-02 DIAGNOSIS — Z483 Aftercare following surgery for neoplasm: Secondary | ICD-10-CM | POA: Diagnosis not present

## 2021-04-02 DIAGNOSIS — I890 Lymphedema, not elsewhere classified: Secondary | ICD-10-CM | POA: Insufficient documentation

## 2021-04-02 DIAGNOSIS — M6281 Muscle weakness (generalized): Secondary | ICD-10-CM | POA: Insufficient documentation

## 2021-04-02 DIAGNOSIS — G8929 Other chronic pain: Secondary | ICD-10-CM | POA: Insufficient documentation

## 2021-04-02 NOTE — Therapy (Signed)
[facility] @ [place], [place], [address] Phone:     Fax:     Physical Therapy Treatment  Patient Details  Name: Doris Jackson MRN: [id number] Date of Birth: 1941/04/28 Referring Provider (PT): Dr. Griffin Basil   Encounter Date: 04/02/2021   PT End of Session - 04/02/21 1301     Visit Number 23    Number of Visits 30    Date for PT Re-Evaluation 04/15/21    PT Start Time 1100    PT Stop Time 9417    PT Time Calculation (min) 55 min    Activity Tolerance Patient tolerated treatment well    Behavior During Therapy [facility] for tasks assessed/performed             Past Medical History:  Diagnosis Date   ALLERGIC RHINITIS 06/17/2010   ANEMIA-NOS 06/17/2010   BREAST CANCER, HX OF 06/17/2010   Breast cancer, right ([place]) 2008   Cataract    COPD 06/17/2010   pt denies   DIABETES MELLITUS, TYPE II 06/17/2010   Endometriosis    Fibroid    FIBROID   GERD (gastroesophageal reflux disease)    HYPERLIPIDEMIA 06/17/2010   HYPERTENSION 06/17/2010   Leg swelling    LOW BACK PAIN 06/17/2010   OSTEOARTHRITIS 06/17/2010   OSTEOPENIA 06/17/2010   Weakness     Past Surgical History:  Procedure Laterality Date   ABDOMINAL HYSTERECTOMY  1988   TAH,LSO   ANTERIOR CERVICAL DECOMP/DISCECTOMY FUSION N/A 09/15/2019   Procedure: Cervical five CorpectomyANTERIOR CERVICAL DECOMPRESSION/DISCECTOMY FUSION CERVICAL THREE-CERVICAL Four;  Surgeon: Ashok Pall, MD;  Location: [facility];  Service: Neurosurgery;  Laterality: N/A;   BREAST LUMPECTOMY Right 2007   LUMPECTOMY FOLLOWED BY RADIATION   COLONOSCOPY     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   Subjective Assessment - 04/02/21 1259     Subjective I've had the wrap on for the past 6 days    Pertinent History Pt is s/p right breast lumpectomy with radiation in 2007.  She has had some swelling for a number of years, but has never been treated.  She suffered a fall at [facility] in March and fractured her righ shoulder resulting in a Right reverse total Shoulder.  After that she noticed more swelling in the right UE and was referred by Dr. Griffin Basil for lymphedema treatment. She is also diabetic and has hypertension. She has LBP, prior cervical fusion, OA. She is using a rollator    Patient Stated Goals Decrease swelling in arm, gain information from provider    Currently in Pain? No/denies                               [facility] Adult PT Treatment/Exercise - 04/02/21 0001       Manual Therapy  Manual therapy comments wraps removed and arm washed. Lotion applied    Manual Lymphatic Drainage (MLD) In supine with head of bed elevated: short neck, 5 diaphragmatic breaths, left axillary LN's, right inguinal LN's, anterior interaxillary pathway, right axillo inguinal pathway and right UE starting proximally and working distally, retracing all steps.    Compression Bandaging Large TG soft turned inside out due to skin irritation at upper arm, and  medium at lower arm.  10 cm soft comprilan foam applied wrist to axilla.  fingers 1-4 wrapped with elastomull, 6 cm to hand and wrist, 10 cm wrist to mid upper arm, and 12 cm  from mid forearm to axilla. 12 cm wrist to axilla. TG soft pulled down over top of bandage.                          PT Long Term Goals - 02/27/21 1220       PT LONG TERM GOAL #1   Title Pt  will have decreased edema  at 15 cm prox to ulna and olecranon by 2-4 cm    Baseline 1.5 cm decrease 15 cm prox to ulna, 5.6 cm decrease 15 cm prox to olecranon    Time 6    Period Weeks    Status Partially Met      PT LONG TERM GOAL #2   Title Pts husband will be independent with compression bandaging to help reduce right UE swelling.    Time 6    Period Weeks    Status On-going      PT LONG TERM GOAL #3   Title Pt will be fit for appropiate compression garments and will be independent with husbands help in their use    Time 6    Period Weeks    Status On-going      PT LONG TERM GOAL #4   Title Pt will be educated in skin care, and precautions for lymphedema    Status On-going                   Plan - 04/02/21 1302     Clinical Impression Statement Pt is doing well.  Was able to keep her bandage on x 6 days with skin still looking good.  Continued CDT.    PT Frequency 3x / week    PT Duration 4 weeks    PT Treatment/Interventions ADLs/Self Care Home Management;Therapeutic exercise;Manual techniques;Orthotic Fit/Training;Patient/family education;Manual lymph drainage;Compression bandaging;Scar mobilization;Passive range of motion    PT Next Visit Plan MLD, compression bandaging, show pt night garment options while awaiting garments/flexitouch info    Consulted and Agree with Plan of Care Patient             Patient will benefit from skilled therapeutic intervention in order to improve the following deficits and impairments:     Visit Diagnosis: Lymphedema, not elsewhere classified  Stiffness of right shoulder, not elsewhere classified  Muscle weakness (generalized)  Aftercare following surgery for neoplasm     Problem List Patient Active Problem List   Diagnosis Date Noted   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   Allergic rhinitis due to animal (cat) (dog) hair and dander 07/18/2020   Leiomyoma 07/18/2020   Hardware  failure of anterior column of spine ([place]) 10/21/2019   Anxiety state    Acute blood loss anemia    Postoperative pain    Cervical myelopathy ([place]) 09/22/2019   S/P cervical  spinal fusion 09/22/2019   Cervical spondylosis with myelopathy and radiculopathy 09/15/2019   AKI (acute kidney injury) ([place]) 09/14/2019   GERD (gastroesophageal reflux disease) 09/14/2019   Spinal stenosis in cervical region 09/14/2019   Weakness 09/14/2019   Numbness and tingling 09/14/2019   Cervical spinal stenosis 09/14/2019   Status post cataract extraction and insertion of intraocular lens of left eye 09/04/2019   Lumbar stenosis 08/24/2019   Allergic rhinitis due to pollen 06/06/2019   Persistent cough 06/06/2019   History of Descemet membrane endothelial keratoplasty (DMEK) 01/04/2019   Status post cataract extraction and insertion of intraocular lens of right eye 01/04/2019   Senile nuclear sclerosis 01/02/2019   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   Anatomical narrow angle, 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   Obesity, morbid, BMI 40.0-49.9 ([place]) 04/19/2018   Morbid obesity ([place]) 04/19/2018   Cataract    Fibroid    Endometriosis    DM (diabetes mellitus) type II controlled with renal manifestation ([place]) 06/17/2010   Dyslipidemia 06/17/2010   Microcytic anemia 06/17/2010   Essential hypertension 06/17/2010   Allergic rhinitis 06/17/2010   Osteoarthritis 06/17/2010   LOW BACK PAIN 06/17/2010   OSTEOPENIA 06/17/2010   BREAST CANCER, HX OF 06/17/2010   Disorder of skeletal system 06/17/2010    Stark Bray, PT 04/02/2021, 1:05 PM  [facility] @ [place], [place], [address] Phone:     Fax:     Name: ESTA CARMON MRN: [id number] Date of Birth: 09-28-1940

## 2021-04-04 ENCOUNTER — Ambulatory Visit: Payer: Medicare HMO

## 2021-04-04 ENCOUNTER — Other Ambulatory Visit: Payer: Self-pay

## 2021-04-04 DIAGNOSIS — M6281 Muscle weakness (generalized): Secondary | ICD-10-CM

## 2021-04-04 DIAGNOSIS — I890 Lymphedema, not elsewhere classified: Secondary | ICD-10-CM

## 2021-04-04 DIAGNOSIS — Z483 Aftercare following surgery for neoplasm: Secondary | ICD-10-CM

## 2021-04-04 DIAGNOSIS — M25611 Stiffness of right shoulder, not elsewhere classified: Secondary | ICD-10-CM

## 2021-04-04 DIAGNOSIS — G8929 Other chronic pain: Secondary | ICD-10-CM | POA: Diagnosis not present

## 2021-04-04 DIAGNOSIS — M545 Low back pain, unspecified: Secondary | ICD-10-CM | POA: Diagnosis not present

## 2021-04-04 NOTE — Therapy (Signed)
[facility] @ [place], [place], [address] Phone:     Fax:     Physical Therapy Treatment  Patient Details  Name: Doris Jackson MRN: [id number] Date of Birth: 13-Aug-1940 Referring Provider (PT): Dr. Griffin Basil   Encounter Date: 04/04/2021   PT End of Session - 04/04/21 1101     Visit Number 24    Number of Visits 30    Date for PT Re-Evaluation 04/15/21    PT Start Time 1012    PT Stop Time 1058    PT Time Calculation (min) 46 min    Activity Tolerance Patient tolerated treatment well    Behavior During Therapy [facility] for tasks assessed/performed             Past Medical History:  Diagnosis Date   ALLERGIC RHINITIS 06/17/2010   ANEMIA-NOS 06/17/2010   BREAST CANCER, HX OF 06/17/2010   Breast cancer, right ([place]) 2008   Cataract    COPD 06/17/2010   pt denies   DIABETES MELLITUS, TYPE II 06/17/2010   Endometriosis    Fibroid    FIBROID   GERD (gastroesophageal reflux disease)    HYPERLIPIDEMIA 06/17/2010   HYPERTENSION 06/17/2010   Leg swelling    LOW BACK PAIN 06/17/2010   OSTEOARTHRITIS 06/17/2010   OSTEOPENIA 06/17/2010   Weakness     Past Surgical History:  Procedure Laterality Date   ABDOMINAL HYSTERECTOMY  1988   TAH,LSO   ANTERIOR CERVICAL DECOMP/DISCECTOMY FUSION N/A 09/15/2019   Procedure: Cervical five CorpectomyANTERIOR CERVICAL DECOMPRESSION/DISCECTOMY FUSION CERVICAL THREE-CERVICAL Four;  Surgeon: Ashok Pall, MD;  Location: [facility];  Service: Neurosurgery;  Laterality: N/A;   BREAST LUMPECTOMY Right 2007   LUMPECTOMY FOLLOWED BY RADIATION   COLONOSCOPY     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   Subjective Assessment - 04/04/21 1015     Subjective I saw [place] on Tuesday. the wrap stayed on well.    Pertinent History Pt is s/p right breast lumpectomy with radiation in 2007.  She has had some swelling for a number of years, but has never been treated.  She suffered a [facility] and fractured her righ shoulder resulting in a Right reverse total Shoulder.  After that she noticed more swelling in the right UE and was referred by Dr. Griffin Basil for lymphedema treatment. She is also diabetic and has hypertension. She has LBP, prior cervical fusion, OA. She is using a rollator    Patient Stated Goals Decrease swelling in arm, gain information from provider    Currently in Pain? No/denies    Multiple Pain Sites No                               [facility] Adult PT Treatment/Exercise - 04/04/21 0001  Manual Therapy   Manual therapy comments wraps removed and arm washed. Lotion applied    Manual Lymphatic Drainage (MLD) In supine with head of bed elevated: short neck, 5 diaphragmatic breaths, left axillary LN's, right inguinal LN's, anterior interaxillary pathway, right axillo inguinal pathway and right UE starting proximally and working distally, retracing all steps.    Compression Bandaging Large TG soft turned inside out due to skin irritation at upper arm, and  medium at lower arm.  10 cm soft comprilan foam applied wrist to axilla.  fingers 1-4 wrapped with elastomull, 6 cm to hand and wrist, 10 cm wrist to mid upper arm, and 12 cm  from mid forearm to axilla. 12 cm wrist to axilla. TG soft pulled down over top of bandage.                          PT Long Term Goals - 02/27/21 1220        PT LONG TERM GOAL #1   Title Pt will have decreased edema  at 15 cm prox to ulna and olecranon by 2-4 cm    Baseline 1.5 cm decrease 15 cm prox to ulna, 5.6 cm decrease 15 cm prox to olecranon    Time 6    Period Weeks    Status Partially Met      PT LONG TERM GOAL #2   Title Pts husband will be independent with compression bandaging to help reduce right UE swelling.    Time 6    Period Weeks    Status On-going      PT LONG TERM GOAL #3   Title Pt will be fit for appropiate compression garments and will be independent with husbands help in their use    Time 6    Period Weeks    Status On-going      PT LONG TERM GOAL #4   Title Pt will be educated in skin care, and precautions for lymphedema    Status On-going                   Plan - 04/04/21 1102     Clinical Impression Statement Pt continues to do well.  We are still awaiting her custom sleeve and glove and pt is unable to wrap herself.  She is very compliant and stays in her wraps as instructed    Personal Factors and Comorbidities Age;Comorbidity 3+    Comorbidities Right lumpectomy with SLNB and radiation, Recent Reverse right TSA, Cervical Fusion, LBP, OA,hypertension    Examination-Activity Limitations Lift;Reach Overhead;Locomotion Level    Stability/Clinical Decision Making Stable/Uncomplicated    Rehab Potential Good    PT Frequency 3x / week    PT Duration 4 weeks    PT Treatment/Interventions ADLs/Self Care Home Management;Therapeutic exercise;Manual techniques;Orthotic Fit/Training;Patient/family education;Manual lymph drainage;Compression bandaging;Scar mobilization;Passive range of motion    PT Next Visit Plan MLD, compression bandaging, show pt night garment options while awaiting garments/flexitouch info    Consulted and Agree with Plan of Care Patient             Patient will benefit from skilled therapeutic intervention in order to improve the following deficits and impairments:  Decreased  range of motion, Decreased scar mobility, Decreased strength, Increased edema, Postural dysfunction, Impaired UE functional use  Visit Diagnosis: Lymphedema, not elsewhere classified  Stiffness of right shoulder, not elsewhere classified  Muscle weakness (generalized)  Aftercare following surgery for neoplasm  Problem List Patient Active Problem List   Diagnosis Date Noted   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   Allergic rhinitis due to animal (cat) (dog) hair and dander 07/18/2020   Leiomyoma 07/18/2020   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   Anxiety state    Acute blood loss anemia    Postoperative pain    Cervical myelopathy ([place]) 09/22/2019   S/P cervical spinal fusion 09/22/2019   Cervical spondylosis with myelopathy and radiculopathy 09/15/2019   AKI (acute kidney injury) ([place]) 09/14/2019   GERD (gastroesophageal reflux disease) 09/14/2019   Spinal stenosis in cervical region 09/14/2019   Weakness 09/14/2019   Numbness and tingling 09/14/2019   Cervical spinal stenosis 09/14/2019   Status post cataract extraction and insertion of intraocular lens of left eye 09/04/2019   Lumbar stenosis 08/24/2019   Allergic rhinitis due to pollen 06/06/2019   Persistent cough 06/06/2019   History of Descemet membrane endothelial keratoplasty (DMEK) 01/04/2019   Status post cataract extraction and insertion of intraocular lens of right eye 01/04/2019   Senile nuclear sclerosis 01/02/2019   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   Anatomical narrow angle, 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   Obesity, morbid, BMI 40.0-49.9 ([place]) 04/19/2018   Morbid obesity ([place]) 04/19/2018   Cataract    Fibroid    Endometriosis    DM (diabetes mellitus) type II controlled with renal manifestation ([place]) 06/17/2010   Dyslipidemia 06/17/2010   Microcytic anemia 06/17/2010   Essential hypertension 06/17/2010   Allergic rhinitis 06/17/2010   Osteoarthritis  06/17/2010   LOW BACK PAIN 06/17/2010   OSTEOPENIA 06/17/2010   BREAST CANCER, HX OF 06/17/2010   Disorder of skeletal system 06/17/2010    Claris Pong, PT 04/04/2021, 12:14 PM  [facility] @ [place], [place], [address] Phone:     Fax:     Name: Doris Jackson MRN: [id number] Date of Birth: May 24, 1941

## 2021-04-05 ENCOUNTER — Other Ambulatory Visit: Payer: Self-pay | Admitting: Family Medicine

## 2021-04-10 ENCOUNTER — Other Ambulatory Visit: Payer: Self-pay

## 2021-04-10 ENCOUNTER — Ambulatory Visit: Payer: Medicare HMO

## 2021-04-10 ENCOUNTER — Telehealth: Payer: Self-pay

## 2021-04-10 DIAGNOSIS — M545 Low back pain, unspecified: Secondary | ICD-10-CM | POA: Diagnosis not present

## 2021-04-10 DIAGNOSIS — Z483 Aftercare following surgery for neoplasm: Secondary | ICD-10-CM

## 2021-04-10 DIAGNOSIS — G8929 Other chronic pain: Secondary | ICD-10-CM | POA: Diagnosis not present

## 2021-04-10 DIAGNOSIS — M6281 Muscle weakness (generalized): Secondary | ICD-10-CM

## 2021-04-10 DIAGNOSIS — M25611 Stiffness of right shoulder, not elsewhere classified: Secondary | ICD-10-CM | POA: Diagnosis not present

## 2021-04-10 DIAGNOSIS — I890 Lymphedema, not elsewhere classified: Secondary | ICD-10-CM | POA: Diagnosis not present

## 2021-04-10 NOTE — Telephone Encounter (Signed)
Pt states she has refilled this medication this past weekend. Also states that pharmacy should be changed to the [facility] on [address]; will make change in record.  Pt also due appt with PCP. Appt made & pt verb understanding.

## 2021-04-10 NOTE — Therapy (Signed)
[facility] @ [place], [place], [address] Phone: [phone]   Fax:  [phone]  Physical Therapy Treatment  Patient Details  Name: Doris Jackson MRN: [id number] Date of Birth: 1940-10-27 Referring Provider (PT): Dr. Griffin Basil   Encounter Date: 04/10/2021   PT End of Session - 04/10/21 1355     Visit Number 25    Number of Visits 30    Date for PT Re-Evaluation 04/15/21    PT Start Time 1301    PT Stop Time 1350    PT Time Calculation (min) 49 min    Activity Tolerance Patient tolerated treatment well    Behavior During Therapy [facility] for tasks assessed/performed             Past Medical History:  Diagnosis Date   ALLERGIC RHINITIS 06/17/2010   ANEMIA-NOS 06/17/2010   BREAST CANCER, HX OF 06/17/2010   Breast cancer, right ([place]) 2008   Cataract    COPD 06/17/2010   pt denies   DIABETES MELLITUS, TYPE II 06/17/2010   Endometriosis    Fibroid    FIBROID   GERD (gastroesophageal reflux disease)    HYPERLIPIDEMIA 06/17/2010   HYPERTENSION 06/17/2010   Leg swelling    LOW BACK PAIN 06/17/2010   OSTEOARTHRITIS 06/17/2010   OSTEOPENIA 06/17/2010   Weakness     Past Surgical History:  Procedure Laterality Date   ABDOMINAL HYSTERECTOMY  1988   TAH,LSO   ANTERIOR CERVICAL DECOMP/DISCECTOMY FUSION N/A 09/15/2019   Procedure: Cervical five CorpectomyANTERIOR CERVICAL DECOMPRESSION/DISCECTOMY FUSION CERVICAL THREE-CERVICAL Four;  Surgeon: Ashok Pall, MD;  Location: [place] [facility];  Service: Neurosurgery;  Laterality: N/A;   BREAST LUMPECTOMY Right 2007   LUMPECTOMY FOLLOWED BY RADIATION   COLONOSCOPY     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   Subjective Assessment - 04/10/21 1303     Subjective Wrap stayed on OK. I got the donning gloves and the easy slide on Saturday    Pertinent History Pt is s/p right breast lumpectomy with radiation in 2007.  She has had some swelling for a number of years, but has never been treated.  She suffered a [facility] and fractured her righ shoulder resulting in a Right reverse total Shoulder.  After that she noticed more swelling in the right UE and was referred by Dr. Griffin Basil for lymphedema treatment. She is also diabetic and has hypertension. She has LBP, prior cervical fusion, OA. She is using a rollator    Patient Stated Goals Decrease swelling in arm, gain information from provider    Currently in Pain? No/denies    Multiple Pain Sites No                               [facility] Adult PT Treatment/Exercise - 04/10/21  0001       Manual Therapy   Manual therapy comments wraps removed and arm washed. Lotion applied    Manual Lymphatic Drainage (MLD) In supine with head of bed elevated: short neck, 5 diaphragmatic breaths, left axillary LN's, right inguinal LN's, anterior interaxillary pathway, right axillo inguinal pathway and right UE starting proximally and working distally, retracing all steps.    Compression Bandaging Large TG soft turned inside out due to skin irritation at upper arm, and  medium at lower arm.  10 cm soft comprilan foam applied wrist to axilla.  fingers 1-4 wrapped with elastomull, 6 cm to hand and wrist, 10 cm wrist to mid upper arm, and 12 cm  from mid forearm to axilla. 12 cm wrist to axilla. TG soft pulled down over top of bandage.                           PT Long Term Goals - 02/27/21 1220       PT LONG TERM GOAL #1   Title Pt will have decreased edema  at 15 cm prox to ulna and olecranon by 2-4 cm    Baseline 1.5 cm decrease 15 cm prox to ulna, 5.6 cm decrease 15 cm prox to olecranon    Time 6    Period Weeks    Status Partially Met      PT LONG TERM GOAL #2   Title Pts husband will be independent with compression bandaging to help reduce right UE swelling.    Time 6    Period Weeks    Status On-going      PT LONG TERM GOAL #3   Title Pt will be fit for appropiate compression garments and will be independent with husbands help in their use    Time 6    Period Weeks    Status On-going      PT LONG TERM GOAL #4   Title Pt will be educated in skin care, and precautions for lymphedema    Status On-going                   Plan - 04/10/21 1355     Clinical Impression Statement Pt received her donning gloves and EZ slide in the mail, but has not received her compression sleeve and glove.  Her wrap stayed on well for 5 days, but skin was very dry.  Wrap removed and lotion aplied.  continued MLD to the right arm, and arm was rewrapped.  Emailed [employer] to check on sleeve/glove    Personal Factors and Comorbidities Age;Comorbidity 3+    Comorbidities Right lumpectomy with SLNB and radiation, Recent Reverse right TSA, Cervical Fusion, LBP, OA,hypertension    Examination-Activity Limitations Lift;Reach Overhead;Locomotion Level    Examination-Participation Restrictions Cleaning    Stability/Clinical Decision Making Stable/Uncomplicated    Rehab Potential Good    PT Frequency 3x / week    PT Duration 4 weeks    PT Treatment/Interventions ADLs/Self Care Home Management;Therapeutic exercise;Manual techniques;Orthotic Fit/Training;Patient/family education;Manual lymph drainage;Compression bandaging;Scar mobilization;Passive range of motion    PT Next Visit Plan MLD, compression bandaging, show pt night  garment options while awaiting garments/flexitouch info    Consulted and Agree with Plan of Care Patient             Patient will benefit from skilled therapeutic intervention in order to improve the following deficits and impairments:  Decreased range of motion, Decreased scar mobility, Decreased  strength, Increased edema, Postural dysfunction, Impaired UE functional use  Visit Diagnosis: Lymphedema, not elsewhere classified  Stiffness of right shoulder, not elsewhere classified  Muscle weakness (generalized)  Aftercare following surgery for neoplasm     Problem List Patient Active Problem List   Diagnosis Date Noted   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   Allergic rhinitis due to animal (cat) (dog) hair and dander 07/18/2020   Leiomyoma 07/18/2020   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   Anxiety state    Acute blood loss anemia    Postoperative pain    Cervical myelopathy ([place]) 09/22/2019   S/P cervical spinal fusion 09/22/2019   Cervical spondylosis with myelopathy and radiculopathy 09/15/2019   AKI (acute kidney injury) ([place]) 09/14/2019   GERD (gastroesophageal reflux disease) 09/14/2019   Spinal stenosis in cervical region 09/14/2019   Weakness 09/14/2019   Numbness and tingling 09/14/2019   Cervical spinal stenosis 09/14/2019   Status post cataract extraction and insertion of intraocular lens of left eye 09/04/2019   Lumbar stenosis 08/24/2019   Allergic rhinitis due to pollen 06/06/2019   Persistent cough 06/06/2019   History of Descemet membrane endothelial keratoplasty (DMEK) 01/04/2019   Status post cataract extraction and insertion of intraocular lens of right eye 01/04/2019   Senile nuclear sclerosis 01/02/2019   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   Anatomical narrow angle, 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   Obesity, morbid, BMI 40.0-49.9 ([place]) 04/19/2018   Morbid obesity ([place]) 04/19/2018   Cataract     Fibroid    Endometriosis    DM (diabetes mellitus) type II controlled with renal manifestation ([place]) 06/17/2010   Dyslipidemia 06/17/2010   Microcytic anemia 06/17/2010   Essential hypertension 06/17/2010   Allergic rhinitis 06/17/2010   Osteoarthritis 06/17/2010   LOW BACK PAIN 06/17/2010   OSTEOPENIA 06/17/2010   BREAST CANCER, HX OF 06/17/2010   Disorder of skeletal system 06/17/2010    Claris Pong, PT 04/10/2021, 1:58 PM  [facility] @ [place], [place], [address] Phone: [phone]   Fax:  [phone]  Name: Doris Jackson MRN: [id number] Date of Birth: October 21, 1940

## 2021-04-12 ENCOUNTER — Other Ambulatory Visit: Payer: Self-pay

## 2021-04-12 ENCOUNTER — Ambulatory Visit: Payer: Medicare HMO

## 2021-04-12 DIAGNOSIS — G8929 Other chronic pain: Secondary | ICD-10-CM | POA: Diagnosis not present

## 2021-04-12 DIAGNOSIS — Z483 Aftercare following surgery for neoplasm: Secondary | ICD-10-CM

## 2021-04-12 DIAGNOSIS — M545 Low back pain, unspecified: Secondary | ICD-10-CM | POA: Diagnosis not present

## 2021-04-12 DIAGNOSIS — M25611 Stiffness of right shoulder, not elsewhere classified: Secondary | ICD-10-CM | POA: Diagnosis not present

## 2021-04-12 DIAGNOSIS — M6281 Muscle weakness (generalized): Secondary | ICD-10-CM

## 2021-04-12 DIAGNOSIS — I890 Lymphedema, not elsewhere classified: Secondary | ICD-10-CM

## 2021-04-12 NOTE — Therapy (Addendum)
[facility] @ [place], [place], [address] Phone: [phone]   Fax:  [phone]  Physical Therapy Treatment  Patient Details  Name: Doris Jackson MRN: [id number] Date of Birth: 10-02-1940 Referring Provider (PT): Dr. Griffin Basil   Encounter Date: 04/12/2021   PT End of Session - 04/12/21 1203     Visit Number 26    Number of Visits 30    Date for PT Re-Evaluation 04/15/2021    PT Start Time 1007    PT Stop Time 1100    PT Time Calculation (min) 53 min    Activity Tolerance Patient tolerated treatment well    Behavior During Therapy [facility] for tasks assessed/performed             Past Medical History:  Diagnosis Date   ALLERGIC RHINITIS 06/17/2010   ANEMIA-NOS 06/17/2010   BREAST CANCER, HX OF 06/17/2010   Breast cancer, right ([place]) 2008   Cataract    COPD 06/17/2010   pt denies   DIABETES MELLITUS, TYPE II 06/17/2010   Endometriosis    Fibroid    FIBROID   GERD (gastroesophageal reflux disease)    HYPERLIPIDEMIA 06/17/2010   HYPERTENSION 06/17/2010   Leg swelling    LOW BACK PAIN 06/17/2010   OSTEOARTHRITIS 06/17/2010   OSTEOPENIA 06/17/2010   Weakness     Past Surgical History:  Procedure Laterality Date   ABDOMINAL HYSTERECTOMY  1988   TAH,LSO   ANTERIOR CERVICAL DECOMP/DISCECTOMY FUSION N/A 09/15/2019   Procedure: Cervical five CorpectomyANTERIOR CERVICAL DECOMPRESSION/DISCECTOMY FUSION CERVICAL THREE-CERVICAL Four;  Surgeon: Ashok Pall, MD;  Location: [place];  Service: Neurosurgery;  Laterality: N/A;   BREAST LUMPECTOMY Right 2007   LUMPECTOMY FOLLOWED BY RADIATION   COLONOSCOPY     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   Subjective Assessment - 04/12/21 1202     Subjective Wrap stayed on really well.  Still no glove received yet.    Pertinent History Pt is s/p right breast lumpectomy with radiation in 2007.  She has had some swelling for a number of years, but has never been treated.  She suffered a fall at [facility] in March and fractured her righ shoulder resulting in a Right reverse total Shoulder.  After that she noticed more swelling in the right UE and was referred by Dr. Griffin Basil for lymphedema treatment. She is also diabetic and has hypertension. She has LBP, prior cervical fusion, OA. She is using a rollator    Patient Stated Goals Decrease swelling in arm, gain information from provider    Currently in Pain? No/denies    Multiple Pain Sites No                   LYMPHEDEMA/ONCOLOGY QUESTIONNAIRE - 04/12/21 0001       Right Upper Extremity Lymphedema   15 cm Proximal to Olecranon  Process 39.8 cm    10 cm Proximal to Olecranon Process 43.3 cm    Olecranon Process 28.1 cm    15 cm Proximal to Ulnar Styloid Process 27.8 cm    10 cm Proximal to Ulnar Styloid Process 25.9 cm    Just Proximal to Ulnar Styloid Process 18.6 cm    Across Hand at [employer] 19.6 cm    At [place] of 2nd Digit 6.2 cm                        [facility] Adult PT Treatment/Exercise - 04/12/21 0001       Manual Therapy   Manual therapy comments wraps removed and arm washed. cocobutter applied    Manual Lymphatic Drainage (MLD) In supine with head of bed elevated: short neck, 5 diaphragmatic breaths, left axillary LN's, right inguinal LN's, anterior interaxillary pathway, right axillo inguinal pathway and right UE starting  proximally and working distally, retracing all steps.    Compression Bandaging Large TG soft turned inside out due to skin irritation at upper arm, and  medium at lower arm.  10 cm soft comprilan foam applied wrist to axilla.  fingers 1-4 wrapped with elastomull, 6 cm to hand and wrist, 10 cm wrist to mid upper arm, and 12 cm  from mid forearm to axilla. 12 cm wrist to axilla. TG soft pulled down over top of bandage.                          PT Long Term Goals - 02/27/21 1220       PT LONG TERM GOAL #1   Title Pt will have decreased edema  at 15 cm prox to ulna and olecranon by 2-4 cm    Baseline 1.5 cm decrease 15 cm prox to ulna, 5.6 cm decrease 15 cm prox to olecranon    Time 6    Period Weeks    Status Partially Met      PT LONG TERM GOAL #2   Title Pts husband will be independent with compression bandaging to help reduce right UE swelling.    Time 6    Period Weeks    Status On-going      PT LONG TERM GOAL #3   Title Pt will be fit for appropiate compression garments and will be independent with husbands help in their use    Time 6    Period Weeks    Status On-going      PT LONG TERM GOAL #4   Title Pt will be educated in skin care, and precautions for lymphedema    Status On-going                   Plan - 04/12/21 1204     Clinical Impression Statement Pt was remeasured today with arm remaining relatively stable throughout; slightly up in some places and down in others.  Her skin was very dry today and cocobutter was applied instead of lotion.  She has yet to use her Flexitouch because her arm continues to be wrapped while awaiting her custom sleeve/glove from [place].    Personal Factors and Comorbidities Age;Comorbidity 3+    Comorbidities Right lumpectomy with SLNB and radiation, Recent Reverse right TSA, Cervical Fusion, LBP, OA,hypertension    Examination-Activity Limitations Lift;Reach Overhead;Locomotion Level    Examination-Participation  Restrictions Cleaning    Stability/Clinical Decision Making Stable/Uncomplicated    Rehab Potential  Good    PT Frequency 3x / week    PT Duration 4 weeks    PT Treatment/Interventions ADLs/Self Care Home Management;Therapeutic exercise;Manual techniques;Orthotic Fit/Training;Patient/family education;Manual lymph drainage;Compression bandaging;Scar mobilization;Passive range of motion    PT Next Visit Plan  Recert,MLD, compression bandaging, show pt night garment options while awaiting garments/flexitouch info    Consulted and Agree with Plan of Care Patient             Patient will benefit from skilled therapeutic intervention in order to improve the following deficits and impairments:  Decreased range of motion, Decreased scar mobility, Decreased strength, Increased edema, Postural dysfunction, Impaired UE functional use  Visit Diagnosis: Lymphedema, not elsewhere classified  Stiffness of right shoulder, not elsewhere classified  Muscle weakness (generalized)  Aftercare following surgery for neoplasm     Problem List Patient Active Problem List   Diagnosis Date Noted   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   Allergic rhinitis due to animal (cat) (dog) hair and dander 07/18/2020   Leiomyoma 07/18/2020   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   Anxiety state    Acute blood loss anemia    Postoperative pain    Cervical myelopathy (HCC) 09/22/2019   S/P cervical spinal fusion 09/22/2019   Cervical spondylosis with myelopathy and radiculopathy 09/15/2019   AKI (acute kidney injury) ([place]) 09/14/2019   GERD (gastroesophageal reflux disease) 09/14/2019   Spinal stenosis in cervical region 09/14/2019   Weakness 09/14/2019   Numbness and tingling 09/14/2019   Cervical spinal stenosis 09/14/2019   Status post cataract extraction and insertion of intraocular lens of left eye 09/04/2019   Lumbar stenosis 08/24/2019   Allergic  rhinitis due to pollen 06/06/2019   Persistent cough 06/06/2019   History of Descemet membrane endothelial keratoplasty (DMEK) 01/04/2019   Status post cataract extraction and insertion of intraocular lens of right eye 01/04/2019   Senile nuclear sclerosis 01/02/2019   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   Anatomical narrow angle, 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   Obesity, morbid, BMI 40.0-49.9 ([place]) 04/19/2018   Morbid obesity ([place]) 04/19/2018   Cataract    Fibroid    Endometriosis    DM (diabetes mellitus) type II controlled with renal manifestation ([place]) 06/17/2010   Dyslipidemia 06/17/2010   Microcytic anemia 06/17/2010   Essential hypertension 06/17/2010   Allergic rhinitis 06/17/2010   Osteoarthritis 06/17/2010   LOW BACK PAIN 06/17/2010   OSTEOPENIA 06/17/2010   BREAST CANCER, HX OF 06/17/2010   Disorder of skeletal system 06/17/2010    Claris Pong, PT 04/12/2021, 12:09 PM  [facility] @ [place], [place], [address] Phone: [phone]   Fax:  [phone]  Name: Doris Jackson MRN: [id number] Date of Birth: 1941/02/12

## 2021-04-15 ENCOUNTER — Ambulatory Visit: Payer: Medicare HMO

## 2021-04-15 ENCOUNTER — Other Ambulatory Visit: Payer: Self-pay

## 2021-04-15 DIAGNOSIS — Z483 Aftercare following surgery for neoplasm: Secondary | ICD-10-CM

## 2021-04-15 DIAGNOSIS — M25611 Stiffness of right shoulder, not elsewhere classified: Secondary | ICD-10-CM

## 2021-04-15 DIAGNOSIS — I890 Lymphedema, not elsewhere classified: Secondary | ICD-10-CM

## 2021-04-15 DIAGNOSIS — J3089 Other allergic rhinitis: Secondary | ICD-10-CM | POA: Diagnosis not present

## 2021-04-15 DIAGNOSIS — G8929 Other chronic pain: Secondary | ICD-10-CM | POA: Diagnosis not present

## 2021-04-15 DIAGNOSIS — M6281 Muscle weakness (generalized): Secondary | ICD-10-CM | POA: Diagnosis not present

## 2021-04-15 DIAGNOSIS — J301 Allergic rhinitis due to pollen: Secondary | ICD-10-CM | POA: Diagnosis not present

## 2021-04-15 DIAGNOSIS — M545 Low back pain, unspecified: Secondary | ICD-10-CM | POA: Diagnosis not present

## 2021-04-15 NOTE — Therapy (Signed)
[facility] @ [place], [place], [address] Phone: [phone]   Fax:  [phone]  Physical Therapy Treatment  Patient Details  Name: Doris Jackson MRN: [id number] Date of Birth: 11/08/1940 Referring Provider (PT): Dr. Griffin Basil   Encounter Date: 04/15/2021   PT End of Session - 04/15/21 0918     Visit Number 27    Number of Visits 38    Date for PT Re-Evaluation 05/10/21    PT Start Time 0907    PT Stop Time 4695    PT Time Calculation (min) 47 min    Activity Tolerance Patient tolerated treatment well    Behavior During Therapy [facility] for tasks assessed/performed             Past Medical History:  Diagnosis Date   ALLERGIC RHINITIS 06/17/2010   ANEMIA-NOS 06/17/2010   BREAST CANCER, HX OF 06/17/2010   Breast cancer, right ([place]) 2008   Cataract    COPD 06/17/2010   pt denies   DIABETES MELLITUS, TYPE II 06/17/2010   Endometriosis    Fibroid    FIBROID   GERD (gastroesophageal reflux disease)    HYPERLIPIDEMIA 06/17/2010   HYPERTENSION 06/17/2010   Leg swelling    LOW BACK PAIN 06/17/2010   OSTEOARTHRITIS 06/17/2010   OSTEOPENIA 06/17/2010   Weakness     Past Surgical History:  Procedure Laterality Date   ABDOMINAL HYSTERECTOMY  1988   TAH,LSO   ANTERIOR CERVICAL DECOMP/DISCECTOMY FUSION N/A 09/15/2019   Procedure: Cervical five CorpectomyANTERIOR CERVICAL DECOMPRESSION/DISCECTOMY FUSION CERVICAL THREE-CERVICAL Four;  Surgeon: Ashok Pall, MD;  Location: [place];  Service: Neurosurgery;  Laterality: N/A;   BREAST LUMPECTOMY Right 2007   LUMPECTOMY FOLLOWED BY RADIATION   COLONOSCOPY     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   Subjective Assessment - 04/15/21 0908     Subjective Came by myself because my husband is sick. He had to go to the ED over the weekend. Wrap stayed on well.    Pertinent History Pt is s/p right breast lumpectomy with radiation in 2007.  She has had some swelling for a number of years, but has never been treated.  She suffered a fall at [facility] in March and fractured her righ shoulder resulting in a Right reverse total Shoulder.  After that she noticed more swelling in the right UE and was referred by Dr. Griffin Basil for lymphedema treatment. She is also diabetic and has hypertension. She has LBP, prior cervical fusion, OA. She is using a rollator    Patient Stated Goals Decrease swelling in arm, gain information from provider    Currently in Pain? No/denies    Multiple Pain Sites No  [facility] Adult PT Treatment/Exercise - 04/15/21 0001       Manual Therapy   Manual therapy comments wraps removed and arm washed. cocobutter applied    Manual Lymphatic Drainage (MLD) In supine with head of bed elevated: short neck, 5 diaphragmatic breaths, left axillary LN's, right inguinal LN's, anterior interaxillary pathway, right axillo inguinal pathway and right UE starting proximally and working distally, retracing all steps.    Compression Bandaging Large TG soft turned inside out due to skin irritation at upper arm, and  medium at lower arm.  10 cm soft comprilan foam applied wrist to axilla.  fingers 1-4 wrapped with elastomull, 6 cm to hand and wrist, 10 cm wrist to mid upper arm, and 12 cm  from mid forearm to axilla. 12 cm wrist to axilla. TG soft pulled down over top of bandage.                           PT Long Term Goals - 02/27/21 1220       PT LONG TERM GOAL #1   Title Pt will have decreased edema  at 15 cm prox to ulna and olecranon by 2-4 cm    Baseline 1.5 cm decrease 15 cm prox to ulna, 5.6 cm decrease 15 cm prox to olecranon    Time 6    Period Weeks    Status Partially Met      PT LONG TERM GOAL #2   Title Pts husband will be independent with compression bandaging to help reduce right UE swelling.    Time 6    Period Weeks    Status On-going      PT LONG TERM GOAL #3   Title Pt will be fit for appropiate compression garments and will be independent with husbands help in their use    Time 6    Period Weeks    Status On-going      PT LONG TERM GOAL #4   Title Pt will be educated in skin care, and precautions for lymphedema    Status On-going                   Plan - 04/15/21 0959     Clinical Impression Statement Pt continues to be compliant with wearing her wraps while awaiting her custom sleeve from [place].   Continued manual lymph drainage and compression bandaging.  Continues with dry skin under wraps but skin is soft and pliable.  Used cocobutter again today instead of regular lotion    Personal Factors and Comorbidities Age;Comorbidity 3+    Comorbidities Right lumpectomy with SLNB and radiation, Recent Reverse right TSA, Cervical Fusion, LBP, OA,hypertension    Examination-Activity Limitations Lift;Reach Overhead;Locomotion Level    Examination-Participation Restrictions Cleaning    Stability/Clinical Decision Making Stable/Uncomplicated    Rehab Potential Good    PT Frequency 3x / week    PT Duration 4 weeks    PT Treatment/Interventions ADLs/Self Care Home Management;Therapeutic exercise;Manual techniques;Orthotic Fit/Training;Patient/family education;Manual lymph drainage;Compression bandaging;Scar mobilization;Passive range of motion    PT Next Visit Plan MLD, compression bandaging, show pt night garment  options while awaiting garments/flexitouch info    Consulted and Agree with Plan of Care Patient             Patient will benefit from skilled therapeutic intervention in order to improve the following deficits and impairments:  Decreased range of motion, Decreased scar mobility, Decreased strength, Increased edema, Postural dysfunction,  Impaired UE functional use  Visit Diagnosis: Lymphedema, not elsewhere classified  Stiffness of right shoulder, not elsewhere classified  Muscle weakness (generalized)  Aftercare following surgery for neoplasm     Problem List Patient Active Problem List   Diagnosis Date Noted   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   Allergic rhinitis due to animal (cat) (dog) hair and dander 07/18/2020   Leiomyoma 07/18/2020   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   Anxiety state    Acute blood loss anemia    Postoperative pain    Cervical myelopathy ([place]) 09/22/2019   S/P cervical spinal fusion 09/22/2019   Cervical spondylosis with myelopathy and radiculopathy 09/15/2019   AKI (acute kidney injury) ([place]) 09/14/2019   GERD (gastroesophageal reflux disease) 09/14/2019   Spinal stenosis in cervical region 09/14/2019   Weakness 09/14/2019   Numbness and tingling 09/14/2019   Cervical spinal stenosis 09/14/2019   Status post cataract extraction and insertion of intraocular lens of left eye 09/04/2019   Lumbar stenosis 08/24/2019   Allergic rhinitis due to pollen 06/06/2019   Persistent cough 06/06/2019   History of Descemet membrane endothelial keratoplasty (DMEK) 01/04/2019   Status post cataract extraction and insertion of intraocular lens of right eye 01/04/2019   Senile nuclear sclerosis 01/02/2019   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   Anatomical narrow angle, 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   Obesity, morbid, BMI 40.0-49.9 ([place]) 04/19/2018   Morbid obesity ([place]) 04/19/2018   Cataract    Fibroid     Endometriosis    DM (diabetes mellitus) type II controlled with renal manifestation ([place]) 06/17/2010   Dyslipidemia 06/17/2010   Microcytic anemia 06/17/2010   Essential hypertension 06/17/2010   Allergic rhinitis 06/17/2010   Osteoarthritis 06/17/2010   LOW BACK PAIN 06/17/2010   OSTEOPENIA 06/17/2010   BREAST CANCER, HX OF 06/17/2010   Disorder of skeletal system 06/17/2010    Claris Pong, PT 04/15/2021, 11:00 AM  [facility] @ [place], [place], [address] Phone: [phone]   Fax:  [phone]  Name: ANNIAH GLICK MRN: [id number] Date of Birth: [birth year]

## 2021-04-18 DIAGNOSIS — R059 Cough, unspecified: Secondary | ICD-10-CM | POA: Diagnosis not present

## 2021-04-18 DIAGNOSIS — I890 Lymphedema, not elsewhere classified: Secondary | ICD-10-CM | POA: Diagnosis not present

## 2021-04-18 DIAGNOSIS — J3089 Other allergic rhinitis: Secondary | ICD-10-CM | POA: Diagnosis not present

## 2021-04-18 DIAGNOSIS — K219 Gastro-esophageal reflux disease without esophagitis: Secondary | ICD-10-CM | POA: Diagnosis not present

## 2021-04-19 ENCOUNTER — Encounter: Payer: Self-pay | Admitting: Rehabilitation

## 2021-04-19 ENCOUNTER — Ambulatory Visit: Payer: Medicare HMO | Admitting: Rehabilitation

## 2021-04-19 ENCOUNTER — Other Ambulatory Visit: Payer: Self-pay

## 2021-04-19 DIAGNOSIS — M25611 Stiffness of right shoulder, not elsewhere classified: Secondary | ICD-10-CM

## 2021-04-19 DIAGNOSIS — Z483 Aftercare following surgery for neoplasm: Secondary | ICD-10-CM | POA: Diagnosis not present

## 2021-04-19 DIAGNOSIS — M6281 Muscle weakness (generalized): Secondary | ICD-10-CM | POA: Diagnosis not present

## 2021-04-19 DIAGNOSIS — G8929 Other chronic pain: Secondary | ICD-10-CM

## 2021-04-19 DIAGNOSIS — I890 Lymphedema, not elsewhere classified: Secondary | ICD-10-CM | POA: Diagnosis not present

## 2021-04-19 DIAGNOSIS — M545 Low back pain, unspecified: Secondary | ICD-10-CM | POA: Diagnosis not present

## 2021-04-19 NOTE — Therapy (Signed)
[facility] @ [place], [place], [address] Phone: [phone]   Fax:  [phone]  Physical Therapy Treatment  Patient Details  Name: Doris Jackson MRN: [id number] Date of Birth: 02-Feb-1941 Referring Provider (PT): Dr. Griffin Basil   Encounter Date: 04/19/2021   PT End of Session - 04/19/21 0930     Visit Number 28    Number of Visits 38    Date for PT Re-Evaluation 05/10/21    PT Start Time 0900    PT Stop Time 0929    PT Time Calculation (min) 29 min    Activity Tolerance Patient tolerated treatment well    Behavior During Therapy [facility] for tasks assessed/performed             Past Medical History:  Diagnosis Date   ALLERGIC RHINITIS 06/17/2010   ANEMIA-NOS 06/17/2010   BREAST CANCER, HX OF 06/17/2010   Breast cancer, right ([place]) 2008   Cataract    COPD 06/17/2010   pt denies   DIABETES MELLITUS, TYPE II 06/17/2010   Endometriosis    Fibroid    FIBROID   GERD (gastroesophageal reflux disease)    HYPERLIPIDEMIA 06/17/2010   HYPERTENSION 06/17/2010   Leg swelling    LOW BACK PAIN 06/17/2010   OSTEOARTHRITIS 06/17/2010   OSTEOPENIA 06/17/2010   Weakness     Past Surgical History:  Procedure Laterality Date   ABDOMINAL HYSTERECTOMY  1988   TAH,LSO   ANTERIOR CERVICAL DECOMP/DISCECTOMY FUSION N/A 09/15/2019   Procedure: Cervical five CorpectomyANTERIOR CERVICAL DECOMPRESSION/DISCECTOMY FUSION CERVICAL THREE-CERVICAL Four;  Surgeon: Ashok Pall, MD;  Location: [facility];  Service: Neurosurgery;  Laterality: N/A;   BREAST LUMPECTOMY Right 2007   LUMPECTOMY FOLLOWED BY RADIATION   COLONOSCOPY     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   Subjective Assessment - 04/19/21 0928     Pertinent History Pt is s/p right breast lumpectomy with radiation in 2007.  She has had some swelling for a number of years, but has never been treated.  She suffered a [facility] and fractured her righ shoulder resulting in a Right reverse total Shoulder.  After that she noticed more swelling in the right UE and was referred by Dr. Griffin Basil for lymphedema treatment. She is also diabetic and has hypertension. She has LBP, prior cervical fusion, OA. She is using a rollator    Currently in Pain? No/denies                               [facility] Adult PT Treatment/Exercise - 04/19/21 0001       Manual Therapy   Manual therapy comments pt arrives with all items; instruction on sleeve donning with easy slide, which doesn't fit all the way up ther upper arm due to size but  does help with application.  Instruction on glove use and garment washing and care.  This sleeve fits much better and the glove also seems to fit well.                          PT Long Term Goals - 02/27/21 1220       PT LONG TERM GOAL #1   Title Pt will have decreased edema  at 15 cm prox to ulna and olecranon by 2-4 cm    Baseline 1.5 cm decrease 15 cm prox to ulna, 5.6 cm decrease 15 cm prox to olecranon    Time 6    Period Weeks    Status Partially Met      PT LONG TERM GOAL #2   Title Pts husband will be independent with compression bandaging to help reduce right UE swelling.    Time 6    Period Weeks    Status On-going      PT LONG TERM GOAL #3   Title Pt will be fit for appropiate compression garments and will be independent with husbands help in  their use    Time 6    Period Weeks    Status On-going      PT LONG TERM GOAL #4   Title Pt will be educated in skin care, and precautions for lymphedema    Status On-going                   Plan - 04/19/21 0930     Clinical Impression Statement Pt will attempt ind now with new sleeve and glove, donning aid, and donning gloves.  Sleeve and glove seem to fit well.  Focused on donning and garment care instruction and use.  Pt states her and her husband will "try our best".  Pt will also call flexitouch for set up of device.  Will return in 2 weeks for check in    PT Frequency 3x / week    PT Duration 4 weeks    PT Treatment/Interventions ADLs/Self Care Home Management;Therapeutic exercise;Manual techniques;Orthotic Fit/Training;Patient/family education;Manual lymph drainage;Compression bandaging;Scar mobilization;Passive range of motion    PT Next Visit Plan how is garment and pump? remeasure    Consulted and Agree with Plan of Care Patient             Patient will benefit from skilled therapeutic intervention in order to improve the following deficits and impairments:  Decreased range of motion, Decreased scar mobility, Decreased strength, Increased edema, Postural dysfunction, Impaired UE functional use  Visit Diagnosis: Lymphedema, not elsewhere classified  Stiffness of right shoulder, not elsewhere classified  Muscle weakness (generalized)  Aftercare following surgery for neoplasm  Chronic low back pain, unspecified back pain laterality, unspecified whether sciatica present     Problem List Patient Active Problem List   Diagnosis Date Noted   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   Allergic rhinitis due to animal (cat) (dog) hair and dander 07/18/2020   Leiomyoma 07/18/2020   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   Anxiety state    Acute blood loss anemia    Postoperative pain    Cervical myelopathy  (HCC) 09/22/2019   S/P cervical spinal fusion 09/22/2019   Cervical spondylosis with myelopathy and radiculopathy 09/15/2019   AKI (acute kidney injury) ([place]) 09/14/2019   GERD (gastroesophageal reflux disease) 09/14/2019   Spinal stenosis in cervical region 09/14/2019  Weakness 09/14/2019   Numbness and tingling 09/14/2019   Cervical spinal stenosis 09/14/2019   Status post cataract extraction and insertion of intraocular lens of left eye 09/04/2019   Lumbar stenosis 08/24/2019   Allergic rhinitis due to pollen 06/06/2019   Persistent cough 06/06/2019   History of Descemet membrane endothelial keratoplasty (DMEK) 01/04/2019   Status post cataract extraction and insertion of intraocular lens of right eye 01/04/2019   Senile nuclear sclerosis 01/02/2019   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   Anatomical narrow angle, 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   Obesity, morbid, BMI 40.0-49.9 ([place]) 04/19/2018   Morbid obesity ([place]) 04/19/2018   Cataract    Fibroid    Endometriosis    DM (diabetes mellitus) type II controlled with renal manifestation ([place]) 06/17/2010   Dyslipidemia 06/17/2010   Microcytic anemia 06/17/2010   Essential hypertension 06/17/2010   Allergic rhinitis 06/17/2010   Osteoarthritis 06/17/2010   LOW BACK PAIN 06/17/2010   OSTEOPENIA 06/17/2010   BREAST CANCER, HX OF 06/17/2010   Disorder of skeletal system 06/17/2010    Stark Bray, PT 04/19/2021, 9:33 AM  [facility] @ [place], [place], [address] Phone: [phone]   Fax:  [phone]  Name: HEATHER MCKENDREE MRN: [id number] Date of Birth: 12/09/1940

## 2021-04-21 ENCOUNTER — Other Ambulatory Visit: Payer: Self-pay | Admitting: Family Medicine

## 2021-04-23 ENCOUNTER — Other Ambulatory Visit: Payer: Self-pay | Admitting: *Deleted

## 2021-04-23 MED ORDER — LANCETS MISC: 3 refills | Status: AC

## 2021-04-23 NOTE — Telephone Encounter (Signed)
Rx done. 

## 2021-04-24 ENCOUNTER — Ambulatory Visit: Payer: Medicare HMO | Admitting: Family Medicine

## 2021-04-25 ENCOUNTER — Other Ambulatory Visit: Payer: Self-pay | Admitting: Family Medicine

## 2021-04-29 ENCOUNTER — Ambulatory Visit: Payer: Medicare HMO

## 2021-04-29 DIAGNOSIS — M4802 Spinal stenosis, cervical region: Secondary | ICD-10-CM | POA: Diagnosis not present

## 2021-05-06 ENCOUNTER — Ambulatory Visit: Payer: Medicare HMO | Attending: Orthopaedic Surgery

## 2021-05-06 ENCOUNTER — Other Ambulatory Visit: Payer: Self-pay | Admitting: Family Medicine

## 2021-05-06 ENCOUNTER — Ambulatory Visit: Payer: Medicare HMO | Admitting: Family Medicine

## 2021-05-06 ENCOUNTER — Other Ambulatory Visit: Payer: Self-pay

## 2021-05-06 DIAGNOSIS — J3089 Other allergic rhinitis: Secondary | ICD-10-CM | POA: Diagnosis not present

## 2021-05-06 DIAGNOSIS — M25611 Stiffness of right shoulder, not elsewhere classified: Secondary | ICD-10-CM | POA: Insufficient documentation

## 2021-05-06 DIAGNOSIS — J301 Allergic rhinitis due to pollen: Secondary | ICD-10-CM | POA: Diagnosis not present

## 2021-05-06 DIAGNOSIS — Z483 Aftercare following surgery for neoplasm: Secondary | ICD-10-CM | POA: Diagnosis not present

## 2021-05-06 DIAGNOSIS — M6281 Muscle weakness (generalized): Secondary | ICD-10-CM | POA: Insufficient documentation

## 2021-05-06 DIAGNOSIS — I890 Lymphedema, not elsewhere classified: Secondary | ICD-10-CM | POA: Insufficient documentation

## 2021-05-06 NOTE — Patient Instructions (Signed)
Launder Sleeve/glove every 2 days so it will hold your swelling:  Do not fold the wrist or upper arm piece because it gives too much pressure.  Use your EZ slide to apply sleeve to arm and tuck in the tissue around the top. And donning gloves  It STAYS: glue to use on skin to prevent sleeve from rolling down.  Purchase at [facility] [employer] [address], [place], [place]  Use Flexi touch daily x 1 hour to keep swelling down.

## 2021-05-06 NOTE — Therapy (Signed)
[facility] [place], [place], [address] Phone: [phone]   Fax:  [phone]  Physical Therapy Treatment  Patient Details  Name: Doris Jackson MRN: [id number] Date of Birth: [birth year] Referring Provider (PT): Dr. Griffin Basil   Encounter Date: 05/06/2021   PT End of Session - 05/06/21 1351     Visit Number 29    Number of Visits 31    Date for PT Re-Evaluation 07/29/21    PT Start Time 6759    PT Stop Time 1346    PT Time Calculation (min) 42 min    Activity Tolerance Patient tolerated treatment well    Behavior During Therapy [facility] for tasks assessed/performed             Past Medical History:  Diagnosis Date   ALLERGIC RHINITIS 06/17/2010   ANEMIA-NOS 06/17/2010   BREAST CANCER, HX OF 06/17/2010   Breast cancer, right ([place]) 2008   Cataract    COPD 06/17/2010   pt denies   DIABETES MELLITUS, TYPE II 06/17/2010   Endometriosis    Fibroid    FIBROID   GERD (gastroesophageal reflux disease)    HYPERLIPIDEMIA 06/17/2010   HYPERTENSION 06/17/2010   Leg swelling    LOW BACK PAIN 06/17/2010   OSTEOARTHRITIS 06/17/2010   OSTEOPENIA 06/17/2010   Weakness     Past Surgical History:  Procedure Laterality Date   ABDOMINAL HYSTERECTOMY  1988   TAH,LSO   ANTERIOR CERVICAL DECOMP/DISCECTOMY FUSION N/A 09/15/2019   Procedure: Cervical five CorpectomyANTERIOR CERVICAL DECOMPRESSION/DISCECTOMY FUSION CERVICAL THREE-CERVICAL Four;  Surgeon: Ashok Pall, MD;  Location: [facility];  Service: Neurosurgery;  Laterality: N/A;   BREAST LUMPECTOMY Right 2007   LUMPECTOMY FOLLOWED BY RADIATION   COLONOSCOPY     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   Subjective Assessment - 05/06/21 1304     Subjective The flexi touch people havent come to set me up yet, but she comes tomorrow between 10 and 11. I have been having trouble getting the sleeve on by myself.  We folded the wrist today because we couldn't get the sleeve up all the way.    Pertinent History Pt is s/p right breast lumpectomy with radiation in 2007.  She has had some swelling for a number of years, but has never been treated.  She suffered a fall at [facility] in March and fractured her righ shoulder resulting in a Right reverse total Shoulder.  After that she noticed more swelling in the right UE and was referred by Dr. Griffin Basil for lymphedema treatment. She is also diabetic and has hypertension. She has LBP, prior cervical fusion, OA. She is using a rollator    Patient Stated Goals Decrease swelling in arm, gain information from provider    Currently in Pain? No/denies  LYMPHEDEMA/ONCOLOGY QUESTIONNAIRE - 05/06/21 0001       Right Upper Extremity Lymphedema   15 cm Proximal to Olecranon Process 40 cm    10 cm Proximal to Olecranon Process 43.1 cm    Olecranon Process 29 cm    15 cm Proximal to Ulnar Styloid Process 27.5 cm    10 cm Proximal to Ulnar Styloid Process 25 cm    Just Proximal to Ulnar Styloid Process 18.8 cm    Across Hand at [employer] 19.6 cm    At [place] of 2nd Digit 6.3 cm                                      PT Long Term Goals - 05/06/21 1358       PT LONG TERM GOAL #1   Title Pt will have decreased edema  at 15 cm prox to ulna and olecranon by 2-4 cm    Baseline 1.5 cm decrease 15 cm prox to ulna, 5.6 cm decrease 15 cm prox  to olecranon    Period Weeks    Status Partially Met    Target Date 07/29/21      PT LONG TERM GOAL #2   Title Pts husband will be independent with compression bandaging to help reduce right UE swelling.    Time 6    Period Weeks    Status Deferred      PT LONG TERM GOAL #3   Title Pt will be fit for appropiate compression garments and will be independent with husbands help in their use    Time 6    Period Weeks    Status On-going    Target Date 07/29/21      PT LONG TERM GOAL #4   Title Pt will be educated in skin care, and precautions for lymphedema    Time 6    Status On-going    Target Date 07/29/21                   Plan - 05/06/21 1352     Clinical Impression Statement Pt and her husband came for appt today.  Pt was remeasured with good results.  She had her sleeve folded at the wrist and it was rolling down at mid arm because she could not get it high enough.  Demonstrated the [place] like what she has at home and pt did quite well pulling her sleeve over it, although needed some assist at the top.  She was also shown how to use donning gloves.  She has not washed the sleeve in 2 weeks and was advised she sould be laundering atleast every 2 days.  We discussed that laundering my keep the sleeve from rolling at the top, but I also spoke with them about It Stays to use if it continues to roll.  They are going to purchase some today at [place] from [facility] touch is going to her house tomorrow to set up the pump for her.  Advised pt and her husband to call if they have questions    Personal Factors and Comorbidities Age;Comorbidity 3+    Comorbidities Right lumpectomy with SLNB and radiation, Recent Reverse right TSA, Cervical Fusion, LBP, OA,hypertension    Examination-Activity Limitations Lift;Reach Overhead;Locomotion Level    Examination-Participation Restrictions Cleaning    Stability/Clinical Decision Making Stable/Uncomplicated    Clinical Decision  Making  Low    Rehab Potential Good    PT Frequency --   2 visits   PT Duration 12 weeks    PT Treatment/Interventions ADLs/Self Care Home Management;Therapeutic exercise;Manual techniques;Orthotic Fit/Training;Patient/family education;Manual lymph drainage;Compression bandaging;Scar mobilization;Passive range of motion    PT Next Visit Plan how is garment and pump? remeasure, donning sleeve, laundering    Consulted and Agree with Plan of Care Patient             Patient will benefit from skilled therapeutic intervention in order to improve the following deficits and impairments:  Decreased range of motion, Decreased scar mobility, Decreased strength, Increased edema, Postural dysfunction, Impaired UE functional use  Visit Diagnosis: Lymphedema, not elsewhere classified  Stiffness of right shoulder, not elsewhere classified  Muscle weakness (generalized)  Aftercare following surgery for neoplasm     Problem List Patient Active Problem List   Diagnosis Date Noted   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   Allergic rhinitis due to animal (cat) (dog) hair and dander 07/18/2020   Leiomyoma 07/18/2020   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   Anxiety state    Acute blood loss anemia    Postoperative pain    Cervical myelopathy (H[facility]) 09/22/2019   S/P cervical spinal fusion 09/22/2019   Cervical spondylosis with myelopathy and radiculopathy 09/15/2019   AKI (acute kidney injury) ([place]) 09/14/2019   GERD (gastroesophageal reflux disease) 09/14/2019   Spinal stenosis in cervical region 09/14/2019   Weakness 09/14/2019   Numbness and tingling 09/14/2019   Cervical spinal stenosis 09/14/2019   Status post cataract extraction and insertion of intraocular lens of left eye 09/04/2019   Lumbar stenosis 08/24/2019   Allergic rhinitis due to pollen 06/06/2019   Persistent cough 06/06/2019   History of Descemet membrane endothelial  keratoplasty (DMEK) 01/04/2019   Status post cataract extraction and insertion of intraocular lens of right eye 01/04/2019   Senile nuclear sclerosis 01/02/2019   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   Anatomical narrow angle, 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   Obesity, morbid, BMI 40.0-49.9 ([place]) 04/19/2018   Morbid obesity ([place]) 04/19/2018   Cataract    Fibroid    Endometriosis    DM (diabetes mellitus) type II controlled with renal manifestation ([place]) 06/17/2010   Dyslipidemia 06/17/2010   Microcytic anemia 06/17/2010   Essential hypertension 06/17/2010   Allergic rhinitis 06/17/2010   Osteoarthritis 06/17/2010   LOW BACK PAIN 06/17/2010   OSTEOPENIA 06/17/2010   BREAST CANCER, HX OF 06/17/2010   Disorder of skeletal system 06/17/2010    Claris Pong, PT 05/06/2021, 3:11 PM  [facility] @ [place], [place], [address] Phone: [phone]   Fax:  [phone]  Name: Doris Jackson MRN: [id number] Date of Birth: 1941-05-21

## 2021-05-09 ENCOUNTER — Telehealth: Payer: Self-pay | Admitting: Family Medicine

## 2021-05-09 NOTE — Telephone Encounter (Signed)
Bone density order from 11/19/2020 was printed and faxed to [facility] at [phone].

## 2021-05-09 NOTE — Telephone Encounter (Signed)
Doris Jackson with [facility] is calling and needs order for pt to have bone density test. Pt had last bmd at [facility] on 08-26-2020. Pt has osteoporosis. Please fax to [phone]. Pt has an appt tomorrow morning

## 2021-05-10 ENCOUNTER — Encounter: Payer: Self-pay | Admitting: Family Medicine

## 2021-05-10 DIAGNOSIS — Z1231 Encounter for screening mammogram for malignant neoplasm of breast: Secondary | ICD-10-CM | POA: Diagnosis not present

## 2021-05-10 DIAGNOSIS — M85852 Other specified disorders of bone density and structure, left thigh: Secondary | ICD-10-CM | POA: Diagnosis not present

## 2021-05-10 DIAGNOSIS — Z780 Asymptomatic menopausal state: Secondary | ICD-10-CM | POA: Diagnosis not present

## 2021-05-10 DIAGNOSIS — M85851 Other specified disorders of bone density and structure, right thigh: Secondary | ICD-10-CM | POA: Diagnosis not present

## 2021-05-10 DIAGNOSIS — Z853 Personal history of malignant neoplasm of breast: Secondary | ICD-10-CM | POA: Diagnosis not present

## 2021-05-10 LAB — HM MAMMOGRAPHY

## 2021-05-13 ENCOUNTER — Ambulatory Visit (INDEPENDENT_AMBULATORY_CARE_PROVIDER_SITE_OTHER): Payer: Medicare HMO | Admitting: Family Medicine

## 2021-05-13 ENCOUNTER — Encounter: Payer: Self-pay | Admitting: Family Medicine

## 2021-05-13 VITALS — BP 110/60 | HR 83 | Temp 97.8°F | Ht 62.0 in | Wt 236.1 lb

## 2021-05-13 DIAGNOSIS — M545 Low back pain, unspecified: Secondary | ICD-10-CM

## 2021-05-13 DIAGNOSIS — K219 Gastro-esophageal reflux disease without esophagitis: Secondary | ICD-10-CM

## 2021-05-13 DIAGNOSIS — E1129 Type 2 diabetes mellitus with other diabetic kidney complication: Secondary | ICD-10-CM

## 2021-05-13 DIAGNOSIS — Z23 Encounter for immunization: Secondary | ICD-10-CM

## 2021-05-13 DIAGNOSIS — G8929 Other chronic pain: Secondary | ICD-10-CM | POA: Diagnosis not present

## 2021-05-13 DIAGNOSIS — J309 Allergic rhinitis, unspecified: Secondary | ICD-10-CM

## 2021-05-13 DIAGNOSIS — E785 Hyperlipidemia, unspecified: Secondary | ICD-10-CM | POA: Diagnosis not present

## 2021-05-13 DIAGNOSIS — I10 Essential (primary) hypertension: Secondary | ICD-10-CM

## 2021-05-13 LAB — LIPID PANEL
Cholesterol: 162 mg/dL (ref 0–200)
HDL: 48 mg/dL (ref >39.00)
LDL Cholesterol: 88 mg/dL (ref 0–99)
NonHDL: 113.8
Total CHOL/HDL Ratio: 3
Triglycerides: 130 mg/dL (ref 0.0–149.0)
VLDL: 26 mg/dL (ref 0.0–40.0)

## 2021-05-13 LAB — CBC WITH DIFFERENTIAL/PLATELET
Basophils Absolute: 0 10*3/uL (ref 0.0–0.1)
Basophils Relative: 0.3 % (ref 0.0–3.0)
Eosinophils Absolute: 0.2 10*3/uL (ref 0.0–0.7)
Eosinophils Relative: 3 % (ref 0.0–5.0)
HCT: 29.3 % — ABNORMAL LOW (ref 36.0–46.0)
Hemoglobin: 9.7 g/dL — ABNORMAL LOW (ref 12.0–15.0)
Lymphocytes Relative: 29.3 % (ref 12.0–46.0)
Lymphs Abs: 1.7 10*3/uL (ref 0.7–4.0)
MCHC: 33.3 g/dL (ref 30.0–36.0)
MCV: 77.6 fl — ABNORMAL LOW (ref 78.0–100.0)
Monocytes Absolute: 0.7 10*3/uL (ref 0.1–1.0)
Monocytes Relative: 12.3 % — ABNORMAL HIGH (ref 3.0–12.0)
Neutro Abs: 3.2 10*3/uL (ref 1.4–7.7)
Neutrophils Relative %: 55.1 % (ref 43.0–77.0)
Platelets: 127 10*3/uL — ABNORMAL LOW (ref 150.0–400.0)
RBC: 3.77 Mil/uL — ABNORMAL LOW (ref 3.87–5.11)
RDW: 18.5 % — ABNORMAL HIGH (ref 11.5–15.5)
WBC: 5.9 10*3/uL (ref 4.0–10.5)

## 2021-05-13 LAB — COMPREHENSIVE METABOLIC PANEL
ALT: 11 U/L (ref 0–35)
AST: 13 U/L (ref 0–37)
Albumin: 3.9 g/dL (ref 3.5–5.2)
Alkaline Phosphatase: 97 U/L (ref 39–117)
BUN: 33 mg/dL — ABNORMAL HIGH (ref 6–23)
CO2: 29 mEq/L (ref 19–32)
Calcium: 9.2 mg/dL (ref 8.4–10.5)
Chloride: 105 mEq/L (ref 96–112)
Creatinine, Ser: 1.39 mg/dL — ABNORMAL HIGH (ref 0.40–1.20)
GFR: 36.14 mL/min — ABNORMAL LOW (ref >60.00)
Glucose, Bld: 146 mg/dL — ABNORMAL HIGH (ref 70–99)
Potassium: 3.9 mEq/L (ref 3.5–5.1)
Sodium: 141 mEq/L (ref 135–145)
Total Bilirubin: 0.4 mg/dL (ref 0.2–1.2)
Total Protein: 7.5 g/dL (ref 6.0–8.3)

## 2021-05-13 LAB — MICROALBUMIN / CREATININE URINE RATIO
Creatinine,U: 113.9 mg/dL
Microalb Creat Ratio: 0.6 mg/g (ref 0.0–30.0)
Microalb, Ur: <0.7 mg/dL (ref 0.0–1.9)

## 2021-05-13 LAB — HEMOGLOBIN A1C: Hgb A1c MFr Bld: 7.5 % — ABNORMAL HIGH (ref 4.6–6.5)

## 2021-05-13 NOTE — Progress Notes (Signed)
Doris Jackson DOB: Jul 15, 1940 Encounter date: 05/13/2021  This is a 80 y.o. female who presents with Chief Complaint  Patient presents with   Follow-up    History of present illness:   Mobility of right arm is better now she is wearing compression sleeve and now has home machine to help with the lymphedema. Just started this at home last week; prior to that was getting manual compression therapy. Not sure how long she will need this.   HTN: checked this morning and was 136/78, but hadn't had breakfast and hasn't been checking regularly. Has seen an occasional number as low as today's at home, but not regular.   She is up and around more. Trying to stay away from sweets.   GERD: stops eating in evening to hlep with this. Still taking pepcid at bedtime. She is taking 1m, but would like to cut back.   Eyes are doing ok; has appointment last of this month. Using eye drops every other day.   Has some pains in lower back. Hard time standing up with lower back. Thinks she needs to see ortho.  She had discussed this previously with provider, but neck was more urgent need, so this was addressed first.    Allergies  Allergen Reactions   Dust Mite Extract Itching and Cough   Latex Itching    Powder gloves   Current Meds  Medication Sig   Ascorbic Acid (VITAMIN C) 1000 MG tablet Take 1,000 mg by mouth daily.   benazepril (LOTENSIN) 40 MG tablet Take 1 tablet by mouth once daily   Blood Glucose Monitoring Suppl (ONETOUCH VERIO REFLECT) w/Device KIT USE UP TO FOUR TIMES DAILY AS DIRECTED.   Cholecalciferol (VITAMIN D) 2000 UNITS CAPS Take 4,000 Units by mouth daily.    cyanocobalamin 2000 MCG tablet Take 2,000 mcg by mouth daily.   famotidine (PEPCID) 40 MG tablet Take 1 tablet (40 mg total) by mouth at bedtime.   FERREX 150 150 MG capsule TAKE 1 CAPSULE BY MOUTH ONCE DAILY ** PATIENT NEEDS AN APPOINTMENT **   fexofenadine (ALLEGRA) 180 MG tablet Take 180 mg by mouth daily as needed  for allergies.   fluticasone (FLONASE) 50 MCG/ACT nasal spray Place 1 spray into both nostrils daily.   furosemide (LASIX) 20 MG tablet Take 1 tablet (20 mg total) by mouth daily as needed.   glipiZIDE (GLUCOTROL XL) 5 MG 24 hr tablet Take 1 tablet by mouth once daily   glucose blood (ONETOUCH VERIO) test strip USE  STRIP TO CHECK GLUCOSE 2-4 times daily as needed for glucose control   hydrochlorothiazide (HYDRODIURIL) 12.5 MG tablet Take 1 tablet by mouth once daily   Lancets 30G MISC USE TWICE DAILY AS DIRECTED FOR GLUCOSE TESTING AND MONITORING   Lancets MISC Use as directed   metoprolol succinate (TOPROL-XL) 50 MG 24 hr tablet TAKE 1 TABLET BY MOUTH ONCE DAILY .  TAKE  WITH  OR  IMMEDIATELY  FOLLOWING  A  MEAL ** PATIENT NEEDS AN APPOINTMENT**   ONE TOUCH LANCETS MISC Test twice daily.   pioglitazone (ACTOS) 45 MG tablet Take 1 tablet by mouth once daily   pravastatin (PRAVACHOL) 40 MG tablet Take 1 tablet (40 mg total) by mouth daily.   prednisoLONE acetate (PRED FORTE) 1 % ophthalmic suspension Place 1 drop into both eyes See admin instructions. Place 1 drop into left eye three times daily Place 1 drop into right eye once daily   Probiotic Product (PROBIOTIC-10 PO) Take 1  capsule by mouth daily.   senna-docusate (SENOKOT-S) 8.6-50 MG tablet Take 1 tablet by mouth at bedtime.    Review of Systems  Constitutional:  Negative for chills, fatigue and fever.  Respiratory:  Negative for cough, chest tightness, shortness of breath and wheezing.   Cardiovascular:  Positive for leg swelling (generally controlled with compression). Negative for chest pain and palpitations.  Musculoskeletal:  Positive for back pain.   Objective:  BP 110/60 (BP Location: Left Arm, Patient Position: Sitting, Cuff Size: Large)   Pulse 83   Temp 97.8 F (36.6 C) (Oral)   Ht '5\' 2"'  (1.575 m)   Wt 236 lb 1.6 oz (107.1 kg)   SpO2 98%   BMI 43.18 kg/m   Weight: 236 lb 1.6 oz (107.1 kg)   BP Readings from Last 3  Encounters:  05/13/21 110/60  12/12/20 132/60  09/27/20 132/64   Wt Readings from Last 3 Encounters:  05/13/21 236 lb 1.6 oz (107.1 kg)  12/12/20 223 lb 4.8 oz (101.3 kg)  09/19/20 226 lb 2.7 oz (102.6 kg)    Physical Exam Constitutional:      General: She is not in acute distress.    Appearance: She is well-developed.  HENT:     Head: Normocephalic and atraumatic.  Cardiovascular:     Rate and Rhythm: Normal rate and regular rhythm.     Heart sounds: Normal heart sounds. No murmur heard. Pulmonary:     Effort: Pulmonary effort is normal.     Breath sounds: Normal breath sounds.  Abdominal:     General: Bowel sounds are normal. There is no distension.     Palpations: Abdomen is soft.     Tenderness: There is no abdominal tenderness. There is no guarding.  Musculoskeletal:     Right lower leg: 1+ Edema present.     Left lower leg: 2+ Edema present.  Feet:     Comments: Normal bilateral monofilament foot exam.  Mild heel callus.  Skin is intact. Toenails are thickened. She does trim own nails; harder on left foot. She plans to make visit to see podiatry. Skin:    General: Skin is warm and dry.     Comments: Sensory exam of the foot is normal, tested with the monofilament. Good pulses, no lesions or ulcers, good peripheral pulses.  Psychiatric:        Judgment: Judgment normal.    Assessment/Plan  1. Need for prophylactic vaccination against Streptococcus pneumoniae (pneumococcus) - Pneumococcal conjugate vaccine 20-valent (Prevnar 20)  2. Essential hypertension Blood pressure is well controlled.  Even on the low end of normal today.  I have encouraged her to continue to monitor at home.  If regularly running low at home, we can look into backing off of medications. - CBC with Differential/Platelet; Future - Comprehensive metabolic panel; Future - Comprehensive metabolic panel - CBC with Differential/Platelet  3. 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  She continues on Actos, metformin, glipizide.  Recheck blood work today. - Hemoglobin A1c; Future - HM DIABETES FOOT EXAM - Microalbumin / creatinine urine ratio; Future - Microalbumin / creatinine urine ratio - Hemoglobin A1c  4. Dyslipidemia She has been on pravastatin 40 mg daily.  Recheck blood work. - Lipid panel; Future - Lipid panel  5. Allergic rhinitis, unspecified seasonality, unspecified trigger Using Allegra over-the-counter.  This tends to help with symptoms.  6. Gastroesophageal  reflux disease, unspecified whether esophagitis present Symptoms are generally well controlled with Pepcid 40 mg daily.  She would like to back off of this is if possible.  Encouraged her to try half a tablet to see if symptoms stay well controlled.  7. Chronic midline low back pain, unspecified whether sciatica present She will schedule follow-up with neurosurgeon.   Return in about 3 months (around 08/13/2021) for physical exam.      [name], MD

## 2021-05-14 ENCOUNTER — Telehealth: Payer: Self-pay | Admitting: Pharmacist

## 2021-05-14 ENCOUNTER — Encounter: Payer: Self-pay | Admitting: Family Medicine

## 2021-05-14 IMAGING — CT CT CERVICAL SPINE W/O CM
4 series · 16 of 33 positions shown, 19 images · non-contrast
Comparison: Radiograph 11/07/2019

CLINICAL DATA: Fall striking head. Cervical neck pain. Prior
cervical surgery.

EXAM:
CT CERVICAL SPINE WITHOUT CONTRAST
TECHNIQUE: Multidetector CT imaging of the cervical spine was performed without
intravenous contrast. Multiplanar CT image reconstructions were also
generated.

[Series 5: c spine soft · axial · 0.35mm/px · z∈[-268,-206]mm · 3 of 93 slices shown]
[im 16/93  soft-tissue]
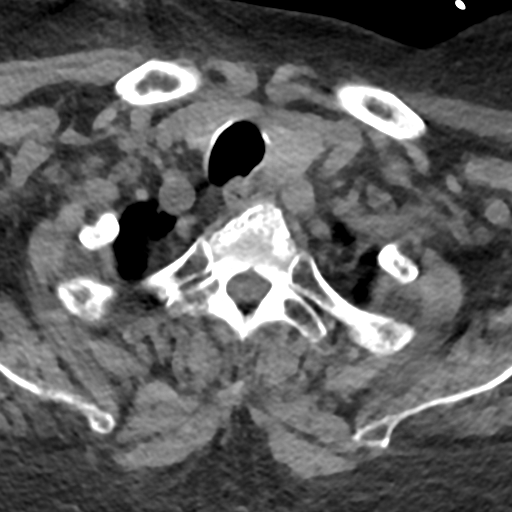
[im 31/93  soft-tissue]
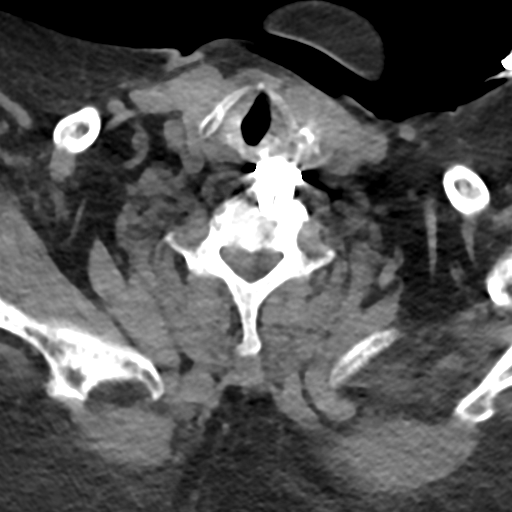
[im 47/93  soft-tissue]
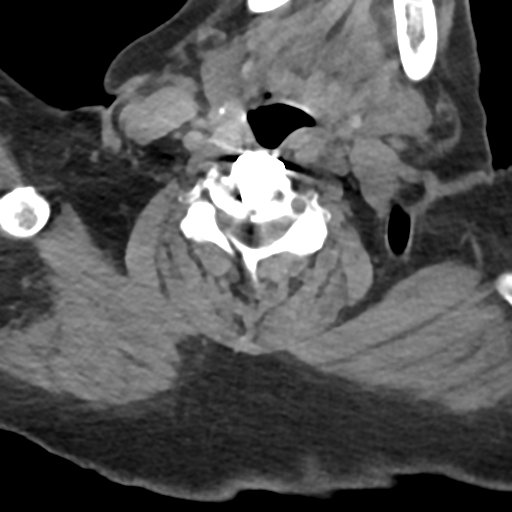

[Series 8: sag bone · sagittal · 0.37mm/px · 5 of 89 slices shown, 6 images]
[im 30/89  bone]
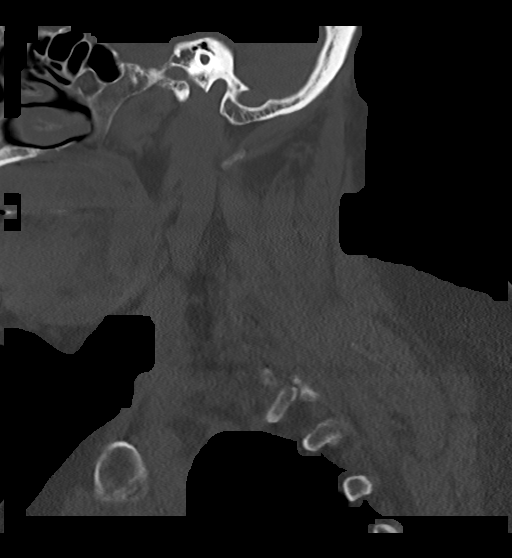
[im 37/89  bone]
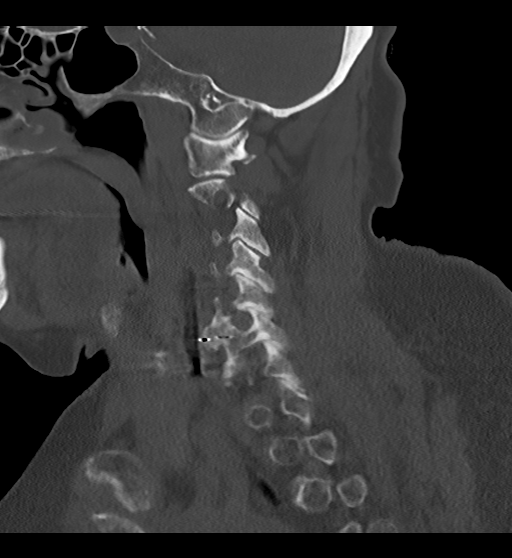
[im 45/89  soft-tissue]
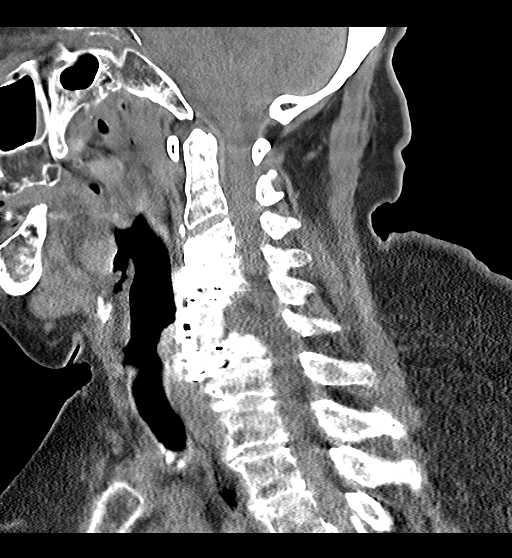
[im 45/89  bone]
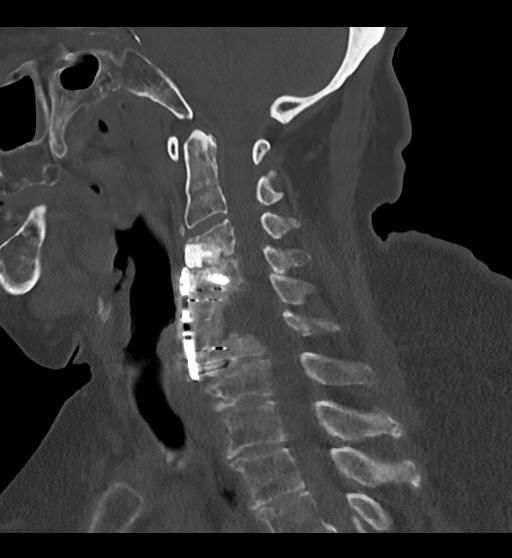
[im 52/89  bone]
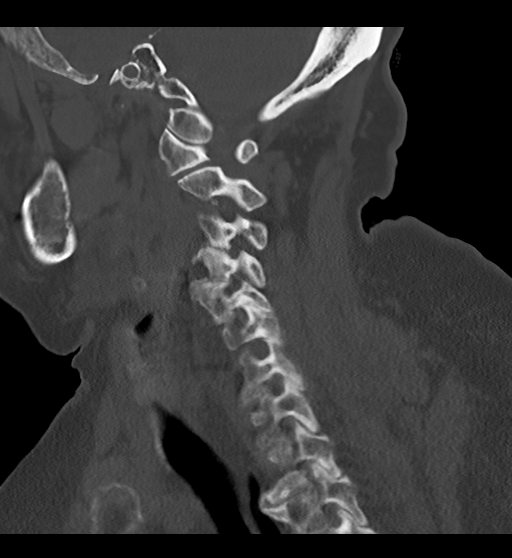
[im 59/89  bone]
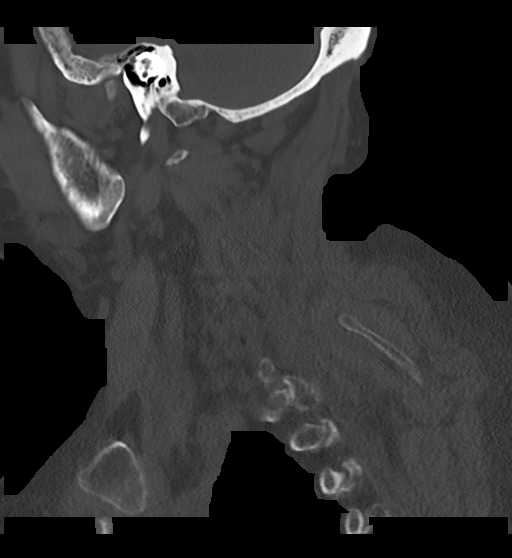

[Series 9: cor bone · coronal · 0.36mm/px · 3 of 42 slices shown]
[im 9/42  bone]
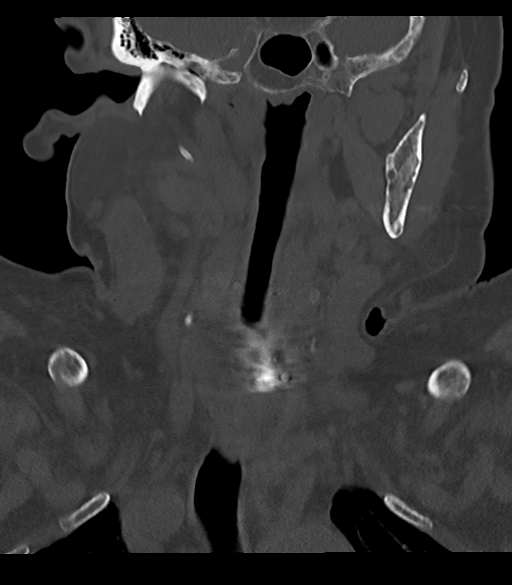
[im 17/42  bone]
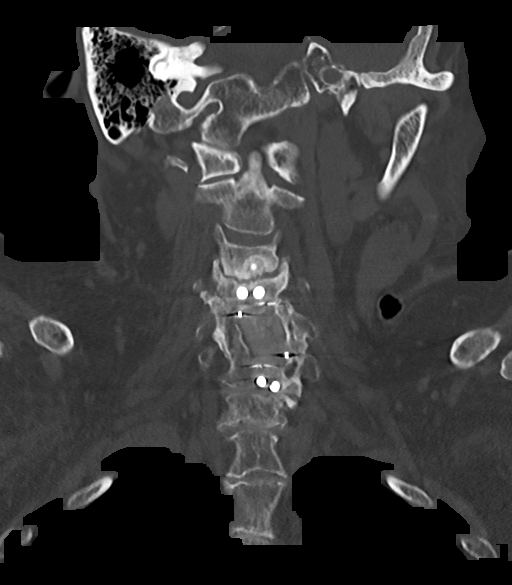
[im 25/42  bone]
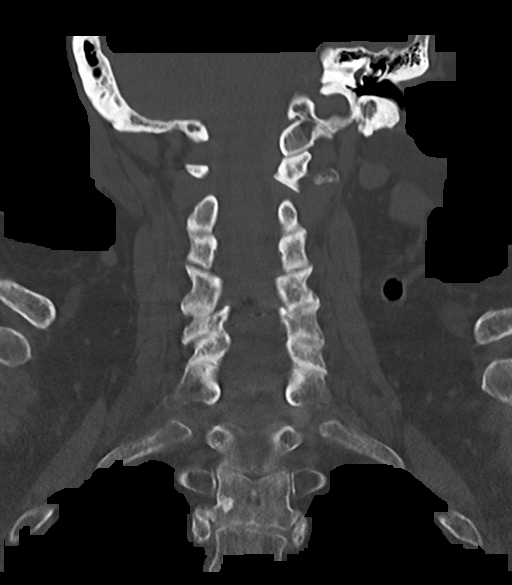

[Series 10: orthogonal axials · axial · 0.21mm/px · z∈[-284,-152]mm · 5 of 88 slices shown, 7 images]
[im 15/88  soft-tissue]
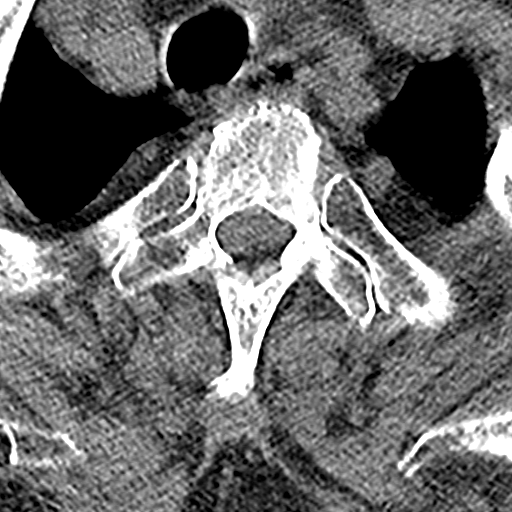
[im 15/88  bone]
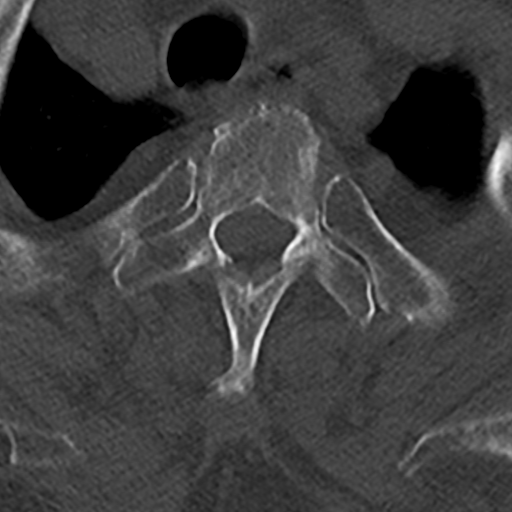
[im 30/88  bone]
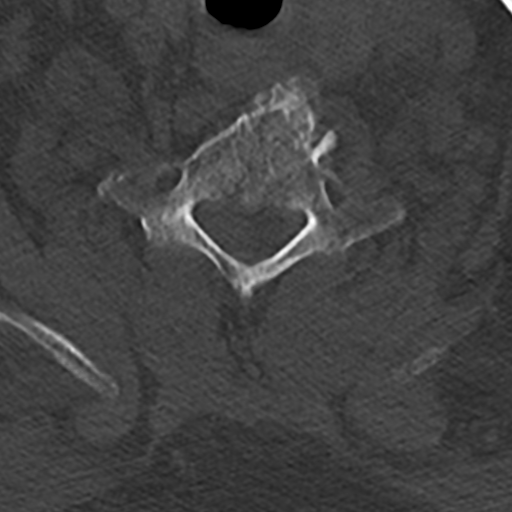
[im 44/88  bone]
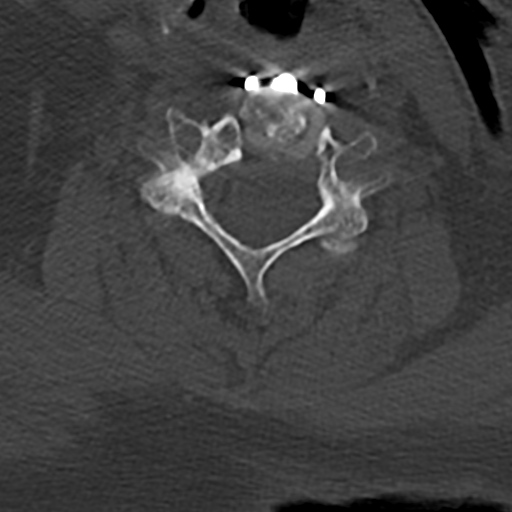
[im 59/88  bone]
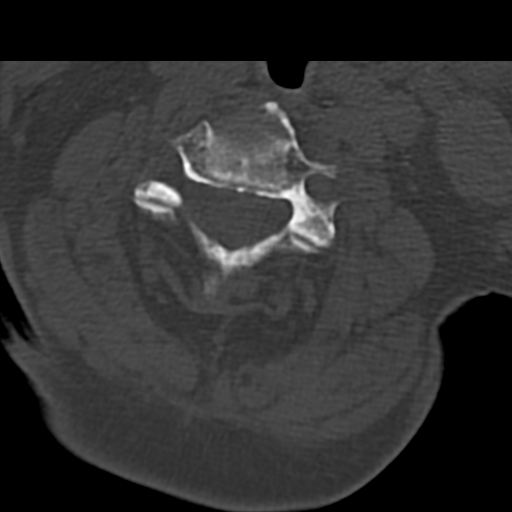
[im 73/88  soft-tissue]
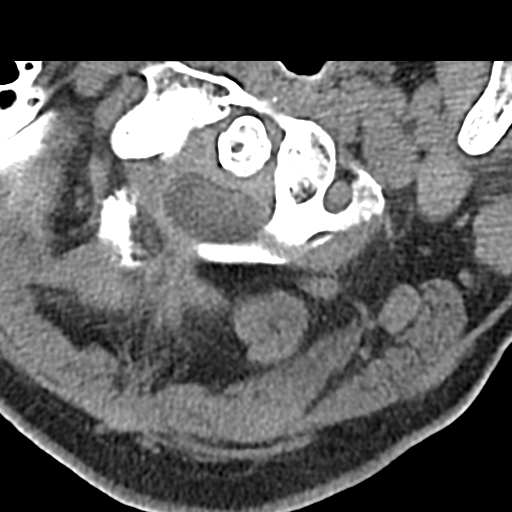
[im 73/88  bone]
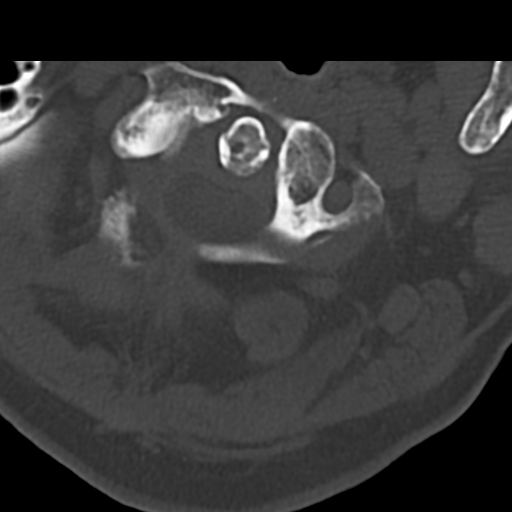

[16 of 33 positions shown; findings below may reference images not displayed]

FINDINGS: Alignment: No traumatic subluxation. Straightening of normal
lordosis.

Skull base and vertebrae: No acute fracture. The dens and skull base
are intact. Anterior fusion C4 through C6. No hardware fracture. The
C6 screws approach the C5-C6 disc space. Bone plug at C3-C4. Prior
C5 corpectomy. Scalloping posterior margin of C5 is likely related
to prior surgery.

Soft tissues and spinal canal: No prevertebral fluid or swelling. No
visible canal hematoma.

Disc levels: Anterior fusion C4 through C6. Bone plug anterior C3
with C3-C4 disc space narrowing, unchanged from prior. Disc space
narrowing C6-C7.

Upper chest: No acute or unexpected finding.

Other: None.
IMPRESSION: 1. No acute fracture or subluxation of the cervical spine.
2. Anterior fusion C4 through C6.

## 2021-05-14 IMAGING — CR DG HUMERUS 2V *R*
2 series · 2 of 2 positions shown · non-contrast
Comparison: None.

CLINICAL DATA: Fall onto right arm.  Right arm pain.

EXAM:
RIGHT HUMERUS - 2+ VIEW

[humerus ap]
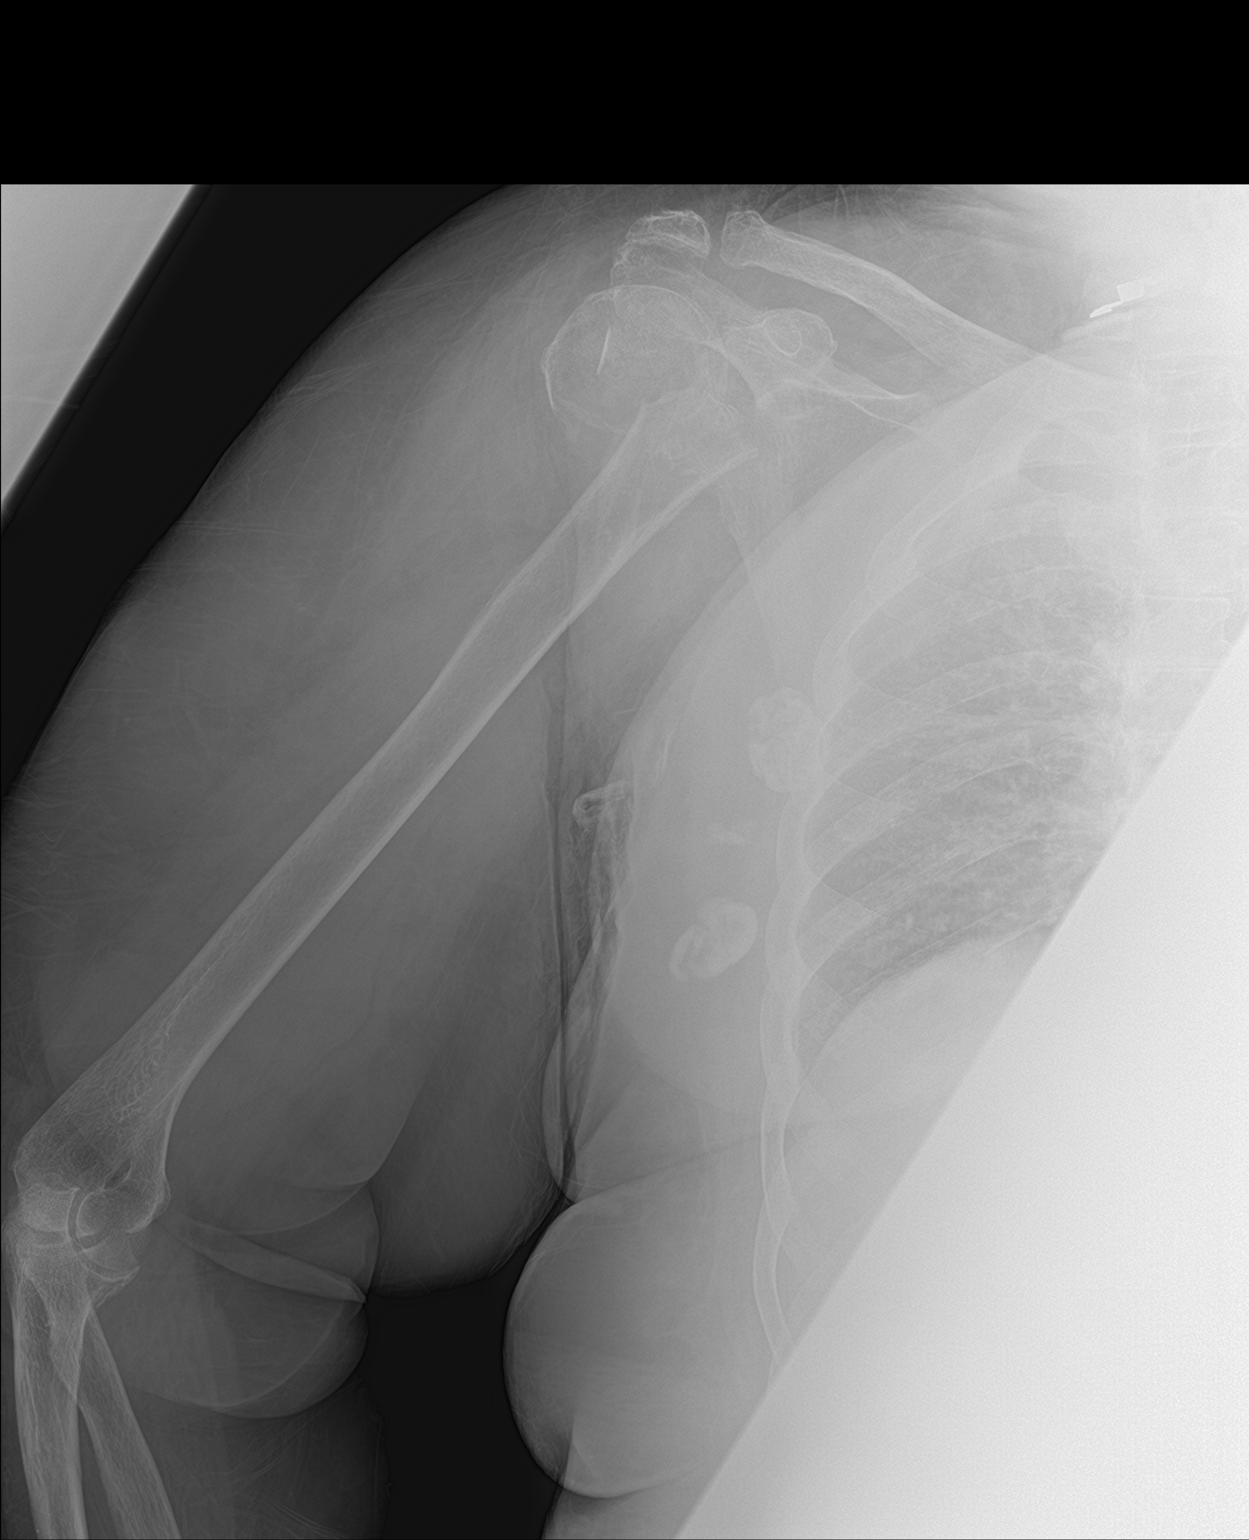

[humerus lat]
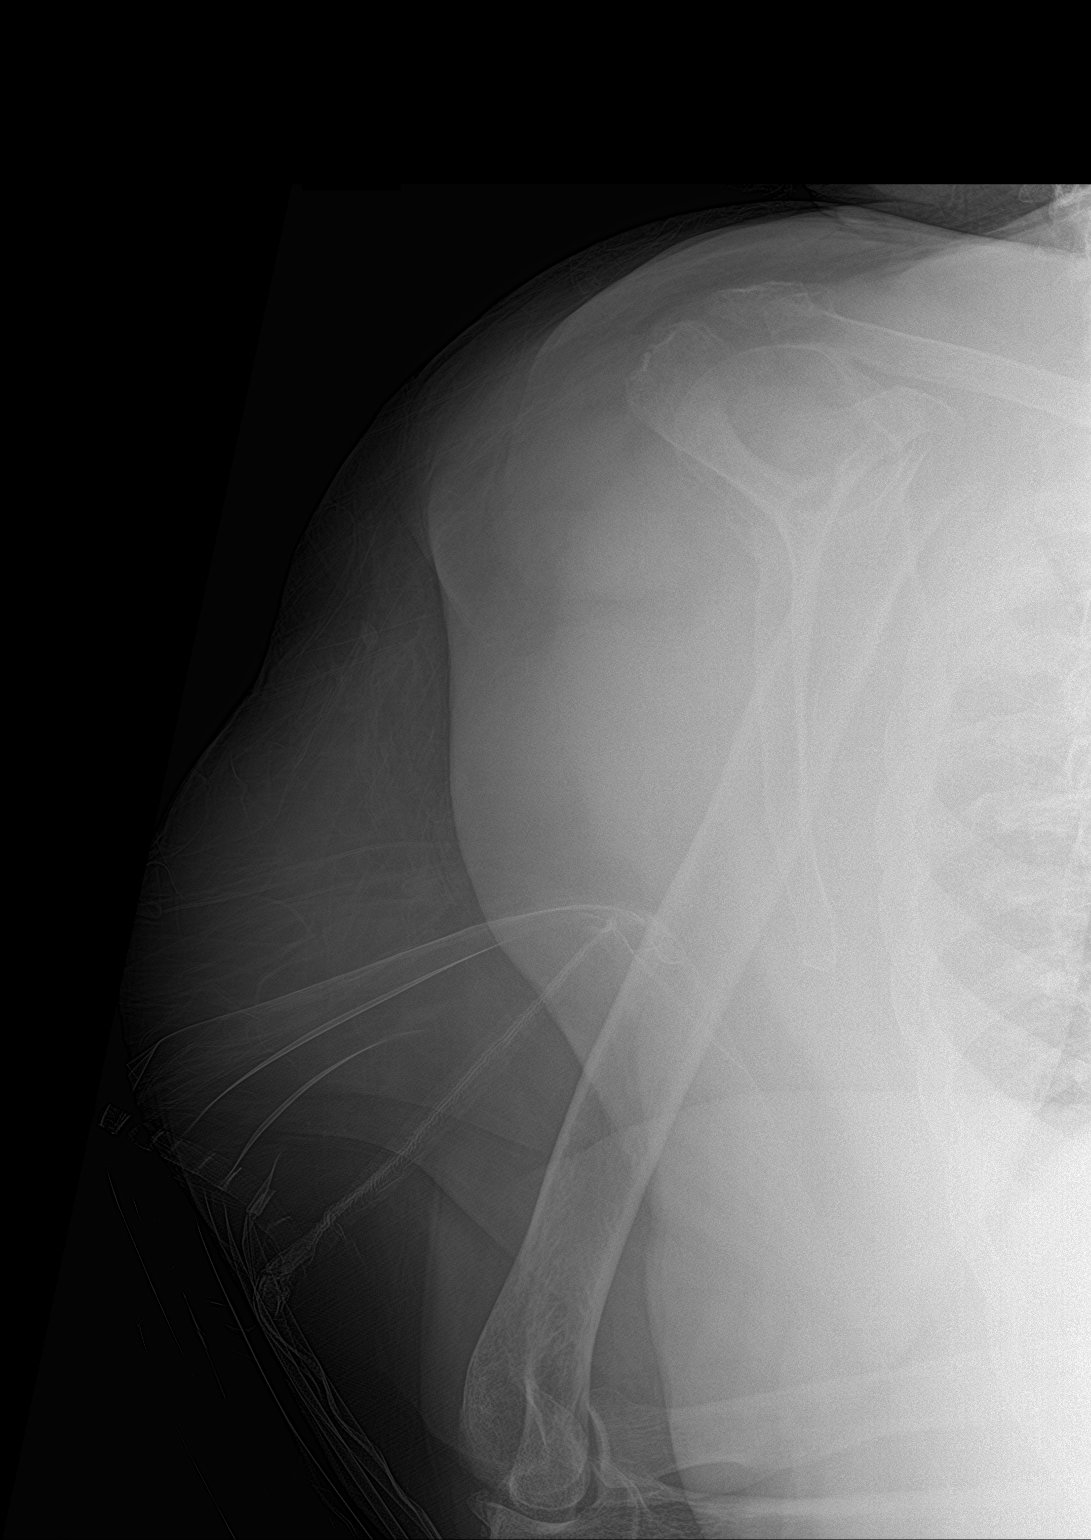

[2 of 2 positions shown; findings below may reference images not displayed]

FINDINGS: Proximal humeral fracture primarily through the surgical neck better
assessed on concurrent shoulder exam. The distal humerus is intact.
No distal humerus fracture. Elbow alignment assessed on concurrent
elbow exam.
IMPRESSION: Proximal humeral fracture primarily through the surgical neck,
better assessed on concurrent shoulder exam.

## 2021-05-14 NOTE — Chronic Care Management (AMB) (Signed)
Chronic Care Management Pharmacy Assistant   Name: Doris Jackson  MRN: [id number] DOB: 25-Jun-1941  05/14/21 APPOINTMENT REMINDER   Called [employer] No answer, left message of appointment on 05/15/21 at 3:45 via telephone visit with [name], Pharm D.   Notified to have all medications, supplements, blood pressure and/or blood sugar logs available during appointment and to return call if need to reschedule.    Care Gaps: BP - 110/60 (05/13/21) AWV - 11/13/20 TDAP - Overdue Zoster Vaccine - Overdue PAP Sm - Overdue COVID Booster - Overdue Lab Results  Component Value Date   HGBA1C 7.5 (H) 05/13/2021    Star Rating Drug: Glipizide 5 mg - Last filled 04/25/21 90 DS at [facility] Metformin 500 mg -  Last filled 11/09/20 90 DS at [facility] Pioglitazone 45 mg - Last filled 04/14/21 90 DS at [facility] Pravastatin 40 mg  - Last filled 03/31/21 90 DS at [facility]   Any gaps in medications fill history? Call to [facility] to verify the above is accurate  etformin 500 mg -  Last filled 11/09/20 90 DS at [facility]  Medications: Outpatient Encounter Medications as of 05/14/2021  Medication Sig Note   Ascorbic Acid (VITAMIN C) 1000 MG tablet Take 1,000 mg by mouth daily.    benazepril (LOTENSIN) 40 MG tablet Take 1 tablet by mouth once daily    Blood Glucose Monitoring Suppl (ONETOUCH VERIO REFLECT) w/Device KIT USE UP TO FOUR TIMES DAILY AS DIRECTED.    Cholecalciferol (VITAMIN D) 2000 UNITS CAPS Take 4,000 Units by mouth daily.     cyanocobalamin 2000 MCG tablet Take 2,000 mcg by mouth daily.    famotidine (PEPCID) 40 MG tablet Take 1 tablet (40 mg total) by mouth at bedtime.    FERREX 150 150 MG capsule TAKE 1 CAPSULE BY MOUTH ONCE DAILY ** PATIENT NEEDS AN APPOINTMENT **    fexofenadine (ALLEGRA) 180 MG tablet Take 180 mg by mouth daily as needed for allergies.    fluticasone (FLONASE) 50 MCG/ACT nasal spray Place 1 spray into both nostrils daily.    furosemide (LASIX) 20 MG tablet Take 1  tablet (20 mg total) by mouth daily as needed.    glipiZIDE (GLUCOTROL XL) 5 MG 24 hr tablet Take 1 tablet by mouth once daily    glucose blood (ONETOUCH VERIO) test strip USE  STRIP TO CHECK GLUCOSE 2-4 times daily as needed for glucose control    hydrochlorothiazide (HYDRODIURIL) 12.5 MG tablet Take 1 tablet by mouth once daily    Lancets 30G MISC USE TWICE DAILY AS DIRECTED FOR GLUCOSE TESTING AND MONITORING    Lancets MISC Use as directed    metFORMIN (GLUCOPHAGE XR) 500 MG 24 hr tablet Take 1 tablet (500 mg total) by mouth 2 (two) times daily.    metoprolol succinate (TOPROL-XL) 50 MG 24 hr tablet TAKE 1 TABLET BY MOUTH ONCE DAILY .  TAKE  WITH  OR  IMMEDIATELY  FOLLOWING  A  MEAL ** PATIENT NEEDS AN APPOINTMENT**    ONE TOUCH LANCETS MISC Test twice daily.    pioglitazone (ACTOS) 45 MG tablet Take 1 tablet by mouth once daily    pravastatin (PRAVACHOL) 40 MG tablet Take 1 tablet (40 mg total) by mouth daily.    prednisoLONE acetate (PRED FORTE) 1 % ophthalmic suspension Place 1 drop into both eyes See admin instructions. Place 1 drop into left eye three times daily Place 1 drop into right eye once daily 09/19/2020: LF : 08/21/20 30 day  supply   Probiotic Product (PROBIOTIC-10 PO) Take 1 capsule by mouth daily.    senna-docusate (SENOKOT-S) 8.6-50 MG tablet Take 1 tablet by mouth at bedtime.    No facility-administered encounter medications on file as of 05/14/2021.   [place] Clinical Pharmacist Assistant [phone]

## 2021-05-15 ENCOUNTER — Ambulatory Visit (INDEPENDENT_AMBULATORY_CARE_PROVIDER_SITE_OTHER): Payer: Medicare HMO | Admitting: Pharmacist

## 2021-05-15 DIAGNOSIS — E1129 Type 2 diabetes mellitus with other diabetic kidney complication: Secondary | ICD-10-CM

## 2021-05-15 DIAGNOSIS — I10 Essential (primary) hypertension: Secondary | ICD-10-CM

## 2021-05-15 NOTE — Progress Notes (Signed)
Chronic Care Management Pharmacy Note  05/17/2021 Name:  Doris Jackson MRN:  [id number] DOB:  May 13, 1941  Summary: A1c not at goal < 7%   Recommendations/Changes made from today's visit: -Recommend starting Rybelsus 3 mg and stopping glipizide -Recommend taking metformin at least once daily consistently -Recommend increasing ferrex   Plan: Call in free trial coupon for Rybelsus to pharmacy DM assessment in 2 weeks  Subjective: Doris Jackson is an 80 y.o. year old female who is a primary patient of Koberlein, Steele Berg, MD.  The CCM team was consulted for assistance with disease management and care coordination needs.    Engaged with patient by telephone for follow up visit in response to provider referral for pharmacy case management and/or care coordination services.   Consent to Services:  The patient was given information about Chronic Care Management services, agreed to services, and gave verbal consent prior to initiation of services.  Please see initial visit note for detailed documentation.   Patient Care Team: Caren Macadam, MD as PCP - General (Family Medicine) Viona Gilmore, [facility] as Pharmacist (Pharmacist)  Recent office visits: 05/13/21 [name], MD: Patient presented for chronic conditions follow up. Prevnar 20 administered. Follow up in 3 months for CPE.  12/12/20 [name], MD: Patient presented for chronic conditions follow up. Referred to OT.  11/13/20 Charlott Rakes, LPN: Patient presented for AWV.  Recent consult visits: 05/06/21 [name], PT (outpatient rehab): Patient presented for PT session.  05/06/21 [name], MD (allergy): Patient presented for allergy shots.  06/18/20 Doris Keeler, PA-C (pulmonology): Patient presented the cough follow up. Recommended to stop omeprazole and continue famotidine 20 mg daily. Pt reports Advair is not covered by insurance.  06/12/20 Dineen Kid, MD (ophthalmology): Patient presented  for K recheck.  06/05/20 Heather [place] (optometry): Patient presented for diabetic eye exam.  Hospital visits: 3/23-3/31/22 Patient admitted for reverse shoulder arthroplasty after fracture.  09/13/20 Patient presented to the ED post fall and shoulder fracture.  Objective:  Lab Results  Component Value Date   CREATININE 1.39 (H) 05/13/2021   BUN 33 (H) 05/13/2021   GFR 36.14 (L) 05/13/2021   GFRNONAA 45 (L) 09/24/2020   GFRAA >60 10/21/2019   NA 141 05/13/2021   K 3.9 05/13/2021   CALCIUM 9.2 05/13/2021   CO2 29 05/13/2021   GLUCOSE 146 (H) 05/13/2021    Lab Results  Component Value Date/Time   HGBA1C 7.5 (H) 05/13/2021 01:53 PM   HGBA1C 6.6 (H) 09/19/2020 08:08 AM   GFR 36.14 (L) 05/13/2021 01:53 PM   GFR 64.16 12/19/2019 01:04 PM   MICROALBUR <0.7 05/13/2021 01:53 PM   MICROALBUR 0.7 03/21/2020 11:45 AM    Last diabetic Eye exam:  Lab Results  Component Value Date/Time   HMDIABEYEEXA No Retinopathy 06/05/2020 12:00 AM    Last diabetic Foot exam: No results found for: HMDIABFOOTEX   Lab Results  Component Value Date   CHOL 162 05/13/2021   HDL 48.00 05/13/2021   LDLCALC 88 05/13/2021   TRIG 130.0 05/13/2021   CHOLHDL 3 05/13/2021    Hepatic Function Latest Ref Rng & Units 05/13/2021 03/21/2020 11/16/2019  Total Protein 6.0 - 8.3 g/dL 7.5 7.1 7.3  Albumin 3.5 - 5.2 g/dL 3.9 - 3.9  AST 0 - 37 U/L '13 10 11  ' ALT 0 - 35 U/L '11 6 6  ' Alk Phosphatase 39 - 117 U/L 97 - 110  Total Bilirubin 0.2 - 1.2 mg/dL 0.4 0.4 0.5  Bilirubin,  Direct 0.0 - 0.3 mg/dL - - -    Lab Results  Component Value Date/Time   TSH 1.20 09/07/2019 10:52 AM   TSH 1.20 07/02/2017 01:30 PM    CBC Latest Ref Rng & Units 05/13/2021 09/25/2020 09/24/2020  WBC 4.0 - 10.5 K/uL 5.9 8.4 8.0  Hemoglobin 12.0 - 15.0 g/dL 9.7(L) 8.1(L) 7.2(L)  Hematocrit 36.0 - 46.0 % 29.3(L) 24.3(L) 22.0(L)  Platelets 150.0 - 400.0 K/uL 127.0 Repeated and verified X2.(L) 242 221    Lab Results  Component Value  Date/Time   VD25OH 61 03/21/2020 11:45 AM   VD25OH 37 04/20/2012 11:11 AM    Clinical ASCVD: No  The 10-year ASCVD risk score (Arnett DK, et al., 2019) is: 26.3%   Values used to calculate the score:     Age: 80 years     Sex: Female     Is Non-Hispanic African American: Yes     Diabetic: Yes     Tobacco smoker: No     Systolic Blood Pressure: 078 mmHg     Is BP treated: Yes     HDL Cholesterol: 48 mg/dL     Total Cholesterol: 162 mg/dL    Depression screen Doris Jackson 2/9 11/13/2020 05/25/2018 04/19/2018  Decreased Interest 0 0 0  Down, Depressed, Hopeless 0 0 0  PHQ - 2 Score 0 0 0  Some recent data might be hidden      Social History   Tobacco Use  Smoking Status Never  Smokeless Tobacco Never   BP Readings from Last 3 Encounters:  05/13/21 110/60  12/12/20 132/60  09/27/20 132/64   Pulse Readings from Last 3 Encounters:  05/13/21 83  12/12/20 83  09/27/20 90   Wt Readings from Last 3 Encounters:  05/13/21 236 lb 1.6 oz (107.1 kg)  12/12/20 223 lb 4.8 oz (101.3 kg)  09/19/20 226 lb 2.7 oz (102.6 kg)   BMI Readings from Last 3 Encounters:  05/13/21 43.18 kg/m  12/12/20 40.84 kg/m  09/19/20 41.37 kg/m    Assessment/Interventions: Review of patient past medical history, allergies, medications, health status, including review of consultants reports, laboratory and other test data, was performed as part of comprehensive evaluation and provision of chronic care management services.   SDOH:  (Social Determinants of Health) assessments and interventions performed: No  SDOH Screenings   Alcohol Screen: Not on file  Depression (PHQ2-9): Low Risk    PHQ-2 Score: 0  Financial Resource Strain: Low Risk    Difficulty of Paying Living Expenses: Not very hard  Food Insecurity: No Food Insecurity   Worried About Charity fundraiser in the Last Year: Never true   Ran Out of Food in the Last Year: Never true  Housing: Low Risk    Last Housing Risk Score: 0  Physical  Activity: Insufficiently Active   Days of Exercise per Week: 3 days   Minutes of Exercise per Session: 30 min  Social Connections: Moderately Isolated   Frequency of Communication with Friends and Family: Twice a week   Frequency of Social Gatherings with Friends and Family: Twice a week   Attends Religious Services: Never   Marine scientist or Organizations: No   Attends Music therapist: Never   Marital Status: Married  Stress: No Stress Concern Present   Feeling of Stress : Not at all  Tobacco Use: Low Risk    Smoking Tobacco Use: Never   Smokeless Tobacco Use: Never   Passive Exposure: Not on file  Transportation Needs: Not on file   Patient reports she is currently going to OT sessions twice a week and this will be going on for 4 more weeks. She did not end up getting admitted to a SNF.   CCM Care Plan  Allergies  Allergen Reactions   Dust Mite Extract Itching and Cough   Latex Itching    Powder gloves    Medications Reviewed Today     Reviewed by Agnes Lawrence, CMA (Certified Medical Assistant) on 05/13/21 at 1228  Med List Status: <None>   Medication Order Taking? Sig Documenting Provider Last Dose Status Informant  Ascorbic Acid (VITAMIN C) 1000 MG tablet [id number] Yes Take 1,000 mg by mouth daily. [provider] Taking Active   benazepril (LOTENSIN) 40 MG tablet [id number] Yes Take 1 tablet by mouth once daily Koberlein, Steele Berg, MD Taking Active   Blood Glucose Monitoring Suppl (ONETOUCH VERIO REFLECT) w/Device KIT [id number] Yes USE UP TO FOUR TIMES DAILY AS DIRECTED. Caren Macadam, MD Taking Active   Cholecalciferol (VITAMIN D) 2000 UNITS CAPS [id number] Yes Take 4,000 Units by mouth daily.  [provider] Taking Active Self  cyanocobalamin 2000 MCG tablet [id number] Yes Take 2,000 mcg by mouth daily. [provider] Taking Active Self  famotidine (PEPCID) 40 MG tablet [id number] Yes Take 1 tablet (40 mg total) by  mouth at bedtime. Caren Macadam, MD Taking Active   FERREX 150 150 MG capsule [id number] Yes TAKE 1 CAPSULE BY MOUTH ONCE DAILY ** PATIENT NEEDS AN APPOINTMENT ** Koberlein, Junell C, MD Taking Active   fexofenadine (ALLEGRA) 180 MG tablet [id number] Yes Take 180 mg by mouth daily as needed for allergies. [provider] Taking Active   fluticasone (FLONASE) 50 MCG/ACT nasal spray [id number] Yes Place 1 spray into both nostrils daily. [provider] Taking Active   furosemide (LASIX) 20 MG tablet [id number] Yes Take 1 tablet (20 mg total) by mouth daily as needed. Caren Macadam, MD Taking Active   glipiZIDE (GLUCOTROL XL) 5 MG 24 hr tablet [id number] Yes Take 1 tablet by mouth once daily Koberlein, Junell C, MD Taking Active   glucose blood (ONETOUCH VERIO) test strip [id number] Yes USE  STRIP TO CHECK GLUCOSE 2-4 times daily as needed for glucose control Koberlein, Junell C, MD Taking Active   hydrochlorothiazide (HYDRODIURIL) 12.5 MG tablet [id number] Yes Take 1 tablet by mouth once daily Caren Macadam, MD Taking Active   Lancets 30G MISC [id number] Yes USE TWICE DAILY AS DIRECTED FOR GLUCOSE TESTING AND MONITORING Marletta Lor, MD Taking Active Self  Lancets [place] [id number] Yes Use as directed Caren Macadam, MD Taking Active   metFORMIN (GLUCOPHAGE XR) 500 MG 24 hr tablet [id number]  Take 1 tablet (500 mg total) by mouth 2 (two) times daily. Caren Macadam, MD  Expired 12/09/20 2359   metoprolol succinate (TOPROL-XL) 50 MG 24 hr tablet [id number] Yes TAKE 1 TABLET BY MOUTH ONCE DAILY .  TAKE  WITH  OR  IMMEDIATELY  FOLLOWING  A  MEAL ** PATIENT NEEDS AN APPOINTMENT** Caren Macadam, MD Taking Active   ONE [facility] LANCETS MISC [id number] Yes Test twice daily. Marletta Lor, MD Taking Active Self  pioglitazone (ACTOS) 45 MG tablet [id number] Yes Take 1 tablet by mouth once daily Koberlein, Junell C, MD Taking Active   pravastatin (PRAVACHOL) 40 MG  tablet [id number] Yes Take 1 tablet (40 mg total) by mouth daily. Caren Macadam, MD Taking Active  Med Note (SUTPHIN, CHASIE F   Thu Sep 20, 2020 11:02 AM)    prednisoLONE acetate (PRED FORTE) 1 % ophthalmic suspension [id number] Yes Place 1 drop into both eyes See admin instructions. Place 1 drop into left eye three times daily Place 1 drop into right eye once daily [provider] Taking Active Self           Med Note Maud Deed   Wed Sep 19, 2020  6:10 PM) LF : 08/21/20 30 day supply  Probiotic Product (PROBIOTIC-10 PO) [id number] Yes Take 1 capsule by mouth daily. [provider] Taking Active Self  senna-docusate (SENOKOT-S) 8.6-50 MG tablet [id number] Yes Take 1 tablet by mouth at bedtime. Bary Leriche, [place] Taking Active             Patient Active Problem List   Diagnosis Date Noted   Leiomyoma 07/18/2020   Anxiety state    Acute blood loss anemia    Postoperative pain    Cervical myelopathy (HCC) 09/22/2019   S/P cervical spinal fusion 09/22/2019   AKI (acute kidney injury) ([place]) 09/14/2019   GERD (gastroesophageal reflux disease) 09/14/2019   Spinal stenosis in cervical region 09/14/2019   Weakness 09/14/2019   Numbness and tingling 09/14/2019   Cervical spinal stenosis 09/14/2019   Status post cataract extraction and insertion of intraocular lens of left eye 09/04/2019   Lumbar stenosis 08/24/2019   Persistent cough 06/06/2019   History of Descemet membrane endothelial keratoplasty (DMEK) 01/04/2019   Status post cataract extraction and insertion of intraocular lens of right eye 01/04/2019   Senile nuclear sclerosis 01/02/2019   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   Anatomical narrow angle, 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   Obesity, morbid, BMI 40.0-49.9 ([place]) 04/19/2018   Morbid obesity ([place]) 04/19/2018   Cataract    Fibroid    Endometriosis    DM (diabetes mellitus) type II controlled with renal manifestation ([place]) 06/17/2010    Dyslipidemia 06/17/2010   Microcytic anemia 06/17/2010   Essential hypertension 06/17/2010   Allergic rhinitis 06/17/2010   Osteoarthritis 06/17/2010   LOW BACK PAIN 06/17/2010   OSTEOPENIA 06/17/2010   BREAST CANCER, HX OF 06/17/2010   Disorder of skeletal system 06/17/2010    Immunization History  Administered Date(s) Administered   Fluad Quad(high Dose 65+) 04/28/2019, 03/21/2020, 03/19/2021   Influenza Split 04/30/2011, 04/28/2012, 04/13/2013   Influenza Whole 04/02/2010   Influenza, High Dose Seasonal PF 04/10/2015, 04/16/2016, 12/10/2016, 04/13/2017, 04/15/2018, 08/04/2018, 04/19/2019, 04/18/2020   Influenza,inj,Quad PF,6+ Mos 04/06/2014   PFIZER(Purple Top)SARS-COV-2 Vaccination 08/01/2019, 08/14/2019, 04/18/2020   PNEUMOCOCCAL CONJUGATE-20 05/13/2021   PPD Test 10/15/2010, 10/15/2010, 10/17/2010, 10/27/2011, 12/13/2012, 11/13/2015, 04/25/2019   Pneumococcal Conjugate-13 07/12/2014   Pneumococcal Polysaccharide-23 12/10/2016, 12/25/2017, 03/23/2019, 04/19/2019, 04/18/2020   Patient reports she does not usually take the metformin in the morning and sometimes skips it in the afternoon as well. She wondered if that was why her A1c increased.   Conditions to be addressed/monitored:  Hypertension, Hyperlipidemia, Diabetes, GERD, Anxiety, Osteopenia, Osteoarthritis and Allergic Rhinitis  Conditions addressed this visit: Diabetes, hypertension  Care Plan : CCM Pharmacy Care Plan  Updates made by Viona Gilmore, [facility] since 05/17/2021 12:00 AM     Problem: Problem: Hypertension, Hyperlipidemia, Diabetes, GERD, Anxiety, Osteopenia, Osteoarthritis and Allergic Rhinitis      Long-Range Goal: Patient-Specific Goal   Start Date: 10/29/2020  Expected End Date: 10/29/2021  Recent Progress: On track  Priority: High  Note:   Current Barriers:  Unable to independently monitor therapeutic efficacy Unable to  achieve control of cholesterol  Unable to maintain control of  diabetes  Pharmacist Clinical Goal(s):  Patient will achieve adherence to monitoring guidelines and medication adherence to achieve therapeutic efficacy achieve control of cholesterol as evidenced by next lipid panel  through collaboration with PharmD and provider.   Interventions: 1:1 collaboration with Caren Macadam, MD regarding development and update of comprehensive plan of care as evidenced by provider attestation and co-signature Inter-disciplinary care team collaboration (see longitudinal plan of care) Comprehensive medication review performed; medication list updated in electronic medical record  Hypertension (BP goal <140/90) -Uncontrolled -Current treatment: Benazepril 40 mg daily  Hydrochlorothiazide 12.5 mg daily   Metoprolol Succinate 50 mg daily  Spironolactone 25 mg 1/2 tablet daily - stopped taking it a few weeks ago  -Medications previously tried: amlodipine (hypotension) -Current home readings: 130/70s (not checking at home as she doesn't think her cuff is accurate) -Current dietary habits: increased water intake -Current exercise habits: did not discuss -Denies hypotensive/hypertensive symptoms -Educated on BP goals and benefits of medications for prevention of heart attack, stroke and kidney damage; Importance of home blood pressure monitoring; Proper BP monitoring technique; Symptoms of hypotension and importance of maintaining adequate hydration; -Counseled to monitor BP at home weekl, document, and provide log at future appointments -Counseled on diet and exercise extensively Recommended to continue current medication  Hyperlipidemia: (LDL goal < 70) -Uncontrolled -Current treatment: Pravastatin 40 mg daily at bedtime -Medications previously tried: none  -Current dietary patterns: did not discuss -Current exercise habits: did not discuss -Educated on Cholesterol goals;  Importance of limiting foods high in cholesterol; Exercise goal of 150 minutes  per week; -Counseled on diet and exercise extensively Recommended switch to high intensity statin for better LDL lowering  Diabetes (A1c goal <7%) -Controlled -Current medications: Glipizide XL 5 mg daily (AM) Metformin XR 500 mg 1 tablet twice daily - adjusts or takes it in the middle of the day Pioglitazone 45 mg daily (AM)  -Medications previously tried: Januvia (low blood sugars) -Current home glucose readings fasting glucose: 119 post prandial glucose: 150, 189 (before bedtime) -Denies hypoglycemic/hyperglycemic symptoms -Current meal patterns:  breakfast: did not discuss lunch: did not discuss  dinner: did not discuss  snacks: limiting fried foods and sweets drinks: did not discuss  -Current exercise: did not discuss -Educated on A1c and blood sugar goals; Prevention and management of hypoglycemic episodes; Benefits of routine self-monitoring of blood sugar; Carbohydrate counting and/or plate method -Counseled to check feet daily and get yearly eye exams -Counseled on diet and exercise extensively Recommended to continue current medication  Anxiety/spinal pain (Goal: minimize symptoms) -Controlled -Current treatment: Duloxetine 20 mg 1 capsule every other day -Medications previously tried/failed: n/a -PHQ9: 0 -GAD7: n/a -Educated on Benefits of medication for symptom control -Recommended to continue current medication  Osteoporosis (Goal prevent fractures) -Uncontrolled -Last DEXA Scan: 08/31/2018  T-Score femoral neck: -2.5  T-Score total hip:n/a  T-Score lumbar spine: -0.2  T-Score forearm radius: n/a  10-year probability of major osteoporotic fracture: n/a  10-year probability of hip fracture: n/a -Patient is a candidate for pharmacologic treatment due to T-Score < -2.5 in femoral neck -Current treatment  Vitamin D 4000 units daily -Medications previously tried: none  -Recommend [phone] units of vitamin D daily. Recommend 1200 mg of calcium daily from  dietary and supplemental sources. Recommend weight-bearing and muscle strengthening exercises for building and maintaining bone density. -Recommended to continue current medication Recommended repeat DEXA and vitamin D level  GERD (Goal: minimize symptoms) -  Controlled -Current treatment  Famotidine 20 mg at bedtime  Gaviscon Extra Relief 10 mL at bedtime  -Medications previously tried: omeprazole (not needed); famotidine 40 mg (dizziness)  -Recommended to continue current medication  Persistent cough (Goal: minimize coughing) -Controlled -Current treatment  Flonase 50 mcg/act 1-2 spray daily as needed Advair 250-50 mcg  1 puff twice daily Incentive spirometer -Medications previously tried: none  -Recommended to continue current medication   Health Maintenance -Vaccine gaps: shingrix -Current therapy:  APAP 500 mg as needed  Vitamin C 1000 mg daily  Vitamin D 2000 units daily  Lotrisone Cream Vitamin B12 2000 mcg daily  Epinephrine  Ferrex 150 mg daily as needed Fexofenadine 180 mg daily as needed (1/2 tablet) Multivitamin daily  Prednisolone 1% solution  Probiotic daily  Senna-Docusate 8.6-50 mg as needed -Educated on Cost vs benefit of each product must be carefully weighed by individual consumer -Patient is satisfied with current therapy and denies issues -Recommended to continue current medication  Patient Goals/Self-Care Activities Patient will:  - take medications as prescribed check glucose daily, document, and provide at future appointments check blood pressure weekly, document, and provide at future appointments target a minimum of 150 minutes of moderate intensity exercise weekly  Follow Up Plan: The care management team will reach out to the patient again over the next 30 days.           Medication Assistance: None required.  Patient affirms current coverage meets needs.  Compliance/Adherence/Medication fill history: Care Gaps: Tetanus, shingrix,  PAP smear, COVID booster Last A1C - 7.5 on 05/13/2021 BP - 110/60 (05/13/21)   Star-Rating Drugs: Glipizide 5 mg - Last filled 04/25/21 90 DS at [facility] Metformin 500 mg -  Last filled 11/09/20 90 DS at [facility] Pioglitazone 45 mg - Last filled 04/14/21 90 DS at [facility] Pravastatin 40 mg  - Last filled 03/31/21 90 [facility] at [facility]   Patient's preferred pharmacy is:  [place], [place] [address] Phone: [phone] Fax: [place], [address] [address] Phone: [phone] Fax: [phone]  Uses pill box? Yes  Pt endorses 99% compliance   We discussed: Current pharmacy is preferred with insurance plan and patient is satisfied with pharmacy services Patient decided to: Continue current medication management strategy  Care Plan and Follow Up Patient Decision:  Patient agrees to Care Plan and Follow-up.  Plan: Telephone follow up appointment with care management team member scheduled for:  3 months  [name], PharmD [place] Pharmacist [place] at [place] [phone]

## 2021-05-17 ENCOUNTER — Telehealth: Payer: Self-pay | Admitting: Family Medicine

## 2021-05-17 NOTE — Progress Notes (Addendum)
Patient assistance application for Rybelsus 7mg  once daily completed Mad[place] to print in office and mail to patient did not need to call per Pharmacist as patient is aware.  [place] Clinical Pharmacist Assistant [phone]

## 2021-05-19 DIAGNOSIS — I890 Lymphedema, not elsewhere classified: Secondary | ICD-10-CM | POA: Diagnosis not present

## 2021-05-20 ENCOUNTER — Telehealth: Payer: Self-pay | Admitting: Pharmacist

## 2021-05-20 ENCOUNTER — Other Ambulatory Visit: Payer: Self-pay | Admitting: Family Medicine

## 2021-05-20 ENCOUNTER — Encounter: Payer: Self-pay | Admitting: Family Medicine

## 2021-05-20 MED ORDER — RYBELSUS 3 MG PO TABS: 3.0000 mg | ORAL_TABLET | Freq: Every day | ORAL | 0 refills | Status: DC

## 2021-05-20 NOTE — Telephone Encounter (Signed)
-----   Message from Caren Macadam, MD sent at 05/20/2021  1:57 PM EST ----- Regarding: RE: CCM visit 11/16 follow up #I will send in rybelsus 3mg  daily.  #I think it would be great to stop the glipizide.  #I think at least once a day metformin is good.  #I would eventually like to stop the actos, but maybe we leave it on until we taper on the rybelsus since we are stopping the glipizide? #let's have her double the iron supplement 2 days per week, continue once daily other days. I would like to recheck cbc along with some other anemia labs in about 61months time.  ----- Message ----- From: Viona Gilmore, [facility] Sent: 05/17/2021  12:43 PM EST To: Caren Macadam, MD Subject: CCM visit 11/16 follow up                      Hi,  So when I spoke with her, she really did not want to try an injection but was willing to give Rybelsus a try although she is very worried about lows. Even though her A1c is > 7%, would you be ok with stopping the glipizide and sending in a prescription for Rybelsus 3 mg. I will call in a 1 time use coupon for it and work on patient assistance. She also doesn't take the metformin as prescribed, sometimes skipping it all together during the day. I would probably prefer her taking that over the Actos but do you want me to have to at least take it once a day for now?  For the iron, you are already prescribing an iron product that she gets via prescription from the pharmacy. Did you want to just increase that? She said she hasn't noticed side effects with it.  Let me know! Maddie

## 2021-05-20 NOTE — Telephone Encounter (Addendum)
Called the pharmacy to provide a 30 day trial coupon for Rybelsus and they confirmed a $0 copay.  Called patient to make her aware of the plan. Patient is aware that Rybelsus was sent into the pharmacy and this will replace her glipizide. Patient agreed to take 2 of the iron capsules for 2 days of the week and 1 capsule all other days. Updated medication list to reflect this change. Scheduled for lab appt for December for repeat CBC.   Follow up DM assessment in 1 month.

## 2021-05-21 ENCOUNTER — Other Ambulatory Visit: Payer: Self-pay | Admitting: Family Medicine

## 2021-05-21 DIAGNOSIS — D649 Anemia, unspecified: Secondary | ICD-10-CM

## 2021-05-28 ENCOUNTER — Other Ambulatory Visit: Payer: Self-pay | Admitting: Family Medicine

## 2021-05-28 DIAGNOSIS — Z947 Corneal transplant status: Secondary | ICD-10-CM | POA: Diagnosis not present

## 2021-05-28 DIAGNOSIS — H18513 Endothelial corneal dystrophy, bilateral: Secondary | ICD-10-CM | POA: Diagnosis not present

## 2021-05-29 DIAGNOSIS — E1129 Type 2 diabetes mellitus with other diabetic kidney complication: Secondary | ICD-10-CM

## 2021-05-29 DIAGNOSIS — M4802 Spinal stenosis, cervical region: Secondary | ICD-10-CM | POA: Diagnosis not present

## 2021-05-29 DIAGNOSIS — I10 Essential (primary) hypertension: Secondary | ICD-10-CM | POA: Diagnosis not present

## 2021-05-30 DIAGNOSIS — J3089 Other allergic rhinitis: Secondary | ICD-10-CM | POA: Diagnosis not present

## 2021-05-30 DIAGNOSIS — J301 Allergic rhinitis due to pollen: Secondary | ICD-10-CM | POA: Diagnosis not present

## 2021-05-31 ENCOUNTER — Other Ambulatory Visit: Payer: Self-pay | Admitting: Family Medicine

## 2021-06-05 ENCOUNTER — Other Ambulatory Visit: Payer: Self-pay

## 2021-06-05 ENCOUNTER — Ambulatory Visit: Payer: Medicare HMO | Attending: Orthopaedic Surgery

## 2021-06-05 DIAGNOSIS — I890 Lymphedema, not elsewhere classified: Secondary | ICD-10-CM | POA: Insufficient documentation

## 2021-06-05 DIAGNOSIS — Z483 Aftercare following surgery for neoplasm: Secondary | ICD-10-CM | POA: Diagnosis not present

## 2021-06-05 DIAGNOSIS — M25611 Stiffness of right shoulder, not elsewhere classified: Secondary | ICD-10-CM | POA: Diagnosis not present

## 2021-06-05 DIAGNOSIS — M6281 Muscle weakness (generalized): Secondary | ICD-10-CM | POA: Diagnosis not present

## 2021-06-05 NOTE — Therapy (Signed)
[facility] @ [place], [place], [address] Phone: [phone]   Fax:  [phone]  Physical Therapy Treatment  Patient Details  Name: Doris Jackson MRN: [id number] Date of Birth: [birth year] Referring Provider (PT): Dr. Griffin Basil   Encounter Date: 06/05/2021   PT End of Session - 06/05/21 1341     Visit Number 30    Number of Visits 31    Date for PT Re-Evaluation 07/29/21    PT Start Time 1300    PT Stop Time 1340    PT Time Calculation (min) 40 min    Activity Tolerance Patient tolerated treatment well    Behavior During Therapy [facility] for tasks assessed/performed             Past Medical History:  Diagnosis Date   ALLERGIC RHINITIS 06/17/2010   ANEMIA-NOS 06/17/2010   BREAST CANCER, HX OF 06/17/2010   Breast cancer, right ([place]) 2008   Cataract    Cervical spondylosis with myelopathy and radiculopathy 09/15/2019   COPD 06/17/2010   pt denies   DIABETES MELLITUS, TYPE II 06/17/2010   Endometriosis    Fibroid    FIBROID   GERD (gastroesophageal reflux disease)    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   HYPERLIPIDEMIA 06/17/2010   HYPERTENSION 06/17/2010   Leg swelling    LOW BACK PAIN 06/17/2010   OSTEOARTHRITIS 06/17/2010   OSTEOPENIA 06/17/2010   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   Weakness     Past Surgical History:  Procedure Laterality Date   ABDOMINAL HYSTERECTOMY  1988   TAH,LSO   ANTERIOR CERVICAL DECOMP/DISCECTOMY FUSION N/A 09/15/2019   Procedure: Cervical five CorpectomyANTERIOR CERVICAL DECOMPRESSION/DISCECTOMY FUSION CERVICAL THREE-CERVICAL Four;  Surgeon: Ashok Pall, MD;  Location: [facility];  Service: Neurosurgery;  Laterality: N/A;   BREAST LUMPECTOMY Right 2007   LUMPECTOMY FOLLOWED BY RADIATION   COLONOSCOPY     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   Subjective Assessment - 06/05/21 1300     Subjective I've not adjusted to the [place] slide yet, but my daughter helps me with the sleeve. Every now and then my husband helps me. I am washing the sleeve about 2 times a week. I have been using the Flexitouch every day mostly from 8:30-9:30.  I get the sleeve on about 11:30 .  I bought the It stays but sometimes I have trouble with it to. The sleeve has been pretty comfortable.    Pertinent History Pt is s/p right breast lumpectomy with radiation in 2007.  She has had some swelling for a number of years, but has never been treated.  She suffered a fall at [facility] in March and fractured her righ shoulder resulting in a Right reverse total Shoulder.  After that she noticed more swelling in the right UE and was referred  by Dr. Griffin Basil for lymphedema treatment. She is also diabetic and has hypertension. She has LBP, prior cervical fusion, OA. She is using a rollator    Patient Stated Goals Decrease swelling in arm, gain information from provider    Currently in Pain? No/denies                   LYMPHEDEMA/ONCOLOGY QUESTIONNAIRE - 06/05/21 0001       Right Upper Extremity Lymphedema   15 cm Proximal to Olecranon Process 38.5 cm    10 cm Proximal to Olecranon Process 42 cm    Olecranon Process 28 cm    15 cm Proximal to Ulnar Styloid Process 26.8 cm    10 cm Proximal to Ulnar Styloid Process 24.6 cm    Just Proximal to Ulnar Styloid Process 18.8 cm    Across Hand at  [employer] 19.5 cm    At [place] of 2nd Digit 6.4 cm      Left Upper Extremity Lymphedema   15 cm Proximal to Olecranon Process 42.4 cm    10 cm Proximal to Olecranon Process 43 cm    Olecranon Process 26.8 cm    15 cm Proximal to Ulnar Styloid Process 26.7 cm    10 cm Proximal to Ulnar Styloid Process 24.9 cm    Just Proximal to Ulnar Styloid Process 19 cm    Across Hand at [employer] 20 cm    At [place] of 2nd Digit 6.4 cm                                      PT Long Term Goals - 06/05/21 1347       PT LONG TERM GOAL #1   Title Pt will have decreased edema  at 15 cm prox to ulna and olecranon by 2-4 cm    Time 6    Period Weeks    Status Achieved    Target Date 06/05/21      PT LONG TERM GOAL #2   Title Pts husband will be independent with compression bandaging to help reduce right UE swelling.    Time 6    Period Weeks    Status Deferred      PT LONG TERM GOAL #3   Title Pt will be fit for appropiate compression garments and will be independent with husbands help in their use    Time 6    Period Weeks    Status Achieved    Target Date 06/05/21      PT LONG TERM GOAL #4   Title Pt will be educated in skin care, and precautions for lymphedema    Time 6    Period Weeks    Status Achieved    Target Date 06/05/21                   Plan - 06/05/21 1341     Clinical Impression Statement Pt returns for follow up. her daughter has been helping her don the sleeve, but they have not used the EZ slide or It stays.  Her knee high compression stockings had slid way down and she was shown how to use It stays to keep them up.  Her arms were remeasured and she has had good reduction in the Right UE.  She has been compliant with using her Flexi-touch pump and wearing her sleeve, although she  should probably wash the sleeve a little more often. I demonstrated the EZ slide on her arm and she was able to easily pull her sleeve up.  She thought  she could remember how to do it, and have her daughter try it as well. I showed her how to use it Stays on her sleeve as well to keep it from rolling down. Pt. verbalized understanding and feels she can be released to independent self management now with the help of her husband and daughter as needed for donning sleeve. She has achieved all goals established.    Personal Factors and Comorbidities Age;Comorbidity 3+    Comorbidities Right lumpectomy with SLNB and radiation, Recent Reverse right TSA, Cervical Fusion, LBP, OA,hypertension    Examination-Activity Limitations Lift;Reach Overhead;Locomotion Level    Stability/Clinical Decision Making Stable/Uncomplicated    Rehab Potential Good    PT Duration 12 weeks    PT Treatment/Interventions ADLs/Self Care Home Management;Therapeutic exercise;Manual techniques;Orthotic Fit/Training;Patient/family education;Manual lymph drainage;Compression bandaging;Scar mobilization;Passive range of motion    PT Next Visit Plan pt. discharged to [facility] and Agree with Plan of Care Patient             Patient will benefit from skilled therapeutic intervention in order to improve the following deficits and impairments:  Decreased range of motion, Decreased scar mobility, Decreased strength, Increased edema, Postural dysfunction, Impaired UE functional use  Visit Diagnosis: Lymphedema, not elsewhere classified  Stiffness of right shoulder, not elsewhere classified  Muscle weakness (generalized)  Aftercare following surgery for neoplasm     Problem List Patient Active Problem List   Diagnosis Date Noted   Leiomyoma 07/18/2020   Anxiety state    Acute blood loss anemia    Postoperative pain    Cervical myelopathy ([place]) 09/22/2019   S/P cervical spinal fusion 09/22/2019   AKI (acute kidney injury) ([place]) 09/14/2019   GERD (gastroesophageal reflux disease) 09/14/2019   Spinal stenosis in cervical region 09/14/2019   Weakness 09/14/2019    Numbness and tingling 09/14/2019   Cervical spinal stenosis 09/14/2019   Status post cataract extraction and insertion of intraocular lens of left eye 09/04/2019   Lumbar stenosis 08/24/2019   Persistent cough 06/06/2019   History of Descemet membrane endothelial keratoplasty (DMEK) 01/04/2019   Status post cataract extraction and insertion of intraocular lens of right eye 01/04/2019   Senile nuclear sclerosis 01/02/2019   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   Anatomical narrow angle, 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   Obesity, morbid, BMI 40.0-49.9 ([place]) 04/19/2018   Morbid obesity ([place]) 04/19/2018   Cataract    Fibroid    Endometriosis    DM (diabetes mellitus) type II controlled with renal manifestation ([place]) 06/17/2010   Dyslipidemia 06/17/2010   Microcytic anemia 06/17/2010   Essential hypertension 06/17/2010   Allergic rhinitis 06/17/2010   Osteoarthritis 06/17/2010   LOW BACK PAIN 06/17/2010   OSTEOPENIA 06/17/2010   BREAST CANCER, HX OF 06/17/2010   Disorder of skeletal system 06/17/2010  PHYSICAL THERAPY DISCHARGE SUMMARY  Visits from Start of Care: 30  Current functional level related to goals / functional outcomes: Achieved all goals   Remaining deficits: NA   Education / Equipment:Compression sleeve/glove, Flexitouch compression pump  Patient agrees to discharge. Patient goals were met. Patient is being discharged due to meeting the stated rehab goals.   Claris Pong, PT 06/05/2021, 1:50 PM  [facility] @ [place] [place], [place], [address] Phone: [phone]   Fax:  [phone]  Name: Doris Jackson MRN: [id number] Date of Birth: 1940-12-21

## 2021-06-05 NOTE — Patient Outreach (Signed)
Aging Gracefully Program  06/05/2021  Doris Jackson 1941-03-18 [id number]   [facility] Evaluation Interviewer attempted to call patient on today regarding Aging Gracefully referral. No answer from patient after multiple rings. CMA left confidential voicemail for patient to return call.  Will attempt to call back within 1 week.   [place] Management Assistant [phone]

## 2021-06-12 ENCOUNTER — Other Ambulatory Visit: Payer: Self-pay

## 2021-06-12 NOTE — Patient Outreach (Signed)
Aging Gracefully Program  06/12/2021  Doris Jackson [birth year] [id number]   [facility] Evaluation Interviewer made contact with patient. Aging Gracefully survey completed.   Interviewer will send referral to Tomasa Rand, RN and OT for follow up.  [place] Management Assistant  [phone]

## 2021-06-17 ENCOUNTER — Telehealth: Payer: Self-pay | Admitting: Pharmacist

## 2021-06-17 NOTE — Progress Notes (Signed)
Chronic Care Management Pharmacy Assistant   Name: RIANA TESSMER  MRN: [id number] DOB: 1941-05-25  Reason for Encounter: Disease State   Conditions to be addressed/monitored: DMII  Recent office visits:  None  Recent consult visits:  05/28/21 [name], MD  (Ophthalmology) - Patient presented for Fuchs corneal dystrophy of both eyes and other concerns. No medication changes notes.  Hospital visits:  None in previous 6 months  Medications: Outpatient Encounter Medications as of 06/17/2021  Medication Sig Note   Ascorbic Acid (VITAMIN C) 1000 MG tablet Take 1,000 mg by mouth daily.    benazepril (LOTENSIN) 40 MG tablet Take 1 tablet by mouth once daily    Blood Glucose Monitoring Suppl (ONETOUCH VERIO REFLECT) w/Device KIT USE UP TO FOUR TIMES DAILY AS DIRECTED.    Cholecalciferol (VITAMIN D) 2000 UNITS CAPS Take 4,000 Units by mouth daily.     cyanocobalamin 2000 MCG tablet Take 2,000 mcg by mouth daily.    famotidine (PEPCID) 40 MG tablet Take 1 tablet (40 mg total) by mouth at bedtime.    FERREX 150 150 MG capsule TAKE 1 CAPSULE BY MOUTH ONCE DAILY ** PATIENT NEEDS AN APPOINTMENT ** (Patient taking differently: 2 capsules 2 days of the week and one capsule all other days)    fexofenadine (ALLEGRA) 180 MG tablet Take 180 mg by mouth daily as needed for allergies.    fluticasone (FLONASE) 50 MCG/ACT nasal spray Place 1 spray into both nostrils daily.    furosemide (LASIX) 20 MG tablet Take 1 tablet (20 mg total) by mouth daily as needed.    glucose blood (ONETOUCH VERIO) test strip USE  STRIP TO CHECK GLUCOSE 2-4 times daily as needed for glucose control    hydrochlorothiazide (HYDRODIURIL) 12.5 MG tablet Take 1 tablet by mouth once daily    Lancets 30G MISC USE TWICE DAILY AS DIRECTED FOR GLUCOSE TESTING AND MONITORING    Lancets MISC Use as directed    metFORMIN (GLUCOPHAGE XR) 500 MG 24 hr tablet Take 1 tablet (500 mg total) by mouth 2 (two) times daily.     metoprolol succinate (TOPROL-XL) 50 MG 24 hr tablet TAKE 1 TABLET BY MOUTH ONCE DAILY **TAKE  WITH  OR  IMMEDIATELY  FOLLOWING  A  MEAL**  PATIENTS  NEEDS  AN  APPOINTMENT**    ONE TOUCH LANCETS MISC Test twice daily.    pioglitazone (ACTOS) 45 MG tablet Take 1 tablet by mouth once daily    pravastatin (PRAVACHOL) 40 MG tablet Take 1 tablet by mouth once daily    prednisoLONE acetate (PRED FORTE) 1 % ophthalmic suspension Place 1 drop into both eyes See admin instructions. Place 1 drop into left eye three times daily Place 1 drop into right eye once daily 09/19/2020: LF : 08/21/20 30 day supply   Probiotic Product (PROBIOTIC-10 PO) Take 1 capsule by mouth daily.    Semaglutide (RYBELSUS) 3 MG TABS Take 3 mg by mouth daily.    senna-docusate (SENOKOT-S) 8.6-50 MG tablet Take 1 tablet by mouth at bedtime.    No facility-administered encounter medications on file as of 06/17/2021.  Recent Relevant Labs: Lab Results  Component Value Date/Time   HGBA1C 7.5 (H) 05/13/2021 01:53 PM   HGBA1C 6.6 (H) 09/19/2020 08:08 AM   MICROALBUR <0.7 05/13/2021 01:53 PM   MICROALBUR 0.7 03/21/2020 11:45 AM    Kidney Function Lab Results  Component Value Date/Time   CREATININE 1.39 (H) 05/13/2021 01:53 PM   CREATININE 1.24 (  H) 09/24/2020 05:24 PM   CREATININE 1.50 (H) 04/03/2020 09:13 AM   CREATININE 1.43 (H) 03/21/2020 11:45 AM   CREATININE 0.8 04/20/2012 11:11 AM   GFR 36.14 (L) 05/13/2021 01:53 PM   GFRNONAA 45 (L) 09/24/2020 05:24 PM   GFRAA >60 10/21/2019 12:50 PM    Current antihyperglycemic regimen:  Glipizide XL 5 mg daily (AM) Metformin XR 500 mg 1 tablet twice daily - adjusts or takes it in the middle of the day Pioglitazone 45 mg daily (AM)  What recent interventions/DTPs have been made to improve glycemic control:  Patient reports she had started the Rybelsus  and has been since in contact with Dr Ethlyn Gallery about stopping it because her sugars were less regulated and the side effects she was  experiencing, (tiredness) Have there been any recent hospitalizations or ED visits since last visit with [facility]? None Patient denies hypoglycemic symptoms, including None Patient denies hyperglycemic symptoms, including none What are your blood sugars ranging?  Fasting: 06/15/21 fasting was 90 was higher with the Rybellsus During the week, how often does your blood glucose drop below 70? Never Notes Patient reports she was in contact with Dr. Inocente Salles this past Friday and they agreed for her to return to her prior diabetic medication as she did not think her sugars were regulated well and she had not been feeling her best with the change to the Rybelsus.  Adherence Review: Is the patient currently on a STATIN medication? Yes Is the patient currently on ACE/ARB medication? Yes Does the patient have >5 day gap between last estimated fill dates? No   Care Gaps: BP - 110/60 (05/13/21) AWV - 11/13/20 TDAP - Overdue Zoster Vaccine - Overdue PAP Sm - Overdue COVID Booster - Overdue Lab Results  Component Value Date   HGBA1C 7.5 (H) 05/13/2021    Star Rating Drugs: Glipizide 5 mg - Last filled 04/25/21 90 DS at [facility] Metformin 500 mg -  Last filled 05/31/21 90 DS at [facility] Pioglitazone 45 mg - Last filled 04/14/21 90 DS at [facility] Pravastatin 40 mg  - Last filled 03/31/21 90 DS at [facility]    Patient Assistance: Rybelsus 2022 - patient reports no longer taking   [place] Pharmacist Assistant [phone]

## 2021-06-18 DIAGNOSIS — I890 Lymphedema, not elsewhere classified: Secondary | ICD-10-CM | POA: Diagnosis not present

## 2021-06-19 DIAGNOSIS — J301 Allergic rhinitis due to pollen: Secondary | ICD-10-CM | POA: Diagnosis not present

## 2021-06-19 DIAGNOSIS — J3089 Other allergic rhinitis: Secondary | ICD-10-CM | POA: Diagnosis not present

## 2021-06-25 ENCOUNTER — Other Ambulatory Visit (INDEPENDENT_AMBULATORY_CARE_PROVIDER_SITE_OTHER): Payer: Medicare HMO

## 2021-06-25 DIAGNOSIS — D649 Anemia, unspecified: Secondary | ICD-10-CM | POA: Diagnosis not present

## 2021-06-25 LAB — CBC WITH DIFFERENTIAL/PLATELET
Basophils Absolute: 0 10*3/uL (ref 0.0–0.1)
Basophils Relative: 0.6 % (ref 0.0–3.0)
Eosinophils Absolute: 0.1 10*3/uL (ref 0.0–0.7)
Eosinophils Relative: 2 % (ref 0.0–5.0)
HCT: 31.4 % — ABNORMAL LOW (ref 36.0–46.0)
Hemoglobin: 10.2 g/dL — ABNORMAL LOW (ref 12.0–15.0)
Lymphocytes Relative: 26.9 % (ref 12.0–46.0)
Lymphs Abs: 1.7 10*3/uL (ref 0.7–4.0)
MCHC: 32.5 g/dL (ref 30.0–36.0)
MCV: 79 fl (ref 78.0–100.0)
Monocytes Absolute: 0.7 10*3/uL (ref 0.1–1.0)
Monocytes Relative: 10.6 % (ref 3.0–12.0)
Neutro Abs: 3.7 10*3/uL (ref 1.4–7.7)
Neutrophils Relative %: 59.9 % (ref 43.0–77.0)
Platelets: 137 10*3/uL — ABNORMAL LOW (ref 150.0–400.0)
RBC: 3.98 Mil/uL (ref 3.87–5.11)
RDW: 17.9 % — ABNORMAL HIGH (ref 11.5–15.5)
WBC: 6.2 10*3/uL (ref 4.0–10.5)

## 2021-06-25 LAB — IBC + FERRITIN
Ferritin: 34.7 ng/mL (ref 10.0–291.0)
Iron: 50 ug/dL (ref 42–145)
Saturation Ratios: 13.6 % — ABNORMAL LOW (ref 20.0–50.0)
TIBC: 368.2 ug/dL (ref 250.0–450.0)
Transferrin: 263 mg/dL (ref 212.0–360.0)

## 2021-06-25 LAB — FOLATE: Folate: 14 ng/mL (ref >5.9)

## 2021-06-25 LAB — VITAMIN B12: Vitamin B-12: 485 pg/mL (ref 211–911)

## 2021-06-29 DIAGNOSIS — M4802 Spinal stenosis, cervical region: Secondary | ICD-10-CM | POA: Diagnosis not present

## 2021-07-03 ENCOUNTER — Other Ambulatory Visit: Payer: Self-pay

## 2021-07-03 ENCOUNTER — Other Ambulatory Visit: Payer: Self-pay | Admitting: Occupational Therapy

## 2021-07-03 NOTE — Patient Instructions (Signed)
Goals Addressed             This Visit's Progress    Patient Stated       She would like to feel more safe in her bathroom (2 grab bars in bath tub) and securing her toilet to floor would help this.     Patient Stated       She would like to be easier for her to vacuum. (A light weight stick vacuum would help with this)     Patient Stated       She would like to know strategies for potential better sleep.

## 2021-07-03 NOTE — Patient Outreach (Signed)
Aging Gracefully Program  OT Initial Visit  07/03/2021  Doris Jackson [birth year] [id number]  Visit:  1- Initial Visit  Start Time:  1100 End Time:  8502 Total Minutes:  17  CCAP: Typical Daily Routine: Typical Daily Routine:: Gets up and does IADLs around the house. Only goes out for store and doctor appointments. What Types Of Care Problems Are You Having Throughout The Day?: Taking care of IADLs What Kind Of Help Do You Receive?: husband and dtr help some with IADLs Do You Think You Need Other Types Of Help?: Need help with IADLs (bathing, kitchen and bathroom cleaning, dusting) What Do You Think Would Make Everyday Life Easier For You?: Help with IADLs Do You Have Time For Yourself?: yes Patient Reported Equipment: Patient Reported Equipment Currently Used: Rollator, Tub Bench Functional Mobility-Maintain Balance While Showering: Maintaining Balance While Showering: Moderate Difficulty Do You:: Use Personal Assistance Importance Of Learning New Strategies:: A Little Intervention: Yes Other Comments:: two grab bars in bath tub Functional Mobility-Stooping, Crouching, Kneeling To Retreive Item: Stooping, Crouching, or Kneeling To Retrieve Item:  (has a Secondary school teacher) Functional Mobility-Reaching For Items Above Shoulder Level: Reaching For Items Above Shoulder Level: Moderate Difficulty (has recently finished therapy) Functional Mobility-Climb 1 Flight Of Stairs: Climb 1 Flight Of Stairs: A Little Difficulty (has to use rail) Functional Mobility-Move In And Out Of Bed: Move In and Out Of Bed: A Little Difficulty (has a step stool with handle) Functional Mobility-Move In And Out Of Bath/Shower: Move In And Out Of A Bath/Shower: A Little Difficulty (uses a tub bench) Activities of Daily Living-Rest And Sleep: Rest and Sleep: A Little Difficulty (sleeps off and on at night)  Readiness To Change Score:  Readiness to Change Score: 9.33  Home Environment Assessment: Outside Home  Entry:: The back door is the main entrance that is used. Wooden rails that are not stable. Front steps metal rail is not stable as well. Bathroom:: [employer] is low for Mr. Chenette, there is not a grab bar at the toilet. There is a tub/shower combonation, no grab bars, no hand held shower. Master bath toilet is loose. Master bath is a tub/shower combonation with one temporary grab bar.  Goals:  Goals Addressed             This Visit's Progress    Patient Stated       She would like to feel more safe in her bathroom (2 grab bars in bath tub) and securing her toilet to floor would help this.     Patient Stated       She would like to be easier for her to vacuum. (A light weight stick vacuum would help with this)     Patient Stated       She would like to know strategies for potential better sleep.        Post Clinical Reasoning: Clinician View Of Client Situation:: [place] gets along rather well in her house with only occasionally using an AD in the house. Uses an AD when out and about (rollator) or pushes a buggy at the store. She is able to do all of her basic ADLs with increased time (except cannot put on a bra). She has a reacher, sock aid, RW, rollator, tub bench, tub seat, and a quad cane. 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 She is currently not active in any social activities in the community. Their church is in  [place]. She does the best she can wtih IADLs in the home. Client View Of His/Her Situation:: She feels she does pretty well in the home. She feels she could really benefit from someone coming in and helping with IADLs. She feels going out and dong anything outside the house would be a strain on her since she has so much to do around the house and has to do it at her own pace. She feels she could do her own vacuumming if she had a light weight vacuum. Next Visit Plan:: look into a light weight vacuum   [place], OTR/L Aging  Gracefully [phone]

## 2021-07-05 ENCOUNTER — Other Ambulatory Visit: Payer: Self-pay

## 2021-07-05 NOTE — Patient Outreach (Addendum)
Aging Gracefully Program  07/05/2021  Doris Jackson 08/27/1940 [id number]   Placed call to patient and explained reason for call. Offered home visit for patient and husband on 07/15/2021 at 48.  Appointment was accepted and address confirmed.  Tomasa Rand RN, BSN, Careers information officer for [employer] Mobile: [phone]

## 2021-07-10 ENCOUNTER — Other Ambulatory Visit: Payer: Self-pay | Admitting: Family Medicine

## 2021-07-10 DIAGNOSIS — J3089 Other allergic rhinitis: Secondary | ICD-10-CM | POA: Diagnosis not present

## 2021-07-10 DIAGNOSIS — J301 Allergic rhinitis due to pollen: Secondary | ICD-10-CM | POA: Diagnosis not present

## 2021-07-15 ENCOUNTER — Telehealth: Payer: Self-pay | Admitting: Family Medicine

## 2021-07-15 ENCOUNTER — Other Ambulatory Visit: Payer: Self-pay

## 2021-07-15 NOTE — Patient Instructions (Signed)
Goals Addressed               This Visit's Progress     Patient Stated (pt-stated)        Aging Gracefully RN  Goal: Patient will report being able to get things accomplished at home in a timely manner, compression machine, CBG monitoring in the next 4 months.  07/15/2021 Assessment: Reviewed difficulty getting things accomplished at home.  Reports little energy.  Interventions: reviewed with patient about doing important things first. Like compression machine, CBG monitoring , meds and meals. Encouraged patient to talk to MD about embedded case manager and social worker to assist. Plan: follow up home visit 08/19/2021  Tomasa Rand RN, BSN, CEN RN Case Manager for [place] Mobile: [phone]       Patient Stated (pt-stated)        Aging Gracefully RN  Goal:  Patient will report no falls in the next 4 months  07/15/2021 Assessment: reviewed previous falls with injury.  Reviewed equipment at home, 4 prong cane and walker. Rolling walker in the car. Reports dizziness and feeling off balanced at times. Interventions:  talked about reason for falls. Reviewed bathroom safety, throw rugs and wrinkled carpets. Will mail fall prevention handout. Plan:  next home visit planned for 08/19/2021  Tomasa Rand RN, BSN, CEN RN Case Manager for [place] Mobile: [phone]

## 2021-07-15 NOTE — Telephone Encounter (Signed)
Pt is calling and would like dr Jenean [place]au assistant her get set up with PCA or CNA to come to her house 1 or 2 days a week for about 2 hrs each to assist her with personal needs such as bathing, light housekeeping to assist with vacuuming, dusting, meal preparation . Pt was seen today by [facility] nurse and she suggestion she may be able to talk with social worker imbedded case Freight forwarder

## 2021-07-15 NOTE — Patient Outreach (Signed)
Aging Gracefully Program  RN Visit  07/15/2021  Doris Jackson [birth year] [id number]  Visit:   Aging Gracefully RN home visit #1  Start Time:   1130 End Time:   1230 Total Minutes:   60  Readiness To Change Score:     Universal RN Interventions: Calendar Distribution: Yes Medications: Yes Medication Changes: No Mood: Yes Pain: Yes PCP Advocacy/Support: Yes Fall Prevention: Yes Incontinence: Yes Clinician View Of Client Situation: Wrinkled rugs in the living room. Noted chair in den where client stays. All needed things in reach of patient. Compression machine nopted on couch, Daughter present. Husband present.  walking slowly. Cane and walker noted in den. Client talks fast. Client View Of His/Her Situation: Reports she would like help at home.  Healthcare Provider Communication: Did Higher education careers adviser With [employer] Provider?: No According to Client, Did PCP Report Communication With An Aging Gracefully RN?: No  Clinician View of Client Situation: Clinician View Of Client Situation: Wrinkled rugs in the living room. Noted chair in den where client stays. All needed things in reach of patient. Compression machine nopted on couch, Daughter present. Husband present.  walking slowly. Cane and walker noted in den. Client talks fast. Client's View of His/Her Situation: Client View Of His/Her Situation: Reports she would like help at home.  Medication Assessment: Do You Have Any Problems Paying For Medications?: No Where Does Client Store Medications?: Other: (den) Can Client Read Pill Bottles?: Yes Does Client Use A Pillbox?: Yes Does Anyone Assist Client In Filling Pillbox?: No Does Anyone Assist Client In Taking Medications?: No Do You Take Vitamin D?: Yes Does Client Have Any Questions Or Concerns About Medictions?: No Is Client Complaining Of Any Symptoms That Could Be Side Effects To Medications?: No Any Possible Changes In Medication Regimen?: No  Outpatient  Encounter Medications as of 07/15/2021  Medication Sig Note   benazepril (LOTENSIN) 40 MG tablet Take 1 tablet by mouth once daily    Blood Glucose Monitoring Suppl (ONETOUCH VERIO REFLECT) w/Device KIT USE UP TO FOUR TIMES DAILY AS DIRECTED.    Cholecalciferol (VITAMIN D) 2000 UNITS CAPS Take 4,000 Units by mouth daily.     cyanocobalamin 2000 MCG tablet Take 2,000 mcg by mouth daily.    famotidine (PEPCID) 40 MG tablet Take 1 tablet (40 mg total) by mouth at bedtime.    FERREX 150 150 MG capsule TAKE 1 CAPSULE BY MOUTH ONCE DAILY ** PATIENT NEEDS AN APPOINTMENT ** (Patient taking differently: 2 capsules 2 days of the week and one capsule all other days)    fexofenadine (ALLEGRA) 180 MG tablet Take 180 mg by mouth daily as needed for allergies.    fluticasone (FLONASE) 50 MCG/ACT nasal spray Place 1 spray into both nostrils daily.    furosemide (LASIX) 20 MG tablet Take 1 tablet (20 mg total) by mouth daily as needed.    glipiZIDE (GLUCOTROL) 5 MG tablet Take 5 mg by mouth daily before breakfast.    glucose blood (ONETOUCH VERIO) test strip USE  STRIP TO CHECK GLUCOSE 2-4 times daily as needed for glucose control    hydrochlorothiazide (HYDRODIURIL) 12.5 MG tablet Take 1 tablet by mouth once daily    Lancets 30G MISC USE TWICE DAILY AS DIRECTED FOR GLUCOSE TESTING AND MONITORING    Lancets MISC Use as directed    metFORMIN (GLUCOPHAGE) 500 MG tablet Take 500 mg by mouth 2 (two) times daily with a meal.    metoprolol succinate (TOPROL-XL) 50 MG 24 hr tablet  TAKE 1 TABLET BY MOUTH ONCE DAILY **TAKE  WITH  OR  IMMEDIATELY  FOLLOWING  A  MEAL**  PATIENTS  NEEDS  AN  APPOINTMENT**   ° ONE TOUCH LANCETS MISC Test twice daily.   ° pioglitazone (ACTOS) 45 MG tablet TAKE 1 TABLET BY MOUTH ONCE DAILY ---PT  NEEDS  AN  APPT  WITH  PCP   ° pravastatin (PRAVACHOL) 40 MG tablet Take 1 tablet by mouth once daily   ° prednisoLONE acetate (PRED FORTE) 1 % ophthalmic suspension Place 1 drop into both eyes See admin  instructions. Place 1 drop into left eye three times daily Place 1 drop into right eye once daily 09/19/2020: LF : 08/21/20 30 day supply  ° Probiotic Product (PROBIOTIC-10 PO) Take 1 capsule by mouth daily.   ° Ascorbic Acid (VITAMIN C) 1000 MG tablet Take 1,000 mg by mouth daily. (Patient not taking: Reported on 07/15/2021)   ° metFORMIN (GLUCOPHAGE XR) 500 MG 24 hr tablet Take 1 tablet (500 mg total) by mouth 2 (two) times daily.   ° Semaglutide (RYBELSUS) 3 MG TABS Take 3 mg by mouth daily. (Patient not taking: Reported on 07/15/2021)   ° senna-docusate (SENOKOT-S) 8.6-50 MG tablet Take 1 tablet by mouth at bedtime. (Patient not taking: Reported on 07/15/2021)   ° °No facility-administered encounter medications on file as of 07/15/2021.  ° °OT Update: pending home assessment by [facility] ° °Session Summary: Patient with a lot of health issues. DM appears to be managed well. Reports she takes all her medications as prescribed. Home compression machine for lymphedema.  ° ° Goals Addressed   ° °  °  °  °  °  ° This Visit's Progress  °   Patient Stated (pt-stated)     °   Aging Gracefully RN ° °Goal: Patient will report being able to get things accomplished at home in a timely manner, compression machine, CBG monitoring in the next 4 months. ° °07/15/2021 °Assessment: Reviewed difficulty getting things accomplished at home.  Reports little energy.  °Interventions: reviewed with patient about doing important things first. Like compression machine, CBG monitoring , meds and meals. Encouraged patient to talk to MD about embedded case manager and social worker to assist. °Plan: follow up home visit 08/19/2021 ° °  RN, BSN, CEN °RN Case Manager for Aging Gracefully °[facility] °Mobile: [phone]  °  °   Patient Stated (pt-stated)     °   Aging Gracefully RN ° °Goal:  Patient will report no falls in the next 4 months ° °07/15/2021 °Assessment: reviewed previous falls with injury.   Reviewed equipment at home, 4 prong cane and walker. Rolling walker in the car. Reports dizziness and feeling off balanced at times. °Interventions:  talked about reason for falls. Reviewed bathroom safety, throw rugs and wrinkled carpets. Will mail fall prevention handout. °Plan:  next home visit planned for 08/19/2021 ° °  RN, BSN, CEN °RN Case Manager for Aging Gracefully °[facility] °Mobile: [phone]  °  ° °  °  ° ° °

## 2021-07-16 NOTE — Telephone Encounter (Signed)
I am happy to do that. I do think she will need an officially documented visit to get that approved through insurance. Virtual would be fine; just needs to be a visit where we document what the needs are.

## 2021-07-16 NOTE — Telephone Encounter (Signed)
Patient informed of the message below and an appt was scheduled for 1/20.

## 2021-07-19 ENCOUNTER — Encounter: Payer: Self-pay | Admitting: Family Medicine

## 2021-07-19 ENCOUNTER — Telehealth (INDEPENDENT_AMBULATORY_CARE_PROVIDER_SITE_OTHER): Payer: Medicare HMO | Admitting: Family Medicine

## 2021-07-19 DIAGNOSIS — I10 Essential (primary) hypertension: Secondary | ICD-10-CM | POA: Diagnosis not present

## 2021-07-19 DIAGNOSIS — M545 Low back pain, unspecified: Secondary | ICD-10-CM

## 2021-07-19 DIAGNOSIS — E785 Hyperlipidemia, unspecified: Secondary | ICD-10-CM | POA: Diagnosis not present

## 2021-07-19 DIAGNOSIS — M159 Polyosteoarthritis, unspecified: Secondary | ICD-10-CM | POA: Diagnosis not present

## 2021-07-19 DIAGNOSIS — I890 Lymphedema, not elsewhere classified: Secondary | ICD-10-CM

## 2021-07-19 DIAGNOSIS — E1129 Type 2 diabetes mellitus with other diabetic kidney complication: Secondary | ICD-10-CM

## 2021-07-19 DIAGNOSIS — R531 Weakness: Secondary | ICD-10-CM | POA: Diagnosis not present

## 2021-07-19 NOTE — Progress Notes (Signed)
Virtual Visit via Telephone Note  I connected with Doris Jackson on 07/19/21 at 11:30 AM EST by telephone and verified that I am speaking with the correct person using two identifiers.   I discussed the limitations, risks, security and privacy concerns of performing an evaluation and management service by telephone and the availability of in person appointments. I also discussed with the patient that there may be a patient responsible charge related to this service. The patient expressed understanding and agreed to proceed.  Location patient: home Location provider:  [facility]  [place], [place] [address]  Participants present for the call: patient, provider Patient did not have a visit in the prior 7 days to address this/these issue(s).   History of Present Illness: Lymphedema machine takes up a lot of her time. She is supposed to do it 1 hour/day.   Does have low back pain. Worse with increased standing (like cooking). Does get up twice at night at least to go to the bathroom and uses topical cream. Hasn't followed back up with doc for this. Worse with bad weather. She does ok in house without walker, but uses roller walker when out of house. Goes out about once a month. Uses a lot of plastic/paper so she doesn't have to do dishes. Uses grocery cart for walking when in store.   She would like someone to help her fix a meal. She has a hard time with doing full meals herself.   She needs help with being able to take complete bath. She wears out easily.   Energy level is a little better since doubling up on iron. She thinks [place] couple of hours a couple of days/week would be helpful.  She also is involved in housing solution program- program to help with getting needs med for lower income households. Community housing through [employer]. She was told they would check to make sure things like railings, bathrooms are safe.   Sometimes legs will  feel weak; usually just with starting to walk. Arms are better since surgery; mobility is much better. Just hard to move posteriorly. But not feeling weak. Harder to do hair, esp with right arm.   No issues with breathing. Does get a spasm in abdomen about once a month.      Observations/Objective: Patient sounds cheerful and well on the phone. I do not appreciate any SOB. Speech and thought processing are grossly intact. Patient reported vitals: none today  Assessment and Plan: 1. Low back pain, unspecified back pain laterality, unspecified chronicity, unspecified whether sciatica present Patient would benefit from services to assist her with routine activities of independent daily living.  She needs help with obtaining a complete bath.  The home is already being evaluated for safety measures and she does have chronic care coordination on board.  She would benefit from help with meal prep and light housework. - Ambulatory referral to Home Health  2. Lymphedema of right arm - Ambulatory referral to Home Health  3. Essential hypertension - Ambulatory referral to [place]  4. 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 Primary osteoarthritis involving multiple joints - Ambulatory referral to Home Health  6. Dyslipidemia - Ambulatory referral to Home Health  7. Obesity, morbid, BMI 40.0-49.9 ([place]) - Ambulatory referral to Home Health  8. Weakness - Ambulatory referral to Home Health   Follow  Up Instructions:   [id number] 5-10 [id number] 11-20 9443 21-30 I did not refer this patient for an OV in the next 24 hours for this/these issue(s).  I discussed the assessment and treatment plan with the patient. The patient was provided an opportunity to ask questions and all were answered. The patient agreed with the plan and demonstrated an understanding of the  instructions.   The patient was advised to call back or seek an in-person evaluation if the symptoms worsen or if the condition fails to improve as anticipated.  I provided 20 minutes of non-face-to-face time during this encounter.   Doris Rough, MD

## 2021-07-22 ENCOUNTER — Other Ambulatory Visit: Payer: Self-pay | Admitting: Family Medicine

## 2021-07-29 DIAGNOSIS — J301 Allergic rhinitis due to pollen: Secondary | ICD-10-CM | POA: Diagnosis not present

## 2021-07-29 DIAGNOSIS — J3089 Other allergic rhinitis: Secondary | ICD-10-CM | POA: Diagnosis not present

## 2021-08-16 ENCOUNTER — Ambulatory Visit: Payer: Medicare HMO | Admitting: Podiatry

## 2021-08-19 ENCOUNTER — Other Ambulatory Visit: Payer: Self-pay | Admitting: Family Medicine

## 2021-08-19 ENCOUNTER — Other Ambulatory Visit: Payer: Self-pay

## 2021-08-19 DIAGNOSIS — I890 Lymphedema, not elsewhere classified: Secondary | ICD-10-CM | POA: Diagnosis not present

## 2021-08-19 NOTE — Patient Instructions (Signed)
Visit Information   Goals Addressed               This Visit's Progress     Patient Stated (pt-stated)        Aging Gracefully RN  Goal: Patient will report being able to get things accomplished at home in a timely manner, compression machine, CBG monitoring in the next 4 months.  07/15/2021 Assessment: Reviewed difficulty getting things accomplished at home.  Reports little energy.  Interventions: reviewed with patient about doing important things first. Like compression machine, CBG monitoring , meds and meals. Encouraged patient to talk to MD about embedded case manager and social worker to assist. Plan: follow up home visit 08/19/2021  Tomasa Rand RN, BSN, CEN RN Case Manager for [facility] Mobile: [phone]    08/19/2021 Assessment:   Patient reports she continues to have problems getting things done at home.  Reports she is not wearing her compression hose and not using her compression hose but 2-3 times per week. Reports she it tired.   Interventions: reviewed with patient that she did talk to MD about needs at home.  Encouraged patient to pace herself when trying to get things done at home. Encouraged patient to use her compression hose and compression machine.  Plan: will follow up in 1 month.  Tomasa Rand RN, BSN, Careers information officer for [facility] [facility] Mobile: [phone]  Plan:      Patient Stated (pt-stated)        Aging Gracefully RN  Goal:  Patient will report no falls in the next 4 months  07/15/2021 Assessment: reviewed previous falls with injury.  Reviewed equipment at home, 4 prong cane and walker. Rolling walker in the car. Reports dizziness and feeling off balanced at times. Interventions:  talked about reason for falls. Reviewed bathroom safety, throw rugs and wrinkled carpets. Will mail fall prevention handout. Plan:  next home visit planned for 08/19/2021  Tomasa Rand RN, BSN, CEN RN Case  Manager for [facility] Mobile: [phone]   08/19/2021 Assessment:  No falls.   Interventions: Reviewed home exercise plans. Provided exercise plan. Plan:  Follow up in 1 month.  Tomasa Rand RN, BSN, Careers information officer for [employer] Mobile: [phone]

## 2021-08-19 NOTE — Patient Outreach (Signed)
PAging Gracefully Program  RN Visit  08/19/2021  Doris Jackson 30-Jul-1940 [id number]  Visit:   RN home visit #2  Start Time:   1110 End Time:   1200 Total Minutes:   50 minutes  Readiness To Change Score:     Universal RN Interventions: Calendar Distribution: Yes Medications: Yes Medication Changes: Yes Mood: Yes Pain: Yes PCP Advocacy/Support: Yes Fall Prevention: Yes Incontinence: Yes Clinician View Of Client Situation: Patient forgot her appointment.  right arm swelling noted. . Not curently wearing her compression hose. Client View Of His/Her Situation: reports not using machine for several days.   Reports she went out of town to church yesterday.  Reports [facility] came to do assessment. .  Denies any falls. Has eye doctor appointment March 6.  Dental appointment  08/22/2021.  Healthcare Provider Communication: Did Higher education careers adviser With [employer] Provider?: No According to Client, Did PCP Report Communication With An Aging Gracefully RN?: No  Clinician View of Client Situation: Clinician View Of Client Situation: Patient forgot her appointment.  right arm swelling noted. . Not curently wearing her compression hose. Client's View of His/Her Situation: Client View Of His/Her Situation: reports not using machine for several days.   Reports she went out of town to church yesterday.  Reports [facility] came to do assessment. .  Denies any falls. Has eye doctor appointment March 6.  Dental appointment  08/22/2021.  Medication Assessment: no changes per patient    OT Update: pending contracts signatures.  Session Summary: Patient is doing well. Reports CBG today of 91.  Reports she continues to have problems with edema.     Goals Addressed               This Visit's Progress     Patient Stated (pt-stated)        Aging Gracefully RN  Goal: Patient will report being able to get things accomplished at home in a timely manner,  compression machine, CBG monitoring in the next 4 months.  07/15/2021 Assessment: Reviewed difficulty getting things accomplished at home.  Reports little energy.  Interventions: reviewed with patient about doing important things first. Like compression machine, CBG monitoring , meds and meals. Encouraged patient to talk to MD about embedded case manager and social worker to assist. Plan: follow up home visit 08/19/2021  Tomasa Rand RN, BSN, CEN RN Case Manager for [place] Mobile: [phone]    08/19/2021 Assessment:   Patient reports she continues to have problems getting things done at home.  Reports she is not wearing her compression hose and not using her compression hose but 2-3 times per week. Reports she it tired.   Interventions: reviewed with patient that she did talk to MD about needs at home.  Encouraged patient to pace herself when trying to get things done at home. Encouraged patient to use her compression hose and compression machine.  Plan: will follow up in 1 month.  Tomasa Rand RN, BSN, Careers information officer for [facility] Mobile: [phone]  Plan:      Patient Stated (pt-stated)        Aging Gracefully RN  Goal:  Patient will report no falls in the next 4 months  07/15/2021 Assessment: reviewed previous falls with injury.  Reviewed equipment at home, 4 prong cane and walker. Rolling walker in the car. Reports dizziness and feeling off balanced at times. Interventions:  talked about reason for falls. Reviewed bathroom  safety, throw rugs and wrinkled carpets. Will mail fall prevention handout. Plan:  next home visit planned for 08/19/2021  Tomasa Rand RN, BSN, CEN RN Case Manager for [place] [facility] Mobile: [phone]   08/19/2021 Assessment:  No falls.   Interventions: Reviewed home exercise plans. Provided exercise plan. Plan:  Follow up in 1 month.  Tomasa Rand  RN, BSN, Careers information officer for [employer] Mobile: [phone]         Tomasa Rand RN, BSN, Careers information officer for [employer] Mobile: [phone]

## 2021-08-20 DIAGNOSIS — J3089 Other allergic rhinitis: Secondary | ICD-10-CM | POA: Diagnosis not present

## 2021-08-20 DIAGNOSIS — J301 Allergic rhinitis due to pollen: Secondary | ICD-10-CM | POA: Diagnosis not present

## 2021-08-21 ENCOUNTER — Other Ambulatory Visit: Payer: Self-pay | Admitting: Family Medicine

## 2021-08-22 ENCOUNTER — Other Ambulatory Visit: Payer: Self-pay | Admitting: Family Medicine

## 2021-08-29 ENCOUNTER — Telehealth: Payer: Self-pay | Admitting: Pharmacist

## 2021-08-29 NOTE — Chronic Care Management (AMB) (Signed)
? ? ?Chronic Care Management ?Pharmacy Assistant  ? ?Name: Doris Jackson  MRN: [id number] DOB: 1941-03-16 ?08/29/21 APPOINTMENT REMINDER ?Call to patient to remind of 09/02/21 phone call appt with [name], patient advised that date would no longer be good with her schedule. Offered 4/10 she accepted. ? ? ?Care Gaps: ?BP- 110/60 ( 05/13/21) ?Pap smear - Overdue ?COVID Booster - Overdue ?CCM- 4/23 ?AWV-  5/22 ? ?Star Rating Drug: ?Glipizide 5 mg - Last filled 04/25/21 90 DS at [facility] ?Metformin 500 mg -  Last filled 05/31/21 90 DS at [facility] ?Pioglitazone 45 mg - Last filled 04/14/21 90 DS at [facility] ?Pravastatin 40 mg  - Last filled 06/26/21 90 DS at [facility]  ?  ? ?Any gaps in medications fill history? ? ?Attempted to verify fill history several times could not get through to pharm ? ?SIG:   ? ? ?Medications: ?Outpatient Encounter Medications as of 08/29/2021  ?Medication Sig Note  ? Ascorbic Acid (VITAMIN C) 1000 MG tablet Take 1,000 mg by mouth daily.   ? benazepril (LOTENSIN) 40 MG tablet Take 1 tablet by mouth once daily   ? Blood Glucose Monitoring Suppl (ONETOUCH VERIO REFLECT) w/Device KIT USE UP TO FOUR TIMES DAILY AS DIRECTED.   ? Cholecalciferol (VITAMIN D) 2000 UNITS CAPS Take 4,000 Units by mouth daily.    ? cyanocobalamin 2000 MCG tablet Take 2,000 mcg by mouth daily.   ? famotidine (PEPCID) 40 MG tablet Take 1 tablet (40 mg total) by mouth at bedtime.   ? FERREX 150 150 MG capsule TAKE 1 CAPSULE BY MOUTH ONCE DAILY ** PATIENT NEEDS AN APPOINTMENT ** (Patient taking differently: 2 capsules 2 days of the week and one capsule all other days)   ? fexofenadine (ALLEGRA) 180 MG tablet Take 180 mg by mouth daily as needed for allergies.   ? fluticasone (FLONASE) 50 MCG/ACT nasal spray Place 1 spray into both nostrils daily.   ? furosemide (LASIX) 20 MG tablet Take 1 tablet (20 mg total) by mouth daily as needed.   ? glipiZIDE (GLUCOTROL XL) 5 MG 24 hr tablet Take 1 tablet by mouth once daily   ?  hydrochlorothiazide (HYDRODIURIL) 12.5 MG tablet Take 1 tablet by mouth once daily   ? Lancets 30G MISC USE TWICE DAILY AS DIRECTED FOR GLUCOSE TESTING AND MONITORING   ? Lancets MISC Use as directed   ? metFORMIN (GLUCOPHAGE XR) 500 MG 24 hr tablet Take 1 tablet (500 mg total) by mouth 2 (two) times daily.   ? metFORMIN (GLUCOPHAGE) 500 MG tablet Take 500 mg by mouth 2 (two) times daily with a meal.   ? metoprolol succinate (TOPROL-XL) 50 MG 24 hr tablet TAKE 1 TABLET BY MOUTH ONCE DAILY ---TAKE  WITH  OR  IMMEDIATELY  FOLLOWING  A  MEAL  --PT  NEEDS  AN  APPOINTMENT   ? ONE TOUCH LANCETS MISC Test twice daily.   ? ONETOUCH VERIO test strip USE STRIP TO CHECK GLUCOSE 2-4 TIMES DAILY AS NEEDED FOR GLUCOSE CONTROL   ? pioglitazone (ACTOS) 45 MG tablet TAKE 1 TABLET BY MOUTH ONCE DAILY ---PT  NEEDS  AN  APPT  WITH  PCP   ? pravastatin (PRAVACHOL) 40 MG tablet Take 1 tablet by mouth once daily   ? prednisoLONE acetate (PRED FORTE) 1 % ophthalmic suspension Place 1 drop into both eyes See admin instructions. Place 1 drop into left eye three times daily Place 1 drop into right eye once daily 09/19/2020: LF :  08/21/20 30 day supply  ? Probiotic Product (PROBIOTIC-10 PO) Take 1 capsule by mouth daily.   ? Semaglutide (RYBELSUS) 3 MG TABS Take 3 mg by mouth daily.   ? senna-docusate (SENOKOT-S) 8.6-50 MG tablet Take 1 tablet by mouth at bedtime.   ? ?No facility-administered encounter medications on file as of 08/29/2021.  ? ? ? ? ? ?Ned Clines CMA ?Clinical Pharmacist Assistant ?[phone] ? ?

## 2021-08-30 ENCOUNTER — Other Ambulatory Visit: Payer: Self-pay | Admitting: Family Medicine

## 2021-09-02 ENCOUNTER — Telehealth: Payer: Medicare HMO

## 2021-09-06 ENCOUNTER — Telehealth: Payer: Medicare HMO

## 2021-09-07 ENCOUNTER — Other Ambulatory Visit: Payer: Self-pay | Admitting: Occupational Therapy

## 2021-09-07 ENCOUNTER — Other Ambulatory Visit: Payer: Self-pay

## 2021-09-07 NOTE — Patient Instructions (Addendum)
She would like to be easier for her to vacuum. (A light weight stick vacuum would help with this). ? ?ACTION PLANNING ?Target Problem Area: ?vacuuming  ?Why Problem May Occur: ?Current vacuum too heavy  ?Target Goal: ?Easier vacuuming  ? ?STRATEGIES ?Saving Your Energy: ?DO: DON'T:  ?Use smaller vacuum provided. ? Use larger vacuum  ?Do one to two rooms and rest   ? ?Simplifying the way you set up tasks or daily routines: ?DO: DON'T:  ?Make a list of one household task to do day   ?   ? ?Practice ?It is important to practice the strategies so we can determine if they will be effective in helping to reach your goal. ?Follow these specific recommendations: ?1.Use the vacuum to get use to using it ? ?If a strategy does not work the first time, try it again and again (and maybe again). ?We may make some changes over the next few sessions, based on how they work. ? ?Golden Circle, OTR/L       09/07/2021 ? ? ? ? ?ACTION PLANNING ?Target Problem Area: ?Sleeping  ?Why Problem May Occur: ?Various reasons  ?Target Goal: ?Better Sleep  ? ?STRATEGIES ? ?Modifying your home environment: ?DO: DON'T:  ?Make sure room is quite and dark Have too much light (but enough to see to get up to bathroom safely at nigh)  ?   ? ?Practice ?It is important to practice the strategies so we can determine if they will be effective in helping to reach your goal. ?Follow these specific recommendations: ?1.Try one new sleep strategy at a time for several days before switching to another strategy. ? ?If a strategy does not work the first time, try it again and again (and maybe again). ?We may make some changes over the next few sessions, based on how they work. ? ?[place] Circle, OTR/L       09/07/2021 ? ? ? ? ?

## 2021-09-07 NOTE — Patient Outreach (Signed)
Aging Gracefully Program ? ?OT Follow-Up Visit ? ?09/07/2021 ? ?Doris Jackson ?1941-04-17 ?[id number] ? ?Visit:  2- Second Visit ? ?Start Time:  1130 ?End Time:  1215 ?Total Minutes:  45 ? ?Readiness to Change Score :  Readiness to Change Score: 10 ? ?Durable Medical Equipment: ?Adaptive Equipment: Other (light weight vacuum) ?Adaptive Equipment Distribution Date: 09/07/21 ? ?Patient Education: ?Education Provided: Yes ?Education Details: Sleep handouts ?Person(s) Educated: Patient ?Comprehension: Verbalized Understanding ? ?Goals:  ? Goals Addressed   ? ?  ?  ?  ?  ? This Visit's Progress  ?  COMPLETED: Patient Stated     ?  She would like to be easier for her to vacuum. (A light weight stick vacuum would help with this). ? ?ACTION PLANNING ?Target Problem Area: ?vacuuming  ?Why Problem May Occur: ?Current vacuum too heavy  ?Target Goal: ?Easier vacuuming  ?STRATEGIES ?Saving Your Energy: ?DO: DON'T:  ?Use smaller vacuum provided. ? Use larger vacuum  ?Do one to two rooms and rest   ?Simplifying the way you set up tasks or daily routines: ?DO: DON'T:  ?Make a list of one household task to do day   ?   ?Practice ?It is important to practice the strategies so we can determine if they will be effective in helping to reach your goal. ?Follow these specific recommendations: ?1.Use the vacuum to get use to using it ? ?If a strategy does not work the first time, try it again and again (and maybe again). ?We may make some changes over the next few sessions, based on how they work. ? ?Golden Circle, OTR/L       09/07/2021 ? ? ? ?  ?  COMPLETED: Patient Stated     ?  She would like to know strategies for potential better sleep. ? ?ACTION PLANNING ?Target Problem Area: ?Sleeping  ?Why Problem May Occur: ?Various reasons  ?Target Goal: ?Better Sleep  ?STRATEGIES ? ?Modifying your home environment: ?DO: DON'T:  ?Make sure room is quite and dark Have too much light (but enough to see to get up to bathroom safely at nigh)  ?    ?Practice ?It is important to practice the strategies so we can determine if they will be effective in helping to reach your goal. ?Follow these specific recommendations: ?1.Try one new sleep strategy at a time for several days before switching to another strategy. ? ?If a strategy does not work the first time, try it again and again (and maybe again). ?We may make some changes over the next few sessions, based on how they work. ? ?Golden Circle, OTR/L       09/07/2021 ?  ? ?  ? ? ?Post Clinical Reasoning: ?Client Action (Goal) Two Interventions: Client provided with light weight vacuum and instructed in its use as well as given instruction pamphlet that came with it ?Did Client Try?: Yes ?Targeted Problem Area Status: A Lot Better ?Client Action (Goal) Three Interventions: Client given handouts on sleep and I verbally went over all of them with her ?Did Client Try?: No ?Reason Client Did Not Try?: Other (she will decide if she will try any of the recommendations and implement if so) ?Targeted Problem Area Status:  (TBD) ?Clinician View Of Client Situation:: [name] still works on taking care of the house as she is able. She still reports her husband would like for her to be more active outside the house. She was appreciative of the light weight vacuum cleaner and the sleep handouts. ?  Client View Of His/Her Situation:: She feels she does well and was happy with the light weight vacuum and [place] going over the sleep handouts. ?Next Visit Plan:: Going over getting up from a fall and safety checklist at home. ? ?Golden Circle, OTR/L ?Aging Gracefully ?[phone] ? ? ?

## 2021-09-09 ENCOUNTER — Other Ambulatory Visit: Payer: Self-pay

## 2021-09-09 NOTE — Patient Instructions (Signed)
Visit Information ? ? Goals Addressed   ? ?  ?  ?  ?  ?  ? This Visit's Progress  ?   Patient Stated (pt-stated)     ?   Aging Gracefully RN ? ?Goal: Patient will report being able to get things accomplished at home in a timely manner, compression machine, CBG monitoring in the next 4 months. ? ?07/15/2021 ?Assessment: Reviewed difficulty getting things accomplished at home.  Reports little energy.  ?Interventions: reviewed with patient about doing important things first. Like compression machine, CBG monitoring , meds and meals. Encouraged patient to talk to MD about embedded case manager and social worker to assist. ?Plan: follow up home visit 08/19/2021 ? ?Tomasa Rand RN, BSN, CEN ?RN Case Manager for Aging Gracefully ?[place] ?Mobile: [phone]  ? ? ?08/19/2021 ?Assessment:   Patient reports she continues to have problems getting things done at home.  Reports she is not wearing her compression hose and not using her compression hose but 2-3 times per week. Reports she it tired.   ?Interventions: reviewed with patient that she did talk to MD about needs at home.  Encouraged patient to pace herself when trying to get things done at home. Encouraged patient to use her compression hose and compression machine. ? ?Plan: will follow up in 1 month. ? ?Tomasa Rand RN, BSN, CEN ?RN Case Manager for Aging Gracefully ?[place] ?Mobile: [phone]  ? ? ?09/09/2021 ?Assessment:  reports she is doing well.  CBG 130 today. Normally running 80-100.   Using compression machine 3 times per week. Goal is 7 times per week.  House clean and picked up today.  C/O back pain. ? ?Interventions: Reviewed time management ? ?Plan: Follow up in 6 weeks. ? ?Tomasa Rand RN, BSN, CEN ?RN Case Manager for Aging Gracefully ?[place] ?Mobile: [phone]  ?  ?   Patient Stated (pt-stated)     ?   Aging Gracefully RN ? ?Goal:  Patient will report no falls in the next 4  months ? ?07/15/2021 ?Assessment: reviewed previous falls with injury.  Reviewed equipment at home, 4 prong cane and walker. Rolling walker in the car. Reports dizziness and feeling off balanced at times. ?Interventions:  talked about reason for falls. Reviewed bathroom safety, throw rugs and wrinkled carpets. Will mail fall prevention handout. ?Plan:  next home visit planned for 08/19/2021 ? ?Tomasa Rand RN, BSN, CEN ?RN Case Manager for Aging Gracefully ?[place] ?Mobile: [phone]  ? ?08/19/2021 ?Assessment:  No falls.   ?Interventions: Reviewed home exercise plans. Provided exercise plan. ?Plan:  Follow up in 1 month. ? ?Tomasa Rand RN, BSN, CEN ?RN Case Manager for Aging Gracefully ?[place] ?Mobile: [phone]  ? ?09/09/2021 ?Assessment:  no falls since last home visit. States she is doing her home exercises 2 times per week. ?Interventions: Encouraged patient to do her daily exercises. ?Plan: follow up in 6 weeks. ? ?Tomasa Rand RN, BSN, CEN ?RN Case Manager for Aging Gracefully ?[place] ?Mobile: [phone]  ?  ? ?  ?  ? ? ?

## 2021-09-09 NOTE — Patient Outreach (Signed)
Aging Gracefully Program ? ?RN Visit ? ?09/09/2021 ? ?Doris Jackson ?[birth year] ?[id number] ? ?Visit:   RN home visit #3 ? ?Start Time:   1100 ?End Time:   6063 ?Total Minutes:   45 minutes ? ?Readiness To Change Score:    ? ?Universal RN Interventions: ?Calendar Distribution: Yes ?Exercise Review: Yes ?Medications: Yes ?Medication Changes: Yes ?Mood: Yes ?Pain: Yes ?PCP Advocacy/Support: Yes ?Fall Prevention: Yes ?Incontinence: Yes ?Clinician View Of Client Situation: Patient sitting on sofa.   Home neat and clean. Awake and alert. ?Client View Of His/Her Situation: Reports using compression machine 2-3 times per week.  Denies any falls.  Unable to go to eye appointment due to insurance problems.   Went to dental appointment and had a root canal.  Reports she is having worsening gastric reflux. ? ?Healthcare Provider Communication: ?Did Higher education careers adviser With [employer] Provider?: No ?According to Client, Did PCP Report Communication With An Aging Gracefully RN?: No ? ?Clinician View of Client Situation: ?Clinician View Of Client Situation: Patient sitting on sofa.   Home neat and clean. Awake and alert. ?Client's View of His/Her Situation: ?Client View Of His/Her Situation: Reports using compression machine 2-3 times per week.  Denies any falls.  Unable to go to eye appointment due to insurance problems.   Went to dental appointment and had a root canal.  Reports she is having worsening gastric reflux. ? ?Medication Assessment: denies any changes ?  ? ?OT Update: pending home modifications ? ?Session Summary:Patient doing well. Patient reports she is waiting for home modifications.  ? Goals Addressed   ? ?  ?  ?  ?  ?  ? This Visit's Progress  ?   Patient Stated (pt-stated)     ?   Aging Gracefully RN ? ?Goal: Patient will report being able to get things accomplished at home in a timely manner, compression machine, CBG monitoring in the next 4 months. ? ?07/15/2021 ?Assessment: Reviewed difficulty getting things  accomplished at home.  Reports little energy.  ?Interventions: reviewed with patient about doing important things first. Like compression machine, CBG monitoring , meds and meals. Encouraged patient to talk to MD about embedded case manager and social worker to assist. ?Plan: follow up home visit 08/19/2021 ? ?Tomasa Rand RN, BSN, CEN ?RN Case Manager for Aging Gracefully ?[place] ?Mobile: [phone]  ? ? ?08/19/2021 ?Assessment:   Patient reports she continues to have problems getting things done at home.  Reports she is not wearing her compression hose and not using her compression hose but 2-3 times per week. Reports she it tired.   ?Interventions: reviewed with patient that she did talk to MD about needs at home.  Encouraged patient to pace herself when trying to get things done at home. Encouraged patient to use her compression hose and compression machine. ? ?Plan: will follow up in 1 month. ? ?Tomasa Rand RN, BSN, CEN ?RN Case Manager for Aging Gracefully ?[place] ?Mobile: [phone]  ? ? ?09/09/2021 ?Assessment:  reports she is doing well.  CBG 130 today. Normally running 80-100.   Using compression machine 3 times per week. Goal is 7 times per week.  House clean and picked up today.  C/O back pain. ? ?Interventions: Reviewed time management ? ?Plan: Follow up in 6 weeks. ? ?Tomasa Rand RN, BSN, CEN ?RN Case Manager for Aging Gracefully ?[place] ?Mobile: [phone]  ?  ?   Patient Stated (pt-stated)     ?  Aging Gracefully RN ? ?Goal:  Patient will report no falls in the next 4 months ? ?07/15/2021 ?Assessment: reviewed previous falls with injury.  Reviewed equipment at home, 4 prong cane and walker. Rolling walker in the car. Reports dizziness and feeling off balanced at times. ?Interventions:  talked about reason for falls. Reviewed bathroom safety, throw rugs and wrinkled carpets. Will mail fall prevention handout. ?Plan:  next home visit planned for  08/19/2021 ? ?Tomasa Rand RN, BSN, CEN ?RN Case Manager for Aging Gracefully ?[place] ?Mobile: [phone]  ? ?08/19/2021 ?Assessment:  No falls.   ?Interventions: Reviewed home exercise plans. Provided exercise plan. ?Plan:  Follow up in 1 month. ? ?Tomasa Rand RN, BSN, CEN ?RN Case Manager for Aging Gracefully ?[place] ?Mobile: [phone]  ? ?09/09/2021 ?Assessment:  no falls since last home visit. States she is doing her home exercises 2 times per week. ?Interventions: Encouraged patient to do her daily exercises. ?Plan: follow up in 6 weeks. ? ?Tomasa Rand RN, BSN, CEN ?RN Case Manager for Aging Gracefully ?[place] ?Mobile: [phone]  ?  ? ?  ? Tomasa Rand RN, BSN, CEN ?RN Case Manager for Aging Gracefully ?[place] ?Mobile: [phone]  ? ? ?

## 2021-09-10 DIAGNOSIS — J3081 Allergic rhinitis due to animal (cat) (dog) hair and dander: Secondary | ICD-10-CM | POA: Diagnosis not present

## 2021-09-10 DIAGNOSIS — J301 Allergic rhinitis due to pollen: Secondary | ICD-10-CM | POA: Diagnosis not present

## 2021-09-10 DIAGNOSIS — J3089 Other allergic rhinitis: Secondary | ICD-10-CM | POA: Diagnosis not present

## 2021-09-16 ENCOUNTER — Other Ambulatory Visit: Payer: Self-pay | Admitting: Family Medicine

## 2021-09-20 ENCOUNTER — Telehealth: Payer: Self-pay

## 2021-09-20 NOTE — Telephone Encounter (Signed)
Pt requested to see if appt could be moved to an earlier time. Unable to find any appointment availability with PCP prior to 10/28/21. Pt verb understanding. Advised that if pt is having acute needs to schedule appt with any available provider prior to 10/28/21 if needed. Pt verb understanding. ?

## 2021-09-25 DIAGNOSIS — J301 Allergic rhinitis due to pollen: Secondary | ICD-10-CM | POA: Diagnosis not present

## 2021-09-25 DIAGNOSIS — J3089 Other allergic rhinitis: Secondary | ICD-10-CM | POA: Diagnosis not present

## 2021-10-01 DIAGNOSIS — S42201D Unspecified fracture of upper end of right humerus, subsequent encounter for fracture with routine healing: Secondary | ICD-10-CM | POA: Diagnosis not present

## 2021-10-02 DIAGNOSIS — E11319 Type 2 diabetes mellitus with unspecified diabetic retinopathy without macular edema: Secondary | ICD-10-CM | POA: Diagnosis not present

## 2021-10-02 DIAGNOSIS — J301 Allergic rhinitis due to pollen: Secondary | ICD-10-CM | POA: Diagnosis not present

## 2021-10-02 DIAGNOSIS — J3089 Other allergic rhinitis: Secondary | ICD-10-CM | POA: Diagnosis not present

## 2021-10-02 DIAGNOSIS — J3081 Allergic rhinitis due to animal (cat) (dog) hair and dander: Secondary | ICD-10-CM | POA: Diagnosis not present

## 2021-10-02 LAB — HM DIABETES EYE EXAM

## 2021-10-04 ENCOUNTER — Telehealth: Payer: Self-pay | Admitting: Pharmacist

## 2021-10-04 NOTE — Chronic Care Management (AMB) (Signed)
? ? ?Chronic Care Management ?Pharmacy Assistant  ? ?Name: Doris Jackson  MRN: [id number] DOB: 02/23/1941 ? ?Reason for Encounter: Cancel Patient assistance for Rybelsus per Thurmond Butts ? ? ?Recent office visits:  ?None ? ?Recent consult visits:  ?09/09/21 Thana Ates, RN Patient had visit with Aging Gracefully ? ?Hospital visits:  ?None in previous 6 months ? ?Medications: ?Outpatient Encounter Medications as of 10/04/2021  ?Medication Sig Note  ? Ascorbic Acid (VITAMIN C) 1000 MG tablet Take 1,000 mg by mouth daily.   ? benazepril (LOTENSIN) 40 MG tablet Take 1 tablet by mouth once daily   ? Blood Glucose Monitoring Suppl (ONETOUCH VERIO REFLECT) w/Device KIT USE UP TO FOUR TIMES DAILY AS DIRECTED.   ? Cholecalciferol (VITAMIN D) 2000 UNITS CAPS Take 4,000 Units by mouth daily.    ? cyanocobalamin 2000 MCG tablet Take 2,000 mcg by mouth daily.   ? famotidine (PEPCID) 40 MG tablet TAKE 1 TABLET BY MOUTH AT BEDTIME   ? FERREX 150 150 MG capsule TAKE 1 CAPSULE BY MOUTH ONCE DAILY ** PATIENT NEEDS AN APPOINTMENT ** (Patient taking differently: 2 capsules 2 days of the week and one capsule all other days)   ? fexofenadine (ALLEGRA) 180 MG tablet Take 180 mg by mouth daily as needed for allergies.   ? fluticasone (FLONASE) 50 MCG/ACT nasal spray Place 1 spray into both nostrils daily.   ? furosemide (LASIX) 20 MG tablet Take 1 tablet (20 mg total) by mouth daily as needed.   ? glipiZIDE (GLUCOTROL XL) 5 MG 24 hr tablet Take 1 tablet by mouth once daily   ? hydrochlorothiazide (HYDRODIURIL) 12.5 MG tablet Take 1 tablet by mouth once daily   ? Lancets 30G MISC USE TWICE DAILY AS DIRECTED FOR GLUCOSE TESTING AND MONITORING   ? Lancets MISC Use as directed   ? metFORMIN (GLUCOPHAGE) 500 MG tablet Take 500 mg by mouth 2 (two) times daily with a meal.   ? metFORMIN (GLUCOPHAGE-XR) 500 MG 24 hr tablet Take 1 tablet by mouth twice daily   ? metoprolol succinate (TOPROL-XL) 50 MG 24 hr tablet TAKE 1 TABLET BY MOUTH ONCE  DAILY ---TAKE  WITH  OR  IMMEDIATELY  FOLLOWING  A  MEAL  --PT  NEEDS  AN  APPOINTMENT   ? ONE TOUCH LANCETS MISC Test twice daily.   ? ONETOUCH VERIO test strip USE STRIP TO CHECK GLUCOSE 2-4 TIMES DAILY AS NEEDED FOR GLUCOSE CONTROL   ? pioglitazone (ACTOS) 45 MG tablet TAKE 1 TABLET BY MOUTH ONCE DAILY ---PT  NEEDS  AN  APPT  WITH  PCP   ? pravastatin (PRAVACHOL) 40 MG tablet Take 1 tablet by mouth once daily   ? prednisoLONE acetate (PRED FORTE) 1 % ophthalmic suspension Place 1 drop into both eyes See admin instructions. Place 1 drop into left eye three times daily Place 1 drop into right eye once daily 09/19/2020: LF : 08/21/20 30 day supply  ? Probiotic Product (PROBIOTIC-10 PO) Take 1 capsule by mouth daily.   ? Semaglutide (RYBELSUS) 3 MG TABS Take 3 mg by mouth daily.   ? senna-docusate (SENOKOT-S) 8.6-50 MG tablet Take 1 tablet by mouth at bedtime.   ? ?No facility-administered encounter medications on file as of 10/04/2021.  ?Notes: ?Call to [employer] to cancel patients enrollment for Rybelsys per [employer]. ? ?Care Gaps: ?BP- 110/60 ( 05/13/21) ?Pap smear - Overdue ?COVID Booster - Overdue ?CCM- 4/23 ?AWV-  5/22 ? ?Star Rating Drugs: ?Glipizide  5 mg - Last filled 08/23/21 90 DS at [facility] ?Metformin 500 mg -  Last filled 05/31/21 90 DS at [facility] to verify with pharm ?Pioglitazone 45 mg - Last filled 07/10/21 90 DS at [facility] ?Pravastatin 40 mg  - Last filled 09/18/21 90 DS at [facility]  ? ? ? ?Ned Clines CMA ?Clinical Pharmacist Assistant ?[phone] ? ?

## 2021-10-05 NOTE — Progress Notes (Signed)
APPOINTMENT REMINDER ? ? ? ?Patient  was reminded to have all medications, supplements and any blood glucose and blood pressure readings available for review with [name], Pharm. D, for telephone visit on 10/07/21 at 3. ? ? ? ?Ned Clines CMA ?Clinical Pharmacist Assistant ?[phone] ?  ?

## 2021-10-07 ENCOUNTER — Ambulatory Visit (INDEPENDENT_AMBULATORY_CARE_PROVIDER_SITE_OTHER): Payer: Medicare (Managed Care) | Admitting: Pharmacist

## 2021-10-07 DIAGNOSIS — E1129 Type 2 diabetes mellitus with other diabetic kidney complication: Secondary | ICD-10-CM

## 2021-10-07 DIAGNOSIS — I10 Essential (primary) hypertension: Secondary | ICD-10-CM

## 2021-10-07 NOTE — Progress Notes (Signed)
? ?Chronic Care Management ?Pharmacy Note ? ?10/09/2021 ?Name:  Doris Jackson MRN:  [id number] DOB:  1941/04/09 ? ?Summary: ?A1c not at goal < 7% ?LDL not at goal < 70 ?  ?Recommendations/Changes made from today's visit: ?-Recommend trial of saline nasal spray for allergies ?-Recommended starting Voltaren gel or Salon pas ?-Recommended calcium citrate 600 mg 1 tablet daily in addition to the vitamin D  ?-Recommend repeat vitamin D level ?-Recommend increasing pravastatin based on elevated LDL ?  ? ?Plan: ?Follow up after PCP visit ? ?Subjective: ?Doris Jackson is an 81 y.o. year old female who is a primary patient of Koberlein, Steele Berg, MD.  The CCM team was consulted for assistance with disease management and care coordination needs.   ? ?Engaged with patient by telephone for follow up visit in response to provider referral for pharmacy case management and/or care coordination services.  ? ?Consent to Services:  ?The patient was given information about Chronic Care Management services, agreed to services, and gave verbal consent prior to initiation of services.  Please see initial visit note for detailed documentation.  ? ?Patient Care Team: ?Caren Macadam, MD as PCP - General (Family Medicine) ?Viona Gilmore, [facility] as Pharmacist (Pharmacist) ?Thana Ates, RN as [place] Management ?Randon Goldsmith, OT as Occupational Therapist (Occupational Therapy) ? ?Recent office visits: ?07/19/21 [name], MD: Patient presented for video visit for back pain. Referred to home health. ? ?05/13/21 [name], MD: Patient presented for chronic conditions follow up. Prevnar 20 administered. Follow up in 3 months for CPE. ? ?Recent consult visits: ?10/02/21 [name], MD (allergy): Patient presented for allergy shots. ? ?05/28/21 [name], MD  (Ophthalmology) - Patient presented for Fuchs corneal dystrophy of both eyes and other concerns. No medication changes  notes. ? ?05/06/21 [name], PT (outpatient rehab): Patient presented for PT session. ? ?Hospital visits: ?None in previous 6 months. ? ?Objective: ? ?Lab Results  ?Component Value Date  ? CREATININE 1.39 (H) 05/13/2021  ? BUN 33 (H) 05/13/2021  ? GFR 36.14 (L) 05/13/2021  ? GFRNONAA 45 (L) 09/24/2020  ? GFRAA >60 10/21/2019  ? NA 141 05/13/2021  ? K 3.9 05/13/2021  ? CALCIUM 9.2 05/13/2021  ? CO2 29 05/13/2021  ? GLUCOSE 146 (H) 05/13/2021  ? ? ?Lab Results  ?Component Value Date/Time  ? HGBA1C 7.5 (H) 05/13/2021 01:53 PM  ? HGBA1C 6.6 (H) 09/19/2020 08:08 AM  ? GFR 36.14 (L) 05/13/2021 01:53 PM  ? GFR 64.16 12/19/2019 01:04 PM  ? MICROALBUR <0.7 05/13/2021 01:53 PM  ? MICROALBUR 0.7 03/21/2020 11:45 AM  ?  ?Last diabetic Eye exam:  ?Lab Results  ?Component Value Date/Time  ? HMDIABEYEEXA No Retinopathy 06/05/2020 12:00 AM  ?  ?Last diabetic Foot exam: No results found for: HMDIABFOOTEX  ? ?Lab Results  ?Component Value Date  ? CHOL 162 05/13/2021  ? HDL 48.00 05/13/2021  ? O'Neill 88 05/13/2021  ? TRIG 130.0 05/13/2021  ? CHOLHDL 3 05/13/2021  ? ? ? ?  Latest Ref Rng & Units 05/13/2021  ?  1:53 PM 03/21/2020  ? 11:45 AM 11/16/2019  ?  2:55 PM  ?Hepatic Function  ?Total Protein 6.0 - 8.3 g/dL 7.5   7.1   7.3    ?Albumin 3.5 - 5.2 g/dL 3.9    3.9    ?AST 0 - 37 U/L '13   10   11    ' ?ALT 0 - 35 U/L 11  6   6    ?Alk Phosphatase 39 - 117 U/L 97    110    ?Total Bilirubin 0.2 - 1.2 mg/dL 0.4   0.4   0.5    ? ? ?Lab Results  ?Component Value Date/Time  ? TSH 1.20 09/07/2019 10:52 AM  ? TSH 1.20 07/02/2017 01:30 PM  ? ? ? ?  Latest Ref Rng & Units 06/25/2021  ? 10:42 AM 05/13/2021  ?  1:53 PM 09/25/2020  ? 12:17 PM  ?CBC  ?WBC 4.0 - 10.5 K/uL 6.2   5.9   8.4    ?Hemoglobin 12.0 - 15.0 g/dL 10.2   9.7   8.1    ?Hematocrit 36.0 - 46.0 % 31.4   29.3   24.3    ?Platelets 150.0 - 400.0 K/uL 137.0   127.0 Repeated and verified X2.   242    ? ? ?Lab Results  ?Component Value Date/Time  ? VD25OH 61 03/21/2020 11:45 AM  ? VD25OH  37 04/20/2012 11:11 AM  ? ? ?Clinical ASCVD: No  ?The 10-year ASCVD risk score (Arnett DK, et al., 2019) is: 26.3% ?  Values used to calculate the score: ?    Age: 81 years ?    Sex: Female ?    Is Non-Hispanic African American: Yes ?    Diabetic: Yes ?    Tobacco smoker: No ?    Systolic Blood Pressure: 832 mmHg ?    Is BP treated: Yes ?    HDL Cholesterol: 48 mg/dL ?    Total Cholesterol: 162 mg/dL   ? ? ?  07/15/2021  ? 11:33 AM 06/12/2021  ?  1:20 PM 11/13/2020  ?  3:25 PM  ?Depression screen PHQ 2/9  ?Decreased Interest 0 0 0  ?Down, Depressed, Hopeless 0 0 0  ?PHQ - 2 Score 0 0 0  ?  ? ? ?Social History  ? ?Tobacco Use  ?Smoking Status Never  ?Smokeless Tobacco Never  ? ?BP Readings from Last 3 Encounters:  ?05/13/21 110/60  ?12/12/20 132/60  ?09/27/20 132/64  ? ?Pulse Readings from Last 3 Encounters:  ?05/13/21 83  ?12/12/20 83  ?09/27/20 90  ? ?Wt Readings from Last 3 Encounters:  ?05/13/21 236 lb 1.6 oz (107.1 kg)  ?12/12/20 223 lb 4.8 oz (101.3 kg)  ?09/19/20 226 lb 2.7 oz (102.6 kg)  ? ?BMI Readings from Last 3 Encounters:  ?05/13/21 43.18 kg/m?  ?12/12/20 40.84 kg/m?  ?09/19/20 41.37 kg/m?  ? ? ?Assessment/Interventions: Review of patient past medical history, allergies, medications, health status, including review of consultants reports, laboratory and other test data, was performed as part of comprehensive evaluation and provision of chronic care management services.  ? ?SDOH:  (Social Determinants of Health) assessments and interventions performed: No ? ?SDOH Screenings  ? ?Alcohol Screen: Not on file  ?Depression (PHQ2-9): Low Risk   ? PHQ-2 Score: 0  ?Financial Resource Strain: Low Risk   ? Difficulty of Paying Living Expenses: Not very hard  ?Food Insecurity: No Food Insecurity  ? Worried About Charity fundraiser in the Last Year: Never true  ? Ran Out of Food in the Last Year: Never true  ?Housing: Low Risk   ? Last Housing Risk Score: 0  ?Physical Activity: Insufficiently Active  ? Days of  Exercise per Week: 3 days  ? Minutes of Exercise per Session: 30 min  ?Social Connections: Moderately Isolated  ? Frequency of Communication with Friends and Family: Twice a week  ?  Frequency of Social Gatherings with Friends and Family: Twice a week  ? Attends Religious Services: Never  ? Active Member of Clubs or Organizations: No  ? Attends Archivist Meetings: Never  ? Marital Status: Married  ?Stress: No Stress Concern Present  ? Feeling of Stress : Not at all  ?Tobacco Use: Low Risk   ? Smoking Tobacco Use: Never  ? Smokeless Tobacco Use: Never  ? Passive Exposure: Not on file  ?Transportation Needs: Not on file  ? ? ? ?CCM Care Plan ? ?Allergies  ?Allergen Reactions  ? Dust Mite Extract Itching and Cough  ? Latex Itching  ?  Powder gloves  ? ? ?Medications Reviewed Today   ? ? Reviewed by Viona Gilmore, [place] (Pharmacist) on 10/07/21 at 1508  Med List Status: <None>  ? ?Medication Order Taking? Sig Documenting Provider Last Dose Status Informant  ?Ascorbic Acid (VITAMIN C) 1000 MG tablet [id number]  Take 1,000 mg by mouth daily. [provider]  Active   ?benazepril (LOTENSIN) 40 MG tablet [id number]  Take 1 tablet by mouth once daily Koberlein, Junell C, MD  Active   ?benzonatate (TESSALON) 200 MG capsule [id number] Yes Take 200 mg by mouth 3 (three) times daily as needed for cough. [provider] Taking Active   ?Blood Glucose Monitoring Suppl (ONETOUCH VERIO REFLECT) w/Device KIT [id number]  USE UP TO FOUR TIMES DAILY AS DIRECTED. Caren Macadam, MD  Active   ?Cholecalciferol (VITAMIN D) 2000 UNITS CAPS [id number]  Take 4,000 Units by mouth daily.  [provider]  Active Self  ?cyanocobalamin 2000 MCG tablet [id number]  Take 2,000 mcg by mouth daily. [provider]  Active Self  ?famotidine (PEPCID) 40 MG tablet [id number]  TAKE 1 TABLET BY MOUTH AT BEDTIME Koberlein, Steele Berg, MD  Active   ?FERREX 150 150 MG capsule [id number]  TAKE 1 CAPSULE BY MOUTH ONCE  DAILY ** PATIENT NEEDS AN APPOINTMENT **  ?Patient taking differently: 2 capsules 2 days of the week and one capsule all other days  ? Caren Macadam, MD  Active   ?fexofenadine (ALLEGRA) 180 MG tablet [id number]

## 2021-10-09 ENCOUNTER — Other Ambulatory Visit: Payer: Self-pay | Admitting: Family Medicine

## 2021-10-11 DIAGNOSIS — I890 Lymphedema, not elsewhere classified: Secondary | ICD-10-CM | POA: Diagnosis not present

## 2021-10-13 NOTE — Telephone Encounter (Signed)
See notes

## 2021-10-15 ENCOUNTER — Encounter: Payer: Self-pay | Admitting: Family Medicine

## 2021-10-22 DIAGNOSIS — J3089 Other allergic rhinitis: Secondary | ICD-10-CM | POA: Diagnosis not present

## 2021-10-22 DIAGNOSIS — J3081 Allergic rhinitis due to animal (cat) (dog) hair and dander: Secondary | ICD-10-CM | POA: Diagnosis not present

## 2021-10-22 DIAGNOSIS — J301 Allergic rhinitis due to pollen: Secondary | ICD-10-CM | POA: Diagnosis not present

## 2021-10-27 DIAGNOSIS — E1129 Type 2 diabetes mellitus with other diabetic kidney complication: Secondary | ICD-10-CM

## 2021-10-27 DIAGNOSIS — I10 Essential (primary) hypertension: Secondary | ICD-10-CM | POA: Diagnosis not present

## 2021-10-27 DIAGNOSIS — E785 Hyperlipidemia, unspecified: Secondary | ICD-10-CM | POA: Diagnosis not present

## 2021-10-28 ENCOUNTER — Encounter: Payer: Self-pay | Admitting: Family Medicine

## 2021-10-28 ENCOUNTER — Ambulatory Visit (INDEPENDENT_AMBULATORY_CARE_PROVIDER_SITE_OTHER): Payer: Medicare (Managed Care) | Admitting: Family Medicine

## 2021-10-28 ENCOUNTER — Ambulatory Visit: Payer: Medicare HMO | Admitting: Family Medicine

## 2021-10-28 VITALS — BP 132/80 | HR 76 | Temp 97.7°F | Ht 62.0 in | Wt 233.8 lb

## 2021-10-28 DIAGNOSIS — E559 Vitamin D deficiency, unspecified: Secondary | ICD-10-CM | POA: Diagnosis not present

## 2021-10-28 DIAGNOSIS — E1129 Type 2 diabetes mellitus with other diabetic kidney complication: Secondary | ICD-10-CM

## 2021-10-28 DIAGNOSIS — E611 Iron deficiency: Secondary | ICD-10-CM

## 2021-10-28 DIAGNOSIS — K219 Gastro-esophageal reflux disease without esophagitis: Secondary | ICD-10-CM

## 2021-10-28 DIAGNOSIS — E785 Hyperlipidemia, unspecified: Secondary | ICD-10-CM | POA: Diagnosis not present

## 2021-10-28 DIAGNOSIS — G8929 Other chronic pain: Secondary | ICD-10-CM

## 2021-10-28 DIAGNOSIS — M5441 Lumbago with sciatica, right side: Secondary | ICD-10-CM

## 2021-10-28 DIAGNOSIS — I10 Essential (primary) hypertension: Secondary | ICD-10-CM

## 2021-10-28 DIAGNOSIS — M159 Polyosteoarthritis, unspecified: Secondary | ICD-10-CM

## 2021-10-28 DIAGNOSIS — M150 Primary generalized (osteo)arthritis: Secondary | ICD-10-CM

## 2021-10-28 LAB — HEMOGLOBIN A1C: Hgb A1c MFr Bld: 6.7 % — ABNORMAL HIGH (ref 4.6–6.5)

## 2021-10-28 LAB — CBC WITH DIFFERENTIAL/PLATELET
Basophils Absolute: 0 10*3/uL (ref 0.0–0.1)
Basophils Relative: 0.5 % (ref 0.0–3.0)
Eosinophils Absolute: 0.1 10*3/uL (ref 0.0–0.7)
Eosinophils Relative: 2 % (ref 0.0–5.0)
HCT: 31.1 % — ABNORMAL LOW (ref 36.0–46.0)
Hemoglobin: 10.4 g/dL — ABNORMAL LOW (ref 12.0–15.0)
Lymphocytes Relative: 33.8 % (ref 12.0–46.0)
Lymphs Abs: 2.1 10*3/uL (ref 0.7–4.0)
MCHC: 33.5 g/dL (ref 30.0–36.0)
MCV: 78.4 fl (ref 78.0–100.0)
Monocytes Absolute: 0.7 10*3/uL (ref 0.1–1.0)
Monocytes Relative: 11.3 % (ref 3.0–12.0)
Neutro Abs: 3.3 10*3/uL (ref 1.4–7.7)
Neutrophils Relative %: 52.4 % (ref 43.0–77.0)
Platelets: 135 10*3/uL — ABNORMAL LOW (ref 150.0–400.0)
RBC: 3.97 Mil/uL (ref 3.87–5.11)
RDW: 17.8 % — ABNORMAL HIGH (ref 11.5–15.5)
WBC: 6.4 10*3/uL (ref 4.0–10.5)

## 2021-10-28 LAB — LIPID PANEL
Cholesterol: 171 mg/dL (ref 0–200)
HDL: 44.2 mg/dL (ref >39.00)
LDL Cholesterol: 90 mg/dL (ref 0–99)
NonHDL: 126.5
Total CHOL/HDL Ratio: 4
Triglycerides: 183 mg/dL — ABNORMAL HIGH (ref 0.0–149.0)
VLDL: 36.6 mg/dL (ref 0.0–40.0)

## 2021-10-28 LAB — COMPREHENSIVE METABOLIC PANEL
ALT: 9 U/L (ref 0–35)
AST: 12 U/L (ref 0–37)
Albumin: 3.9 g/dL (ref 3.5–5.2)
Alkaline Phosphatase: 105 U/L (ref 39–117)
BUN: 41 mg/dL — ABNORMAL HIGH (ref 6–23)
CO2: 28 mEq/L (ref 19–32)
Calcium: 9.2 mg/dL (ref 8.4–10.5)
Chloride: 102 mEq/L (ref 96–112)
Creatinine, Ser: 1.55 mg/dL — ABNORMAL HIGH (ref 0.40–1.20)
GFR: 31.61 mL/min — ABNORMAL LOW (ref >60.00)
Glucose, Bld: 138 mg/dL — ABNORMAL HIGH (ref 70–99)
Potassium: 4 mEq/L (ref 3.5–5.1)
Sodium: 137 mEq/L (ref 135–145)
Total Bilirubin: 0.5 mg/dL (ref 0.2–1.2)
Total Protein: 7.9 g/dL (ref 6.0–8.3)

## 2021-10-28 LAB — FERRITIN: Ferritin: 29.3 ng/mL (ref 10.0–291.0)

## 2021-10-28 LAB — VITAMIN D 25 HYDROXY (VIT D DEFICIENCY, FRACTURES): VITD: 77.39 ng/mL (ref 30.00–100.00)

## 2021-10-28 MED ORDER — HYDROCODONE-ACETAMINOPHEN 5-325 MG PO TABS: 1.0000 | ORAL_TABLET | Freq: Every day | ORAL | 0 refills | Status: DC | PRN

## 2021-10-28 NOTE — Progress Notes (Signed)
?Doris Jackson ?DOB: [birth year] ?Encounter date: 10/28/2021 ? ?This is a 81 y.o. female who presents with ?Chief Complaint  ?Patient presents with  ? Follow-up  ? ? ?History of present illness: ? ?Lymphedema: supposed to use pump regularly, but she hasn't been doing this. Better in terms of mobility, but still has a little swelling. No pain in arm; just a little heavy with fluid. Has been released from ortho.  ? ?Low back pain still present - takes the tylenol for pain typically. Did better with back pain when she was able to take the hydrocodone if needed.she is limited with ability to stand from her back. Still urinating frequently so up and down a lot. Does have a home nurse coming out that makes sure she is working on exercises and they have also done some house repairs to help with navigating house- added railings and ramp. Got her lighter weight vacuum cleaner as well and reacher to help with things around house. They check on her regularly.  ? ?Interested in signing up for [place] because husband doesn't want her driving, but she would like a little more independence. She was driving until she had fall in the convenience store and broke shoulder.  ? ? ?Sugar has been doing better. She has times when sugar looking higher - ie eating fried food, or getting off track with eating. Sometimes up to 150 in the morning, but usually if eating less healthy. Sometimes will see sugars up to 200 after meal. Takes full metformin if her sugar is higher, otherwise will do half tablet. Others she takes daily and doesn't break. Sugars in afternoon are usually in 120's after her morning  meds.  ? ? ?Allergies  ?Allergen Reactions  ? Dust Mite Extract Itching and Cough  ? Latex Itching  ?  Powder gloves  ? ?Current Meds  ?Medication Sig  ? acetaminophen (TYLENOL) 500 MG tablet Take 500 mg by mouth every 6 (six) hours as needed.  ? Ascorbic Acid (VITAMIN C) 1000 MG tablet Take 1,000 mg by mouth daily.  ? benazepril (LOTENSIN) 40 MG  tablet Take 1 tablet by mouth once daily  ? benzonatate (TESSALON) 200 MG capsule Take 200 mg by mouth 3 (three) times daily as needed for cough.  ? Blood Glucose Monitoring Suppl (ONETOUCH VERIO REFLECT) w/Device KIT USE UP TO FOUR TIMES DAILY AS DIRECTED.  ? Cholecalciferol (VITAMIN D) 2000 UNITS CAPS Take 4,000 Units by mouth daily.   ? cyanocobalamin 2000 MCG tablet Take 2,000 mcg by mouth daily.  ? diclofenac Sodium (VOLTAREN) 1 % GEL Apply 2 g topically 4 (four) times daily.  ? famotidine (PEPCID) 40 MG tablet TAKE 1 TABLET BY MOUTH AT BEDTIME  ? FERREX 150 150 MG capsule TAKE 1 CAPSULE BY MOUTH ONCE DAILY ** PATIENT NEEDS AN APPOINTMENT ** (Patient taking differently: 2 capsules 2 days of the week and one capsule all other days)  ? fexofenadine (ALLEGRA) 180 MG tablet Take 180 mg by mouth daily as needed for allergies.  ? fluticasone (FLONASE) 50 MCG/ACT nasal spray Place 1 spray into both nostrils daily.  ? furosemide (LASIX) 20 MG tablet Take 1 tablet (20 mg total) by mouth daily as needed.  ? glipiZIDE (GLUCOTROL XL) 5 MG 24 hr tablet Take 1 tablet by mouth once daily  ? hydrochlorothiazide (HYDRODIURIL) 12.5 MG tablet Take 1 tablet by mouth once daily  ? Lancets 30G MISC USE TWICE DAILY AS DIRECTED FOR GLUCOSE TESTING AND MONITORING  ? Lancets MISC  Use as directed  ? metFORMIN (GLUCOPHAGE-XR) 500 MG 24 hr tablet Take 1 tablet by mouth twice daily  ? metoprolol succinate (TOPROL-XL) 50 MG 24 hr tablet TAKE 1 TABLET BY MOUTH ONCE DAILY ---TAKE  WITH  OR  IMMEDIATELY  FOLLOWING  A  MEAL  --PT  NEEDS  AN  APPOINTMENT  ? ONE TOUCH LANCETS MISC Test twice daily.  ? ONETOUCH VERIO test strip USE STRIP TO CHECK GLUCOSE 2-4 TIMES DAILY AS NEEDED FOR GLUCOSE CONTROL  ? pioglitazone (ACTOS) 45 MG tablet TAKE 1 TABLET BY MOUTH ONCE DAILY **PATIENT  NEEDS  AN  APPOINTMENT  WITH  PCP**  ? pravastatin (PRAVACHOL) 40 MG tablet Take 1 tablet by mouth once daily  ? prednisoLONE acetate (PRED FORTE) 1 % ophthalmic  suspension Place 1 drop into both eyes See admin instructions. Place 1 drop into left eye three times daily Place 1 drop into right eye once daily  ? Probiotic Product (PROBIOTIC-10 PO) Take 1 capsule by mouth daily.  ? senna-docusate (SENOKOT-S) 8.6-50 MG tablet Take 1 tablet by mouth at bedtime.  ? [DISCONTINUED] metFORMIN (GLUCOPHAGE) 500 MG tablet Take 500 mg by mouth 2 (two) times daily with a meal.  ? ? ?Review of Systems  ?Constitutional:  Negative for chills, fatigue and fever.  ?Respiratory:  Negative for cough, chest tightness, shortness of breath and wheezing.   ?Cardiovascular:  Negative for chest pain, palpitations and leg swelling.  ?Neurological:  Weakness: she does feel like she is getting stronger, but still limited with prolonged activities (ie standing to cook, walking, etc).  ? ?Objective: ? ?BP 132/80 (BP Location: Left Arm, Patient Position: Sitting, Cuff Size: Large)   Pulse 76   Temp 97.7 ?F (36.5 ?C) (Oral)   Ht '5\' 2"'  (1.575 m)   Wt 233 lb 12.8 oz (106.1 kg)   SpO2 99%   BMI 42.76 kg/m?   Weight: 233 lb 12.8 oz (106.1 kg)  ? ?BP Readings from Last 3 Encounters:  ?10/28/21 132/80  ?05/13/21 110/60  ?12/12/20 132/60  ? ?Wt Readings from Last 3 Encounters:  ?10/28/21 233 lb 12.8 oz (106.1 kg)  ?05/13/21 236 lb 1.6 oz (107.1 kg)  ?12/12/20 223 lb 4.8 oz (101.3 kg)  ? ? ?Physical Exam ?Constitutional:   ?   General: She is not in acute distress. ?   Appearance: She is well-developed.  ?HENT:  ?   Head: Normocephalic and atraumatic.  ?Cardiovascular:  ?   Rate and Rhythm: Normal rate and regular rhythm.  ?   Heart sounds: Normal heart sounds. No murmur heard. ?Pulmonary:  ?   Effort: Pulmonary effort is normal.  ?   Breath sounds: Normal breath sounds.  ?Abdominal:  ?   General: Bowel sounds are normal. There is no distension.  ?   Palpations: Abdomen is soft.  ?   Tenderness: There is no abdominal tenderness. There is no guarding.  ?Feet:  ?   Comments: Normal bilateral monofilament foot  exam.  Mild heel callus.  Skin is intact. ?Skin: ?   General: Skin is warm and dry.  ?   Comments: Sensory exam of the foot is normal, tested with the monofilament. Good pulses, no lesions or ulcers, good peripheral pulses.  ?Psychiatric:     ?   Judgment: Judgment normal.  ? ? ?Assessment/Plan ? ?1. Essential hypertension ?Continue with benazepril 40 mg, hydrochlorothiazide 12.5 mg, Toprol 50 mg.  Furosemide if needed for swelling. ?- CBC with Differential/Platelet; Future ?- Comprehensive metabolic  panel; Future ?- Comprehensive metabolic panel ?- CBC with Differential/Platelet ? ?2. 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  Blood sugar is well controlled however.  Recheck blood work today. ?- Hemoglobin A1c; Future ?- HM DIABETES FOOT EXAM ?- Hemoglobin A1c ? ?3. Primary osteoarthritis involving multiple joints ?She previously had pain medication which she used intermittently when pain was severe.  Having something on hand is helpful for her to enable continued mobility and independence.  We discussed choices and she did well in the past with hydrocodone 5 mg dose.  We discussed using sparingly and trying to limit to once a day or less. ?- HYDROcodone-acetaminophen (NORCO/VICODIN) 5-325 MG tablet; Take 1 tablet by mouth daily as needed.  Dispense: 30 tablet; Refill: 0 ? ?4. Dyslipidemia ?Pravastatin 40 mg daily.  Recheck blood work today. ?- Lipid panel; Future ?- Lipid panel ? ?5. Gastroesophageal reflux disease, unspecified whether esophagitis present ?Pepcid 40 mg daily. ? ?6. Chronic right-sided low back pain with right-sided sciatica ?- HYDROcodone-acetaminophen (NORCO/VICODIN) 5-325 MG tablet; Take 1 tablet by mouth daily as needed.  Dispense: 30 tablet; Refill: 0 ? ?7. Vitamin D deficiency ?- VITAMIN D 25 Hydroxy (Vit-D Deficiency,  Fractures); Future ?- VITAMIN D 25 Hydroxy (Vit-D Deficiency, Fractures) ? ?8. Iron deficiency ?- Ferritin; Future ?- Ferritin ? ?Return for pending lab or imaging results. ? ? ? ? ?[name], MD ?

## 2021-10-28 NOTE — Patient Instructions (Signed)
*  check your meds at home - let me know if you are NOT doing the metformin xr.  ?

## 2021-10-29 ENCOUNTER — Telehealth: Payer: Self-pay | Admitting: Family Medicine

## 2021-10-29 NOTE — Telephone Encounter (Signed)
Pharmacy needs to know if this prescription is for chronic pain or acute. Chronic needs a PA and acute they can only disperse for 5 days. Please advise  ?

## 2021-10-29 NOTE — Telephone Encounter (Signed)
Medication is HYDROcodone-acetaminophen (NORCO/VICODIN) 5-325 MG tablet ?

## 2021-10-29 NOTE — Telephone Encounter (Signed)
Pt is calling and would like at least #30 hydrocodone ?

## 2021-10-30 MED ORDER — ROSUVASTATIN CALCIUM 5 MG PO TABS: 5.0000 mg | ORAL_TABLET | Freq: Every day | ORAL | 1 refills | Status: DC

## 2021-10-30 NOTE — Telephone Encounter (Signed)
Rx is for chronic pain. If insurance is not covering, she can cash pay rather than go through [place].  ?

## 2021-10-30 NOTE — Addendum Note (Signed)
Addended by: Agnes Lawrence on: 10/30/2021 08:37 AM ? ? Modules accepted: Orders ? ?

## 2021-10-30 NOTE — Telephone Encounter (Signed)
Patient informed of the message below.

## 2021-11-01 ENCOUNTER — Ambulatory Visit: Payer: Medicare HMO | Admitting: Family Medicine

## 2021-11-02 ENCOUNTER — Other Ambulatory Visit: Payer: Self-pay | Admitting: Family Medicine

## 2021-11-04 ENCOUNTER — Telehealth: Payer: Self-pay | Admitting: Family Medicine

## 2021-11-04 ENCOUNTER — Telehealth: Payer: Self-pay

## 2021-11-04 NOTE — Patient Outreach (Signed)
[facility]ging [facility] Program ? ?11/04/2021 ? ?Doris Jackson ?02/04/1941 ?[id number] ? ? ?Call completed to confirm A[facility]fully appointment scheduled for 11/05/21 10:30 am. ? ?Thea Silversmith, RN, MSN, BSN, CCM ?Care Management Coordinator ?[facility]ging [facility] ?[phone]  ?

## 2021-11-04 NOTE — Telephone Encounter (Signed)
SCAT form to be filled out--placed in dr's folder.  Call [phone] upon completion. ?

## 2021-11-05 ENCOUNTER — Other Ambulatory Visit: Payer: Self-pay

## 2021-11-05 ENCOUNTER — Other Ambulatory Visit: Payer: Self-pay | Admitting: Occupational Therapy

## 2021-11-05 ENCOUNTER — Other Ambulatory Visit: Payer: Self-pay | Admitting: Family Medicine

## 2021-11-05 NOTE — Patient Outreach (Signed)
[facility]ging [facility]fully Program ? ?RN Visit ? ?11/05/2021 ? ?Doris Jackson ?1940-11-29 ?[id number] ? ?Visit:   Aging [facility]fully RN Visit #4 ? ?Start Time:  10:30 am  ?End Time:   11:04 am ?Total Minutes:   34 minutes ? ?Readiness To Change Score:  9.33  ? ?Universal RN Interventions: ?Calendar Distribution: Yes (verified that she has been provided a calender) ?Exercise Review: Yes (reports she has been provided exercise booklet and denies any questions) ?Medications: Yes (reports she has medications and denies any needs) ?Medication Changes: Yes (reports rosuvastatin added at latest provider visit) ?Pain: Yes (zero pain today. reports prn hydrocodone 1/2 tablet - last taken this past Saturday.) ?Fall Prevention: Yes (denies any falls) ?Clinician View Of Client Situation: pleasant greeting at door. Patient ambulated to door without assistive device. home need. Compression stockings on legs. ?Client View Of His/Her Situation: Reports, "I am pretty satisfied". she reports, saw PCP last Monday and A1C down to 6.7 from 7.5. She states she continues to use the compression device 2-3 times/week. She reports provider renewed prescription  for Hydrocodone for back pain. She did not want to get the bottle for RNCM to review, but states she only took 1/2 tablet on Saturday and she has zero pain today. She reports she has signed up to ride [employer]. ? ?Healthcare Provider Communication: ?Did Higher education careers adviser With [employer] Provider?: No ?According to Client, Did PCP Report Communication With An Aging Gracefully RN?: No ? ?Clinician View of Client Situation: ?Clinician View Of Client Situation: pleasant greeting at door. Patient ambulated to door without assistive device. home need. Compression stockings on legs. ?Client's View of His/Her Situation: ?Ambulance person Of His/Her Situation: Reports, "I am pretty satisfied". she reports, saw PCP last Monday and A1C down to 6.7 from 7.5. She states she continues to use the compression device 2-3  times/week. She reports provider renewed prescription  for Hydrocodone for back pain. She did not want to get the bottle for RNCM to review, but states she only took 1/2 tablet on Saturday and she has zero pain today. She reports she has signed up to ride SCAT. ? ?Medication Assessment: reports rosuvastatin added and prescription for hydrocodone provided for back pain. She reports 1/2 tablet relieved her pain. ?  ? ?OT Update: confirmed occupational therapist to bring patient reacher at next visit ? ?Session Summary: Doris Jackson reports she is pretty satisfied. RNCM reviewed goals/interventions previously discussed. Provided encouragement and positive feedback to patient. She reports she is trying to do what she needs to do in order to improve her health.  ? ?Thea Silversmith, RN, MSN, BSN, CCM ?[facility] ?Care Management Coordinator ?[phone]  ? ? ?

## 2021-11-05 NOTE — Patient Instructions (Addendum)
Goals Addressed   ? ?  ?  ?  ?  ?  ? This Visit's Progress  ?   Patient Stated (pt-stated)     ?   Aging [facility]fully RN ? ?Goal: Patient will report being able to get things accomplished at home in a timely manner, compression machine, CBG monitoring in the next 4 months. ? ?11/05/21 ?Assessment: patient reports she is doing well. She reports continues to work on getting things done in a timely manner, but notes improvement. She is wearing compression stockings today. Went to provider appointment this last week-pain medication prescription refilled for back pain. She reports took 1/2 tablet over the weekend, which helped. Zero pain today. CBG 138 this morning. She reports A1C checked last week: A1C decreased to 6.7 from 7.5. ? ?Interventions: Reinforced the importance of using the compression machine, provided positive feedback regarding management of health efforts, encouraged to complete a list of things to do and prioritize list. ? ?Plan: 4th/Final visit. Patient encouraged to follow up with Primary care doctor for any further questions or concerns. ? ?Thea Silversmith, RN, MSN, BSN, CCM ?[facility]ging [facility] ?Care Management Coordinator ?[phone]  ?  ?   Patient Stated (pt-stated)     ?   Aging [facility]fully RN ? ?Goal:  Patient will report no falls in the next 4 months ? ?11/05/21 ?Assessment: patient denies any falls since last home visit. ?Interventions: RNCM discussed the importance of maintaining muscle tone and strength. Encouraged patient to continue her daily exercise program. ?Plan: Final Visit. RNCM encouraged patient to contact primary care provider for any additional questions or concerns. ? ?Thea Silversmith, RN, MSN, BSN, CCM ?[facility]ging [facility] ?Care Management Coordinator ?[phone]  ?  ? ?

## 2021-11-06 NOTE — Telephone Encounter (Signed)
Paperwork completed. I flagged one line that she needs to sign; otherwise it should be good to go. ?

## 2021-11-06 NOTE — Telephone Encounter (Signed)
Spoke with the patient and informed her the forms were completed and she stated her husband will pick up the forms for her.  Patient was reminded to sign "part B" of the form and a copy was sent to be scanned. ?

## 2021-11-08 NOTE — Patient Instructions (Signed)
She would like to feel more safe in her bathroom (2 grab bars in bath tub) and securing her toilet to floor would help this. ?11/05/2021:Reports that these modifications have really helped her. ?

## 2021-11-08 NOTE — Patient Outreach (Signed)
Aging Gracefully Program ? ?OT Follow-Up Visit ? ?11/08/2021 ? ?Doris Jackson ?December 29, 1940 ?[id number] ? ?Visit:  3- Third Visit ? ?Start Time:  1600 ?End Time:  8088 ?Total Minutes:  45 ? ?Readiness to Change Score :  Readiness to Change Score: 10 ? ?Durable Medical Equipment: ?Adaptive Equipment: Reacher ?Adaptive Equipment Distribution Date: 11/05/21 ? ?Patient Education: ?Education Provided: Yes ?Education Details: Getting up from a fall, Home safety checklist ?Person(s) Educated: Patient ?Comprehension: Verbalized Understanding ? ?Goals:  ? Goals Addressed   ? ?  ?  ?  ?  ? This Visit's Progress  ?  COMPLETED: Patient Stated     ?  She would like to feel more safe in her bathroom (2 grab bars in bath tub) and securing her toilet to floor would help this. ?11/05/2021:Reports that these modifications have really helped her. ?  ? ?  ? ? ?Post Clinical Reasoning: ?Clinician View Of Client Situation:: Doris Jackson was very receptive to the education presented to her today. We talked and I demonstrated ways for her to get from a fall (as well as handout was given). She has bad knees and it is very hard for her to be on them on a hard surface. So we talked about and I demonstrated how she could slide backwards on her bottom or walk backwards on her bottom to get to a surface before she turns to get up on all 4s to then get up. We also talked about and I showed her how to get seated Tai Chi on her phone off of [employer] for her to give a try. ?Client View Of His/Her Situation:: She contitnues to feel that she is well for herself and states the home modifications done for her have been a big help. ?Next Visit Plan:: Booklet ? ?[place], OTR/L ?Aging [facility]fully ?[phone] ? ? ?

## 2021-11-10 DIAGNOSIS — I890 Lymphedema, not elsewhere classified: Secondary | ICD-10-CM | POA: Diagnosis not present

## 2021-11-11 DIAGNOSIS — J3089 Other allergic rhinitis: Secondary | ICD-10-CM | POA: Diagnosis not present

## 2021-11-11 DIAGNOSIS — J301 Allergic rhinitis due to pollen: Secondary | ICD-10-CM | POA: Diagnosis not present

## 2021-11-14 DIAGNOSIS — H26492 Other secondary cataract, left eye: Secondary | ICD-10-CM | POA: Diagnosis not present

## 2021-11-15 ENCOUNTER — Other Ambulatory Visit: Payer: Self-pay | Admitting: Family Medicine

## 2021-11-19 ENCOUNTER — Ambulatory Visit: Payer: Medicare (Managed Care)

## 2021-11-19 ENCOUNTER — Telehealth: Payer: Self-pay

## 2021-11-19 NOTE — Telephone Encounter (Signed)
This nurse called patient for telephonic AWV. Patient states that she forgot and is out and about. Rescheduled to next week.

## 2021-11-26 ENCOUNTER — Ambulatory Visit (INDEPENDENT_AMBULATORY_CARE_PROVIDER_SITE_OTHER): Payer: Medicare (Managed Care)

## 2021-11-26 VITALS — Ht 62.0 in | Wt 236.0 lb

## 2021-11-26 DIAGNOSIS — Z0000 Encounter for general adult medical examination without abnormal findings: Secondary | ICD-10-CM

## 2021-11-26 NOTE — Progress Notes (Signed)
I connected with Nancie Neas today by telephone and verified that I am speaking with the correct person using two identifiers. Location patient: home Location provider: work Persons participating in the virtual visit: Estefani Bateson, Glenna Durand LPN.   I discussed the limitations, risks, security and privacy concerns of performing an evaluation and management service by telephone and the availability of in person appointments. I also discussed with the patient that there may be a patient responsible charge related to this service. The patient expressed understanding and verbally consented to this telephonic visit.    Interactive audio and video telecommunications were attempted between this provider and patient, however failed, due to patient having technical difficulties OR patient did not have access to video capability.  We continued and completed visit with audio only.     Vital signs may be patient reported or missing.  Subjective:   Doris Jackson is a 81 y.o. female who presents for Medicare Annual (Subsequent) preventive examination.  Review of Systems     Cardiac Risk Factors include: advanced age (>30mn, >>8women);diabetes mellitus;dyslipidemia;hypertension;obesity (BMI >30kg/m2)     Objective:    Today's Vitals   11/26/21 1456  Weight: 236 lb (107 kg)  Height: '5\' 2"'  (1.575 m)   Body mass index is 43.16 kg/m.     11/26/2021    3:11 PM 11/26/2021    3:09 PM 02/04/2021   11:11 AM 11/13/2020    3:26 PM 09/18/2020   12:15 PM 09/13/2020    4:06 PM 12/30/2019   10:18 AM  Advanced Directives  Does Patient Have a Medical Advance Directive? No No No No No No No  Would patient like information on creating a medical advance directive?   No - Patient declined No - Patient declined   No - Patient declined    Current Medications (verified) Outpatient Encounter Medications as of 11/26/2021  Medication Sig   acetaminophen (TYLENOL) 500 MG tablet Take 500 mg by mouth every 6 (six)  hours as needed.   Ascorbic Acid (VITAMIN C) 1000 MG tablet Take 1,000 mg by mouth daily.   benazepril (LOTENSIN) 40 MG tablet Take 1 tablet by mouth once daily   benzonatate (TESSALON) 200 MG capsule Take 200 mg by mouth 3 (three) times daily as needed for cough.   Blood Glucose Monitoring Suppl (ONETOUCH VERIO REFLECT) w/Device KIT USE UP TO FOUR TIMES DAILY AS DIRECTED.   Cholecalciferol (VITAMIN D) 2000 UNITS CAPS Take 4,000 Units by mouth daily.    cyanocobalamin 2000 MCG tablet Take 2,000 mcg by mouth daily.   famotidine (PEPCID) 40 MG tablet TAKE 1 TABLET BY MOUTH AT BEDTIME   fexofenadine (ALLEGRA) 180 MG tablet Take 180 mg by mouth daily as needed for allergies.   fluticasone (FLONASE) 50 MCG/ACT nasal spray Place 1 spray into both nostrils daily.   furosemide (LASIX) 20 MG tablet Take 1 tablet (20 mg total) by mouth daily as needed.   glipiZIDE (GLUCOTROL XL) 5 MG 24 hr tablet Take 1 tablet by mouth once daily   glucose blood (ONETOUCH VERIO) test strip USE STRIP TO CHECK GLUCOSE 2 TO 4 TIMES DAILY AS NEEDED FOR GLUCOSE CONTROL   hydrochlorothiazide (HYDRODIURIL) 12.5 MG tablet Take 1 tablet by mouth once daily   HYDROcodone-acetaminophen (NORCO/VICODIN) 5-325 MG tablet Take 1 tablet by mouth daily as needed.   iron polysaccharides (FERREX 150) 150 MG capsule Take 1 capsule (150 mg total) by mouth daily.   Lancets 30G MISC USE TWICE DAILY AS DIRECTED FOR  GLUCOSE TESTING AND MONITORING   Lancets MISC Use as directed   metFORMIN (GLUCOPHAGE-XR) 500 MG 24 hr tablet Take 1 tablet by mouth twice daily   metoprolol succinate (TOPROL-XL) 50 MG 24 hr tablet TAKE 1 TABLET BY MOUTH ONCE DAILY--TAKE WITH OR IMMEDIATELY FOLLOWING A MEALS. APPOINTMENT REQUIRED FOR FUTURE REFILLS   ONE TOUCH LANCETS MISC Test twice daily.   pioglitazone (ACTOS) 45 MG tablet TAKE 1 TABLET BY MOUTH ONCE DAILY **PATIENT  NEEDS  AN  APPOINTMENT  WITH  PCP**   prednisoLONE acetate (PRED FORTE) 1 % ophthalmic suspension  Place 1 drop into both eyes See admin instructions. Place 1 drop into left eye three times daily Place 1 drop into right eye once daily   Probiotic Product (PROBIOTIC-10 PO) Take 1 capsule by mouth daily.   rosuvastatin (CRESTOR) 5 MG tablet Take 1 tablet (5 mg total) by mouth daily.   senna-docusate (SENOKOT-S) 8.6-50 MG tablet Take 1 tablet by mouth at bedtime.   diclofenac Sodium (VOLTAREN) 1 % GEL Apply 2 g topically 4 (four) times daily. (Patient not taking: Reported on 11/26/2021)   pravastatin (PRAVACHOL) 40 MG tablet Take 1 tablet by mouth once daily (Patient not taking: Reported on 11/26/2021)   No facility-administered encounter medications on file as of 11/26/2021.    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   HYPERLIPIDEMIA 06/17/2010   HYPERTENSION 06/17/2010   Leg swelling    LOW BACK PAIN 06/17/2010   OSTEOARTHRITIS 06/17/2010   OSTEOPENIA 06/17/2010   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   Weakness    Past Surgical History:  Procedure Laterality Date   ABDOMINAL HYSTERECTOMY  1988   TAH,LSO   ANTERIOR CERVICAL DECOMP/DISCECTOMY FUSION N/A 09/15/2019   Procedure: Cervical five CorpectomyANTERIOR CERVICAL DECOMPRESSION/DISCECTOMY FUSION CERVICAL THREE-CERVICAL Four;  Surgeon: Ashok Pall, MD;  Location: [facility];  Service: Neurosurgery;  Laterality: N/A;   BREAST LUMPECTOMY Right 2007   LUMPECTOMY FOLLOWED BY RADIATION   COLONOSCOPY     CORNEAL TRANSPLANT Right 2020    CORNEAL TRANSPLANT 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   EYE SURGERY Bilateral    Cataract   HARDWARE REMOVAL N/A 10/21/2019   Procedure: Anterior cervical plate removal with revision;  Surgeon: Ashok Pall, MD;  Location: [facility];  Service: Neurosurgery;  Laterality: N/A;  3C   [place] Bilateral    OOPHORECTOMY  1988   TAH,LSO   REVERSE SHOULDER ARTHROPLASTY Right 09/19/2020   Procedure: REVERSE SHOULDER ARTHROPLASTY;  Surgeon: Hiram Gash, MD;  Location: [facility];  Service: Orthopedics;  Laterality: Right;   TUBAL LIGATION     Family History  Problem Relation Age of Onset   Diabetes Mother    Hypertension Sister    Asthma Father    Diabetes Brother    Social History   Socioeconomic History   Marital status: Married    Spouse name: Not on file   Number of children: 3   Years of education: college   Highest  education level: Not on file  Occupational History   Not on file  Tobacco Use   Smoking status: Never   Smokeless tobacco: Never  Vaping Use   Vaping Use: Never used  Substance and Sexual Activity   Alcohol use: No   Drug use: No   Sexual activity: Never    Birth control/protection: Post-menopausal, Surgical    Comment: Tubal Ligation  Other Topics Concern   Not on file  Social History Narrative   Retired Psychologist, counselling   Social Determinants of Radio broadcast assistant Strain: Low Risk    Difficulty of Paying Living Expenses: Not hard at all  Food Insecurity: No Food Insecurity   Worried About Charity fundraiser in the Last Year: Never true   Arboriculturist in the Last Year: Never true  Transportation Needs: No Transportation Needs   Lack of Transportation (Medical): No   Lack of Transportation (Non-Medical): No  Physical Activity: Insufficiently Active   Days of Exercise per Week: 3 days   Minutes of Exercise per Session: 20 min  Stress: No Stress Concern Present   Feeling of Stress : Not at all  Social Connections: Not on file    Tobacco Counseling Counseling  given: Not Answered   Clinical Intake:  Pre-visit preparation completed: Yes  Pain : No/denies pain     Nutritional Status: BMI > 30  Obese Nutritional Risks: None Diabetes: Yes  How often do you need to have someone help you when you read instructions, pamphlets, or other written materials from your doctor or pharmacy?: 1 - Never What is the last grade level you completed in school?: some college  Diabetic? Yes Nutrition Risk Assessment:  Has the patient had any N/V/D within the last 2 months?  No  Does the patient have any non-healing wounds?  No  Has the patient had any unintentional weight loss or weight gain?  No   Diabetes:  Is the patient diabetic?  Yes  If diabetic, was a CBG obtained today?  No  Did the patient bring in their glucometer from home?  No  How often do you monitor your CBG's? Twice daily.   Financial Strains and Diabetes Management:  Are you having any financial strains with the device, your supplies or your medication? No .  Does the patient want to be seen by Chronic Care Management for management of their diabetes?  No  Would the patient like to be referred to a Nutritionist or for Diabetic Management?  No   Diabetic Exams:  Diabetic Eye Exam: Completed 10/02/2021 Diabetic Foot Exam: Completed 10/28/2021   Interpreter Needed?: No  Information entered by :: NAllen LPN   Activities of Daily Living    11/26/2021    3:12 PM  In your present state of health, do you have any difficulty performing the following activities:  Hearing? 0  Vision? 0  Difficulty concentrating or making decisions? 0  Walking or climbing stairs? 1  Dressing or bathing? 0  Doing errands, shopping? 1  Comment does not drive  Preparing Food and eating ? N  Using the Toilet? N  In the past six months, have you accidently leaked urine? Y  Do you have problems with loss of bowel control? N  Managing your Medications? N  Managing your Finances? N  Housekeeping or  managing your Housekeeping? N    Patient Care Team: Caren Macadam, MD as PCP - General (Family Medicine) Viona Gilmore, [facility] as Pharmacist (  Pharmacist) Thana Ates, RN as [place], [place], OT as Occupational Therapist (Occupational Therapy)  Indicate any recent [place] you may have received from other than [facility] providers in the past year (date may be approximate).     Assessment:   This is a routine wellness examination for [facility].  Hearing/Vision screen Vision Screening - Comments:: Regular eye exams, Dr. [place]  Dietary issues and exercise activities discussed: Current Exercise Habits: Home exercise routine, Type of exercise: walking, Time (Minutes): 20, Frequency (Times/Week): 3, Weekly Exercise (Minutes/Week): 60   Goals Addressed             This Visit's Progress    Patient Stated       11/26/2021, wants to drink more water       Depression Screen    11/26/2021    3:12 PM 07/15/2021   11:33 AM 06/12/2021    1:20 PM 11/13/2020    3:25 PM 05/25/2018    2:36 PM 04/19/2018    2:14 PM 02/26/2016    2:22 PM  PHQ 2/9 Scores  PHQ - 2 Score 0 0 0 0 0 0 0    Fall Risk    11/26/2021    3:11 PM 07/03/2021    5:00 PM 11/13/2020    3:27 PM 05/25/2018    2:36 PM 04/19/2018    2:14 PM  [place] in the past year? 0 1 1 0 No  Number falls in past yr: 0 0 1 0   Injury with Fall? 0 1 1 0   Comment   [place] surgery    Risk for fall due to : Impaired mobility;Medication side effect History of fall(s);Impaired balance/gait;Impaired mobility History of fall(s);Impaired balance/gait;Impaired mobility;Impaired vision    Follow up Falls evaluation completed;Education provided;Falls prevention discussed  Falls prevention discussed      FALL RISK PREVENTION PERTAINING TO THE HOME:  Any stairs in or around the home? Yes  If so, are there any without handrails? No  Home free of loose throw rugs in walkways, pet  beds, electrical cords, etc? Yes  Adequate lighting in your home to reduce risk of falls? Yes   ASSISTIVE DEVICES UTILIZED TO PREVENT FALLS:  Life alert? No  Use of a cane, walker or w/c? Yes  Grab bars in the bathroom? Yes  Shower chair or bench in shower? Yes  Elevated toilet seat or a handicapped toilet? No   TIMED UP AND GO:  Was the test performed? No .      Cognitive Function:        11/26/2021    3:15 PM 11/13/2020    3:38 PM  6CIT Screen  What Year? 0 points 0 points  What month? 0 points 0 points  What time? 0 points   Count back from 20 0 points 0 points  Months in reverse 0 points 0 points  Repeat phrase 0 points 10 points  Total Score 0 points     Immunizations Immunization History  Administered Date(s) Administered   Fluad Quad(high Dose 65+) 04/28/2019, 03/21/2020, 03/19/2021   Influenza Split 04/30/2011, 04/28/2012, 04/13/2013   Influenza Whole 04/02/2010   Influenza, High Dose Seasonal PF 04/10/2015, 04/16/2016, 12/10/2016, 04/13/2017, 04/15/2018, 08/04/2018, 04/19/2019, 04/18/2020   Influenza,inj,Quad PF,6+ Mos 04/06/2014   PFIZER(Purple Top)SARS-COV-2 Vaccination 08/01/2019, 08/14/2019, 04/18/2020   PNEUMOCOCCAL CONJUGATE-20 05/13/2021   PPD Test 10/15/2010, 10/15/2010, 10/17/2010, 10/27/2011, 12/13/2012, 11/13/2015, 04/25/2019   Pneumococcal Conjugate-13 07/12/2014   Pneumococcal Polysaccharide-23 03/23/2019  TDAP status: Due, Education has been provided regarding the importance of this vaccine. Advised may receive this vaccine at local pharmacy or Health Dept. Aware to provide a copy of the vaccination record if obtained from local pharmacy or Health Dept. Verbalized acceptance and understanding.  Flu Vaccine status: Up to date  Pneumococcal vaccine status: Up to date  Covid-19 vaccine status: Completed vaccines  Qualifies for Shingles Vaccine? Yes   Zostavax completed No   Shingrix Completed?: No.    Education has been provided  regarding the importance of this vaccine. Patient has been advised to call insurance company to determine out of pocket expense if they have not yet received this vaccine. Advised may also receive vaccine at local pharmacy or Health Dept. Verbalized acceptance and understanding.  Screening Tests Health Maintenance  Topic Date Due   PAP SMEAR-Modifier  09/21/2013   COVID-19 Vaccine (4 - Booster) 06/13/2020   TETANUS/TDAP  01/28/2022 (Originally 01/22/1960)   Zoster Vaccines- Shingrix (1 of 2) 01/28/2022 (Originally 01/22/1960)   INFLUENZA VACCINE  01/28/2022   HEMOGLOBIN A1C  04/30/2022   MAMMOGRAM  05/10/2022   OPHTHALMOLOGY EXAM  10/03/2022   FOOT EXAM  10/29/2022   Pneumonia Vaccine 18+ Years old  Completed   DEXA SCAN  Completed   Hepatitis C Screening  Completed   HPV VACCINES  Aged Out    Health Maintenance  Health Maintenance Due  Topic Date Due   PAP SMEAR-Modifier  09/21/2013   COVID-19 Vaccine (4 - Booster) 06/13/2020    Colorectal cancer screening: No longer required.   Mammogram status: Completed 05/10/2021. Repeat every year  Bone Density status: Completed 05/10/2021.   Lung Cancer Screening: (Low Dose CT Chest recommended if Age 52-80 years, 30 pack-year currently smoking OR have quit w/in 15years.) does not qualify.   Lung Cancer Screening Referral: no  Additional Screening:  Hepatitis C Screening: does qualify; Completed 07/02/2017  Vision Screening: Recommended annual ophthalmology exams for early detection of glaucoma and other disorders of the eye. Is the patient up to date with their annual eye exam?  Yes  Who is the provider or what is the name of the office in which the patient attends annual eye exams? Dr. [place] If pt is not established with a provider, would they like to be referred to a provider to establish care? No .   Dental Screening: Recommended annual dental exams for proper oral hygiene  Community Resource Referral / Chronic Care  Management: CRR required this visit?  No   CCM required this visit?  No      Plan:     I have personally reviewed and noted the following in the patient's chart:   Medical and social history Use of alcohol, tobacco or illicit drugs  Current medications and supplements including opioid prescriptions.  Functional ability and status Nutritional status Physical activity Advanced directives List of other physicians Hospitalizations, surgeries, and ER visits in previous 12 months Vitals Screenings to include cognitive, depression, and falls Referrals and appointments  In addition, I have reviewed and discussed with patient certain preventive protocols, quality metrics, and best practice recommendations. A written personalized care plan for preventive services as well as general preventive health recommendations were provided to patient.     Kellie Simmering, LPN   [id number]/[id number]   Nurse Notes: none  Due to this being a virtual visit, the after visit summary with patients personalized plan was offered to patient via mail or my-chart. per request, patient was mailed a copy  of AVS.

## 2021-11-26 NOTE — Patient Instructions (Signed)
Doris Jackson , Thank you for taking time to come for your Medicare Wellness Visit. I appreciate your ongoing commitment to your health goals. Please review the following plan we discussed and let me know if I can assist you in the future.   Screening recommendations/referrals: Colonoscopy: not required Mammogram: completed 05/10/2021, due 05/11/2022 Bone Density: completed 05/10/2021 Recommended yearly ophthalmology/optometry visit for glaucoma screening and checkup Recommended yearly dental visit for hygiene and checkup  Vaccinations: Influenza vaccine: due 01/28/2022 Pneumococcal vaccine: completed 05/13/2021 Tdap vaccine: due Shingles vaccine: discussed   Covid-19: 04/18/2020, 08/14/2019, 08/01/2019  Advanced directives: Advance directive discussed with you today.   Conditions/risks identified: none  Next appointment: Follow up in one year for your annual wellness visit    Preventive Care 65 Years and Older, Female Preventive care refers to lifestyle choices and visits with your health care provider that can promote health and wellness. What does preventive care include? A yearly physical exam. This is also called an annual well check. Dental exams once or twice a year. Routine eye exams. Ask your health care provider how often you should have your eyes checked. Personal lifestyle choices, including: Daily care of your teeth and gums. Regular physical activity. Eating a healthy diet. Avoiding tobacco and drug use. Limiting alcohol use. Practicing safe sex. Taking low-dose aspirin every day. Taking vitamin and mineral supplements as recommended by your health care provider. What happens during an annual well check? The services and screenings done by your health care provider during your annual well check will depend on your age, overall health, lifestyle risk factors, and family history of disease. Counseling  Your health care provider may ask you questions about your: Alcohol  use. Tobacco use. Drug use. Emotional well-being. Home and relationship well-being. Sexual activity. Eating habits. History of falls. Memory and ability to understand (cognition). Work and work Statistician. Reproductive health. Screening  You may have the following tests or measurements: Height, weight, and BMI. Blood pressure. Lipid and cholesterol levels. These may be checked every 5 years, or more frequently if you are over 33 years old. Skin check. Lung cancer screening. You may have this screening every year starting at age 82 if you have a 30-pack-year history of smoking and currently smoke or have quit within the past 15 years. Fecal occult blood test (FOBT) of the stool. You may have this test every year starting at age 5. Flexible sigmoidoscopy or colonoscopy. You may have a sigmoidoscopy every 5 years or a colonoscopy every 10 years starting at age 14. Hepatitis C blood test. Hepatitis B blood test. Sexually transmitted disease (STD) testing. Diabetes screening. This is done by checking your blood sugar (glucose) after you have not eaten for a while (fasting). You may have this done every 1-3 years. Bone density scan. This is done to screen for osteoporosis. You may have this done starting at age 22. Mammogram. This may be done every 1-2 years. Talk to your health care provider about how often you should have regular mammograms. Talk with your health care provider about your test results, treatment options, and if necessary, the need for more tests. Vaccines  Your health care provider may recommend certain vaccines, such as: Influenza vaccine. This is recommended every year. Tetanus, diphtheria, and acellular pertussis (Tdap, Td) vaccine. You may need a Td booster every 10 years. Zoster vaccine. You may need this after age 45. Pneumococcal 13-valent conjugate (PCV13) vaccine. One dose is recommended after age 76. Pneumococcal polysaccharide (PPSV23) vaccine. One dose is  recommended after age 63. Talk to your health care provider about which screenings and vaccines you need and how often you need them. This information is not intended to replace advice given to you by your health care provider. Make sure you discuss any questions you have with your health care provider. Document Released: 07/13/2015 Document Revised: 03/05/2016 Document Reviewed: 04/17/2015 Elsevier Interactive Patient Education  2017 [place] Prevention in the Home Falls can cause injuries. They can happen to people of all ages. There are many things you can do to make your home safe and to help prevent falls. What can I do on the outside of my home? Regularly fix the edges of walkways and driveways and fix any cracks. Remove anything that might make you trip as you walk through a door, such as a raised step or threshold. Trim any bushes or trees on the path to your home. Use bright outdoor lighting. Clear any walking paths of anything that might make someone trip, such as rocks or tools. Regularly check to see if handrails are loose or broken. Make sure that both sides of any steps have handrails. Any raised decks and porches should have guardrails on the edges. Have any leaves, snow, or ice cleared regularly. Use sand or salt on walking paths during winter. Clean up any spills in your garage right away. This includes oil or grease spills. What can I do in the bathroom? Use night lights. Install grab bars by the toilet and in the tub and shower. Do not use towel bars as grab bars. Use non-skid mats or decals in the tub or shower. If you need to sit down in the shower, use a plastic, non-slip stool. Keep the floor dry. Clean up any water that spills on the floor as soon as it happens. Remove soap buildup in the tub or shower regularly. Attach bath mats securely with double-sided non-slip rug tape. Do not have throw rugs and other things on the floor that can make you  trip. What can I do in the bedroom? Use night lights. Make sure that you have a light by your bed that is easy to reach. Do not use any sheets or blankets that are too big for your bed. They should not hang down onto the floor. Have a firm chair that has side arms. You can use this for support while you get dressed. Do not have throw rugs and other things on the floor that can make you trip. What can I do in the kitchen? Clean up any spills right away. Avoid walking on wet floors. Keep items that you use a lot in easy-to-reach places. If you need to reach something above you, use a strong step stool that has a grab bar. Keep electrical cords out of the way. Do not use floor polish or wax that makes floors slippery. If you must use wax, use non-skid floor wax. Do not have throw rugs and other things on the floor that can make you trip. What can I do with my stairs? Do not leave any items on the stairs. Make sure that there are handrails on both sides of the stairs and use them. Fix handrails that are broken or loose. Make sure that handrails are as long as the stairways. Check any carpeting to make sure that it is firmly attached to the stairs. Fix any carpet that is loose or worn. Avoid having throw rugs at the top or bottom of the stairs. If you  do have throw rugs, attach them to the floor with carpet tape. Make sure that you have a light switch at the top of the stairs and the bottom of the stairs. If you do not have them, ask someone to add them for you. What else can I do to help prevent falls? Wear shoes that: Do not have high heels. Have rubber bottoms. Are comfortable and fit you well. Are closed at the toe. Do not wear sandals. If you use a stepladder: Make sure that it is fully opened. Do not climb a closed stepladder. Make sure that both sides of the stepladder are locked into place. Ask someone to hold it for you, if possible. Clearly mark and make sure that you can  see: Any grab bars or handrails. First and last steps. Where the edge of each step is. Use tools that help you move around (mobility aids) if they are needed. These include: Canes. Walkers. Scooters. Crutches. Turn on the lights when you go into a dark area. Replace any light bulbs as soon as they burn out. Set up your furniture so you have a clear path. Avoid moving your furniture around. If any of your floors are uneven, fix them. If there are any pets around you, be aware of where they are. Review your medicines with your doctor. Some medicines can make you feel dizzy. This can increase your chance of falling. Ask your doctor what other things that you can do to help prevent falls. This information is not intended to replace advice given to you by your health care provider. Make sure you discuss any questions you have with your health care provider. Document Released: 04/12/2009 Document Revised: 11/22/2015 Document Reviewed: 07/21/2014 Elsevier Interactive Patient Education  2017 [employer].

## 2021-12-04 DIAGNOSIS — J301 Allergic rhinitis due to pollen: Secondary | ICD-10-CM | POA: Diagnosis not present

## 2021-12-04 DIAGNOSIS — J3081 Allergic rhinitis due to animal (cat) (dog) hair and dander: Secondary | ICD-10-CM | POA: Diagnosis not present

## 2021-12-04 DIAGNOSIS — J3089 Other allergic rhinitis: Secondary | ICD-10-CM | POA: Diagnosis not present

## 2021-12-04 DIAGNOSIS — Z961 Presence of intraocular lens: Secondary | ICD-10-CM | POA: Diagnosis not present

## 2021-12-06 ENCOUNTER — Telehealth: Payer: Self-pay

## 2021-12-06 NOTE — Telephone Encounter (Signed)
Patient called into the office in regards to her [place] disability transportation paperwork. This paperwork was submitted by Dr. Ethlyn Gallery for approval. The patient received a letter stating the application was incomplete. [place] is needing Cognitive disability information. They would like the information faxed by 12/20/2021 fax number [phone].  Darrell Jewel also can be contacted at [phone]

## 2021-12-09 NOTE — Telephone Encounter (Signed)
Left a detailed message on Doris Jackson's voicemail requesting she fax the form to our office for review by Padonda to see if she will complete this when she returns to the office on Thursday.

## 2021-12-11 DIAGNOSIS — I890 Lymphedema, not elsewhere classified: Secondary | ICD-10-CM | POA: Diagnosis not present

## 2021-12-11 NOTE — Telephone Encounter (Signed)
Left a detailed message on Courtney's voicemail following up on the request below as I have not received a fax as of today.

## 2021-12-12 NOTE — Telephone Encounter (Signed)
Doris Jackson called back and stated the paperwork that was received noted the patient has leg weakness and they need further information as to what has caused this?  Doris Jackson stated she will fax the form to have this information added and faxed back.

## 2021-12-18 NOTE — Telephone Encounter (Signed)
Pt is call back about the form that was sent over for stat and stated it need to be sent back by the 12/20/21/mm

## 2021-12-19 NOTE — Telephone Encounter (Signed)
LVM on Cour[facility] VM to determine if form was faxed to office.

## 2021-12-23 ENCOUNTER — Telehealth: Payer: Self-pay | Admitting: Family Medicine

## 2021-12-23 NOTE — Telephone Encounter (Signed)
Patient states [place] paperwork hasn't been received at the [facility] office.  She is requesting a call back.

## 2021-12-24 NOTE — Telephone Encounter (Signed)
Fax was retrieved from E-fax and printed off by [facility].  I called the patient and left a detailed message stating the fax was received and will be given to [facility] to review when she comes in to the office on Wednesday and she will be contacted if there is a problem and/or once completed.

## 2021-12-25 DIAGNOSIS — J3081 Allergic rhinitis due to animal (cat) (dog) hair and dander: Secondary | ICD-10-CM | POA: Diagnosis not present

## 2021-12-25 DIAGNOSIS — J3089 Other allergic rhinitis: Secondary | ICD-10-CM | POA: Diagnosis not present

## 2021-12-25 DIAGNOSIS — J301 Allergic rhinitis due to pollen: Secondary | ICD-10-CM | POA: Diagnosis not present

## 2021-12-27 NOTE — Telephone Encounter (Signed)
Doris Jackson reviewed the fax and stated this information was previously completed by Dr Ethlyn Gallery.  Doris Jackson searched the chart as I was not able to find the form and she found this under "patient self inventory".  The form was printed and part B was faxed to [employer] [place] at [phone] attn: Courtney.  Note was also added to the fax stating this part of the form was previously completed and given to the patient's husband to sent to their office.  Spoke with the patient and informed her of this.  Patient also stated Dr Ethlyn Gallery recommended she take 1/2-Famotidine '40mg'$  at bedtime and she has had problems cutting this in half with a knife.  I advised the patient she should purchase an actual pill cutter to use to get the accurate dose.  Patient requested a new Rx for Famotidine '20mg'$ -QHS instead be sent to [facility] and the message sent to [employer].

## 2022-01-10 DIAGNOSIS — I890 Lymphedema, not elsewhere classified: Secondary | ICD-10-CM | POA: Diagnosis not present

## 2022-01-13 ENCOUNTER — Other Ambulatory Visit: Payer: Self-pay | Admitting: *Deleted

## 2022-01-13 MED ORDER — BENAZEPRIL HCL 40 MG PO TABS: 40.0000 mg | ORAL_TABLET | Freq: Every day | ORAL | 0 refills | Status: DC

## 2022-01-13 MED ORDER — FAMOTIDINE 40 MG PO TABS: 40.0000 mg | ORAL_TABLET | Freq: Every day | ORAL | 0 refills | Status: DC

## 2022-01-13 MED ORDER — PIOGLITAZONE HCL 45 MG PO TABS: ORAL_TABLET | ORAL | 0 refills | Status: DC

## 2022-01-15 DIAGNOSIS — J301 Allergic rhinitis due to pollen: Secondary | ICD-10-CM | POA: Diagnosis not present

## 2022-01-15 DIAGNOSIS — J3089 Other allergic rhinitis: Secondary | ICD-10-CM | POA: Diagnosis not present

## 2022-01-15 DIAGNOSIS — J3081 Allergic rhinitis due to animal (cat) (dog) hair and dander: Secondary | ICD-10-CM | POA: Diagnosis not present

## 2022-01-20 ENCOUNTER — Telehealth: Payer: Self-pay | Admitting: Family Medicine

## 2022-01-20 NOTE — Telephone Encounter (Signed)
Last Rx given on 5/1 for #30 with no ref

## 2022-01-20 NOTE — Telephone Encounter (Signed)
Pt requesting a refill HYDROcodone-acetaminophen (NORCO/VICODIN) 5-325 MG tablet   [place], [address] Phone:  [phone]  Fax:  [phone]

## 2022-01-21 NOTE — Telephone Encounter (Signed)
Patient informed of the message below and an appt was scheduled on 8/1.

## 2022-01-25 ENCOUNTER — Other Ambulatory Visit: Payer: Self-pay | Admitting: Occupational Therapy

## 2022-01-25 NOTE — Patient Outreach (Signed)
Aging Gracefully Program  OT FINAL Visit  01/25/2022  Doris Jackson 1941-05-08 [id number]  Visit:  4- Fourth Visit  Start Time:  3953 End Time:  2023 Total Minutes:  20  Readiness to Change:  Readiness to Change Score: 10  Patient Education: Education Provided: Yes Education Details: Aging at [employer]) Educated: Patient Comprehension: Verbalized Understanding  Post Clinical Reasoning: Public affairs consultant Of Client Situation:: Doris Jackson continues to get around her house and do what she needs to to take care of herself and the household with some assistance from her husband for those tasks she can't do. All of her goals have been met and all the OT education has been completed. Client View Of His/Her Situation:: She feels she is doing well for herself. She just turned 81yo this week.She does mention generalized pain and is to see her new primary care doctor soone and will discuss this.She is very appreciative of what the Aging Gracefully program has done for her. Next Visit Plan:: This is the last OT visit with Ms. Donnal Debar, OTR/L [facility] Aging Gracefully [phone] Office [phone]

## 2022-01-28 ENCOUNTER — Ambulatory Visit: Payer: Medicare (Managed Care) | Admitting: Family Medicine

## 2022-01-31 ENCOUNTER — Other Ambulatory Visit: Payer: Self-pay | Admitting: *Deleted

## 2022-01-31 MED ORDER — HYDROCHLOROTHIAZIDE 12.5 MG PO TABS: 12.5000 mg | ORAL_TABLET | Freq: Every day | ORAL | 0 refills | Status: DC

## 2022-02-04 ENCOUNTER — Ambulatory Visit (INDEPENDENT_AMBULATORY_CARE_PROVIDER_SITE_OTHER): Payer: Medicare (Managed Care) | Admitting: Family Medicine

## 2022-02-04 ENCOUNTER — Encounter: Payer: Self-pay | Admitting: Family Medicine

## 2022-02-04 VITALS — BP 142/70 | HR 94 | Temp 98.0°F | Ht 62.0 in | Wt 237.9 lb

## 2022-02-04 DIAGNOSIS — I10 Essential (primary) hypertension: Secondary | ICD-10-CM

## 2022-02-04 DIAGNOSIS — M5441 Lumbago with sciatica, right side: Secondary | ICD-10-CM

## 2022-02-04 DIAGNOSIS — M159 Polyosteoarthritis, unspecified: Secondary | ICD-10-CM | POA: Diagnosis not present

## 2022-02-04 DIAGNOSIS — K219 Gastro-esophageal reflux disease without esophagitis: Secondary | ICD-10-CM

## 2022-02-04 DIAGNOSIS — J301 Allergic rhinitis due to pollen: Secondary | ICD-10-CM | POA: Diagnosis not present

## 2022-02-04 DIAGNOSIS — E1129 Type 2 diabetes mellitus with other diabetic kidney complication: Secondary | ICD-10-CM

## 2022-02-04 DIAGNOSIS — G8929 Other chronic pain: Secondary | ICD-10-CM

## 2022-02-04 DIAGNOSIS — J3089 Other allergic rhinitis: Secondary | ICD-10-CM | POA: Diagnosis not present

## 2022-02-04 DIAGNOSIS — M48061 Spinal stenosis, lumbar region without neurogenic claudication: Secondary | ICD-10-CM

## 2022-02-04 LAB — POCT GLYCOSYLATED HEMOGLOBIN (HGB A1C): Hemoglobin A1C: 6.8 % — AB (ref 4.0–5.6)

## 2022-02-04 MED ORDER — METFORMIN HCL ER 500 MG PO TB24: 500.0000 mg | ORAL_TABLET | Freq: Two times a day (BID) | ORAL | 1 refills | Status: DC

## 2022-02-04 MED ORDER — METOPROLOL SUCCINATE ER 50 MG PO TB24
50.0000 mg | ORAL_TABLET | Freq: Every day | ORAL | 1 refills | Status: DC
Start: 2022-02-04 — End: 2022-08-14

## 2022-02-04 MED ORDER — FAMOTIDINE 20 MG PO TABS: 20.0000 mg | ORAL_TABLET | Freq: Every day | ORAL | 1 refills | Status: DC

## 2022-02-04 MED ORDER — HYDROCODONE-ACETAMINOPHEN 5-325 MG PO TABS: 1.0000 | ORAL_TABLET | Freq: Every day | ORAL | 0 refills | Status: DC | PRN

## 2022-02-04 MED ORDER — GLIPIZIDE ER 5 MG PO TB24: 5.0000 mg | ORAL_TABLET | Freq: Every day | ORAL | 1 refills | Status: DC

## 2022-02-04 MED ORDER — HYDROCODONE-ACETAMINOPHEN 5-325 MG PO TABS: 1.0000 | ORAL_TABLET | Freq: Four times a day (QID) | ORAL | 0 refills | Status: DC | PRN

## 2022-02-04 NOTE — Progress Notes (Unsigned)
Established Patient Office Visit  Subjective   Patient ID: Doris Jackson, female    DOB: May 22, 1941  Age: 81 y.o. MRN: [id number]  Chief Complaint  Patient presents with   Establish Care   Back Pain    Patient complains of low back pain xyears, requests to discuss options as Hydrocodone was previously denied   Medication Refill    Patient requests a new prescription for Famotidine '20mg'$  instead of '40mg'$ , which she was taking half    Patient is here to discuss her chronic back pain. States that she has a long history of back and cervical spine issues, she underwent cervical spine fusion  Back Pain This is a chronic problem. The current episode started more than 1 year ago. The pain is present in the lumbar spine. The quality of the pain is described as shooting. The pain does not radiate. The pain is moderate. The pain is Worse during the night. The symptoms are aggravated by lying down. Stiffness is present In the morning and at night. Risk factors include obesity and sedentary lifestyle. She has tried analgesics for the symptoms. The treatment provided moderate relief.  Medication Refill    {History (Optional):23778}  Review of Systems  Musculoskeletal:  Positive for back pain.  All other systems reviewed and are negative.     Objective:     BP (!) 142/70 (BP Location: Left Arm, Patient Position: Sitting, Cuff Size: Large)   Pulse 94   Temp 98 F (36.7 C) (Oral)   Ht '5\' 2"'$  (1.575 m)   Wt 237 lb 14.4 oz (107.9 kg)   SpO2 96%   BMI 43.51 kg/m  BP Readings from Last 3 Encounters:  02/04/22 (!) 142/70  10/28/21 132/80  05/13/21 110/60      Physical Exam Vitals reviewed.  Constitutional:      Appearance: Normal appearance. She is well-groomed and normal weight.  HENT:     Head: Normocephalic and atraumatic.     Mouth/Throat:     Mouth: Mucous membranes are moist.     Pharynx: Oropharynx is clear.  Eyes:     Extraocular Movements: Extraocular movements intact.      Conjunctiva/sclera: Conjunctivae normal.     Pupils: Pupils are equal, round, and reactive to light.  Cardiovascular:     Rate and Rhythm: Normal rate and regular rhythm.     Pulses: Normal pulses.     Heart sounds: S1 normal and S2 normal.  Pulmonary:     Effort: Pulmonary effort is normal.     Breath sounds: Normal breath sounds and air entry.  Abdominal:     General: Abdomen is flat. Bowel sounds are normal.     Palpations: Abdomen is soft.  Musculoskeletal:        General: Normal range of motion.     Cervical back: Normal range of motion and neck supple.     Right lower leg: No edema.     Left lower leg: No edema.  Skin:    General: Skin is warm and dry.  Neurological:     Mental Status: She is alert and oriented to person, place, and time. Mental status is at baseline.     Gait: Gait is intact.  Psychiatric:        Mood and Affect: Mood and affect normal.        Speech: Speech normal.        Behavior: Behavior normal.        Judgment: Judgment normal.  Results for orders placed or performed in visit on 02/04/22  POC HgB A1c  Result Value Ref Range   Hemoglobin A1C 6.8 (A) 4.0 - 5.6 %   HbA1c POC (<> result, manual entry)     HbA1c, POC (prediabetic range)     HbA1c, POC (controlled diabetic range)      {Labs (Optional):23779}  The ASCVD Risk score (Arnett DK, et al., 2019) failed to calculate for the following reasons:   The 2019 ASCVD risk score is only valid for ages 90 to 86    Assessment & Plan:   Problem List Items Addressed This Visit       Cardiovascular and Mediastinum   Essential hypertension   Relevant Medications   metoprolol succinate (TOPROL-XL) 50 MG 24 hr tablet     Digestive   GERD (gastroesophageal reflux disease)   Relevant Medications   famotidine (PEPCID) 20 MG tablet     Endocrine   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    Farrel Conners, MD

## 2022-02-05 NOTE — Assessment & Plan Note (Addendum)
Severe findings on MRI lumbar spine in 2019 as noted above in the HPI. She cannot take NSAIDS due to her CKD stage 3,  Asked about prior consultations with pain management, she reports she is worried about getting injections for her pain, is somewhat afraid of needles. We had a long discussion about the risks and benefits of using chronic narcotics in the treatment of her chronic pain, that it is not recommended to use long term due to the risks of falls, disorientation/sedation, dependency and tolerance to the medication. Patient states she only takes them when the pain is severe and they work very well for her. I reviewed her PDMP data as well. Since she is only taking them on a PRN basis, I will give her 15 tablets to be use as needed per month. If in the future she requires more than this to manage her pain then she will be referred to pain management for further treatment and management given the risks in her age group in taking this medication long term.

## 2022-02-05 NOTE — Assessment & Plan Note (Signed)
Patient forgot to take her medications so BP in office today is uncontrolled. I advised that she take her medication daily as prescribed, will recheck BP at her next visit. Continue her current medications listed above in the HPI.

## 2022-02-05 NOTE — Assessment & Plan Note (Signed)
Pt requesting to reduce her dose of famotidine to 20 mg daily, new script written for patient, sx are stable on the 20 mg daily.

## 2022-02-05 NOTE — Assessment & Plan Note (Signed)
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 Has been on chronic narcotics in the past, she cannot take NSAIDS due to her CKD stage 3, tylenol only partially effective at controlling her pain. See below.

## 2022-02-05 NOTE — Assessment & Plan Note (Addendum)
A1C is controlled on metformin 500 mg BID, glipizidie 5 mg daily and actos 45 mg daily. Reviewed with patient in office today, follow up A1C every 6 months. We could consider starting farxiga or jardiance as a better alternative to the glipizide given her CKD stage 3 findings.... will discuss with patient if her A1C becomes uncontrolled in the future.

## 2022-02-07 ENCOUNTER — Other Ambulatory Visit: Payer: Self-pay | Admitting: *Deleted

## 2022-02-07 MED ORDER — ONETOUCH VERIO VI STRP: ORAL_STRIP | 1 refills | Status: DC

## 2022-02-07 NOTE — Telephone Encounter (Signed)
Rx done. 

## 2022-02-10 DIAGNOSIS — J3081 Allergic rhinitis due to animal (cat) (dog) hair and dander: Secondary | ICD-10-CM | POA: Diagnosis not present

## 2022-02-10 DIAGNOSIS — J3089 Other allergic rhinitis: Secondary | ICD-10-CM | POA: Diagnosis not present

## 2022-02-10 DIAGNOSIS — J301 Allergic rhinitis due to pollen: Secondary | ICD-10-CM | POA: Diagnosis not present

## 2022-02-17 DIAGNOSIS — J3089 Other allergic rhinitis: Secondary | ICD-10-CM | POA: Diagnosis not present

## 2022-02-17 DIAGNOSIS — J3081 Allergic rhinitis due to animal (cat) (dog) hair and dander: Secondary | ICD-10-CM | POA: Diagnosis not present

## 2022-02-17 DIAGNOSIS — J301 Allergic rhinitis due to pollen: Secondary | ICD-10-CM | POA: Diagnosis not present

## 2022-03-14 ENCOUNTER — Other Ambulatory Visit: Payer: Self-pay

## 2022-03-14 NOTE — Patient Outreach (Signed)
Aging Gracefully Program  03/14/2022  Doris Jackson [birth year] [id number]   [facility] Evaluation Interviewer made contact with patient. Aging Gracefully 5 month survey completed.   Sent Email to [facility]  [place] Management Assistant [phone]

## 2022-03-25 ENCOUNTER — Ambulatory Visit (INDEPENDENT_AMBULATORY_CARE_PROVIDER_SITE_OTHER): Payer: Medicare (Managed Care) | Admitting: *Deleted

## 2022-03-25 DIAGNOSIS — Z23 Encounter for immunization: Secondary | ICD-10-CM

## 2022-04-09 ENCOUNTER — Other Ambulatory Visit: Payer: Self-pay | Admitting: Family

## 2022-04-11 ENCOUNTER — Other Ambulatory Visit: Payer: Self-pay | Admitting: *Deleted

## 2022-04-14 ENCOUNTER — Other Ambulatory Visit: Payer: Self-pay | Admitting: *Deleted

## 2022-04-14 ENCOUNTER — Other Ambulatory Visit: Payer: Self-pay | Admitting: Family

## 2022-04-14 NOTE — Telephone Encounter (Signed)
We stopped this medication due to muscle cramps at the last visit.

## 2022-04-16 ENCOUNTER — Telehealth: Payer: Self-pay | Admitting: Family Medicine

## 2022-04-16 MED ORDER — PIOGLITAZONE HCL 45 MG PO TABS: ORAL_TABLET | ORAL | 0 refills | Status: DC

## 2022-04-16 MED ORDER — BENAZEPRIL HCL 40 MG PO TABS: 40.0000 mg | ORAL_TABLET | Freq: Every day | ORAL | 0 refills | Status: DC

## 2022-04-16 NOTE — Telephone Encounter (Signed)
Ok to refill 

## 2022-04-16 NOTE — Telephone Encounter (Signed)
Spoke with the patient and informed her the Rxs were sent as below.  Patient stated Rosuvastatin has made her very drowsy, even though she takes this at night, has not taken this in the past week or so, feels better without this and she would like to begin taking Pravastatin '40mg'$  again instead.  States this was discontinued due to the need for a different medication to control cholesterol by Dr Ethlyn Gallery.  Message sent to PCP.

## 2022-04-16 NOTE — Telephone Encounter (Signed)
Pt call and stated she need a refill on pioglitazone (ACTOS) 45 MG tablet and benazepril (LOTENSIN) 40 MG tablet sent to  [facility], [address] Phone:  [phone]  Fax:  [phone]    Also pt want Arville Go to give her a call

## 2022-04-17 ENCOUNTER — Other Ambulatory Visit: Payer: Self-pay | Admitting: Family Medicine

## 2022-04-17 DIAGNOSIS — E785 Hyperlipidemia, unspecified: Secondary | ICD-10-CM

## 2022-04-17 MED ORDER — PRAVASTATIN SODIUM 40 MG PO TABS: 40.0000 mg | ORAL_TABLET | Freq: Every day | ORAL | 3 refills | Status: DC

## 2022-04-17 NOTE — Telephone Encounter (Signed)
Rx sent! Discontinued rosuvastatin

## 2022-04-17 NOTE — Telephone Encounter (Signed)
Patient informed of the message below.

## 2022-05-13 ENCOUNTER — Ambulatory Visit (INDEPENDENT_AMBULATORY_CARE_PROVIDER_SITE_OTHER): Payer: Medicare (Managed Care) | Admitting: Family Medicine

## 2022-05-13 ENCOUNTER — Encounter: Payer: Self-pay | Admitting: Family Medicine

## 2022-05-13 VITALS — BP 138/80 | HR 75 | Temp 97.6°F | Ht 62.0 in | Wt 241.4 lb

## 2022-05-13 DIAGNOSIS — M48061 Spinal stenosis, lumbar region without neurogenic claudication: Secondary | ICD-10-CM

## 2022-05-13 DIAGNOSIS — E1122 Type 2 diabetes mellitus with diabetic chronic kidney disease: Secondary | ICD-10-CM | POA: Diagnosis not present

## 2022-05-13 DIAGNOSIS — N183 Chronic kidney disease, stage 3 unspecified: Secondary | ICD-10-CM

## 2022-05-13 DIAGNOSIS — I10 Essential (primary) hypertension: Secondary | ICD-10-CM | POA: Diagnosis not present

## 2022-05-13 LAB — MICROALBUMIN / CREATININE URINE RATIO
Creatinine,U: 98.1 mg/dL
Microalb Creat Ratio: 1.1 mg/g (ref 0.0–30.0)
Microalb, Ur: 1.1 mg/dL (ref 0.0–1.9)

## 2022-05-13 LAB — BASIC METABOLIC PANEL
BUN: 32 mg/dL — ABNORMAL HIGH (ref 6–23)
CO2: 28 mEq/L (ref 19–32)
Calcium: 9 mg/dL (ref 8.4–10.5)
Chloride: 103 mEq/L (ref 96–112)
Creatinine, Ser: 1.25 mg/dL — ABNORMAL HIGH (ref 0.40–1.20)
GFR: 40.77 mL/min — ABNORMAL LOW (ref >60.00)
Glucose, Bld: 128 mg/dL — ABNORMAL HIGH (ref 70–99)
Potassium: 4.2 mEq/L (ref 3.5–5.1)
Sodium: 138 mEq/L (ref 135–145)

## 2022-05-13 MED ORDER — HYDROCHLOROTHIAZIDE 12.5 MG PO TABS: 12.5000 mg | ORAL_TABLET | Freq: Every day | ORAL | 1 refills | Status: DC

## 2022-05-13 MED ORDER — HYDROCODONE-ACETAMINOPHEN 5-325 MG PO TABS: 1.0000 | ORAL_TABLET | Freq: Every day | ORAL | 0 refills | Status: DC | PRN

## 2022-05-13 MED ORDER — EMPAGLIFLOZIN 10 MG PO TABS: 10.0000 mg | ORAL_TABLET | Freq: Every day | ORAL | 1 refills | Status: DC

## 2022-05-13 NOTE — Assessment & Plan Note (Signed)
Patient reports continued pain which is well controlled with 1 norco daily. Will increase her quantity to 30 tablets per month. 3 month supply written for patient today, RTC every 3 months for refills.

## 2022-05-13 NOTE — Assessment & Plan Note (Signed)
Patient is reporting weight gain during this visit. We had a discussion about switching her from glipizide to jardiance 10 mg daily to help preserve kidney function and to help with lowering blood sugars without increasing the risk of weight gain. Patient is agreeable to start jardiance 10 mg daily. I will stop her glipizide and she will follow up in 3 months for repeat A1C and medication refills. Kidney function was also reviewed from previous labs, appears to be trending up. Will order new BMP today to surveillance and she is due for urine microalbumin test also.

## 2022-05-13 NOTE — Progress Notes (Signed)
Established Patient Office Visit  Subjective   Patient ID: Doris Jackson, female    DOB: 30-Aug-1940  Age: 81 y.o. MRN: [id number]  Chief Complaint  Patient presents with   Medication Refill    Patient requests a refill on Hydrocodone    Patient is here for medication refills. States that she is breaking the norco's  in half and sometimes she just takes tylenol only for her pain depending on how severe her pain is. States that she still has some uncontrolled pain during the month. Would like to have one tablet per day.  DM-- patient reports she is gaining weight. States that her blood sugars are "low" in the morning, patient states that low means around 100, I explained to the patient that a fasting sugar of 100 is actually not a low value. We discussed that her kidney function is slowly increasing, went from 1.39 to 1.55 in may 2023. We discussed that we could try switching her to an SGLT-2 to help preserve her kidney function.  She reports that the swelling in her legs is stable, continues to wear compression stockings daily.    Patient Active Problem List   Diagnosis Date Noted   Leiomyoma 07/18/2020   Anxiety state    Acute blood loss anemia    Postoperative pain    Cervical myelopathy (HCC) 09/22/2019   S/P cervical spinal fusion 09/22/2019   AKI (acute kidney injury) ([place]) 09/14/2019   GERD (gastroesophageal reflux disease) 09/14/2019   Spinal stenosis in cervical region 09/14/2019   Weakness 09/14/2019   Numbness and tingling 09/14/2019   Cervical spinal stenosis 09/14/2019   Status post cataract extraction and insertion of intraocular lens of left eye 09/04/2019   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/24/2019   Persistent cough 06/06/2019   History of Descemet membrane endothelial keratoplasty (DMEK) 01/04/2019   Status post cataract extraction and insertion of intraocular lens of right eye 01/04/2019   Senile nuclear sclerosis 01/02/2019   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   Anatomical narrow angle, 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   Obesity, morbid, BMI 40.0-49.9 ([place]) 04/19/2018   Morbid obesity ([place]) 04/19/2018   Cataract    Fibroid    Endometriosis    DM (diabetes mellitus) type II controlled with renal manifestation ([place]) 06/17/2010   Dyslipidemia 06/17/2010   Microcytic anemia 06/17/2010   Essential hypertension 06/17/2010   Allergic rhinitis 06/17/2010   Osteoarthritis 06/17/2010   LOW BACK PAIN 06/17/2010   OSTEOPENIA 06/17/2010   BREAST CANCER, 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     Objective:     BP 138/80 (BP Location: Left Arm, Patient Position: Sitting, Cuff Size: Large)   Pulse 75   Temp 97.6 F (36.4 C) (Oral)   Ht '5\' 2"'$  (1.575 m)   Wt 241 lb 6.4 oz (109.5 kg)   SpO2 97%   BMI 44.15 kg/m    Physical Exam Vitals reviewed.  Constitutional:      Appearance: Normal appearance. She is well-groomed. She is morbidly obese.  Eyes:     Conjunctiva/sclera: Conjunctivae normal.  Neck:     Thyroid: No thyromegaly.  Cardiovascular:     Rate and Rhythm: Normal rate and regular rhythm.     Pulses: Normal pulses.     Heart sounds: S1 normal and S2 normal.  Pulmonary:     Effort: Pulmonary effort is normal.  Breath sounds: Normal breath sounds and air entry.  Abdominal:     General: Bowel sounds are normal.  Musculoskeletal:     Right lower leg: Edema (2+ BL ankles to the mid calf) present.     Left lower leg: Edema present.  Neurological:     Mental Status: She is alert and oriented to person, place, and time. Mental status is at baseline.     Gait: Gait is intact.  Psychiatric:        Mood and Affect: Mood and affect normal.        Speech: Speech normal.        Behavior: Behavior normal.        Judgment: Judgment normal.      No results found for any visits on 05/13/22.  Last metabolic panel Lab Results  Component  Value Date   GLUCOSE 138 (H) 10/28/2021   NA 137 10/28/2021   K 4.0 10/28/2021   CL 102 10/28/2021   CO2 28 10/28/2021   BUN 41 (H) 10/28/2021   CREATININE 1.55 (H) 10/28/2021   GFRNONAA 45 (L) 09/24/2020   CALCIUM 9.2 10/28/2021   PROT 7.9 10/28/2021   ALBUMIN 3.9 10/28/2021   BILITOT 0.5 10/28/2021   ALKPHOS 105 10/28/2021   AST 12 10/28/2021   ALT 9 10/28/2021   ANIONGAP 7 09/24/2020      The ASCVD Risk score (Arnett DK, et al., 2019) failed to calculate for the following reasons:   The 2019 ASCVD risk score is only valid for ages 55 to 50    Assessment & Plan:   Problem List Items Addressed This Visit       Cardiovascular and Mediastinum   Essential hypertension   Relevant Medications   hydrochlorothiazide (HYDRODIURIL) 12.5 MG tablet     Endocrine   DM (diabetes mellitus) type II controlled with renal manifestation ([place]) - Primary    Patient is reporting weight gain during this visit. We had a discussion about switching her from glipizide to jardiance 10 mg daily to help preserve kidney function and to help with lowering blood sugars without increasing the risk of weight gain. Patient is agreeable to start jardiance 10 mg daily. I will stop her glipizide and she will follow up in 3 months for repeat A1C and medication refills. Kidney function was also reviewed from previous labs, appears to be trending up. Will order new BMP today to surveillance and she is due for urine microalbumin test also.       Relevant Medications   empagliflozin (JARDIANCE) 10 MG TABS tablet   Other Relevant Orders   Basic Metabolic Panel   Microalbumin/Creatinine Ratio, Urine     Other   Degenerative lumbar spinal stenosis    Patient reports continued pain which is well controlled with 1 norco daily. Will increase her quantity to 30 tablets per month. 3 month supply written for patient today, RTC every 3 months for refills.       Relevant Medications   HYDROcodone-acetaminophen (NORCO)  5-325 MG tablet (Start on 07/12/2022)   HYDROcodone-acetaminophen (NORCO) 5-325 MG tablet (Start on 06/12/2022)   HYDROcodone-acetaminophen (NORCO/VICODIN) 5-325 MG tablet    Return in about 3 months (around 08/13/2022) for A1C recheck and medication refills.    Farrel Conners, MD

## 2022-05-13 NOTE — Patient Instructions (Signed)
STOP glipizide  START jardiance 10 mg daily in the morning

## 2022-05-14 LAB — HM MAMMOGRAPHY

## 2022-05-14 NOTE — Progress Notes (Signed)
Kidney function is actually better than her previous labs, continue all medications as prescribed

## 2022-06-04 ENCOUNTER — Telehealth: Payer: Self-pay | Admitting: Family Medicine

## 2022-06-04 NOTE — Telephone Encounter (Addendum)
Pt called to say she does not want to continue taking the empagliflozin (JARDIANCE) 10 MG TABS tablet   Pt states this is not working for her and she wants to resume the medication she was taking previously.  [facility] [address] [place] Phone: [phone]  Fax: [phone]     LOV:  05/13/22

## 2022-06-05 NOTE — Telephone Encounter (Signed)
Patient informed of the message below, agreed to restart Glipizide and this was added to the medication list.

## 2022-06-05 NOTE — Telephone Encounter (Signed)
I would not have her stop the jardiance as it will help preserve her kidney function, but she may go ahead and restart the glipizide. If she is agreeable please place the glipizide back on her med list. Thanks!

## 2022-06-11 ENCOUNTER — Other Ambulatory Visit: Payer: Self-pay | Admitting: Family Medicine

## 2022-06-11 ENCOUNTER — Other Ambulatory Visit: Payer: Self-pay | Admitting: *Deleted

## 2022-06-11 MED ORDER — ONETOUCH DELICA LANCETS 33G MISC: 1 refills | Status: AC

## 2022-06-11 NOTE — Telephone Encounter (Signed)
Rx done. 

## 2022-07-02 ENCOUNTER — Encounter: Payer: Self-pay | Admitting: Family Medicine

## 2022-07-14 DIAGNOSIS — J3081 Allergic rhinitis due to animal (cat) (dog) hair and dander: Secondary | ICD-10-CM | POA: Diagnosis not present

## 2022-07-14 DIAGNOSIS — J3089 Other allergic rhinitis: Secondary | ICD-10-CM | POA: Diagnosis not present

## 2022-07-14 DIAGNOSIS — J301 Allergic rhinitis due to pollen: Secondary | ICD-10-CM | POA: Diagnosis not present

## 2022-07-19 ENCOUNTER — Other Ambulatory Visit: Payer: Self-pay | Admitting: Family Medicine

## 2022-08-05 DIAGNOSIS — J3089 Other allergic rhinitis: Secondary | ICD-10-CM | POA: Diagnosis not present

## 2022-08-05 DIAGNOSIS — J301 Allergic rhinitis due to pollen: Secondary | ICD-10-CM | POA: Diagnosis not present

## 2022-08-14 ENCOUNTER — Encounter: Payer: Self-pay | Admitting: Family Medicine

## 2022-08-14 ENCOUNTER — Ambulatory Visit (INDEPENDENT_AMBULATORY_CARE_PROVIDER_SITE_OTHER): Payer: Medicare HMO | Admitting: Family Medicine

## 2022-08-14 VITALS — BP 138/70 | HR 75 | Temp 98.0°F | Ht 62.0 in | Wt 233.8 lb

## 2022-08-14 DIAGNOSIS — N183 Chronic kidney disease, stage 3 unspecified: Secondary | ICD-10-CM | POA: Diagnosis not present

## 2022-08-14 DIAGNOSIS — I10 Essential (primary) hypertension: Secondary | ICD-10-CM | POA: Diagnosis not present

## 2022-08-14 DIAGNOSIS — M48061 Spinal stenosis, lumbar region without neurogenic claudication: Secondary | ICD-10-CM | POA: Diagnosis not present

## 2022-08-14 DIAGNOSIS — E1122 Type 2 diabetes mellitus with diabetic chronic kidney disease: Secondary | ICD-10-CM | POA: Diagnosis not present

## 2022-08-14 DIAGNOSIS — E1129 Type 2 diabetes mellitus with other diabetic kidney complication: Secondary | ICD-10-CM

## 2022-08-14 LAB — POCT GLYCOSYLATED HEMOGLOBIN (HGB A1C): Hemoglobin A1C: 7.1 % — AB (ref 4.0–5.6)

## 2022-08-14 MED ORDER — METFORMIN HCL ER 500 MG PO TB24: 500.0000 mg | ORAL_TABLET | Freq: Two times a day (BID) | ORAL | 1 refills | Status: DC

## 2022-08-14 MED ORDER — METOPROLOL SUCCINATE ER 50 MG PO TB24: 50.0000 mg | ORAL_TABLET | Freq: Every day | ORAL | 1 refills | Status: DC

## 2022-08-14 NOTE — Assessment & Plan Note (Signed)
Current hypertension medications:       Sig   benazepril (LOTENSIN) 40 MG tablet (Taking) Take 1 tablet by mouth once daily   furosemide (LASIX) 20 MG tablet (Taking) Take 1 tablet (20 mg total) by mouth daily as needed.   hydrochlorothiazide (HYDRODIURIL) 12.5 MG tablet (Taking) Take 1 tablet (12.5 mg total) by mouth daily.   metoprolol succinate (TOPROL-XL) 50 MG 24 hr tablet Take 1 tablet (50 mg total) by mouth daily. Take with or immediately following a meal.      BP is well controlled today on her medications, will continue as prescribed above.

## 2022-08-14 NOTE — Assessment & Plan Note (Signed)
A1C today is 7.1, relatively controlled, will continue the glipizide 5 mg daily and metformin 500 mg BID. RTC in 3 months for BMP, foot exam.

## 2022-08-14 NOTE — Assessment & Plan Note (Addendum)
Patient only had 1 norco rx filled since the last visit. I encouraged her to call the pharmacy when she needs this refilled.  Continue norco 5/325 mg once daily as needed for pain.

## 2022-08-14 NOTE — Progress Notes (Signed)
Established Patient Office Visit  Subjective   Patient ID: Doris Jackson, female    DOB: 11/27/1940  Age: 82 y.o. MRN: [id number]  Chief Complaint  Patient presents with   Medical Management of Chronic Issues    Pt is here for follow up and medication refills.   DM-- pt reports that the jardiance did not work for her and so she went back to the glipizide. Pt states her blood sugars were "too low" in the morning again, states that she sometimes will only take 1/2 tablet at night. States that she checks her sugars sometimes 3-4 times a day, usually in the morning and night. Reports no changes in balance or other DM symptoms.   HTN -- BP in office performed and is well controlled. She  reports no side effects to the medications, no chest pain, SOB, dizziness or headaches. She has a BP cuff at home and is checking BP regularly, reports they are in the normal range.   Chronic lumbar back pain-- pt reports she is walking well with her walker, can go short distances but not long distances like the grocery store. Reports she is using the norco every other day as needed, still has 2 refills left at the pharmacy. PDMP reviewed.      Current Outpatient Medications  Medication Instructions   acetaminophen (TYLENOL) 500 mg, Oral, Every 6 hours PRN   benazepril (LOTENSIN) 40 mg, Oral, Daily   benzonatate (TESSALON) 200 mg, Oral, 3 times daily PRN   Blood Glucose Monitoring Suppl (ONETOUCH VERIO REFLECT) w/Device KIT USE UP TO FOUR TIMES DAILY AS DIRECTED.   cyanocobalamin 2,000 mcg, Oral, Daily,     diclofenac Sodium (VOLTAREN) 2 g, Topical, 4 times daily   fexofenadine (ALLEGRA) 180 mg, Oral, Daily PRN   fluticasone (FLONASE) 50 MCG/ACT nasal spray 1 spray, Each Nare, Daily   furosemide (LASIX) 20 mg, Oral, Daily PRN   glipiZIDE (GLUCOTROL) 5 mg, Oral, Daily before breakfast   glucose blood (ONETOUCH VERIO) test strip USE STRIPS TO CHECK GLUCOSE 2 TO 4 TIMES DAILY AS NEEDED FOR GLUCOSE CONTROL    hydrochlorothiazide (HYDRODIURIL) 12.5 mg, Oral, Daily   HYDROcodone-acetaminophen (NORCO) 5-325 MG tablet 1 tablet, Oral, Daily PRN   HYDROcodone-acetaminophen (NORCO) 5-325 MG tablet 1 tablet, Oral, Daily PRN   HYDROcodone-acetaminophen (NORCO/VICODIN) 5-325 MG tablet 1 tablet, Oral, Daily PRN   iron polysaccharides (FERREX 150) 150 mg, Oral, Daily   Lancets 30G MISC USE TWICE DAILY AS DIRECTED FOR GLUCOSE TESTING AND MONITORING   Lancets MISC Use as directed   metFORMIN (GLUCOPHAGE-XR) 500 mg, Oral, 2 times daily   metoprolol succinate (TOPROL-XL) 50 mg, Oral, Daily, Take with or immediately following a meal.   ONE TOUCH LANCETS MISC Test twice daily.   OneTouch Delica Lancets 99991111 MISC Use as directed once a day   pioglitazone (ACTOS) 45 MG tablet TAKE 1 TABLET BY MOUTH ONCE DAILY . APPOINTMENT REQUIRED FOR FUTURE REFILLS   pravastatin (PRAVACHOL) 40 mg, Oral, Daily   prednisoLONE acetate (PRED FORTE) 1 % ophthalmic suspension 1 drop, Both Eyes, See admin instructions, Place 1 drop into left eye three times daily Place 1 drop into right eye once daily   Probiotic Product (PROBIOTIC-10 PO) 1 capsule, Oral, Daily   senna-docusate (SENOKOT-S) 8.6-50 MG tablet 1 tablet, Oral, Daily at bedtime   vitamin C 1,000 mg, Oral, Daily   Vitamin D 4,000 Units, Oral, Daily      Review of Systems  All other systems reviewed  and are negative.     Objective:     BP 138/70 (BP Location: Left Arm, Patient Position: Sitting, Cuff Size: Large)   Pulse 75   Temp 98 F (36.7 C) (Oral)   Ht 5' 2"$  (1.575 m)   Wt 233 lb 12.8 oz (106.1 kg)   SpO2 99%   BMI 42.76 kg/m    Physical Exam Vitals reviewed.  Constitutional:      Appearance: Normal appearance. She is well-groomed. She is morbidly obese.  Eyes:     Conjunctiva/sclera: Conjunctivae normal.  Neck:     Thyroid: No thyromegaly.  Cardiovascular:     Rate and Rhythm: Normal rate and regular rhythm.     Pulses: Normal pulses.     Heart  sounds: S1 normal and S2 normal.  Pulmonary:     Effort: Pulmonary effort is normal.     Breath sounds: Normal breath sounds and air entry.  Abdominal:     General: Bowel sounds are normal.  Musculoskeletal:     Right lower leg: Edema (1+ BL ankles to the mid calf) present.     Left lower leg: Edema present.  Neurological:     Mental Status: She is alert and oriented to person, place, and time. Mental status is at baseline.     Gait: Gait is intact.  Psychiatric:        Mood and Affect: Mood and affect normal.        Speech: Speech normal.        Behavior: Behavior normal.        Judgment: Judgment normal.      Results for orders placed or performed in visit on 08/14/22  POC HgB A1c  Result Value Ref Range   Hemoglobin A1C 7.1 (A) 4.0 - 5.6 %   HbA1c POC (<> result, manual entry)     HbA1c, POC (prediabetic range)     HbA1c, POC (controlled diabetic range)        The ASCVD Risk score (Arnett DK, et al., 2019) failed to calculate for the following reasons:   The 2019 ASCVD risk score is only valid for ages 60 to 61    Assessment & Plan:   Problem List Items Addressed This Visit       Unprioritized   DM (diabetes mellitus) type II controlled with renal manifestation ([place]) - Primary    A1C today is 7.1, relatively controlled, will continue the glipizide 5 mg daily and metformin 500 mg BID. RTC in 3 months for BMP, foot exam.       Relevant Medications   metFORMIN (GLUCOPHAGE-XR) 500 MG 24 hr tablet   Other Relevant Orders   POC HgB A1c (Completed)   Essential hypertension    Current hypertension medications:       Sig   benazepril (LOTENSIN) 40 MG tablet (Taking) Take 1 tablet by mouth once daily   furosemide (LASIX) 20 MG tablet (Taking) Take 1 tablet (20 mg total) by mouth daily as needed.   hydrochlorothiazide (HYDRODIURIL) 12.5 MG tablet (Taking) Take 1 tablet (12.5 mg total) by mouth daily.   metoprolol succinate (TOPROL-XL) 50 MG 24 hr tablet Take 1 tablet  (50 mg total) by mouth daily. Take with or immediately following a meal.     BP is well controlled today on her medications, will continue as prescribed above.      Relevant Medications   metoprolol succinate (TOPROL-XL) 50 MG 24 hr tablet   Degenerative lumbar spinal stenosis  Patient only had 1 norco rx filled since the last visit. I encouraged her to call the pharmacy when she needs this refilled.  Continue norco 5/325 mg once daily as needed for pain.        Return in about 3 months (around 11/12/2022) for HTN, DM, medications refills.    Farrel Conners, MD

## 2022-08-26 ENCOUNTER — Other Ambulatory Visit: Payer: Self-pay | Admitting: *Deleted

## 2022-08-26 DIAGNOSIS — R600 Localized edema: Secondary | ICD-10-CM

## 2022-08-26 DIAGNOSIS — J301 Allergic rhinitis due to pollen: Secondary | ICD-10-CM | POA: Diagnosis not present

## 2022-08-26 DIAGNOSIS — J3089 Other allergic rhinitis: Secondary | ICD-10-CM | POA: Diagnosis not present

## 2022-08-26 MED ORDER — FUROSEMIDE 20 MG PO TABS: 20.0000 mg | ORAL_TABLET | Freq: Every day | ORAL | 1 refills | Status: DC | PRN

## 2022-08-29 ENCOUNTER — Other Ambulatory Visit: Payer: Self-pay

## 2022-08-29 NOTE — Patient Outreach (Signed)
Aging Gracefully Program  08/29/2022  Doris Jackson [birth year] [id number]    [facility] Evaluation Interviewer made contact with patient. Aging Gracefully 9 month survey completed.    [place] Management Assistant [phone]

## 2022-09-01 ENCOUNTER — Telehealth: Payer: Self-pay | Admitting: *Deleted

## 2022-09-01 DIAGNOSIS — E1129 Type 2 diabetes mellitus with other diabetic kidney complication: Secondary | ICD-10-CM

## 2022-09-01 NOTE — Telephone Encounter (Signed)
[facility] faxed a refill request for Glipizide '5mg'$ -take 1 daily before breakfast.  Message sent to PCP as Rx listed as historical med.

## 2022-09-02 DIAGNOSIS — J301 Allergic rhinitis due to pollen: Secondary | ICD-10-CM | POA: Diagnosis not present

## 2022-09-02 DIAGNOSIS — J3089 Other allergic rhinitis: Secondary | ICD-10-CM | POA: Diagnosis not present

## 2022-09-02 MED ORDER — GLIPIZIDE 5 MG PO TABS: 5.0000 mg | ORAL_TABLET | Freq: Every day | ORAL | 3 refills | Status: DC

## 2022-09-08 DIAGNOSIS — K5900 Constipation, unspecified: Secondary | ICD-10-CM | POA: Diagnosis not present

## 2022-09-08 DIAGNOSIS — R69 Illness, unspecified: Secondary | ICD-10-CM | POA: Diagnosis not present

## 2022-09-08 DIAGNOSIS — R32 Unspecified urinary incontinence: Secondary | ICD-10-CM | POA: Diagnosis not present

## 2022-09-08 DIAGNOSIS — R600 Localized edema: Secondary | ICD-10-CM | POA: Diagnosis not present

## 2022-09-08 DIAGNOSIS — M1990 Unspecified osteoarthritis, unspecified site: Secondary | ICD-10-CM | POA: Diagnosis not present

## 2022-09-08 DIAGNOSIS — Z79891 Long term (current) use of opiate analgesic: Secondary | ICD-10-CM | POA: Diagnosis not present

## 2022-09-08 DIAGNOSIS — K219 Gastro-esophageal reflux disease without esophagitis: Secondary | ICD-10-CM | POA: Diagnosis not present

## 2022-09-08 DIAGNOSIS — E785 Hyperlipidemia, unspecified: Secondary | ICD-10-CM | POA: Diagnosis not present

## 2022-09-08 DIAGNOSIS — N189 Chronic kidney disease, unspecified: Secondary | ICD-10-CM | POA: Diagnosis not present

## 2022-09-08 DIAGNOSIS — R269 Unspecified abnormalities of gait and mobility: Secondary | ICD-10-CM | POA: Diagnosis not present

## 2022-09-08 DIAGNOSIS — I499 Cardiac arrhythmia, unspecified: Secondary | ICD-10-CM | POA: Diagnosis not present

## 2022-09-08 DIAGNOSIS — I2510 Atherosclerotic heart disease of native coronary artery without angina pectoris: Secondary | ICD-10-CM | POA: Diagnosis not present

## 2022-09-08 DIAGNOSIS — Z008 Encounter for other general examination: Secondary | ICD-10-CM | POA: Diagnosis not present

## 2022-09-16 DIAGNOSIS — J3089 Other allergic rhinitis: Secondary | ICD-10-CM | POA: Diagnosis not present

## 2022-09-16 DIAGNOSIS — J3081 Allergic rhinitis due to animal (cat) (dog) hair and dander: Secondary | ICD-10-CM | POA: Diagnosis not present

## 2022-09-16 DIAGNOSIS — J301 Allergic rhinitis due to pollen: Secondary | ICD-10-CM | POA: Diagnosis not present

## 2022-09-24 ENCOUNTER — Telehealth: Payer: Self-pay | Admitting: Family Medicine

## 2022-09-24 NOTE — Telephone Encounter (Signed)
Pt need another referral to see GI for colonoscopy. Pt did not go in 2021

## 2022-09-29 NOTE — Telephone Encounter (Signed)
Previous referral was entered for colon cancer screening.  Spoke with the patient and informed her of the information below per PCP.  Patient stated a nurse came to her home, told her the kidney function was worse and she wanted to know what would be done about this as she has an upcoming appt on 4/16.  I advised the patient to bring any paperwork the nurse provided with this information for Dr Legrand Como to review during the visit and she agreed.

## 2022-09-29 NOTE — Telephone Encounter (Signed)
What is the reason for the consult? I need the diagnosis-- she is >75 so she doesn't need screening colonoscopies anymore.Marland KitchenMarland Kitchen

## 2022-10-07 DIAGNOSIS — J3081 Allergic rhinitis due to animal (cat) (dog) hair and dander: Secondary | ICD-10-CM | POA: Diagnosis not present

## 2022-10-07 DIAGNOSIS — J301 Allergic rhinitis due to pollen: Secondary | ICD-10-CM | POA: Diagnosis not present

## 2022-10-07 DIAGNOSIS — J3089 Other allergic rhinitis: Secondary | ICD-10-CM | POA: Diagnosis not present

## 2022-10-14 ENCOUNTER — Encounter: Payer: Self-pay | Admitting: Family Medicine

## 2022-10-14 ENCOUNTER — Ambulatory Visit (INDEPENDENT_AMBULATORY_CARE_PROVIDER_SITE_OTHER): Payer: Medicare HMO | Admitting: Family Medicine

## 2022-10-14 VITALS — BP 136/88 | HR 75 | Temp 97.6°F | Ht 62.0 in | Wt 234.2 lb

## 2022-10-14 DIAGNOSIS — N1832 Chronic kidney disease, stage 3b: Secondary | ICD-10-CM | POA: Diagnosis not present

## 2022-10-14 NOTE — Assessment & Plan Note (Signed)
We reviewed her previous BUN/Cr results and I reassured the patient that her kidney function has remained stable over the past 3 years. I explained why she is having this issue and what we do if it gets worse.

## 2022-10-14 NOTE — Progress Notes (Signed)
Established Patient Office Visit  Subjective   Patient ID: Doris Jackson, female    DOB: 04-Mar-1941  Age: 82 y.o. MRN: [id number]  Chief Complaint  Patient presents with   Results    Patient presents to discuss test results regarding her kidney from a nurse with [employer] (see attached) that came to her home    Patient is here to discuss recent blood work that was done by a nurse that came to her house. States that she was told her kidney function was "off" and she wanted more information about it. We reviewed her last    Current Outpatient Medications  Medication Instructions   acetaminophen (TYLENOL) 500 mg, Oral, Every 6 hours PRN   benazepril (LOTENSIN) 40 mg, Oral, Daily   benzonatate (TESSALON) 200 mg, Oral, 3 times daily PRN   Blood Glucose Monitoring Suppl (ONETOUCH VERIO REFLECT) w/Device KIT USE UP TO FOUR TIMES DAILY AS DIRECTED.   cyanocobalamin 2,000 mcg, Oral, Daily,     diclofenac Sodium (VOLTAREN) 2 g, Topical, 4 times daily   fexofenadine (ALLEGRA) 180 mg, Oral, Daily PRN   fluticasone (FLONASE) 50 MCG/ACT nasal spray 1 spray, Each Nare, Daily   furosemide (LASIX) 20 mg, Oral, Daily PRN   glipiZIDE (GLUCOTROL) 5 mg, Oral, Daily before breakfast   glucose blood (ONETOUCH VERIO) test strip USE STRIPS TO CHECK GLUCOSE 2 TO 4 TIMES DAILY AS NEEDED FOR GLUCOSE CONTROL   hydrochlorothiazide (HYDRODIURIL) 12.5 mg, Oral, Daily   HYDROcodone-acetaminophen (NORCO) 5-325 MG tablet 1 tablet, Oral, Daily PRN   HYDROcodone-acetaminophen (NORCO) 5-325 MG tablet 1 tablet, Oral, Daily PRN   HYDROcodone-acetaminophen (NORCO/VICODIN) 5-325 MG tablet 1 tablet, Oral, Daily PRN   iron polysaccharides (FERREX 150) 150 mg, Oral, Daily   Lancets 30G MISC USE TWICE DAILY AS DIRECTED FOR GLUCOSE TESTING AND MONITORING   Lancets MISC Use as directed   metFORMIN (GLUCOPHAGE-XR) 500 mg, Oral, 2 times daily   metoprolol succinate (TOPROL-XL) 50 mg, Oral, Daily, Take with or immediately following a  meal.   ONE TOUCH LANCETS MISC Test twice daily.   OneTouch Delica Lancets 33G MISC Use as directed once a day   pioglitazone (ACTOS) 45 MG tablet TAKE 1 TABLET BY MOUTH ONCE DAILY . APPOINTMENT REQUIRED FOR FUTURE REFILLS   pravastatin (PRAVACHOL) 40 mg, Oral, Daily   prednisoLONE acetate (PRED FORTE) 1 % ophthalmic suspension 1 drop, Both Eyes, See admin instructions, Place 1 drop into left eye three times daily Place 1 drop into right eye once daily   Probiotic Product (PROBIOTIC-10 PO) 1 capsule, Oral, Daily   senna-docusate (SENOKOT-S) 8.6-50 MG tablet 1 tablet, Oral, Daily at bedtime   vitamin C 1,000 mg, Oral, Daily   Vitamin D 4,000 Units, Oral, Daily     Patient Active Problem List   Diagnosis Date Noted   Stage 3b chronic kidney disease 10/14/2022   Leiomyoma 07/18/2020   Anxiety state    Acute blood loss anemia    Postoperative pain    Cervical myelopathy 09/22/2019   S/P cervical spinal fusion 09/22/2019   AKI (acute kidney injury) 09/14/2019   GERD (gastroesophageal reflux disease) 09/14/2019   Spinal stenosis in cervical region 09/14/2019   Weakness 09/14/2019   Numbness and tingling 09/14/2019   Cervical spinal stenosis 09/14/2019   Status post cataract extraction and insertion of intraocular lens of left eye 09/04/2019   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/24/2019   Persistent cough 06/06/2019   History of Descemet membrane endothelial keratoplasty (DMEK) 01/04/2019  Status post cataract extraction and insertion of intraocular lens of right eye 01/04/2019   Senile nuclear sclerosis 01/02/2019   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   Anatomical narrow angle, 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   Obesity, morbid, BMI 40.0-49.9 04/19/2018   Morbid obesity 04/19/2018   Cataract    Fibroid    Endometriosis    DM (diabetes mellitus) type II controlled with renal manifestation 06/17/2010   Dyslipidemia 06/17/2010   Microcytic anemia 06/17/2010   Essential hypertension  06/17/2010   Allergic rhinitis 06/17/2010   Osteoarthritis 06/17/2010   LOW BACK PAIN 06/17/2010   OSTEOPENIA 06/17/2010   BREAST CANCER, 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     Objective:     BP 136/88 (BP Location: Left Arm, Patient Position: Sitting, Cuff Size: Large)   Pulse 75   Temp 97.6 F (36.4 C) (Oral)   Ht  (1.575 m)   Wt 234 lb 3.2 oz (106.2 kg)   SpO2 98%   BMI 42.84 kg/m    Physical Exam Vitals reviewed.  Constitutional:      Appearance: Normal appearance. She is morbidly obese.  Eyes:     Conjunctiva/sclera: Conjunctivae normal.  Pulmonary:     Effort: Pulmonary effort is normal.  Musculoskeletal:     Right lower leg: Edema (1+ BLE, at baseline for patient) present.     Left lower leg: Edema present.  Neurological:     Mental Status: She is alert and oriented to person, place, and time. Mental status is at baseline.  Psychiatric:        Mood and Affect: Mood normal.        Behavior: Behavior normal.        Thought Content: Thought content normal.      No results found for any visits on 10/14/22.    The ASCVD Risk score (Arnett DK, et al., 2019) failed to calculate for the following reasons:   The 2019 ASCVD risk score is only valid for ages 82 to 17    Assessment & Plan:   Problem List Items Addressed This Visit       Unprioritized   Stage 3b chronic kidney disease - Primary    We reviewed her previous BUN/Cr results and I reassured the patient that her kidney function has remained stable over the past 3 years. I explained why she is having this issue and what we do if it gets worse.      I spent 15 minutes today with patient going over her labs and discussing what it means.   Return for See me in 1 month for follow up and blood work.    Karie Georges, MD

## 2022-10-27 DIAGNOSIS — J3081 Allergic rhinitis due to animal (cat) (dog) hair and dander: Secondary | ICD-10-CM | POA: Diagnosis not present

## 2022-10-27 DIAGNOSIS — J301 Allergic rhinitis due to pollen: Secondary | ICD-10-CM | POA: Diagnosis not present

## 2022-10-27 DIAGNOSIS — J3089 Other allergic rhinitis: Secondary | ICD-10-CM | POA: Diagnosis not present

## 2022-11-13 ENCOUNTER — Ambulatory Visit (INDEPENDENT_AMBULATORY_CARE_PROVIDER_SITE_OTHER): Payer: Medicare HMO | Admitting: Family Medicine

## 2022-11-13 VITALS — BP 150/86 | HR 90 | Temp 97.7°F | Ht 62.0 in | Wt 231.8 lb

## 2022-11-13 DIAGNOSIS — E1122 Type 2 diabetes mellitus with diabetic chronic kidney disease: Secondary | ICD-10-CM | POA: Diagnosis not present

## 2022-11-13 DIAGNOSIS — D509 Iron deficiency anemia, unspecified: Secondary | ICD-10-CM | POA: Diagnosis not present

## 2022-11-13 DIAGNOSIS — Z7984 Long term (current) use of oral hypoglycemic drugs: Secondary | ICD-10-CM

## 2022-11-13 DIAGNOSIS — N1832 Chronic kidney disease, stage 3b: Secondary | ICD-10-CM

## 2022-11-13 DIAGNOSIS — I10 Essential (primary) hypertension: Secondary | ICD-10-CM | POA: Diagnosis not present

## 2022-11-13 DIAGNOSIS — M159 Polyosteoarthritis, unspecified: Secondary | ICD-10-CM | POA: Diagnosis not present

## 2022-11-13 DIAGNOSIS — E1129 Type 2 diabetes mellitus with other diabetic kidney complication: Secondary | ICD-10-CM

## 2022-11-13 LAB — CBC WITH DIFFERENTIAL/PLATELET
Basophils Absolute: 0 10*3/uL (ref 0.0–0.1)
Basophils Relative: 0.5 % (ref 0.0–3.0)
Eosinophils Absolute: 0.1 10*3/uL (ref 0.0–0.7)
Eosinophils Relative: 1.8 % (ref 0.0–5.0)
HCT: 33.7 % — ABNORMAL LOW (ref 36.0–46.0)
Hemoglobin: 11.1 g/dL — ABNORMAL LOW (ref 12.0–15.0)
Lymphocytes Relative: 30.9 % (ref 12.0–46.0)
Lymphs Abs: 1.9 10*3/uL (ref 0.7–4.0)
MCHC: 33 g/dL (ref 30.0–36.0)
MCV: 81 fl (ref 78.0–100.0)
Monocytes Absolute: 0.7 10*3/uL (ref 0.1–1.0)
Monocytes Relative: 10.7 % (ref 3.0–12.0)
Neutro Abs: 3.5 10*3/uL (ref 1.4–7.7)
Neutrophils Relative %: 56.1 % (ref 43.0–77.0)
Platelets: 146 10*3/uL — ABNORMAL LOW (ref 150.0–400.0)
RBC: 4.16 Mil/uL (ref 3.87–5.11)
RDW: 17.1 % — ABNORMAL HIGH (ref 11.5–15.5)
WBC: 6.3 10*3/uL (ref 4.0–10.5)

## 2022-11-13 LAB — COMPREHENSIVE METABOLIC PANEL
ALT: 11 U/L (ref 0–35)
AST: 14 U/L (ref 0–37)
Albumin: 4 g/dL (ref 3.5–5.2)
Alkaline Phosphatase: 114 U/L (ref 39–117)
BUN: 18 mg/dL (ref 6–23)
CO2: 25 mEq/L (ref 19–32)
Calcium: 9.5 mg/dL (ref 8.4–10.5)
Chloride: 103 mEq/L (ref 96–112)
Creatinine, Ser: 1.07 mg/dL (ref 0.40–1.20)
GFR: 48.96 mL/min — ABNORMAL LOW (ref >60.00)
Glucose, Bld: 110 mg/dL — ABNORMAL HIGH (ref 70–99)
Potassium: 3.4 mEq/L — ABNORMAL LOW (ref 3.5–5.1)
Sodium: 140 mEq/L (ref 135–145)
Total Bilirubin: 0.6 mg/dL (ref 0.2–1.2)
Total Protein: 8.2 g/dL (ref 6.0–8.3)

## 2022-11-13 LAB — POCT GLYCOSYLATED HEMOGLOBIN (HGB A1C): Hemoglobin A1C: 6.6 % — AB (ref 4.0–5.6)

## 2022-11-13 LAB — LIPID PANEL
Cholesterol: 162 mg/dL (ref 0–200)
HDL: 49.3 mg/dL (ref >39.00)
LDL Cholesterol: 89 mg/dL (ref 0–99)
NonHDL: 112.49
Total CHOL/HDL Ratio: 3
Triglycerides: 118 mg/dL (ref 0.0–149.0)
VLDL: 23.6 mg/dL (ref 0.0–40.0)

## 2022-11-13 MED ORDER — DICLOFENAC SODIUM 1 % EX GEL: 2.0000 g | Freq: Four times a day (QID) | CUTANEOUS | 5 refills | Status: DC

## 2022-11-13 MED ORDER — HYDROCHLOROTHIAZIDE 12.5 MG PO TABS: 12.5000 mg | ORAL_TABLET | Freq: Every day | ORAL | 1 refills | Status: DC

## 2022-11-13 NOTE — Progress Notes (Signed)
Established Patient Office Visit  Subjective   Patient ID: Doris Jackson, female    DOB: August 10, 1940  Age: 82 y.o. MRN: [id number]  Chief Complaint  Patient presents with   Medical Management of Chronic Issues    Pt is here for follow up today,   DM-- pt reports she is watching her diet, A1C performed today and is 6.6. foot exam performed today and is WNL. Pt was reminded that she needs her eye exam done this year. She reports no new symptoms or issues today.  Chronic pain-- pt reports continued pain in her left shoulder and her lower back. States that she is only taking the norco 1/2 tablet twice a week, she states that it makes her feel tired/sleepy. We did discuss having her go back to her specialists to talk about getting injections, however pt does not wish to have injections at this time.   HTN-- BP is elevatd today, pt states she did not take her medications this morning due to being fasting for blood work. She reports usually she is in the 130's.     Current Outpatient Medications  Medication Instructions   acetaminophen (TYLENOL) 500 mg, Oral, Every 6 hours PRN   benazepril (LOTENSIN) 40 mg, Oral, Daily   benzonatate (TESSALON) 200 mg, Oral, 3 times daily PRN   Blood Glucose Monitoring Suppl (ONETOUCH VERIO REFLECT) w/Device KIT USE UP TO FOUR TIMES DAILY AS DIRECTED.   cyanocobalamin 2,000 mcg, Oral, Daily,     diclofenac Sodium (VOLTAREN) 2 g, Topical, 4 times daily   fexofenadine (ALLEGRA) 180 mg, Oral, Daily PRN   fluticasone (FLONASE) 50 MCG/ACT nasal spray 1 spray, Each Nare, Daily   furosemide (LASIX) 20 mg, Oral, Daily PRN   glipiZIDE (GLUCOTROL) 5 mg, Oral, Daily before breakfast   glucose blood (ONETOUCH VERIO) test strip USE STRIPS TO CHECK GLUCOSE 2 TO 4 TIMES DAILY AS NEEDED FOR GLUCOSE CONTROL   hydrochlorothiazide (HYDRODIURIL) 12.5 mg, Oral, Daily   HYDROcodone-acetaminophen (NORCO) 5-325 MG tablet 1 tablet, Oral, Daily PRN   HYDROcodone-acetaminophen  (NORCO) 5-325 MG tablet 1 tablet, Oral, Daily PRN   HYDROcodone-acetaminophen (NORCO/VICODIN) 5-325 MG tablet 1 tablet, Oral, Daily PRN   iron polysaccharides (FERREX 150) 150 mg, Oral, Daily   Lancets 30G MISC USE TWICE DAILY AS DIRECTED FOR GLUCOSE TESTING AND MONITORING   Lancets MISC Use as directed   metFORMIN (GLUCOPHAGE-XR) 500 mg, Oral, 2 times daily   metoprolol succinate (TOPROL-XL) 50 mg, Oral, Daily, Take with or immediately following a meal.   ONE TOUCH LANCETS MISC Test twice daily.   OneTouch Delica Lancets 33G MISC Use as directed once a day   pioglitazone (ACTOS) 45 MG tablet TAKE 1 TABLET BY MOUTH ONCE DAILY . APPOINTMENT REQUIRED FOR FUTURE REFILLS   pravastatin (PRAVACHOL) 40 mg, Oral, Daily   prednisoLONE acetate (PRED FORTE) 1 % ophthalmic suspension 1 drop, Both Eyes, See admin instructions, Place 1 drop into left eye three times daily Place 1 drop into right eye once daily   Probiotic Product (PROBIOTIC-10 PO) 1 capsule, Oral, Daily   senna-docusate (SENOKOT-S) 8.6-50 MG tablet 1 tablet, Oral, Daily at bedtime   vitamin C 1,000 mg, Oral, Daily   Vitamin D 4,000 Units, Oral, Daily    Patient Active Problem List   Diagnosis Date Noted   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/2022   Leiomyoma 07/18/2020   Anxiety state    Acute blood loss anemia    Postoperative pain  Cervical myelopathy (HCC) 09/22/2019   S/P cervical spinal fusion 09/22/2019   AKI (acute kidney injury) (HCC) 09/14/2019   GERD (gastroesophageal reflux disease) 09/14/2019   Spinal stenosis in cervical region 09/14/2019   Weakness 09/14/2019   Numbness and tingling 09/14/2019   Cervical spinal stenosis 09/14/2019   Status post cataract extraction and insertion of intraocular lens of left eye 09/04/2019   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/24/2019   Persistent cough 06/06/2019   History of Descemet membrane endothelial keratoplasty (DMEK) 01/04/2019   Status post cataract extraction  and insertion of intraocular lens of right eye 01/04/2019   Senile nuclear sclerosis 01/02/2019   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   Anatomical narrow angle, 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   Obesity, morbid, BMI 40.0-49.9 (HCC) 04/19/2018   Morbid obesity (HCC) 04/19/2018   Cataract    Fibroid    Endometriosis    DM (diabetes mellitus) type II controlled with renal manifestation (HCC) 06/17/2010   Dyslipidemia 06/17/2010   Microcytic anemia 06/17/2010   Essential hypertension 06/17/2010   Allergic rhinitis 06/17/2010   Osteoarthritis 06/17/2010   LOW BACK PAIN 06/17/2010   OSTEOPENIA 06/17/2010   BREAST CANCER, 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     Objective:     BP (!) 150/86   Pulse 90   Temp 97.7 F (36.5 C) (Oral)   Ht 5\' 2"  (1.575 m)   Wt 231 lb 12.8 oz (105.1 kg)   SpO2 97%   BMI 42.40 kg/m    Physical Exam Vitals reviewed.  Constitutional:      Appearance: Normal appearance. She is morbidly obese.  Eyes:     Conjunctiva/sclera: Conjunctivae normal.  Pulmonary:     Effort: Pulmonary effort is normal.  Musculoskeletal:     Right lower leg: Edema (1+ BLE, at baseline for patient) present.     Left lower leg: Edema present.  Neurological:     Mental Status: She is alert and oriented to person, place, and time. Mental status is at baseline.  Psychiatric:        Mood and Affect: Mood normal.        Behavior: Behavior normal.        Thought Content: Thought content normal.    Diabetic Foot Exam - Simple   Simple Foot Form Diabetic Foot exam was performed with the following findings: Yes 11/13/2022  1:36 PM  Visual Inspection No deformities, no ulcerations, no other skin breakdown bilaterally: Yes Sensation Testing Intact to touch and monofilament testing bilaterally: Yes Pulse Check Posterior Tibialis and Dorsalis pulse intact bilaterally: Yes Comments       Results for orders placed or performed in visit on 11/13/22  POC HgB A1c  Result Value Ref Range   Hemoglobin A1C 6.6 (A) 4.0 - 5.6 %   HbA1c POC (<> result, manual entry)     HbA1c, POC (prediabetic range)     HbA1c, POC (controlled diabetic range)        The ASCVD Risk score (Arnett DK, et al., 2019) failed to calculate for the following reasons:   The 2019 ASCVD risk score is only valid for ages 25 to 4    Assessment & Plan:  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 RTC in 6 months for follow up, foot exam performed today.   Orders: -     POCT glycosylated hemoglobin (Hb A1C) -     Lipid panel  Essential hypertension Assessment & Plan: Usually is controlled, BP elevated today due to patient missing her medication, will recheck next visit.  Orders: -     hydroCHLOROthiazide; Take 1 tablet (12.5 mg total) by mouth daily.  Dispense: 90 tablet; Refill: 1  Stage 3b chronic kidney disease (HCC) Assessment & Plan: Checking CMP today.  Orders: -     Comprehensive metabolic panel  Primary osteoarthritis involving multiple joints Assessment & Plan: Patient has chronic OA in multiple joints, she recently had a shoulder replacement surgery in 2022. Has been on chronic narcotics in the past, she cannot take NSAIDS due to her CKD stage 3, tylenol only partially effective at controlling her pain. Pt is not reporting that she is having difficulty tolerating the norco-- making her very "heavy"/ tired/sleepy. I will refill the voltaren 1% gel, 2 grams QID PRN. She does not need refills today per her report.   Orders: -     Diclofenac Sodium; Apply 2 g topically 4 (four) times daily.  Dispense: 150 g; Refill: 5  Microcytic anemia Assessment & Plan: Needs new  CBC  Orders: -     CBC with Differential/Platelet     Return in about 6 months (around 05/16/2023) for DM.    Karie Georges, MD

## 2022-11-13 NOTE — Assessment & Plan Note (Signed)
Checking CMP today

## 2022-11-13 NOTE — Assessment & Plan Note (Signed)
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 Has been on chronic narcotics in the past, she cannot take NSAIDS due to her CKD stage 3, tylenol only partially effective at controlling her pain. Pt is not reporting that she is having difficulty tolerating the norco-- making her very "heavy"/ tired/sleepy. I will refill the voltaren 1% gel, 2 grams QID PRN. She does not need refills today per her report.

## 2022-11-13 NOTE — Assessment & Plan Note (Addendum)
A1C today is 6.6, controlled, will continue the glipizide 5 mg daily and metformin 500 mg BID, and actos 45 mg daily. RTC in 6 months for follow up, foot exam performed today.

## 2022-11-13 NOTE — Assessment & Plan Note (Signed)
Usually is controlled, BP elevated today due to patient missing her medication, will recheck next visit.

## 2022-11-13 NOTE — Assessment & Plan Note (Signed)
Needs new CBC

## 2022-11-17 DIAGNOSIS — J301 Allergic rhinitis due to pollen: Secondary | ICD-10-CM | POA: Diagnosis not present

## 2022-11-17 DIAGNOSIS — J3089 Other allergic rhinitis: Secondary | ICD-10-CM | POA: Diagnosis not present

## 2022-11-17 DIAGNOSIS — J3081 Allergic rhinitis due to animal (cat) (dog) hair and dander: Secondary | ICD-10-CM | POA: Diagnosis not present

## 2022-11-20 ENCOUNTER — Other Ambulatory Visit: Payer: Self-pay | Admitting: *Deleted

## 2022-11-20 MED ORDER — POLYSACCHARIDE IRON COMPLEX 150 MG PO CAPS: 150.0000 mg | ORAL_CAPSULE | Freq: Every day | ORAL | 1 refills | Status: DC

## 2022-11-25 ENCOUNTER — Telehealth: Payer: Self-pay | Admitting: Family Medicine

## 2022-11-25 ENCOUNTER — Telehealth: Payer: Self-pay

## 2022-11-25 ENCOUNTER — Other Ambulatory Visit (HOSPITAL_COMMUNITY): Payer: Self-pay

## 2022-11-25 NOTE — Telephone Encounter (Signed)
Patient Advocate Encounter  Prior Authorization for [employer] has been approved with [employer].    PA# [id number] Effective dates: 11/25/22 through 11/25/23  Per [facility] test claim, copay for 50 days supply is $0

## 2022-11-25 NOTE — Telephone Encounter (Signed)
Pt is calling and needs PA glucose blood (ONETOUCH VERIO) test strip  pt check bs 2-4 times a day

## 2022-11-25 NOTE — Telephone Encounter (Signed)
PA submitted via CMM. Created new encounter for PA. Will route back to pool once determination has been made. 

## 2022-11-25 NOTE — Telephone Encounter (Signed)
Pharmacy Patient Advocate Encounter   Received notification from [facility] that prior authorization for OneTouch Verio strips is required/requested.   PA submitted on 11/25/22 to (ins) [facility] via CoverMyMeds Key or (Medicaid) confirmation # [id number] Status is pending

## 2022-11-27 NOTE — Telephone Encounter (Signed)
Spoke with Rayfield Citizen at [place] and informed her of the message below.  She stated the patient already picked up the Rx.

## 2022-11-27 NOTE — Telephone Encounter (Signed)
Pt called, returning CMA's call. CMA was with a patient Pt asked that CMA call back at her earliest convenience. 

## 2022-11-27 NOTE — Telephone Encounter (Signed)
I called the patient for more information.  She stated she thought she saw our number on her phone where someone called earlier.  I advised the patient I did not call; however spoke with her pharmacy earlier about the test strips that was approved by the insurance.

## 2022-12-02 ENCOUNTER — Ambulatory Visit (INDEPENDENT_AMBULATORY_CARE_PROVIDER_SITE_OTHER): Payer: Medicare HMO | Admitting: Family Medicine

## 2022-12-02 DIAGNOSIS — Z0000 Encounter for general adult medical examination without abnormal findings: Secondary | ICD-10-CM

## 2022-12-02 NOTE — Patient Instructions (Signed)
I really enjoyed getting to talk with you today! I am available on Tuesdays and Thursdays for virtual visits if you have any questions or concerns, or if I can be of any further assistance.   CHECKLIST FROM ANNUAL WELLNESS VISIT:  -Follow up (please call to schedule if not scheduled after visit):   -yearly for annual wellness visit with primary care office  Here is a list of your preventive care/health maintenance measures and the plan for each if any are due:  PLAN For any measures below that may be due:  -can get the vaccines due at the pharmacy  Health Maintenance  Topic Date Due   DTaP/Tdap/Td (1 - Tdap) Never done   Zoster Vaccines- Shingrix (1 of 2) Never done   OPHTHALMOLOGY EXAM  10/03/2022   COVID-19 Vaccine (4 - 2023-24 season)  02/28/2022   INFLUENZA VACCINE  01/29/2023   Diabetic kidney evaluation - Urine ACR  05/14/2023   MAMMOGRAM  05/15/2023   HEMOGLOBIN A1C  05/16/2023   Diabetic kidney evaluation - eGFR measurement  11/13/2023   FOOT EXAM  11/13/2023   Medicare Annual Wellness (AWV)  12/02/2023   Pneumonia Vaccine 9+ Years old  Completed   DEXA SCAN  Completed   HPV VACCINES  Aged Out   PAP SMEAR-Modifier  Discontinued   Hepatitis C Screening  Discontinued    -See a dentist at least yearly  -Get your eyes checked and then per your eye specialist's recommendations  -Other issues addressed today:   -I have included below further information regarding a healthy whole foods based diet, physical activity guidelines for adults, stress management and opportunities for social connections. I hope you find this information useful.   -----------------------------------------------------------------------------------------------------------------------------------------------------------------------------------------------------------------------------------------------------------  NUTRITION: -eat real food: lots of colorful vegetables (half the plate) and  fruits -5-7 servings of vegetables and fruits per day (fresh or steamed is best), exp. 2 servings of vegetables with lunch and dinner and 2 servings of fruit per day. Berries and greens such as kale and collards are great choices.  -consume on a regular basis: whole grains (make sure first ingredient on label contains the word "whole"), fresh fruits, fish, nuts, seeds, healthy oils (such as olive oil, avocado oil, grape seed oil) -may eat small amounts of dairy and lean meat on occasion, but avoid processed meats such as ham, bacon, lunch meat, etc. -drink water -try to avoid fast food and pre-packaged foods, processed meat -most experts advise limiting sodium to < 2300mg  per day, should limit further is any chronic conditions such as high blood pressure, heart disease, diabetes, etc. The American Heart Association advised that < 1500mg  is is ideal -try to avoid foods that contain any ingredients with names you do not recognize  -try to avoid sugar/sweets (except for the natural sugar that occurs in fresh fruit) -try to avoid sweet drinks -try to avoid white rice, white bread, pasta (unless whole grain), white or yellow potatoes  EXERCISE GUIDELINES FOR ADULTS: -if you wish to increase your physical activity, do so gradually and with the approval of your doctor -STOP and seek medical care immediately if you have any chest pain, chest discomfort or trouble breathing when starting or increasing exercise  -move and stretch your body, legs, feet and arms when sitting for long periods -Physical activity guidelines for optimal health in adults: -least 150 minutes per week of aerobic exercise (can talk, but not sing) once approved by your doctor, 20-30 minutes of sustained activity or two 10 minute episodes of  sustained activity every day.  -resistance training at least 2 days per week if approved by your doctor -balance exercises 3+ days per week:   Stand somewhere where you have something sturdy to  hold onto if you lose balance.    1) lift up on toes, start with 5x per day and work up to 20x   2) stand and lift on leg straight out to the side so that foot is a few inches of the floor, start with 5x each side and work up to 20x each side   3) stand on one foot, start with 5 seconds each side and work up to 20 seconds on each side  If you need ideas or help with getting more active:  -Silver sneakers https://tools.silversneakers.com  -Walk with a Doc: http://www.duncan-williams.com/  -try to include resistance (weight lifting/strength building) and balance exercises twice per week: or the following link for ideas: http://castillo-powell.com/  BuyDucts.dk  STRESS MANAGEMENT: -can try meditating, or just sitting quietly with deep breathing while intentionally relaxing all parts of your body for 5 minutes daily -if you need further help with stress, anxiety or depression please follow up with your primary doctor or contact the wonderful folks at [facility]: [phone]  SOCIAL CONNECTIONS: -options in [place] if you wish to engage in more social and exercise related activities:  -Silver sneakers https://tools.silversneakers.com  -Walk with a Doc: http://www.duncan-williams.com/  -Check out the [facility] Active Adults 50+ section on the [employer] (hiking clubs, book clubs, cards and games, chess, exercise classes, aquatic classes and much more) - see the website for details: https://www.[place].gov/departments/parks-recreation/active-adults50  -YouTube has lots of exercise videos for different ages and abilities as well  -[facility] (a variety of indoor and outdoor inperson activities for adults). 6038570392. [address]  -Virtual Online Classes (a variety of topics): see seniorplanet.org or call 6805527481  -consider volunteering  at a school, hospice center, church, senior center or elsewhere

## 2022-12-02 NOTE — Progress Notes (Signed)
PATIENT CHECK-IN and HEALTH RISK ASSESSMENT QUESTIONNAIRE:  -completed by phone/video for upcoming Medicare Preventive Visit  Pre-Visit Check-in: 1)Vitals (height, wt, BP, etc) - record in vitals section for visit on day of visit 2)Review and Update Medications, Allergies PMH, Surgeries, Social history in Epic 3)Hospitalizations in the last year with date/reason? No   4)Review and Update Care Team (patient's specialists) in Epic 5) Complete PHQ9 in Epic  6) Complete Fall Screening in Epic 7)Review all Health Maintenance Due and order under PCP if not done.  8)Medicare Wellness Questionnaire: Answer theses question about your habits: Do you drink alcohol?  No  If yes, how many drinks do you have a day? Have you ever smoked? No  Quit date if applicable?   How many packs a day do/did you smoke?  Do you use smokeless tobacco?no  Do you use an illicit drugs? No  Do you exercises?  Sometimes exercises at home with some chair exercises She cooks her food Typical breakfast does not do breakfast... may eat oatmeal  Typical lunch cabbage, leftovers Typical dinner tries not to eat after 5 or 6  Typical snacks nabs or a cupcake if sugar is low  Beverages: water  Answer theses question about you: Can you perform most household chores? yes Do you find it hard to follow a conversation in a noisy room? No  Do you often ask people to speak up or repeat themselves?no  Do you feel that you have a problem with memory? Little bit Do you balance your checkbook and or bank acounts?yes Do you feel safe at home? yes Last dentist visit? first of 2024 Do you need assistance with any of the following: Please note if so no   Driving?  Feeding yourself?  Getting from bed to chair?  Getting to the toilet?  Bathing or showering?  Dressing yourself?  Managing money?  Climbing a flight of stairs  Preparing meals?  Do you have Advanced Directives in place (Living Will, Healthcare Power or Attorney)? No     Last eye Exam and location? Visit coming up. [facility]. Reports already scheduled.    Do you currently use prescribed or non-prescribed narcotic or opioid pain medications? no - uses only as needed and tries to minimize  Do you have a history or close family history of breast, ovarian, tubal or peritoneal cancer or a family member with BRCA (breast cancer susceptibility 1 and 2) gene mutations? No, Lumpectomy on right side.  Nurse/Assistant Credentials/time stamp: T Wade-CMA  2:58 pm   ----------------------------------------------------------------------------------------------------------------------------------------------------------------------------------------------------------------------   MEDICARE ANNUAL PREVENTIVE VISIT WITH PROVIDER: (Welcome to Medicare, initial annual wellness or annual wellness exam)  Virtual Visit via Phone Note  I connected with Doris Jackson on 12/02/22 by phone and verified that I am speaking with the correct person using two identifiers.  Location patient: home Location provider:work or home office Persons participating in the virtual visit: patient, provider  Concerns and/or follow up today: reports everything is about the same. Sometimes has some shoulder pain - but doing ok today.    See HM section in Epic for other details of completed HM.    ROS: negative for report of fevers, unintentional weight loss, vision changes, vision loss, hearing loss or change, chest pain, sob, hemoptysis, melena, hematochezia, hematuria, falls, bleeding or bruising, thoughts of suicide or self harm, memory loss  Patient-completed extensive health risk assessment - reviewed and discussed with the patient: See Health Risk Assessment completed with patient prior to the visit either  above or in recent phone note. This was reviewed in detailed with the patient today and appropriate recommendations, orders and referrals were placed as needed per Summary below  and patient instructions.   Review of Medical History: -PMH, PSH, Family History and current specialty and care providers reviewed and updated and listed below   Patient Care Team: Karie Georges, MD as PCP - General (Family Medicine) Lysle Rubens, OT as Occupational Therapist (Occupational Therapy)   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   HYPERLIPIDEMIA 06/17/2010   HYPERTENSION 06/17/2010   Leg swelling    LOW BACK PAIN 06/17/2010   OSTEOARTHRITIS 06/17/2010   OSTEOPENIA 06/17/2010   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   Weakness     Past Surgical History:  Procedure Laterality Date   ABDOMINAL HYSTERECTOMY  1988   TAH,LSO   ANTERIOR CERVICAL DECOMP/DISCECTOMY FUSION N/A 09/15/2019   Procedure: Cervical five CorpectomyANTERIOR CERVICAL DECOMPRESSION/DISCECTOMY FUSION CERVICAL THREE-CERVICAL Four;  Surgeon: Coletta Memos, MD;  Location: [facility] OR;  Service: Neurosurgery;  Laterality: N/A;   BREAST LUMPECTOMY Right 2007   LUMPECTOMY FOLLOWED BY RADIATION   COLONOSCOPY     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   EYE SURGERY Bilateral    Cataract   HARDWARE REMOVAL N/A 10/21/2019   Procedure: Anterior cervical plate removal with revision;  Surgeon: Coletta Memos, MD;  Location: [facility] OR;  Service: Neurosurgery;  Laterality: N/A;  3C   Lazer Bilateral    OOPHORECTOMY  1988   TAH,LSO   REVERSE SHOULDER ARTHROPLASTY Right 09/19/2020   Procedure: REVERSE SHOULDER ARTHROPLASTY;  Surgeon: Bjorn Pippin,  MD;  Location: [facility] ORS;  Service: Orthopedics;  Laterality: Right;   TUBAL LIGATION      Social History   Socioeconomic History   Marital status: Married    Spouse name: Not on file   Number of children: 3   Years of education: college   Highest education level: Not on file  Occupational History   Not on file  Tobacco Use   Smoking status: Never   Smokeless tobacco: Never  Vaping Use   Vaping Use: Never used  Substance and Sexual Activity   Alcohol use: No   Drug use: No   Sexual activity: Never    Birth control/protection: Post-menopausal, Surgical    Comment: Tubal Ligation  Other Topics Concern   Not on file  Social History Narrative   Retired Agricultural engineer   Social Determinants of Health   Financial Resource Strain: Low Risk  (11/26/2021)   Overall Financial Resource Strain (CARDIA)    Difficulty of Paying Living Expenses: Not hard at all  Food Insecurity: No Food Insecurity (11/26/2021)   Hunger Vital Sign    Worried About Running Out of Food in the Last Year: Never true    Ran Out of Food in the Last Year: Never true  Transportation Needs: No Transportation Needs (11/26/2021)   PRAPARE - Administrator, Civil Service (Medical): No  Lack of Transportation (Non-Medical): No  Physical Activity: Insufficiently Active (11/26/2021)   Exercise Vital Sign    Days of Exercise per Week: 3 days    Minutes of Exercise per Session: 20 min  Stress: No Stress Concern Present (11/26/2021)   [employer] of Occupational Health - Occupational Stress Questionnaire    Feeling of Stress : Not at all  Social Connections: Moderately Isolated (11/13/2020)   Social Connection and Isolation Panel [NHANES]    Frequency of Communication with Friends and Family: Twice a week    Frequency of Social Gatherings with Friends and Family: Twice a week    Attends Religious Services: Never    Database administrator or Organizations: No    Attends Banker Meetings:  Never    Marital Status: Married  Catering manager Violence: Not At Risk (11/13/2020)   Humiliation, Afraid, Rape, and Kick questionnaire    Fear of Current or Ex-Partner: No    Emotionally Abused: No    Physically Abused: No    Sexually Abused: No    Family History  Problem Relation Age of Onset   Diabetes Mother    Hypertension Sister    Asthma Father    Diabetes Brother     Current Outpatient Medications on File Prior to Visit  Medication Sig Dispense Refill   acetaminophen (TYLENOL) 500 MG tablet Take 500 mg by mouth every 6 (six) hours as needed.     Ascorbic Acid (VITAMIN C) 1000 MG tablet Take 1,000 mg by mouth daily.     benazepril (LOTENSIN) 40 MG tablet Take 1 tablet by mouth once daily 90 tablet 3   benzonatate (TESSALON) 200 MG capsule Take 200 mg by mouth 3 (three) times daily as needed for cough.     Blood Glucose Monitoring Suppl (ONETOUCH VERIO REFLECT) w/Device KIT USE UP TO FOUR TIMES DAILY AS DIRECTED. 1 kit 1   Cholecalciferol (VITAMIN D) 2000 UNITS CAPS Take 4,000 Units by mouth daily.      cyanocobalamin 2000 MCG tablet Take 2,000 mcg by mouth daily.     diclofenac Sodium (VOLTAREN) 1 % GEL Apply 2 g topically 4 (four) times daily. 150 g 5   fexofenadine (ALLEGRA) 180 MG tablet Take 180 mg by mouth daily as needed for allergies.     fluticasone (FLONASE) 50 MCG/ACT nasal spray Place 1 spray into both nostrils daily.     furosemide (LASIX) 20 MG tablet Take 1 tablet (20 mg total) by mouth daily as needed. 90 tablet 1   glipiZIDE (GLUCOTROL) 5 MG tablet Take 1 tablet (5 mg total) by mouth daily before breakfast. 90 tablet 3   glucose blood (ONETOUCH VERIO) test strip USE STRIPS TO CHECK GLUCOSE 2 TO 4 TIMES DAILY AS NEEDED FOR GLUCOSE CONTROL 200 each 3   hydrochlorothiazide (HYDRODIURIL) 12.5 MG tablet Take 1 tablet (12.5 mg total) by mouth daily. 90 tablet 1   HYDROcodone-acetaminophen (NORCO) 5-325 MG tablet Take 1 tablet by mouth daily as needed for moderate  pain. 30 tablet 0   HYDROcodone-acetaminophen (NORCO) 5-325 MG tablet Take 1 tablet by mouth daily as needed for moderate pain. 30 tablet 0   HYDROcodone-acetaminophen (NORCO/VICODIN) 5-325 MG tablet Take 1 tablet by mouth daily as needed. 30 tablet 0   iron polysaccharides (FERREX 150) 150 MG capsule Take 1 capsule (150 mg total) by mouth daily. 90 capsule 1   Lancets 30G MISC USE TWICE DAILY AS DIRECTED FOR GLUCOSE TESTING AND MONITORING 100 each 4  Lancets MISC Use as directed 100 each 3   metFORMIN (GLUCOPHAGE-XR) 500 MG 24 hr tablet Take 1 tablet (500 mg total) by mouth 2 (two) times daily. 180 tablet 1   metoprolol succinate (TOPROL-XL) 50 MG 24 hr tablet Take 1 tablet (50 mg total) by mouth daily. Take with or immediately following a meal. 90 tablet 1   ONE TOUCH LANCETS MISC Test twice daily. 200 each 5   OneTouch Delica Lancets 33G MISC Use as directed once a day 100 each 1   pioglitazone (ACTOS) 45 MG tablet TAKE 1 TABLET BY MOUTH ONCE DAILY . APPOINTMENT REQUIRED FOR FUTURE REFILLS 90 tablet 1   pravastatin (PRAVACHOL) 40 MG tablet Take 1 tablet (40 mg total) by mouth daily. 90 tablet 3   prednisoLONE acetate (PRED FORTE) 1 % ophthalmic suspension Place 1 drop into both eyes See admin instructions. Place 1 drop into left eye three times daily Place 1 drop into right eye once daily     Probiotic Product (PROBIOTIC-10 PO) Take 1 capsule by mouth daily.     senna-docusate (SENOKOT-S) 8.6-50 MG tablet Take 1 tablet by mouth at bedtime. 30 tablet 0   No current facility-administered medications on file prior to visit.    Allergies  Allergen Reactions   Dust Mite Extract Itching and Cough   Latex Itching    Powder gloves       Physical Exam There were no vitals filed for this visit. Estimated body mass index is 42.4 kg/m as calculated from the following:   Height as of 11/13/22: 5\' 2"  (1.575 m).   Weight as of 11/13/22: 231 lb 12.8 oz (105.1 kg).  EKG (optional): deferred due  to virtual visit  GENERAL: alert, oriented, no acute distress detected, full vision exam deferred due to pandemic and/or virtual encounter  PSYCH/NEURO: pleasant and cooperative, no obvious depression or anxiety, speech and thought processing grossly intact, Cognitive function grossly intact        12/02/2022    2:44 PM 08/29/2022    2:41 PM 03/14/2022    1:49 PM 11/26/2021    3:12 PM 07/15/2021   11:33 AM  Depression screen PHQ 2/9  Decreased Interest 0 0 0 0 0  Down, Depressed, Hopeless 0 0 1 0 0  PHQ - 2 Score 0 0 1 0 0       09/27/2020   11:00 AM 11/13/2020    3:27 PM 07/03/2021    5:00 PM 11/26/2021    3:11 PM 12/02/2022    2:44 PM  Fall Risk  Falls in the past year?  1 1 0 0  Was there an injury with Fall?  1 1 0 0  Was there an injury with Fall? - Comments  [place] surgery     Fall Risk Category Calculator  3 2 0 0  Fall Risk Category (Retired)  High Moderate Low   (RETIRED) Patient Fall Risk Level High fall risk  Moderate fall risk Moderate fall risk   Patient at Risk for Falls Due to  History of fall(s);Impaired balance/gait;Impaired mobility;Impaired vision History of fall(s);Impaired balance/gait;Impaired mobility Impaired mobility;Medication side effect No Fall Risks  Fall risk Follow up  Falls prevention discussed  Falls evaluation completed;Education provided;Falls prevention discussed Falls evaluation completed     SUMMARY AND PLAN:  Encounter for Medicare annual wellness exam   Discussed applicable health maintenance/preventive health measures and advised and referred or ordered per patient preferences: -discussed vaccines due, recs and how to obtain each -she  reports her eye exam is already scheduled  Health Maintenance  Topic Date Due   DTaP/Tdap/Td (1 - Tdap) Never done   Zoster Vaccines- Shingrix (1 of 2) Never done   OPHTHALMOLOGY EXAM  10/03/2022   COVID-19 Vaccine (4 - 2023-24 season) 10/14/2023 (Originally 02/28/2022)   INFLUENZA VACCINE  01/29/2023    Diabetic kidney evaluation - Urine ACR  05/14/2023   MAMMOGRAM  05/15/2023   HEMOGLOBIN A1C  05/16/2023   Diabetic kidney evaluation - eGFR measurement  11/13/2023   FOOT EXAM  11/13/2023   Medicare Annual Wellness (AWV)  12/02/2023   Pneumonia Vaccine 71+ Years old  Completed   DEXA SCAN  Completed   HPV VACCINES  Aged Out   PAP SMEAR-Modifier  Discontinued   Hepatitis C Screening  Discontinued    Education and counseling on the following was provided based on the above review of health and a plan/checklist for the patient, along with additional information discussed, was provided for the patient in the patient instructions :  -Advised on importance of completing advanced directives, discussed options for completing and provided information in patient instructions as well -Provided counseling and plan for difficulty hearing  -Provided counseling and plan for increased risk of falling if applicable per above screening. Reviewed and demonstrated safe balance exercises that can be done at home to improve balance and discussed exercise guidelines for adults with include balance exercises at least 3 days per week.  -Advised and counseled on a healthy lifestyle - including the importance of a healthy diet, regular physical activity, social connections and stress management. -Reviewed patient's current diet. Advised and counseled on a whole foods based healthy diet. A summary of a healthy diet was provided in the Patient Instructions.  -reviewed patient's current physical activity level and discussed exercise guidelines for adults. Discussed  ideas for safe exercise at home to assist in meeting exercise guideline recommendations in a safe and healthy way.  -Advise yearly dental visits at minimum and regular eye exams   Follow up: see patient instructions     Patient Instructions  I really enjoyed getting to talk with you today! I am available on Tuesdays and Thursdays for virtual visits if you  have any questions or concerns, or if I can be of any further assistance.   CHECKLIST FROM ANNUAL WELLNESS VISIT:  -Follow up (please call to schedule if not scheduled after visit):   -yearly for annual wellness visit with primary care office  Here is a list of your preventive care/health maintenance measures and the plan for each if any are due:  PLAN For any measures below that may be due:  -can get the vaccines due at the pharmacy  Health Maintenance  Topic Date Due   DTaP/Tdap/Td (1 - Tdap) Never done   Zoster Vaccines- Shingrix (1 of 2) Never done   OPHTHALMOLOGY EXAM  10/03/2022   COVID-19 Vaccine (4 - 2023-24 season)  02/28/2022   INFLUENZA VACCINE  01/29/2023   Diabetic kidney evaluation - Urine ACR  05/14/2023   MAMMOGRAM  05/15/2023   HEMOGLOBIN A1C  05/16/2023   Diabetic kidney evaluation - eGFR measurement  11/13/2023   FOOT EXAM  11/13/2023   Medicare Annual Wellness (AWV)  12/02/2023   Pneumonia Vaccine 25+ Years old  Completed   DEXA SCAN  Completed   HPV VACCINES  Aged Out   PAP SMEAR-Modifier  Discontinued   Hepatitis C Screening  Discontinued    -See a dentist at least yearly  -Get  your eyes checked and then per your eye specialist's recommendations  -Other issues addressed today:   -I have included below further information regarding a healthy whole foods based diet, physical activity guidelines for adults, stress management and opportunities for social connections. I hope you find this information useful.   -----------------------------------------------------------------------------------------------------------------------------------------------------------------------------------------------------------------------------------------------------------  NUTRITION: -eat real food: lots of colorful vegetables (half the plate) and fruits -5-7 servings of vegetables and fruits per day (fresh or steamed is best), exp. 2 servings of vegetables with lunch  and dinner and 2 servings of fruit per day. Berries and greens such as kale and collards are great choices.  -consume on a regular basis: whole grains (make sure first ingredient on label contains the word "whole"), fresh fruits, fish, nuts, seeds, healthy oils (such as olive oil, avocado oil, grape seed oil) -may eat small amounts of dairy and lean meat on occasion, but avoid processed meats such as ham, bacon, lunch meat, etc. -drink water -try to avoid fast food and pre-packaged foods, processed meat -most experts advise limiting sodium to < 2300mg  per day, should limit further is any chronic conditions such as high blood pressure, heart disease, diabetes, etc. The American Heart Association advised that < 1500mg  is is ideal -try to avoid foods that contain any ingredients with names you do not recognize  -try to avoid sugar/sweets (except for the natural sugar that occurs in fresh fruit) -try to avoid sweet drinks -try to avoid white rice, white bread, pasta (unless whole grain), white or yellow potatoes  EXERCISE GUIDELINES FOR ADULTS: -if you wish to increase your physical activity, do so gradually and with the approval of your doctor -STOP and seek medical care immediately if you have any chest pain, chest discomfort or trouble breathing when starting or increasing exercise  -move and stretch your body, legs, feet and arms when sitting for long periods -Physical activity guidelines for optimal health in adults: -least 150 minutes per week of aerobic exercise (can talk, but not sing) once approved by your doctor, 20-30 minutes of sustained activity or two 10 minute episodes of sustained activity every day.  -resistance training at least 2 days per week if approved by your doctor -balance exercises 3+ days per week:   Stand somewhere where you have something sturdy to hold onto if you lose balance.    1) lift up on toes, start with 5x per day and work up to 20x   2) stand and lift on leg  straight out to the side so that foot is a few inches of the floor, start with 5x each side and work up to 20x each side   3) stand on one foot, start with 5 seconds each side and work up to 20 seconds on each side  If you need ideas or help with getting more active:  -Silver sneakers https://tools.silversneakers.com  -Walk with a Doc: http://www.duncan-williams.com/  -try to include resistance (weight lifting/strength building) and balance exercises twice per week: or the following link for ideas: http://castillo-powell.com/  BuyDucts.dk  STRESS MANAGEMENT: -can try meditating, or just sitting quietly with deep breathing while intentionally relaxing all parts of your body for 5 minutes daily -if you need further help with stress, anxiety or depression please follow up with your primary doctor or contact the wonderful folks at [facility]: [phone]  SOCIAL CONNECTIONS: -options in [place] if you wish to engage in more social and exercise related activities:  -Silver sneakers https://tools.silversneakers.com  -Walk with a Doc: http://www.duncan-williams.com/  -Check  out the [facility] Active Adults 50+ section on the [place] [employer] (hiking clubs, book clubs, cards and games, chess, exercise classes, aquatic classes and much more) - see the website for details: https://www.[place].gov/departments/parks-recreation/active-adults50  -YouTube has lots of exercise videos for different ages and abilities as well  -[facility] (a variety of indoor and outdoor inperson activities for adults). [phone]. [address]  -Virtual Online Classes (a variety of topics): see seniorplanet.org or call [phone]  -consider volunteering at a school, hospice center, church, senior center or elsewhere           Terressa Koyanagi, DO

## 2022-12-09 DIAGNOSIS — J301 Allergic rhinitis due to pollen: Secondary | ICD-10-CM | POA: Diagnosis not present

## 2022-12-09 DIAGNOSIS — J3089 Other allergic rhinitis: Secondary | ICD-10-CM | POA: Diagnosis not present

## 2022-12-29 DIAGNOSIS — J3089 Other allergic rhinitis: Secondary | ICD-10-CM | POA: Diagnosis not present

## 2022-12-29 DIAGNOSIS — J3081 Allergic rhinitis due to animal (cat) (dog) hair and dander: Secondary | ICD-10-CM | POA: Diagnosis not present

## 2022-12-29 DIAGNOSIS — J301 Allergic rhinitis due to pollen: Secondary | ICD-10-CM | POA: Diagnosis not present

## 2023-01-07 DIAGNOSIS — H524 Presbyopia: Secondary | ICD-10-CM | POA: Diagnosis not present

## 2023-01-07 DIAGNOSIS — E119 Type 2 diabetes mellitus without complications: Secondary | ICD-10-CM | POA: Diagnosis not present

## 2023-01-07 LAB — HM DIABETES EYE EXAM

## 2023-01-12 ENCOUNTER — Other Ambulatory Visit: Payer: Self-pay | Admitting: Family Medicine

## 2023-01-13 ENCOUNTER — Other Ambulatory Visit: Payer: Self-pay | Admitting: Family Medicine

## 2023-01-20 DIAGNOSIS — J301 Allergic rhinitis due to pollen: Secondary | ICD-10-CM | POA: Diagnosis not present

## 2023-01-20 DIAGNOSIS — J3089 Other allergic rhinitis: Secondary | ICD-10-CM | POA: Diagnosis not present

## 2023-02-02 ENCOUNTER — Ambulatory Visit (INDEPENDENT_AMBULATORY_CARE_PROVIDER_SITE_OTHER): Payer: Medicare HMO | Admitting: Family Medicine

## 2023-02-02 ENCOUNTER — Encounter: Payer: Self-pay | Admitting: Family Medicine

## 2023-02-02 VITALS — BP 126/74 | HR 83 | Temp 98.2°F | Wt 232.0 lb

## 2023-02-02 DIAGNOSIS — J020 Streptococcal pharyngitis: Secondary | ICD-10-CM

## 2023-02-02 DIAGNOSIS — R059 Cough, unspecified: Secondary | ICD-10-CM

## 2023-02-02 DIAGNOSIS — J029 Acute pharyngitis, unspecified: Secondary | ICD-10-CM

## 2023-02-02 LAB — POC COVID19 BINAXNOW: SARS Coronavirus 2 Ag: NEGATIVE

## 2023-02-02 LAB — POCT RAPID STREP A (OFFICE): Rapid Strep A Screen: POSITIVE — AB

## 2023-02-02 MED ORDER — CEFUROXIME AXETIL 500 MG PO TABS: 500.0000 mg | ORAL_TABLET | Freq: Two times a day (BID) | ORAL | 0 refills | Status: AC

## 2023-02-02 NOTE — Addendum Note (Signed)
Addended by: Carola Rhine on: 02/02/2023 04:10 PM   Modules accepted: Orders

## 2023-02-02 NOTE — Progress Notes (Signed)
   Subjective:    Patient ID: Doris Jackson, female    DOB: Jun 26, 1941, 82 y.o.   MRN: [id number]  HPI Here for 3 days of a ST with PND and coughing up yellow sputum. No fever or SOB. Taking Benzonatate and Allegra.   Review of Systems  Constitutional: Negative.   HENT:  Positive for congestion and sore throat. Negative for ear pain and sinus pressure.   Eyes: Negative.   Respiratory:  Positive for cough. Negative for shortness of breath and wheezing.        Objective:   Physical Exam Constitutional:      Appearance: Normal appearance. She is not ill-appearing.  HENT:     Right Ear: Tympanic membrane, ear canal and external ear normal.     Left Ear: Tympanic membrane, ear canal and external ear normal.     Nose: Nose normal.     Mouth/Throat:     Pharynx: Oropharynx is clear.  Eyes:     Conjunctiva/sclera: Conjunctivae normal.  Pulmonary:     Effort: Pulmonary effort is normal.     Breath sounds: Normal breath sounds.  Lymphadenopathy:     Cervical: No cervical adenopathy.  Neurological:     Mental Status: She is alert.           Assessment & Plan:  Strep pharyngitis, treat with 10 days of Cefuroxime.  Gershon Crane, MD

## 2023-02-10 DIAGNOSIS — J3081 Allergic rhinitis due to animal (cat) (dog) hair and dander: Secondary | ICD-10-CM | POA: Diagnosis not present

## 2023-02-10 DIAGNOSIS — J301 Allergic rhinitis due to pollen: Secondary | ICD-10-CM | POA: Diagnosis not present

## 2023-02-10 DIAGNOSIS — J3089 Other allergic rhinitis: Secondary | ICD-10-CM | POA: Diagnosis not present

## 2023-02-16 ENCOUNTER — Other Ambulatory Visit: Payer: Self-pay | Admitting: Family Medicine

## 2023-02-16 DIAGNOSIS — I10 Essential (primary) hypertension: Secondary | ICD-10-CM

## 2023-02-19 ENCOUNTER — Telehealth: Payer: Self-pay | Admitting: Family Medicine

## 2023-02-19 ENCOUNTER — Other Ambulatory Visit: Payer: Self-pay | Admitting: Family Medicine

## 2023-02-19 DIAGNOSIS — M48061 Spinal stenosis, lumbar region without neurogenic claudication: Secondary | ICD-10-CM

## 2023-02-19 DIAGNOSIS — R600 Localized edema: Secondary | ICD-10-CM

## 2023-02-19 NOTE — Telephone Encounter (Signed)
Prescription Request  02/19/2023  LOV: 11/13/2022  What is the name of the medication or equipment?  HYDROcodone-acetaminophen (NORCO) 5-325 MG tablet   Have you contacted your pharmacy to request a refill? No   Which pharmacy would you like this sent to?   [facility] [address] [place], [place] - [address] [place] [address] Phone: [phone] Fax: [phone]    Patient notified that their request is being sent to the clinical staff for review and that they should receive a response within 2 business days.   Please advise at Mobile [phone] (mobile)

## 2023-02-20 MED ORDER — HYDROCODONE-ACETAMINOPHEN 5-325 MG PO TABS
1.0000 | ORAL_TABLET | Freq: Every day | ORAL | 0 refills | Status: DC | PRN
Start: 2023-02-20 — End: 2023-07-09

## 2023-02-20 NOTE — Addendum Note (Signed)
Addended by: Karie Georges on: 02/20/2023 08:10 AM   Modules accepted: Orders

## 2023-02-20 NOTE — Telephone Encounter (Signed)
Script sent  

## 2023-02-24 ENCOUNTER — Ambulatory Visit (INDEPENDENT_AMBULATORY_CARE_PROVIDER_SITE_OTHER): Payer: Medicare HMO | Admitting: Family Medicine

## 2023-02-24 ENCOUNTER — Telehealth: Payer: Self-pay | Admitting: *Deleted

## 2023-02-24 ENCOUNTER — Encounter: Payer: Self-pay | Admitting: Family Medicine

## 2023-02-24 VITALS — BP 124/74 | HR 70 | Temp 98.0°F | Ht 62.0 in | Wt 235.7 lb

## 2023-02-24 DIAGNOSIS — J029 Acute pharyngitis, unspecified: Secondary | ICD-10-CM

## 2023-02-24 DIAGNOSIS — J020 Streptococcal pharyngitis: Secondary | ICD-10-CM | POA: Diagnosis not present

## 2023-02-24 DIAGNOSIS — K219 Gastro-esophageal reflux disease without esophagitis: Secondary | ICD-10-CM

## 2023-02-24 LAB — POCT RAPID STREP A (OFFICE): Rapid Strep A Screen: NEGATIVE

## 2023-02-24 MED ORDER — FAMOTIDINE 20 MG PO TABS
20.0000 mg | ORAL_TABLET | Freq: Every day | ORAL | 1 refills | Status: AC
Start: 2023-02-24 — End: ?

## 2023-02-24 NOTE — Progress Notes (Signed)
Established Patient Office Visit  Subjective   Patient ID: Doris Jackson, female    DOB: 06-Dec-1940  Age: 82 y.o. MRN: [id number]  Chief Complaint  Patient presents with   Sore Throat        Sinusitis    HPI   Doris Jackson is seen with some persistent sore throat and nasal congestion symptoms.  She was seen here on the fifth and had negative COVID test and positive strep screen.  She was treated with cefuroxime for 10 days and states that her symptoms did improve some but not fully resolved.  She initially thought there may be some GERD symptoms.  She started back Pepcid recently 20 mg once daily and GERD has improved somewhat.  It was suggested that she consider Mucinex for nasal congestion but she never started that.  She has not had any documented fever.  No significant cough.  No known sick contacts.  No nausea or vomiting.  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   HYPERLIPIDEMIA 06/17/2010   HYPERTENSION 06/17/2010   Leg swelling    LOW BACK PAIN 06/17/2010   OSTEOARTHRITIS 06/17/2010   OSTEOPENIA 06/17/2010   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   Weakness    Past Surgical History:  Procedure Laterality Date   ABDOMINAL HYSTERECTOMY  1988   TAH,LSO   ANTERIOR CERVICAL DECOMP/DISCECTOMY FUSION N/A 09/15/2019   Procedure: Cervical five CorpectomyANTERIOR CERVICAL DECOMPRESSION/DISCECTOMY FUSION CERVICAL  THREE-CERVICAL Four;  Surgeon: Coletta Memos, MD;  Location: [facility] OR;  Service: Neurosurgery;  Laterality: N/A;   BREAST LUMPECTOMY Right 2007   LUMPECTOMY FOLLOWED BY RADIATION   COLONOSCOPY     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 She has never used smokeless tobacco. She reports that she does not drink alcohol and does not use drugs. family history includes Asthma in her father; Diabetes in her brother and mother; Hypertension in her sister. Allergies  Allergen Reactions   Dust Mite Extract Itching and Cough   Latex Itching    Powder gloves    Review of Systems  Constitutional:  Negative for chills and fever.  HENT:  Positive for congestion and sore throat. Negative for ear pain.   Respiratory:  Negative for cough and shortness of breath.   Cardiovascular:  Negative for chest pain.  Objective:     BP 124/74 (BP Location: Left Arm, Patient Position: Sitting, Cuff Size: Normal)   Pulse 70   Temp 98 F (36.7 C) (Oral)   Ht 5\' 2"  (1.575 m)   Wt 235 lb 11.2 oz (106.9 kg)   SpO2 98%   BMI 43.11 kg/m  BP Readings from Last 3 Encounters:  02/24/23 124/74  02/02/23 126/74  11/13/22 (!) 150/86   Wt Readings from Last 3 Encounters:  02/24/23 235 lb 11.2 oz (106.9 kg)  02/02/23 232 lb (105.2 kg)  11/13/22 231 lb 12.8 oz (105.1 kg)      Physical Exam Vitals reviewed.  Constitutional:      General: She is not in acute distress.     Appearance: She is well-developed. She is not ill-appearing or toxic-appearing.  HENT:     Right Ear: Tympanic membrane normal.     Left Ear: Tympanic membrane normal.     Mouth/Throat:     Comments: No significant posterior pharynx erythema.  No exudate. Cardiovascular:     Rate and Rhythm: Normal rate and regular rhythm.  Pulmonary:     Effort: Pulmonary effort is normal.     Breath sounds: Normal breath sounds. No wheezing or rales.  Musculoskeletal:     Cervical back: Neck supple.  Lymphadenopathy:     Cervical: No cervical adenopathy.  Neurological:     Mental Status: She is alert.      No results found for any visits on 02/24/23.    The ASCVD Risk score (Arnett DK, et al., 2019) failed to calculate for the following reasons:   The 2019 ASCVD risk score is only valid for ages 34 to 87    Assessment & Plan:   Persistent sore throat and nasal congestion symptoms.  Recent positive rapid screen for strep pharyngitis.  Patient completed course of cefuroxime .  Recheck rapid strep today= negative  -No indication for further antibiotics at this time. -Suggested she try over-the-counter plain Mucinex for her congestive symptoms. -Patient did request completing handicap sticker.  This is a 82-year-old annual.  This was completed -Continue close follow-up with primary for her chronic medical problems  Doris Peat, MD

## 2023-02-24 NOTE — Telephone Encounter (Signed)
[facility] faxed a refill request for Famotidine 20mg -take 1 tablet at bedtime-#90.  Message sent to PCP as Rx is not on the current medication list.

## 2023-02-24 NOTE — Patient Instructions (Signed)
Strep screen is negative.  Try the over the counter Mucinex twice daily.

## 2023-03-04 DIAGNOSIS — J3089 Other allergic rhinitis: Secondary | ICD-10-CM | POA: Diagnosis not present

## 2023-03-04 DIAGNOSIS — J301 Allergic rhinitis due to pollen: Secondary | ICD-10-CM | POA: Diagnosis not present

## 2023-03-09 DIAGNOSIS — J301 Allergic rhinitis due to pollen: Secondary | ICD-10-CM | POA: Diagnosis not present

## 2023-03-09 DIAGNOSIS — J3081 Allergic rhinitis due to animal (cat) (dog) hair and dander: Secondary | ICD-10-CM | POA: Diagnosis not present

## 2023-03-09 DIAGNOSIS — J3089 Other allergic rhinitis: Secondary | ICD-10-CM | POA: Diagnosis not present

## 2023-03-13 ENCOUNTER — Ambulatory Visit (INDEPENDENT_AMBULATORY_CARE_PROVIDER_SITE_OTHER): Payer: Medicare HMO

## 2023-03-13 DIAGNOSIS — Z23 Encounter for immunization: Secondary | ICD-10-CM | POA: Diagnosis not present

## 2023-03-16 DIAGNOSIS — J3081 Allergic rhinitis due to animal (cat) (dog) hair and dander: Secondary | ICD-10-CM | POA: Diagnosis not present

## 2023-03-16 DIAGNOSIS — J3089 Other allergic rhinitis: Secondary | ICD-10-CM | POA: Diagnosis not present

## 2023-03-16 DIAGNOSIS — J301 Allergic rhinitis due to pollen: Secondary | ICD-10-CM | POA: Diagnosis not present

## 2023-03-17 ENCOUNTER — Telehealth: Payer: Self-pay | Admitting: Family Medicine

## 2023-03-17 NOTE — Telephone Encounter (Signed)
I called the patient back and gave her the billing number below.

## 2023-03-17 NOTE — Telephone Encounter (Addendum)
Pt called to say she received a bill from [facility] // Date of Service:  05/27/22 --  for $225.00   Pt stated she already spoke to Billing [phone], was the # Pt was transferred to, earlier this morning, by me) and was told the issue could be as simple as the wrong code being used, and that she should reach out to her PCP.   Pt was seen by Dr. Casimiro Needle on 05/13/22, but I do not see DOS:  05/27/22.  Pt is adamant she did not go to any other [facility] facility on that day (05/27/22).   Pt is also asking which of her insurances was applied to her appointment on 05/13/22?  Please advise.

## 2023-03-17 NOTE — Telephone Encounter (Signed)
Spoke with the patient and informed her to contact the [facility] Billing office at [phone] as our office does not handle any billing/coding here.  Patient stated she could not find a pen to write down the number and asked that I call back and leave this on her voicemail.  Unable to leave a message due to voicemail being full-will attempt to call back.

## 2023-03-19 ENCOUNTER — Other Ambulatory Visit: Payer: Self-pay | Admitting: Family Medicine

## 2023-03-30 DIAGNOSIS — J3089 Other allergic rhinitis: Secondary | ICD-10-CM | POA: Diagnosis not present

## 2023-03-30 DIAGNOSIS — J3081 Allergic rhinitis due to animal (cat) (dog) hair and dander: Secondary | ICD-10-CM | POA: Diagnosis not present

## 2023-03-30 DIAGNOSIS — J301 Allergic rhinitis due to pollen: Secondary | ICD-10-CM | POA: Diagnosis not present

## 2023-04-07 DIAGNOSIS — J301 Allergic rhinitis due to pollen: Secondary | ICD-10-CM | POA: Diagnosis not present

## 2023-04-07 DIAGNOSIS — J3081 Allergic rhinitis due to animal (cat) (dog) hair and dander: Secondary | ICD-10-CM | POA: Diagnosis not present

## 2023-04-07 DIAGNOSIS — J3089 Other allergic rhinitis: Secondary | ICD-10-CM | POA: Diagnosis not present

## 2023-04-08 ENCOUNTER — Other Ambulatory Visit: Payer: Self-pay | Admitting: Family Medicine

## 2023-04-14 NOTE — Telephone Encounter (Signed)
Pt checking on progress of this refill request

## 2023-04-27 ENCOUNTER — Encounter: Payer: Self-pay | Admitting: Family Medicine

## 2023-04-27 ENCOUNTER — Ambulatory Visit (INDEPENDENT_AMBULATORY_CARE_PROVIDER_SITE_OTHER): Payer: Medicare HMO | Admitting: Family Medicine

## 2023-04-27 VITALS — BP 136/78 | HR 80 | Temp 97.5°F | Ht 62.0 in | Wt 237.8 lb

## 2023-04-27 DIAGNOSIS — E1129 Type 2 diabetes mellitus with other diabetic kidney complication: Secondary | ICD-10-CM | POA: Diagnosis not present

## 2023-04-27 DIAGNOSIS — Z7984 Long term (current) use of oral hypoglycemic drugs: Secondary | ICD-10-CM | POA: Diagnosis not present

## 2023-04-27 DIAGNOSIS — I10 Essential (primary) hypertension: Secondary | ICD-10-CM

## 2023-04-27 DIAGNOSIS — E785 Hyperlipidemia, unspecified: Secondary | ICD-10-CM

## 2023-04-27 LAB — POCT GLYCOSYLATED HEMOGLOBIN (HGB A1C): Hemoglobin A1C: 6.8 % — AB (ref 4.0–5.6)

## 2023-04-27 MED ORDER — METOPROLOL SUCCINATE ER 50 MG PO TB24: 50.0000 mg | ORAL_TABLET | Freq: Every day | ORAL | 1 refills | Status: DC

## 2023-04-27 MED ORDER — METFORMIN HCL ER 500 MG PO TB24
500.0000 mg | ORAL_TABLET | Freq: Two times a day (BID) | ORAL | 1 refills | Status: DC
Start: 2023-04-27 — End: 2023-10-19

## 2023-04-27 MED ORDER — HYDROCHLOROTHIAZIDE 12.5 MG PO TABS: 12.5000 mg | ORAL_TABLET | Freq: Every day | ORAL | 1 refills | Status: DC

## 2023-04-27 MED ORDER — PIOGLITAZONE HCL 45 MG PO TABS: ORAL_TABLET | ORAL | 1 refills | Status: DC

## 2023-04-27 MED ORDER — PRAVASTATIN SODIUM 40 MG PO TABS: 40.0000 mg | ORAL_TABLET | Freq: Every day | ORAL | 3 refills | Status: DC

## 2023-04-27 NOTE — Assessment & Plan Note (Signed)
On pravastatin, doing well, I have reviewed her last lipid panel with her, continue this as prescribed, refills setn

## 2023-04-27 NOTE — Assessment & Plan Note (Signed)
Current hypertension medications:       Sig   benazepril (LOTENSIN) 40 MG tablet (Taking) Take 1 tablet by mouth once daily   furosemide (LASIX) 20 MG tablet (Taking) TAKE 1 TABLET BY MOUTH ONCE DAILY AS NEEDED   hydrochlorothiazide (HYDRODIURIL) 12.5 MG tablet Take 1 tablet (12.5 mg total) by mouth daily.   metoprolol succinate (TOPROL-XL) 50 MG 24 hr tablet Take 1 tablet (50 mg total) by mouth daily. Take with or immediately following a meal.      BP is chronic, well controlled, continue the above medications as prescribed.

## 2023-04-27 NOTE — Assessment & Plan Note (Signed)
A1C is at goal, 6.8. pt is checking her blood sugars twice a day, she is due for microalbumin check, we have asked for her eye exam records.

## 2023-04-27 NOTE — Progress Notes (Signed)
Established Patient Office Visit  Subjective   Patient ID: Doris Jackson, female    DOB: 1940-09-27  Age: 82 y.o. MRN: [id number]  Chief Complaint  Patient presents with   Medical Management of Chronic Issues    Pt is here for follow up today,   DM-- A1C performed in office today and is 6.8. pt needs refills on her metformin. States that she is doing well on her medication, is checking her blood sugar twice a day. States that this morning her fasting sugar was 109. States that she sometimes skips her evening metformin if her blood sugar is "good" at nighttime. States she worries about waking up with low blood sugar in the mornings. Reports she did have her eye exam this summer, will ask for records.   HTN -- BP in office performed and is well controlled. She  reports no side effects to the medications, no chest pain, SOB, dizziness or headaches. She has a BP cuff at home and is checking BP regularly, reports they are in the normal range.   Pt states that she is going every 2 weeks to her allergist for her allergy shots. States that the continues to take her allergy medication. States that she was having severe acid reflux, was placed on omeprazole and states this is working well for her acid reflux. I reviewed her medications and her HM with her, she is due for microalbumin testing today.      Current Outpatient Medications  Medication Instructions   acetaminophen (TYLENOL) 500 mg, Oral, Every 6 hours PRN   benazepril (LOTENSIN) 40 mg, Oral, Daily   benzonatate (TESSALON) 200 mg, Oral, 3 times daily PRN   Blood Glucose Monitoring Suppl (ONETOUCH VERIO REFLECT) w/Device KIT USE UP TO FOUR TIMES DAILY AS DIRECTED.   cyanocobalamin 2,000 mcg, Oral, Daily,     diclofenac Sodium (VOLTAREN) 2 g, Topical, 4 times daily   famotidine (PEPCID) 20 mg, Oral, Daily at bedtime   fexofenadine (ALLEGRA) 180 mg, Oral, Daily PRN   fluticasone (FLONASE) 50 MCG/ACT nasal spray 1 spray, Each Nare, Daily    furosemide (LASIX) 20 mg, Oral, Daily PRN   glipiZIDE (GLUCOTROL) 5 mg, Oral, Daily before breakfast   glucose blood (ONETOUCH VERIO) test strip USE 1 STRIP TO CHECK GLUCOSE 2-4 TIMES DAILY AS NEEDED . APPOINTMENT REQUIRED FOR FUTURE REFILLS   hydrochlorothiazide (HYDRODIURIL) 12.5 mg, Oral, Daily   HYDROcodone-acetaminophen (NORCO) 5-325 MG tablet 1 tablet, Oral, Daily PRN   iron polysaccharides (FERREX 150) 150 mg, Oral, Daily   Lancets 30G MISC USE TWICE DAILY AS DIRECTED FOR GLUCOSE TESTING AND MONITORING   Lancets MISC Use as directed   metFORMIN (GLUCOPHAGE-XR) 500 mg, Oral, 2 times daily   metoprolol succinate (TOPROL-XL) 50 mg, Oral, Daily, Take with or immediately following a meal.   ONE TOUCH LANCETS MISC Test twice daily.   OneTouch Delica Lancets 33G MISC Use as directed once a day   pioglitazone (ACTOS) 45 MG tablet TAKE 1 TABLET BY MOUTH ONCE DAILY   pravastatin (PRAVACHOL) 40 mg, Oral, Daily   prednisoLONE acetate (PRED FORTE) 1 % ophthalmic suspension 1 drop, Both Eyes, See admin instructions, Place 1 drop into left eye three times daily Place 1 drop into right eye once daily   Probiotic Product (PROBIOTIC-10 PO) 1 capsule, Oral, Daily   senna-docusate (SENOKOT-S) 8.6-50 MG tablet 1 tablet, Oral, Daily at bedtime   vitamin C 1,000 mg, Oral, Daily   Vitamin D 4,000 Units, Oral, Daily  Patient Active Problem List   Diagnosis Date Noted   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/2022   Anxiety state    Cervical myelopathy (HCC) 09/22/2019   S/P cervical spinal fusion 09/22/2019   GERD (gastroesophageal reflux disease) 09/14/2019   Spinal stenosis in cervical region 09/14/2019   Numbness and tingling 09/14/2019   Status post cataract extraction and insertion of intraocular lens of left eye 09/04/2019   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/24/2019   Persistent cough 06/06/2019   History of Descemet membrane endothelial keratoplasty (DMEK) 01/04/2019   Status  post cataract extraction and insertion of intraocular lens of right eye 01/04/2019   Senile nuclear sclerosis 01/02/2019   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     Objective:     BP 136/78 (BP Location: Left Arm, Patient Position: Sitting, Cuff Size: Large)   Pulse 80   Temp (!) 97.5 F (36.4 C) (Oral)   Ht 5\' 2"  (1.575 m)   Wt 237 lb 12.8 oz (107.9 kg)   SpO2 98%   BMI 43.49 kg/m    Physical Exam Vitals reviewed.  Constitutional:      Appearance: Normal appearance. She is morbidly obese.  Eyes:     Conjunctiva/sclera: Conjunctivae normal.  Pulmonary:     Effort: Pulmonary effort is normal.  Musculoskeletal:     Right lower leg: Edema (1+ BLE, at baseline for patient) present.     Left lower leg: Edema present.  Neurological:     Mental Status: She is alert and oriented to person, place, and time. Mental status is at baseline.  Psychiatric:        Mood and Affect: Mood normal.        Behavior: Behavior normal.        Thought Content: Thought content normal.      Results for orders placed or performed in visit on 04/27/23  POC HgB A1c  Result Value Ref Range   Hemoglobin A1C 6.8 (A) 4.0 - 5.6 %   HbA1c POC (<> result, manual entry)     HbA1c, POC (prediabetic range)     HbA1c, POC (controlled diabetic range)        The ASCVD Risk score (Arnett DK, et al., 2019) failed to calculate for the following reasons:   The 2019 ASCVD risk score is only valid for ages 36 to 80     Assessment & Plan:  Controlled type 2 diabetes mellitus with other diabetic kidney complication, without long-term current use of insulin (HCC) Assessment & Plan: A1C is at goal, 6.8. pt is checking her blood sugars twice a day, she is due for microalbumin check, we have asked for her eye exam records.   Orders: -     POCT glycosylated hemoglobin (Hb A1C) -     metFORMIN HCl ER; Take 1 tablet (500 mg total) by mouth 2 (two) times daily.  Dispense: 180 tablet; Refill: 1 -     Pioglitazone HCl; TAKE 1 TABLET BY MOUTH ONCE DAILY  Dispense: 90  tablet; Refill: 1 -     Microalbumin / creatinine urine ratio  Essential hypertension Assessment & Plan: Current hypertension medications:       Sig   benazepril (LOTENSIN) 40 MG tablet (Taking) Take 1 tablet by mouth once daily   furosemide (LASIX) 20 MG tablet (Taking) TAKE 1 TABLET BY MOUTH ONCE DAILY AS NEEDED   hydrochlorothiazide (HYDRODIURIL) 12.5 MG tablet Take 1 tablet (12.5 mg total) by mouth daily.   metoprolol succinate (TOPROL-XL) 50 MG 24 hr tablet Take 1 tablet (50 mg total) by mouth daily. Take with or immediately following a meal.      BP is chronic, well controlled, continue the above medications as prescribed.   Orders: -     hydroCHLOROthiazide; Take 1 tablet (12.5 mg total) by mouth daily.  Dispense: 90 tablet; Refill: 1 -     Metoprolol Succinate ER; Take 1 tablet (50 mg total) by mouth daily. Take with or immediately following a meal.  Dispense: 90 tablet; Refill: 1  Dyslipidemia Assessment & Plan: On pravastatin, doing well, I have reviewed her last lipid panel with her, continue this as prescribed, refills setn  Orders: -     Pravastatin Sodium; Take 1 tablet (40 mg total) by mouth daily.  Dispense: 90 tablet; Refill: 3     Return in about 6 months (around 10/26/2023) for DM-- please come fasting for your annual bloodwork.    Karie Georges, MD

## 2023-04-28 DIAGNOSIS — J3089 Other allergic rhinitis: Secondary | ICD-10-CM | POA: Diagnosis not present

## 2023-04-28 DIAGNOSIS — J301 Allergic rhinitis due to pollen: Secondary | ICD-10-CM | POA: Diagnosis not present

## 2023-04-28 DIAGNOSIS — J3081 Allergic rhinitis due to animal (cat) (dog) hair and dander: Secondary | ICD-10-CM | POA: Diagnosis not present

## 2023-04-28 LAB — MICROALBUMIN / CREATININE URINE RATIO
Creatinine,U: 108.1 mg/dL
Microalb Creat Ratio: 0.9 mg/g (ref 0.0–30.0)
Microalb, Ur: 1 mg/dL (ref 0.0–1.9)

## 2023-05-12 DIAGNOSIS — J3089 Other allergic rhinitis: Secondary | ICD-10-CM | POA: Diagnosis not present

## 2023-05-12 DIAGNOSIS — J301 Allergic rhinitis due to pollen: Secondary | ICD-10-CM | POA: Diagnosis not present

## 2023-05-12 DIAGNOSIS — J3081 Allergic rhinitis due to animal (cat) (dog) hair and dander: Secondary | ICD-10-CM | POA: Diagnosis not present

## 2023-05-20 DIAGNOSIS — Z1231 Encounter for screening mammogram for malignant neoplasm of breast: Secondary | ICD-10-CM | POA: Diagnosis not present

## 2023-05-20 LAB — HM MAMMOGRAPHY

## 2023-05-25 DIAGNOSIS — J301 Allergic rhinitis due to pollen: Secondary | ICD-10-CM | POA: Diagnosis not present

## 2023-05-25 DIAGNOSIS — J3089 Other allergic rhinitis: Secondary | ICD-10-CM | POA: Diagnosis not present

## 2023-05-25 DIAGNOSIS — J3081 Allergic rhinitis due to animal (cat) (dog) hair and dander: Secondary | ICD-10-CM | POA: Diagnosis not present

## 2023-06-02 ENCOUNTER — Other Ambulatory Visit: Payer: Self-pay | Admitting: Family Medicine

## 2023-06-08 DIAGNOSIS — J3081 Allergic rhinitis due to animal (cat) (dog) hair and dander: Secondary | ICD-10-CM | POA: Diagnosis not present

## 2023-06-08 DIAGNOSIS — J3089 Other allergic rhinitis: Secondary | ICD-10-CM | POA: Diagnosis not present

## 2023-06-08 DIAGNOSIS — J301 Allergic rhinitis due to pollen: Secondary | ICD-10-CM | POA: Diagnosis not present

## 2023-06-22 DIAGNOSIS — J3089 Other allergic rhinitis: Secondary | ICD-10-CM | POA: Diagnosis not present

## 2023-06-22 DIAGNOSIS — J301 Allergic rhinitis due to pollen: Secondary | ICD-10-CM | POA: Diagnosis not present

## 2023-07-06 DIAGNOSIS — J301 Allergic rhinitis due to pollen: Secondary | ICD-10-CM | POA: Diagnosis not present

## 2023-07-06 DIAGNOSIS — J3089 Other allergic rhinitis: Secondary | ICD-10-CM | POA: Diagnosis not present

## 2023-07-06 DIAGNOSIS — J3081 Allergic rhinitis due to animal (cat) (dog) hair and dander: Secondary | ICD-10-CM | POA: Diagnosis not present

## 2023-07-07 ENCOUNTER — Telehealth: Payer: Self-pay

## 2023-07-07 NOTE — Telephone Encounter (Signed)
 Copied from CRM [id number]. Topic: Referral - Status >> Jul 07, 2023  2:05 PM Leila BROCKS wrote: Reason for CRM: Patient [phone] would like to get a referral to see an allergist Dr. Dallis Barter [phone], because of acid reflux, coughing and patient thinks she needs an inhaler. Informed patient an appointment might be needed with Dr. Ozell for a referral. Patient wants the message sent to Dr. Heddie nurse. Please advise and call back.

## 2023-07-07 NOTE — Telephone Encounter (Signed)
 Spoke with the patient and informed her a visit is needed for evaluation prior to placing a referral to a specialist.  Patient stated she has had a cough, she feels is due to reflux, allergies and has been taking an allergy pill daily and reflux Rx with no relief.  Stated she was receiving allergy shots by another office and does not feel this has helped also.  Offered appt on Friday with PCP at 8am, patient stated this was too early and declined due to pending inclement weather.  Appt scheduled with Dr Jordan on Friday at 1pm.

## 2023-07-08 ENCOUNTER — Other Ambulatory Visit: Payer: Self-pay | Admitting: Family Medicine

## 2023-07-09 ENCOUNTER — Other Ambulatory Visit: Payer: Self-pay | Admitting: Family Medicine

## 2023-07-09 DIAGNOSIS — M48061 Spinal stenosis, lumbar region without neurogenic claudication: Secondary | ICD-10-CM

## 2023-07-10 ENCOUNTER — Encounter: Payer: Self-pay | Admitting: Family Medicine

## 2023-07-10 ENCOUNTER — Ambulatory Visit (INDEPENDENT_AMBULATORY_CARE_PROVIDER_SITE_OTHER): Payer: Medicare HMO | Admitting: Family Medicine

## 2023-07-10 VITALS — BP 130/80 | HR 76 | Resp 16 | Ht 62.0 in | Wt 241.4 lb

## 2023-07-10 DIAGNOSIS — J309 Allergic rhinitis, unspecified: Secondary | ICD-10-CM | POA: Diagnosis not present

## 2023-07-10 DIAGNOSIS — I10 Essential (primary) hypertension: Secondary | ICD-10-CM

## 2023-07-10 DIAGNOSIS — R053 Chronic cough: Secondary | ICD-10-CM

## 2023-07-10 DIAGNOSIS — K219 Gastro-esophageal reflux disease without esophagitis: Secondary | ICD-10-CM | POA: Diagnosis not present

## 2023-07-10 MED ORDER — OLMESARTAN MEDOXOMIL 40 MG PO TABS: 40.0000 mg | ORAL_TABLET | Freq: Every day | ORAL | 0 refills | Status: DC

## 2023-07-10 MED ORDER — PANTOPRAZOLE SODIUM 40 MG PO TBEC: 40.0000 mg | DELAYED_RELEASE_TABLET | Freq: Every day | ORAL | 0 refills | Status: DC

## 2023-07-10 MED ORDER — FLUTICASONE PROPIONATE 50 MCG/ACT NA SUSP: 1.0000 | Freq: Every day | NASAL | 1 refills | Status: AC

## 2023-07-10 MED ORDER — HYDROCODONE-ACETAMINOPHEN 5-325 MG PO TABS: 1.0000 | ORAL_TABLET | Freq: Every day | ORAL | 0 refills | Status: DC | PRN

## 2023-07-10 NOTE — Progress Notes (Signed)
 ACUTE VISIT Chief Complaint  Patient presents with   Cough    X3-4 months, wanting to see specialist    HPI: Doris Jackson is a 83 y.o. female with a PMHx significant for HTN, GERD, DM II, cervical myelopathy, OA, CKD 3b, anxiety, and low back pain, among others, who is here today complaining of cough.   Patient complains of cough for the last 3-4 months.  She says the cough is interfering with her sleep, and needs to take Mucinex , cough drops,and benzonatate  200 mg 3x daily.  Cough This is a recurrent problem. The problem has been unchanged. The problem occurs every few hours. The cough is Productive of sputum. Associated symptoms include heartburn, nasal congestion, postnasal drip and rhinorrhea. Pertinent negatives include no chest pain, chills, ear congestion, ear pain, fever, headaches, hemoptysis, myalgias, rash, sore throat, shortness of breath, sweats, weight loss or wheezing. Nothing aggravates the symptoms. She has tried OTC cough suppressant for the symptoms. The treatment provided no relief. Her past medical history is significant for environmental allergies.   She denies orthopnea, PND, wheezing SOB, or worsening edema. She would like a referral to see a pulmonology Kristan Graven PA-C).  No hx of smoking.   Seasonal allergies:  She has an allergy shot that she receives every other week. She says it doesn't seem to be helping very much. She also occasionally takes Allegra.  Nasal congestion and rhinorrhea worse in the morning. Occasional minimal nose bleed.  GERD:  She takes omeprazole  20 mg in the morning. She also takes two teaspoons of OTC gaviscon  at night.  Denies abdominal pain, nausea, vomiting, changes in bowel habits, blood in stool or melena. In the past took Pepcid , caused drowsiness.  Hypertension:  Medications: Currently on benazepril  40 mg daily, metoprolol  succinate 50 mg daily, and hydrochlorothiazide  12.5 mg daily.  Side effects: none Negative for  unusual or severe headache, visual changes, exertional chest pain, focal weakness.  Lab Results  Component Value Date   CREATININE 1.07 11/13/2022   BUN 18 11/13/2022   NA 140 11/13/2022   K 3.4 (L) 11/13/2022   CL 103 11/13/2022   CO2 25 11/13/2022   Review of Systems  Constitutional:  Negative for chills, fever and weight loss.  HENT:  Positive for postnasal drip and rhinorrhea. Negative for ear pain and sore throat.   Respiratory:  Positive for cough. Negative for hemoptysis, shortness of breath and wheezing.   Cardiovascular:  Negative for chest pain.  Gastrointestinal:  Positive for heartburn.  Genitourinary:  Negative for decreased urine volume, dysuria and hematuria.  Musculoskeletal:  Positive for arthralgias and gait problem. Negative for myalgias.  Skin:  Negative for rash.  Allergic/Immunologic: Positive for environmental allergies.  Neurological:  Negative for syncope and headaches.  Psychiatric/Behavioral:  Negative for confusion and hallucinations.   See other pertinent positives and negatives in HPI.  Current Outpatient Medications on File Prior to Visit  Medication Sig Dispense Refill   acetaminophen  (TYLENOL ) 500 MG tablet Take 500 mg by mouth every 6 (six) hours as needed.     Ascorbic Acid  (VITAMIN C) 1000 MG tablet Take 1,000 mg by mouth daily.     benzonatate  (TESSALON ) 200 MG capsule Take 200 mg by mouth 3 (three) times daily as needed for cough.     Blood Glucose Monitoring Suppl (ONETOUCH VERIO REFLECT) w/Device KIT USE UP TO FOUR TIMES DAILY AS DIRECTED. 1 kit 1   Cholecalciferol  (VITAMIN D ) 2000 UNITS CAPS Take 4,000 Units by  mouth daily.      cyanocobalamin  2000 MCG tablet Take 2,000 mcg by mouth daily.     diclofenac  Sodium (VOLTAREN ) 1 % GEL Apply 2 g topically 4 (four) times daily. 150 g 5   famotidine  (PEPCID ) 20 MG tablet Take 1 tablet (20 mg total) by mouth at bedtime. 90 tablet 1   fexofenadine (ALLEGRA) 180 MG tablet Take 180 mg by mouth daily as  needed for allergies.     fluticasone  (FLONASE ) 50 MCG/ACT nasal spray Place 1 spray into both nostrils daily.     furosemide  (LASIX ) 20 MG tablet TAKE 1 TABLET BY MOUTH ONCE DAILY AS NEEDED 90 tablet 0   glipiZIDE  (GLUCOTROL ) 5 MG tablet Take 1 tablet (5 mg total) by mouth daily before breakfast. 90 tablet 3   glucose blood (ONETOUCH VERIO) test strip USE 1 STRIP TO CHECK GLUCOSE 2-4 TIMES DAILY AS NEEDED 200 each 0   hydrochlorothiazide  (HYDRODIURIL ) 12.5 MG tablet Take 1 tablet (12.5 mg total) by mouth daily. 90 tablet 1   HYDROcodone -acetaminophen  (NORCO) 5-325 MG tablet Take 1 tablet by mouth daily as needed for moderate pain (pain score 4-6). 30 tablet 0   iron  polysaccharides (FERREX 150) 150 MG capsule Take 1 capsule (150 mg total) by mouth daily. 90 capsule 1   Lancets 30G MISC USE TWICE DAILY AS DIRECTED FOR GLUCOSE TESTING AND MONITORING 100 each 4   Lancets MISC Use as directed 100 each 3   metFORMIN  (GLUCOPHAGE -XR) 500 MG 24 hr tablet Take 1 tablet (500 mg total) by mouth 2 (two) times daily. 180 tablet 1   metoprolol  succinate (TOPROL -XL) 50 MG 24 hr tablet Take 1 tablet (50 mg total) by mouth daily. Take with or immediately following a meal. 90 tablet 1   ONE TOUCH LANCETS MISC Test twice daily. 200 each 5   OneTouch Delica Lancets 33G MISC Use as directed once a day 100 each 1   pioglitazone  (ACTOS ) 45 MG tablet TAKE 1 TABLET BY MOUTH ONCE DAILY 90 tablet 1   pravastatin  (PRAVACHOL ) 40 MG tablet Take 1 tablet (40 mg total) by mouth daily. 90 tablet 3   prednisoLONE  acetate (PRED FORTE ) 1 % ophthalmic suspension Place 1 drop into both eyes See admin instructions. Place 1 drop into left eye three times daily Place 1 drop into right eye once daily     Probiotic Product (PROBIOTIC-10 PO) Take 1 capsule by mouth daily.     senna-docusate (SENOKOT-S) 8.6-50 MG tablet Take 1 tablet by mouth at bedtime. 30 tablet 0   No current facility-administered medications on file prior to visit.     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   HYPERLIPIDEMIA 06/17/2010   HYPERTENSION 06/17/2010   Leg swelling    LOW BACK PAIN 06/17/2010   OSTEOARTHRITIS 06/17/2010   OSTEOPENIA 06/17/2010   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   Weakness    Allergies  Allergen Reactions   Dust Mite Extract Itching and Cough   Latex Itching    Powder  gloves    Social History   Socioeconomic History   Marital status: Married    Spouse name: Not on file   Number of children: 3   Years of education: college   Highest education level: Not on file  Occupational History   Not on file  Tobacco Use   Smoking status: Never   Smokeless tobacco: Never  Vaping Use   Vaping status: Never Used  Substance and Sexual Activity   Alcohol use: No   Drug use: No   Sexual activity: Never    Birth control/protection: Post-menopausal, Surgical    Comment: Tubal Ligation  Other Topics Concern   Not on file  Social History Narrative   Retired agricultural engineer   Social Drivers of Health   Financial Resource Strain: Low Risk  (11/26/2021)   Overall Financial Resource Strain (CARDIA)    Difficulty of Paying Living Expenses: Not hard at all  Food Insecurity: No Food Insecurity (11/26/2021)   Hunger Vital Sign    Worried About Running Out of Food in the Last Year: Never true    Ran Out of Food in the Last Year: Never true  Transportation Needs: No Transportation Needs (11/26/2021)   PRAPARE - Administrator, Civil Service (Medical): No    Lack  of Transportation (Non-Medical): No  Physical Activity: Insufficiently Active (11/26/2021)   Exercise Vital Sign    Days of Exercise per Week: 3 days    Minutes of Exercise per Session: 20 min  Stress: No Stress Concern Present (11/26/2021)   [employer] of Occupational Health - Occupational Stress Questionnaire    Feeling of Stress : Not at all  Social Connections: Unknown (11/12/2021)   Received from [facility]   Social Network    Social Network: Not on file   Vitals:   07/10/23 1246  BP: 130/80  Pulse: 76  Resp: 16  SpO2: 98%   Body mass index is 44.15 kg/m.  Physical Exam Vitals and nursing note reviewed.  Constitutional:      General: She is not in acute distress.    Appearance: She is well-developed.  HENT:     Head: Normocephalic and atraumatic.     Nose: Congestion and rhinorrhea present.     Right Turbinates: Enlarged.     Left Turbinates: Enlarged.     Mouth/Throat:     Mouth: Mucous membranes are moist.     Pharynx: Oropharynx is clear. Uvula midline.  Eyes:     Conjunctiva/sclera: Conjunctivae normal.  Cardiovascular:     Rate and Rhythm: Normal rate and regular rhythm.     Heart sounds: No murmur heard.    Comments: 1-2+ pitting bilateral LE swelling.  Pulmonary:     Effort: Pulmonary effort is normal. No respiratory distress.     Breath sounds: Normal breath sounds.  Abdominal:     Palpations: Abdomen is soft.     Tenderness: There is no abdominal tenderness.  Musculoskeletal:     Right lower leg: 2+ Edema present.     Left lower leg: 2+ Edema present.  Lymphadenopathy:     Cervical: No cervical adenopathy.  Skin:    General: Skin is warm.     Findings: No erythema or rash.  Neurological:     General: No focal deficit present.     Mental Status: She is alert and oriented to person, place, and time.     Cranial Nerves: No cranial nerve deficit.     Comments:  Unstable gait, assisted with a walker.  Psychiatric:        Mood  and Affect: Mood and affect normal.   ASSESSMENT AND PLAN:  Doris Jackson was seen today for cough.   Chronic cough Problem has been going on for 3-4 months. We discussed possible etiologies, a few of her co morbilities can be contributing factors as well as medications. Lung auscultation negative. Reports using an inhaler in the past, ? COPD, asthma. CXR ordered, radiology service not available at this time, she will come back next week. Benazepril  stopped and GERD treatment changed.  -     Ambulatory referral to Pulmonology -     DG Chest 2 View; Future  Gastroesophageal reflux disease, unspecified whether esophagitis present Problem is not well controlled. Recommend stopping Omeprazole  and start Pantoprazole  40 mg 30 min before breakfast. GERD precautions also recommended.  -     Pantoprazole  Sodium; Take 1 tablet (40 mg total) by mouth daily before breakfast.  Dispense: 90 tablet; Refill: 0  Allergic rhinitis, unspecified seasonality, unspecified trigger Problem is not well controlled. Recommend resuming Flonase  nasal spray 1 puff at night for 10-14 days then prn. Nasal irrigations as needed. Currently on immunotherapy.  Essential hypertension BP adequately controlled. Benazepril  could be contributing to her cough, so discontinued. Olmesartan  40 mg daily started today. Continue Metoprolol  succinate and hydrochlorothiazide  same dose as well as low salt diet. Recommend monitoring BP at home. F/U in 6 weeks.  -     Olmesartan  Medoxomil; Take 1 tablet (40 mg total) by mouth daily.  Dispense: 90 tablet; Refill: 0 -     Basic metabolic panel; Future  Return in about 6 weeks (around 08/21/2023) for HTN and cough with PCP.  I, Leonce PARAS Wierda, acting as a scribe for Lynora Dymond, MD., have documented all relevant documentation on the behalf of Doris Perris, MD, as directed by  Tyrianna Lightle, MD while in the presence of Augustino Savastano, MD.   I, Braidon Chermak, MD, have reviewed all  documentation for this visit. The documentation on 07/10/23 for the exam, diagnosis, procedures, and orders are all accurate and complete.  Doris Jackson G. Zoeie Ritter, MD  [facility]. [place] office.

## 2023-07-10 NOTE — Telephone Encounter (Signed)
 Patient has appt scheduled in office today 1/10. Medication last ordered 02/20/23

## 2023-07-10 NOTE — Patient Instructions (Addendum)
 A few things to remember from today's visit:  Gastroesophageal reflux disease, unspecified whether esophagitis present - Plan: pantoprazole  (PROTONIX ) 40 MG tablet  Allergic rhinitis, unspecified seasonality, unspecified trigger  Chronic cough - Plan: Ambulatory referral to Pulmonology, DG Chest 2 View  Essential hypertension - Plan: olmesartan  (BENICAR ) 40 MG tablet, Basic metabolic panel  Today Benazepril  and Omeprazole  discontinued. Start Olmesartan  40 mg daily for blood pressure and Pantoprazole  40 mg 30 min before breakfast. No changes in rest. Monitor blood pressure. Pulmonology referral placed as requested. Nasal saline irrigations. Flonase  nasal spray at night as needed.  If you need refills for medications you take chronically, please call your pharmacy. Do not use My Chart to request refills or for acute issues that need immediate attention. If you send a my chart message, it may take a few days to be addressed, specially if I am not in the office.  Please be sure medication list is accurate. If a new problem present, please set up appointment sooner than planned today.

## 2023-07-20 ENCOUNTER — Telehealth: Payer: Self-pay | Admitting: Family Medicine

## 2023-07-20 NOTE — Telephone Encounter (Signed)
Copied from CRM [id number]. Topic: Clinical - Medication Question >> Jul 20, 2023 10:25 AM Clayton Bibles wrote: Reason for CRM: Bellami was put on pantoprazole (PROTONIX) 40 MG tablet and started getting dizzy. She started breaking tablets in half and she has no dizzy spells. She wants pantoprazole changed to 20 MG. Please give her a call.

## 2023-07-20 NOTE — Telephone Encounter (Signed)
Patient informed of the message below. Message sent to PCP.

## 2023-07-21 ENCOUNTER — Ambulatory Visit (INDEPENDENT_AMBULATORY_CARE_PROVIDER_SITE_OTHER): Payer: Medicare HMO

## 2023-07-21 ENCOUNTER — Telehealth: Payer: Self-pay | Admitting: Family Medicine

## 2023-07-21 ENCOUNTER — Other Ambulatory Visit (INDEPENDENT_AMBULATORY_CARE_PROVIDER_SITE_OTHER): Payer: Medicare HMO

## 2023-07-21 DIAGNOSIS — J3089 Other allergic rhinitis: Secondary | ICD-10-CM | POA: Diagnosis not present

## 2023-07-21 DIAGNOSIS — I10 Essential (primary) hypertension: Secondary | ICD-10-CM

## 2023-07-21 DIAGNOSIS — R053 Chronic cough: Secondary | ICD-10-CM

## 2023-07-21 DIAGNOSIS — J3081 Allergic rhinitis due to animal (cat) (dog) hair and dander: Secondary | ICD-10-CM | POA: Diagnosis not present

## 2023-07-21 DIAGNOSIS — I771 Stricture of artery: Secondary | ICD-10-CM | POA: Diagnosis not present

## 2023-07-21 DIAGNOSIS — I700 Atherosclerosis of aorta: Secondary | ICD-10-CM | POA: Diagnosis not present

## 2023-07-21 DIAGNOSIS — R918 Other nonspecific abnormal finding of lung field: Secondary | ICD-10-CM | POA: Diagnosis not present

## 2023-07-21 DIAGNOSIS — J301 Allergic rhinitis due to pollen: Secondary | ICD-10-CM | POA: Diagnosis not present

## 2023-07-21 DIAGNOSIS — R058 Other specified cough: Secondary | ICD-10-CM | POA: Diagnosis not present

## 2023-07-21 LAB — BASIC METABOLIC PANEL
BUN: 33 mg/dL — ABNORMAL HIGH (ref 6–23)
CO2: 27 meq/L (ref 19–32)
Calcium: 9.1 mg/dL (ref 8.4–10.5)
Chloride: 103 meq/L (ref 96–112)
Creatinine, Ser: 1.24 mg/dL — ABNORMAL HIGH (ref 0.40–1.20)
GFR: 40.82 mL/min — ABNORMAL LOW (ref >60.00)
Glucose, Bld: 158 mg/dL — ABNORMAL HIGH (ref 70–99)
Potassium: 3.9 meq/L (ref 3.5–5.1)
Sodium: 139 meq/L (ref 135–145)

## 2023-07-21 NOTE — Telephone Encounter (Signed)
Patient informed of the message below and an appt was scheduled on 1/24 with PCP.

## 2023-07-21 NOTE — Telephone Encounter (Signed)
Pt states she was put on Pantoprazole 40 MG, however it is making her dizzy and she believes the dosage is too high. She also states her blood pressure is still running high and feels as though there needs to be an adjustment to her medication. I offered to schedule an appointment, however pt would like to speak to the nurse first. Please advise at: [phone].

## 2023-07-21 NOTE — Telephone Encounter (Signed)
It's ok to reduce the pantoprazole to 20 mg (1/2 tablet daily). Also if her BP is high at home then we can increase the hydrochlorothiazide to 25 mg daily (she is on 12.5 mg currently.) so she would take 2 tablets of the Hydrochlorothiazide daily.   I would like to see her in the office to check her BP -- please have her schedule an appointment.

## 2023-07-24 ENCOUNTER — Ambulatory Visit (INDEPENDENT_AMBULATORY_CARE_PROVIDER_SITE_OTHER): Payer: Medicare HMO | Admitting: Family Medicine

## 2023-07-24 VITALS — BP 148/72 | HR 81 | Temp 97.6°F | Ht 62.0 in | Wt 239.6 lb

## 2023-07-24 DIAGNOSIS — K219 Gastro-esophageal reflux disease without esophagitis: Secondary | ICD-10-CM

## 2023-07-24 DIAGNOSIS — J811 Chronic pulmonary edema: Secondary | ICD-10-CM | POA: Diagnosis not present

## 2023-07-24 DIAGNOSIS — I10 Essential (primary) hypertension: Secondary | ICD-10-CM | POA: Diagnosis not present

## 2023-07-24 LAB — BRAIN NATRIURETIC PEPTIDE: Pro B Natriuretic peptide (BNP): 33 pg/mL (ref 0.0–100.0)

## 2023-07-24 MED ORDER — AMLODIPINE BESYLATE 5 MG PO TABS: 5.0000 mg | ORAL_TABLET | Freq: Every day | ORAL | 1 refills | Status: DC

## 2023-07-24 MED ORDER — PANTOPRAZOLE SODIUM 20 MG PO TBEC: 20.0000 mg | DELAYED_RELEASE_TABLET | Freq: Every day | ORAL | 1 refills | Status: DC

## 2023-07-24 NOTE — Assessment & Plan Note (Signed)
Chronic, uncontrolled, will add back the amlodipine 5 mg daily, I went over her regimen with her and gave her a written copy of her medication list. She will see me in February for follow up Current hypertension medications:       Sig   amLODipine (NORVASC) 5 MG tablet (Taking) Take 1 tablet (5 mg total) by mouth daily.   furosemide (LASIX) 20 MG tablet (Taking As Needed) TAKE 1 TABLET BY MOUTH ONCE DAILY AS NEEDED   hydrochlorothiazide (HYDRODIURIL) 12.5 MG tablet (Taking) Take 1 tablet (12.5 mg total) by mouth daily.   metoprolol succinate (TOPROL-XL) 50 MG 24 hr tablet (Taking) Take 1 tablet (50 mg total) by mouth daily. Take with or immediately following a meal.   olmesartan (BENICAR) 40 MG tablet (Taking) Take 1 tablet (40 mg total) by mouth daily.

## 2023-07-24 NOTE — Assessment & Plan Note (Signed)
Pt is on 20 mg pantoprazole, is asking for a new rx, script sent, coughing is better somewhat since starting the pantoprazole

## 2023-07-24 NOTE — Progress Notes (Signed)
Established Patient Office Visit  Subjective   Patient ID: Doris Jackson, female    DOB: 11-20-1940  Age: 83 y.o. MRN: [id number]  Chief Complaint  Patient presents with   Hypertension    Patient states BP readings have been elevated at home, systolic 149-150+    Pt is here for follow up. She saw Dr. [place] a couple weeks ago reporting a chronic cough -- she had a chest film done and PPI was changed to 40 mg  daily of pantoprazole. Pt reports some improvement with the flonase and allegra. Is also taking mucinex 600 mg BID. States that the regurgitations from her stomach is better, coughing is slightly improved since switching from benazepril to olmesartan. I reviewed her CXR with her, she already has a pulmonary referral ordered.   Diffuse symmetric increase of the interstitial markings, which may represent pulmonary edema or atypical/viral pneumonia. Alternatively, interstitial pneumonitis may have a similar appearance.   HTN-- pt reports her BP is high at home, her benazepril was switched to olmesartan due to the cough, BP is also elevated in office today, we discussed adding back the amlodipine and she is agreeable     Current Outpatient Medications  Medication Instructions   acetaminophen (TYLENOL) 500 mg, Every 6 hours PRN   amLODipine (NORVASC) 5 mg, Oral, Daily   benzonatate (TESSALON) 200 mg, 3 times daily PRN   Blood Glucose Monitoring Suppl (ONETOUCH VERIO REFLECT) w/Device KIT USE UP TO FOUR TIMES DAILY AS DIRECTED.   cyanocobalamin 2,000 mcg, Daily   diclofenac Sodium (VOLTAREN) 2 g, Topical, 4 times daily   famotidine (PEPCID) 20 mg, Oral, Daily at bedtime   fexofenadine (ALLEGRA) 180 mg, Daily PRN   fluticasone (FLONASE) 50 MCG/ACT nasal spray 1 spray, Each Nare, Daily   furosemide (LASIX) 20 mg, Oral, Daily PRN   glipiZIDE (GLUCOTROL) 5 mg, Oral, Daily before breakfast   glucose blood (ONETOUCH VERIO) test strip USE 1 STRIP TO CHECK GLUCOSE 2-4 TIMES DAILY AS  NEEDED    hydrochlorothiazide (HYDRODIURIL) 12.5 mg, Oral, Daily   HYDROcodone-acetaminophen (NORCO) 5-325 MG tablet 1 tablet, Oral, Daily PRN   iron polysaccharides (FERREX 150) 150 mg, Oral, Daily   Lancets 30G MISC USE TWICE DAILY AS DIRECTED FOR GLUCOSE TESTING AND MONITORING   Lancets MISC Use as directed   metFORMIN (GLUCOPHAGE-XR) 500 mg, Oral, 2 times daily   metoprolol succinate (TOPROL-XL) 50 mg, Oral, Daily, Take with or immediately following a meal.   olmesartan (BENICAR) 40 mg, Oral, Daily   ONE TOUCH LANCETS MISC Test twice daily.   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/2022   Anxiety state    Cervical myelopathy (HCC) 09/22/2019   S/P cervical spinal fusion 09/22/2019   GERD (gastroesophageal reflux disease) 09/14/2019   Spinal stenosis in cervical region 09/14/2019   Numbness and tingling 09/14/2019   Status post cataract extraction  and insertion of intraocular lens of left eye 09/04/2019   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/24/2019   Persistent cough 06/06/2019   History of Descemet membrane endothelial keratoplasty (DMEK) 01/04/2019   Status post cataract extraction and insertion of intraocular lens of right eye 01/04/2019   Senile nuclear sclerosis 01/02/2019   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     Objective:     BP (!) 148/72 (BP Location: Left Arm, Patient Position: Sitting, Cuff Size: Large)   Pulse 81   Temp 97.6 F (36.4 C) (Oral)   Ht 5\' 2"  (1.575 m)   Wt 239 lb 9.6 oz (108.7 kg)   SpO2 99%   BMI 43.82 kg/m    Physical Exam Vitals reviewed.  Constitutional:      Appearance: Normal appearance. She is morbidly obese.  Cardiovascular:     Rate and Rhythm: Normal rate and regular rhythm.     Heart sounds: Normal heart sounds. No murmur heard. Pulmonary:     Effort: Pulmonary effort is normal.     Breath sounds: Normal breath sounds. No wheezing.  Neurological:     Mental Status: She is alert and oriented to person, place, and time. Mental status is at baseline.  Psychiatric:        Mood and Affect: Mood normal.        Behavior: Behavior normal.      No results found for any visits on 07/24/23.    The ASCVD Risk score (Arnett DK, et al., 2019) failed to calculate for the following reasons:   The 2019 ASCVD risk score is only valid for ages 45 to 30    Assessment & Plan:  Chronic pulmonary edema Questionable-- CXR shows increased interstitial markings, could be edema vs atypical pnuemonia vs pneumonitis-- she has a referral to pulmonary already, will check BNP to rule out congestive heart failure  -     Brain natriuretic peptide  Essential hypertension Assessment & Plan: Chronic, uncontrolled, will add back the amlodipine 5 mg daily, I went over her regimen with her and gave her a written copy of her medication list. She will see me in February for follow up Current  hypertension medications:       Sig   amLODipine (NORVASC) 5 MG tablet (Taking) Take 1 tablet (5 mg total) by mouth daily.   furosemide (LASIX) 20 MG tablet (Taking As Needed) TAKE 1 TABLET BY MOUTH ONCE DAILY AS NEEDED   hydrochlorothiazide (HYDRODIURIL) 12.5 MG tablet (Taking) Take 1 tablet (12.5 mg total) by mouth daily.   metoprolol succinate (TOPROL-XL) 50 MG 24 hr tablet (Taking) Take 1 tablet (50 mg total) by mouth daily. Take with or immediately following a meal.   olmesartan (BENICAR) 40 MG tablet (Taking) Take 1 tablet (40 mg total) by mouth daily.        Orders: -     amLODIPine Besylate; Take 1 tablet (5 mg total) by mouth daily.  Dispense: 90 tablet; Refill: 1  Gastroesophageal reflux disease, unspecified whether esophagitis  present Assessment & Plan: Pt is on 20 mg pantoprazole, is asking for a new rx, script sent, coughing is better somewhat since starting the pantoprazole  Orders: -     Pantoprazole Sodium; Take 1 tablet (20 mg total) by mouth daily.  Dispense: 90 tablet; Refill: 1     No follow-ups on file.    Karie Georges, MD

## 2023-08-04 ENCOUNTER — Ambulatory Visit (INDEPENDENT_AMBULATORY_CARE_PROVIDER_SITE_OTHER): Payer: Medicare HMO | Admitting: Family Medicine

## 2023-08-04 ENCOUNTER — Other Ambulatory Visit: Payer: Self-pay

## 2023-08-04 ENCOUNTER — Encounter: Payer: Self-pay | Admitting: Family Medicine

## 2023-08-04 ENCOUNTER — Other Ambulatory Visit: Payer: Self-pay | Admitting: Family Medicine

## 2023-08-04 ENCOUNTER — Encounter (HOSPITAL_BASED_OUTPATIENT_CLINIC_OR_DEPARTMENT_OTHER): Payer: Self-pay

## 2023-08-04 ENCOUNTER — Emergency Department (HOSPITAL_BASED_OUTPATIENT_CLINIC_OR_DEPARTMENT_OTHER)
Admission: EM | Admit: 2023-08-04 | Discharge: 2023-08-04 | Discharge status: Against Medical Advice | Payer: Medicare HMO | Attending: Emergency Medicine | Admitting: Emergency Medicine

## 2023-08-04 VITALS — BP 137/84 | HR 89 | Temp 97.7°F | Ht 62.0 in | Wt 241.4 lb

## 2023-08-04 DIAGNOSIS — D509 Iron deficiency anemia, unspecified: Secondary | ICD-10-CM | POA: Diagnosis not present

## 2023-08-04 DIAGNOSIS — R410 Disorientation, unspecified: Secondary | ICD-10-CM | POA: Insufficient documentation

## 2023-08-04 DIAGNOSIS — R42 Dizziness and giddiness: Secondary | ICD-10-CM | POA: Diagnosis not present

## 2023-08-04 DIAGNOSIS — Z5321 Procedure and treatment not carried out due to patient leaving prior to being seen by health care provider: Secondary | ICD-10-CM | POA: Diagnosis not present

## 2023-08-04 DIAGNOSIS — J3089 Other allergic rhinitis: Secondary | ICD-10-CM | POA: Diagnosis not present

## 2023-08-04 DIAGNOSIS — K219 Gastro-esophageal reflux disease without esophagitis: Secondary | ICD-10-CM | POA: Diagnosis not present

## 2023-08-04 DIAGNOSIS — I10 Essential (primary) hypertension: Secondary | ICD-10-CM | POA: Insufficient documentation

## 2023-08-04 DIAGNOSIS — J301 Allergic rhinitis due to pollen: Secondary | ICD-10-CM | POA: Diagnosis not present

## 2023-08-04 DIAGNOSIS — J3081 Allergic rhinitis due to animal (cat) (dog) hair and dander: Secondary | ICD-10-CM | POA: Diagnosis not present

## 2023-08-04 LAB — CBC WITH DIFFERENTIAL/PLATELET
Abs Immature Granulocytes: 0.04 10*3/uL (ref 0.00–0.07)
Basophils Absolute: 0 10*3/uL (ref 0.0–0.1)
Basophils Relative: 1 %
Eosinophils Absolute: 0.2 10*3/uL (ref 0.0–0.5)
Eosinophils Relative: 3 %
HCT: 29.1 % — ABNORMAL LOW (ref 36.0–46.0)
Hemoglobin: 9.9 g/dL — ABNORMAL LOW (ref 12.0–15.0)
Immature Granulocytes: 1 %
Lymphocytes Relative: 24 %
Lymphs Abs: 1.5 10*3/uL (ref 0.7–4.0)
MCH: 27 pg (ref 26.0–34.0)
MCHC: 34 g/dL (ref 30.0–36.0)
MCV: 79.3 fL — ABNORMAL LOW (ref 80.0–100.0)
Monocytes Absolute: 0.7 10*3/uL (ref 0.1–1.0)
Monocytes Relative: 11 %
Neutro Abs: 3.9 10*3/uL (ref 1.7–7.7)
Neutrophils Relative %: 60 %
Platelets: 126 10*3/uL — ABNORMAL LOW (ref 150–400)
RBC: 3.67 MIL/uL — ABNORMAL LOW (ref 3.87–5.11)
RDW: 16.3 % — ABNORMAL HIGH (ref 11.5–15.5)
WBC: 6.3 10*3/uL (ref 4.0–10.5)
nRBC: 0 % (ref 0.0–0.2)

## 2023-08-04 LAB — COMPREHENSIVE METABOLIC PANEL
ALT: 11 U/L (ref 0–44)
AST: 12 U/L — ABNORMAL LOW (ref 15–41)
Albumin: 4.1 g/dL (ref 3.5–5.0)
Alkaline Phosphatase: 101 U/L (ref 38–126)
Anion gap: 10 (ref 5–15)
BUN: 29 mg/dL — ABNORMAL HIGH (ref 8–23)
CO2: 26 mmol/L (ref 22–32)
Calcium: 9.1 mg/dL (ref 8.9–10.3)
Chloride: 101 mmol/L (ref 98–111)
Creatinine, Ser: 1.22 mg/dL — ABNORMAL HIGH (ref 0.44–1.00)
GFR, Estimated: 45 mL/min — ABNORMAL LOW (ref >60)
Glucose, Bld: 161 mg/dL — ABNORMAL HIGH (ref 70–99)
Potassium: 3.7 mmol/L (ref 3.5–5.1)
Sodium: 137 mmol/L (ref 135–145)
Total Bilirubin: 0.5 mg/dL (ref 0.0–1.2)
Total Protein: 8 g/dL (ref 6.5–8.1)

## 2023-08-04 NOTE — ED Notes (Signed)
PT LEFT - WAS GOING TO FOLLOW UP WITH PCP

## 2023-08-04 NOTE — ED Triage Notes (Signed)
Pt reports she is here today due to hypertension. Pt reports her PCP recently changed her bp meds and she states she unable to control her bp. Pt reports dizziness and reports feeling of confusion.

## 2023-08-04 NOTE — Progress Notes (Signed)
 Established Patient Office Visit  Subjective   Patient ID: Doris Jackson, female    DOB: September 22, 1940  Age: 83 y.o. MRN: [id number]  Chief Complaint  Patient presents with   Hypertension    ER last night- was not given any medication Was taking off BP meds a few weeks ago with [place](thought it could be causing cough) Acid reflux meds make her feel a little dizzy.     HPI Discussed the use of AI scribe software for clinical note transcription with the patient, who gave verbal consent to proceed.  History of Present Illness   Doris Jackson is an 83 year old female with hypertension who presents with difficulty stabilizing blood pressure and medication side effects.  She has been experiencing difficulty stabilizing her blood pressure following a recent change in medication. She was switched to a new medication, which she refers to as 'sardine', and experienced side effects such as feeling faint and lightheaded, particularly on Sunday morning while standing in the kitchen. She has been taking this medication for about a week but feels it is not agreeing with her. She has since reverted to her previous medication regimen, which includes amlodipine , hydrochlorothiazide , and metoprolol . Her blood pressure was 165/80 mmHg this morning before leaving for the appointment.  She describes an episode where her blood pressure was significantly elevated, reaching 165/80 mmHg, which led to an emergency room visit. During this visit, she underwent blood tests and an EKG. She also notes that she has not had much sleep, estimating less than an hour of sleep recently.  She has been experiencing a persistent cough for the past three months, which she associates with a previous blood pressure medication that was recently discontinued. She has been referred to a pulmonary specialist for further evaluation of her lung issues, including a recent chest x-ray that showed no pneumonia.  She has a history of acid  reflux and has been on various medications for it, including a recent switch to pantoprazole  about two to three weeks ago. Certain foods exacerbate her symptoms, leading to loose stools, and she takes a probiotic before meals to help manage this. She avoids acidic foods and drinks, such as orange juice, and tries to stick to water . She also mentions lactose intolerance and dietary adjustments to manage her symptoms.  She reports a history of anemia, with a hemoglobin level of 9.9, and takes an iron  supplement daily, although she occasionally misses doses. She has experienced dark stools, which she attributes to her iron  supplement and a previous medication. She also mentions a history of dental work and occasional epistaxis, which she associates with allergies.      Patient Active Problem List   Diagnosis Date Noted   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/2022   Anxiety state    Cervical myelopathy (HCC) 09/22/2019   S/P cervical spinal fusion 09/22/2019   GERD (gastroesophageal reflux disease) 09/14/2019   Spinal stenosis in cervical region 09/14/2019   Numbness and tingling 09/14/2019   Status post cataract extraction and insertion of intraocular lens of left eye 09/04/2019   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/24/2019   Persistent cough 06/06/2019   History of Descemet membrane endothelial keratoplasty (DMEK) 01/04/2019   Status post cataract extraction and insertion of intraocular lens of right eye 01/04/2019   Senile nuclear sclerosis 01/02/2019   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   Anatomical narrow angle, 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   Obesity, morbid, BMI 40.0-49.9 (HCC) 04/19/2018   Cataract  DM (diabetes mellitus) type II controlled with renal manifestation (HCC) 06/17/2010   Dyslipidemia 06/17/2010   Microcytic anemia 06/17/2010   Essential hypertension 06/17/2010   Allergic rhinitis 06/17/2010   Osteoarthritis 06/17/2010   LOW BACK PAIN 06/17/2010   BREAST CANCER, HX  OF 06/17/2010   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   HYPERLIPIDEMIA 06/17/2010   HYPERTENSION 06/17/2010   Leg swelling    LOW BACK PAIN 06/17/2010   OSTEOARTHRITIS 06/17/2010   OSTEOPENIA 06/17/2010   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   Weakness    Past Surgical History:  Procedure Laterality Date   ABDOMINAL HYSTERECTOMY  1988   TAH,LSO   ANTERIOR CERVICAL DECOMP/DISCECTOMY FUSION N/A 09/15/2019   Procedure: Cervical five CorpectomyANTERIOR CERVICAL DECOMPRESSION/DISCECTOMY FUSION CERVICAL THREE-CERVICAL Four;  Surgeon: Gillie Duncans, MD;  Location: [facility] OR;  Service: Neurosurgery;  Laterality: N/A;   BREAST LUMPECTOMY Right 2007   LUMPECTOMY FOLLOWED BY RADIATION   COLONOSCOPY     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   EYE SURGERY Bilateral    Cataract   HARDWARE REMOVAL N/A 10/21/2019   Procedure: Anterior cervical plate removal with revision;  Surgeon: Gillie Duncans, MD;  Location: [facility] OR;  Service: Neurosurgery;  Laterality: N/A;  3C   Lazer Bilateral    OOPHORECTOMY  1988   TAH,LSO   REVERSE SHOULDER ARTHROPLASTY Right 09/19/2020   Procedure: REVERSE SHOULDER ARTHROPLASTY;  Surgeon: Cristy Bonner DASEN, MD;  Location: [facility] ORS;  Service: Orthopedics;  Laterality: Right;   TUBAL LIGATION     Social History   Tobacco Use   Smoking status: Never   Smokeless tobacco: Never  Vaping Use   Vaping status: Never Used  Substance Use Topics   Alcohol use: No   Drug use: No   Social History    Socioeconomic History   Marital status: Married    Spouse name: Not on file   Number of children: 3   Years of education: college   Highest education level: Not on file  Occupational History   Not on file  Tobacco Use   Smoking status: Never   Smokeless tobacco: Never  Vaping Use   Vaping status: Never Used  Substance and Sexual Activity   Alcohol use: No   Drug use: No   Sexual activity: Never    Birth control/protection: Post-menopausal, Surgical    Comment: Tubal Ligation  Other Topics Concern   Not on file  Social History Narrative   Retired agricultural engineer   Social Drivers of Health   Financial Resource Strain: Low Risk  (11/26/2021)   Overall Financial Resource Strain (CARDIA)    Difficulty of Paying Living Expenses: Not hard at all  Food Insecurity: No Food Insecurity (11/26/2021)   Hunger Vital Sign    Worried About Running Out of Food in the Last Year: Never true    Ran Out of Food in the Last Year: Never true  Transportation Needs: No Transportation Needs (11/26/2021)   PRAPARE - Transportation  Lack of Transportation (Medical): No    Lack of Transportation (Non-Medical): No  Physical Activity: Insufficiently Active (11/26/2021)   Exercise Vital Sign    Days of Exercise per Week: 3 days    Minutes of Exercise per Session: 20 min  Stress: No Stress Concern Present (11/26/2021)   [employer] of Occupational [employer] - Occupational Stress Questionnaire    Feeling of Stress : Not at all  Social Connections: Unknown (11/12/2021)   Received from [facility], [facility]   Social Network    Social Network: Not on file  Intimate Partner Violence: Unknown (10/03/2021)   Received from [facility], [facility]   HITS    Physically Hurt: Not on file    Insult or Talk Down To: Not on file    Threaten Physical Harm: Not on file    Scream or Curse: Not on file   Family Status  Relation Name Status   Mother  Deceased   Sister  Deceased   Father   Deceased   Brother  Alive  No partnership data on file   Family History  Problem Relation Age of Onset   Diabetes Mother    Hypertension Sister    Asthma Father    Diabetes Brother    Allergies  Allergen Reactions   Dust Mite Extract Itching and Cough   Latex Itching    Powder gloves      Review of Systems  Constitutional:  Negative for fever and malaise/fatigue.  HENT:  Negative for congestion.   Eyes:  Negative for blurred vision.  Respiratory:  Negative for cough and shortness of breath.   Cardiovascular:  Negative for chest pain, palpitations and leg swelling.  Gastrointestinal:  Positive for constipation and heartburn. Negative for vomiting.  Musculoskeletal:  Negative for back pain.  Skin:  Negative for rash.  Neurological:  Negative for loss of consciousness and headaches.      Objective:     BP 137/84   Pulse 89   Temp 97.7 F (36.5 C) (Oral)   Ht 5' 2 (1.575 m)   Wt 241 lb 6 oz (109.5 kg)   SpO2 94%   BMI 44.15 kg/m  BP Readings from Last 3 Encounters:  08/04/23 137/84  07/24/23 (!) 148/72  07/10/23 130/80   Wt Readings from Last 3 Encounters:  08/04/23 241 lb 6 oz (109.5 kg)  07/24/23 239 lb 9.6 oz (108.7 kg)  07/10/23 241 lb 6 oz (109.5 kg)   SpO2 Readings from Last 3 Encounters:  08/04/23 94%  07/24/23 99%  07/10/23 98%      Physical Exam Vitals and nursing note reviewed.  Constitutional:      General: She is not in acute distress.    Appearance: Normal appearance. She is well-developed.  HENT:     Head: Normocephalic and atraumatic.  Eyes:     General: No scleral icterus.       Right eye: No discharge.        Left eye: No discharge.  Cardiovascular:     Rate and Rhythm: Normal rate and regular rhythm.     Heart sounds: No murmur heard. Pulmonary:     Effort: Pulmonary effort is normal. No respiratory distress.     Breath sounds: Normal breath sounds.  Musculoskeletal:        General: Normal range of motion.     Cervical  back: Normal range of motion and neck supple.     Right lower leg: No edema.  Left lower leg: No edema.  Skin:    General: Skin is warm and dry.  Neurological:     Mental Status: She is alert and oriented to person, place, and time.  Psychiatric:        Mood and Affect: Mood normal.        Behavior: Behavior normal.        Thought Content: Thought content normal.        Judgment: Judgment normal.      No results found for any visits on 08/04/23.  Last CBC Lab Results  Component Value Date   WBC 6.3 08/04/2023   HGB 9.9 (L) 08/04/2023   HCT 29.1 (L) 08/04/2023   MCV 79.3 (L) 08/04/2023   MCH 27.0 08/04/2023   RDW 16.3 (H) 08/04/2023   PLT 126 (L) 08/04/2023   Last metabolic panel Lab Results  Component Value Date   GLUCOSE 161 (H) 08/04/2023   NA 137 08/04/2023   K 3.7 08/04/2023   CL 101 08/04/2023   CO2 26 08/04/2023   BUN 29 (H) 08/04/2023   CREATININE 1.22 (H) 08/04/2023   GFRNONAA 45 (L) 08/04/2023   CALCIUM  9.1 08/04/2023   PROT 8.0 08/04/2023   ALBUMIN 4.1 08/04/2023   BILITOT 0.5 08/04/2023   ALKPHOS 101 08/04/2023   AST 12 (L) 08/04/2023   ALT 11 08/04/2023   ANIONGAP 10 08/04/2023   Last lipids Lab Results  Component Value Date   CHOL 162 11/13/2022   HDL 49.30 11/13/2022   LDLCALC 89 11/13/2022   TRIG 118.0 11/13/2022   CHOLHDL 3 11/13/2022   Last hemoglobin A1c Lab Results  Component Value Date   HGBA1C 6.8 (A) 04/27/2023   Last thyroid  functions Lab Results  Component Value Date   TSH 1.20 09/07/2019   Last vitamin D  Lab Results  Component Value Date   VD25OH 77.39 10/28/2021   Last vitamin B12 and Folate Lab Results  Component Value Date   VITAMINB12 485 06/25/2021   FOLATE 14.0 06/25/2021      The ASCVD Risk score (Arnett DK, et al., 2019) failed to calculate for the following reasons:   The 2019 ASCVD risk score is only valid for ages 28 to 31    Assessment & Plan:   Problem List Items Addressed This Visit        Unprioritized   GERD (gastroesophageal reflux disease)   Relevant Orders   Ambulatory referral to Gastroenterology   Other Visit Diagnoses       Iron  deficiency anemia, unspecified iron  deficiency anemia type    -  Primary   Relevant Orders   Ambulatory referral to Gastroenterology   CBC with Differential/Platelet   Vitamin B12   IBC + Ferritin     Assessment and Plan    Hypertension Hypertension is currently stable after recent medication changes. She is on amlodipine , hydrochlorothiazide , and metoprolol . A recent episode of elevated blood pressure (165/80) led to an ER visit, but today's reading is well-controlled. She reported feeling faint and lightheaded with a new medication, which has been discontinued. Continue amlodipine , hydrochlorothiazide  12.5 mg, and metoprolol . Bring a blood pressure cuff to the next primary care visit for accuracy check.  Anemia Chronic anemia persists with a hemoglobin level of 9.9. Iron  supplementation is inconsistent, and dark stools suggest possible gastrointestinal bleeding. A gastroenterology evaluation and consistent iron  supplementation are necessary. Order a hemoglobin test and refer to a gastroenterologist for potential GI bleeding evaluation. Continue iron  supplements.  Gastroesophageal Reflux Disease (GERD)  Chronic acid reflux is exacerbated by certain foods. She is on pantoprazole  but reports feeling limp, possibly due to medication or anemia. Dietary modifications and the importance of continuing pantoprazole  were discussed. Continue pantoprazole  and monitor symptoms, reassessing if feeling limp persists. Refer to a gastroenterologist.  Chronic Cough A chronic cough has persisted for three months, possibly due to previous medication. A recent chest x-ray showed no pneumonia. A referral to a pulmonologist for further evaluation is in place. It is important to keep the pulmonology appointment and monitor for any new or worsening  symptoms.  General Health Maintenance At 83 years old, she manages multiple chronic conditions and takes vitamin D3 and B12 supplements. The importance of a balanced diet, including iron -rich foods, to manage anemia was discussed. Encourage consistent medication adherence and a balanced diet with iron -rich foods like liver and spinach.  Follow-up Follow up with the primary care physician on February 19. Ensure follow-up with the gastroenterologist and pulmonologist. Call if any new or worsening symptoms occur.        No follow-ups on file.    Caine Barfield R Lowne Chase, DO

## 2023-08-04 NOTE — Patient Instructions (Signed)
 Indigestion Indigestion is a feeling of pain, discomfort, burning, or fullness in the upper part of your belly. It can come and go. It may happen often or rarely. In some cases, it may be a symptom of another condition. Indigestion tends to happen while eating or right after eating. It may be worse: When you lie down. At night. When you bend over. Follow these instructions at home: Eating and drinking Follow an eating plan as told by your health care provider. You may need to avoid certain foods and drinks. These may include: Chocolate and cocoa. Peppermint and mint flavorings. Garlic and onions. Horseradish. Spicy and acidic foods. These include: Peppers. Chili powder and curry powder. Vinegar. Hot sauces and BBQ sauce. Citrus fruits and juices. These include: Oranges. Lemons. Limes. Tomato-based foods. These include: Red sauce and pizza with red sauce. Chili. Salsa. Fried and fatty foods. These include: Donuts. [place] fries. Potato chips. High-fat dressings. High-fat meats. These include: Hot dogs and sausage. Rib eye steak. Ham and bacon. High-fat dairy items. These include: Whole milk. Butter. Cream cheese. Coffee and tea, with or without caffeine. Alcohol. Energy drinks and sports drinks. Fizzy drinks or sodas. Eat small meals often. Avoid eating big meals. Avoid drinking lots of liquid with your meals. Try not to eat meals during the 2-3 hours before bedtime. Try not to lie down right after you eat. Avoid exercise for 2 hours after you eat. Lifestyle  Stay at a healthy weight. Ask your provider what weight is healthy for you. If you need to lose weight, work with your provider to do so safely. Avoid exercises where you have to bend forward. This can make your symptoms worse. Wear loose clothes. Do not wear things that are tight around your waist. Do not smoke, vape, or use nicotine or tobacco. These can make your symptoms worse. If you need help quitting,  talk with your provider. When you sleep, try: Raising the head of your bed about 6 inches (15 cm) when you sleep. You can use a wedge to do this. Lying down on your left side. Try to lower your stress. If you need help doing this, ask your provider. General instructions Take your medicines only as told by your provider. Do not take aspirin or ibuprofen unless you're told to. Watch for any changes in your symptoms. Contact a health care provider if: You have new symptoms. You have trouble swallowing. It hurts to swallow. Your symptoms don't get better with treatment. Your symptoms last for more than 2 days. You vomit. Get help right away if: You have pain all of a sudden in your: Arm. Neck. Jaw. Back. You feel sweaty, dizzy, or light-headed all of a sudden. You faint. You have chest pain or shortness of breath. You can't stop vomiting. You vomit blood. Your poop is bloody or black. You have very bad pain in your belly. These symptoms may be an emergency. Call 911 right away. Do not wait to see if the symptoms will go away. Do not drive yourself to the hospital. This information is not intended to replace advice given to you by your health care provider. Make sure you discuss any questions you have with your health care provider. Document Revised: 04/28/2023 Document Reviewed: 11/07/2022 Elsevier Patient Education  2024 [employer].

## 2023-08-04 NOTE — Telephone Encounter (Unsigned)
Copied from CRM [id number]. Topic: Clinical - Pink Word Triage >> Aug 04, 2023  8:17 AM Fredrich Romans wrote: Reason for Triage: elevated b/p ,went to ED ,stayed for hours and didn't not get to see doctor

## 2023-08-05 LAB — CBC WITH DIFFERENTIAL/PLATELET
Basophils Absolute: 0.1 10*3/uL (ref 0.0–0.1)
Basophils Relative: 0.9 % (ref 0.0–3.0)
Eosinophils Absolute: 0.1 10*3/uL (ref 0.0–0.7)
Eosinophils Relative: 2 % (ref 0.0–5.0)
HCT: 31.5 % — ABNORMAL LOW (ref 36.0–46.0)
Hemoglobin: 10.6 g/dL — ABNORMAL LOW (ref 12.0–15.0)
Lymphocytes Relative: 26.4 % (ref 12.0–46.0)
Lymphs Abs: 1.9 10*3/uL (ref 0.7–4.0)
MCHC: 33.5 g/dL (ref 30.0–36.0)
MCV: 81.8 fL (ref 78.0–100.0)
Monocytes Absolute: 0.7 10*3/uL (ref 0.1–1.0)
Monocytes Relative: 10.2 % (ref 3.0–12.0)
Neutro Abs: 4.3 10*3/uL (ref 1.4–7.7)
Neutrophils Relative %: 60.5 % (ref 43.0–77.0)
Platelets: 126 10*3/uL — ABNORMAL LOW (ref 150.0–400.0)
RBC: 3.86 Mil/uL — ABNORMAL LOW (ref 3.87–5.11)
RDW: 17.5 % — ABNORMAL HIGH (ref 11.5–15.5)
WBC: 7 10*3/uL (ref 4.0–10.5)

## 2023-08-05 LAB — IBC + FERRITIN
Ferritin: 51.3 ng/mL (ref 10.0–291.0)
Iron: 48 ug/dL (ref 42–145)
Saturation Ratios: 13.9 % — ABNORMAL LOW (ref 20.0–50.0)
TIBC: 345.8 ug/dL (ref 250.0–450.0)
Transferrin: 247 mg/dL (ref 212.0–360.0)

## 2023-08-05 LAB — VITAMIN B12: Vitamin B-12: >1537 pg/mL — ABNORMAL HIGH (ref 211–911)

## 2023-08-11 DIAGNOSIS — R053 Chronic cough: Secondary | ICD-10-CM | POA: Diagnosis not present

## 2023-08-12 ENCOUNTER — Ambulatory Visit: Payer: Self-pay | Admitting: Family Medicine

## 2023-08-12 NOTE — Telephone Encounter (Signed)
Copied from CRM [id number]. Topic: Clinical - Pink Word Triage >> Aug 12, 2023  9:21 AM Mosetta Putt H wrote: Reason for Triage: having elevated blood pressure 142/90 need adjustment of medication   Chief Complaint: Blood Pressure-High Symptoms: Dizziness,  Frequency: Since Yesterday Pertinent Negatives: Patient denies chest pain, neurological deficit, headache. Disposition: [] ED /[] Urgent Care (no appt availability in office) / [x] Appointment(In office/virtual)/ []  [place] [facility]/ [] Home Care/ [] Refused Recommended Disposition /[]  Mobile Bus/ []  Follow-up with PCP Additional Notes: Patient contacted via phone regarding concerns of blood pressure readings. Discussed symptoms, severity, and duration. Based on assessment, patient was advised to keep appointment on the 19 th of this month. Patient verbalized understanding and agreement with plan. Documentation provided. Provided thorough education regarding symptom management and therapeutic timeline of treatment regimen.      Reason for Disposition  [1] Systolic BP  >= 130 OR Diastolic >= 80 AND [2] taking BP medications  Answer Assessment - Initial Assessment Questions 1. BLOOD PRESSURE: "What is the blood pressure?" "Did you take at least two measurements 5 minutes apart?"     Yesterday, 142/90  2. ONSET: "When did you take your blood pressure?"     Yesterday  3. HOW: "How did you take your blood pressure?" (e.g., automatic home BP monitor, visiting nurse)     Automatic  4. HISTORY: "Do you have a history of high blood pressure?"     Yes  5. MEDICINES: "Are you taking any medicines for blood pressure?" "Have you missed any doses recently?"     Yes  6. OTHER SYMPTOMS: "Do you have any symptoms?" (e.g., blurred vision, chest pain, difficulty breathing, headache, weakness)     Dizziness  7. PREGNANCY: "Is there any chance you are pregnant?" "When was your last menstrual period?"     No  Protocols used: Blood Pressure -  High-A-AH

## 2023-08-14 NOTE — Telephone Encounter (Signed)
Noted- ok to close.

## 2023-08-17 ENCOUNTER — Ambulatory Visit: Payer: Self-pay | Admitting: Family Medicine

## 2023-08-17 DIAGNOSIS — J3089 Other allergic rhinitis: Secondary | ICD-10-CM | POA: Diagnosis not present

## 2023-08-17 DIAGNOSIS — J301 Allergic rhinitis due to pollen: Secondary | ICD-10-CM | POA: Diagnosis not present

## 2023-08-17 DIAGNOSIS — J3081 Allergic rhinitis due to animal (cat) (dog) hair and dander: Secondary | ICD-10-CM | POA: Diagnosis not present

## 2023-08-17 DIAGNOSIS — I10 Essential (primary) hypertension: Secondary | ICD-10-CM

## 2023-08-17 NOTE — Telephone Encounter (Signed)
This RN attempted to call patient back. No answer. LVM. (1st attempt)  Copied from CRM [id number]. Topic: Clinical - Medication Question >> Aug 17, 2023  5:05 PM Denese Killings wrote: Reason for CRM:  Patient wants an increase in dosage of amLODipine (NORVASC) 5 MG tablet.

## 2023-08-17 NOTE — Telephone Encounter (Signed)
Chief Complaint: Hypertension Symptoms: Dizziness, shakiness with movement Frequency: Intermittent Pertinent Negatives: Patient denies blurred vision, chest pain Disposition: [] ED /[] Urgent Care (no appt availability in office) / [x] Appointment(In office/virtual)/ []  [place] [facility]/ [] Home Care/ [] Refused Recommended Disposition /[] [place] [facility]/ []  Follow-up with PCP Additional Notes: Pt states she has had issues with her BP going up and down for the past few weeks. Pt states she has felt off balance. Pt states she had a dental appt today where her BP was elevated. Pt states she had difficulty walking to car her head was going "round and round." Pt states she has an appt with Dr. Casimiro Needle on 2/19. Pt wants to know if the amlodipine can be changed from 10 mg instead of 5 mg. Pt states she was started on olmesartan 40 mg daily 2-3 weeks ago. This is when the dizziness started. Pt denies being dizzy at this time. Pt has an appt on 2/19 with Dr. Casimiro Needle. This RN educated pt on home care, new-worsening symptoms, when to call back/seek emergent care. Pt verbalized understanding and agrees to plan.      Copied from CRM [id number]. Topic: Clinical - Red Word Triage >> Aug 17, 2023  5:03 PM Denese Killings wrote: Red Word that prompted transfer to Nurse Triage: Patient wen to the Dentist for a filling today and her pressure was high. Symptoms- 180/97 and went down after walking it was 150/81, and feeling off balance. Reason for Disposition  [1] Taking BP medications AND [2] feels is having side effects (e.g., impotence, cough, dizzy upon standing)  [1] MILD dizziness (e.g., walking normally) AND [2] has NOT been evaluated by doctor (or NP/PA) for this  (Exception: Dizziness caused by heat exposure, sudden standing, or poor fluid intake.)  Answer Assessment - Initial Assessment Questions 1. BLOOD PRESSURE: "What is the blood pressure?" "Did you take at least two measurements 5 minutes apart?"      180/97 at dentist today, it went down to 150/81 2. ONSET: "When did you take your blood pressure?"     Today 3. HOW: "How did you take your blood pressure?" (e.g., automatic home BP monitor, visiting nurse)     Dental office 4. HISTORY: "Do you have a history of high blood pressure?"     Yes 5. MEDICINES: "Are you taking any medicines for blood pressure?" "Have you missed any doses recently?"     Amlodipine 5 mg  Protocols used: Blood Pressure - High-A-AH, Dizziness - Lightheadedness-A-AH

## 2023-08-18 ENCOUNTER — Telehealth: Payer: Self-pay | Admitting: Family Medicine

## 2023-08-18 ENCOUNTER — Other Ambulatory Visit: Payer: Self-pay | Admitting: Family Medicine

## 2023-08-18 DIAGNOSIS — E1129 Type 2 diabetes mellitus with other diabetic kidney complication: Secondary | ICD-10-CM

## 2023-08-18 MED ORDER — AMLODIPINE BESYLATE 10 MG PO TABS: 10.0000 mg | ORAL_TABLET | Freq: Every day | ORAL | 1 refills | Status: DC

## 2023-08-18 NOTE — Telephone Encounter (Signed)
I called in the 10 mg amlodipine, please have patient increase her amlodipine to 10 mg daily.

## 2023-08-18 NOTE — Telephone Encounter (Signed)
 Patient informed of the message below.

## 2023-08-19 ENCOUNTER — Ambulatory Visit (INDEPENDENT_AMBULATORY_CARE_PROVIDER_SITE_OTHER): Payer: Medicare HMO | Admitting: Family Medicine

## 2023-08-19 ENCOUNTER — Encounter: Payer: Self-pay | Admitting: Family Medicine

## 2023-08-19 VITALS — Wt 239.0 lb

## 2023-08-19 DIAGNOSIS — I10 Essential (primary) hypertension: Secondary | ICD-10-CM

## 2023-08-19 NOTE — Assessment & Plan Note (Signed)
BP is better controlled on the 10 mg amlodipine daily. The reason her BP was elevated was because she stopped the olmesartan due to having side effects. Will continue the medications as listed below. I had called in the 10 mg amlodipine yesterday already for her. RTC in 1 month for BP check in the office.  Current hypertension medications:       Sig   amLODipine (NORVASC) 10 MG tablet (Taking) Take 1 tablet (10 mg total) by mouth daily.   furosemide (LASIX) 20 MG tablet (Taking As Needed) TAKE 1 TABLET BY MOUTH ONCE DAILY AS NEEDED   hydrochlorothiazide (HYDRODIURIL) 12.5 MG tablet (Taking) Take 1 tablet (12.5 mg total) by mouth daily.   metoprolol succinate (TOPROL-XL) 50 MG 24 hr tablet (Taking) Take 1 tablet (50 mg total) by mouth daily. Take with or immediately following a meal.

## 2023-08-19 NOTE — Progress Notes (Signed)
Virtual Medical Office Visit  Patient:  Doris Jackson      Age: 83 y.o.       Sex:  female  Date:   08/19/2023  PCP:    Karie Georges, MD   Today's Healthcare Provider: Karie Georges, MD    Assessment/Plan:   Summary assessment:  Alexsandra was seen today for medical management of chronic issues.  Essential hypertension Overview: Qualifier: Diagnosis of  By: Amador Cunas  MD, Janett Labella   Assessment & Plan: BP is better controlled on the 10 mg amlodipine daily. The reason her BP was elevated was because she stopped the olmesartan due to having side effects. Will continue the medications as listed below. I had called in the 10 mg amlodipine yesterday already for her. RTC in 1 month for BP check in the office.  Current hypertension medications:       Sig   amLODipine (NORVASC) 10 MG tablet (Taking) Take 1 tablet (10 mg total) by mouth daily.   furosemide (LASIX) 20 MG tablet (Taking As Needed) TAKE 1 TABLET BY MOUTH ONCE DAILY AS NEEDED   hydrochlorothiazide (HYDRODIURIL) 12.5 MG tablet (Taking) Take 1 tablet (12.5 mg total) by mouth daily.   metoprolol succinate (TOPROL-XL) 50 MG 24 hr tablet (Taking) Take 1 tablet (50 mg total) by mouth daily. Take with or immediately following a meal.            Return in about 1 month (around 09/16/2023) for HTN.   She was advised to call the office or go to ER if her condition worsens    Subjective:   Doris Jackson is a 83 y.o. 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  Patient is feeling better.     She clarifies and reports that her condition: Pt is here to discuss her blood pressure medication. States that she did start the 5 mg amlodipine along with her other medications, but states that her BP remained elevated. Patient states that she stopped the olmesartan because it was making her "feel crazy" and "legs weak", although Pt already picked up the 10 mg amlodipine and started this dose yesterday. States her home BP cuff had a BP this morning of 125 systolic. States that her symptoms of high blood pressure have improved since her blood pressure has improved.    She denies having any: Chest pain, SOB or headaches.           Objective/Observations  Physical Exam:  Polite and friendly Gen: NAD, alert and oriented, although her speech is a little pressured  No images are attached to the encounter or orders placed in the encounter.    Results: No results found for any visits on 08/19/23.          Virtual Visit via Video   I connected with Doretha Sou on 08/19/23 at 11:30 AM EST by an audio only enabled telemedicine application and verified that I am speaking with the correct person using two identifiers. The limitations of evaluation and management by telemedicine and the availability of in person appointments were discussed. The patient expressed understanding and agreed to proceed.   Percentage of appointment time on audio:  100% Patient location: Home Provider location: Home Persons participating in the virtual visit: Myself and Patient

## 2023-08-19 NOTE — Progress Notes (Signed)
 Per patient no change in vitals since last visit, unable to obtain new vitals due to telehealth visit.

## 2023-08-22 ENCOUNTER — Other Ambulatory Visit: Payer: Self-pay | Admitting: Family Medicine

## 2023-09-01 DIAGNOSIS — J3089 Other allergic rhinitis: Secondary | ICD-10-CM | POA: Diagnosis not present

## 2023-09-01 DIAGNOSIS — J3081 Allergic rhinitis due to animal (cat) (dog) hair and dander: Secondary | ICD-10-CM | POA: Diagnosis not present

## 2023-09-03 DIAGNOSIS — J3089 Other allergic rhinitis: Secondary | ICD-10-CM | POA: Diagnosis not present

## 2023-09-03 DIAGNOSIS — J301 Allergic rhinitis due to pollen: Secondary | ICD-10-CM | POA: Diagnosis not present

## 2023-09-10 DIAGNOSIS — R053 Chronic cough: Secondary | ICD-10-CM | POA: Diagnosis not present

## 2023-09-10 DIAGNOSIS — R911 Solitary pulmonary nodule: Secondary | ICD-10-CM | POA: Diagnosis not present

## 2023-09-10 DIAGNOSIS — R918 Other nonspecific abnormal finding of lung field: Secondary | ICD-10-CM | POA: Diagnosis not present

## 2023-09-15 ENCOUNTER — Encounter: Payer: Self-pay | Admitting: Family Medicine

## 2023-09-15 ENCOUNTER — Ambulatory Visit (INDEPENDENT_AMBULATORY_CARE_PROVIDER_SITE_OTHER): Admitting: Family Medicine

## 2023-09-15 VITALS — BP 138/62 | HR 85 | Temp 97.4°F | Ht 62.0 in | Wt 237.3 lb

## 2023-09-15 DIAGNOSIS — L02213 Cutaneous abscess of chest wall: Secondary | ICD-10-CM | POA: Diagnosis not present

## 2023-09-15 DIAGNOSIS — I10 Essential (primary) hypertension: Secondary | ICD-10-CM | POA: Diagnosis not present

## 2023-09-15 DIAGNOSIS — J301 Allergic rhinitis due to pollen: Secondary | ICD-10-CM | POA: Diagnosis not present

## 2023-09-15 DIAGNOSIS — J3089 Other allergic rhinitis: Secondary | ICD-10-CM | POA: Diagnosis not present

## 2023-09-15 MED ORDER — HYDROCHLOROTHIAZIDE 25 MG PO TABS: 25.0000 mg | ORAL_TABLET | Freq: Every day | ORAL | 1 refills | Status: DC

## 2023-09-15 MED ORDER — DOXYCYCLINE HYCLATE 100 MG PO TABS: 100.0000 mg | ORAL_TABLET | Freq: Two times a day (BID) | ORAL | 0 refills | Status: AC

## 2023-09-15 NOTE — Assessment & Plan Note (Signed)
 Current hypertension medications:       Sig   amLODipine (NORVASC) 10 MG tablet (Taking) Take 1 tablet (10 mg total) by mouth daily.   furosemide (LASIX) 20 MG tablet (Taking As Needed) TAKE 1 TABLET BY MOUTH ONCE DAILY AS NEEDED   hydrochlorothiazide (HYDRODIURIL) 25 MG tablet (Taking) Take 1 tablet (25 mg total) by mouth daily.   metoprolol succinate (TOPROL-XL) 50 MG 24 hr tablet (Taking) Take 1 tablet (50 mg total) by mouth daily. Take with or immediately following a meal.      Chronic, stable, BP is better with rechecking it, however I still think increasing her hydrochlorothiazide to 25 mg daily will help ensure that it remains in the normal range. Will see her back in 4-6 weeks for another BP check.

## 2023-09-15 NOTE — Progress Notes (Signed)
 Established Patient Office Visit  Subjective   Patient ID: Doris Jackson, female    DOB: 1941/03/20  Age: 83 y.o. MRN: [id number]  Chief Complaint  Patient presents with   Hypertension    Patient presents for follow up due to elevated BP, has been taking BP at home and did not bring the readings today   Cyst    Patient complains of a painful, red cyst on the center of her chest x1 week, states she used warm compresses    Pt is here for BP follow up. She reports that her BP at home is high when she gets up and moves around, but when she sits down and checks it it is in the normal range. Recheck BP today was 138/62.  Hypertension This is a chronic problem. The current episode started more than 1 year ago. The problem is unchanged. The problem is controlled.    Current Outpatient Medications  Medication Instructions   acetaminophen (TYLENOL) 500 mg, Every 6 hours PRN   amLODipine (NORVASC) 10 mg, Oral, Daily   benzonatate (TESSALON) 200 mg, 3 times daily PRN   Blood Glucose Monitoring Suppl (ONETOUCH VERIO REFLECT) w/Device KIT USE UP TO FOUR TIMES DAILY AS DIRECTED.   cyanocobalamin 2,000 mcg, Daily   diclofenac Sodium (VOLTAREN) 2 g, Topical, 4 times daily   doxycycline (VIBRA-TABS) 100 mg, Oral, 2 times daily   famotidine (PEPCID) 20 mg, Oral, Daily at bedtime   Ferrex 150 150 mg, Oral, Daily   fexofenadine (ALLEGRA) 180 mg, Daily PRN   fluticasone (FLONASE) 50 MCG/ACT nasal spray 1 spray, Each Nare, Daily   furosemide (LASIX) 20 mg, Oral, Daily PRN   glipiZIDE (GLUCOTROL) 5 mg, Oral, Daily before breakfast   glucose blood (ONETOUCH VERIO) test strip USE 1 STRIP TO CHECK GLUCOSE 2-4 TIMES DAILY AS NEEDED   hydrochlorothiazide (HYDRODIURIL) 25 mg, Oral, Daily   HYDROcodone-acetaminophen (NORCO) 5-325 MG tablet 1 tablet, Oral, Daily PRN   Lancets 30G MISC USE TWICE DAILY AS DIRECTED FOR GLUCOSE TESTING AND MONITORING   Lancets MISC Use as directed   metFORMIN (GLUCOPHAGE-XR)  500 mg, Oral, 2 times daily   metoprolol succinate (TOPROL-XL) 50 mg, Oral, Daily, Take with or immediately following a meal.   ONE TOUCH LANCETS MISC Test twice daily.   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/2022   Anxiety state    Cervical myelopathy (HCC) 09/22/2019   S/P cervical spinal fusion 09/22/2019   GERD (gastroesophageal reflux disease) 09/14/2019   Spinal stenosis in cervical region 09/14/2019   Numbness and tingling 09/14/2019   Status post cataract extraction and insertion of intraocular lens of left eye 09/04/2019   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/24/2019   Persistent cough 06/06/2019   History of Descemet membrane endothelial keratoplasty (DMEK) 01/04/2019   Status post cataract extraction and insertion of intraocular lens of right eye 01/04/2019  Senile nuclear sclerosis 01/02/2019   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   Anatomical narrow angle, 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   Obesity, morbid, BMI 40.0-49.9 (HCC) 04/19/2018   Cataract    DM (diabetes mellitus) type II controlled with renal manifestation (HCC) 06/17/2010   Dyslipidemia 06/17/2010   Microcytic anemia 06/17/2010   Essential hypertension 06/17/2010   Allergic rhinitis 06/17/2010   Osteoarthritis 06/17/2010   LOW BACK PAIN 06/17/2010   BREAST CANCER, HX OF 06/17/2010      ROS    Objective:     BP 138/62 (BP Location: Left Arm,  Patient Position: Sitting, Cuff Size: Large)   Pulse 85   Temp (!) 97.4 F (36.3 C) (Oral)   Ht 5\' 2"  (1.575 m)   Wt 237 lb 4.8 oz (107.6 kg)   SpO2 98%   BMI 43.40 kg/m    Physical Exam Vitals reviewed.  Constitutional:      Appearance: Normal appearance. She is obese.  Pulmonary:     Effort: Pulmonary effort is normal.  Neurological:     Mental Status: She is alert and oriented to person, place, and time. Mental status is at baseline.  Psychiatric:        Mood and Affect: Mood normal.        Behavior: Behavior normal.      No results found for any visits on 09/15/23.    The ASCVD Risk score (Arnett DK, et al., 2019) failed to calculate for the following reasons:   The 2019 ASCVD risk score is only valid for ages 73 to 20    Assessment & Plan:  Essential hypertension Assessment & Plan: Current hypertension medications:       Sig   amLODipine (NORVASC) 10 MG tablet (Taking) Take 1 tablet (10 mg total) by mouth daily.   furosemide (LASIX) 20 MG tablet (Taking As Needed) TAKE 1 TABLET BY MOUTH ONCE DAILY AS NEEDED   hydrochlorothiazide (HYDRODIURIL) 25 MG tablet (Taking) Take 1 tablet (25 mg total) by mouth daily.   metoprolol succinate (TOPROL-XL) 50 MG 24 hr tablet (Taking) Take 1 tablet (50 mg total) by mouth daily. Take with or immediately following a meal.      Chronic, stable, BP is better with rechecking it, however I still think increasing her hydrochlorothiazide to 25 mg daily will help ensure that it remains in the normal range. Will see her back in 4-6 weeks for another BP check.  Orders: -     hydroCHLOROthiazide; Take 1 tablet (25 mg total) by mouth daily.  Dispense: 90 tablet; Refill: 1  Cutaneous abscess of chest wall -     Doxycycline Hyclate; Take 1 tablet (100 mg total) by mouth 2 (two) times daily for 7 days.  Dispense: 14 tablet; Refill: 0   I expressed about 5 ml of purulent odorous drainage from the wound on her chest. It was fairly large,  about 3 cm abscess plus surrounding erythema and induration of about 5 cm. Patient felt less pain after the drainage. Will treat with 1 week of doxycycline for the infection. I spent 30 minutes total with the patient today on 1 uncontrolled chronic medical problem and 1 acute problem (draining the wound to help with pain and infection). Incision was not performed because the wound had already opened.   Return in about 1 month (around 10/16/2023) for HTN.    Karie Georges, MD

## 2023-09-21 ENCOUNTER — Other Ambulatory Visit: Payer: Self-pay | Admitting: Family Medicine

## 2023-09-21 NOTE — Telephone Encounter (Signed)
 Copied from CRM [id number]. Topic: Clinical - Medication Refill >> Sep 21, 2023  4:59 PM Florestine Avers wrote: Most Recent Primary Care Visit:  Provider: Karie Georges  Department: [facility]  Visit Type: OFFICE VISIT  Date: 09/15/2023  Medication: Lancets 30G MISC  Has the patient contacted their pharmacy? Yes (Agent: If no, request that the patient contact the pharmacy for the refill. If patient does not wish to contact the pharmacy document the reason why and proceed with request.) (Agent: If yes, when and what did the pharmacy advise?)  Is this the correct pharmacy for this prescription? Yes If no, delete pharmacy and type the correct one.  This is the patient's preferred pharmacy:  [facility] [address] [place] - [address] [place] [address] Phone: [phone] Fax: [phone]   Has the prescription been filled recently? No  Is the patient out of the medication? Yes  Has the patient been seen for an appointment in the last year OR does the patient have an upcoming appointment? Yes  Can we respond through MyChart? No  Agent: Please be advised that Rx refills may take up to 3 business days. We ask that you follow-up with your pharmacy.

## 2023-09-22 ENCOUNTER — Ambulatory Visit: Payer: Self-pay

## 2023-09-22 MED ORDER — LANCETS 30G MISC: 4 refills | Status: AC

## 2023-09-22 NOTE — Telephone Encounter (Signed)
  Chief Complaint: Medication questions Symptoms: nervous, anxious Frequency: Began abx 09/15/23, 2-3 days for symptoms Pertinent Negatives: Patient denies CP, SOB Disposition: [] ED /[] Urgent Care (no appt availability in office) / [] Appointment(In office/virtual)/ []  [facility]/ [] Home Care/ [] Refused Recommended Disposition /[] [facility]/ [x]  Follow-up with PCP Additional Notes: Patient states that she feels very nervous for several hours after taking her antibiotic medication. She states that she is taking one pill a day currently due to this and has 4 pills left. Patient reports that she also began increased dose of BP medication at the same time. Patient states she feels the antibiotic is causing her to feel nervous. She is asking if she can stop antibiotic that was prescribed 09/15/23. She also reports that the BP cuff she has at home is the wrong size and feels it gives in accurate readings, which makes it difficult to monitor. Care advice reviewed, alerting PCP for review and f/u.   Copied from CRM [id number]. Topic: Clinical - Red Word Triage >> Sep 22, 2023 12:59 PM Denese Killings wrote: Red Word that prompted transfer to Nurse Triage: Patient is taking a new antibiotic  doxycycline (VIBRA-TABS) 100 MG tablet and has been making her feel weak since Sunday. She states that she feels nervous, wants quietness, blood pressure elevates while walking, and feels light headed when moving around. Reason for Disposition  [1] Caller has NON-URGENT medicine question about med that PCP prescribed AND [2] triager unable to answer question  Answer Assessment - Initial Assessment Questions 1. NAME of MEDICINE: "What medicine(s) are you calling about?"     doxycycline (VIBRA-TABS) 100 MG 2. QUESTION: "What is your question?" (e.g., double dose of medicine, side effect)     States she feels nervous and anxious while taking the medication, cut the dose in half yesterday. 3. PRESCRIBER:  "Who prescribed the medicine?" Reason: if prescribed by specialist, call should be referred to that group.     PCP 4. SYMPTOMS: "Do you have any symptoms?" If Yes, ask: "What symptoms are you having?"  "How bad are the symptoms (e.g., mild, moderate, severe)     Anxious, nervous  Protocols used: Medication Question Call-A-AH

## 2023-09-22 NOTE — Telephone Encounter (Signed)
 Please reassure patient that it is normal for blood pressure to increase when she is up and walking around (I explained this to her at the last visit also). We can change her antibiotic to Bactrim if she is agreeable and this might get rid of the lightheadedness -- please let me know what she wants to do and I will call in the new abx

## 2023-09-22 NOTE — Telephone Encounter (Signed)
 Patient informed of the message below.  Patient declined changing antibiotic from Doxycycline as she has 4 more pills left and does not feel she needs any more.

## 2023-09-24 ENCOUNTER — Ambulatory Visit (HOSPITAL_COMMUNITY)
Admission: EM | Admit: 2023-09-24 | Discharge: 2023-09-24 | Disposition: A | Discharge status: Home / Self Care | Attending: Family Medicine | Admitting: Family Medicine

## 2023-09-24 ENCOUNTER — Ambulatory Visit: Payer: Self-pay | Admitting: Family Medicine

## 2023-09-24 ENCOUNTER — Encounter (HOSPITAL_COMMUNITY): Payer: Self-pay

## 2023-09-24 DIAGNOSIS — R42 Dizziness and giddiness: Secondary | ICD-10-CM | POA: Diagnosis not present

## 2023-09-24 LAB — POCT URINALYSIS DIP (MANUAL ENTRY)
Bilirubin, UA: NEGATIVE
Glucose, UA: NEGATIVE mg/dL
Ketones, POC UA: NEGATIVE mg/dL
Leukocytes, UA: NEGATIVE
Nitrite, UA: NEGATIVE
Protein Ur, POC: NEGATIVE mg/dL
Spec Grav, UA: 1.015 (ref 1.010–1.025)
Urobilinogen, UA: 0.2 U/dL
pH, UA: 5.5 (ref 5.0–8.0)

## 2023-09-24 LAB — BASIC METABOLIC PANEL WITH GFR
Anion gap: 9 (ref 5–15)
BUN: 25 mg/dL — ABNORMAL HIGH (ref 8–23)
CO2: 27 mmol/L (ref 22–32)
Calcium: 9.4 mg/dL (ref 8.9–10.3)
Chloride: 99 mmol/L (ref 98–111)
Creatinine, Ser: 1.18 mg/dL — ABNORMAL HIGH (ref 0.44–1.00)
GFR, Estimated: 46 mL/min — ABNORMAL LOW (ref >60)
Glucose, Bld: 192 mg/dL — ABNORMAL HIGH (ref 70–99)
Potassium: 3.5 mmol/L (ref 3.5–5.1)
Sodium: 135 mmol/L (ref 135–145)

## 2023-09-24 LAB — CBC WITH DIFFERENTIAL/PLATELET
Abs Immature Granulocytes: 0.06 10*3/uL (ref 0.00–0.07)
Basophils Absolute: 0 10*3/uL (ref 0.0–0.1)
Basophils Relative: 1 %
Eosinophils Absolute: 0.1 10*3/uL (ref 0.0–0.5)
Eosinophils Relative: 1 %
HCT: 33.3 % — ABNORMAL LOW (ref 36.0–46.0)
Hemoglobin: 11.1 g/dL — ABNORMAL LOW (ref 12.0–15.0)
Immature Granulocytes: 1 %
Lymphocytes Relative: 23 %
Lymphs Abs: 1.8 10*3/uL (ref 0.7–4.0)
MCH: 26.4 pg (ref 26.0–34.0)
MCHC: 33.3 g/dL (ref 30.0–36.0)
MCV: 79.1 fL — ABNORMAL LOW (ref 80.0–100.0)
Monocytes Absolute: 0.8 10*3/uL (ref 0.1–1.0)
Monocytes Relative: 11 %
Neutro Abs: 4.9 10*3/uL (ref 1.7–7.7)
Neutrophils Relative %: 63 %
Platelets: 164 10*3/uL (ref 150–400)
RBC: 4.21 MIL/uL (ref 3.87–5.11)
RDW: 16.1 % — ABNORMAL HIGH (ref 11.5–15.5)
WBC: 7.7 10*3/uL (ref 4.0–10.5)
nRBC: 0 % (ref 0.0–0.2)

## 2023-09-24 LAB — POCT FASTING CBG KUC MANUAL ENTRY: POCT Glucose (KUC): 198 mg/dL — AB (ref 70–99)

## 2023-09-24 NOTE — Telephone Encounter (Signed)
  Chief Complaint: dizziness  Symptoms: dizziness  Disposition: [] ED /[] Urgent Care (no appt availability in office) / [x] Appointment(In office/virtual)/ []  [facility]/ [] Home Care/ [] Refused Recommended Disposition /[] [facility]/ []  Follow-up with PCP Additional Notes: Pt calling with complaints of dizziness since change in medication from 3/18 appt. Pt feels she may pass out at times when she first starts moving. Pt states she feels the dizziness has worsened since starting the  Hydrochlorothiazide. Pt states she took it a long time ago and didn't feel this way. Norvasc was also increased from 5 to 10mg . Pt stated her BP this morning was 119/60 without medication. Pt states as long as she is resting she feels fine. No appt available today. Pt has appt 3/29 @ 1430. RN gave care advice and explained importance of watching the symptoms, drinking plenty of water,  and not waiting until tomorrow if this worsens. Pt verbalized understanding.           Copied from CRM [id number]. Topic: Clinical - Red Word Triage >> Sep 24, 2023 10:24 AM Almira Coaster wrote: Red Word that prompted transfer to Nurse Triage: Patient's blood pressure medication amLODipine (NORVASC) 10 MG tablet was increased from 5 mg to 10 mg and this week she's been experiencing dizziness and the feeling like she is going to pass out. Reason for Disposition  [1] MODERATE dizziness (e.g., interferes with normal activities) AND [2] has NOT been evaluated by doctor (or NP/PA) for this  (Exception: Dizziness caused by heat exposure, sudden standing, or poor fluid intake.)  Answer Assessment - Initial Assessment Questions 1. DESCRIPTION: "Describe your dizziness."    Feel like I'm going to blank out 2. LIGHTHEADED: "Do you feel lightheaded?" (e.g., somewhat faint, woozy, weak upon standing)     Not currently  3. VERTIGO: "Do you feel like either you or the room is spinning or tilting?" (i.e. vertigo)     Na  4.  SEVERITY: "How bad is it?"  "Do you feel like you are going to faint?" "Can you stand and walk?"   - MILD: Feels slightly dizzy, but walking normally.   - MODERATE: Feels unsteady when walking, but not falling; interferes with normal activities (e.g., school, work).   - SEVERE: Unable to walk without falling, or requires assistance to walk without falling; feels like passing out now.      Moderate  5. ONSET:  "When did the dizziness begin?"     Over a week  6. AGGRAVATING FACTORS: "Does anything make it worse?" (e.g., standing, change in head position)     Movement  7. HEART RATE: "Can you tell me your heart rate?" "How many beats in 15 seconds?"  (Note: not all patients can do this)       Not sure  8. CAUSE: "What do you think is causing the dizziness?"     Change in medication  9. RECURRENT SYMPTOM: "Have you had dizziness before?" If Yes, ask: "When was the last time?" "What happened that time?"     Yes, changes in medication  10. OTHER SYMPTOMS: "Do you have any other symptoms?" (e.g., fever, chest pain, vomiting, diarrhea, bleeding)       Denies all  Protocols used: Dizziness - Lightheadedness-A-AH

## 2023-09-24 NOTE — Telephone Encounter (Signed)
 Noted- ok to close.

## 2023-09-24 NOTE — Telephone Encounter (Signed)
 Patient was scheduled for an appt with PCP on 3/28.

## 2023-09-24 NOTE — ED Provider Notes (Signed)
 [facility]    CSN: [id number] Arrival date & time: 09/24/23  1244      History   Chief Complaint Chief Complaint  Patient presents with   Dizziness    HPI AUTYM SIESS is a 83 y.o. female.   83 year old female who presents to urgent care with complaints of dizziness.  She reports that this has been an ongoing issue for 2 to 3 months.  She reports that it started when her blood pressure medication and acid reflux medication were changed.  It seemed to have gotten worse when her blood pressure medicine was increased from 5 mg to 10 mg, amlodipine.  She was also taking hydrochlorothiazide and felt this was too strong so this dose was cut in half as well.  She saw her primary care physician last week and is scheduled to see them again tomorrow for this symptom.  She reports that this morning she was doing her morning routine and felt like she was going to pass out.  This symptom past.  This symptom has occurred several times in the last 2 to 3 months.  It usually is just in the mornings.  She denies any numbness, tingling, syncope, slurred speech, blurred vision, falls, fevers, chills, chest pain.  She has had a persistent cough.  This is being worked up by her primary care doctor as well.  She has a blood pressure cuff that she uses at home but was unsure if she was using it right and wanted to verify that it was accurate.     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   HYPERLIPIDEMIA 06/17/2010   HYPERTENSION 06/17/2010   Leg swelling    LOW BACK PAIN 06/17/2010   OSTEOARTHRITIS 06/17/2010   OSTEOPENIA 06/17/2010   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   Weakness     Patient Active Problem List   Diagnosis Date Noted   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/2022   Anxiety state    Cervical myelopathy (HCC) 09/22/2019   S/P cervical spinal fusion 09/22/2019   GERD (gastroesophageal reflux disease) 09/14/2019   Spinal stenosis in cervical region 09/14/2019   Numbness and tingling 09/14/2019   Status post cataract extraction and insertion of intraocular lens of left eye 09/04/2019   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/24/2019   Persistent cough 06/06/2019   History of Descemet membrane endothelial keratoplasty (DMEK) 01/04/2019   Status post cataract extraction and insertion of intraocular lens of right eye 01/04/2019   Senile nuclear sclerosis 01/02/2019   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   Anatomical narrow angle, 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   Obesity, morbid, BMI 40.0-49.9 (HCC) 04/19/2018   Cataract    DM (diabetes mellitus) type II controlled with renal manifestation (HCC) 06/17/2010   Dyslipidemia 06/17/2010   Microcytic anemia 06/17/2010   Essential hypertension 06/17/2010   Allergic rhinitis 06/17/2010   Osteoarthritis 06/17/2010   LOW BACK  PAIN 06/17/2010   BREAST CANCER, HX OF 06/17/2010    Past Surgical History:  Procedure Laterality Date   ABDOMINAL HYSTERECTOMY  1988   TAH,LSO   ANTERIOR CERVICAL DECOMP/DISCECTOMY FUSION N/A 09/15/2019   Procedure: Cervical five CorpectomyANTERIOR CERVICAL DECOMPRESSION/DISCECTOMY FUSION CERVICAL THREE-CERVICAL Four;  Surgeon: Coletta Memos, MD;  Location: [facility] OR;  Service: Neurosurgery;  Laterality: N/A;   BREAST LUMPECTOMY Right 2007   LUMPECTOMY FOLLOWED BY RADIATION   COLONOSCOPY     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   EYE SURGERY Bilateral    Cataract   HARDWARE REMOVAL N/A 10/21/2019   Procedure: Anterior cervical plate removal with revision;  Surgeon: Coletta Memos, MD;  Location: [facility] OR;  Service: Neurosurgery;  Laterality: N/A;  3C   Lazer Bilateral    OOPHORECTOMY  1988   TAH,LSO   REVERSE SHOULDER ARTHROPLASTY Right 09/19/2020   Procedure: REVERSE SHOULDER ARTHROPLASTY;  Surgeon: Bjorn Pippin, MD;  Location: [facility];  Service: Orthopedics;  Laterality: Right;   TUBAL LIGATION      OB History     Gravida  3   Para  3   Term  3   Preterm      AB  0   Living  2      SAB      IAB      Ectopic      Multiple      Live Births               Home Medications    Prior to Admission medications   Medication Sig Start Date End Date Taking? Authorizing Provider  acetaminophen (TYLENOL) 500 MG tablet Take 500 mg by mouth every 6 (six) hours as needed.    [provider]  amLODipine (NORVASC) 10 MG tablet Take 1 tablet (10 mg total) by mouth daily. 08/18/23   Karie Georges, MD  Ascorbic Acid (VITAMIN C) 1000 MG tablet Take 1,000 mg by mouth daily.    [provider]  benzonatate (TESSALON) 200 MG capsule Take 200 mg by mouth 3 (three) times daily as needed for cough.    [provider]  Blood Glucose Monitoring Suppl (ONETOUCH VERIO REFLECT) w/Device KIT USE UP TO FOUR TIMES DAILY AS DIRECTED. 11/09/20   Wynn Banker, MD  Cholecalciferol (VITAMIN D) 2000 UNITS CAPS Take 4,000 Units by mouth daily.     [provider]  cyanocobalamin 2000 MCG tablet Take 2,000 mcg by mouth daily.    [provider]  diclofenac Sodium (VOLTAREN) 1 % GEL Apply 2 g topically 4 (four) times daily. 11/13/22   Karie Georges, MD  famotidine (PEPCID) 20 MG tablet Take 1 tablet (20 mg total) by mouth at bedtime. 02/24/23   Karie Georges, MD  FERREX 150 150 MG capsule Take 1 capsule by mouth once daily 08/24/23   Karie Georges, MD   fexofenadine (ALLEGRA) 180 MG tablet Take 180 mg by mouth daily as needed for allergies.    [provider]  fluticasone (FLONASE) 50 MCG/ACT nasal spray Place 1 spray into both nostrils daily. 07/10/23   [place], Betty G, MD  furosemide (LASIX) 20 MG tablet TAKE 1 TABLET BY MOUTH ONCE DAILY AS NEEDED 02/19/23   Karie Georges, MD  glipiZIDE (GLUCOTROL) 5 MG tablet TAKE 1 TABLET BY MOUTH ONCE DAILY BEFORE BREAKFAST 08/18/23   Karie Georges, MD  glucose blood [facility] VERIO) test strip USE 1 STRIP  TO CHECK GLUCOSE 2-4 TIMES DAILY AS NEEDED 08/04/23   Karie Georges, MD  hydrochlorothiazide (HYDRODIURIL) 25 MG tablet Take 1 tablet (25 mg total) by mouth daily. 09/15/23   Karie Georges, MD  HYDROcodone-acetaminophen [facility]) 5-325 MG tablet Take 1 tablet by mouth daily as needed for moderate pain (pain score 4-6). 07/10/23   Karie Georges, MD  Lancets 30G MISC USE TWICE DAILY AS DIRECTED FOR GLUCOSE TESTING AND MONITORING 09/22/23   Karie Georges, MD  Lancets MISC Use as directed 04/23/21   Wynn Banker, MD  metFORMIN (GLUCOPHAGE-XR) 500 MG 24 hr tablet Take 1 tablet (500 mg total) by mouth 2 (two) times daily. 04/27/23   Karie Georges, MD  metoprolol succinate (TOPROL-XL) 50 MG 24 hr tablet Take 1 tablet (50 mg total) by mouth daily. Take with or immediately following a meal. 04/27/23   Karie Georges, MD  ONE [facility] LANCETS MISC Test twice daily. 08/01/14   Gordy Savers, MD  OneTouch Delica Lancets 33G MISC Use as directed once a day 06/11/22   Karie Georges, MD  pantoprazole (PROTONIX) 20 MG tablet Take 1 tablet (20 mg total) by mouth daily. 07/24/23   Karie Georges, MD  pioglitazone (ACTOS) 45 MG tablet TAKE 1 TABLET BY MOUTH ONCE DAILY 04/27/23   Karie Georges, MD  pravastatin (PRAVACHOL) 40 MG tablet Take 1 tablet (40 mg total) by mouth daily. 04/27/23   Karie Georges, MD  prednisoLONE acetate (PRED FORTE) 1 % ophthalmic  suspension Place 1 drop into both eyes See admin instructions. Place 1 drop into left eye three times daily Place 1 drop into right eye once daily 09/14/19   [provider]  Probiotic Product (PROBIOTIC-10 PO) Take 1 capsule by mouth daily.    [provider]  senna-docusate (SENOKOT-S) 8.6-50 MG tablet Take 1 tablet by mouth at bedtime. 10/06/19   Love, Evlyn Kanner, PA-C    Family History Family History  Problem Relation Age of Onset   Diabetes Mother    Hypertension Sister    Asthma Father    Diabetes Brother     Social History Social History   Tobacco Use   Smoking status: Never   Smokeless tobacco: Never  Vaping Use   Vaping status: Never Used  Substance Use Topics   Alcohol use: No   Drug use: No     Allergies   Dust mite extract and Latex   Review of Systems Review of Systems  Constitutional:  Negative for chills and fever.  HENT:  Negative for ear pain and sore throat.   Eyes:  Negative for pain and visual disturbance.  Respiratory:  Negative for cough and shortness of breath.   Cardiovascular:  Negative for chest pain and palpitations.  Gastrointestinal:  Negative for abdominal pain and vomiting.  Genitourinary:  Negative for dysuria and hematuria.  Musculoskeletal:  Negative for arthralgias and back pain.  Skin:  Negative for color change and rash.  Neurological:  Positive for dizziness. Negative for seizures and syncope.  All other systems reviewed and are negative.    Physical Exam Triage Vital Signs ED Triage Vitals [09/24/23 1256]  Encounter Vitals Group     BP (!) 139/90     Systolic BP Percentile      Diastolic BP Percentile      Pulse Rate (!) 104     Resp 20     Temp 98 F (36.7 C)  Temp Source Oral     SpO2 100 %     Weight      Height      Head Circumference      Peak Flow      Pain Score 0     Pain Loc      Pain Education      Exclude from Growth Chart    No data found.  Updated Vital Signs BP (!) 139/90 (BP  Location: Left Arm)   Pulse (!) 104   Temp 98 F (36.7 C) (Oral)   Resp 20   SpO2 100%   Visual Acuity Right Eye Distance:   Left Eye Distance:   Bilateral Distance:    Right Eye Near:   Left Eye Near:    Bilateral Near:     Physical Exam Vitals and nursing note reviewed.  Constitutional:      General: She is not in acute distress.    Appearance: She is well-developed.  HENT:     Head: Normocephalic and atraumatic.     Right Ear: Tympanic membrane normal.     Left Ear: Tympanic membrane normal.     Nose: Nose normal.     Mouth/Throat:     Mouth: Mucous membranes are moist.  Eyes:     Conjunctiva/sclera: Conjunctivae normal.  Cardiovascular:     Rate and Rhythm: Normal rate and regular rhythm.     Heart sounds: No murmur heard. Pulmonary:     Effort: Pulmonary effort is normal. No respiratory distress.     Breath sounds: Normal breath sounds.  Abdominal:     General: Bowel sounds are normal.     Palpations: Abdomen is soft.     Tenderness: There is no abdominal tenderness.  Musculoskeletal:        General: No swelling.     Cervical back: Neck supple.  Skin:    General: Skin is warm and dry.     Capillary Refill: Capillary refill takes less than 2 seconds.  Neurological:     General: No focal deficit present.     Mental Status: She is alert and oriented to person, place, and time.     Cranial Nerves: No cranial nerve deficit.     Sensory: No sensory deficit.     Gait: Gait abnormal (uses a walker).  Psychiatric:        Mood and Affect: Mood normal.      UC Treatments / Results  Labs (all labs ordered are listed, but only abnormal results are displayed) Labs Reviewed  POCT FASTING CBG [facility] MANUAL ENTRY - Abnormal; Notable for the following components:      Result Value   POCT Glucose ([facility]) 198 (*)    All other components within normal limits  POCT URINALYSIS DIP (MANUAL ENTRY) - Abnormal; Notable for the following components:   Blood, UA trace-intact (*)     All other components within normal limits    EKG   Radiology No results found.  Procedures Procedures (including critical care time)  Medications Ordered in UC Medications - No data to display  Initial Impression / Assessment and Plan / UC Course  I have reviewed the triage vital signs and the nursing notes.  Pertinent labs & imaging results that were available during my care of the patient were reviewed by me and considered in my medical decision making (see chart for details).     Dizziness  Dizziness symptoms that have been ongoing for 2 to 3 months  now.  Differential diagnosis could be intermittently low blood pressure, low blood sugar, vertigo, anemia workup today included EKG, urinalysis, CBG and CBC with BMP.  The CBC with BMP are pending and if these are abnormal we will contact the patient.  EKG done today shows sinus rhythm which is normal.  Urinalysis shows no evidence of infectious process.  Blood sugar is 198 but this is not fasting.  Vital signs done here shows blood pressure of 139/90 and mildly elevated heart rate.  Other vital signs are within normal limits.  Reassuringly the symptom from this morning is resolved.  Recommend keeping your follow-up appointment with your primary care doctor tomorrow and if your symptoms worsen go to the emergency room immediately for further evaluation.  Final Clinical Impressions(s) / UC Diagnoses   Final diagnoses:  None   Discharge Instructions   None    ED Prescriptions   None    PDMP not reviewed this encounter.   Landis Martins, [place] 09/24/23 1525

## 2023-09-24 NOTE — Discharge Instructions (Signed)
 EKG done today shows sinus rhythm which is normal.  Urinalysis shows no evidence of infectious process.  Blood sugar is 198 but this is not fasting.  Vital signs done here shows blood pressure of 139/90 and mildly elevated heart rate.  Other vital signs are within normal limits.  Reassuringly the symptom from this morning is resolved.  Recommend keeping your follow-up appointment with your primary care doctor tomorrow and if your symptoms worsen go to the emergency room immediately for further evaluation.

## 2023-09-24 NOTE — ED Triage Notes (Signed)
 Pt c/o dizziness since 07/2023 and has been worse since her b/p meds were increased. States dizziness is worse in the morning when moving around and better when resting. Pt has a PCP appt tomorrow.

## 2023-09-25 ENCOUNTER — Encounter: Payer: Self-pay | Admitting: Family Medicine

## 2023-09-25 ENCOUNTER — Ambulatory Visit (INDEPENDENT_AMBULATORY_CARE_PROVIDER_SITE_OTHER): Admitting: Family Medicine

## 2023-09-25 ENCOUNTER — Encounter: Payer: Self-pay | Admitting: Physician Assistant

## 2023-09-25 VITALS — BP 152/80 | HR 103 | Temp 97.6°F | Ht 62.0 in | Wt 232.5 lb

## 2023-09-25 DIAGNOSIS — Z7984 Long term (current) use of oral hypoglycemic drugs: Secondary | ICD-10-CM | POA: Diagnosis not present

## 2023-09-25 DIAGNOSIS — I10 Essential (primary) hypertension: Secondary | ICD-10-CM

## 2023-09-25 DIAGNOSIS — R319 Hematuria, unspecified: Secondary | ICD-10-CM

## 2023-09-25 DIAGNOSIS — E1129 Type 2 diabetes mellitus with other diabetic kidney complication: Secondary | ICD-10-CM

## 2023-09-25 DIAGNOSIS — R42 Dizziness and giddiness: Secondary | ICD-10-CM

## 2023-09-25 DIAGNOSIS — R109 Unspecified abdominal pain: Secondary | ICD-10-CM

## 2023-09-25 MED ORDER — BLOOD PRESSURE CUFF MISC: 1.0000 | Freq: Every day | 0 refills | Status: AC

## 2023-09-25 MED ORDER — TELMISARTAN 20 MG PO TABS
10.0000 mg | ORAL_TABLET | Freq: Every day | ORAL | 0 refills | Status: DC
Start: 2023-09-25 — End: 2023-11-10

## 2023-09-25 MED ORDER — ONETOUCH VERIO VI STRP: ORAL_STRIP | 0 refills | Status: DC

## 2023-09-25 NOTE — Progress Notes (Signed)
 Established Patient Office Visit  Subjective   Patient ID: Doris Jackson, female    DOB: [birth year]  Age: 83 y.o. MRN: [id number]  Chief Complaint  Patient presents with   Dizziness    X4 days    Pt is here for follow up on her blood pressure and dizziness/ feeling like she is going to pass out. States that she stopped taking the hydrochlorothiazide -- she went back to the 12.5 mg dose. States that when she woke up this morning she was having "spinning" in her head, states that she got shaky and "nervous and tense." States all of this started when she was taken off the benazepril and her blood pressure medication was switched around. States that she has a lot of "nerves" and wants something to take but doesn't want a daily medication to reduce anxiety, just something as needed. We discussed the risks/benefits of medications like hydroxyzine or benzodiazepines however I believe that these would be too high risk for her to take.   States that the doxycycline was making her sick to her stomach so she is only taking 1 per day for the skin abscess, she reports it is getting better but continues to drain whitish fluid.     Current Outpatient Medications  Medication Instructions   acetaminophen (TYLENOL) 500 mg, Every 6 hours PRN   amLODipine (NORVASC) 10 mg, Oral, Daily   benzonatate (TESSALON) 200 mg, 3 times daily PRN   Blood Glucose Monitoring Suppl (ONETOUCH VERIO REFLECT) w/Device KIT USE UP TO FOUR TIMES DAILY AS DIRECTED.   Blood Pressure Monitoring (BLOOD PRESSURE CUFF) MISC 1 each, Does not apply, Daily   cyanocobalamin 2,000 mcg, Daily   diclofenac Sodium (VOLTAREN) 2 g, Topical, 4 times daily   famotidine (PEPCID) 20 mg, Oral, Daily at bedtime   Ferrex 150 150 mg, Oral, Daily   fexofenadine (ALLEGRA) 180 mg, Daily PRN   fluticasone (FLONASE) 50 MCG/ACT nasal spray 1 spray, Each Nare, Daily   furosemide (LASIX) 20 mg, Oral, Daily PRN   glipiZIDE (GLUCOTROL) 5 mg, Oral, Daily  before breakfast   glucose blood (ONETOUCH VERIO) test strip USE 1 STRIP TO CHECK GLUCOSE 2-4 TIMES DAILY AS NEEDED    hydrochlorothiazide (HYDRODIURIL) 25 mg, Oral, Daily   HYDROcodone-acetaminophen (NORCO) 5-325 MG tablet 1 tablet, Oral, Daily PRN   Lancets 30G MISC USE TWICE DAILY AS DIRECTED FOR GLUCOSE TESTING AND MONITORING   Lancets MISC Use as directed   metFORMIN (GLUCOPHAGE-XR) 500 mg, Oral, 2 times daily   metoprolol succinate (TOPROL-XL) 50 mg, Oral, Daily, Take with or immediately following a meal.   ONE TOUCH LANCETS MISC Test twice daily.   OneTouch Delica Lancets 33G MISC Use as directed once a day   pantoprazole (PROTONIX) 20 mg, Oral, Daily   pioglitazone (ACTOS) 45 MG tablet TAKE 1 TABLET BY MOUTH ONCE DAILY   pravastatin (PRAVACHOL) 40 mg, Oral, Daily   prednisoLONE acetate (PRED FORTE) 1 % ophthalmic suspension 1 drop, See admin instructions   Probiotic Product (PROBIOTIC-10 PO) 1 capsule, Daily   senna-docusate (SENOKOT-S) 8.6-50 MG tablet 1 tablet, Oral, Daily at bedtime   telmisartan (MICARDIS) 10 mg, Oral, Daily   vitamin C 1,000 mg, Daily   Vitamin D 4,000 Units, Daily    Patient Active Problem List   Diagnosis Date Noted   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/2022   Anxiety state    Cervical myelopathy (HCC) 09/22/2019   S/P cervical spinal fusion 09/22/2019   GERD (  gastroesophageal reflux disease) 09/14/2019   Spinal stenosis in cervical region 09/14/2019   Numbness and tingling 09/14/2019   Status post cataract extraction and insertion of intraocular lens of left eye 09/04/2019   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/24/2019   Persistent cough 06/06/2019   History of Descemet membrane endothelial keratoplasty (DMEK) 01/04/2019   Status post cataract extraction and insertion of intraocular lens of right eye 01/04/2019   Senile nuclear sclerosis 01/02/2019   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     Objective:     BP (!) 152/80   Pulse (!) 103   Temp 97.6 F (36.4 C) (Oral)   Ht 5\' 2"  (1.575 m)   Wt 232 lb 8 oz (105.5 kg)   SpO2 100%   BMI 42.52 kg/m    Physical Exam Vitals reviewed.  Constitutional:      Appearance: Normal appearance. She is morbidly obese.  Cardiovascular:     Rate and Rhythm: Normal rate and regular rhythm.     Heart sounds: Normal heart sounds.  Pulmonary:     Effort: Pulmonary effort is normal.     Breath sounds: Normal breath sounds. No wheezing.  Neurological:     Mental Status: She is alert and oriented to person, place, and time. Mental status is at baseline.  Psychiatric:        Mood and Affect: Mood is anxious.        Speech: Speech is rapid and pressured.        Behavior: Behavior normal.        Cognition and Memory: Cognition is impaired.      No results found for any visits on 09/25/23.    The ASCVD Risk score (Arnett DK, et al., 2019) failed to calculate for the following reasons:   The 2019 ASCVD risk score is only valid for ages 70 to 56    Assessment & Plan:  Vertigo Unclear etiology -- it could be a side effect to one of her BP meds or some other pathology, I will order carotid US and home health for balance rehab  -     US Carotid Bilateral; Future -     Ambulatory referral to Home Health  Essential hypertension Assessment & Plan: Pt went back to 12.5 mg hydrochlorothiazide due to vertigo symptoms, I counseled the patient on this and the need to keep her BP controlled. We discussed  trying a different ARB to help reduce her blood pressure, she is agreeable. I will start a very low dose of telmisartan to see how she tolerates this at home, will place referral to home health for BP monitoring and physical therapy for the vertigo. She already has an appt with me April 24th scheduled, she will follow up then.  Orders: -     Telmisartan; Take 0.5 tablets (10 mg total) by mouth daily.  Dispense: 45 tablet; Refill: 0 -     Ambulatory referral to Home Health -     Blood Pressure Cuff; 1 each by  Does not apply route daily.  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 I wrote explicit instructions on what to take.  No follow-ups on file.    Karie Georges, MD

## 2023-09-25 NOTE — Patient Instructions (Addendum)
 START 1/2 tablet of the telmisartan (10 mg) daily  CONTINUE 12.5 mg hydrochlorothiazide daily                       50 mg metoprolol daily                      10 mg amlodipine daily  Come back in 1 month - April 24th

## 2023-09-28 ENCOUNTER — Telehealth: Payer: Self-pay | Admitting: Family Medicine

## 2023-09-28 NOTE — Telephone Encounter (Signed)
 Spoke with patient to schedule AWV  She stated she started her new meds yesterday and she is feeling dizzy today.   I offered to transfer to triage for her to talk nurse about what is going on.  Patient declined wanting a call back.  She stated she had an interview and could not talk.

## 2023-09-28 NOTE — Assessment & Plan Note (Addendum)
 Pt went back to 12.5 mg hydrochlorothiazide due to vertigo symptoms, I counseled the patient on this and the need to keep her BP controlled. We discussed trying a different ARB to help reduce her blood pressure, she is agreeable. I will start a very low dose of telmisartan to see how she tolerates this at home, will place referral to home health for BP monitoring and physical therapy for the vertigo. She already has an appt with me April 24th scheduled, she will follow up then.

## 2023-09-29 ENCOUNTER — Telehealth: Payer: Self-pay

## 2023-09-29 DIAGNOSIS — J3089 Other allergic rhinitis: Secondary | ICD-10-CM | POA: Diagnosis not present

## 2023-09-29 DIAGNOSIS — J301 Allergic rhinitis due to pollen: Secondary | ICD-10-CM | POA: Diagnosis not present

## 2023-09-29 DIAGNOSIS — J3081 Allergic rhinitis due to animal (cat) (dog) hair and dander: Secondary | ICD-10-CM | POA: Diagnosis not present

## 2023-09-29 NOTE — Telephone Encounter (Signed)
 Copied from CRM [id number]. Topic: General - Other >> Sep 29, 2023  9:30 AM Doris Jackson wrote: Reason for CRM: Patient returning a call she received from [facility]. Advised of the message Dr.Michael left for her. Patient will continue to take blood pressure medication as previously discussed.

## 2023-09-29 NOTE — Telephone Encounter (Signed)
 Left a message for the patient to return my call.

## 2023-09-29 NOTE — Telephone Encounter (Signed)
 See prior note from call center from [place].

## 2023-09-29 NOTE — Telephone Encounter (Signed)
 Noted.

## 2023-09-29 NOTE — Telephone Encounter (Signed)
 I'm not sure what is causing the dizziness and she has been evaluated multiple times for the same complaint-- she had this before the new medication as well. I have already ordered her a carotid artery duplex -- please place a referral to ENT for vertigo as I suspect that her dizziness is not related to the BP medication -- please tell the patient to continue her medication as we talked about previously-- that I do not think the dizziness is related to the BP medication

## 2023-09-30 ENCOUNTER — Other Ambulatory Visit: Payer: Self-pay | Admitting: Family Medicine

## 2023-09-30 DIAGNOSIS — E1129 Type 2 diabetes mellitus with other diabetic kidney complication: Secondary | ICD-10-CM

## 2023-09-30 DIAGNOSIS — I10 Essential (primary) hypertension: Secondary | ICD-10-CM

## 2023-09-30 NOTE — Telephone Encounter (Unsigned)
 Copied from CRM [id number]. Topic: Clinical - Medication Refill >> Sep 30, 2023  4:01 PM Elizebeth Brooking wrote: Most Recent Primary Care Visit:  Provider: Karie Georges  Department: [facility]  Visit Type: ACUTE  Date: 09/25/2023  Medication: glucose blood (ONETOUCH VERIO) test strip  hydrochlorothiazide (HYDRODIURIL)  ; Patient stated for it tobe 12.5 MG and not the 25 MG   Has the patient contacted their pharmacy? Yes (Agent: If no, request that the patient contact the pharmacy for the refill. If patient does not wish to contact the pharmacy document the reason why and proceed with request.) (Agent: If yes, when and what did the pharmacy advise?)  Is this the correct pharmacy for this prescription? Yes If no, delete pharmacy and type the correct one.  This is the patient's preferred pharmacy:  [facility] [address] [place] - [address] [place] [address] Phone: [phone] Fax: [phone]   Has the prescription been filled recently? No  Is the patient out of the medication? Yes  Has the patient been seen for an appointment in the last year OR does the patient have an upcoming appointment? Yes  Can we respond through MyChart? Yes  Agent: Please be advised that Rx refills may take up to 3 business days. We ask that you follow-up with your pharmacy.

## 2023-10-01 MED ORDER — HYDROCHLOROTHIAZIDE 25 MG PO TABS: 25.0000 mg | ORAL_TABLET | Freq: Every day | ORAL | 1 refills | Status: DC

## 2023-10-01 MED ORDER — ONETOUCH VERIO VI STRP: ORAL_STRIP | 0 refills | Status: DC

## 2023-10-05 ENCOUNTER — Ambulatory Visit
Admission: RE | Admit: 2023-10-05 | Discharge: 2023-10-05 | Disposition: A | Discharge status: Home / Self Care | Source: Ambulatory Visit | Attending: Family Medicine | Admitting: Family Medicine

## 2023-10-05 DIAGNOSIS — R42 Dizziness and giddiness: Secondary | ICD-10-CM | POA: Diagnosis not present

## 2023-10-06 DIAGNOSIS — Z853 Personal history of malignant neoplasm of breast: Secondary | ICD-10-CM | POA: Diagnosis not present

## 2023-10-06 DIAGNOSIS — M4802 Spinal stenosis, cervical region: Secondary | ICD-10-CM | POA: Diagnosis not present

## 2023-10-06 DIAGNOSIS — Z7984 Long term (current) use of oral hypoglycemic drugs: Secondary | ICD-10-CM | POA: Diagnosis not present

## 2023-10-06 DIAGNOSIS — F411 Generalized anxiety disorder: Secondary | ICD-10-CM | POA: Diagnosis not present

## 2023-10-06 DIAGNOSIS — Z7951 Long term (current) use of inhaled steroids: Secondary | ICD-10-CM | POA: Diagnosis not present

## 2023-10-06 DIAGNOSIS — N1832 Chronic kidney disease, stage 3b: Secondary | ICD-10-CM | POA: Diagnosis not present

## 2023-10-06 DIAGNOSIS — G959 Disease of spinal cord, unspecified: Secondary | ICD-10-CM | POA: Diagnosis not present

## 2023-10-06 DIAGNOSIS — J309 Allergic rhinitis, unspecified: Secondary | ICD-10-CM | POA: Diagnosis not present

## 2023-10-06 DIAGNOSIS — E785 Hyperlipidemia, unspecified: Secondary | ICD-10-CM | POA: Diagnosis not present

## 2023-10-06 DIAGNOSIS — K219 Gastro-esophageal reflux disease without esophagitis: Secondary | ICD-10-CM | POA: Diagnosis not present

## 2023-10-06 DIAGNOSIS — I129 Hypertensive chronic kidney disease with stage 1 through stage 4 chronic kidney disease, or unspecified chronic kidney disease: Secondary | ICD-10-CM | POA: Diagnosis not present

## 2023-10-06 DIAGNOSIS — M48061 Spinal stenosis, lumbar region without neurogenic claudication: Secondary | ICD-10-CM | POA: Diagnosis not present

## 2023-10-06 DIAGNOSIS — D509 Iron deficiency anemia, unspecified: Secondary | ICD-10-CM | POA: Diagnosis not present

## 2023-10-06 DIAGNOSIS — M1990 Unspecified osteoarthritis, unspecified site: Secondary | ICD-10-CM | POA: Diagnosis not present

## 2023-10-06 DIAGNOSIS — Z6841 Body Mass Index (BMI) 40.0 and over, adult: Secondary | ICD-10-CM | POA: Diagnosis not present

## 2023-10-06 DIAGNOSIS — Z9181 History of falling: Secondary | ICD-10-CM | POA: Diagnosis not present

## 2023-10-06 DIAGNOSIS — E1122 Type 2 diabetes mellitus with diabetic chronic kidney disease: Secondary | ICD-10-CM | POA: Diagnosis not present

## 2023-10-06 DIAGNOSIS — N611 Abscess of the breast and nipple: Secondary | ICD-10-CM | POA: Diagnosis not present

## 2023-10-06 DIAGNOSIS — Z556 Problems related to health literacy: Secondary | ICD-10-CM | POA: Diagnosis not present

## 2023-10-06 NOTE — Progress Notes (Signed)
 Hydrocortisone cream OTC -- it's likely from the adhesive in the bandages

## 2023-10-08 ENCOUNTER — Telehealth: Payer: Self-pay | Admitting: *Deleted

## 2023-10-08 DIAGNOSIS — G959 Disease of spinal cord, unspecified: Secondary | ICD-10-CM | POA: Diagnosis not present

## 2023-10-08 DIAGNOSIS — Z6841 Body Mass Index (BMI) 40.0 and over, adult: Secondary | ICD-10-CM | POA: Diagnosis not present

## 2023-10-08 DIAGNOSIS — Z853 Personal history of malignant neoplasm of breast: Secondary | ICD-10-CM | POA: Diagnosis not present

## 2023-10-08 DIAGNOSIS — F411 Generalized anxiety disorder: Secondary | ICD-10-CM | POA: Diagnosis not present

## 2023-10-08 DIAGNOSIS — J309 Allergic rhinitis, unspecified: Secondary | ICD-10-CM | POA: Diagnosis not present

## 2023-10-08 DIAGNOSIS — N1832 Chronic kidney disease, stage 3b: Secondary | ICD-10-CM | POA: Diagnosis not present

## 2023-10-08 DIAGNOSIS — M4802 Spinal stenosis, cervical region: Secondary | ICD-10-CM | POA: Diagnosis not present

## 2023-10-08 DIAGNOSIS — Z9181 History of falling: Secondary | ICD-10-CM | POA: Diagnosis not present

## 2023-10-08 DIAGNOSIS — D509 Iron deficiency anemia, unspecified: Secondary | ICD-10-CM | POA: Diagnosis not present

## 2023-10-08 DIAGNOSIS — Z556 Problems related to health literacy: Secondary | ICD-10-CM | POA: Diagnosis not present

## 2023-10-08 DIAGNOSIS — M1990 Unspecified osteoarthritis, unspecified site: Secondary | ICD-10-CM | POA: Diagnosis not present

## 2023-10-08 DIAGNOSIS — M48061 Spinal stenosis, lumbar region without neurogenic claudication: Secondary | ICD-10-CM | POA: Diagnosis not present

## 2023-10-08 DIAGNOSIS — E785 Hyperlipidemia, unspecified: Secondary | ICD-10-CM | POA: Diagnosis not present

## 2023-10-08 DIAGNOSIS — E1122 Type 2 diabetes mellitus with diabetic chronic kidney disease: Secondary | ICD-10-CM | POA: Diagnosis not present

## 2023-10-08 DIAGNOSIS — N611 Abscess of the breast and nipple: Secondary | ICD-10-CM | POA: Diagnosis not present

## 2023-10-08 DIAGNOSIS — Z7984 Long term (current) use of oral hypoglycemic drugs: Secondary | ICD-10-CM | POA: Diagnosis not present

## 2023-10-08 DIAGNOSIS — Z7951 Long term (current) use of inhaled steroids: Secondary | ICD-10-CM | POA: Diagnosis not present

## 2023-10-08 DIAGNOSIS — K219 Gastro-esophageal reflux disease without esophagitis: Secondary | ICD-10-CM | POA: Diagnosis not present

## 2023-10-08 DIAGNOSIS — I129 Hypertensive chronic kidney disease with stage 1 through stage 4 chronic kidney disease, or unspecified chronic kidney disease: Secondary | ICD-10-CM | POA: Diagnosis not present

## 2023-10-08 NOTE — Telephone Encounter (Signed)
 Copied from CRM [id number]. Topic: Referral - Status >> Oct 08, 2023  1:50 PM Truddie Crumble wrote: Reason for CRM: patient called wanting to know the status of her vertigo referral because no one has called her yet

## 2023-10-09 DIAGNOSIS — Z9181 History of falling: Secondary | ICD-10-CM | POA: Diagnosis not present

## 2023-10-09 DIAGNOSIS — Z6841 Body Mass Index (BMI) 40.0 and over, adult: Secondary | ICD-10-CM | POA: Diagnosis not present

## 2023-10-09 DIAGNOSIS — E1122 Type 2 diabetes mellitus with diabetic chronic kidney disease: Secondary | ICD-10-CM | POA: Diagnosis not present

## 2023-10-09 DIAGNOSIS — M48061 Spinal stenosis, lumbar region without neurogenic claudication: Secondary | ICD-10-CM | POA: Diagnosis not present

## 2023-10-09 DIAGNOSIS — I129 Hypertensive chronic kidney disease with stage 1 through stage 4 chronic kidney disease, or unspecified chronic kidney disease: Secondary | ICD-10-CM | POA: Diagnosis not present

## 2023-10-09 DIAGNOSIS — N611 Abscess of the breast and nipple: Secondary | ICD-10-CM | POA: Diagnosis not present

## 2023-10-09 DIAGNOSIS — Z7984 Long term (current) use of oral hypoglycemic drugs: Secondary | ICD-10-CM | POA: Diagnosis not present

## 2023-10-09 DIAGNOSIS — G959 Disease of spinal cord, unspecified: Secondary | ICD-10-CM | POA: Diagnosis not present

## 2023-10-09 DIAGNOSIS — J309 Allergic rhinitis, unspecified: Secondary | ICD-10-CM | POA: Diagnosis not present

## 2023-10-09 DIAGNOSIS — E785 Hyperlipidemia, unspecified: Secondary | ICD-10-CM | POA: Diagnosis not present

## 2023-10-09 DIAGNOSIS — Z853 Personal history of malignant neoplasm of breast: Secondary | ICD-10-CM | POA: Diagnosis not present

## 2023-10-09 DIAGNOSIS — Z7951 Long term (current) use of inhaled steroids: Secondary | ICD-10-CM | POA: Diagnosis not present

## 2023-10-09 DIAGNOSIS — M4802 Spinal stenosis, cervical region: Secondary | ICD-10-CM | POA: Diagnosis not present

## 2023-10-09 DIAGNOSIS — Z556 Problems related to health literacy: Secondary | ICD-10-CM | POA: Diagnosis not present

## 2023-10-09 DIAGNOSIS — D509 Iron deficiency anemia, unspecified: Secondary | ICD-10-CM | POA: Diagnosis not present

## 2023-10-09 DIAGNOSIS — K219 Gastro-esophageal reflux disease without esophagitis: Secondary | ICD-10-CM | POA: Diagnosis not present

## 2023-10-09 DIAGNOSIS — M1990 Unspecified osteoarthritis, unspecified site: Secondary | ICD-10-CM | POA: Diagnosis not present

## 2023-10-09 DIAGNOSIS — F411 Generalized anxiety disorder: Secondary | ICD-10-CM | POA: Diagnosis not present

## 2023-10-09 DIAGNOSIS — N1832 Chronic kidney disease, stage 3b: Secondary | ICD-10-CM | POA: Diagnosis not present

## 2023-10-13 ENCOUNTER — Telehealth: Payer: Self-pay | Admitting: *Deleted

## 2023-10-13 DIAGNOSIS — Z853 Personal history of malignant neoplasm of breast: Secondary | ICD-10-CM | POA: Diagnosis not present

## 2023-10-13 DIAGNOSIS — J3081 Allergic rhinitis due to animal (cat) (dog) hair and dander: Secondary | ICD-10-CM | POA: Diagnosis not present

## 2023-10-13 DIAGNOSIS — J3089 Other allergic rhinitis: Secondary | ICD-10-CM | POA: Diagnosis not present

## 2023-10-13 DIAGNOSIS — F411 Generalized anxiety disorder: Secondary | ICD-10-CM | POA: Diagnosis not present

## 2023-10-13 DIAGNOSIS — M1990 Unspecified osteoarthritis, unspecified site: Secondary | ICD-10-CM | POA: Diagnosis not present

## 2023-10-13 DIAGNOSIS — N1832 Chronic kidney disease, stage 3b: Secondary | ICD-10-CM | POA: Diagnosis not present

## 2023-10-13 DIAGNOSIS — J301 Allergic rhinitis due to pollen: Secondary | ICD-10-CM | POA: Diagnosis not present

## 2023-10-13 DIAGNOSIS — N611 Abscess of the breast and nipple: Secondary | ICD-10-CM | POA: Diagnosis not present

## 2023-10-13 DIAGNOSIS — G959 Disease of spinal cord, unspecified: Secondary | ICD-10-CM | POA: Diagnosis not present

## 2023-10-13 DIAGNOSIS — M48061 Spinal stenosis, lumbar region without neurogenic claudication: Secondary | ICD-10-CM | POA: Diagnosis not present

## 2023-10-13 DIAGNOSIS — M4802 Spinal stenosis, cervical region: Secondary | ICD-10-CM | POA: Diagnosis not present

## 2023-10-13 DIAGNOSIS — E1122 Type 2 diabetes mellitus with diabetic chronic kidney disease: Secondary | ICD-10-CM | POA: Diagnosis not present

## 2023-10-13 DIAGNOSIS — D509 Iron deficiency anemia, unspecified: Secondary | ICD-10-CM | POA: Diagnosis not present

## 2023-10-13 DIAGNOSIS — Z556 Problems related to health literacy: Secondary | ICD-10-CM | POA: Diagnosis not present

## 2023-10-13 DIAGNOSIS — K219 Gastro-esophageal reflux disease without esophagitis: Secondary | ICD-10-CM | POA: Diagnosis not present

## 2023-10-13 DIAGNOSIS — J309 Allergic rhinitis, unspecified: Secondary | ICD-10-CM | POA: Diagnosis not present

## 2023-10-13 DIAGNOSIS — Z9181 History of falling: Secondary | ICD-10-CM | POA: Diagnosis not present

## 2023-10-13 DIAGNOSIS — Z7984 Long term (current) use of oral hypoglycemic drugs: Secondary | ICD-10-CM | POA: Diagnosis not present

## 2023-10-13 DIAGNOSIS — I129 Hypertensive chronic kidney disease with stage 1 through stage 4 chronic kidney disease, or unspecified chronic kidney disease: Secondary | ICD-10-CM | POA: Diagnosis not present

## 2023-10-13 DIAGNOSIS — E785 Hyperlipidemia, unspecified: Secondary | ICD-10-CM | POA: Diagnosis not present

## 2023-10-13 DIAGNOSIS — Z6841 Body Mass Index (BMI) 40.0 and over, adult: Secondary | ICD-10-CM | POA: Diagnosis not present

## 2023-10-13 DIAGNOSIS — Z7951 Long term (current) use of inhaled steroids: Secondary | ICD-10-CM | POA: Diagnosis not present

## 2023-10-13 NOTE — Telephone Encounter (Signed)
 Copied from CRM [id number]. Topic: General - Other >> Oct 13, 2023 12:11 PM Allyne Areola wrote: Reason for CRM: Kaitlyn from [facility] home health is calling to verify if orders that were faxed to the office have been received. Order number [id number] & [phone], best call back number [phone].

## 2023-10-14 ENCOUNTER — Ambulatory Visit
Admission: RE | Admit: 2023-10-14 | Discharge: 2023-10-14 | Disposition: A | Discharge status: Home / Self Care | Source: Ambulatory Visit | Attending: Family Medicine | Admitting: Family Medicine

## 2023-10-14 DIAGNOSIS — R319 Hematuria, unspecified: Secondary | ICD-10-CM

## 2023-10-15 DIAGNOSIS — N611 Abscess of the breast and nipple: Secondary | ICD-10-CM | POA: Diagnosis not present

## 2023-10-15 DIAGNOSIS — E785 Hyperlipidemia, unspecified: Secondary | ICD-10-CM | POA: Diagnosis not present

## 2023-10-15 DIAGNOSIS — J309 Allergic rhinitis, unspecified: Secondary | ICD-10-CM | POA: Diagnosis not present

## 2023-10-15 DIAGNOSIS — Z853 Personal history of malignant neoplasm of breast: Secondary | ICD-10-CM | POA: Diagnosis not present

## 2023-10-15 DIAGNOSIS — N1832 Chronic kidney disease, stage 3b: Secondary | ICD-10-CM | POA: Diagnosis not present

## 2023-10-15 DIAGNOSIS — M4802 Spinal stenosis, cervical region: Secondary | ICD-10-CM | POA: Diagnosis not present

## 2023-10-15 DIAGNOSIS — Z6841 Body Mass Index (BMI) 40.0 and over, adult: Secondary | ICD-10-CM | POA: Diagnosis not present

## 2023-10-15 DIAGNOSIS — Z9181 History of falling: Secondary | ICD-10-CM | POA: Diagnosis not present

## 2023-10-15 DIAGNOSIS — K219 Gastro-esophageal reflux disease without esophagitis: Secondary | ICD-10-CM | POA: Diagnosis not present

## 2023-10-15 DIAGNOSIS — Z7951 Long term (current) use of inhaled steroids: Secondary | ICD-10-CM | POA: Diagnosis not present

## 2023-10-15 DIAGNOSIS — M48061 Spinal stenosis, lumbar region without neurogenic claudication: Secondary | ICD-10-CM | POA: Diagnosis not present

## 2023-10-15 DIAGNOSIS — E1122 Type 2 diabetes mellitus with diabetic chronic kidney disease: Secondary | ICD-10-CM | POA: Diagnosis not present

## 2023-10-15 DIAGNOSIS — I129 Hypertensive chronic kidney disease with stage 1 through stage 4 chronic kidney disease, or unspecified chronic kidney disease: Secondary | ICD-10-CM | POA: Diagnosis not present

## 2023-10-15 DIAGNOSIS — Z7984 Long term (current) use of oral hypoglycemic drugs: Secondary | ICD-10-CM | POA: Diagnosis not present

## 2023-10-15 DIAGNOSIS — F411 Generalized anxiety disorder: Secondary | ICD-10-CM | POA: Diagnosis not present

## 2023-10-15 DIAGNOSIS — G959 Disease of spinal cord, unspecified: Secondary | ICD-10-CM | POA: Diagnosis not present

## 2023-10-15 DIAGNOSIS — D509 Iron deficiency anemia, unspecified: Secondary | ICD-10-CM | POA: Diagnosis not present

## 2023-10-15 DIAGNOSIS — M1990 Unspecified osteoarthritis, unspecified site: Secondary | ICD-10-CM | POA: Diagnosis not present

## 2023-10-15 DIAGNOSIS — Z556 Problems related to health literacy: Secondary | ICD-10-CM | POA: Diagnosis not present

## 2023-10-18 ENCOUNTER — Other Ambulatory Visit: Payer: Self-pay | Admitting: Family Medicine

## 2023-10-18 DIAGNOSIS — E1129 Type 2 diabetes mellitus with other diabetic kidney complication: Secondary | ICD-10-CM

## 2023-10-19 DIAGNOSIS — Z556 Problems related to health literacy: Secondary | ICD-10-CM | POA: Diagnosis not present

## 2023-10-19 DIAGNOSIS — Z6841 Body Mass Index (BMI) 40.0 and over, adult: Secondary | ICD-10-CM | POA: Diagnosis not present

## 2023-10-19 DIAGNOSIS — J309 Allergic rhinitis, unspecified: Secondary | ICD-10-CM | POA: Diagnosis not present

## 2023-10-19 DIAGNOSIS — K219 Gastro-esophageal reflux disease without esophagitis: Secondary | ICD-10-CM | POA: Diagnosis not present

## 2023-10-19 DIAGNOSIS — M1990 Unspecified osteoarthritis, unspecified site: Secondary | ICD-10-CM | POA: Diagnosis not present

## 2023-10-19 DIAGNOSIS — M4802 Spinal stenosis, cervical region: Secondary | ICD-10-CM | POA: Diagnosis not present

## 2023-10-19 DIAGNOSIS — F411 Generalized anxiety disorder: Secondary | ICD-10-CM | POA: Diagnosis not present

## 2023-10-19 DIAGNOSIS — Z853 Personal history of malignant neoplasm of breast: Secondary | ICD-10-CM | POA: Diagnosis not present

## 2023-10-19 DIAGNOSIS — D509 Iron deficiency anemia, unspecified: Secondary | ICD-10-CM | POA: Diagnosis not present

## 2023-10-19 DIAGNOSIS — I129 Hypertensive chronic kidney disease with stage 1 through stage 4 chronic kidney disease, or unspecified chronic kidney disease: Secondary | ICD-10-CM | POA: Diagnosis not present

## 2023-10-19 DIAGNOSIS — E785 Hyperlipidemia, unspecified: Secondary | ICD-10-CM | POA: Diagnosis not present

## 2023-10-19 DIAGNOSIS — Z7951 Long term (current) use of inhaled steroids: Secondary | ICD-10-CM | POA: Diagnosis not present

## 2023-10-19 DIAGNOSIS — E1122 Type 2 diabetes mellitus with diabetic chronic kidney disease: Secondary | ICD-10-CM | POA: Diagnosis not present

## 2023-10-19 DIAGNOSIS — G959 Disease of spinal cord, unspecified: Secondary | ICD-10-CM | POA: Diagnosis not present

## 2023-10-19 DIAGNOSIS — M48061 Spinal stenosis, lumbar region without neurogenic claudication: Secondary | ICD-10-CM | POA: Diagnosis not present

## 2023-10-19 DIAGNOSIS — N1832 Chronic kidney disease, stage 3b: Secondary | ICD-10-CM | POA: Diagnosis not present

## 2023-10-19 DIAGNOSIS — N611 Abscess of the breast and nipple: Secondary | ICD-10-CM | POA: Diagnosis not present

## 2023-10-19 DIAGNOSIS — Z9181 History of falling: Secondary | ICD-10-CM | POA: Diagnosis not present

## 2023-10-19 DIAGNOSIS — Z7984 Long term (current) use of oral hypoglycemic drugs: Secondary | ICD-10-CM | POA: Diagnosis not present

## 2023-10-20 DIAGNOSIS — Z853 Personal history of malignant neoplasm of breast: Secondary | ICD-10-CM | POA: Diagnosis not present

## 2023-10-20 DIAGNOSIS — E785 Hyperlipidemia, unspecified: Secondary | ICD-10-CM | POA: Diagnosis not present

## 2023-10-20 DIAGNOSIS — Z7951 Long term (current) use of inhaled steroids: Secondary | ICD-10-CM | POA: Diagnosis not present

## 2023-10-20 DIAGNOSIS — I129 Hypertensive chronic kidney disease with stage 1 through stage 4 chronic kidney disease, or unspecified chronic kidney disease: Secondary | ICD-10-CM | POA: Diagnosis not present

## 2023-10-20 DIAGNOSIS — Z7984 Long term (current) use of oral hypoglycemic drugs: Secondary | ICD-10-CM | POA: Diagnosis not present

## 2023-10-20 DIAGNOSIS — J309 Allergic rhinitis, unspecified: Secondary | ICD-10-CM | POA: Diagnosis not present

## 2023-10-20 DIAGNOSIS — D509 Iron deficiency anemia, unspecified: Secondary | ICD-10-CM | POA: Diagnosis not present

## 2023-10-20 DIAGNOSIS — E1122 Type 2 diabetes mellitus with diabetic chronic kidney disease: Secondary | ICD-10-CM | POA: Diagnosis not present

## 2023-10-20 DIAGNOSIS — N1832 Chronic kidney disease, stage 3b: Secondary | ICD-10-CM | POA: Diagnosis not present

## 2023-10-20 DIAGNOSIS — G959 Disease of spinal cord, unspecified: Secondary | ICD-10-CM | POA: Diagnosis not present

## 2023-10-20 DIAGNOSIS — N611 Abscess of the breast and nipple: Secondary | ICD-10-CM | POA: Diagnosis not present

## 2023-10-20 DIAGNOSIS — Z6841 Body Mass Index (BMI) 40.0 and over, adult: Secondary | ICD-10-CM | POA: Diagnosis not present

## 2023-10-20 DIAGNOSIS — M48061 Spinal stenosis, lumbar region without neurogenic claudication: Secondary | ICD-10-CM | POA: Diagnosis not present

## 2023-10-20 DIAGNOSIS — M4802 Spinal stenosis, cervical region: Secondary | ICD-10-CM | POA: Diagnosis not present

## 2023-10-20 DIAGNOSIS — F411 Generalized anxiety disorder: Secondary | ICD-10-CM | POA: Diagnosis not present

## 2023-10-20 DIAGNOSIS — K219 Gastro-esophageal reflux disease without esophagitis: Secondary | ICD-10-CM | POA: Diagnosis not present

## 2023-10-20 DIAGNOSIS — Z556 Problems related to health literacy: Secondary | ICD-10-CM | POA: Diagnosis not present

## 2023-10-20 DIAGNOSIS — M1990 Unspecified osteoarthritis, unspecified site: Secondary | ICD-10-CM | POA: Diagnosis not present

## 2023-10-20 DIAGNOSIS — Z9181 History of falling: Secondary | ICD-10-CM | POA: Diagnosis not present

## 2023-10-21 ENCOUNTER — Telehealth: Payer: Self-pay | Admitting: Family Medicine

## 2023-10-21 NOTE — Telephone Encounter (Signed)
 Copied from CRM [id number]. Topic: General - Other >> Oct 21, 2023  2:14 PM Doris Jackson wrote: Reason for CRM: [name] from [facility] stating a DME request was sent to us  and will re-fax.  Call back: [phone]. [phone]

## 2023-10-22 ENCOUNTER — Ambulatory Visit: Admitting: Family Medicine

## 2023-10-22 ENCOUNTER — Telehealth: Payer: Self-pay | Admitting: *Deleted

## 2023-10-22 ENCOUNTER — Encounter: Payer: Self-pay | Admitting: Family Medicine

## 2023-10-22 VITALS — BP 136/68 | HR 85 | Temp 97.6°F | Ht 62.0 in | Wt 236.1 lb

## 2023-10-22 DIAGNOSIS — Z9181 History of falling: Secondary | ICD-10-CM | POA: Diagnosis not present

## 2023-10-22 DIAGNOSIS — E611 Iron deficiency: Secondary | ICD-10-CM

## 2023-10-22 DIAGNOSIS — M1990 Unspecified osteoarthritis, unspecified site: Secondary | ICD-10-CM | POA: Diagnosis not present

## 2023-10-22 DIAGNOSIS — G959 Disease of spinal cord, unspecified: Secondary | ICD-10-CM | POA: Diagnosis not present

## 2023-10-22 DIAGNOSIS — N1832 Chronic kidney disease, stage 3b: Secondary | ICD-10-CM | POA: Diagnosis not present

## 2023-10-22 DIAGNOSIS — Z7984 Long term (current) use of oral hypoglycemic drugs: Secondary | ICD-10-CM | POA: Diagnosis not present

## 2023-10-22 DIAGNOSIS — E1129 Type 2 diabetes mellitus with other diabetic kidney complication: Secondary | ICD-10-CM | POA: Diagnosis not present

## 2023-10-22 DIAGNOSIS — K219 Gastro-esophageal reflux disease without esophagitis: Secondary | ICD-10-CM | POA: Diagnosis not present

## 2023-10-22 DIAGNOSIS — M4802 Spinal stenosis, cervical region: Secondary | ICD-10-CM | POA: Diagnosis not present

## 2023-10-22 DIAGNOSIS — Z6841 Body Mass Index (BMI) 40.0 and over, adult: Secondary | ICD-10-CM | POA: Diagnosis not present

## 2023-10-22 DIAGNOSIS — E1122 Type 2 diabetes mellitus with diabetic chronic kidney disease: Secondary | ICD-10-CM | POA: Diagnosis not present

## 2023-10-22 DIAGNOSIS — I129 Hypertensive chronic kidney disease with stage 1 through stage 4 chronic kidney disease, or unspecified chronic kidney disease: Secondary | ICD-10-CM | POA: Diagnosis not present

## 2023-10-22 DIAGNOSIS — Z7951 Long term (current) use of inhaled steroids: Secondary | ICD-10-CM | POA: Diagnosis not present

## 2023-10-22 DIAGNOSIS — M48061 Spinal stenosis, lumbar region without neurogenic claudication: Secondary | ICD-10-CM | POA: Diagnosis not present

## 2023-10-22 DIAGNOSIS — N611 Abscess of the breast and nipple: Secondary | ICD-10-CM | POA: Diagnosis not present

## 2023-10-22 DIAGNOSIS — I10 Essential (primary) hypertension: Secondary | ICD-10-CM

## 2023-10-22 DIAGNOSIS — J309 Allergic rhinitis, unspecified: Secondary | ICD-10-CM | POA: Diagnosis not present

## 2023-10-22 DIAGNOSIS — Z853 Personal history of malignant neoplasm of breast: Secondary | ICD-10-CM | POA: Diagnosis not present

## 2023-10-22 DIAGNOSIS — Z556 Problems related to health literacy: Secondary | ICD-10-CM | POA: Diagnosis not present

## 2023-10-22 DIAGNOSIS — E785 Hyperlipidemia, unspecified: Secondary | ICD-10-CM | POA: Diagnosis not present

## 2023-10-22 DIAGNOSIS — F411 Generalized anxiety disorder: Secondary | ICD-10-CM | POA: Diagnosis not present

## 2023-10-22 DIAGNOSIS — D509 Iron deficiency anemia, unspecified: Secondary | ICD-10-CM | POA: Diagnosis not present

## 2023-10-22 LAB — POCT GLYCOSYLATED HEMOGLOBIN (HGB A1C): Hemoglobin A1C: 7.1 % — AB (ref 4.0–5.6)

## 2023-10-22 MED ORDER — METOPROLOL SUCCINATE ER 50 MG PO TB24: 50.0000 mg | ORAL_TABLET | Freq: Every day | ORAL | 1 refills | Status: DC

## 2023-10-22 MED ORDER — POLYSACCHARIDE IRON COMPLEX 150 MG PO CAPS: 150.0000 mg | ORAL_CAPSULE | Freq: Every day | ORAL | 1 refills | Status: AC

## 2023-10-22 MED ORDER — GLIPIZIDE 5 MG PO TABS
5.0000 mg | ORAL_TABLET | Freq: Every day | ORAL | 1 refills | Status: DC
Start: 2023-10-22 — End: 2024-04-04

## 2023-10-22 MED ORDER — PIOGLITAZONE HCL 45 MG PO TABS: ORAL_TABLET | ORAL | 1 refills | Status: DC

## 2023-10-22 NOTE — Telephone Encounter (Signed)
 Per PCP, I called [facility] reading room at [phone], spoke with Ray and requested the CT abdomen from 4/16 be read.  Ray stated he will enter a note with the request.

## 2023-10-22 NOTE — Progress Notes (Signed)
 Established Patient Office Visit  Subjective   Patient ID: Doris Jackson, female    DOB: [birth year]  Age: 83 y.o. MRN: [id number]  Chief Complaint  Patient presents with   Medical Management of Chronic Issues    Pt is here for BP follow up today. She reports that the physical therapist is coming to her home-- thinks that it is getting a little better-- they told her that she had vertigo and they are doing exercises.   DM-- A1C performed in office today and is 7.1, slightly higher than previous, she states she needs to move more. Reviewed her medications   HTN-- pt reports that she is getting 130's at home, she had an occasional one in the 140's. She reports she is feeling much better on the current medication regimen. I went over it again verbally and also went over her medication list on paper as well to ensure understanding. Pt states she doesn't know why she gets into these "panic states". Thinks she needs something for her nerves when it happens. We discussed possible treatments for her anxiety.    Current Outpatient Medications  Medication Instructions   acetaminophen  (TYLENOL ) 500 mg, Every 6 hours PRN   amLODipine  (NORVASC ) 10 mg, Oral, Daily   benzonatate  (TESSALON ) 200 mg, 3 times daily PRN   Blood Glucose Monitoring Suppl (ONETOUCH VERIO REFLECT) w/Device KIT USE UP TO FOUR TIMES DAILY AS DIRECTED.   Blood Pressure Monitoring (BLOOD PRESSURE CUFF) MISC 1 each, Does not apply, Daily   cyanocobalamin  2,000 mcg, Daily   diclofenac  Sodium (VOLTAREN ) 2 g, Topical, 4 times daily   famotidine  (PEPCID ) 20 mg, Oral, Daily at bedtime   fexofenadine (ALLEGRA) 180 mg, Daily PRN   fluticasone  (FLONASE ) 50 MCG/ACT nasal spray 1 spray, Each Nare, Daily   furosemide  (LASIX ) 20 mg, Oral, Daily PRN   glipiZIDE  (GLUCOTROL ) 5 mg, Oral, Daily before breakfast   glucose blood (ONETOUCH VERIO) test strip USE 1 STRIP TO CHECK GLUCOSE 2-4 TIMES DAILY AS NEEDED    hydrochlorothiazide   (HYDRODIURIL ) 25 mg, Oral, Daily   HYDROcodone -acetaminophen  (NORCO) 5-325 MG tablet 1 tablet, Oral, Daily PRN   iron  polysaccharides (FERREX 150) 150 mg, Oral, Daily   Lancets 30G MISC USE TWICE DAILY AS DIRECTED FOR GLUCOSE TESTING AND MONITORING   Lancets MISC Use as directed   metFORMIN  (GLUCOPHAGE -XR) 500 mg, Oral, 2 times daily   metoprolol  succinate (TOPROL -XL) 50 mg, Oral, Daily, Take with or immediately following a meal.   ONE TOUCH LANCETS MISC Test twice daily.   OneTouch Delica Lancets 33G MISC Use as directed once a day   pantoprazole  (PROTONIX ) 20 mg, Oral, Daily   pioglitazone  (ACTOS ) 45 MG tablet TAKE 1 TABLET BY MOUTH ONCE DAILY   pravastatin  (PRAVACHOL ) 40 mg, Oral, Daily   prednisoLONE  acetate (PRED FORTE ) 1 % ophthalmic suspension 1 drop, See admin instructions   Probiotic Product (PROBIOTIC-10 PO) 1 capsule, Daily   senna-docusate (SENOKOT-S) 8.6-50 MG tablet 1 tablet, Oral, Daily at bedtime   telmisartan  (MICARDIS ) 10 mg, Oral, Daily   vitamin C 1,000 mg, Daily   Vitamin D  4,000 Units, Daily    Patient Active Problem List   Diagnosis Date Noted   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/2022   Anxiety state    Cervical myelopathy (HCC) 09/22/2019   S/P cervical spinal fusion 09/22/2019   GERD (gastroesophageal reflux disease) 09/14/2019   Spinal stenosis in cervical region 09/14/2019   Numbness and tingling 09/14/2019   Status post  cataract extraction and insertion of intraocular lens of left eye 09/04/2019   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/24/2019   Persistent cough 06/06/2019   History of Descemet membrane endothelial keratoplasty (DMEK) 01/04/2019   Status post cataract extraction and insertion of intraocular lens of right eye 01/04/2019   Senile nuclear sclerosis 01/02/2019   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   Anatomical narrow angle, 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   Obesity, morbid, BMI 40.0-49.9 (HCC) 04/19/2018   Cataract    DM (diabetes  mellitus) type II controlled with renal manifestation (HCC) 06/17/2010   Dyslipidemia 06/17/2010   Microcytic anemia 06/17/2010   Essential hypertension 06/17/2010   Allergic rhinitis 06/17/2010   Osteoarthritis 06/17/2010   LOW BACK PAIN 06/17/2010   BREAST CANCER, HX OF 06/17/2010      Review of Systems  All other systems reviewed and are negative.     Objective:     BP 136/68   Pulse 85   Temp 97.6 F (36.4 C) (Oral)   Ht 5\' 2"  (1.575 m)   Wt 236 lb 1.6 oz (107.1 kg)   SpO2 94%   BMI 43.18 kg/m    Physical Exam Vitals reviewed.  Constitutional:      Appearance: Normal appearance. She is well-groomed. She is morbidly obese.  Cardiovascular:     Rate and Rhythm: Normal rate and regular rhythm.     Heart sounds: S1 normal and S2 normal. No murmur heard. Pulmonary:     Effort: Pulmonary effort is normal.     Breath sounds: Normal breath sounds and air entry.  Musculoskeletal:     Right lower leg: Edema (1+ BL ankles) present.     Left lower leg: Edema present.  Neurological:     Mental Status: She is alert and oriented to person, place, and time. Mental status is at baseline.     Gait: Gait is intact.  Psychiatric:        Mood and Affect: Mood and affect normal.        Speech: Speech normal.        Behavior: Behavior normal.        Judgment: Judgment normal.      Results for orders placed or performed in visit on 10/22/23  POC HgB A1c  Result Value Ref Range   Hemoglobin A1C 7.1 (A) 4.0 - 5.6 %   HbA1c POC (<> result, manual entry)     HbA1c, POC (prediabetic range)     HbA1c, POC (controlled diabetic range)        The ASCVD Risk score (Arnett DK, et al., 2019) failed to calculate for the following reasons:   The 2019 ASCVD risk score is only valid for ages 47 to 107    Assessment & Plan:  Essential hypertension Assessment & Plan: Chronic, BP back to controlled status. Last visit we had put her back on a small dose of ARB and this appears to be  controlling her blood pressure. Continue all medications as prescribed.  Current hypertension medications:       Sig   amLODipine  (NORVASC ) 10 MG tablet (Taking) Take 1 tablet (10 mg total) by mouth daily.   furosemide  (LASIX ) 20 MG tablet (Taking As Needed) TAKE 1 TABLET BY MOUTH ONCE DAILY AS NEEDED   hydrochlorothiazide  (HYDRODIURIL ) 25 MG tablet (Taking) Take 1 tablet (25 mg total) by mouth daily.   telmisartan  (MICARDIS ) 20 MG tablet (Taking) Take 0.5 tablets (10 mg total) by mouth daily.   metoprolol  succinate (TOPROL -XL) 50 MG 24 hr  tablet Take 1 tablet (50 mg total) by mouth daily. Take with or immediately following a meal.        Orders: -     Metoprolol  Succinate ER; Take 1 tablet (50 mg total) by mouth daily. Take with or immediately following a meal.  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 pt is checking her blood sugars twice a day, she is due for microalbumin check, she is UTD on eye and foot exams. On the following medications:  Actos  45 mg daily Metofrmin XR 500 mg BID Glipizide  5 mg daily  Continue all as prescribed. Also taking statin for primary prevention of CAD.   Orders: -     POCT glycosylated hemoglobin (Hb A1C) -     glipiZIDE ; Take 1 tablet (5 mg total) by mouth daily before breakfast.  Dispense: 90 tablet; Refill: 1 -     Pioglitazone  HCl; TAKE 1 TABLET BY MOUTH ONCE DAILY  Dispense: 90 tablet; Refill: 1 -     Collection capillary blood specimen  Iron  deficiency -     Polysaccharide Iron  Complex; Take 1 capsule (150 mg total) by mouth daily.  Dispense: 90 capsule; Refill: 1     Return in about 6 months (around 04/22/2024) for HTN, DM.    Aida House, MD

## 2023-10-24 DIAGNOSIS — Z556 Problems related to health literacy: Secondary | ICD-10-CM | POA: Diagnosis not present

## 2023-10-24 DIAGNOSIS — Z853 Personal history of malignant neoplasm of breast: Secondary | ICD-10-CM | POA: Diagnosis not present

## 2023-10-24 DIAGNOSIS — E1122 Type 2 diabetes mellitus with diabetic chronic kidney disease: Secondary | ICD-10-CM | POA: Diagnosis not present

## 2023-10-24 DIAGNOSIS — Z6841 Body Mass Index (BMI) 40.0 and over, adult: Secondary | ICD-10-CM | POA: Diagnosis not present

## 2023-10-24 DIAGNOSIS — N611 Abscess of the breast and nipple: Secondary | ICD-10-CM | POA: Diagnosis not present

## 2023-10-24 DIAGNOSIS — Z7984 Long term (current) use of oral hypoglycemic drugs: Secondary | ICD-10-CM | POA: Diagnosis not present

## 2023-10-24 DIAGNOSIS — N1832 Chronic kidney disease, stage 3b: Secondary | ICD-10-CM | POA: Diagnosis not present

## 2023-10-24 DIAGNOSIS — I129 Hypertensive chronic kidney disease with stage 1 through stage 4 chronic kidney disease, or unspecified chronic kidney disease: Secondary | ICD-10-CM | POA: Diagnosis not present

## 2023-10-24 DIAGNOSIS — G959 Disease of spinal cord, unspecified: Secondary | ICD-10-CM | POA: Diagnosis not present

## 2023-10-24 DIAGNOSIS — M4802 Spinal stenosis, cervical region: Secondary | ICD-10-CM | POA: Diagnosis not present

## 2023-10-24 DIAGNOSIS — M48061 Spinal stenosis, lumbar region without neurogenic claudication: Secondary | ICD-10-CM | POA: Diagnosis not present

## 2023-10-24 DIAGNOSIS — Z9181 History of falling: Secondary | ICD-10-CM | POA: Diagnosis not present

## 2023-10-24 DIAGNOSIS — E785 Hyperlipidemia, unspecified: Secondary | ICD-10-CM | POA: Diagnosis not present

## 2023-10-24 DIAGNOSIS — F411 Generalized anxiety disorder: Secondary | ICD-10-CM | POA: Diagnosis not present

## 2023-10-24 DIAGNOSIS — J309 Allergic rhinitis, unspecified: Secondary | ICD-10-CM | POA: Diagnosis not present

## 2023-10-24 DIAGNOSIS — K219 Gastro-esophageal reflux disease without esophagitis: Secondary | ICD-10-CM | POA: Diagnosis not present

## 2023-10-24 DIAGNOSIS — Z7951 Long term (current) use of inhaled steroids: Secondary | ICD-10-CM | POA: Diagnosis not present

## 2023-10-24 DIAGNOSIS — M1990 Unspecified osteoarthritis, unspecified site: Secondary | ICD-10-CM | POA: Diagnosis not present

## 2023-10-24 DIAGNOSIS — D509 Iron deficiency anemia, unspecified: Secondary | ICD-10-CM | POA: Diagnosis not present

## 2023-10-25 NOTE — Assessment & Plan Note (Signed)
 A1C is at goal, 7.1. pt is checking her blood sugars twice a day, she is due for microalbumin check, she is UTD on eye and foot exams. On the following medications:  Actos  45 mg daily Metofrmin XR 500 mg BID Glipizide  5 mg daily  Continue all as prescribed. Also taking statin for primary prevention of CAD.

## 2023-10-25 NOTE — Assessment & Plan Note (Signed)
 Chronic, BP back to controlled status. Last visit we had put her back on a small dose of ARB and this appears to be controlling her blood pressure. Continue all medications as prescribed.  Current hypertension medications:       Sig   amLODipine  (NORVASC ) 10 MG tablet (Taking) Take 1 tablet (10 mg total) by mouth daily.   furosemide  (LASIX ) 20 MG tablet (Taking As Needed) TAKE 1 TABLET BY MOUTH ONCE DAILY AS NEEDED   hydrochlorothiazide  (HYDRODIURIL ) 25 MG tablet (Taking) Take 1 tablet (25 mg total) by mouth daily.   telmisartan  (MICARDIS ) 20 MG tablet (Taking) Take 0.5 tablets (10 mg total) by mouth daily.   metoprolol  succinate (TOPROL -XL) 50 MG 24 hr tablet Take 1 tablet (50 mg total) by mouth daily. Take with or immediately following a meal.

## 2023-10-26 ENCOUNTER — Telehealth: Payer: Self-pay

## 2023-10-26 NOTE — Telephone Encounter (Signed)
 Fax was received, completed and faxed.

## 2023-10-26 NOTE — Telephone Encounter (Signed)
 Ok to send verbal orders

## 2023-10-26 NOTE — Telephone Encounter (Signed)
 Have we received anything yet? I don't recall if we have or not.

## 2023-10-26 NOTE — Telephone Encounter (Signed)
 Copied from CRM [id number]. Topic: Clinical - Home Health Verbal Orders >> Oct 26, 2023  8:38 AM Freya Jesus wrote: Caller/Agency: [facility] Callback Number: [phone] voicemail) Service Requested: Occupational Therapy Frequency: 1x week for 6 weeks Any new concerns about the patient? No

## 2023-10-26 NOTE — Telephone Encounter (Signed)
 Left a detailed message with the approval for orders as below on Stephanie's voicemail.

## 2023-10-27 DIAGNOSIS — D509 Iron deficiency anemia, unspecified: Secondary | ICD-10-CM | POA: Diagnosis not present

## 2023-10-27 DIAGNOSIS — E1122 Type 2 diabetes mellitus with diabetic chronic kidney disease: Secondary | ICD-10-CM | POA: Diagnosis not present

## 2023-10-27 DIAGNOSIS — Z6841 Body Mass Index (BMI) 40.0 and over, adult: Secondary | ICD-10-CM | POA: Diagnosis not present

## 2023-10-27 DIAGNOSIS — J3089 Other allergic rhinitis: Secondary | ICD-10-CM | POA: Diagnosis not present

## 2023-10-27 DIAGNOSIS — J309 Allergic rhinitis, unspecified: Secondary | ICD-10-CM | POA: Diagnosis not present

## 2023-10-27 DIAGNOSIS — F411 Generalized anxiety disorder: Secondary | ICD-10-CM | POA: Diagnosis not present

## 2023-10-27 DIAGNOSIS — Z853 Personal history of malignant neoplasm of breast: Secondary | ICD-10-CM | POA: Diagnosis not present

## 2023-10-27 DIAGNOSIS — G959 Disease of spinal cord, unspecified: Secondary | ICD-10-CM | POA: Diagnosis not present

## 2023-10-27 DIAGNOSIS — N611 Abscess of the breast and nipple: Secondary | ICD-10-CM | POA: Diagnosis not present

## 2023-10-27 DIAGNOSIS — J3081 Allergic rhinitis due to animal (cat) (dog) hair and dander: Secondary | ICD-10-CM | POA: Diagnosis not present

## 2023-10-27 DIAGNOSIS — E785 Hyperlipidemia, unspecified: Secondary | ICD-10-CM | POA: Diagnosis not present

## 2023-10-27 DIAGNOSIS — M4802 Spinal stenosis, cervical region: Secondary | ICD-10-CM | POA: Diagnosis not present

## 2023-10-27 DIAGNOSIS — Z556 Problems related to health literacy: Secondary | ICD-10-CM | POA: Diagnosis not present

## 2023-10-27 DIAGNOSIS — M48061 Spinal stenosis, lumbar region without neurogenic claudication: Secondary | ICD-10-CM | POA: Diagnosis not present

## 2023-10-27 DIAGNOSIS — J301 Allergic rhinitis due to pollen: Secondary | ICD-10-CM | POA: Diagnosis not present

## 2023-10-27 DIAGNOSIS — M1990 Unspecified osteoarthritis, unspecified site: Secondary | ICD-10-CM | POA: Diagnosis not present

## 2023-10-27 DIAGNOSIS — K219 Gastro-esophageal reflux disease without esophagitis: Secondary | ICD-10-CM | POA: Diagnosis not present

## 2023-10-27 DIAGNOSIS — N1832 Chronic kidney disease, stage 3b: Secondary | ICD-10-CM | POA: Diagnosis not present

## 2023-10-27 DIAGNOSIS — Z7984 Long term (current) use of oral hypoglycemic drugs: Secondary | ICD-10-CM | POA: Diagnosis not present

## 2023-10-27 DIAGNOSIS — Z9181 History of falling: Secondary | ICD-10-CM | POA: Diagnosis not present

## 2023-10-27 DIAGNOSIS — Z7951 Long term (current) use of inhaled steroids: Secondary | ICD-10-CM | POA: Diagnosis not present

## 2023-10-27 DIAGNOSIS — I129 Hypertensive chronic kidney disease with stage 1 through stage 4 chronic kidney disease, or unspecified chronic kidney disease: Secondary | ICD-10-CM | POA: Diagnosis not present

## 2023-10-28 DIAGNOSIS — Z7951 Long term (current) use of inhaled steroids: Secondary | ICD-10-CM | POA: Diagnosis not present

## 2023-10-28 DIAGNOSIS — Z7984 Long term (current) use of oral hypoglycemic drugs: Secondary | ICD-10-CM | POA: Diagnosis not present

## 2023-10-28 DIAGNOSIS — J309 Allergic rhinitis, unspecified: Secondary | ICD-10-CM | POA: Diagnosis not present

## 2023-10-28 DIAGNOSIS — N611 Abscess of the breast and nipple: Secondary | ICD-10-CM | POA: Diagnosis not present

## 2023-10-28 DIAGNOSIS — Z6841 Body Mass Index (BMI) 40.0 and over, adult: Secondary | ICD-10-CM | POA: Diagnosis not present

## 2023-10-28 DIAGNOSIS — I129 Hypertensive chronic kidney disease with stage 1 through stage 4 chronic kidney disease, or unspecified chronic kidney disease: Secondary | ICD-10-CM | POA: Diagnosis not present

## 2023-10-28 DIAGNOSIS — E785 Hyperlipidemia, unspecified: Secondary | ICD-10-CM | POA: Diagnosis not present

## 2023-10-28 DIAGNOSIS — Z853 Personal history of malignant neoplasm of breast: Secondary | ICD-10-CM | POA: Diagnosis not present

## 2023-10-28 DIAGNOSIS — M1990 Unspecified osteoarthritis, unspecified site: Secondary | ICD-10-CM | POA: Diagnosis not present

## 2023-10-28 DIAGNOSIS — M48061 Spinal stenosis, lumbar region without neurogenic claudication: Secondary | ICD-10-CM | POA: Diagnosis not present

## 2023-10-28 DIAGNOSIS — M4802 Spinal stenosis, cervical region: Secondary | ICD-10-CM | POA: Diagnosis not present

## 2023-10-28 DIAGNOSIS — Z556 Problems related to health literacy: Secondary | ICD-10-CM | POA: Diagnosis not present

## 2023-10-28 DIAGNOSIS — F411 Generalized anxiety disorder: Secondary | ICD-10-CM | POA: Diagnosis not present

## 2023-10-28 DIAGNOSIS — E1122 Type 2 diabetes mellitus with diabetic chronic kidney disease: Secondary | ICD-10-CM | POA: Diagnosis not present

## 2023-10-28 DIAGNOSIS — K219 Gastro-esophageal reflux disease without esophagitis: Secondary | ICD-10-CM | POA: Diagnosis not present

## 2023-10-28 DIAGNOSIS — Z9181 History of falling: Secondary | ICD-10-CM | POA: Diagnosis not present

## 2023-10-28 DIAGNOSIS — D509 Iron deficiency anemia, unspecified: Secondary | ICD-10-CM | POA: Diagnosis not present

## 2023-10-28 DIAGNOSIS — N1832 Chronic kidney disease, stage 3b: Secondary | ICD-10-CM | POA: Diagnosis not present

## 2023-10-28 DIAGNOSIS — G959 Disease of spinal cord, unspecified: Secondary | ICD-10-CM | POA: Diagnosis not present

## 2023-10-29 DIAGNOSIS — Z853 Personal history of malignant neoplasm of breast: Secondary | ICD-10-CM | POA: Diagnosis not present

## 2023-10-29 DIAGNOSIS — Z9181 History of falling: Secondary | ICD-10-CM | POA: Diagnosis not present

## 2023-10-29 DIAGNOSIS — M4802 Spinal stenosis, cervical region: Secondary | ICD-10-CM | POA: Diagnosis not present

## 2023-10-29 DIAGNOSIS — E1122 Type 2 diabetes mellitus with diabetic chronic kidney disease: Secondary | ICD-10-CM | POA: Diagnosis not present

## 2023-10-29 DIAGNOSIS — K219 Gastro-esophageal reflux disease without esophagitis: Secondary | ICD-10-CM | POA: Diagnosis not present

## 2023-10-29 DIAGNOSIS — Z7984 Long term (current) use of oral hypoglycemic drugs: Secondary | ICD-10-CM | POA: Diagnosis not present

## 2023-10-29 DIAGNOSIS — Z7951 Long term (current) use of inhaled steroids: Secondary | ICD-10-CM | POA: Diagnosis not present

## 2023-10-29 DIAGNOSIS — F411 Generalized anxiety disorder: Secondary | ICD-10-CM | POA: Diagnosis not present

## 2023-10-29 DIAGNOSIS — D509 Iron deficiency anemia, unspecified: Secondary | ICD-10-CM | POA: Diagnosis not present

## 2023-10-29 DIAGNOSIS — J309 Allergic rhinitis, unspecified: Secondary | ICD-10-CM | POA: Diagnosis not present

## 2023-10-29 DIAGNOSIS — Z556 Problems related to health literacy: Secondary | ICD-10-CM | POA: Diagnosis not present

## 2023-10-29 DIAGNOSIS — E785 Hyperlipidemia, unspecified: Secondary | ICD-10-CM | POA: Diagnosis not present

## 2023-10-29 DIAGNOSIS — Z6841 Body Mass Index (BMI) 40.0 and over, adult: Secondary | ICD-10-CM | POA: Diagnosis not present

## 2023-10-29 DIAGNOSIS — M48061 Spinal stenosis, lumbar region without neurogenic claudication: Secondary | ICD-10-CM | POA: Diagnosis not present

## 2023-10-29 DIAGNOSIS — N1832 Chronic kidney disease, stage 3b: Secondary | ICD-10-CM | POA: Diagnosis not present

## 2023-10-29 DIAGNOSIS — M1990 Unspecified osteoarthritis, unspecified site: Secondary | ICD-10-CM | POA: Diagnosis not present

## 2023-10-29 DIAGNOSIS — G959 Disease of spinal cord, unspecified: Secondary | ICD-10-CM | POA: Diagnosis not present

## 2023-10-29 DIAGNOSIS — N611 Abscess of the breast and nipple: Secondary | ICD-10-CM | POA: Diagnosis not present

## 2023-10-29 DIAGNOSIS — I129 Hypertensive chronic kidney disease with stage 1 through stage 4 chronic kidney disease, or unspecified chronic kidney disease: Secondary | ICD-10-CM | POA: Diagnosis not present

## 2023-11-02 ENCOUNTER — Other Ambulatory Visit: Payer: Self-pay | Admitting: Family Medicine

## 2023-11-02 DIAGNOSIS — R600 Localized edema: Secondary | ICD-10-CM

## 2023-11-03 ENCOUNTER — Ambulatory Visit: Admitting: Family Medicine

## 2023-11-03 ENCOUNTER — Telehealth: Payer: Self-pay | Admitting: Family Medicine

## 2023-11-03 NOTE — Telephone Encounter (Signed)
 Copied from CRM [id number]. Topic: General - Other >> Oct 26, 2023  2:14 PM Alysia Jumbo S wrote: Reason for CRM: Kaitlyn with [facility] calling to verify fax received on 10/21/2023 for home health certification and plan of care. >> Nov 03, 2023  9:45 AM Smiley Dung G wrote: Katelyn w/ [facility] home health checking on outstanding order for plan of care they never rcvd the one faxed on 4/28 - the order number on it should be [id number] Pls refax: [phone] ext [phone] is her contact number ( if you need her )  also checking to see if we rcvd a 2nd order on 4/28 order: [id number] and a 3rd 5/1 order: [id number] - pls contact her if you haven't rcvd number above

## 2023-11-04 ENCOUNTER — Ambulatory Visit: Payer: Self-pay

## 2023-11-04 ENCOUNTER — Ambulatory Visit (INDEPENDENT_AMBULATORY_CARE_PROVIDER_SITE_OTHER): Admitting: Family Medicine

## 2023-11-04 ENCOUNTER — Encounter: Payer: Self-pay | Admitting: Family Medicine

## 2023-11-04 VITALS — BP 138/82 | HR 104 | Temp 98.2°F | Ht 62.0 in | Wt 235.3 lb

## 2023-11-04 DIAGNOSIS — K219 Gastro-esophageal reflux disease without esophagitis: Secondary | ICD-10-CM | POA: Diagnosis not present

## 2023-11-04 DIAGNOSIS — J309 Allergic rhinitis, unspecified: Secondary | ICD-10-CM | POA: Diagnosis not present

## 2023-11-04 DIAGNOSIS — M1990 Unspecified osteoarthritis, unspecified site: Secondary | ICD-10-CM | POA: Diagnosis not present

## 2023-11-04 DIAGNOSIS — Z9181 History of falling: Secondary | ICD-10-CM | POA: Diagnosis not present

## 2023-11-04 DIAGNOSIS — I129 Hypertensive chronic kidney disease with stage 1 through stage 4 chronic kidney disease, or unspecified chronic kidney disease: Secondary | ICD-10-CM | POA: Diagnosis not present

## 2023-11-04 DIAGNOSIS — F419 Anxiety disorder, unspecified: Secondary | ICD-10-CM | POA: Diagnosis not present

## 2023-11-04 DIAGNOSIS — Z556 Problems related to health literacy: Secondary | ICD-10-CM | POA: Diagnosis not present

## 2023-11-04 DIAGNOSIS — Z7951 Long term (current) use of inhaled steroids: Secondary | ICD-10-CM | POA: Diagnosis not present

## 2023-11-04 DIAGNOSIS — M48062 Spinal stenosis, lumbar region with neurogenic claudication: Secondary | ICD-10-CM

## 2023-11-04 DIAGNOSIS — N611 Abscess of the breast and nipple: Secondary | ICD-10-CM | POA: Diagnosis not present

## 2023-11-04 DIAGNOSIS — E1122 Type 2 diabetes mellitus with diabetic chronic kidney disease: Secondary | ICD-10-CM | POA: Diagnosis not present

## 2023-11-04 DIAGNOSIS — Z6841 Body Mass Index (BMI) 40.0 and over, adult: Secondary | ICD-10-CM | POA: Diagnosis not present

## 2023-11-04 DIAGNOSIS — H8110 Benign paroxysmal vertigo, unspecified ear: Secondary | ICD-10-CM | POA: Diagnosis not present

## 2023-11-04 DIAGNOSIS — D509 Iron deficiency anemia, unspecified: Secondary | ICD-10-CM | POA: Diagnosis not present

## 2023-11-04 DIAGNOSIS — F411 Generalized anxiety disorder: Secondary | ICD-10-CM | POA: Diagnosis not present

## 2023-11-04 DIAGNOSIS — M48061 Spinal stenosis, lumbar region without neurogenic claudication: Secondary | ICD-10-CM | POA: Diagnosis not present

## 2023-11-04 DIAGNOSIS — Z7984 Long term (current) use of oral hypoglycemic drugs: Secondary | ICD-10-CM | POA: Diagnosis not present

## 2023-11-04 DIAGNOSIS — N1832 Chronic kidney disease, stage 3b: Secondary | ICD-10-CM | POA: Diagnosis not present

## 2023-11-04 DIAGNOSIS — M4802 Spinal stenosis, cervical region: Secondary | ICD-10-CM | POA: Diagnosis not present

## 2023-11-04 DIAGNOSIS — G959 Disease of spinal cord, unspecified: Secondary | ICD-10-CM | POA: Diagnosis not present

## 2023-11-04 DIAGNOSIS — Z853 Personal history of malignant neoplasm of breast: Secondary | ICD-10-CM | POA: Diagnosis not present

## 2023-11-04 DIAGNOSIS — E785 Hyperlipidemia, unspecified: Secondary | ICD-10-CM | POA: Diagnosis not present

## 2023-11-04 MED ORDER — ESCITALOPRAM OXALATE 5 MG PO TABS: 5.0000 mg | ORAL_TABLET | Freq: Every day | ORAL | 0 refills | Status: DC

## 2023-11-04 MED ORDER — MECLIZINE HCL 12.5 MG PO TABS: 12.5000 mg | ORAL_TABLET | Freq: Three times a day (TID) | ORAL | 2 refills | Status: DC | PRN

## 2023-11-04 NOTE — Telephone Encounter (Signed)
Appt today at 9:30 am

## 2023-11-04 NOTE — Progress Notes (Signed)
 Established Patient Office Visit  Subjective   Patient ID: Doris Jackson, female    DOB: 07-18-1940  Age: 83 y.o. MRN: [id number]  Chief Complaint  Patient presents with   Anxiety    Pt is here to discuss her anxiety. She reports that her head is still "going around and around" and it gets worse when she gets up and moves her head and eyes around.   She reports that her anxiety is still bad. States that she feels "antsy and nervous" all the time. States that she is staying home more, is "scared of falling and scared of other things". States she is always worried about things and other people, can't seem to stop worrying. States she has trouble falling asleep at night but once she settles down she is able to sleep well. States that she still needs to be able to focus on things like paying bills and doing her tasks.     Current Outpatient Medications  Medication Instructions   acetaminophen  (TYLENOL ) 500 mg, Every 6 hours PRN   amLODipine  (NORVASC ) 10 mg, Oral, Daily   benzonatate  (TESSALON ) 200 mg, 3 times daily PRN   Blood Glucose Monitoring Suppl (ONETOUCH VERIO REFLECT) w/Device KIT USE UP TO FOUR TIMES DAILY AS DIRECTED.   Blood Pressure Monitoring (BLOOD PRESSURE CUFF) MISC 1 each, Does not apply, Daily   cyanocobalamin  2,000 mcg, Daily   diclofenac  Sodium (VOLTAREN ) 2 g, Topical, 4 times daily   escitalopram  (LEXAPRO ) 5 mg, Oral, Daily   famotidine  (PEPCID ) 20 mg, Oral, Daily at bedtime   fexofenadine (ALLEGRA) 180 mg, Daily PRN   fluticasone  (FLONASE ) 50 MCG/ACT nasal spray 1 spray, Each Nare, Daily   furosemide  (LASIX ) 20 mg, Oral, Daily PRN   glipiZIDE  (GLUCOTROL ) 5 mg, Oral, Daily before breakfast   glucose blood (ONETOUCH VERIO) test strip USE 1 STRIP TO CHECK GLUCOSE 2-4 TIMES DAILY AS NEEDED    hydrochlorothiazide  (HYDRODIURIL ) 25 mg, Oral, Daily   HYDROcodone -acetaminophen  (NORCO) 5-325 MG tablet 1 tablet, Oral, Daily PRN   iron  polysaccharides (FERREX 150) 150 mg,  Oral, Daily   Lancets 30G MISC USE TWICE DAILY AS DIRECTED FOR GLUCOSE TESTING AND MONITORING   Lancets MISC Use as directed   meclizine  (ANTIVERT ) 12.5 mg, Oral, 3 times daily PRN   metFORMIN  (GLUCOPHAGE -XR) 500 mg, Oral, 2 times daily   metoprolol  succinate (TOPROL -XL) 50 mg, Oral, Daily, Take with or immediately following a meal.   ONE TOUCH LANCETS MISC Test twice daily.   OneTouch Delica Lancets 33G MISC Use as directed once a day   pantoprazole  (PROTONIX ) 20 mg, Oral, Daily   pioglitazone  (ACTOS ) 45 MG tablet TAKE 1 TABLET BY MOUTH ONCE DAILY   pravastatin  (PRAVACHOL ) 40 mg, Oral, Daily   prednisoLONE  acetate (PRED FORTE ) 1 % ophthalmic suspension 1 drop, See admin instructions   Probiotic Product (PROBIOTIC-10 PO) 1 capsule, Daily   senna-docusate (SENOKOT-S) 8.6-50 MG tablet 1 tablet, Oral, Daily at bedtime   telmisartan  (MICARDIS ) 10 mg, Oral, Daily   vitamin C 1,000 mg, Daily   Vitamin D  4,000 Units, Daily    Patient Active Problem List   Diagnosis Date Noted   Benign paroxysmal positional vertigo 11/08/2023   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/2022   Anxiety    Cervical myelopathy (HCC) 09/22/2019   S/P cervical spinal fusion 09/22/2019   GERD (gastroesophageal reflux disease) 09/14/2019   Spinal stenosis in cervical region 09/14/2019   Numbness and tingling 09/14/2019   Status post cataract  extraction and insertion of intraocular lens of left eye 09/04/2019   Spinal stenosis of lumbar region with neurogenic claudication 08/24/2019   Persistent cough 06/06/2019   History of Descemet membrane endothelial keratoplasty (DMEK) 01/04/2019   Status post cataract extraction and insertion of intraocular lens of right eye 01/04/2019   Senile nuclear sclerosis 01/02/2019   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   Anatomical narrow angle, 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   Obesity, morbid, BMI 40.0-49.9 (HCC) 04/19/2018   Cataract    DM (diabetes mellitus) type II controlled with  renal manifestation (HCC) 06/17/2010   Dyslipidemia 06/17/2010   Microcytic anemia 06/17/2010   Essential hypertension 06/17/2010   Allergic rhinitis 06/17/2010   Osteoarthritis 06/17/2010   LOW BACK PAIN 06/17/2010   BREAST CANCER, HX OF 06/17/2010      Review of Systems  All other systems reviewed and are negative.     Objective:     BP 138/82   Pulse (!) 104   Temp 98.2 F (36.8 C) (Oral)   Ht 5\' 2"  (1.575 m)   Wt 235 lb 4.8 oz (106.7 kg)   SpO2 96%   BMI 43.04 kg/m    Physical Exam Vitals reviewed.  Constitutional:      Appearance: Normal appearance. She is morbidly obese.  Cardiovascular:     Rate and Rhythm: Normal rate and regular rhythm.     Heart sounds: Normal heart sounds.  Pulmonary:     Effort: Pulmonary effort is normal.     Breath sounds: Normal breath sounds. No wheezing.  Neurological:     Mental Status: She is alert and oriented to person, place, and time. Mental status is at baseline.  Psychiatric:        Mood and Affect: Mood is anxious.        Speech: Speech is rapid and pressured.        Behavior: Behavior normal.        Cognition and Memory: Cognition is impaired.      No results found for any visits on 11/04/23.    The ASCVD Risk score (Arnett DK, et al., 2019) failed to calculate for the following reasons:   The 2019 ASCVD risk score is only valid for ages 86 to 62    Assessment & Plan:  Benign paroxysmal positional vertigo, unspecified laterality Assessment & Plan: Chronic, recurrent, I had ordered physical therapy for her previously. I gave her a handout on the Epley maneuvers and recommended that the therapist take her through the movements so she can do them at home. Will also give meclizine  to be used as needed. Since this has been going on for so long I will also send her to ENT for an evaluation  Orders: -     Meclizine  HCl; Take 1 tablet (12.5 mg total) by mouth 3 (three) times daily as needed for dizziness.  Dispense:  60 tablet; Refill: 2 -     Ambulatory referral to ENT  Anxiety Assessment & Plan: Pt has been exhibiting increased nervousness and agitation for the past few months, I recommended adding lexapro  starting at 5 mg daily and I will see her back in 2 months for a follow up to see how she is doing.   Orders: -     Escitalopram  Oxalate; Take 1 tablet (5 mg total) by mouth daily.  Dispense: 90 tablet; Refill: 0  Spinal stenosis of lumbar region with neurogenic claudication Assessment & Plan: Pt is reporting worsening of her back pain, even though she has  not increased the amount of norco she is taking for this problem. I counseled her on this and what the orthopedist could offer her as far as pain management. Will send in new referral.   Orders: -     Ambulatory referral to Orthopedic Surgery     Return in about 2 months (around 01/04/2024) for follow up on anxiety.    Aida House, MD

## 2023-11-04 NOTE — Telephone Encounter (Signed)
 Copied from CRM [id number]. Topic: Clinical - Red Word Triage >> Nov 04, 2023  8:30 AM Oddis Bench wrote: Red Word that prompted transfer to Nurse Triage: Patient is stating that she is having an anxiety attack.  Chief Complaint: Anxiety Symptoms: "Stuck in deep thought", lightheadedness, tremors Frequency: 2 months Pertinent Negatives: Patient denies SI Disposition: [] ED /[] Urgent Care (no appt availability in office) / [x] Appointment(In office/virtual)/ []  [facility]/ [] Home Care/ [] Refused Recommended Disposition /[] [facility]/ []  Follow-up with PCP Additional Notes: Patient called in to report an exacerbation of nerves/anxiety. Patient denied SI or thoughts of harming others at this time. Patient stated she has been experiencing episodes of anxiety and lightheadedness off and on for about 2 months. Patient described anxiety as getting "stuck in deep thoughts/nerves". Patient requested anxiety medication. Advised patient to be seen within 3 days, per protocol. Scheduled same day appointment with PCP.  Reason for Disposition  MODERATE anxiety (e.g., persistent or frequent anxiety symptoms; interferes with sleep, school, or work)  Answer Assessment - Initial Assessment Questions 1. CONCERN: "Did anything happen that prompted you to call today?"      Increasing episodes of anxiety and symptoms related to anxiety  2. ANXIETY SYMPTOMS: "Can you describe how you (your loved one; patient) have been feeling?" (e.g., tense, restless, panicky, anxious, keyed up, overwhelmed, sense of impending doom).      States she has episodes of "deep thought" that cause dizziness and trembling 3. ONSET: "How long have you been feeling this way?" (e.g., hours, days, weeks)     Off and on for 2 months  4. SEVERITY: "How would you rate the level of anxiety?" (e.g., 0 - 10; or mild, moderate, severe).     Rates anxiety about a 5 5. FUNCTIONAL IMPAIRMENT: "How have these feelings affected your  ability to do daily activities?" "Have you had more difficulty than usual doing your normal daily activities?" (e.g., getting better, same, worse; self-care, school, work, interactions)     States she has been able perform ADLs 6. HISTORY: "Have you felt this way before?" "Have you ever been diagnosed with an anxiety problem in the past?" (e.g., generalized anxiety disorder, panic attacks, PTSD). If Yes, ask: "How was this problem treated?" (e.g., medicines, counseling, etc.)     Denies history 7. RISK OF HARM - SUICIDAL IDEATION: "Do you ever have thoughts of hurting or killing yourself?" If Yes, ask:  "Do you have these feelings now?" "Do you have a plan on how you would do this?"     Denies  8. TREATMENT:  "What has been done so far to treat this anxiety?" (e.g., medicines, relaxation strategies). "What has helped?"     Denies medications, is requesting medications at this time 11. PATIENT SUPPORT: "Who is with you now?" "Who do you live with?" "Do you have family or friends who you can talk to?"        Lives with husband, states she does not have other family members that she can rely on for support  12. OTHER SYMPTOMS: "Do you have any other symptoms?" (e.g., feeling depressed, trouble concentrating, trouble sleeping, trouble breathing, palpitations or fast heartbeat, chest pain, sweating, nausea, or diarrhea)       Vertigo/dizziness, tinnitus, sometimes has difficulty falling asleep  Protocols used: Anxiety and Panic Attack-A-AH

## 2023-11-05 DIAGNOSIS — M48061 Spinal stenosis, lumbar region without neurogenic claudication: Secondary | ICD-10-CM | POA: Diagnosis not present

## 2023-11-05 DIAGNOSIS — Z853 Personal history of malignant neoplasm of breast: Secondary | ICD-10-CM | POA: Diagnosis not present

## 2023-11-05 DIAGNOSIS — Z6841 Body Mass Index (BMI) 40.0 and over, adult: Secondary | ICD-10-CM | POA: Diagnosis not present

## 2023-11-05 DIAGNOSIS — N611 Abscess of the breast and nipple: Secondary | ICD-10-CM | POA: Diagnosis not present

## 2023-11-05 DIAGNOSIS — Z9181 History of falling: Secondary | ICD-10-CM | POA: Diagnosis not present

## 2023-11-05 DIAGNOSIS — M1990 Unspecified osteoarthritis, unspecified site: Secondary | ICD-10-CM | POA: Diagnosis not present

## 2023-11-05 DIAGNOSIS — M4802 Spinal stenosis, cervical region: Secondary | ICD-10-CM | POA: Diagnosis not present

## 2023-11-05 DIAGNOSIS — G959 Disease of spinal cord, unspecified: Secondary | ICD-10-CM | POA: Diagnosis not present

## 2023-11-05 DIAGNOSIS — K219 Gastro-esophageal reflux disease without esophagitis: Secondary | ICD-10-CM | POA: Diagnosis not present

## 2023-11-05 DIAGNOSIS — J309 Allergic rhinitis, unspecified: Secondary | ICD-10-CM | POA: Diagnosis not present

## 2023-11-05 DIAGNOSIS — E785 Hyperlipidemia, unspecified: Secondary | ICD-10-CM | POA: Diagnosis not present

## 2023-11-05 DIAGNOSIS — N1832 Chronic kidney disease, stage 3b: Secondary | ICD-10-CM | POA: Diagnosis not present

## 2023-11-05 DIAGNOSIS — Z556 Problems related to health literacy: Secondary | ICD-10-CM | POA: Diagnosis not present

## 2023-11-05 DIAGNOSIS — F411 Generalized anxiety disorder: Secondary | ICD-10-CM | POA: Diagnosis not present

## 2023-11-05 DIAGNOSIS — Z7951 Long term (current) use of inhaled steroids: Secondary | ICD-10-CM | POA: Diagnosis not present

## 2023-11-05 DIAGNOSIS — I129 Hypertensive chronic kidney disease with stage 1 through stage 4 chronic kidney disease, or unspecified chronic kidney disease: Secondary | ICD-10-CM | POA: Diagnosis not present

## 2023-11-05 DIAGNOSIS — Z7984 Long term (current) use of oral hypoglycemic drugs: Secondary | ICD-10-CM | POA: Diagnosis not present

## 2023-11-05 DIAGNOSIS — D509 Iron deficiency anemia, unspecified: Secondary | ICD-10-CM | POA: Diagnosis not present

## 2023-11-05 DIAGNOSIS — E1122 Type 2 diabetes mellitus with diabetic chronic kidney disease: Secondary | ICD-10-CM | POA: Diagnosis not present

## 2023-11-06 ENCOUNTER — Telehealth: Payer: Self-pay

## 2023-11-06 DIAGNOSIS — G959 Disease of spinal cord, unspecified: Secondary | ICD-10-CM | POA: Diagnosis not present

## 2023-11-06 DIAGNOSIS — Z7984 Long term (current) use of oral hypoglycemic drugs: Secondary | ICD-10-CM | POA: Diagnosis not present

## 2023-11-06 DIAGNOSIS — J309 Allergic rhinitis, unspecified: Secondary | ICD-10-CM | POA: Diagnosis not present

## 2023-11-06 DIAGNOSIS — F411 Generalized anxiety disorder: Secondary | ICD-10-CM | POA: Diagnosis not present

## 2023-11-06 DIAGNOSIS — N1832 Chronic kidney disease, stage 3b: Secondary | ICD-10-CM | POA: Diagnosis not present

## 2023-11-06 DIAGNOSIS — N611 Abscess of the breast and nipple: Secondary | ICD-10-CM | POA: Diagnosis not present

## 2023-11-06 DIAGNOSIS — M1990 Unspecified osteoarthritis, unspecified site: Secondary | ICD-10-CM | POA: Diagnosis not present

## 2023-11-06 DIAGNOSIS — K219 Gastro-esophageal reflux disease without esophagitis: Secondary | ICD-10-CM | POA: Diagnosis not present

## 2023-11-06 DIAGNOSIS — D509 Iron deficiency anemia, unspecified: Secondary | ICD-10-CM | POA: Diagnosis not present

## 2023-11-06 DIAGNOSIS — E1122 Type 2 diabetes mellitus with diabetic chronic kidney disease: Secondary | ICD-10-CM | POA: Diagnosis not present

## 2023-11-06 DIAGNOSIS — M4802 Spinal stenosis, cervical region: Secondary | ICD-10-CM | POA: Diagnosis not present

## 2023-11-06 DIAGNOSIS — M48061 Spinal stenosis, lumbar region without neurogenic claudication: Secondary | ICD-10-CM | POA: Diagnosis not present

## 2023-11-06 DIAGNOSIS — I129 Hypertensive chronic kidney disease with stage 1 through stage 4 chronic kidney disease, or unspecified chronic kidney disease: Secondary | ICD-10-CM | POA: Diagnosis not present

## 2023-11-06 DIAGNOSIS — Z9181 History of falling: Secondary | ICD-10-CM | POA: Diagnosis not present

## 2023-11-06 DIAGNOSIS — Z853 Personal history of malignant neoplasm of breast: Secondary | ICD-10-CM | POA: Diagnosis not present

## 2023-11-06 DIAGNOSIS — Z7951 Long term (current) use of inhaled steroids: Secondary | ICD-10-CM | POA: Diagnosis not present

## 2023-11-06 DIAGNOSIS — Z6841 Body Mass Index (BMI) 40.0 and over, adult: Secondary | ICD-10-CM | POA: Diagnosis not present

## 2023-11-06 DIAGNOSIS — E785 Hyperlipidemia, unspecified: Secondary | ICD-10-CM | POA: Diagnosis not present

## 2023-11-06 DIAGNOSIS — Z556 Problems related to health literacy: Secondary | ICD-10-CM | POA: Diagnosis not present

## 2023-11-06 NOTE — Telephone Encounter (Signed)
 Spoke with the patient for more information.  Patient complains of recurrent dizziness, "heart fluttering" and anxiety.  Message sent to PCP.

## 2023-11-06 NOTE — Telephone Encounter (Signed)
 Copied from CRM [id number]. Topic: General - Other >> Nov 06, 2023  1:33 PM Dorisann Garre T wrote: Reason for CRM: patient is needing a call back regarding the medications that where called in for her anxiety  and vertigo she is still having mild symptoms she would like a call back from the nurse

## 2023-11-08 DIAGNOSIS — H8110 Benign paroxysmal vertigo, unspecified ear: Secondary | ICD-10-CM | POA: Insufficient documentation

## 2023-11-08 NOTE — Assessment & Plan Note (Signed)
 Chronic, recurrent, I had ordered physical therapy for her previously. I gave her a handout on the Epley maneuvers and recommended that the therapist take her through the movements so she can do them at home. Will also give meclizine  to be used as needed. Since this has been going on for so long I will also send her to ENT for an evaluation

## 2023-11-08 NOTE — Assessment & Plan Note (Signed)
 Pt has been exhibiting increased nervousness and agitation for the past few months, I recommended adding lexapro  starting at 5 mg daily and I will see her back in 2 months for a follow up to see how she is doing.

## 2023-11-08 NOTE — Assessment & Plan Note (Signed)
 Pt is reporting worsening of her back pain, even though she has not increased the amount of norco she is taking for this problem. I counseled her on this and what the orthopedist could offer her as far as pain management. Will send in new referral.

## 2023-11-09 ENCOUNTER — Telehealth: Payer: Self-pay | Admitting: *Deleted

## 2023-11-09 ENCOUNTER — Other Ambulatory Visit: Payer: Self-pay | Admitting: Family Medicine

## 2023-11-09 DIAGNOSIS — Z556 Problems related to health literacy: Secondary | ICD-10-CM | POA: Diagnosis not present

## 2023-11-09 DIAGNOSIS — Z9181 History of falling: Secondary | ICD-10-CM | POA: Diagnosis not present

## 2023-11-09 DIAGNOSIS — M4802 Spinal stenosis, cervical region: Secondary | ICD-10-CM | POA: Diagnosis not present

## 2023-11-09 DIAGNOSIS — F411 Generalized anxiety disorder: Secondary | ICD-10-CM | POA: Diagnosis not present

## 2023-11-09 DIAGNOSIS — Z7984 Long term (current) use of oral hypoglycemic drugs: Secondary | ICD-10-CM | POA: Diagnosis not present

## 2023-11-09 DIAGNOSIS — I10 Essential (primary) hypertension: Secondary | ICD-10-CM

## 2023-11-09 DIAGNOSIS — M1990 Unspecified osteoarthritis, unspecified site: Secondary | ICD-10-CM | POA: Diagnosis not present

## 2023-11-09 DIAGNOSIS — Z7951 Long term (current) use of inhaled steroids: Secondary | ICD-10-CM | POA: Diagnosis not present

## 2023-11-09 DIAGNOSIS — G959 Disease of spinal cord, unspecified: Secondary | ICD-10-CM | POA: Diagnosis not present

## 2023-11-09 DIAGNOSIS — I129 Hypertensive chronic kidney disease with stage 1 through stage 4 chronic kidney disease, or unspecified chronic kidney disease: Secondary | ICD-10-CM | POA: Diagnosis not present

## 2023-11-09 DIAGNOSIS — D509 Iron deficiency anemia, unspecified: Secondary | ICD-10-CM | POA: Diagnosis not present

## 2023-11-09 DIAGNOSIS — M48061 Spinal stenosis, lumbar region without neurogenic claudication: Secondary | ICD-10-CM | POA: Diagnosis not present

## 2023-11-09 DIAGNOSIS — N611 Abscess of the breast and nipple: Secondary | ICD-10-CM | POA: Diagnosis not present

## 2023-11-09 DIAGNOSIS — Z853 Personal history of malignant neoplasm of breast: Secondary | ICD-10-CM | POA: Diagnosis not present

## 2023-11-09 DIAGNOSIS — J309 Allergic rhinitis, unspecified: Secondary | ICD-10-CM | POA: Diagnosis not present

## 2023-11-09 DIAGNOSIS — E1122 Type 2 diabetes mellitus with diabetic chronic kidney disease: Secondary | ICD-10-CM | POA: Diagnosis not present

## 2023-11-09 DIAGNOSIS — Z6841 Body Mass Index (BMI) 40.0 and over, adult: Secondary | ICD-10-CM | POA: Diagnosis not present

## 2023-11-09 DIAGNOSIS — K219 Gastro-esophageal reflux disease without esophagitis: Secondary | ICD-10-CM | POA: Diagnosis not present

## 2023-11-09 DIAGNOSIS — N1832 Chronic kidney disease, stage 3b: Secondary | ICD-10-CM | POA: Diagnosis not present

## 2023-11-09 DIAGNOSIS — E785 Hyperlipidemia, unspecified: Secondary | ICD-10-CM | POA: Diagnosis not present

## 2023-11-09 NOTE — Telephone Encounter (Signed)
 Spoke with the patient and informed her the referral was placed just last week and it can take some time to get in for an appt with the ENT providers.  Patient was advised a number of times to call our office for an appt if needed prior to seeing the specialist.  Offered an appt here, patient declined and stated she will call back if needed.

## 2023-11-09 NOTE — Telephone Encounter (Signed)
 Copied from CRM [id number]. Topic: General - Other >> Nov 09, 2023  9:10 AM Marlan Silva wrote: Reason for CRM: Patient is requesting Dr. Sheria Dills nurse to call her back. It was in regards to her ent referral. I told patient it was sent but she is still requesting a call.

## 2023-11-10 DIAGNOSIS — J3089 Other allergic rhinitis: Secondary | ICD-10-CM | POA: Diagnosis not present

## 2023-11-10 DIAGNOSIS — J301 Allergic rhinitis due to pollen: Secondary | ICD-10-CM | POA: Diagnosis not present

## 2023-11-10 NOTE — Telephone Encounter (Signed)
 Please let patient know that it takes time for the medications to take effect. She should use the meclizine  and continue the Epley maneuvers at home that I gave her in the visit and have the physical therapist take her through the maneuvers so she knows how to do them. If after 7 days her anxiety is not better then she can increase the lexapro  to 10 mg daily.

## 2023-11-10 NOTE — Telephone Encounter (Signed)
 Patient informed of the message below.

## 2023-11-12 DIAGNOSIS — Z7951 Long term (current) use of inhaled steroids: Secondary | ICD-10-CM | POA: Diagnosis not present

## 2023-11-12 DIAGNOSIS — Z556 Problems related to health literacy: Secondary | ICD-10-CM | POA: Diagnosis not present

## 2023-11-12 DIAGNOSIS — I129 Hypertensive chronic kidney disease with stage 1 through stage 4 chronic kidney disease, or unspecified chronic kidney disease: Secondary | ICD-10-CM | POA: Diagnosis not present

## 2023-11-12 DIAGNOSIS — M4802 Spinal stenosis, cervical region: Secondary | ICD-10-CM | POA: Diagnosis not present

## 2023-11-12 DIAGNOSIS — Z6841 Body Mass Index (BMI) 40.0 and over, adult: Secondary | ICD-10-CM | POA: Diagnosis not present

## 2023-11-12 DIAGNOSIS — D509 Iron deficiency anemia, unspecified: Secondary | ICD-10-CM | POA: Diagnosis not present

## 2023-11-12 DIAGNOSIS — F411 Generalized anxiety disorder: Secondary | ICD-10-CM | POA: Diagnosis not present

## 2023-11-12 DIAGNOSIS — M1990 Unspecified osteoarthritis, unspecified site: Secondary | ICD-10-CM | POA: Diagnosis not present

## 2023-11-12 DIAGNOSIS — Z7984 Long term (current) use of oral hypoglycemic drugs: Secondary | ICD-10-CM | POA: Diagnosis not present

## 2023-11-12 DIAGNOSIS — E1122 Type 2 diabetes mellitus with diabetic chronic kidney disease: Secondary | ICD-10-CM | POA: Diagnosis not present

## 2023-11-12 DIAGNOSIS — N611 Abscess of the breast and nipple: Secondary | ICD-10-CM | POA: Diagnosis not present

## 2023-11-12 DIAGNOSIS — Z9181 History of falling: Secondary | ICD-10-CM | POA: Diagnosis not present

## 2023-11-12 DIAGNOSIS — J309 Allergic rhinitis, unspecified: Secondary | ICD-10-CM | POA: Diagnosis not present

## 2023-11-12 DIAGNOSIS — K219 Gastro-esophageal reflux disease without esophagitis: Secondary | ICD-10-CM | POA: Diagnosis not present

## 2023-11-12 DIAGNOSIS — M48061 Spinal stenosis, lumbar region without neurogenic claudication: Secondary | ICD-10-CM | POA: Diagnosis not present

## 2023-11-12 DIAGNOSIS — E785 Hyperlipidemia, unspecified: Secondary | ICD-10-CM | POA: Diagnosis not present

## 2023-11-12 DIAGNOSIS — G959 Disease of spinal cord, unspecified: Secondary | ICD-10-CM | POA: Diagnosis not present

## 2023-11-12 DIAGNOSIS — Z853 Personal history of malignant neoplasm of breast: Secondary | ICD-10-CM | POA: Diagnosis not present

## 2023-11-12 DIAGNOSIS — N1832 Chronic kidney disease, stage 3b: Secondary | ICD-10-CM | POA: Diagnosis not present

## 2023-11-17 DIAGNOSIS — Z7951 Long term (current) use of inhaled steroids: Secondary | ICD-10-CM | POA: Diagnosis not present

## 2023-11-17 DIAGNOSIS — M48061 Spinal stenosis, lumbar region without neurogenic claudication: Secondary | ICD-10-CM | POA: Diagnosis not present

## 2023-11-17 DIAGNOSIS — E1122 Type 2 diabetes mellitus with diabetic chronic kidney disease: Secondary | ICD-10-CM | POA: Diagnosis not present

## 2023-11-17 DIAGNOSIS — Z9181 History of falling: Secondary | ICD-10-CM | POA: Diagnosis not present

## 2023-11-17 DIAGNOSIS — N611 Abscess of the breast and nipple: Secondary | ICD-10-CM | POA: Diagnosis not present

## 2023-11-17 DIAGNOSIS — J309 Allergic rhinitis, unspecified: Secondary | ICD-10-CM | POA: Diagnosis not present

## 2023-11-17 DIAGNOSIS — Z556 Problems related to health literacy: Secondary | ICD-10-CM | POA: Diagnosis not present

## 2023-11-17 DIAGNOSIS — I129 Hypertensive chronic kidney disease with stage 1 through stage 4 chronic kidney disease, or unspecified chronic kidney disease: Secondary | ICD-10-CM | POA: Diagnosis not present

## 2023-11-17 DIAGNOSIS — G959 Disease of spinal cord, unspecified: Secondary | ICD-10-CM | POA: Diagnosis not present

## 2023-11-17 DIAGNOSIS — K219 Gastro-esophageal reflux disease without esophagitis: Secondary | ICD-10-CM | POA: Diagnosis not present

## 2023-11-17 DIAGNOSIS — Z853 Personal history of malignant neoplasm of breast: Secondary | ICD-10-CM | POA: Diagnosis not present

## 2023-11-17 DIAGNOSIS — N1832 Chronic kidney disease, stage 3b: Secondary | ICD-10-CM | POA: Diagnosis not present

## 2023-11-17 DIAGNOSIS — Z7984 Long term (current) use of oral hypoglycemic drugs: Secondary | ICD-10-CM | POA: Diagnosis not present

## 2023-11-17 DIAGNOSIS — E785 Hyperlipidemia, unspecified: Secondary | ICD-10-CM | POA: Diagnosis not present

## 2023-11-17 DIAGNOSIS — Z6841 Body Mass Index (BMI) 40.0 and over, adult: Secondary | ICD-10-CM | POA: Diagnosis not present

## 2023-11-17 DIAGNOSIS — M1990 Unspecified osteoarthritis, unspecified site: Secondary | ICD-10-CM | POA: Diagnosis not present

## 2023-11-17 DIAGNOSIS — M4802 Spinal stenosis, cervical region: Secondary | ICD-10-CM | POA: Diagnosis not present

## 2023-11-17 DIAGNOSIS — D509 Iron deficiency anemia, unspecified: Secondary | ICD-10-CM | POA: Diagnosis not present

## 2023-11-17 DIAGNOSIS — F411 Generalized anxiety disorder: Secondary | ICD-10-CM | POA: Diagnosis not present

## 2023-11-18 ENCOUNTER — Encounter: Payer: Self-pay | Admitting: Family Medicine

## 2023-11-18 DIAGNOSIS — H8110 Benign paroxysmal vertigo, unspecified ear: Secondary | ICD-10-CM | POA: Diagnosis not present

## 2023-11-18 DIAGNOSIS — G43809 Other migraine, not intractable, without status migrainosus: Secondary | ICD-10-CM | POA: Diagnosis not present

## 2023-11-19 ENCOUNTER — Ambulatory Visit: Admitting: Physician Assistant

## 2023-11-19 DIAGNOSIS — K219 Gastro-esophageal reflux disease without esophagitis: Secondary | ICD-10-CM | POA: Diagnosis not present

## 2023-11-19 DIAGNOSIS — I129 Hypertensive chronic kidney disease with stage 1 through stage 4 chronic kidney disease, or unspecified chronic kidney disease: Secondary | ICD-10-CM | POA: Diagnosis not present

## 2023-11-19 DIAGNOSIS — F411 Generalized anxiety disorder: Secondary | ICD-10-CM | POA: Diagnosis not present

## 2023-11-19 DIAGNOSIS — E1122 Type 2 diabetes mellitus with diabetic chronic kidney disease: Secondary | ICD-10-CM | POA: Diagnosis not present

## 2023-11-19 DIAGNOSIS — Z853 Personal history of malignant neoplasm of breast: Secondary | ICD-10-CM | POA: Diagnosis not present

## 2023-11-19 DIAGNOSIS — N611 Abscess of the breast and nipple: Secondary | ICD-10-CM | POA: Diagnosis not present

## 2023-11-19 DIAGNOSIS — Z556 Problems related to health literacy: Secondary | ICD-10-CM | POA: Diagnosis not present

## 2023-11-19 DIAGNOSIS — G959 Disease of spinal cord, unspecified: Secondary | ICD-10-CM | POA: Diagnosis not present

## 2023-11-19 DIAGNOSIS — D509 Iron deficiency anemia, unspecified: Secondary | ICD-10-CM | POA: Diagnosis not present

## 2023-11-19 DIAGNOSIS — Z6841 Body Mass Index (BMI) 40.0 and over, adult: Secondary | ICD-10-CM | POA: Diagnosis not present

## 2023-11-19 DIAGNOSIS — J309 Allergic rhinitis, unspecified: Secondary | ICD-10-CM | POA: Diagnosis not present

## 2023-11-19 DIAGNOSIS — M1990 Unspecified osteoarthritis, unspecified site: Secondary | ICD-10-CM | POA: Diagnosis not present

## 2023-11-19 DIAGNOSIS — E785 Hyperlipidemia, unspecified: Secondary | ICD-10-CM | POA: Diagnosis not present

## 2023-11-19 DIAGNOSIS — M4802 Spinal stenosis, cervical region: Secondary | ICD-10-CM | POA: Diagnosis not present

## 2023-11-19 DIAGNOSIS — N1832 Chronic kidney disease, stage 3b: Secondary | ICD-10-CM | POA: Diagnosis not present

## 2023-11-19 DIAGNOSIS — Z9181 History of falling: Secondary | ICD-10-CM | POA: Diagnosis not present

## 2023-11-19 DIAGNOSIS — Z7984 Long term (current) use of oral hypoglycemic drugs: Secondary | ICD-10-CM | POA: Diagnosis not present

## 2023-11-19 DIAGNOSIS — Z7951 Long term (current) use of inhaled steroids: Secondary | ICD-10-CM | POA: Diagnosis not present

## 2023-11-19 DIAGNOSIS — M48061 Spinal stenosis, lumbar region without neurogenic claudication: Secondary | ICD-10-CM | POA: Diagnosis not present

## 2023-11-20 DIAGNOSIS — M48061 Spinal stenosis, lumbar region without neurogenic claudication: Secondary | ICD-10-CM | POA: Diagnosis not present

## 2023-11-20 DIAGNOSIS — E1122 Type 2 diabetes mellitus with diabetic chronic kidney disease: Secondary | ICD-10-CM | POA: Diagnosis not present

## 2023-11-20 DIAGNOSIS — Z9181 History of falling: Secondary | ICD-10-CM | POA: Diagnosis not present

## 2023-11-20 DIAGNOSIS — K219 Gastro-esophageal reflux disease without esophagitis: Secondary | ICD-10-CM | POA: Diagnosis not present

## 2023-11-20 DIAGNOSIS — M4802 Spinal stenosis, cervical region: Secondary | ICD-10-CM | POA: Diagnosis not present

## 2023-11-20 DIAGNOSIS — Z556 Problems related to health literacy: Secondary | ICD-10-CM | POA: Diagnosis not present

## 2023-11-20 DIAGNOSIS — N1832 Chronic kidney disease, stage 3b: Secondary | ICD-10-CM | POA: Diagnosis not present

## 2023-11-20 DIAGNOSIS — N611 Abscess of the breast and nipple: Secondary | ICD-10-CM | POA: Diagnosis not present

## 2023-11-20 DIAGNOSIS — F411 Generalized anxiety disorder: Secondary | ICD-10-CM | POA: Diagnosis not present

## 2023-11-20 DIAGNOSIS — Z7951 Long term (current) use of inhaled steroids: Secondary | ICD-10-CM | POA: Diagnosis not present

## 2023-11-20 DIAGNOSIS — I129 Hypertensive chronic kidney disease with stage 1 through stage 4 chronic kidney disease, or unspecified chronic kidney disease: Secondary | ICD-10-CM | POA: Diagnosis not present

## 2023-11-20 DIAGNOSIS — D509 Iron deficiency anemia, unspecified: Secondary | ICD-10-CM | POA: Diagnosis not present

## 2023-11-20 DIAGNOSIS — J309 Allergic rhinitis, unspecified: Secondary | ICD-10-CM | POA: Diagnosis not present

## 2023-11-20 DIAGNOSIS — Z853 Personal history of malignant neoplasm of breast: Secondary | ICD-10-CM | POA: Diagnosis not present

## 2023-11-20 DIAGNOSIS — M1990 Unspecified osteoarthritis, unspecified site: Secondary | ICD-10-CM | POA: Diagnosis not present

## 2023-11-20 DIAGNOSIS — Z6841 Body Mass Index (BMI) 40.0 and over, adult: Secondary | ICD-10-CM | POA: Diagnosis not present

## 2023-11-20 DIAGNOSIS — G959 Disease of spinal cord, unspecified: Secondary | ICD-10-CM | POA: Diagnosis not present

## 2023-11-20 DIAGNOSIS — E785 Hyperlipidemia, unspecified: Secondary | ICD-10-CM | POA: Diagnosis not present

## 2023-11-20 DIAGNOSIS — Z7984 Long term (current) use of oral hypoglycemic drugs: Secondary | ICD-10-CM | POA: Diagnosis not present

## 2023-11-24 DIAGNOSIS — I129 Hypertensive chronic kidney disease with stage 1 through stage 4 chronic kidney disease, or unspecified chronic kidney disease: Secondary | ICD-10-CM | POA: Diagnosis not present

## 2023-11-24 DIAGNOSIS — N611 Abscess of the breast and nipple: Secondary | ICD-10-CM | POA: Diagnosis not present

## 2023-11-24 DIAGNOSIS — Z853 Personal history of malignant neoplasm of breast: Secondary | ICD-10-CM | POA: Diagnosis not present

## 2023-11-24 DIAGNOSIS — M48061 Spinal stenosis, lumbar region without neurogenic claudication: Secondary | ICD-10-CM | POA: Diagnosis not present

## 2023-11-24 DIAGNOSIS — Z6841 Body Mass Index (BMI) 40.0 and over, adult: Secondary | ICD-10-CM | POA: Diagnosis not present

## 2023-11-24 DIAGNOSIS — D509 Iron deficiency anemia, unspecified: Secondary | ICD-10-CM | POA: Diagnosis not present

## 2023-11-24 DIAGNOSIS — Z556 Problems related to health literacy: Secondary | ICD-10-CM | POA: Diagnosis not present

## 2023-11-24 DIAGNOSIS — Z7951 Long term (current) use of inhaled steroids: Secondary | ICD-10-CM | POA: Diagnosis not present

## 2023-11-24 DIAGNOSIS — N1832 Chronic kidney disease, stage 3b: Secondary | ICD-10-CM | POA: Diagnosis not present

## 2023-11-24 DIAGNOSIS — F411 Generalized anxiety disorder: Secondary | ICD-10-CM | POA: Diagnosis not present

## 2023-11-24 DIAGNOSIS — Z9181 History of falling: Secondary | ICD-10-CM | POA: Diagnosis not present

## 2023-11-24 DIAGNOSIS — M1990 Unspecified osteoarthritis, unspecified site: Secondary | ICD-10-CM | POA: Diagnosis not present

## 2023-11-24 DIAGNOSIS — E785 Hyperlipidemia, unspecified: Secondary | ICD-10-CM | POA: Diagnosis not present

## 2023-11-24 DIAGNOSIS — J309 Allergic rhinitis, unspecified: Secondary | ICD-10-CM | POA: Diagnosis not present

## 2023-11-24 DIAGNOSIS — G959 Disease of spinal cord, unspecified: Secondary | ICD-10-CM | POA: Diagnosis not present

## 2023-11-24 DIAGNOSIS — K219 Gastro-esophageal reflux disease without esophagitis: Secondary | ICD-10-CM | POA: Diagnosis not present

## 2023-11-24 DIAGNOSIS — E1122 Type 2 diabetes mellitus with diabetic chronic kidney disease: Secondary | ICD-10-CM | POA: Diagnosis not present

## 2023-11-24 DIAGNOSIS — M4802 Spinal stenosis, cervical region: Secondary | ICD-10-CM | POA: Diagnosis not present

## 2023-11-24 DIAGNOSIS — Z7984 Long term (current) use of oral hypoglycemic drugs: Secondary | ICD-10-CM | POA: Diagnosis not present

## 2023-11-25 DIAGNOSIS — E1122 Type 2 diabetes mellitus with diabetic chronic kidney disease: Secondary | ICD-10-CM | POA: Diagnosis not present

## 2023-11-25 DIAGNOSIS — G959 Disease of spinal cord, unspecified: Secondary | ICD-10-CM | POA: Diagnosis not present

## 2023-11-25 DIAGNOSIS — N611 Abscess of the breast and nipple: Secondary | ICD-10-CM | POA: Diagnosis not present

## 2023-11-25 DIAGNOSIS — D509 Iron deficiency anemia, unspecified: Secondary | ICD-10-CM | POA: Diagnosis not present

## 2023-11-25 DIAGNOSIS — M48061 Spinal stenosis, lumbar region without neurogenic claudication: Secondary | ICD-10-CM | POA: Diagnosis not present

## 2023-11-25 DIAGNOSIS — J3081 Allergic rhinitis due to animal (cat) (dog) hair and dander: Secondary | ICD-10-CM | POA: Diagnosis not present

## 2023-11-25 DIAGNOSIS — M1990 Unspecified osteoarthritis, unspecified site: Secondary | ICD-10-CM | POA: Diagnosis not present

## 2023-11-25 DIAGNOSIS — I129 Hypertensive chronic kidney disease with stage 1 through stage 4 chronic kidney disease, or unspecified chronic kidney disease: Secondary | ICD-10-CM | POA: Diagnosis not present

## 2023-11-25 DIAGNOSIS — K219 Gastro-esophageal reflux disease without esophagitis: Secondary | ICD-10-CM | POA: Diagnosis not present

## 2023-11-25 DIAGNOSIS — N1832 Chronic kidney disease, stage 3b: Secondary | ICD-10-CM | POA: Diagnosis not present

## 2023-11-25 DIAGNOSIS — Z7951 Long term (current) use of inhaled steroids: Secondary | ICD-10-CM | POA: Diagnosis not present

## 2023-11-25 DIAGNOSIS — Z7984 Long term (current) use of oral hypoglycemic drugs: Secondary | ICD-10-CM | POA: Diagnosis not present

## 2023-11-25 DIAGNOSIS — Z853 Personal history of malignant neoplasm of breast: Secondary | ICD-10-CM | POA: Diagnosis not present

## 2023-11-25 DIAGNOSIS — E785 Hyperlipidemia, unspecified: Secondary | ICD-10-CM | POA: Diagnosis not present

## 2023-11-25 DIAGNOSIS — Z556 Problems related to health literacy: Secondary | ICD-10-CM | POA: Diagnosis not present

## 2023-11-25 DIAGNOSIS — J309 Allergic rhinitis, unspecified: Secondary | ICD-10-CM | POA: Diagnosis not present

## 2023-11-25 DIAGNOSIS — J3089 Other allergic rhinitis: Secondary | ICD-10-CM | POA: Diagnosis not present

## 2023-11-25 DIAGNOSIS — F411 Generalized anxiety disorder: Secondary | ICD-10-CM | POA: Diagnosis not present

## 2023-11-25 DIAGNOSIS — M4802 Spinal stenosis, cervical region: Secondary | ICD-10-CM | POA: Diagnosis not present

## 2023-11-25 DIAGNOSIS — Z9181 History of falling: Secondary | ICD-10-CM | POA: Diagnosis not present

## 2023-11-25 DIAGNOSIS — J301 Allergic rhinitis due to pollen: Secondary | ICD-10-CM | POA: Diagnosis not present

## 2023-11-25 DIAGNOSIS — Z6841 Body Mass Index (BMI) 40.0 and over, adult: Secondary | ICD-10-CM | POA: Diagnosis not present

## 2023-11-26 DIAGNOSIS — J309 Allergic rhinitis, unspecified: Secondary | ICD-10-CM | POA: Diagnosis not present

## 2023-11-26 DIAGNOSIS — M1990 Unspecified osteoarthritis, unspecified site: Secondary | ICD-10-CM | POA: Diagnosis not present

## 2023-11-26 DIAGNOSIS — I129 Hypertensive chronic kidney disease with stage 1 through stage 4 chronic kidney disease, or unspecified chronic kidney disease: Secondary | ICD-10-CM | POA: Diagnosis not present

## 2023-11-26 DIAGNOSIS — Z7951 Long term (current) use of inhaled steroids: Secondary | ICD-10-CM | POA: Diagnosis not present

## 2023-11-26 DIAGNOSIS — Z9181 History of falling: Secondary | ICD-10-CM | POA: Diagnosis not present

## 2023-11-26 DIAGNOSIS — N611 Abscess of the breast and nipple: Secondary | ICD-10-CM | POA: Diagnosis not present

## 2023-11-26 DIAGNOSIS — E1122 Type 2 diabetes mellitus with diabetic chronic kidney disease: Secondary | ICD-10-CM | POA: Diagnosis not present

## 2023-11-26 DIAGNOSIS — M48061 Spinal stenosis, lumbar region without neurogenic claudication: Secondary | ICD-10-CM | POA: Diagnosis not present

## 2023-11-26 DIAGNOSIS — K219 Gastro-esophageal reflux disease without esophagitis: Secondary | ICD-10-CM | POA: Diagnosis not present

## 2023-11-26 DIAGNOSIS — Z556 Problems related to health literacy: Secondary | ICD-10-CM | POA: Diagnosis not present

## 2023-11-26 DIAGNOSIS — Z853 Personal history of malignant neoplasm of breast: Secondary | ICD-10-CM | POA: Diagnosis not present

## 2023-11-26 DIAGNOSIS — E785 Hyperlipidemia, unspecified: Secondary | ICD-10-CM | POA: Diagnosis not present

## 2023-11-26 DIAGNOSIS — G959 Disease of spinal cord, unspecified: Secondary | ICD-10-CM | POA: Diagnosis not present

## 2023-11-26 DIAGNOSIS — N1832 Chronic kidney disease, stage 3b: Secondary | ICD-10-CM | POA: Diagnosis not present

## 2023-11-26 DIAGNOSIS — Z7984 Long term (current) use of oral hypoglycemic drugs: Secondary | ICD-10-CM | POA: Diagnosis not present

## 2023-11-26 DIAGNOSIS — Z6841 Body Mass Index (BMI) 40.0 and over, adult: Secondary | ICD-10-CM | POA: Diagnosis not present

## 2023-11-26 DIAGNOSIS — M4802 Spinal stenosis, cervical region: Secondary | ICD-10-CM | POA: Diagnosis not present

## 2023-11-26 DIAGNOSIS — D509 Iron deficiency anemia, unspecified: Secondary | ICD-10-CM | POA: Diagnosis not present

## 2023-11-26 DIAGNOSIS — F411 Generalized anxiety disorder: Secondary | ICD-10-CM | POA: Diagnosis not present

## 2023-11-30 ENCOUNTER — Telehealth: Payer: Self-pay | Admitting: *Deleted

## 2023-11-30 NOTE — Telephone Encounter (Signed)
 Copied from CRM [id number]. Topic: General - Other >> Nov 30, 2023  2:10 PM Shardie S wrote: Reason for CRM: Katelyn with [facility] health states that she is re faxing a physician order to update PT frequency for patient. She states document was originally faxed on 05/28.  Callback # [phone]

## 2023-12-01 DIAGNOSIS — J309 Allergic rhinitis, unspecified: Secondary | ICD-10-CM | POA: Diagnosis not present

## 2023-12-01 DIAGNOSIS — E785 Hyperlipidemia, unspecified: Secondary | ICD-10-CM | POA: Diagnosis not present

## 2023-12-01 DIAGNOSIS — M48061 Spinal stenosis, lumbar region without neurogenic claudication: Secondary | ICD-10-CM | POA: Diagnosis not present

## 2023-12-01 DIAGNOSIS — I129 Hypertensive chronic kidney disease with stage 1 through stage 4 chronic kidney disease, or unspecified chronic kidney disease: Secondary | ICD-10-CM | POA: Diagnosis not present

## 2023-12-01 DIAGNOSIS — M4802 Spinal stenosis, cervical region: Secondary | ICD-10-CM | POA: Diagnosis not present

## 2023-12-01 DIAGNOSIS — N611 Abscess of the breast and nipple: Secondary | ICD-10-CM | POA: Diagnosis not present

## 2023-12-01 DIAGNOSIS — N1832 Chronic kidney disease, stage 3b: Secondary | ICD-10-CM | POA: Diagnosis not present

## 2023-12-01 DIAGNOSIS — G959 Disease of spinal cord, unspecified: Secondary | ICD-10-CM | POA: Diagnosis not present

## 2023-12-01 DIAGNOSIS — Z853 Personal history of malignant neoplasm of breast: Secondary | ICD-10-CM | POA: Diagnosis not present

## 2023-12-01 DIAGNOSIS — Z7984 Long term (current) use of oral hypoglycemic drugs: Secondary | ICD-10-CM | POA: Diagnosis not present

## 2023-12-01 DIAGNOSIS — Z556 Problems related to health literacy: Secondary | ICD-10-CM | POA: Diagnosis not present

## 2023-12-01 DIAGNOSIS — K219 Gastro-esophageal reflux disease without esophagitis: Secondary | ICD-10-CM | POA: Diagnosis not present

## 2023-12-01 DIAGNOSIS — E1122 Type 2 diabetes mellitus with diabetic chronic kidney disease: Secondary | ICD-10-CM | POA: Diagnosis not present

## 2023-12-01 DIAGNOSIS — M1990 Unspecified osteoarthritis, unspecified site: Secondary | ICD-10-CM | POA: Diagnosis not present

## 2023-12-01 DIAGNOSIS — Z9181 History of falling: Secondary | ICD-10-CM | POA: Diagnosis not present

## 2023-12-01 DIAGNOSIS — Z6841 Body Mass Index (BMI) 40.0 and over, adult: Secondary | ICD-10-CM | POA: Diagnosis not present

## 2023-12-01 DIAGNOSIS — D509 Iron deficiency anemia, unspecified: Secondary | ICD-10-CM | POA: Diagnosis not present

## 2023-12-01 DIAGNOSIS — F411 Generalized anxiety disorder: Secondary | ICD-10-CM | POA: Diagnosis not present

## 2023-12-01 DIAGNOSIS — Z7951 Long term (current) use of inhaled steroids: Secondary | ICD-10-CM | POA: Diagnosis not present

## 2023-12-03 DIAGNOSIS — K219 Gastro-esophageal reflux disease without esophagitis: Secondary | ICD-10-CM | POA: Diagnosis not present

## 2023-12-03 DIAGNOSIS — I129 Hypertensive chronic kidney disease with stage 1 through stage 4 chronic kidney disease, or unspecified chronic kidney disease: Secondary | ICD-10-CM | POA: Diagnosis not present

## 2023-12-03 DIAGNOSIS — M4802 Spinal stenosis, cervical region: Secondary | ICD-10-CM | POA: Diagnosis not present

## 2023-12-03 DIAGNOSIS — M48061 Spinal stenosis, lumbar region without neurogenic claudication: Secondary | ICD-10-CM | POA: Diagnosis not present

## 2023-12-03 DIAGNOSIS — Z6841 Body Mass Index (BMI) 40.0 and over, adult: Secondary | ICD-10-CM | POA: Diagnosis not present

## 2023-12-03 DIAGNOSIS — G959 Disease of spinal cord, unspecified: Secondary | ICD-10-CM | POA: Diagnosis not present

## 2023-12-03 DIAGNOSIS — E1122 Type 2 diabetes mellitus with diabetic chronic kidney disease: Secondary | ICD-10-CM | POA: Diagnosis not present

## 2023-12-03 DIAGNOSIS — J309 Allergic rhinitis, unspecified: Secondary | ICD-10-CM | POA: Diagnosis not present

## 2023-12-03 DIAGNOSIS — E785 Hyperlipidemia, unspecified: Secondary | ICD-10-CM | POA: Diagnosis not present

## 2023-12-03 DIAGNOSIS — M1990 Unspecified osteoarthritis, unspecified site: Secondary | ICD-10-CM | POA: Diagnosis not present

## 2023-12-03 DIAGNOSIS — Z556 Problems related to health literacy: Secondary | ICD-10-CM | POA: Diagnosis not present

## 2023-12-03 DIAGNOSIS — D509 Iron deficiency anemia, unspecified: Secondary | ICD-10-CM | POA: Diagnosis not present

## 2023-12-03 DIAGNOSIS — N611 Abscess of the breast and nipple: Secondary | ICD-10-CM | POA: Diagnosis not present

## 2023-12-03 DIAGNOSIS — N1832 Chronic kidney disease, stage 3b: Secondary | ICD-10-CM | POA: Diagnosis not present

## 2023-12-03 DIAGNOSIS — Z853 Personal history of malignant neoplasm of breast: Secondary | ICD-10-CM | POA: Diagnosis not present

## 2023-12-03 DIAGNOSIS — Z7951 Long term (current) use of inhaled steroids: Secondary | ICD-10-CM | POA: Diagnosis not present

## 2023-12-03 DIAGNOSIS — Z9181 History of falling: Secondary | ICD-10-CM | POA: Diagnosis not present

## 2023-12-03 DIAGNOSIS — Z7984 Long term (current) use of oral hypoglycemic drugs: Secondary | ICD-10-CM | POA: Diagnosis not present

## 2023-12-03 DIAGNOSIS — F411 Generalized anxiety disorder: Secondary | ICD-10-CM | POA: Diagnosis not present

## 2023-12-07 DIAGNOSIS — M4802 Spinal stenosis, cervical region: Secondary | ICD-10-CM | POA: Diagnosis not present

## 2023-12-07 DIAGNOSIS — Z981 Arthrodesis status: Secondary | ICD-10-CM | POA: Diagnosis not present

## 2023-12-07 DIAGNOSIS — E785 Hyperlipidemia, unspecified: Secondary | ICD-10-CM | POA: Diagnosis not present

## 2023-12-07 DIAGNOSIS — G959 Disease of spinal cord, unspecified: Secondary | ICD-10-CM | POA: Diagnosis not present

## 2023-12-07 DIAGNOSIS — Z7951 Long term (current) use of inhaled steroids: Secondary | ICD-10-CM | POA: Diagnosis not present

## 2023-12-07 DIAGNOSIS — Z79891 Long term (current) use of opiate analgesic: Secondary | ICD-10-CM | POA: Diagnosis not present

## 2023-12-07 DIAGNOSIS — K219 Gastro-esophageal reflux disease without esophagitis: Secondary | ICD-10-CM | POA: Diagnosis not present

## 2023-12-07 DIAGNOSIS — N1832 Chronic kidney disease, stage 3b: Secondary | ICD-10-CM | POA: Diagnosis not present

## 2023-12-07 DIAGNOSIS — I129 Hypertensive chronic kidney disease with stage 1 through stage 4 chronic kidney disease, or unspecified chronic kidney disease: Secondary | ICD-10-CM | POA: Diagnosis not present

## 2023-12-07 DIAGNOSIS — Z7984 Long term (current) use of oral hypoglycemic drugs: Secondary | ICD-10-CM | POA: Diagnosis not present

## 2023-12-07 DIAGNOSIS — M48061 Spinal stenosis, lumbar region without neurogenic claudication: Secondary | ICD-10-CM | POA: Diagnosis not present

## 2023-12-07 DIAGNOSIS — M1990 Unspecified osteoarthritis, unspecified site: Secondary | ICD-10-CM | POA: Diagnosis not present

## 2023-12-07 DIAGNOSIS — Z6841 Body Mass Index (BMI) 40.0 and over, adult: Secondary | ICD-10-CM | POA: Diagnosis not present

## 2023-12-07 DIAGNOSIS — F411 Generalized anxiety disorder: Secondary | ICD-10-CM | POA: Diagnosis not present

## 2023-12-07 DIAGNOSIS — Z853 Personal history of malignant neoplasm of breast: Secondary | ICD-10-CM | POA: Diagnosis not present

## 2023-12-07 DIAGNOSIS — E1122 Type 2 diabetes mellitus with diabetic chronic kidney disease: Secondary | ICD-10-CM | POA: Diagnosis not present

## 2023-12-07 DIAGNOSIS — I2510 Atherosclerotic heart disease of native coronary artery without angina pectoris: Secondary | ICD-10-CM | POA: Diagnosis not present

## 2023-12-07 DIAGNOSIS — D509 Iron deficiency anemia, unspecified: Secondary | ICD-10-CM | POA: Diagnosis not present

## 2023-12-07 DIAGNOSIS — J309 Allergic rhinitis, unspecified: Secondary | ICD-10-CM | POA: Diagnosis not present

## 2023-12-07 DIAGNOSIS — Z9181 History of falling: Secondary | ICD-10-CM | POA: Diagnosis not present

## 2023-12-08 ENCOUNTER — Telehealth: Payer: Self-pay

## 2023-12-08 ENCOUNTER — Other Ambulatory Visit: Payer: Self-pay | Admitting: Family Medicine

## 2023-12-08 ENCOUNTER — Ambulatory Visit: Payer: Self-pay

## 2023-12-08 DIAGNOSIS — E785 Hyperlipidemia, unspecified: Secondary | ICD-10-CM | POA: Diagnosis not present

## 2023-12-08 DIAGNOSIS — Z853 Personal history of malignant neoplasm of breast: Secondary | ICD-10-CM | POA: Diagnosis not present

## 2023-12-08 DIAGNOSIS — Z7951 Long term (current) use of inhaled steroids: Secondary | ICD-10-CM | POA: Diagnosis not present

## 2023-12-08 DIAGNOSIS — M48061 Spinal stenosis, lumbar region without neurogenic claudication: Secondary | ICD-10-CM | POA: Diagnosis not present

## 2023-12-08 DIAGNOSIS — J301 Allergic rhinitis due to pollen: Secondary | ICD-10-CM | POA: Diagnosis not present

## 2023-12-08 DIAGNOSIS — M4802 Spinal stenosis, cervical region: Secondary | ICD-10-CM | POA: Diagnosis not present

## 2023-12-08 DIAGNOSIS — D509 Iron deficiency anemia, unspecified: Secondary | ICD-10-CM | POA: Diagnosis not present

## 2023-12-08 DIAGNOSIS — I2510 Atherosclerotic heart disease of native coronary artery without angina pectoris: Secondary | ICD-10-CM | POA: Diagnosis not present

## 2023-12-08 DIAGNOSIS — M1990 Unspecified osteoarthritis, unspecified site: Secondary | ICD-10-CM | POA: Diagnosis not present

## 2023-12-08 DIAGNOSIS — Z79891 Long term (current) use of opiate analgesic: Secondary | ICD-10-CM | POA: Diagnosis not present

## 2023-12-08 DIAGNOSIS — Z7984 Long term (current) use of oral hypoglycemic drugs: Secondary | ICD-10-CM | POA: Diagnosis not present

## 2023-12-08 DIAGNOSIS — Z6841 Body Mass Index (BMI) 40.0 and over, adult: Secondary | ICD-10-CM | POA: Diagnosis not present

## 2023-12-08 DIAGNOSIS — J309 Allergic rhinitis, unspecified: Secondary | ICD-10-CM | POA: Diagnosis not present

## 2023-12-08 DIAGNOSIS — N1832 Chronic kidney disease, stage 3b: Secondary | ICD-10-CM | POA: Diagnosis not present

## 2023-12-08 DIAGNOSIS — J3089 Other allergic rhinitis: Secondary | ICD-10-CM | POA: Diagnosis not present

## 2023-12-08 DIAGNOSIS — K219 Gastro-esophageal reflux disease without esophagitis: Secondary | ICD-10-CM | POA: Diagnosis not present

## 2023-12-08 DIAGNOSIS — G959 Disease of spinal cord, unspecified: Secondary | ICD-10-CM | POA: Diagnosis not present

## 2023-12-08 DIAGNOSIS — F411 Generalized anxiety disorder: Secondary | ICD-10-CM | POA: Diagnosis not present

## 2023-12-08 DIAGNOSIS — I129 Hypertensive chronic kidney disease with stage 1 through stage 4 chronic kidney disease, or unspecified chronic kidney disease: Secondary | ICD-10-CM | POA: Diagnosis not present

## 2023-12-08 DIAGNOSIS — J3081 Allergic rhinitis due to animal (cat) (dog) hair and dander: Secondary | ICD-10-CM | POA: Diagnosis not present

## 2023-12-08 DIAGNOSIS — Z9181 History of falling: Secondary | ICD-10-CM | POA: Diagnosis not present

## 2023-12-08 DIAGNOSIS — E1122 Type 2 diabetes mellitus with diabetic chronic kidney disease: Secondary | ICD-10-CM | POA: Diagnosis not present

## 2023-12-08 DIAGNOSIS — Z981 Arthrodesis status: Secondary | ICD-10-CM | POA: Diagnosis not present

## 2023-12-08 MED ORDER — HYDROCODONE-ACETAMINOPHEN 5-325 MG PO TABS: 1.0000 | ORAL_TABLET | Freq: Every day | ORAL | 0 refills | Status: DC | PRN

## 2023-12-08 NOTE — Telephone Encounter (Signed)
 FYI Only or Action Required?: Action required by provider  Patient was last seen in primary care on 11/04/2023 by Aida House, MD. Called Nurse Triage reporting Medication Refill--Order Clarification. Symptoms began -------. Interventions attempted: Other: --------. Symptoms are: --------.  Triage Disposition: Call PCP Now  Patient/caregiver understands and will follow disposition?: --------       Fernand Howard from [facility] called to verify if this prescription was for an acute issue or a chronic issue. [phone] is Yang's callback number at the Pharmacy.   Called the CAL for clarification on order---CAL advised staff busy with patients at this time.      Copied from CRM [id number]. Topic: Clinical - Prescription Issue >> Dec 08, 2023  3:07 PM Howard Macho wrote: Reason for IRS:WNIO from [facility] called stating the patient is at the pharmacy now and she need know if the hydrocodone  medication is for chronic or acute pain  CB [phone] Reason for Disposition . [1] Pharmacy calling with prescription question AND [2] triager unable to answer question  Answer Assessment - Initial Assessment Questions 1. NAME of MEDICINE: "What medicine(s) are you calling about?"      HYDROcodone -acetaminophen  (NORCO) 5-325 MG tablet [[id number]]   2. QUESTION: "What is your question?" (e.g., double dose of medicine, side effect)     Fernand Howard from [facility] wanted to know if this medication was for an acute issue or a chronic issue---this determines the amount of medication they can dispense to the patient 3. PRESCRIBER: "Who prescribed the medicine?" Reason: if prescribed by specialist, call should be referred to that group.     Dr Osborn Blaze 4. SYMPTOMS: "Do you have any symptoms?" If Yes, ask: "What symptoms are you having?"  "How bad are the symptoms (e.g., mild, moderate, severe)     ------  Patient is currently at the Pharmacy Fernand Howard at the Pharmacy called for clarification and Yang's  callback number is [phone]  Protocols used: Medication Question Call-A-AH

## 2023-12-08 NOTE — Telephone Encounter (Signed)
 Copied from CRM [id number]. Topic: Clinical - Medication Refill >> Dec 08, 2023 11:17 AM Deaijah H wrote: Medication: HYDROcodone -acetaminophen  (NORCO) 5-325 MG tablet  Has the patient contacted their pharmacy? Yes (Agent: If no, request that the patient contact the pharmacy for the refill. If patient does not wish to contact the pharmacy document the reason why and proceed with request.) (Agent: If yes, when and what did the pharmacy advise?)  This is the patient's preferred pharmacy:  [facility] [address] [place] - [address] [place] [address] Phone: [phone] Fax: [phone]  Is this the correct pharmacy for this prescription? Yes If no, delete pharmacy and type the correct one.   Has the prescription been filled recently? No  Is the patient out of the medication? No  Has the patient been seen for an appointment in the last year OR does the patient have an upcoming appointment? Yes  Can we respond through MyChart? No  Agent: Please be advised that Rx refills may take up to 3 business days. We ask that you follow-up with your pharmacy.

## 2023-12-08 NOTE — Telephone Encounter (Signed)
 Copied from CRM [id number]. Topic: General - Other >> Dec 08, 2023 10:28 AM Howard Macho wrote: Reason for CRM: katlyn from [facility] home health called wanting to see if we have the order that was sent on 6/8 Order# [id number] CB [phone]

## 2023-12-08 NOTE — Telephone Encounter (Signed)
 Message sent to PCP as pharmacist questioned if this was for a chronic or an acute issue?

## 2023-12-08 NOTE — Telephone Encounter (Signed)
 Script sent earlier today

## 2023-12-08 NOTE — Telephone Encounter (Signed)
 It is for chronic pain-- severe spinal canal stenosis

## 2023-12-08 NOTE — Telephone Encounter (Signed)
 Spoke with Fernand Howard, pharmacist and advised her per PCP the patient takes Hydrocodone  for chronic back issues.

## 2023-12-09 NOTE — Telephone Encounter (Signed)
Faxed and sent to be scanned

## 2023-12-15 DIAGNOSIS — M5416 Radiculopathy, lumbar region: Secondary | ICD-10-CM | POA: Diagnosis not present

## 2023-12-15 DIAGNOSIS — R42 Dizziness and giddiness: Secondary | ICD-10-CM | POA: Diagnosis not present

## 2023-12-15 DIAGNOSIS — M48061 Spinal stenosis, lumbar region without neurogenic claudication: Secondary | ICD-10-CM | POA: Diagnosis not present

## 2023-12-15 DIAGNOSIS — R29898 Other symptoms and signs involving the musculoskeletal system: Secondary | ICD-10-CM | POA: Diagnosis not present

## 2023-12-16 DIAGNOSIS — Z981 Arthrodesis status: Secondary | ICD-10-CM | POA: Diagnosis not present

## 2023-12-16 DIAGNOSIS — M1990 Unspecified osteoarthritis, unspecified site: Secondary | ICD-10-CM | POA: Diagnosis not present

## 2023-12-16 DIAGNOSIS — N1832 Chronic kidney disease, stage 3b: Secondary | ICD-10-CM | POA: Diagnosis not present

## 2023-12-16 DIAGNOSIS — K219 Gastro-esophageal reflux disease without esophagitis: Secondary | ICD-10-CM | POA: Diagnosis not present

## 2023-12-16 DIAGNOSIS — E785 Hyperlipidemia, unspecified: Secondary | ICD-10-CM | POA: Diagnosis not present

## 2023-12-16 DIAGNOSIS — M48061 Spinal stenosis, lumbar region without neurogenic claudication: Secondary | ICD-10-CM | POA: Diagnosis not present

## 2023-12-16 DIAGNOSIS — I129 Hypertensive chronic kidney disease with stage 1 through stage 4 chronic kidney disease, or unspecified chronic kidney disease: Secondary | ICD-10-CM | POA: Diagnosis not present

## 2023-12-16 DIAGNOSIS — J309 Allergic rhinitis, unspecified: Secondary | ICD-10-CM | POA: Diagnosis not present

## 2023-12-16 DIAGNOSIS — Z6841 Body Mass Index (BMI) 40.0 and over, adult: Secondary | ICD-10-CM | POA: Diagnosis not present

## 2023-12-16 DIAGNOSIS — Z7951 Long term (current) use of inhaled steroids: Secondary | ICD-10-CM | POA: Diagnosis not present

## 2023-12-16 DIAGNOSIS — Z7984 Long term (current) use of oral hypoglycemic drugs: Secondary | ICD-10-CM | POA: Diagnosis not present

## 2023-12-16 DIAGNOSIS — E1122 Type 2 diabetes mellitus with diabetic chronic kidney disease: Secondary | ICD-10-CM | POA: Diagnosis not present

## 2023-12-16 DIAGNOSIS — M4802 Spinal stenosis, cervical region: Secondary | ICD-10-CM | POA: Diagnosis not present

## 2023-12-16 DIAGNOSIS — Z79891 Long term (current) use of opiate analgesic: Secondary | ICD-10-CM | POA: Diagnosis not present

## 2023-12-16 DIAGNOSIS — Z853 Personal history of malignant neoplasm of breast: Secondary | ICD-10-CM | POA: Diagnosis not present

## 2023-12-16 DIAGNOSIS — F411 Generalized anxiety disorder: Secondary | ICD-10-CM | POA: Diagnosis not present

## 2023-12-16 DIAGNOSIS — D509 Iron deficiency anemia, unspecified: Secondary | ICD-10-CM | POA: Diagnosis not present

## 2023-12-16 DIAGNOSIS — G959 Disease of spinal cord, unspecified: Secondary | ICD-10-CM | POA: Diagnosis not present

## 2023-12-16 DIAGNOSIS — Z9181 History of falling: Secondary | ICD-10-CM | POA: Diagnosis not present

## 2023-12-16 DIAGNOSIS — I2510 Atherosclerotic heart disease of native coronary artery without angina pectoris: Secondary | ICD-10-CM | POA: Diagnosis not present

## 2023-12-18 DIAGNOSIS — Z9181 History of falling: Secondary | ICD-10-CM | POA: Diagnosis not present

## 2023-12-18 DIAGNOSIS — G959 Disease of spinal cord, unspecified: Secondary | ICD-10-CM | POA: Diagnosis not present

## 2023-12-18 DIAGNOSIS — E1122 Type 2 diabetes mellitus with diabetic chronic kidney disease: Secondary | ICD-10-CM | POA: Diagnosis not present

## 2023-12-18 DIAGNOSIS — M1990 Unspecified osteoarthritis, unspecified site: Secondary | ICD-10-CM | POA: Diagnosis not present

## 2023-12-18 DIAGNOSIS — I129 Hypertensive chronic kidney disease with stage 1 through stage 4 chronic kidney disease, or unspecified chronic kidney disease: Secondary | ICD-10-CM | POA: Diagnosis not present

## 2023-12-18 DIAGNOSIS — M4802 Spinal stenosis, cervical region: Secondary | ICD-10-CM | POA: Diagnosis not present

## 2023-12-18 DIAGNOSIS — E785 Hyperlipidemia, unspecified: Secondary | ICD-10-CM | POA: Diagnosis not present

## 2023-12-18 DIAGNOSIS — D509 Iron deficiency anemia, unspecified: Secondary | ICD-10-CM | POA: Diagnosis not present

## 2023-12-18 DIAGNOSIS — M48061 Spinal stenosis, lumbar region without neurogenic claudication: Secondary | ICD-10-CM | POA: Diagnosis not present

## 2023-12-22 DIAGNOSIS — J301 Allergic rhinitis due to pollen: Secondary | ICD-10-CM | POA: Diagnosis not present

## 2023-12-22 DIAGNOSIS — J3081 Allergic rhinitis due to animal (cat) (dog) hair and dander: Secondary | ICD-10-CM | POA: Diagnosis not present

## 2023-12-22 DIAGNOSIS — J3089 Other allergic rhinitis: Secondary | ICD-10-CM | POA: Diagnosis not present

## 2023-12-23 DIAGNOSIS — D509 Iron deficiency anemia, unspecified: Secondary | ICD-10-CM | POA: Diagnosis not present

## 2023-12-23 DIAGNOSIS — E785 Hyperlipidemia, unspecified: Secondary | ICD-10-CM | POA: Diagnosis not present

## 2023-12-23 DIAGNOSIS — K219 Gastro-esophageal reflux disease without esophagitis: Secondary | ICD-10-CM | POA: Diagnosis not present

## 2023-12-23 DIAGNOSIS — Z7984 Long term (current) use of oral hypoglycemic drugs: Secondary | ICD-10-CM | POA: Diagnosis not present

## 2023-12-23 DIAGNOSIS — F411 Generalized anxiety disorder: Secondary | ICD-10-CM | POA: Diagnosis not present

## 2023-12-23 DIAGNOSIS — Z981 Arthrodesis status: Secondary | ICD-10-CM | POA: Diagnosis not present

## 2023-12-23 DIAGNOSIS — Z853 Personal history of malignant neoplasm of breast: Secondary | ICD-10-CM | POA: Diagnosis not present

## 2023-12-23 DIAGNOSIS — I129 Hypertensive chronic kidney disease with stage 1 through stage 4 chronic kidney disease, or unspecified chronic kidney disease: Secondary | ICD-10-CM | POA: Diagnosis not present

## 2023-12-23 DIAGNOSIS — Z6841 Body Mass Index (BMI) 40.0 and over, adult: Secondary | ICD-10-CM | POA: Diagnosis not present

## 2023-12-23 DIAGNOSIS — E1122 Type 2 diabetes mellitus with diabetic chronic kidney disease: Secondary | ICD-10-CM | POA: Diagnosis not present

## 2023-12-23 DIAGNOSIS — Z7951 Long term (current) use of inhaled steroids: Secondary | ICD-10-CM | POA: Diagnosis not present

## 2023-12-23 DIAGNOSIS — Z9181 History of falling: Secondary | ICD-10-CM | POA: Diagnosis not present

## 2023-12-23 DIAGNOSIS — M4802 Spinal stenosis, cervical region: Secondary | ICD-10-CM | POA: Diagnosis not present

## 2023-12-23 DIAGNOSIS — Z79891 Long term (current) use of opiate analgesic: Secondary | ICD-10-CM | POA: Diagnosis not present

## 2023-12-23 DIAGNOSIS — M1990 Unspecified osteoarthritis, unspecified site: Secondary | ICD-10-CM | POA: Diagnosis not present

## 2023-12-23 DIAGNOSIS — G959 Disease of spinal cord, unspecified: Secondary | ICD-10-CM | POA: Diagnosis not present

## 2023-12-23 DIAGNOSIS — N1832 Chronic kidney disease, stage 3b: Secondary | ICD-10-CM | POA: Diagnosis not present

## 2023-12-23 DIAGNOSIS — J309 Allergic rhinitis, unspecified: Secondary | ICD-10-CM | POA: Diagnosis not present

## 2023-12-23 DIAGNOSIS — M48061 Spinal stenosis, lumbar region without neurogenic claudication: Secondary | ICD-10-CM | POA: Diagnosis not present

## 2023-12-23 DIAGNOSIS — I2510 Atherosclerotic heart disease of native coronary artery without angina pectoris: Secondary | ICD-10-CM | POA: Diagnosis not present

## 2023-12-29 DIAGNOSIS — J309 Allergic rhinitis, unspecified: Secondary | ICD-10-CM | POA: Diagnosis not present

## 2023-12-29 DIAGNOSIS — E785 Hyperlipidemia, unspecified: Secondary | ICD-10-CM | POA: Diagnosis not present

## 2023-12-29 DIAGNOSIS — I129 Hypertensive chronic kidney disease with stage 1 through stage 4 chronic kidney disease, or unspecified chronic kidney disease: Secondary | ICD-10-CM | POA: Diagnosis not present

## 2023-12-29 DIAGNOSIS — E1122 Type 2 diabetes mellitus with diabetic chronic kidney disease: Secondary | ICD-10-CM | POA: Diagnosis not present

## 2023-12-29 DIAGNOSIS — G959 Disease of spinal cord, unspecified: Secondary | ICD-10-CM | POA: Diagnosis not present

## 2023-12-29 DIAGNOSIS — N1832 Chronic kidney disease, stage 3b: Secondary | ICD-10-CM | POA: Diagnosis not present

## 2023-12-29 DIAGNOSIS — K219 Gastro-esophageal reflux disease without esophagitis: Secondary | ICD-10-CM | POA: Diagnosis not present

## 2023-12-29 DIAGNOSIS — Z981 Arthrodesis status: Secondary | ICD-10-CM | POA: Diagnosis not present

## 2023-12-29 DIAGNOSIS — Z79891 Long term (current) use of opiate analgesic: Secondary | ICD-10-CM | POA: Diagnosis not present

## 2023-12-29 DIAGNOSIS — M1990 Unspecified osteoarthritis, unspecified site: Secondary | ICD-10-CM | POA: Diagnosis not present

## 2023-12-29 DIAGNOSIS — Z6841 Body Mass Index (BMI) 40.0 and over, adult: Secondary | ICD-10-CM | POA: Diagnosis not present

## 2023-12-29 DIAGNOSIS — Z9181 History of falling: Secondary | ICD-10-CM | POA: Diagnosis not present

## 2023-12-29 DIAGNOSIS — Z7984 Long term (current) use of oral hypoglycemic drugs: Secondary | ICD-10-CM | POA: Diagnosis not present

## 2023-12-29 DIAGNOSIS — D509 Iron deficiency anemia, unspecified: Secondary | ICD-10-CM | POA: Diagnosis not present

## 2023-12-29 DIAGNOSIS — I2510 Atherosclerotic heart disease of native coronary artery without angina pectoris: Secondary | ICD-10-CM | POA: Diagnosis not present

## 2023-12-29 DIAGNOSIS — Z853 Personal history of malignant neoplasm of breast: Secondary | ICD-10-CM | POA: Diagnosis not present

## 2023-12-29 DIAGNOSIS — M48061 Spinal stenosis, lumbar region without neurogenic claudication: Secondary | ICD-10-CM | POA: Diagnosis not present

## 2023-12-29 DIAGNOSIS — F411 Generalized anxiety disorder: Secondary | ICD-10-CM | POA: Diagnosis not present

## 2023-12-29 DIAGNOSIS — Z7951 Long term (current) use of inhaled steroids: Secondary | ICD-10-CM | POA: Diagnosis not present

## 2023-12-29 DIAGNOSIS — M4802 Spinal stenosis, cervical region: Secondary | ICD-10-CM | POA: Diagnosis not present

## 2023-12-30 ENCOUNTER — Other Ambulatory Visit: Payer: Self-pay | Admitting: Family Medicine

## 2023-12-30 DIAGNOSIS — I10 Essential (primary) hypertension: Secondary | ICD-10-CM

## 2023-12-30 MED ORDER — HYDROCHLOROTHIAZIDE 25 MG PO TABS: 25.0000 mg | ORAL_TABLET | Freq: Every day | ORAL | 1 refills | Status: DC

## 2023-12-30 NOTE — Telephone Encounter (Signed)
 Copied from CRM [id number]. Topic: Clinical - Medication Refill >> Dec 30, 2023 10:46 AM Macario HERO wrote: Medication: hydrochlorothiazide  (HYDRODIURIL ) 25 MG tablet [[id number]]  Has the patient contacted their pharmacy? Yes (Agent: If no, request that the patient contact the pharmacy for the refill. If patient does not wish to contact the pharmacy document the reason why and proceed with request.) (Agent: If yes, when and what did the pharmacy advise?) said out of refill  This is the patient's preferred pharmacy:  [facility] [address], [place] - [address] [place] [address] Phone: [phone] Fax: [phone]  Is this the correct pharmacy for this prescription? Yes If no, delete pharmacy and type the correct one.   Has the prescription been filled recently? Yes  Is the patient out of the medication? Yes  Has the patient been seen for an appointment in the last year OR does the patient have an upcoming appointment? Yes  Can we respond through MyChart? No  Agent: Please be advised that Rx refills may take up to 3 business days. We ask that you follow-up with your pharmacy.

## 2023-12-31 DIAGNOSIS — Z6841 Body Mass Index (BMI) 40.0 and over, adult: Secondary | ICD-10-CM | POA: Diagnosis not present

## 2023-12-31 DIAGNOSIS — I129 Hypertensive chronic kidney disease with stage 1 through stage 4 chronic kidney disease, or unspecified chronic kidney disease: Secondary | ICD-10-CM | POA: Diagnosis not present

## 2023-12-31 DIAGNOSIS — Z7984 Long term (current) use of oral hypoglycemic drugs: Secondary | ICD-10-CM | POA: Diagnosis not present

## 2023-12-31 DIAGNOSIS — I2510 Atherosclerotic heart disease of native coronary artery without angina pectoris: Secondary | ICD-10-CM | POA: Diagnosis not present

## 2023-12-31 DIAGNOSIS — E1122 Type 2 diabetes mellitus with diabetic chronic kidney disease: Secondary | ICD-10-CM | POA: Diagnosis not present

## 2023-12-31 DIAGNOSIS — N1832 Chronic kidney disease, stage 3b: Secondary | ICD-10-CM | POA: Diagnosis not present

## 2023-12-31 DIAGNOSIS — E785 Hyperlipidemia, unspecified: Secondary | ICD-10-CM | POA: Diagnosis not present

## 2023-12-31 DIAGNOSIS — J309 Allergic rhinitis, unspecified: Secondary | ICD-10-CM | POA: Diagnosis not present

## 2023-12-31 DIAGNOSIS — D509 Iron deficiency anemia, unspecified: Secondary | ICD-10-CM | POA: Diagnosis not present

## 2023-12-31 DIAGNOSIS — Z9181 History of falling: Secondary | ICD-10-CM | POA: Diagnosis not present

## 2023-12-31 DIAGNOSIS — Z981 Arthrodesis status: Secondary | ICD-10-CM | POA: Diagnosis not present

## 2023-12-31 DIAGNOSIS — Z853 Personal history of malignant neoplasm of breast: Secondary | ICD-10-CM | POA: Diagnosis not present

## 2023-12-31 DIAGNOSIS — G959 Disease of spinal cord, unspecified: Secondary | ICD-10-CM | POA: Diagnosis not present

## 2023-12-31 DIAGNOSIS — M4802 Spinal stenosis, cervical region: Secondary | ICD-10-CM | POA: Diagnosis not present

## 2023-12-31 DIAGNOSIS — Z79891 Long term (current) use of opiate analgesic: Secondary | ICD-10-CM | POA: Diagnosis not present

## 2023-12-31 DIAGNOSIS — M48061 Spinal stenosis, lumbar region without neurogenic claudication: Secondary | ICD-10-CM | POA: Diagnosis not present

## 2023-12-31 DIAGNOSIS — M1990 Unspecified osteoarthritis, unspecified site: Secondary | ICD-10-CM | POA: Diagnosis not present

## 2023-12-31 DIAGNOSIS — F411 Generalized anxiety disorder: Secondary | ICD-10-CM | POA: Diagnosis not present

## 2023-12-31 DIAGNOSIS — K219 Gastro-esophageal reflux disease without esophagitis: Secondary | ICD-10-CM | POA: Diagnosis not present

## 2023-12-31 DIAGNOSIS — Z7951 Long term (current) use of inhaled steroids: Secondary | ICD-10-CM | POA: Diagnosis not present

## 2024-01-04 ENCOUNTER — Telehealth: Payer: Self-pay

## 2024-01-04 NOTE — Telephone Encounter (Signed)
 Copied from CRM [id number]. Topic: Clinical - Prescription Issue >> Jan 04, 2024 10:28 AM Doris Jackson wrote: Reason for CRM: Patient called in last week for a refill of hydrochlorothiazide . Says she was supposed to be for 12.5 MG and not 25 MG. Patient states she doesn't take 25 mg.  Patient can be reached at [phone]

## 2024-01-05 ENCOUNTER — Ambulatory Visit: Admitting: Family Medicine

## 2024-01-05 ENCOUNTER — Other Ambulatory Visit: Payer: Self-pay | Admitting: Family Medicine

## 2024-01-05 DIAGNOSIS — I10 Essential (primary) hypertension: Secondary | ICD-10-CM

## 2024-01-05 DIAGNOSIS — M5416 Radiculopathy, lumbar region: Secondary | ICD-10-CM | POA: Diagnosis not present

## 2024-01-05 NOTE — Telephone Encounter (Signed)
 Ok just have her cut the pills in half and take 1/2 tablet of hydrochlorothiazide . Then I will call in a new script for her

## 2024-01-05 NOTE — Telephone Encounter (Signed)
Left a detailed message with the instructions below at the patient's cell number.

## 2024-01-06 DIAGNOSIS — J301 Allergic rhinitis due to pollen: Secondary | ICD-10-CM | POA: Diagnosis not present

## 2024-01-06 DIAGNOSIS — J3089 Other allergic rhinitis: Secondary | ICD-10-CM | POA: Diagnosis not present

## 2024-01-06 DIAGNOSIS — J3081 Allergic rhinitis due to animal (cat) (dog) hair and dander: Secondary | ICD-10-CM | POA: Diagnosis not present

## 2024-01-08 ENCOUNTER — Other Ambulatory Visit: Payer: Self-pay | Admitting: Family Medicine

## 2024-01-08 DIAGNOSIS — I10 Essential (primary) hypertension: Secondary | ICD-10-CM

## 2024-01-12 ENCOUNTER — Encounter: Payer: Self-pay | Admitting: Family Medicine

## 2024-01-12 ENCOUNTER — Ambulatory Visit (INDEPENDENT_AMBULATORY_CARE_PROVIDER_SITE_OTHER): Admitting: Family Medicine

## 2024-01-12 VITALS — BP 122/80 | HR 70 | Temp 97.4°F | Ht 62.0 in | Wt 243.3 lb

## 2024-01-12 DIAGNOSIS — I10 Essential (primary) hypertension: Secondary | ICD-10-CM

## 2024-01-12 DIAGNOSIS — F419 Anxiety disorder, unspecified: Secondary | ICD-10-CM

## 2024-01-12 MED ORDER — HYDROCHLOROTHIAZIDE 12.5 MG PO TABS: 12.5000 mg | ORAL_TABLET | Freq: Every day | ORAL | 1 refills | Status: DC

## 2024-01-12 NOTE — Assessment & Plan Note (Signed)
 Pt seems to be improved today on the 5 mg, her speech is a little less pressured today, will continue the 5 mg daily and also the PRN meclizine  for the dizziness.

## 2024-01-12 NOTE — Progress Notes (Signed)
 Established Patient Office Visit  Subjective   Patient ID: Doris Jackson, female    DOB: 1941-02-12  Age: 83 y.o. MRN: [id number]  No chief complaint on file.   Pt is here for follow up today on her anxiety today. She reports she is feeling better, states she had an injection in her back. States that she is taking 2 tablets of the meclizine  daily and also the 5 mg of the lexapro  today. States she thinks that the medication is helping with her anxiety, states she is sleeping some better as well. She denies any side effects to the medications. States she went to see the ENT doctor for an evaluation and they recommended she start drinking bone broth, she is still in physical therapy. States she would like to continue the 5 mg dose for now.   HTN -- BP in office performed and is well controlled. She  reports no side effects to the medications, no chest pain, SOB, dizziness or headaches. She has a BP cuff at home and is checking BP regularly, reports they are in the normal range. I again reviewed the medications with the patient, she is reporting that she is only taking 1/2 tablet of the hydrochlorothiazide . States that this was reduce in the past, will send a new script for the 12.5 mg dose.        Current Outpatient Medications  Medication Instructions   acetaminophen  (TYLENOL ) 500 mg, Every 6 hours PRN   amLODipine  (NORVASC ) 10 mg, Oral, Daily   benzonatate  (TESSALON ) 200 mg, 3 times daily PRN   Blood Glucose Monitoring Suppl (ONETOUCH VERIO REFLECT) w/Device KIT USE UP TO FOUR TIMES DAILY AS DIRECTED.   Blood Pressure Monitoring (BLOOD PRESSURE CUFF) MISC 1 each, Does not apply, Daily   cyanocobalamin  2,000 mcg, Daily   diclofenac  Sodium (VOLTAREN ) 2 g, Topical, 4 times daily   escitalopram  (LEXAPRO ) 5 mg, Oral, Daily   famotidine  (PEPCID ) 20 mg, Oral, Daily at bedtime   fexofenadine (ALLEGRA) 180 mg, Daily PRN   fluticasone  (FLONASE ) 50 MCG/ACT nasal spray 1 spray, Each Nare, Daily    furosemide  (LASIX ) 20 mg, Oral, Daily PRN   glipiZIDE  (GLUCOTROL ) 5 mg, Oral, Daily before breakfast   glucose blood (ONETOUCH VERIO) test strip USE 1 STRIP TO CHECK GLUCOSE 2-4 TIMES DAILY AS NEEDED    hydrochlorothiazide  (HYDRODIURIL ) 12.5 mg, Oral, Daily   HYDROcodone -acetaminophen  (NORCO) 5-325 MG tablet 1 tablet, Oral, Daily PRN   iron  polysaccharides (FERREX 150) 150 mg, Oral, Daily   Lancets 30G MISC USE TWICE DAILY AS DIRECTED FOR GLUCOSE TESTING AND MONITORING   Lancets MISC Use as directed   meclizine  (ANTIVERT ) 12.5 mg, Oral, 3 times daily PRN   metFORMIN  (GLUCOPHAGE -XR) 500 mg, Oral, 2 times daily   metoprolol  succinate (TOPROL -XL) 50 mg, Oral, Daily, Take with or immediately following a meal.   ONE TOUCH LANCETS MISC Test twice daily.   OneTouch Delica Lancets 33G MISC Use as directed once a day   pantoprazole  (PROTONIX ) 20 mg, Oral, Daily   pioglitazone  (ACTOS ) 45 MG tablet TAKE 1 TABLET BY MOUTH ONCE DAILY   pravastatin  (PRAVACHOL ) 40 mg, Oral, Daily   prednisoLONE  acetate (PRED FORTE ) 1 % ophthalmic suspension 1 drop, See admin instructions   Probiotic Product (PROBIOTIC-10 PO) 1 capsule, Daily   senna-docusate (SENOKOT-S) 8.6-50 MG tablet 1 tablet, Oral, Daily at bedtime   telmisartan  (MICARDIS ) 20 MG tablet Take 1/2 (one-half) tablet by mouth once daily   vitamin C 1,000 mg, Daily  Vitamin D  4,000 Units, Daily    Patient Active Problem List   Diagnosis Date Noted   Benign paroxysmal positional vertigo 11/08/2023   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/2022   Anxiety    Cervical myelopathy (HCC) 09/22/2019   S/P cervical spinal fusion 09/22/2019   GERD (gastroesophageal reflux disease) 09/14/2019   Spinal stenosis in cervical region 09/14/2019   Numbness and tingling 09/14/2019   Status post cataract extraction and insertion of intraocular lens of left eye 09/04/2019   Spinal stenosis of lumbar region with neurogenic claudication 08/24/2019   Persistent  cough 06/06/2019   History of Descemet membrane endothelial keratoplasty (DMEK) 01/04/2019   Status post cataract extraction and insertion of intraocular lens of right eye 01/04/2019   Senile nuclear sclerosis 01/02/2019   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     Objective:     BP 122/80   Pulse 70   Temp (!) 97.4 F (36.3 C) (Oral)   Ht 5' 2 (1.575 m)   Wt 243 lb 4.8 oz (110.4 kg)   SpO2 99%   BMI 44.50 kg/m    Physical Exam Vitals reviewed.  Constitutional:      Appearance: Normal appearance. She is morbidly obese.  Cardiovascular:     Pulses: Normal pulses.  Pulmonary:     Effort: Pulmonary effort is normal.  Neurological:     Mental Status: She is alert and oriented to person, place, and time.  Psychiatric:        Mood and Affect: Mood normal.        Behavior: Behavior normal.      No results found for any visits on 01/12/24.    The ASCVD Risk score (Arnett DK, et al., 2019) failed to calculate for the following reasons:   The 2019 ASCVD risk score is only valid for ages 86 to 67    Assessment & Plan:  Anxiety Assessment & Plan: Pt seems to be improved today on the 5 mg, her speech is a little less pressured today, will continue the 5 mg daily and also the PRN meclizine  for the dizziness.    Essential hypertension Assessment & Plan: Chronic, BP back to controlled status. Last visit we had put her back on a small dose of ARB and this appears to be controlling her blood  pressure. Continue all medications as prescribed.  Current hypertension medications:       Sig   amLODipine  (NORVASC ) 10 MG tablet Take 1 tablet (10 mg total) by mouth daily.    Patient not taking: Reported on 11/04/2023   furosemide  (LASIX ) 20 MG tablet Take 1 tablet (20 mg total) by mouth daily as needed.   hydrochlorothiazide  (HYDRODIURIL ) 12.5 MG tablet Take 1 tablet (12.5 mg total) by mouth daily.   metoprolol  succinate (TOPROL -XL) 50 MG 24 hr tablet Take 1 tablet (50 mg total) by mouth daily. Take with or immediately following a meal.   telmisartan  (MICARDIS ) 20 MG tablet Take 1/2 (one-half) tablet by mouth once daily  BP today in office is well controlled, will continue the above medications, reviewed these again with patient today to confirm this is what she is taking. Adjusted the hydrochlorothiazide  dose to reflect what she is taking at home  Orders: -     hydroCHLOROthiazide ; Take 1 tablet (12.5 mg total) by mouth daily.  Dispense: 45 tablet; Refill: 1     Return in about 4 months (around 05/14/2024) for A1C check.    Heron CHRISTELLA Sharper, MD

## 2024-01-13 DIAGNOSIS — Z7951 Long term (current) use of inhaled steroids: Secondary | ICD-10-CM | POA: Diagnosis not present

## 2024-01-13 DIAGNOSIS — Z7984 Long term (current) use of oral hypoglycemic drugs: Secondary | ICD-10-CM | POA: Diagnosis not present

## 2024-01-13 DIAGNOSIS — Z981 Arthrodesis status: Secondary | ICD-10-CM | POA: Diagnosis not present

## 2024-01-13 DIAGNOSIS — E785 Hyperlipidemia, unspecified: Secondary | ICD-10-CM | POA: Diagnosis not present

## 2024-01-13 DIAGNOSIS — K219 Gastro-esophageal reflux disease without esophagitis: Secondary | ICD-10-CM | POA: Diagnosis not present

## 2024-01-13 DIAGNOSIS — N1832 Chronic kidney disease, stage 3b: Secondary | ICD-10-CM | POA: Diagnosis not present

## 2024-01-13 DIAGNOSIS — G959 Disease of spinal cord, unspecified: Secondary | ICD-10-CM | POA: Diagnosis not present

## 2024-01-13 DIAGNOSIS — Z6841 Body Mass Index (BMI) 40.0 and over, adult: Secondary | ICD-10-CM | POA: Diagnosis not present

## 2024-01-13 DIAGNOSIS — Z853 Personal history of malignant neoplasm of breast: Secondary | ICD-10-CM | POA: Diagnosis not present

## 2024-01-13 DIAGNOSIS — D509 Iron deficiency anemia, unspecified: Secondary | ICD-10-CM | POA: Diagnosis not present

## 2024-01-13 DIAGNOSIS — I2510 Atherosclerotic heart disease of native coronary artery without angina pectoris: Secondary | ICD-10-CM | POA: Diagnosis not present

## 2024-01-13 DIAGNOSIS — J309 Allergic rhinitis, unspecified: Secondary | ICD-10-CM | POA: Diagnosis not present

## 2024-01-13 DIAGNOSIS — E1122 Type 2 diabetes mellitus with diabetic chronic kidney disease: Secondary | ICD-10-CM | POA: Diagnosis not present

## 2024-01-13 DIAGNOSIS — M1990 Unspecified osteoarthritis, unspecified site: Secondary | ICD-10-CM | POA: Diagnosis not present

## 2024-01-13 DIAGNOSIS — Z9181 History of falling: Secondary | ICD-10-CM | POA: Diagnosis not present

## 2024-01-13 DIAGNOSIS — I129 Hypertensive chronic kidney disease with stage 1 through stage 4 chronic kidney disease, or unspecified chronic kidney disease: Secondary | ICD-10-CM | POA: Diagnosis not present

## 2024-01-13 DIAGNOSIS — M48061 Spinal stenosis, lumbar region without neurogenic claudication: Secondary | ICD-10-CM | POA: Diagnosis not present

## 2024-01-13 DIAGNOSIS — F411 Generalized anxiety disorder: Secondary | ICD-10-CM | POA: Diagnosis not present

## 2024-01-13 DIAGNOSIS — Z79891 Long term (current) use of opiate analgesic: Secondary | ICD-10-CM | POA: Diagnosis not present

## 2024-01-13 DIAGNOSIS — M4802 Spinal stenosis, cervical region: Secondary | ICD-10-CM | POA: Diagnosis not present

## 2024-01-14 ENCOUNTER — Other Ambulatory Visit: Payer: Self-pay | Admitting: Family Medicine

## 2024-01-14 DIAGNOSIS — E1129 Type 2 diabetes mellitus with other diabetic kidney complication: Secondary | ICD-10-CM

## 2024-01-19 DIAGNOSIS — J3089 Other allergic rhinitis: Secondary | ICD-10-CM | POA: Diagnosis not present

## 2024-01-19 DIAGNOSIS — J3081 Allergic rhinitis due to animal (cat) (dog) hair and dander: Secondary | ICD-10-CM | POA: Diagnosis not present

## 2024-01-19 DIAGNOSIS — J301 Allergic rhinitis due to pollen: Secondary | ICD-10-CM | POA: Diagnosis not present

## 2024-01-20 ENCOUNTER — Institutional Professional Consult (permissible substitution) (INDEPENDENT_AMBULATORY_CARE_PROVIDER_SITE_OTHER): Admitting: Otolaryngology

## 2024-01-20 NOTE — Assessment & Plan Note (Signed)
 Chronic, BP back to controlled status. Last visit we had put her back on a small dose of ARB and this appears to be controlling her blood pressure. Continue all medications as prescribed.  Current hypertension medications:       Sig   amLODipine  (NORVASC ) 10 MG tablet Take 1 tablet (10 mg total) by mouth daily.    Patient not taking: Reported on 11/04/2023   furosemide  (LASIX ) 20 MG tablet Take 1 tablet (20 mg total) by mouth daily as needed.   hydrochlorothiazide  (HYDRODIURIL ) 12.5 MG tablet Take 1 tablet (12.5 mg total) by mouth daily.   metoprolol  succinate (TOPROL -XL) 50 MG 24 hr tablet Take 1 tablet (50 mg total) by mouth daily. Take with or immediately following a meal.   telmisartan  (MICARDIS ) 20 MG tablet Take 1/2 (one-half) tablet by mouth once daily      BP today in office is well controlled, will continue the above medications, reviewed these again with patient today to confirm this is what she is taking. Adjusted the hydrochlorothiazide  dose to reflect what she is taking at home

## 2024-01-23 ENCOUNTER — Other Ambulatory Visit: Payer: Self-pay | Admitting: Family Medicine

## 2024-01-23 DIAGNOSIS — H8110 Benign paroxysmal vertigo, unspecified ear: Secondary | ICD-10-CM

## 2024-01-25 ENCOUNTER — Other Ambulatory Visit: Payer: Self-pay | Admitting: Family Medicine

## 2024-01-25 DIAGNOSIS — J309 Allergic rhinitis, unspecified: Secondary | ICD-10-CM | POA: Diagnosis not present

## 2024-01-25 DIAGNOSIS — Z9181 History of falling: Secondary | ICD-10-CM | POA: Diagnosis not present

## 2024-01-25 DIAGNOSIS — G959 Disease of spinal cord, unspecified: Secondary | ICD-10-CM | POA: Diagnosis not present

## 2024-01-25 DIAGNOSIS — I129 Hypertensive chronic kidney disease with stage 1 through stage 4 chronic kidney disease, or unspecified chronic kidney disease: Secondary | ICD-10-CM | POA: Diagnosis not present

## 2024-01-25 DIAGNOSIS — M48061 Spinal stenosis, lumbar region without neurogenic claudication: Secondary | ICD-10-CM | POA: Diagnosis not present

## 2024-01-25 DIAGNOSIS — E1122 Type 2 diabetes mellitus with diabetic chronic kidney disease: Secondary | ICD-10-CM | POA: Diagnosis not present

## 2024-01-25 DIAGNOSIS — D509 Iron deficiency anemia, unspecified: Secondary | ICD-10-CM | POA: Diagnosis not present

## 2024-01-25 DIAGNOSIS — F411 Generalized anxiety disorder: Secondary | ICD-10-CM | POA: Diagnosis not present

## 2024-01-25 DIAGNOSIS — E785 Hyperlipidemia, unspecified: Secondary | ICD-10-CM | POA: Diagnosis not present

## 2024-01-25 DIAGNOSIS — Z7951 Long term (current) use of inhaled steroids: Secondary | ICD-10-CM | POA: Diagnosis not present

## 2024-01-25 DIAGNOSIS — N1832 Chronic kidney disease, stage 3b: Secondary | ICD-10-CM | POA: Diagnosis not present

## 2024-01-25 DIAGNOSIS — M4802 Spinal stenosis, cervical region: Secondary | ICD-10-CM | POA: Diagnosis not present

## 2024-01-25 DIAGNOSIS — Z6841 Body Mass Index (BMI) 40.0 and over, adult: Secondary | ICD-10-CM | POA: Diagnosis not present

## 2024-01-25 DIAGNOSIS — F419 Anxiety disorder, unspecified: Secondary | ICD-10-CM

## 2024-01-25 DIAGNOSIS — M1990 Unspecified osteoarthritis, unspecified site: Secondary | ICD-10-CM | POA: Diagnosis not present

## 2024-01-25 DIAGNOSIS — K219 Gastro-esophageal reflux disease without esophagitis: Secondary | ICD-10-CM | POA: Diagnosis not present

## 2024-01-25 DIAGNOSIS — Z981 Arthrodesis status: Secondary | ICD-10-CM | POA: Diagnosis not present

## 2024-01-25 DIAGNOSIS — I2510 Atherosclerotic heart disease of native coronary artery without angina pectoris: Secondary | ICD-10-CM | POA: Diagnosis not present

## 2024-01-25 DIAGNOSIS — Z79891 Long term (current) use of opiate analgesic: Secondary | ICD-10-CM | POA: Diagnosis not present

## 2024-01-25 DIAGNOSIS — Z853 Personal history of malignant neoplasm of breast: Secondary | ICD-10-CM | POA: Diagnosis not present

## 2024-01-25 DIAGNOSIS — Z7984 Long term (current) use of oral hypoglycemic drugs: Secondary | ICD-10-CM | POA: Diagnosis not present

## 2024-01-27 ENCOUNTER — Other Ambulatory Visit: Payer: Self-pay | Admitting: Family Medicine

## 2024-01-27 DIAGNOSIS — R600 Localized edema: Secondary | ICD-10-CM

## 2024-01-29 ENCOUNTER — Telehealth: Payer: Self-pay | Admitting: *Deleted

## 2024-01-29 DIAGNOSIS — E1129 Type 2 diabetes mellitus with other diabetic kidney complication: Secondary | ICD-10-CM

## 2024-01-29 MED ORDER — ONETOUCH VERIO VI STRP
ORAL_STRIP | 1 refills | Status: DC
Start: 2024-01-29 — End: 2024-05-16

## 2024-01-29 NOTE — Telephone Encounter (Signed)
 Rx done.

## 2024-02-01 ENCOUNTER — Telehealth: Payer: Self-pay

## 2024-02-01 ENCOUNTER — Other Ambulatory Visit (HOSPITAL_COMMUNITY): Payer: Self-pay

## 2024-02-01 NOTE — Telephone Encounter (Signed)
 Pharmacy Patient Advocate Encounter   Received notification from Onbase that prior authorization for OneTouch Verio strips  is required/requested.   Insurance verification completed.   The patient is insured through [employer]   Per test claim: PA required; PA submitted to above mentioned insurance via CoverMyMeds Key/confirmation #/EOC [id number] Status is pending

## 2024-02-02 ENCOUNTER — Other Ambulatory Visit (HOSPITAL_COMMUNITY): Payer: Self-pay

## 2024-02-02 DIAGNOSIS — J3089 Other allergic rhinitis: Secondary | ICD-10-CM | POA: Diagnosis not present

## 2024-02-02 DIAGNOSIS — J3081 Allergic rhinitis due to animal (cat) (dog) hair and dander: Secondary | ICD-10-CM | POA: Diagnosis not present

## 2024-02-02 DIAGNOSIS — J301 Allergic rhinitis due to pollen: Secondary | ICD-10-CM | POA: Diagnosis not present

## 2024-02-02 NOTE — Telephone Encounter (Signed)
 Pharmacy Patient Advocate Encounter  Received notification from [employer] that Prior Authorization for OneTouch Verio strips  has been APPROVED from 02/01/24 to 01/31/25. Ran test claim, Copay is $0. This test claim was processed through [facility]- copay amounts may vary at other pharmacies due to pharmacy/plan contracts, or as the patient moves through the different stages of their insurance plan.   PA #/Case ID/Reference #:  [id number]

## 2024-02-03 DIAGNOSIS — Z833 Family history of diabetes mellitus: Secondary | ICD-10-CM | POA: Diagnosis not present

## 2024-02-03 DIAGNOSIS — K219 Gastro-esophageal reflux disease without esophagitis: Secondary | ICD-10-CM | POA: Diagnosis not present

## 2024-02-03 DIAGNOSIS — E1142 Type 2 diabetes mellitus with diabetic polyneuropathy: Secondary | ICD-10-CM | POA: Diagnosis not present

## 2024-02-03 DIAGNOSIS — M1990 Unspecified osteoarthritis, unspecified site: Secondary | ICD-10-CM | POA: Diagnosis not present

## 2024-02-03 DIAGNOSIS — I10 Essential (primary) hypertension: Secondary | ICD-10-CM | POA: Diagnosis not present

## 2024-02-03 DIAGNOSIS — Z7984 Long term (current) use of oral hypoglycemic drugs: Secondary | ICD-10-CM | POA: Diagnosis not present

## 2024-02-03 DIAGNOSIS — Z809 Family history of malignant neoplasm, unspecified: Secondary | ICD-10-CM | POA: Diagnosis not present

## 2024-02-03 DIAGNOSIS — R32 Unspecified urinary incontinence: Secondary | ICD-10-CM | POA: Diagnosis not present

## 2024-02-03 DIAGNOSIS — E785 Hyperlipidemia, unspecified: Secondary | ICD-10-CM | POA: Diagnosis not present

## 2024-02-03 DIAGNOSIS — Z8249 Family history of ischemic heart disease and other diseases of the circulatory system: Secondary | ICD-10-CM | POA: Diagnosis not present

## 2024-02-03 DIAGNOSIS — I2510 Atherosclerotic heart disease of native coronary artery without angina pectoris: Secondary | ICD-10-CM | POA: Diagnosis not present

## 2024-02-05 ENCOUNTER — Other Ambulatory Visit: Payer: Self-pay | Admitting: Family Medicine

## 2024-02-05 DIAGNOSIS — I10 Essential (primary) hypertension: Secondary | ICD-10-CM

## 2024-02-16 DIAGNOSIS — J301 Allergic rhinitis due to pollen: Secondary | ICD-10-CM | POA: Diagnosis not present

## 2024-02-16 DIAGNOSIS — J3089 Other allergic rhinitis: Secondary | ICD-10-CM | POA: Diagnosis not present

## 2024-02-16 DIAGNOSIS — J3081 Allergic rhinitis due to animal (cat) (dog) hair and dander: Secondary | ICD-10-CM | POA: Diagnosis not present

## 2024-02-17 ENCOUNTER — Ambulatory Visit: Admitting: Podiatry

## 2024-02-17 DIAGNOSIS — L853 Xerosis cutis: Secondary | ICD-10-CM

## 2024-02-17 DIAGNOSIS — M79675 Pain in left toe(s): Secondary | ICD-10-CM | POA: Diagnosis not present

## 2024-02-17 DIAGNOSIS — B351 Tinea unguium: Secondary | ICD-10-CM | POA: Diagnosis not present

## 2024-02-17 DIAGNOSIS — M79674 Pain in right toe(s): Secondary | ICD-10-CM

## 2024-02-17 MED ORDER — AMMONIUM LACTATE 12 % EX LOTN: 1.0000 | TOPICAL_LOTION | CUTANEOUS | 0 refills | Status: AC | PRN

## 2024-02-17 NOTE — Progress Notes (Signed)
  Subjective:  Patient ID: Doris Jackson, female    DOB: 03/17/1941,  MRN: [id number]  Chief Complaint  Patient presents with   Nail Problem    Long thick discolored nails    83 y.o. female returns for the above complaint.  Patient presents with thickened onychodystrophy mycotic toenails x 10 mild pain on palpation arch and with ambulation is with pressure she would like for me to debride down she also has secondary complaint of dry skin.  She states been present for quite some time.  She wanted to discuss treatment options for this  Objective:  There were no vitals filed for this visit. Podiatric Exam: Vascular: dorsalis pedis and posterior tibial pulses are palpable bilateral. Capillary return is immediate. Temperature gradient is WNL. Skin turgor WNL  Sensorium: Normal Semmes Weinstein monofilament test. Normal tactile sensation bilaterally. Nail Exam: Pt has thick disfigured discolored nails with subungual debris noted bilateral entire nail hallux through fifth toenails.  Pain on palpation to the nails. Ulcer Exam: There is no evidence of ulcer or pre-ulcerative changes or infection. Orthopedic Exam: Muscle tone and strength are WNL. No limitations in general ROM. No crepitus or effusions noted.  Skin: No Porokeratosis. No infection or ulcers.  Xerosis skin noted to bilateral lower extremity no open wounds or lesion    Assessment & Plan:   1. Pain due to onychomycosis of toenails of both feet   2. Xerosis of skin     Patient was evaluated and treated and all questions answered.  Xerosis skin bilateral - Given the amount of xerosis present patient will benefit from ammonium lactate  ammonium lactate  was sent to the pharmacy  Onychomycosis with pain  -Nails palliatively debrided as below. -Educated on self-care  Procedure: Nail Debridement Rationale: pain  Type of Debridement: manual, sharp debridement. Instrumentation: Nail nipper, rotary burr. Number of Nails:  10  Procedures and Treatment: Consent by patient was obtained for treatment procedures. The patient understood the discussion of treatment and procedures well. All questions were answered thoroughly reviewed. Debridement of mycotic and hypertrophic toenails, 1 through 5 bilateral and clearing of subungual debris. No ulceration, no infection noted.  Return Visit-Office Procedure: Patient instructed to return to the office for a follow up visit 3 months for continued evaluation and treatment.  Franky Blanch, DPM    Return in about 3 months (around 05/19/2024) for RFC .

## 2024-03-01 DIAGNOSIS — J301 Allergic rhinitis due to pollen: Secondary | ICD-10-CM | POA: Diagnosis not present

## 2024-03-01 DIAGNOSIS — J3081 Allergic rhinitis due to animal (cat) (dog) hair and dander: Secondary | ICD-10-CM | POA: Diagnosis not present

## 2024-03-01 DIAGNOSIS — J3089 Other allergic rhinitis: Secondary | ICD-10-CM | POA: Diagnosis not present

## 2024-03-03 ENCOUNTER — Ambulatory Visit: Payer: Self-pay

## 2024-03-03 ENCOUNTER — Other Ambulatory Visit: Payer: Self-pay | Admitting: Family Medicine

## 2024-03-03 DIAGNOSIS — I10 Essential (primary) hypertension: Secondary | ICD-10-CM

## 2024-03-03 NOTE — Telephone Encounter (Signed)
 FYI Only or Action Required?: FYI only for provider.  Patient was last seen in primary care on 01/12/2024 by Doris Heron HERO, MD.  Called Nurse Triage reporting Foot Swelling.  Symptoms began several months ago.  Interventions attempted: Rest, hydration, or home remedies.  Symptoms are: gradually worsening.  Triage Disposition: See PCP When Office is Open (Within 3 Days)  Patient/caregiver understands and will follow disposition?: Yes              Copied from CRM [id number]. Topic: Clinical - Red Word Triage >> Mar 03, 2024  4:01 PM Doris Jackson wrote: Red Word that prompted transfer to Nurse Triage: Pt is having issues with swelling and pain both ankles and feet. She has been taking her fluid pills and it helps a little bit but the swelling is still there. Warm transfer to nurse triage. Reason for Disposition  MODERATE ankle swelling (e.g., interferes with normal activities, can't move joint normally) (Exceptions: Itchy, localized swelling; swelling is chronic.)  Answer Assessment - Initial Assessment Questions Swelling increases throughout the day. Patient states her foot feels heavy. On 3 new medications for anxiety, dizziness and BP and states symptoms started after starting these. Drinking water  and wearing compression stocking for symptoms. Denies chest pain, fever, difficulty breathing, and calf pain.     1. LOCATION: Which ankle is swollen? Where is the swelling?     Both  2. ONSET: When did the swelling start?     1-2 months  3. SWELLING: How bad is the swelling? Or, How large is it? (e.g., mild, moderate, severe; size of localized swelling)      Moderate  4. PAIN: Is there any pain? If Yes, ask: How bad is it? (Scale 0-10; or none, mild, moderate, severe)     R foot/Leg has some shooting pain occasionally  5. CAUSE: What do you think caused the ankle swelling?     New medications, not moving around as much  6. OTHER SYMPTOMS: Do you have any  other symptoms? (e.g., fever, chest pain, difficulty breathing, calf pain)     Wheezing when waking up in the morning  Protocols used: Ankle Swelling-A-AH

## 2024-03-03 NOTE — Telephone Encounter (Signed)
 Appt scheduled on 9/9 with Dr Micheal.

## 2024-03-07 NOTE — Telephone Encounter (Signed)
 Noted- ok to close.

## 2024-03-08 ENCOUNTER — Encounter: Payer: Self-pay | Admitting: Family Medicine

## 2024-03-08 ENCOUNTER — Ambulatory Visit (INDEPENDENT_AMBULATORY_CARE_PROVIDER_SITE_OTHER): Admitting: Family Medicine

## 2024-03-08 VITALS — BP 136/70 | HR 81 | Temp 97.5°F | Wt 252.6 lb

## 2024-03-08 DIAGNOSIS — R600 Localized edema: Secondary | ICD-10-CM | POA: Diagnosis not present

## 2024-03-08 DIAGNOSIS — I10 Essential (primary) hypertension: Secondary | ICD-10-CM

## 2024-03-08 MED ORDER — AMLODIPINE BESYLATE 5 MG PO TABS: 5.0000 mg | ORAL_TABLET | Freq: Every day | ORAL | 3 refills | Status: DC

## 2024-03-08 NOTE — Progress Notes (Signed)
 Established Patient Office Visit  Subjective   Patient ID: Doris Jackson, female    DOB: 16-Feb-1941  Age: 83 y.o. MRN: [id number]  Chief Complaint  Patient presents with   Edema    HPI   Doris Jackson is seen today as a work in with some progressive bilateral lower extremity edema.  She has chronic problems including hypertension, GERD, type 2 diabetes, osteoarthritis, chronic kidney disease stage III, history of breast cancer, dyslipidemia, morbid obesity.  Her weight is up about 9 pounds from her last visit here in July.  She does not have any known history of heart failure.  No orthopnea.  Very sedentary.  No dyspnea at rest.  Medications reviewed.  She is on at least couple medications that could be contributing significantly to her edema including amlodipine  10 mg daily and Actos  45 mg daily.  She does take HCTZ and also furosemide  20 mg about 1 every other day.  Tries to watch her salt intake closely.  Edema is usually worse late in the day.  She has been using some compression garments and trying to elevate legs frequently  Diabetes relatively stable with most recent A1c 7.1% in April.  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   HYPERLIPIDEMIA 06/17/2010   HYPERTENSION 06/17/2010   Leg swelling    LOW BACK PAIN 06/17/2010   OSTEOARTHRITIS 06/17/2010   OSTEOPENIA 06/17/2010   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   Weakness    Past Surgical History:  Procedure Laterality Date   ABDOMINAL HYSTERECTOMY  1988    TAH,LSO   ANTERIOR CERVICAL DECOMP/DISCECTOMY FUSION N/A 09/15/2019   Procedure: Cervical five CorpectomyANTERIOR CERVICAL DECOMPRESSION/DISCECTOMY FUSION CERVICAL THREE-CERVICAL Four;  Surgeon: Gillie Duncans, MD;  Location: [facility] OR;  Service: Neurosurgery;  Laterality: N/A;   BREAST LUMPECTOMY Right 2007   LUMPECTOMY FOLLOWED BY RADIATION   COLONOSCOPY     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 She has never used smokeless tobacco. She reports that she does not drink alcohol and does not use drugs. family history includes Asthma in her father; Diabetes in her brother and mother; Hypertension in her sister. Allergies  Allergen Reactions   Dust Mite Extract Itching and Cough   Latex Itching    Powder gloves    Review of Systems  Constitutional:  Negative for chills, fever and weight loss.  Respiratory:  Negative for  shortness of breath.   Cardiovascular:  Positive for leg swelling. Negative for chest pain, palpitations and orthopnea.      Objective:     BP 136/70   Pulse 81   Temp (!) 97.5 F (36.4 C) (Oral)   Wt 252 lb 9.6 oz (114.6 kg)   SpO2 94%   BMI 46.20 kg/m  BP Readings from Last 3 Encounters:  03/08/24 136/70  01/12/24 122/80  11/04/23 138/82   Wt Readings from Last 3 Encounters:  03/08/24 252 lb 9.6 oz (114.6 kg)  01/12/24 243 lb 4.8 oz (110.4 kg)  11/04/23 235 lb 4.8 oz (106.7 kg)      Physical Exam Vitals reviewed.   Constitutional:      General: She is not in acute distress.    Appearance: She is not ill-appearing.  Cardiovascular:     Rate and Rhythm: Normal rate and regular rhythm.  Pulmonary:     Effort: Pulmonary effort is normal.     Breath sounds: Normal breath sounds. No wheezing or rales.  Musculoskeletal:     Comments: She has knee-high compression garments on bilaterally.  These are removed and she has trace pitting edema lower legs up to about two thirds up the leg bilaterally  Neurological:     Mental Status: She is alert.      No results found for any visits on 03/08/24.    The ASCVD Risk score (Arnett DK, et al., 2019) failed to calculate for the following reasons:   The 2019 ASCVD risk score is only valid for ages 62 to 27    Assessment & Plan:   #1 bilateral leg edema.  Suspect multifactorial.  She is on amlodipine  10 mg and Actos  45 mg.  No prior history of known heart failure.  We suggested the following  -Continue to watch sodium and try to keep daily sodium intake less than 2500 mg - Try reducing amlodipine  back to 5 mg daily from current dose of 10 - Increase furosemide  20 mg to 1 daily for the next 4 or 5 days and then drop back to every other day - Continue to elevate legs and use compression garments - Set up follow-up with primary in a few weeks to reassess.  If edema not improved at that point consider reduction in Actos  and/or additional medication changes if indicated. - Consider echocardiogram if edema not improving  #2 hypertension.  Currently fairly stable.  Recommend increasing telmisartan  to full 20 mg tablet (currently taking 1/2 tablet)-with reduction in amlodipine  dose as above.  Consider possible addition of Aldactone  at follow-up if blood pressure not controlled with changes above   Return in about 3 weeks (around 03/29/2024).    Wolm Scarlet, MD

## 2024-03-08 NOTE — Patient Instructions (Signed)
 STOP the Amlodipine  10 mg and START Amlodipine  5 mg once daily  INCREASE the Telmisartan  20 mg to ONE whole tablet daily  Take Furosemide  20 mg DAILY for 3-4 days.    Set up follow up with Dr Ozell in 3-4 weeks.

## 2024-03-14 ENCOUNTER — Telehealth: Payer: Self-pay

## 2024-03-14 NOTE — Telephone Encounter (Signed)
 She needs to see the podiatrist for the prescription, please let me know if she needs a referral

## 2024-03-14 NOTE — Telephone Encounter (Signed)
 Copied from CRM [id number]. Topic: Clinical - Medication Question >> Mar 14, 2024  2:19 PM Carlatta H wrote: Reason for CRM: Patient would like a prescription left at the front desk for diabetic shoes//Please call to advise   ----------------------------------------------------------------------- From previous Reason for Contact - Prescription Issue: Reason for CRM:

## 2024-03-15 DIAGNOSIS — J301 Allergic rhinitis due to pollen: Secondary | ICD-10-CM | POA: Diagnosis not present

## 2024-03-15 DIAGNOSIS — J3081 Allergic rhinitis due to animal (cat) (dog) hair and dander: Secondary | ICD-10-CM | POA: Diagnosis not present

## 2024-03-15 DIAGNOSIS — J3089 Other allergic rhinitis: Secondary | ICD-10-CM | POA: Diagnosis not present

## 2024-03-15 NOTE — Telephone Encounter (Signed)
 Patient informed of the message below and stated she will contact the podiatrist she was seen by 2 weeks ago.

## 2024-03-16 ENCOUNTER — Other Ambulatory Visit: Payer: Self-pay | Admitting: Family Medicine

## 2024-03-16 DIAGNOSIS — H8110 Benign paroxysmal vertigo, unspecified ear: Secondary | ICD-10-CM

## 2024-03-16 DIAGNOSIS — F419 Anxiety disorder, unspecified: Secondary | ICD-10-CM

## 2024-03-22 ENCOUNTER — Other Ambulatory Visit: Payer: Self-pay | Admitting: Family Medicine

## 2024-03-22 DIAGNOSIS — M48061 Spinal stenosis, lumbar region without neurogenic claudication: Secondary | ICD-10-CM

## 2024-03-22 NOTE — Telephone Encounter (Unsigned)
 Copied from CRM #[id number]. Topic: Clinical - Medication Refill >> Mar 22, 2024  4:44 PM Taleah C wrote: Medication: hydrocodone   Has the patient contacted their pharmacy? Yes Controlled substance  This is the patient's preferred pharmacy:  [facility] [address], [place] - [address] [place] [address] Phone: [phone] Fax: [phone]  Is this the correct pharmacy for this prescription? Yes If no, delete pharmacy and type the correct one.   Has the prescription been filled recently? No  Is the patient out of the medication? No  Has the patient been seen for an appointment in the last year OR does the patient have an upcoming appointment? No  Can we respond through MyChart? No  Agent: Please be advised that Rx refills may take up to 3 business days. We ask that you follow-up with your pharmacy.

## 2024-03-24 ENCOUNTER — Telehealth: Payer: Self-pay | Admitting: Lab

## 2024-03-24 DIAGNOSIS — E1129 Type 2 diabetes mellitus with other diabetic kidney complication: Secondary | ICD-10-CM

## 2024-03-24 DIAGNOSIS — M2041 Other hammer toe(s) (acquired), right foot: Secondary | ICD-10-CM

## 2024-03-24 MED ORDER — HYDROCODONE-ACETAMINOPHEN 5-325 MG PO TABS
1.0000 | ORAL_TABLET | Freq: Every day | ORAL | 0 refills | Status: AC | PRN
Start: 2024-03-24 — End: ?

## 2024-03-24 NOTE — Addendum Note (Signed)
 Addended by: Kieana Livesay on: 03/24/2024 11:12 AM   Modules accepted: Orders

## 2024-03-24 NOTE — Telephone Encounter (Signed)
 Patient left message requesting shoe order.

## 2024-03-29 ENCOUNTER — Ambulatory Visit: Admitting: Family Medicine

## 2024-04-02 ENCOUNTER — Other Ambulatory Visit: Payer: Self-pay | Admitting: Family Medicine

## 2024-04-02 DIAGNOSIS — I10 Essential (primary) hypertension: Secondary | ICD-10-CM

## 2024-04-04 ENCOUNTER — Ambulatory Visit (INDEPENDENT_AMBULATORY_CARE_PROVIDER_SITE_OTHER): Admitting: Family Medicine

## 2024-04-04 ENCOUNTER — Encounter: Payer: Self-pay | Admitting: Family Medicine

## 2024-04-04 VITALS — BP 136/68 | HR 70 | Temp 97.8°F | Ht 62.0 in | Wt 251.8 lb

## 2024-04-04 DIAGNOSIS — J3089 Other allergic rhinitis: Secondary | ICD-10-CM | POA: Diagnosis not present

## 2024-04-04 DIAGNOSIS — Z7984 Long term (current) use of oral hypoglycemic drugs: Secondary | ICD-10-CM

## 2024-04-04 DIAGNOSIS — E1129 Type 2 diabetes mellitus with other diabetic kidney complication: Secondary | ICD-10-CM | POA: Diagnosis not present

## 2024-04-04 DIAGNOSIS — I10 Essential (primary) hypertension: Secondary | ICD-10-CM

## 2024-04-04 DIAGNOSIS — R600 Localized edema: Secondary | ICD-10-CM | POA: Diagnosis not present

## 2024-04-04 DIAGNOSIS — J3081 Allergic rhinitis due to animal (cat) (dog) hair and dander: Secondary | ICD-10-CM | POA: Diagnosis not present

## 2024-04-04 DIAGNOSIS — J301 Allergic rhinitis due to pollen: Secondary | ICD-10-CM | POA: Diagnosis not present

## 2024-04-04 LAB — HEMOGLOBIN A1C: Hgb A1c MFr Bld: 6.4 % (ref 4.6–6.5)

## 2024-04-04 MED ORDER — TELMISARTAN 40 MG PO TABS: 40.0000 mg | ORAL_TABLET | Freq: Every day | ORAL | 1 refills | Status: AC

## 2024-04-04 MED ORDER — METOPROLOL SUCCINATE ER 50 MG PO TB24: 50.0000 mg | ORAL_TABLET | Freq: Every day | ORAL | 1 refills | Status: AC

## 2024-04-04 NOTE — Patient Instructions (Signed)
 STOP glipizide  STOP amlodipine   INCREASE the telmisartan  to 40 mg daily. CONTINUE the hydrochlorothiazide  at 12.5 mg daily

## 2024-04-04 NOTE — Progress Notes (Unsigned)
 Established Patient Office Visit  Subjective   Patient ID: Doris Jackson, female    DOB: [birth year]  Age: 83 y.o. MRN: [id number]  Chief Complaint  Patient presents with  . Medical Management of Chronic Issues    HPI   Current Outpatient Medications  Medication Instructions  . acetaminophen  (TYLENOL ) 500 mg, Every 6 hours PRN  . ammonium lactate  (AMLACTIN DAILY) 12 % lotion 1 Application, Topical, As needed  . benzonatate  (TESSALON ) 200 mg, 3 times daily PRN  . Blood Glucose Monitoring Suppl (ONETOUCH VERIO REFLECT) w/Device KIT USE UP TO FOUR TIMES DAILY AS DIRECTED.  SABRA Blood Pressure Monitoring (BLOOD PRESSURE CUFF) MISC 1 each, Does not apply, Daily  . cyanocobalamin  2,000 mcg, Daily  . diclofenac  Sodium (VOLTAREN ) 2 g, Topical, 4 times daily  . escitalopram  (LEXAPRO ) 5 mg, Oral, Daily  . famotidine  (PEPCID ) 20 mg, Oral, Daily at bedtime  . fexofenadine (ALLEGRA) 180 mg, Daily PRN  . fluticasone  (FLONASE ) 50 MCG/ACT nasal spray 1 spray, Each Nare, Daily  . furosemide  (LASIX ) 20 mg, Oral, Daily PRN  . glucose blood (ONETOUCH VERIO) test strip USE 1 STRIP TO CHECK GLUCOSE 2-4 TIMES DAILY AS NEEDED  . hydrochlorothiazide  (HYDRODIURIL ) 12.5 mg, Oral, Daily  . HYDROcodone -acetaminophen  (NORCO) 5-325 MG tablet 1 tablet, Oral, Daily PRN  . iron  polysaccharides (FERREX 150) 150 mg, Oral, Daily  . Lancets 30G MISC USE TWICE DAILY AS DIRECTED FOR GLUCOSE TESTING AND MONITORING  . Lancets MISC Use as directed  . meclizine  (ANTIVERT ) 12.5 MG tablet TAKE 1 TABLET BY MOUTH THREE TIMES DAILY AS NEEDED FOR DIZZINESS  . metFORMIN  (GLUCOPHAGE -XR) 500 mg, Oral, 2 times daily  . metoprolol  succinate (TOPROL -XL) 50 mg, Oral, Daily, Take with or immediately following a meal.  . ONE TOUCH LANCETS MISC Test twice daily.  SABRA Jan Delica Lancets 33G MISC Use as directed once a day  . pantoprazole  (PROTONIX ) 20 mg, Oral, Daily  . pioglitazone  (ACTOS ) 45 MG tablet TAKE 1 TABLET BY MOUTH ONCE  DAILY  . pravastatin  (PRAVACHOL ) 40 mg, Oral, Daily  . prednisoLONE  acetate (PRED FORTE ) 1 % ophthalmic suspension 1 drop, See admin instructions  . Probiotic Product (PROBIOTIC-10 PO) 1 capsule, Daily  . senna-docusate (SENOKOT-S) 8.6-50 MG tablet 1 tablet, Oral, Daily at bedtime  . telmisartan  (MICARDIS ) 40 mg, Oral, Daily  . vitamin C 1,000 mg, Daily  . Vitamin D  4,000 Units, Daily    Patient Active Problem List   Diagnosis Date Noted  . Benign paroxysmal positional vertigo 11/08/2023  . 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/2022  . Anxiety   . Cervical myelopathy (HCC) 09/22/2019  . S/P cervical spinal fusion 09/22/2019  . GERD (gastroesophageal reflux disease) 09/14/2019  . Spinal stenosis in cervical region 09/14/2019  . Numbness and tingling 09/14/2019  . Status post cataract extraction and insertion of intraocular lens of left eye 09/04/2019  . 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 Persistent cough 06/06/2019  . History of Descemet membrane endothelial keratoplasty (DMEK) 01/04/2019  . Status post cataract extraction and insertion of intraocular lens of right eye 01/04/2019  . Senile nuclear sclerosis 01/02/2019  . 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 Obesity, morbid, BMI 40.0-49.9 (HCC) 04/19/2018  . Cataract   . DM (diabetes mellitus) type II controlled with renal manifestation (HCC) 06/17/2010  . Dyslipidemia 06/17/2010  . Microcytic anemia 06/17/2010  . Essential hypertension 06/17/2010  . Allergic rhinitis 06/17/2010  .  Osteoarthritis 06/17/2010  . LOW BACK PAIN 06/17/2010  . BREAST CANCER, HX OF 06/17/2010     Review of Systems  All other systems reviewed and are negative.     Objective:     BP 136/68   Pulse 70   Temp 97.8 F (36.6 C) (Oral)   Ht 5' 2 (1.575 m)   Wt 251 lb 12.8 oz (114.2 kg)   SpO2 99%   BMI 46.05 kg/m  {Vitals History  (Optional):23777}  Physical Exam Vitals reviewed.  Constitutional:      Appearance: Normal appearance. She is morbidly obese.  Cardiovascular:     Pulses: Normal pulses.  Pulmonary:     Effort: Pulmonary effort is normal.  Neurological:     Mental Status: She is alert and oriented to person, place, and time.  Psychiatric:        Mood and Affect: Mood normal.        Behavior: Behavior normal.      No results found for any visits on 04/04/24.  {Labs (Optional):23779}  The ASCVD Risk score (Arnett DK, et al., 2019) failed to calculate for the following reasons:   The 2019 ASCVD risk score is only valid for ages 99 to 36    Assessment & Plan:  DM (diabetes mellitus) type II controlled with renal manifestation (HCC) -     Basic metabolic panel with GFR; Future -     Hemoglobin A1c; Future -     Microalbumin / creatinine urine ratio; Future  Essential hypertension -     Telmisartan ; Take 1 tablet (40 mg total) by mouth daily.  Dispense: 90 tablet; Refill: 1 -     Metoprolol  Succinate ER; Take 1 tablet (50 mg total) by mouth daily. Take with or immediately following a meal.  Dispense: 90 tablet; Refill: 1     Return in about 3 months (around 07/05/2024) for HTN, DM.    Heron CHRISTELLA Sharper, MD

## 2024-04-05 LAB — MICROALBUMIN / CREATININE URINE RATIO
Creatinine,U: 146.4 mg/dL
Microalb Creat Ratio: UNDETERMINED mg/g (ref 0.0–30.0)
Microalb, Ur: <0.7 mg/dL

## 2024-04-05 LAB — BASIC METABOLIC PANEL WITH GFR
BUN: 32 mg/dL — ABNORMAL HIGH (ref 6–23)
CO2: 26 meq/L (ref 19–32)
Calcium: 9.2 mg/dL (ref 8.4–10.5)
Chloride: 104 meq/L (ref 96–112)
Creatinine, Ser: 1.31 mg/dL — ABNORMAL HIGH (ref 0.40–1.20)
GFR: 38.03 mL/min — ABNORMAL LOW (ref >60.00)
Glucose, Bld: 132 mg/dL — ABNORMAL HIGH (ref 70–99)
Potassium: 4.3 meq/L (ref 3.5–5.1)
Sodium: 140 meq/L (ref 135–145)

## 2024-04-08 ENCOUNTER — Ambulatory Visit: Payer: Self-pay | Admitting: Family Medicine

## 2024-04-08 NOTE — Progress Notes (Signed)
 Urine testing is normal and her blood sugars are well controlled. Her kidney function is slightly worse than previous but overall she is still in the stage 3 of kidney disease which has been tablet since 2022.

## 2024-04-12 DIAGNOSIS — K219 Gastro-esophageal reflux disease without esophagitis: Secondary | ICD-10-CM | POA: Diagnosis not present

## 2024-04-12 DIAGNOSIS — J3089 Other allergic rhinitis: Secondary | ICD-10-CM | POA: Diagnosis not present

## 2024-04-12 DIAGNOSIS — R052 Subacute cough: Secondary | ICD-10-CM | POA: Diagnosis not present

## 2024-04-13 ENCOUNTER — Other Ambulatory Visit: Payer: Self-pay | Admitting: Family Medicine

## 2024-04-13 DIAGNOSIS — H8110 Benign paroxysmal vertigo, unspecified ear: Secondary | ICD-10-CM

## 2024-04-19 ENCOUNTER — Other Ambulatory Visit: Payer: Self-pay | Admitting: Family Medicine

## 2024-04-19 DIAGNOSIS — I10 Essential (primary) hypertension: Secondary | ICD-10-CM

## 2024-04-19 DIAGNOSIS — J301 Allergic rhinitis due to pollen: Secondary | ICD-10-CM | POA: Diagnosis not present

## 2024-04-19 DIAGNOSIS — J3081 Allergic rhinitis due to animal (cat) (dog) hair and dander: Secondary | ICD-10-CM | POA: Diagnosis not present

## 2024-04-19 DIAGNOSIS — J3089 Other allergic rhinitis: Secondary | ICD-10-CM | POA: Diagnosis not present

## 2024-04-20 ENCOUNTER — Ambulatory Visit (INDEPENDENT_AMBULATORY_CARE_PROVIDER_SITE_OTHER)

## 2024-04-20 DIAGNOSIS — Z23 Encounter for immunization: Secondary | ICD-10-CM | POA: Diagnosis not present

## 2024-04-25 ENCOUNTER — Other Ambulatory Visit: Payer: Self-pay | Admitting: Family Medicine

## 2024-04-25 DIAGNOSIS — E785 Hyperlipidemia, unspecified: Secondary | ICD-10-CM

## 2024-04-25 DIAGNOSIS — R600 Localized edema: Secondary | ICD-10-CM

## 2024-05-03 DIAGNOSIS — J3081 Allergic rhinitis due to animal (cat) (dog) hair and dander: Secondary | ICD-10-CM | POA: Diagnosis not present

## 2024-05-03 DIAGNOSIS — J301 Allergic rhinitis due to pollen: Secondary | ICD-10-CM | POA: Diagnosis not present

## 2024-05-03 DIAGNOSIS — J3089 Other allergic rhinitis: Secondary | ICD-10-CM | POA: Diagnosis not present

## 2024-05-04 ENCOUNTER — Other Ambulatory Visit: Payer: Self-pay | Admitting: Family Medicine

## 2024-05-04 DIAGNOSIS — E1129 Type 2 diabetes mellitus with other diabetic kidney complication: Secondary | ICD-10-CM

## 2024-05-13 ENCOUNTER — Other Ambulatory Visit: Payer: Self-pay | Admitting: Family Medicine

## 2024-05-13 DIAGNOSIS — E1129 Type 2 diabetes mellitus with other diabetic kidney complication: Secondary | ICD-10-CM

## 2024-05-16 ENCOUNTER — Other Ambulatory Visit: Payer: Self-pay | Admitting: Family Medicine

## 2024-05-16 DIAGNOSIS — J3089 Other allergic rhinitis: Secondary | ICD-10-CM | POA: Diagnosis not present

## 2024-05-16 DIAGNOSIS — E1129 Type 2 diabetes mellitus with other diabetic kidney complication: Secondary | ICD-10-CM

## 2024-05-16 DIAGNOSIS — J3081 Allergic rhinitis due to animal (cat) (dog) hair and dander: Secondary | ICD-10-CM | POA: Diagnosis not present

## 2024-05-16 DIAGNOSIS — J301 Allergic rhinitis due to pollen: Secondary | ICD-10-CM | POA: Diagnosis not present

## 2024-05-19 ENCOUNTER — Ambulatory Visit: Payer: Self-pay

## 2024-05-19 NOTE — Telephone Encounter (Signed)
 FYI Only or Action Required?: FYI only for provider: appointment scheduled on 05/20/24.  Patient was last seen in primary care on 04/04/2024 by Ozell Heron HERO, MD.  Called Nurse Triage reporting Diarrhea and Hyperglycemia.  Symptoms began 1-2 weeks ago.  Interventions attempted: Rest, hydration, or home remedies and avoiding foods that trigger diarrhea.  Symptoms are: intermittent abdominal cramping preceding diarrhea (mild), decreased appetite, elevated blood sugar readings (145-155) gradually worsening.  Triage Disposition: See PCP When Office is Open (Within 3 Days)  Patient/caregiver understands and will follow disposition?: Yes           Copied from CRM #[id number]. Topic: Clinical - Red Word Triage >> May 19, 2024  2:35 PM Carlyon D wrote: Red Word that prompted transfer to Nurse Triage: pt put on more metFORMIN  (GLUCOPHAGE -XR) 500 MG 24 hr tablet twice a day,  pt has been having  diarrhea and states sugar is staying high  and wants to be put back on her old med. Pt is worried she is going to get dehydrated due to use the bathroom to much Reason for Disposition  [1] MILD diarrhea (e.g., 1-3 or more stools than normal in past 24 hours) AND [2] present >  7 days  (Exception: Chronic diarrhea that is not worse.)  Answer Assessment - Initial Assessment Questions 1. DIARRHEA SEVERITY: How bad is the diarrhea? How many more stools have you had in the past 24 hours than normal?      Certain foods make it worse such as grits, peaches, applesauce. She states the diarrhea makes her have about 2 episodes once it gets started.  2. ONSET: When did the diarrhea begin?      1-2 weeks. She states it comes and goes.  3. STOOL DESCRIPTION:  How loose or watery is the diarrhea? What is the stool color? Is there any blood or mucous in the stool?     Watery and dark green. Denies blood and mucous in stool.  4. VOMITING: Are you also vomiting? If Yes, ask: How many times in the  past 24 hours?      No.  5. ABDOMEN PAIN: Are you having any abdomen pain? If Yes, ask: What does it feel like? (e.g., crampy, dull, intermittent, constant)      Sometimes abdominal pain, cramping precedes diarrhea stool.  6. ABDOMEN PAIN SEVERITY: If present, ask: How bad is the pain?  (e.g., Scale 1-10; mild, moderate, or severe)     No pain present at this time.  7. ORAL INTAKE: If vomiting, Have you been able to drink liquids? How much liquids have you had in the past 24 hours?     N/A.  8. HYDRATION: Any signs of dehydration? (e.g., dry mouth [not just dry lips], too weak to stand, dizziness, new weight loss) When did you last urinate?     Patient states she drinks hot lemon juice and a bottle and half of water . She thinks in a day she drinks about 2 bottles of water . Patient states she is making urine, and urinating daily and has been urinating recently. RN advised to increase water  intake.  9. EXPOSURE: Have you traveled to a foreign country recently? Have you been exposed to anyone with diarrhea? Could you have eaten any food that was spoiled?     No.  10. ANTIBIOTIC USE: Are you taking antibiotics now or have you taken antibiotics in the past 2 months?       No.  11. OTHER SYMPTOMS: Do you  have any other symptoms? (e.g., fever, blood in stool)       No fever.  04/04/24 last OV with PCP she states she started back taking Metformin  twice daily and was told to discontinue Glipizide . Patient reports her blood sugar readings have been 145 in the morning and  155 at night. As well as having the diarrhea, she would like to address discontinuing metformin  and going back on glipizide .  Protocols used: [facility]

## 2024-05-20 ENCOUNTER — Ambulatory Visit: Payer: Self-pay

## 2024-05-20 ENCOUNTER — Ambulatory Visit: Admitting: Family Medicine

## 2024-05-20 NOTE — Telephone Encounter (Signed)
Patient informed of the instructions as below.  

## 2024-05-20 NOTE — Telephone Encounter (Signed)
 Unable to leave a message due to voicemail being full.

## 2024-05-20 NOTE — Telephone Encounter (Signed)
 FYI Only or Action Required?: FYI only for provider: appointment scheduled on 05/23/24.  Patient was last seen in primary care on 04/04/2024 by Ozell Heron HERO, MD.  Called Nurse Triage reporting Appointment (Reschedule).  Triage Disposition: See PCP Within 2 Weeks  Patient/caregiver understands and will follow disposition?: Yes          Copied from CRM [id number]. Topic: Clinical - Red Word Triage >> May 19, 2024  2:35 PM Carlyon D wrote: Red Word that prompted transfer to Nurse Triage: pt put on more metFORMIN  (GLUCOPHAGE -XR) 500 MG 24 hr tablet twice a day,  pt has been having  diarrhea and states sugar is staying high  and wants to be put back on her old med. Pt is worried she is going to get dehydrated due to use the bathroom to much >> May 20, 2024  8:16 AM Fonda T wrote: Pt calling, requesting to reschedule appt to next week, previous appt scheduled by NT, for elevated sugars, and diarrhea.  Reason for Disposition  Requesting regular office appointment    Pt calling to reschedule -- denies any worsening sx since last NT encounter yesterday. Strongly advised UC/ED for worsening sx over the weekend.  Answer Assessment - Initial Assessment Questions 1. REASON FOR CALL or QUESTION: What is your reason for calling today? or How can I best     Calling to reschedule appt scheduled for today, denies any changes since last triage yesterday 2. CALLER: Document the source of call. (e.g., laboratory staff, caregiver or patient).     patient  Protocols used: PCP Call - No Triage-A-AH, Information Only Call - No Triage-A-AH

## 2024-05-20 NOTE — Telephone Encounter (Signed)
Appt on 11/24

## 2024-05-20 NOTE — Telephone Encounter (Signed)
 Please instruct patient to reduce the metformin  to once a day to see if the diarrhea improves.

## 2024-05-23 ENCOUNTER — Ambulatory Visit (INDEPENDENT_AMBULATORY_CARE_PROVIDER_SITE_OTHER): Admitting: Family Medicine

## 2024-05-23 ENCOUNTER — Encounter: Payer: Self-pay | Admitting: Family Medicine

## 2024-05-23 ENCOUNTER — Telehealth: Payer: Self-pay | Admitting: *Deleted

## 2024-05-23 VITALS — BP 144/80 | HR 91 | Temp 97.6°F | Ht 62.0 in | Wt 241.5 lb

## 2024-05-23 DIAGNOSIS — E66813 Obesity, class 3: Secondary | ICD-10-CM

## 2024-05-23 DIAGNOSIS — E1122 Type 2 diabetes mellitus with diabetic chronic kidney disease: Secondary | ICD-10-CM

## 2024-05-23 DIAGNOSIS — Z7985 Long-term (current) use of injectable non-insulin antidiabetic drugs: Secondary | ICD-10-CM | POA: Diagnosis not present

## 2024-05-23 DIAGNOSIS — N1832 Chronic kidney disease, stage 3b: Secondary | ICD-10-CM

## 2024-05-23 DIAGNOSIS — E1129 Type 2 diabetes mellitus with other diabetic kidney complication: Secondary | ICD-10-CM

## 2024-05-23 DIAGNOSIS — Z6841 Body Mass Index (BMI) 40.0 and over, adult: Secondary | ICD-10-CM | POA: Diagnosis not present

## 2024-05-23 DIAGNOSIS — I10 Essential (primary) hypertension: Secondary | ICD-10-CM

## 2024-05-23 DIAGNOSIS — K529 Noninfective gastroenteritis and colitis, unspecified: Secondary | ICD-10-CM | POA: Diagnosis not present

## 2024-05-23 MED ORDER — OZEMPIC (0.25 OR 0.5 MG/DOSE) 2 MG/3ML ~~LOC~~ SOPN: 0.2500 mg | PEN_INJECTOR | SUBCUTANEOUS | 2 refills | Status: DC

## 2024-05-23 MED ORDER — SPIRONOLACTONE 25 MG PO TABS
25.0000 mg | ORAL_TABLET | Freq: Every day | ORAL | 0 refills | Status: AC
Start: 2024-05-23 — End: ?

## 2024-05-23 NOTE — Telephone Encounter (Unsigned)
 Copied from CRM #[id number]. Topic: Clinical - Medication Question >> May 23, 2024  3:02 PM Brittany M wrote: Reason for CRM: Medication for blood sugar- Patient spoke with husband and he does not want her to change and wants to stay on the one she has been taking.

## 2024-05-23 NOTE — Progress Notes (Signed)
 Established Patient Office Visit  Subjective   Patient ID: Doris Jackson, female    DOB: [birth year]  Age: 83 y.o. MRN: [id number]  Chief Complaint  Patient presents with   Diarrhea    X2 weeks, did not begin taking Metformin  once a day as instructed 3 days ago as she felt this would not help and make sugar higher   Anorexia    Loss of appetite x2 weeks   Hyperglycemia    Patient states blood sugar has been elevated with readings of 145+    Diarrhea   Hyperglycemia  Discussed the use of AI scribe software for clinical note transcription with the patient, who gave verbal consent to proceed.  History of Present Illness   Doris Jackson is an 83 year old female with type 2 diabetes and hypertension who presents with concerns about medication side effects and blood sugar management.  She experiences diarrhea, which she attributes to metformin . Her blood sugar levels are around 145 mg/dL in the mornings and can rise to 190 mg/dL postprandially. She has tried glipizide , which caused hypoglycemia, and Jardiance , which caused malaise. Januvia  was effective but difficult to obtain. She is currently on Actos , which she believes contributes to weight gain. Her past A1c was 6.4% in October, with occasional low blood sugar on metformin .  Her blood pressure is often high, around 150 mmHg. She takes hydrochlorothiazide  12.5 mg, metoprolol  50 mg, and telmisartan  40 mg. She experienced edema with amlodipine  and is not using Lasix  regularly. She has not tried spironolactone .  She eats instant oatmeal and avoids sweets. She cooks for her family and manages her daughter's dyslexia. She is concerned about her weight and mentions recent weight loss. She is interested in consulting a gastroenterologist for ongoing gastrointestinal issues, including dark green, watery stools and occasional abdominal discomfort.        Current Outpatient Medications  Medication Instructions   acetaminophen  (TYLENOL ) 500  mg, Every 6 hours PRN   ammonium lactate  (AMLACTIN DAILY) 12 % lotion 1 Application, Topical, As needed   benzonatate  (TESSALON ) 200 mg, 3 times daily PRN   Blood Glucose Monitoring Suppl (ONETOUCH VERIO REFLECT) w/Device KIT USE UP TO FOUR TIMES DAILY AS DIRECTED.   Blood Pressure Monitoring (BLOOD PRESSURE CUFF) MISC 1 each, Does not apply, Daily   cyanocobalamin  2,000 mcg, Daily   diclofenac  Sodium (VOLTAREN ) 2 g, Topical, 4 times daily   escitalopram  (LEXAPRO ) 5 mg, Oral, Daily   famotidine  (PEPCID ) 20 mg, Oral, Daily at bedtime   fexofenadine (ALLEGRA) 180 mg, Daily PRN   fluticasone  (FLONASE ) 50 MCG/ACT nasal spray 1 spray, Each Nare, Daily   furosemide  (LASIX ) 20 mg, Oral, Daily PRN   glucose blood (ONETOUCH VERIO) test strip USE STRIP TO CHECK GLUCOSE 2 TO 4 TIMES DAILY AS NEEDED   hydrochlorothiazide  (HYDRODIURIL ) 12.5 mg, Oral, Daily   HYDROcodone -acetaminophen  (NORCO) 5-325 MG tablet 1 tablet, Oral, Daily PRN   iron  polysaccharides (FERREX 150) 150 mg, Oral, Daily   Lancets 30G MISC USE TWICE DAILY AS DIRECTED FOR GLUCOSE TESTING AND MONITORING   Lancets MISC Use as directed   meclizine  (ANTIVERT ) 12.5 MG tablet TAKE 1 TABLET BY MOUTH THREE TIMES DAILY AS NEEDED FOR DIZZINESS   metFORMIN  (GLUCOPHAGE -XR) 500 mg, Oral, 2 times daily   metoprolol  succinate (TOPROL -XL) 50 mg, Oral, Daily, Take with or immediately following a meal.   ONE TOUCH LANCETS MISC Test twice daily.   OneTouch Delica Lancets 33G MISC Use as directed once a day  Ozempic  (0.25 or 0.5 MG/DOSE) 0.25 mg, Subcutaneous, Weekly   pantoprazole  (PROTONIX ) 20 mg, Oral, Daily   pioglitazone  (ACTOS ) 45 mg, Oral, Daily   pravastatin  (PRAVACHOL ) 40 mg, Oral, Daily   prednisoLONE  acetate (PRED FORTE ) 1 % ophthalmic suspension 1 drop, See admin instructions   Probiotic Product (PROBIOTIC-10 PO) 1 capsule, Daily   senna-docusate (SENOKOT-S) 8.6-50 MG tablet 1 tablet, Oral, Daily at bedtime   spironolactone  (ALDACTONE ) 25  mg, Oral, Daily   telmisartan  (MICARDIS ) 40 mg, Oral, Daily   vitamin C 1,000 mg, Daily   Vitamin D  4,000 Units, Daily    Patient Active Problem List   Diagnosis Date Noted   Benign paroxysmal positional vertigo 11/08/2023   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/2022   Anxiety    S/P cervical spinal fusion 09/22/2019   GERD (gastroesophageal reflux disease) 09/14/2019   Spinal stenosis in cervical region 09/14/2019   Numbness and tingling 09/14/2019   Status post cataract extraction and insertion of intraocular lens of left eye 09/04/2019   Spinal stenosis of lumbar region with neurogenic claudication 08/24/2019   Persistent cough 06/06/2019   History of Descemet membrane endothelial keratoplasty (DMEK) 01/04/2019   Status post cataract extraction and insertion of intraocular lens of right eye 01/04/2019   Senile nuclear sclerosis 01/02/2019   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   Anatomical narrow angle, 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   Obesity, morbid, BMI 40.0-49.9 (HCC) 04/19/2018   Cataract    DM (diabetes mellitus) type II controlled with renal manifestation (HCC) 06/17/2010   Dyslipidemia 06/17/2010   Microcytic anemia 06/17/2010   Essential hypertension 06/17/2010   Allergic rhinitis 06/17/2010   Osteoarthritis 06/17/2010   LOW BACK PAIN 06/17/2010   BREAST CANCER, HX OF 06/17/2010     Review of Systems  Gastrointestinal:  Positive for diarrhea.  All other systems reviewed and are negative.     Objective:     BP (!) 144/80   Pulse 91   Temp 97.6 F (36.4 C) (Oral)   Ht 5' 2 (1.575 m)   Wt 241 lb 8 oz (109.5 kg)   SpO2 98%   BMI 44.17 kg/m    Physical Exam Vitals reviewed.  Constitutional:      Appearance: Normal appearance. She is morbidly obese.  Cardiovascular:     Pulses: Normal pulses.  Pulmonary:     Effort: Pulmonary effort is normal.  Musculoskeletal:     Right lower leg: Edema (2+ edema BL legs to at least the knees) present.     Left  lower leg: Edema present.  Neurological:     Mental Status: She is alert and oriented to person, place, and time.  Psychiatric:        Mood and Affect: Mood normal.        Behavior: Behavior normal.      No results found for any visits on 05/23/24.    The ASCVD Risk score (Arnett DK, et al., 2019) failed to calculate for the following reasons:   The 2019 ASCVD risk score is only valid for ages 61 to 38    Assessment & Plan:  DM (diabetes mellitus) type II controlled with renal manifestation (HCC) -     Ambulatory referral to Endocrinology -     Ozempic  (0.25 or 0.5 MG/DOSE); Inject 0.25 mg into the skin once a week.  Dispense: 3 mL; Refill: 2 -     AMB Referral [facility] Care Management  Class 3 severe obesity with serious comorbidity and body mass index (  BMI) of 40.0 to 44.9 in adult, unspecified obesity type (HCC)  Stage 3b chronic kidney disease (HCC)  Chronic diarrhea -     Ambulatory referral to Gastroenterology  Essential hypertension -     Spironolactone ; Take 1 tablet (25 mg total) by mouth daily.  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 Metformin  caused diarrhea and is ineffective. Previous trials of glipizide  and Jardiance  were not well tolerated. Januvia  was discontinued due to cost and access issues. Ozempic  is considered superior for its benefits in lowering blood sugars, protecting kidneys, and aiding weight loss. Kidney function is well-managed with creatinine around 1.3, indicating no significant decline over the past four years. - Discontinued metformin  due to diarrhea. - Initiated Ozempic  at 0.25 mg weekly, with plans to titrate dose as tolerated. - Referred to pharmacist Jon for assistance with Ozempic  administration. - Continue Actos  for blood sugar control. -  Referred to endocrinologist for further diabetes management.  Essential hypertension Blood pressure remains elevated at 150 mmHg. Current regimen includes hydrochlorothiazide , metoprolol , and telmisartan . Amlodipine  was discontinued due to swelling. Spironolactone  is considered as an additional antihypertensive agent, with monitoring of kidney function due to potential interactions with hydrochlorothiazide . - Ensured telmisartan  is at 40 mg daily. - Initiated spironolactone  25 mg daily. - Will monitor kidney function at next appointment in January. - Continue hydrochlorothiazide  and metoprolol  as prescribed.  Obesity, class 3 Weight management is challenging. Previous medications like glipizide  and Actos  may contribute to weight gain. Ozempic  is anticipated to aid in weight loss by reducing appetite and slowing gastric emptying. - Initiated Ozempic  for weight management and blood sugar control.  Chronic diarrhea Diarrhea is likely related to metformin  use. Probiotics may also contribute to symptoms. - Discontinued metformin . - Consider discontinuing probiotics if diarrhea persists.         No follow-ups on file.    Heron CHRISTELLA Sharper, MD

## 2024-05-23 NOTE — Telephone Encounter (Signed)
 Ok to put the glipizide  back on her medication list.

## 2024-05-23 NOTE — Telephone Encounter (Signed)
 Yes this is ok

## 2024-05-23 NOTE — Telephone Encounter (Signed)
 Spoke with Grayson Cassis, pharmacist and informed her of the message below.

## 2024-05-23 NOTE — Telephone Encounter (Signed)
 Copied from CRM #[id number]. Topic: Clinical - Prescription Issue >> May 23, 2024 12:30 PM Doris Jackson HERO wrote: Reason for CRM: Pharmacist calling because the spironolactone  (ALDACTONE ) 25 MG tablet and telmisartan  (MICARDIS ) 40 MG tablet taken together can increase potassium levels and she wanted to know if this is ok and if the patient is getting labs checked regularly.Call back number is [phone]

## 2024-05-24 ENCOUNTER — Other Ambulatory Visit: Payer: Self-pay | Admitting: Family Medicine

## 2024-05-24 DIAGNOSIS — F419 Anxiety disorder, unspecified: Secondary | ICD-10-CM

## 2024-05-24 DIAGNOSIS — I10 Essential (primary) hypertension: Secondary | ICD-10-CM

## 2024-05-24 DIAGNOSIS — Z1231 Encounter for screening mammogram for malignant neoplasm of breast: Secondary | ICD-10-CM | POA: Diagnosis not present

## 2024-05-24 LAB — HM MAMMOGRAPHY

## 2024-05-24 NOTE — Telephone Encounter (Signed)
 Left a detailed message with the information below at the patient's cell number.  Rx added back to the medication list also.

## 2024-05-25 ENCOUNTER — Telehealth: Payer: Self-pay | Admitting: *Deleted

## 2024-05-25 NOTE — Progress Notes (Signed)
 Care Guide Pharmacy Note  05/25/2024 Name: RAUSHANAH OSMUNDSON MRN: [id number] DOB: 14-Sep-1940  Referred By: Ozell Heron CHRISTELLA, MD Reason for referral: Call Attempt #1 and Complex Care Management (Outreach to schedule referral with pharmacist )   Doris Jackson is a 83 y.o. year old female who is a primary care patient of Ozell Heron CHRISTELLA, MD.  Delayne CHRISTELLA Moats was referred to the pharmacist for assistance related to: DMII  An unsuccessful telephone outreach was attempted today to contact the patient who was referred to the pharmacy team for assistance with medication management. Additional attempts will be made to contact the patient.  Thedford Franks, CMA [facility], [facility] Guide Direct Dial: [phone]  Fax: [phone] Website: [email or web address]

## 2024-05-30 ENCOUNTER — Ambulatory Visit: Payer: Self-pay | Admitting: Family Medicine

## 2024-05-30 DIAGNOSIS — J3089 Other allergic rhinitis: Secondary | ICD-10-CM | POA: Diagnosis not present

## 2024-05-30 DIAGNOSIS — J3081 Allergic rhinitis due to animal (cat) (dog) hair and dander: Secondary | ICD-10-CM | POA: Diagnosis not present

## 2024-05-30 DIAGNOSIS — J301 Allergic rhinitis due to pollen: Secondary | ICD-10-CM | POA: Diagnosis not present

## 2024-05-30 NOTE — Progress Notes (Signed)
 Care Guide Pharmacy Note  05/30/2024 Name: [name] MRN: [id number] DOB: Feb 07, 1941  Referred By: Ozell Heron CHRISTELLA, MD Reason for referral: Call Attempt #1 and Complex Care Management (Outreach to schedule referral with pharmacist )   Doris Jackson is a 83 y.o. year old female who is a primary care patient of Ozell Heron CHRISTELLA, MD.  Delayne CHRISTELLA Moats was referred to the pharmacist for assistance related to: DMII  A second unsuccessful telephone outreach was attempted today to contact the patient who was referred to the pharmacy team for assistance with medication management. Additional attempts will be made to contact the patient.  Thedford Franks, CMA [place]  [facility], [facility] Guide Direct Dial: [phone]  Fax: [phone] Website: [email or web address]

## 2024-05-31 NOTE — Progress Notes (Signed)
 Care Guide Pharmacy Note  05/31/2024 Name: Doris Jackson MRN: [id number] DOB: 16-Apr-1941  Referred By: Ozell Heron CHRISTELLA, MD Reason for referral: Call Attempt #1 and Complex Care Management (Outreach to schedule referral with pharmacist )   Doris Jackson is a 83 y.o. year old female who is a primary care patient of Ozell Heron CHRISTELLA, MD.  Doris Jackson was referred to the pharmacist for assistance related to: DMII  A third unsuccessful telephone outreach was attempted today to contact the patient who was referred to the pharmacy team for assistance with medication management. The Population Health team is pleased to engage with this patient at any time in the future upon receipt of referral and should he/she be interested in assistance from the Population Health team.  Thedford Franks, CMA [facility]  [facility], [facility] Guide Direct Dial: [phone]  Fax: [phone] Website: [email or web address]

## 2024-06-09 ENCOUNTER — Other Ambulatory Visit: Payer: Self-pay | Admitting: Family Medicine

## 2024-06-09 DIAGNOSIS — M48061 Spinal stenosis, lumbar region without neurogenic claudication: Secondary | ICD-10-CM

## 2024-06-09 NOTE — Telephone Encounter (Signed)
 Copied from CRM #[id number]. Topic: Clinical - Medication Refill >> Jun 09, 2024 10:51 AM Charlet HERO wrote: Medication: HYDROcodone -acetaminophen  (NORCO) 5-325 MG tablet  Has the patient contacted their pharmacy? Yes (0 refill  This is the patient's preferred pharmacy:  [facility] [address] [place] - [address] [place] [address] Phone: [phone] Fax: [phone]  Is this the correct pharmacy for this prescription? Yes If no, delete pharmacy and type the correct one.   Has the prescription been filled recently? Yes  Is the patient out of the medication? Yes  Has the patient been seen for an appointment in the last year OR does the patient have an upcoming appointment? Yes  Can we respond through MyChart? No  Agent: Please be advised that Rx refills may take up to 3 business days. We ask that you follow-up with your pharmacy.

## 2024-06-13 ENCOUNTER — Other Ambulatory Visit: Payer: Self-pay | Admitting: Family Medicine

## 2024-06-13 DIAGNOSIS — H8110 Benign paroxysmal vertigo, unspecified ear: Secondary | ICD-10-CM

## 2024-06-13 MED ORDER — HYDROCODONE-ACETAMINOPHEN 5-325 MG PO TABS: 1.0000 | ORAL_TABLET | Freq: Every day | ORAL | 0 refills | Status: DC | PRN

## 2024-06-13 NOTE — Telephone Encounter (Signed)
 Patient called to follow up on her medication refill. When medication has been sent to the Pharmacy, please call back the patient to confirm.

## 2024-06-14 DIAGNOSIS — J301 Allergic rhinitis due to pollen: Secondary | ICD-10-CM | POA: Diagnosis not present

## 2024-06-14 DIAGNOSIS — J3081 Allergic rhinitis due to animal (cat) (dog) hair and dander: Secondary | ICD-10-CM | POA: Diagnosis not present

## 2024-06-14 DIAGNOSIS — J3089 Other allergic rhinitis: Secondary | ICD-10-CM | POA: Diagnosis not present

## 2024-06-29 ENCOUNTER — Ambulatory Visit: Payer: Self-pay

## 2024-06-29 ENCOUNTER — Telehealth: Payer: Self-pay | Admitting: Family Medicine

## 2024-06-29 NOTE — Telephone Encounter (Addendum)
 FYI Only or Action Required?: Action required by provider: medication refill request and clinical question for provider.  Patient was last seen in primary care on 05/23/2024 by Ozell Heron HERO, MD.  Called Nurse Triage reporting Back Pain.  Symptoms began several days ago.  Interventions attempted: OTC medications: tylenol , norco, heating pad.  Symptoms are: unchanged.  Triage Disposition: See PCP When Office is Open (Within 3 Days)  Patient/caregiver understands and will follow disposition?: No, wishes to speak with PCP   Copied from CRM #[id number]. Topic: Clinical - Red Word Triage >> Jun 29, 2024 11:55 AM Drema MATSU wrote: Red Word that prompted transfer to Nurse Triage: Patient is in pain all around her waist. She feels like her body is starting to stiff up. She said that she has a hard time in the morning with it.  Need refill Tylenol , heating Reason for Disposition  [1] MODERATE back pain (e.g., interferes with normal activities) AND [2] present > 3 days  Answer Assessment - Initial Assessment Questions Patient declines appt and requests cyclobenzaprine  10mg .  Advised call back or ED if symptoms worsens. Patient verbalized understanding.  1. ONSET: When did the pain begin? (e.g., minutes, hours, days)     Years ago 2. LOCATION: Where does it hurt? (upper, mid or lower back)     Lower back pain 3. SEVERITY: How bad is the pain?  (e.g., Scale 1-10; mild, moderate, or severe)     8/10;  heating pad patient reports has not taken any pain meds, takes tylenol  or half of norco; helps with pain 4. PATTERN: Is the pain constant? (e.g., yes, no; constant, intermittent)      Comes and goes 5. RADIATION: Does the pain shoot into your legs or somewhere else?     no 6. CAUSE:  What do you think is causing the back pain?      Chronic; spinal stenosis 7. BACK OVERUSE:  Any recent lifting of heavy objects, strenuous work or exercise?     No; hurting when waking up 8.  MEDICINES: What have you taken so far for the pain? (e.g., nothing, acetaminophen , NSAIDS)     tylenol  9. NEUROLOGIC SYMPTOMS: Do you have any weakness, numbness, or problems with bowel/bladder control?     denies 10. OTHER SYMPTOMS: Do you have any other symptoms? (e.g., fever, abdomen pain, burning with urination, blood in urine) Denies fever chills n/v, abd pain, painful urination  Protocols used: Back Pain-A-AH  Specialists scheduled patient for 07/07/24.

## 2024-06-29 NOTE — Telephone Encounter (Signed)
 Spoke with the patient and advised she seek treatment at an urgent care for evaluation as we do not have any openings.  Patient stated she will be fine until the next visit and if not agreed to go to an urgent care.  Also patient is aware a message will be sent to PCP to address on 07/01/2024 for a refill request on Cyclobenzaprine .

## 2024-06-29 NOTE — Telephone Encounter (Unsigned)
 Copied from CRM [id number]. Topic: Clinical - Medication Refill >> Jun 29, 2024 11:48 AM Drema MATSU wrote: Medication: CYCLOBENZAPRINE  10 mg  Has the patient contacted their pharmacy? Yes (Agent: If no, request that the patient contact the pharmacy for the refill. If patient does not wish to contact the pharmacy document the reason why and proceed with request.) call provider  (Agent: If yes, when and what did the pharmacy advise?)  This is the patient's preferred pharmacy:  [facility] [address], [place] - [address] [place] [address] Phone: [phone] Fax: [phone]  Is this the correct pharmacy for this prescription? Yes If no, delete pharmacy and type the correct one.   Has the prescription been filled recently? No  Is the patient out of the medication? Yes prescription bottle is old   Has the patient been seen for an appointment in the last year OR does the patient have an upcoming appointment? Yes  Can we respond through MyChart? No  Agent: Please be advised that Rx refills may take up to 3 business days. We ask that you follow-up with your pharmacy.

## 2024-07-01 ENCOUNTER — Telehealth: Payer: Self-pay

## 2024-07-01 NOTE — Telephone Encounter (Signed)
 Copied from CRM [id number]. Topic: Clinical - Prescription Issue >> Jul 01, 2024  1:26 PM Nessti S wrote: Reason for CRM: pt returning call to cma concerning meds. Relayed pcp message but pt asked is she able to take them at different times. She would like a call back soon as possible.

## 2024-07-01 NOTE — Telephone Encounter (Signed)
 Pt needs in person appt due to the multiple symptoms.

## 2024-07-01 NOTE — Telephone Encounter (Signed)
 Patient informed of the message below and has an appt previously scheduled on 1/8

## 2024-07-01 NOTE — Telephone Encounter (Signed)
 Left a detailed message with the information below at the patient's cell number.

## 2024-07-01 NOTE — Telephone Encounter (Signed)
 Please ask the patient if she is still taking the meclizine -- she should not be on both medications at the same time

## 2024-07-05 ENCOUNTER — Ambulatory Visit: Admitting: Family Medicine

## 2024-07-07 ENCOUNTER — Telehealth: Payer: Self-pay | Admitting: *Deleted

## 2024-07-07 ENCOUNTER — Encounter: Payer: Self-pay | Admitting: Family Medicine

## 2024-07-07 ENCOUNTER — Ambulatory Visit: Admitting: Family Medicine

## 2024-07-07 VITALS — BP 118/62 | HR 65 | Temp 97.7°F | Ht 62.0 in | Wt 240.1 lb

## 2024-07-07 DIAGNOSIS — N1832 Chronic kidney disease, stage 3b: Secondary | ICD-10-CM

## 2024-07-07 DIAGNOSIS — I10 Essential (primary) hypertension: Secondary | ICD-10-CM

## 2024-07-07 DIAGNOSIS — Z7984 Long term (current) use of oral hypoglycemic drugs: Secondary | ICD-10-CM

## 2024-07-07 DIAGNOSIS — M48061 Spinal stenosis, lumbar region without neurogenic claudication: Secondary | ICD-10-CM

## 2024-07-07 DIAGNOSIS — Z6841 Body Mass Index (BMI) 40.0 and over, adult: Secondary | ICD-10-CM | POA: Diagnosis not present

## 2024-07-07 DIAGNOSIS — K529 Noninfective gastroenteritis and colitis, unspecified: Secondary | ICD-10-CM | POA: Diagnosis not present

## 2024-07-07 DIAGNOSIS — E1129 Type 2 diabetes mellitus with other diabetic kidney complication: Secondary | ICD-10-CM

## 2024-07-07 DIAGNOSIS — E1122 Type 2 diabetes mellitus with diabetic chronic kidney disease: Secondary | ICD-10-CM

## 2024-07-07 LAB — POCT GLYCOSYLATED HEMOGLOBIN (HGB A1C): Hemoglobin A1C: 6.9 % — AB (ref 4.0–5.6)

## 2024-07-07 MED ORDER — HYDROCODONE-ACETAMINOPHEN 5-325 MG PO TABS: 1.0000 | ORAL_TABLET | Freq: Every day | ORAL | 0 refills | Status: AC | PRN

## 2024-07-07 NOTE — Progress Notes (Unsigned)
 "  Established Patient Office Visit  Subjective   Patient ID: Doris Jackson, female    DOB: February 12, 1941  Age: 84 y.o. MRN: [id number]  Chief Complaint  Patient presents with   Medical Management of Chronic Issues    HPI Discussed the use of AI scribe software for clinical note transcription with the patient, who gave verbal consent to proceed.  History of Present Illness     Current Outpatient Medications  Medication Instructions   acetaminophen  (TYLENOL ) 500 mg, Every 6 hours PRN   ammonium lactate  (AMLACTIN DAILY) 12 % lotion 1 Application, Topical, As needed   Blood Glucose Monitoring Suppl (ONETOUCH VERIO REFLECT) w/Device KIT USE UP TO FOUR TIMES DAILY AS DIRECTED.   Blood Pressure Monitoring (BLOOD PRESSURE CUFF) MISC 1 each, Does not apply, Daily   cyanocobalamin  2,000 mcg, Daily   escitalopram  (LEXAPRO ) 5 mg, Oral, Daily   famotidine  (PEPCID ) 20 mg, Oral, Daily at bedtime   fexofenadine (ALLEGRA) 180 mg, Daily PRN   fluticasone  (FLONASE ) 50 MCG/ACT nasal spray 1 spray, Each Nare, Daily   furosemide  (LASIX ) 20 mg, Oral, Daily PRN   glipiZIDE  (GLUCOTROL  XL) 5 mg, Oral, Daily PRN   glucose blood (ONETOUCH VERIO) test strip USE STRIP TO CHECK GLUCOSE 2 TO 4 TIMES DAILY AS NEEDED   hydrochlorothiazide  (HYDRODIURIL ) 12.5 mg, Oral, Daily   HYDROcodone -acetaminophen  (NORCO) 5-325 MG tablet 1 tablet, Oral, Daily PRN   iron  polysaccharides (FERREX 150) 150 mg, Oral, Daily   Lancets 30G MISC USE TWICE DAILY AS DIRECTED FOR GLUCOSE TESTING AND MONITORING   Lancets MISC Use as directed   meclizine  (ANTIVERT ) 12.5 MG tablet TAKE 1 TABLET BY MOUTH THREE TIMES DAILY AS NEEDED FOR DIZZINESS   metFORMIN  (GLUCOPHAGE -XR) 500 mg, Oral, 2 times daily   metoprolol  succinate (TOPROL -XL) 50 mg, Oral, Daily, Take with or immediately following a meal.   ONE TOUCH LANCETS MISC Test twice daily.   OneTouch Delica Lancets 33G MISC Use as directed once a day   pioglitazone  (ACTOS ) 45 mg, Oral,  Daily   pravastatin  (PRAVACHOL ) 40 mg, Oral, Daily   prednisoLONE  acetate (PRED FORTE ) 1 % ophthalmic suspension 1 drop, See admin instructions   Probiotic Product (PROBIOTIC-10 PO) 1 capsule, Daily   spironolactone  (ALDACTONE ) 25 mg, Oral, Daily   telmisartan  (MICARDIS ) 40 mg, Oral, Daily   Vitamin D  4,000 Units, Daily    Patient Active Problem List   Diagnosis Date Noted   Benign paroxysmal positional vertigo 11/08/2023   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/2022   Anxiety    S/P cervical spinal fusion 09/22/2019   GERD (gastroesophageal reflux disease) 09/14/2019   Spinal stenosis in cervical region 09/14/2019   Numbness and tingling 09/14/2019   Status post cataract extraction and insertion of intraocular lens of left eye 09/04/2019   Spinal stenosis of lumbar region with neurogenic claudication 08/24/2019   Persistent cough 06/06/2019   History of Descemet membrane endothelial keratoplasty (DMEK) 01/04/2019   Status post cataract extraction and insertion of intraocular lens of right eye 01/04/2019   Senile nuclear sclerosis 01/02/2019   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   Anatomical narrow angle, 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   Obesity, morbid, BMI 40.0-49.9 (HCC) 04/19/2018   Cataract    DM (diabetes mellitus) type II controlled with renal manifestation (HCC) 06/17/2010   Dyslipidemia 06/17/2010   Microcytic anemia 06/17/2010   Essential hypertension 06/17/2010   Allergic rhinitis 06/17/2010   Osteoarthritis 06/17/2010   LOW BACK PAIN 06/17/2010   BREAST CANCER, HX OF  06/17/2010     ROS    Objective:     BP 118/62   Pulse 65   Temp 97.7 F (36.5 C) (Oral)   Ht 5' 2 (1.575 m)   Wt 240 lb 1.6 oz (108.9 kg)   SpO2 99%   BMI 43.91 kg/m  {Vitals History (Optional):23777}  Physical Exam   Results for orders placed or performed in visit on 07/07/24  POC HgB A1c  Result Value Ref Range   Hemoglobin A1C 6.9 (A) 4.0 - 5.6 %   HbA1c POC (<> result, manual  entry)     HbA1c, POC (prediabetic range)     HbA1c, POC (controlled diabetic range)      {Labs (Optional):23779}  The ASCVD Risk score (Arnett DK, et al., 2019) failed to calculate for the following reasons:   The 2019 ASCVD risk score is only valid for ages 55 to 79   * - Cholesterol units were assumed    Assessment & Plan:  Type 2 diabetes mellitus with other diabetic kidney complication (HCC) -     POCT glycosylated hemoglobin (Hb A1C) -     Collection capillary blood specimen  Degenerative lumbar spinal stenosis -     HYDROcodone -Acetaminophen ; Take 1 tablet by mouth daily as needed for moderate pain (pain score 4-6).  Dispense: 30 tablet; Refill: 0 -     Ambulatory referral to Physical Therapy  Body mass index (BMI) 40.0-44.9, adult (HCC)  Stage 3b chronic kidney disease (HCC)  Chronic diarrhea -     Ambulatory referral to Gastroenterology  Essential hypertension     Return in about 3 months (around 10/05/2024) for AWV.    Heron CHRISTELLA Sharper, MD "

## 2024-07-07 NOTE — Telephone Encounter (Signed)
 Copied from CRM #[id number]. Topic: General - Dropped Call >> Jul 07, 2024  1:33 PM Brittany M wrote: Reason for CRM: patient was calling in to confirm appt today for 2:40- call was dropped- tried calling back twice- vm is full

## 2024-07-11 ENCOUNTER — Other Ambulatory Visit: Payer: Self-pay | Admitting: Family Medicine

## 2024-07-11 DIAGNOSIS — H8110 Benign paroxysmal vertigo, unspecified ear: Secondary | ICD-10-CM

## 2024-07-13 ENCOUNTER — Encounter: Payer: Self-pay | Admitting: Podiatry

## 2024-07-13 ENCOUNTER — Ambulatory Visit: Admitting: Podiatry

## 2024-07-13 DIAGNOSIS — B351 Tinea unguium: Secondary | ICD-10-CM | POA: Diagnosis not present

## 2024-07-13 DIAGNOSIS — M79675 Pain in left toe(s): Secondary | ICD-10-CM

## 2024-07-13 DIAGNOSIS — M79674 Pain in right toe(s): Secondary | ICD-10-CM | POA: Diagnosis not present

## 2024-07-13 DIAGNOSIS — L84 Corns and callosities: Secondary | ICD-10-CM

## 2024-07-13 NOTE — Progress Notes (Addendum)
 This patient returns to my office for at risk foot care.  This patient requires this care by a professional since this patient will be at risk due to having diabetes.  This patient is unable to cut nails herself since the patient cannot reach her nails.These nails are painful walking and wearing shoes.  This patient presents for at risk foot care today.  General Appearance  Alert, conversant and in no acute stress.  Vascular  Dorsalis pedis and posterior tibial  pulses are  weakly palpable  bilaterally.  Capillary return is within normal limits  bilaterally. Temperature is within normal limits  bilaterally.  Excessive swelling.  Neurologic  Senn-Weinstein monofilament wire test within diminished. bilaterally. Muscle power within normal limits bilaterally.  Nails Thick disfigured discolored nails with subungual debris  from hallux to fifth toes bilaterally. No evidence of bacterial infection or drainage bilaterally.  Orthopedic  No limitations of motion  feet .  No crepitus or effusions noted.  No bony pathology or digital deformities noted.  Skin  normotropic skin with no porokeratosis noted bilaterally.  No signs of infections or ulcers noted.   Heel callus noted left foot.  Onychomycosis  Pain in right toes  Pain in left toes  Callus left foot.  Consent was obtained for treatment procedures.   Mechanical debridement of nails 1-5  bilaterally performed with a nail nipper.  Filed with dremel without incident.  To send paperwork for diabetic shoes to her home.   Return office visit   3 months                   Told patient to return for periodic foot care and evaluation due to potential at risk complications.   Cordella Bold DPM

## 2024-07-15 ENCOUNTER — Other Ambulatory Visit: Payer: Self-pay | Admitting: Family Medicine

## 2024-07-15 ENCOUNTER — Telehealth: Payer: Self-pay | Admitting: *Deleted

## 2024-07-15 DIAGNOSIS — E1129 Type 2 diabetes mellitus with other diabetic kidney complication: Secondary | ICD-10-CM

## 2024-07-15 MED ORDER — ONETOUCH VERIO VI STRP: ORAL_STRIP | 1 refills | Status: DC

## 2024-07-15 MED ORDER — ONETOUCH VERIO VI STRP: ORAL_STRIP | 1 refills | Status: AC

## 2024-07-15 NOTE — Telephone Encounter (Signed)
 Copied from CRM [id number]. Topic: Clinical - Medication Refill >> Jul 15, 2024 11:13 AM Mia F wrote: Medication: glucose blood (ONETOUCH VERIO) test strip   Has the patient contacted their pharmacy? Yes (Agent: If no, request that the patient contact the pharmacy for the refill. If patient does not wish to contact the pharmacy document the reason why and proceed with request.) (Agent: If yes, when and what did the pharmacy advise?)  This is the patient's preferred pharmacy:  [facility] [address], [place] - [address] [place] [address] Phone: [phone] Fax: [phone]  Is this the correct pharmacy for this prescription? Yes If no, delete pharmacy and type the correct one.   Has the prescription been filled recently? Yes  Is the patient out of the medication? No  Has the patient been seen for an appointment in the last year OR does the patient have an upcoming appointment? Yes  Can we respond through MyChart? No  Agent: Please be advised that Rx refills may take up to 3 business days. We ask that you follow-up with your pharmacy.

## 2024-07-15 NOTE — Telephone Encounter (Signed)
 Rx done

## 2024-08-03 ENCOUNTER — Encounter: Payer: Self-pay | Admitting: Internal Medicine

## 2024-10-12 ENCOUNTER — Ambulatory Visit: Admitting: Podiatry
